# Patient Record
Sex: Female | Born: 1935 | Race: Black or African American | Hispanic: No | Marital: Married | Smoking: Never smoker
Health system: Southern US, Community
[De-identification: ages and names within clinical notes are randomized; demographics above are authoritative.]

## PROBLEM LIST (undated history)

## (undated) DIAGNOSIS — T783XXA Angioneurotic edema, initial encounter: Secondary | ICD-10-CM

## (undated) DIAGNOSIS — R0602 Shortness of breath: Secondary | ICD-10-CM

## (undated) DIAGNOSIS — I495 Sick sinus syndrome: Secondary | ICD-10-CM

## (undated) DIAGNOSIS — E041 Nontoxic single thyroid nodule: Secondary | ICD-10-CM

## (undated) DIAGNOSIS — I639 Cerebral infarction, unspecified: Secondary | ICD-10-CM

## (undated) DIAGNOSIS — M1990 Unspecified osteoarthritis, unspecified site: Secondary | ICD-10-CM

## (undated) DIAGNOSIS — I10 Essential (primary) hypertension: Secondary | ICD-10-CM

## (undated) DIAGNOSIS — IMO0001 Reserved for inherently not codable concepts without codable children: Secondary | ICD-10-CM

## (undated) DIAGNOSIS — Z5189 Encounter for other specified aftercare: Secondary | ICD-10-CM

## (undated) DIAGNOSIS — G40909 Epilepsy, unspecified, not intractable, without status epilepticus: Secondary | ICD-10-CM

## (undated) DIAGNOSIS — H3560 Retinal hemorrhage, unspecified eye: Secondary | ICD-10-CM

## (undated) DIAGNOSIS — N200 Calculus of kidney: Secondary | ICD-10-CM

## (undated) DIAGNOSIS — N189 Chronic kidney disease, unspecified: Secondary | ICD-10-CM

## (undated) DIAGNOSIS — I4891 Unspecified atrial fibrillation: Secondary | ICD-10-CM

## (undated) DIAGNOSIS — R42 Dizziness and giddiness: Secondary | ICD-10-CM

## (undated) DIAGNOSIS — D649 Anemia, unspecified: Secondary | ICD-10-CM

## (undated) DIAGNOSIS — Z950 Presence of cardiac pacemaker: Secondary | ICD-10-CM

## (undated) DIAGNOSIS — I059 Rheumatic mitral valve disease, unspecified: Secondary | ICD-10-CM

## (undated) DIAGNOSIS — E785 Hyperlipidemia, unspecified: Secondary | ICD-10-CM

## (undated) HISTORY — DX: Sick sinus syndrome: I49.5

## (undated) HISTORY — DX: Unspecified atrial fibrillation: I48.91

## (undated) HISTORY — DX: Angioneurotic edema, initial encounter: T78.3XXA

## (undated) HISTORY — DX: Unspecified osteoarthritis, unspecified site: M19.90

## (undated) HISTORY — DX: Hyperlipidemia, unspecified: E78.5

## (undated) HISTORY — DX: Anemia, unspecified: D64.9

## (undated) HISTORY — DX: Dizziness and giddiness: R42

## (undated) HISTORY — DX: Epilepsy, unspecified, not intractable, without status epilepticus: G40.909

## (undated) HISTORY — DX: Chronic kidney disease, unspecified: N18.9

## (undated) HISTORY — DX: Essential (primary) hypertension: I10

## (undated) HISTORY — DX: Cerebral infarction, unspecified: I63.9

## (undated) HISTORY — DX: Retinal hemorrhage, unspecified eye: H35.60

## (undated) HISTORY — DX: Calculus of kidney: N20.0

## (undated) HISTORY — DX: Rheumatic mitral valve disease, unspecified: I05.9

---
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
## 2001-10-30 ENCOUNTER — Encounter: Payer: Self-pay | Admitting: Emergency Medicine

## 2001-10-30 ENCOUNTER — Emergency Department (HOSPITAL_COMMUNITY): Admission: EM | Admit: 2001-10-30 | Discharge: 2001-10-30 | Payer: Self-pay | Admitting: Emergency Medicine

## 2003-07-29 ENCOUNTER — Emergency Department (HOSPITAL_COMMUNITY): Admission: EM | Admit: 2003-07-29 | Discharge: 2003-07-29 | Payer: Self-pay | Admitting: Emergency Medicine

## 2004-06-07 DIAGNOSIS — I639 Cerebral infarction, unspecified: Secondary | ICD-10-CM

## 2004-06-07 HISTORY — DX: Cerebral infarction, unspecified: I63.9

## 2004-11-16 ENCOUNTER — Ambulatory Visit (HOSPITAL_COMMUNITY): Admission: RE | Admit: 2004-11-16 | Discharge: 2004-11-16 | Payer: Self-pay | Admitting: Family Medicine

## 2004-11-28 ENCOUNTER — Inpatient Hospital Stay (HOSPITAL_COMMUNITY): Admission: EM | Admit: 2004-11-28 | Discharge: 2004-12-03 | Payer: Self-pay | Admitting: Emergency Medicine

## 2004-12-02 ENCOUNTER — Ambulatory Visit: Payer: Self-pay | Admitting: *Deleted

## 2004-12-17 ENCOUNTER — Ambulatory Visit: Payer: Self-pay | Admitting: *Deleted

## 2005-01-13 ENCOUNTER — Ambulatory Visit: Payer: Self-pay | Admitting: *Deleted

## 2005-02-24 ENCOUNTER — Ambulatory Visit: Payer: Self-pay | Admitting: *Deleted

## 2005-03-10 ENCOUNTER — Ambulatory Visit: Payer: Self-pay | Admitting: *Deleted

## 2005-03-17 ENCOUNTER — Ambulatory Visit: Payer: Self-pay | Admitting: *Deleted

## 2005-03-19 ENCOUNTER — Ambulatory Visit (HOSPITAL_COMMUNITY): Admission: RE | Admit: 2005-03-19 | Discharge: 2005-03-19 | Payer: Self-pay | Admitting: Family Medicine

## 2005-03-22 ENCOUNTER — Encounter (HOSPITAL_COMMUNITY): Admission: RE | Admit: 2005-03-22 | Discharge: 2005-04-21 | Payer: Self-pay | Admitting: *Deleted

## 2005-03-22 ENCOUNTER — Ambulatory Visit: Payer: Self-pay | Admitting: Cardiology

## 2005-03-31 ENCOUNTER — Ambulatory Visit: Payer: Self-pay | Admitting: *Deleted

## 2005-05-05 ENCOUNTER — Ambulatory Visit: Payer: Self-pay | Admitting: *Deleted

## 2005-05-21 ENCOUNTER — Emergency Department (HOSPITAL_COMMUNITY): Admission: EM | Admit: 2005-05-21 | Discharge: 2005-05-21 | Payer: Self-pay | Admitting: Emergency Medicine

## 2005-06-04 ENCOUNTER — Ambulatory Visit: Payer: Self-pay | Admitting: Cardiology

## 2005-07-06 ENCOUNTER — Ambulatory Visit: Payer: Self-pay | Admitting: *Deleted

## 2005-08-06 ENCOUNTER — Ambulatory Visit: Payer: Self-pay | Admitting: *Deleted

## 2005-09-09 ENCOUNTER — Ambulatory Visit: Payer: Self-pay | Admitting: *Deleted

## 2005-09-17 ENCOUNTER — Ambulatory Visit: Payer: Self-pay | Admitting: Cardiology

## 2005-09-17 ENCOUNTER — Ambulatory Visit (HOSPITAL_COMMUNITY): Admission: RE | Admit: 2005-09-17 | Discharge: 2005-09-17 | Payer: Self-pay | Admitting: *Deleted

## 2005-10-13 ENCOUNTER — Ambulatory Visit: Payer: Self-pay | Admitting: *Deleted

## 2005-11-16 ENCOUNTER — Ambulatory Visit: Payer: Self-pay | Admitting: *Deleted

## 2005-12-15 ENCOUNTER — Ambulatory Visit: Payer: Self-pay | Admitting: Cardiology

## 2005-12-20 ENCOUNTER — Ambulatory Visit (HOSPITAL_COMMUNITY): Admission: RE | Admit: 2005-12-20 | Discharge: 2005-12-20 | Payer: Self-pay | Admitting: Family Medicine

## 2005-12-24 ENCOUNTER — Ambulatory Visit: Payer: Self-pay | Admitting: *Deleted

## 2006-01-11 ENCOUNTER — Ambulatory Visit: Payer: Self-pay | Admitting: *Deleted

## 2006-02-16 ENCOUNTER — Ambulatory Visit: Payer: Self-pay | Admitting: Cardiology

## 2006-03-16 ENCOUNTER — Ambulatory Visit: Payer: Self-pay | Admitting: Cardiology

## 2006-03-18 ENCOUNTER — Emergency Department (HOSPITAL_COMMUNITY): Admission: EM | Admit: 2006-03-18 | Discharge: 2006-03-18 | Payer: Self-pay | Admitting: Emergency Medicine

## 2006-04-19 ENCOUNTER — Ambulatory Visit: Payer: Self-pay | Admitting: Cardiology

## 2006-05-06 ENCOUNTER — Ambulatory Visit: Payer: Self-pay | Admitting: Cardiology

## 2006-06-03 ENCOUNTER — Ambulatory Visit: Payer: Self-pay | Admitting: Cardiology

## 2006-06-24 ENCOUNTER — Ambulatory Visit: Payer: Self-pay | Admitting: Internal Medicine

## 2006-07-29 ENCOUNTER — Ambulatory Visit: Payer: Self-pay | Admitting: Cardiology

## 2006-08-06 DIAGNOSIS — T783XXA Angioneurotic edema, initial encounter: Secondary | ICD-10-CM

## 2006-08-06 HISTORY — DX: Angioneurotic edema, initial encounter: T78.3XXA

## 2006-08-30 ENCOUNTER — Ambulatory Visit: Payer: Self-pay | Admitting: *Deleted

## 2006-09-05 ENCOUNTER — Emergency Department (HOSPITAL_COMMUNITY): Admission: EM | Admit: 2006-09-05 | Discharge: 2006-09-05 | Payer: Self-pay | Admitting: Emergency Medicine

## 2006-09-09 ENCOUNTER — Ambulatory Visit: Payer: Self-pay | Admitting: Cardiology

## 2006-10-05 ENCOUNTER — Telehealth (INDEPENDENT_AMBULATORY_CARE_PROVIDER_SITE_OTHER): Payer: Self-pay | Admitting: *Deleted

## 2006-10-07 ENCOUNTER — Ambulatory Visit: Payer: Self-pay | Admitting: Cardiology

## 2006-11-08 ENCOUNTER — Ambulatory Visit: Payer: Self-pay | Admitting: Cardiology

## 2006-11-29 ENCOUNTER — Ambulatory Visit: Payer: Self-pay | Admitting: Cardiology

## 2006-12-29 ENCOUNTER — Ambulatory Visit: Payer: Self-pay | Admitting: Cardiology

## 2007-01-11 ENCOUNTER — Ambulatory Visit: Payer: Self-pay | Admitting: Cardiology

## 2007-01-16 ENCOUNTER — Ambulatory Visit (HOSPITAL_COMMUNITY): Admission: RE | Admit: 2007-01-16 | Discharge: 2007-01-16 | Payer: Self-pay | Admitting: Family Medicine

## 2007-01-26 ENCOUNTER — Ambulatory Visit: Payer: Self-pay | Admitting: Cardiology

## 2007-02-16 ENCOUNTER — Ambulatory Visit: Payer: Self-pay | Admitting: Cardiology

## 2007-03-17 ENCOUNTER — Ambulatory Visit: Payer: Self-pay | Admitting: Internal Medicine

## 2007-04-12 ENCOUNTER — Ambulatory Visit: Payer: Self-pay | Admitting: Cardiology

## 2007-04-12 ENCOUNTER — Ambulatory Visit (HOSPITAL_COMMUNITY): Admission: RE | Admit: 2007-04-12 | Discharge: 2007-04-12 | Payer: Self-pay | Admitting: Family Medicine

## 2007-04-20 ENCOUNTER — Ambulatory Visit (HOSPITAL_COMMUNITY): Admission: RE | Admit: 2007-04-20 | Discharge: 2007-04-20 | Payer: Self-pay | Admitting: Cardiology

## 2007-04-20 ENCOUNTER — Ambulatory Visit: Payer: Self-pay | Admitting: Cardiology

## 2007-05-01 ENCOUNTER — Ambulatory Visit: Payer: Self-pay | Admitting: Cardiology

## 2007-05-18 ENCOUNTER — Ambulatory Visit: Payer: Self-pay | Admitting: Internal Medicine

## 2007-06-09 ENCOUNTER — Ambulatory Visit: Payer: Self-pay | Admitting: Cardiology

## 2007-07-07 ENCOUNTER — Ambulatory Visit: Payer: Self-pay | Admitting: Internal Medicine

## 2007-07-10 ENCOUNTER — Ambulatory Visit: Payer: Self-pay | Admitting: Cardiology

## 2007-07-14 ENCOUNTER — Ambulatory Visit: Payer: Self-pay | Admitting: Cardiology

## 2007-10-24 ENCOUNTER — Ambulatory Visit: Payer: Self-pay | Admitting: Cardiology

## 2007-11-26 ENCOUNTER — Emergency Department (HOSPITAL_COMMUNITY): Admission: EM | Admit: 2007-11-26 | Discharge: 2007-11-26 | Payer: Self-pay | Admitting: Emergency Medicine

## 2007-11-27 ENCOUNTER — Ambulatory Visit: Payer: Self-pay | Admitting: Cardiology

## 2007-11-27 ENCOUNTER — Ambulatory Visit (HOSPITAL_COMMUNITY): Admission: RE | Admit: 2007-11-27 | Discharge: 2007-11-27 | Payer: Self-pay | Admitting: Cardiology

## 2007-11-27 ENCOUNTER — Encounter: Payer: Self-pay | Admitting: Cardiology

## 2007-12-21 ENCOUNTER — Ambulatory Visit: Payer: Self-pay | Admitting: Cardiology

## 2008-02-19 ENCOUNTER — Ambulatory Visit: Payer: Self-pay | Admitting: Cardiology

## 2008-04-04 ENCOUNTER — Emergency Department (HOSPITAL_COMMUNITY): Admission: EM | Admit: 2008-04-04 | Discharge: 2008-04-04 | Payer: Self-pay | Admitting: Emergency Medicine

## 2008-04-12 ENCOUNTER — Ambulatory Visit: Payer: Self-pay | Admitting: Cardiology

## 2008-05-10 ENCOUNTER — Ambulatory Visit: Payer: Self-pay | Admitting: Cardiology

## 2008-05-17 ENCOUNTER — Ambulatory Visit: Payer: Self-pay | Admitting: Cardiology

## 2008-09-16 ENCOUNTER — Ambulatory Visit: Payer: Self-pay | Admitting: Cardiology

## 2008-09-17 ENCOUNTER — Ambulatory Visit (HOSPITAL_COMMUNITY): Admission: RE | Admit: 2008-09-17 | Discharge: 2008-09-17 | Payer: Self-pay | Admitting: Cardiology

## 2008-09-30 ENCOUNTER — Ambulatory Visit: Payer: Self-pay | Admitting: Cardiology

## 2008-10-04 ENCOUNTER — Ambulatory Visit: Payer: Self-pay | Admitting: Cardiology

## 2008-10-04 ENCOUNTER — Inpatient Hospital Stay (HOSPITAL_COMMUNITY): Admission: EM | Admit: 2008-10-04 | Discharge: 2008-10-09 | Payer: Self-pay | Admitting: Emergency Medicine

## 2008-10-04 ENCOUNTER — Encounter (INDEPENDENT_AMBULATORY_CARE_PROVIDER_SITE_OTHER): Payer: Self-pay | Admitting: Internal Medicine

## 2008-10-07 ENCOUNTER — Ambulatory Visit: Payer: Self-pay | Admitting: Gastroenterology

## 2008-10-08 ENCOUNTER — Ambulatory Visit: Payer: Self-pay | Admitting: Internal Medicine

## 2008-10-09 ENCOUNTER — Ambulatory Visit: Payer: Self-pay | Admitting: Internal Medicine

## 2008-10-10 ENCOUNTER — Telehealth (INDEPENDENT_AMBULATORY_CARE_PROVIDER_SITE_OTHER): Payer: Self-pay | Admitting: *Deleted

## 2008-10-31 ENCOUNTER — Ambulatory Visit: Payer: Self-pay | Admitting: Gastroenterology

## 2008-10-31 DIAGNOSIS — R1032 Left lower quadrant pain: Secondary | ICD-10-CM | POA: Insufficient documentation

## 2008-10-31 DIAGNOSIS — R109 Unspecified abdominal pain: Secondary | ICD-10-CM | POA: Insufficient documentation

## 2008-10-31 DIAGNOSIS — R569 Unspecified convulsions: Secondary | ICD-10-CM | POA: Insufficient documentation

## 2008-10-31 DIAGNOSIS — K5900 Constipation, unspecified: Secondary | ICD-10-CM | POA: Insufficient documentation

## 2008-10-31 DIAGNOSIS — D649 Anemia, unspecified: Secondary | ICD-10-CM | POA: Insufficient documentation

## 2008-11-01 ENCOUNTER — Encounter (INDEPENDENT_AMBULATORY_CARE_PROVIDER_SITE_OTHER): Payer: Self-pay | Admitting: *Deleted

## 2008-11-03 ENCOUNTER — Inpatient Hospital Stay (HOSPITAL_COMMUNITY): Admission: EM | Admit: 2008-11-03 | Discharge: 2008-11-07 | Payer: Self-pay | Admitting: Emergency Medicine

## 2008-11-03 ENCOUNTER — Ambulatory Visit: Payer: Self-pay | Admitting: Cardiology

## 2008-11-05 HISTORY — PX: INSERT / REPLACE / REMOVE PACEMAKER: SUR710

## 2008-11-06 ENCOUNTER — Ambulatory Visit: Payer: Self-pay | Admitting: Internal Medicine

## 2008-11-07 ENCOUNTER — Encounter: Payer: Self-pay | Admitting: Internal Medicine

## 2008-11-11 ENCOUNTER — Encounter (INDEPENDENT_AMBULATORY_CARE_PROVIDER_SITE_OTHER): Payer: Self-pay | Admitting: *Deleted

## 2008-11-15 ENCOUNTER — Encounter: Payer: Self-pay | Admitting: Gastroenterology

## 2008-11-25 ENCOUNTER — Encounter: Payer: Self-pay | Admitting: Internal Medicine

## 2008-11-25 ENCOUNTER — Ambulatory Visit: Payer: Self-pay

## 2008-11-26 DIAGNOSIS — E782 Mixed hyperlipidemia: Secondary | ICD-10-CM | POA: Insufficient documentation

## 2008-11-26 DIAGNOSIS — T783XXA Angioneurotic edema, initial encounter: Secondary | ICD-10-CM | POA: Insufficient documentation

## 2008-11-26 DIAGNOSIS — E785 Hyperlipidemia, unspecified: Secondary | ICD-10-CM | POA: Insufficient documentation

## 2008-11-28 ENCOUNTER — Ambulatory Visit: Payer: Self-pay | Admitting: Cardiology

## 2008-12-17 ENCOUNTER — Encounter: Payer: Self-pay | Admitting: Cardiology

## 2008-12-17 ENCOUNTER — Encounter (INDEPENDENT_AMBULATORY_CARE_PROVIDER_SITE_OTHER): Payer: Self-pay | Admitting: *Deleted

## 2008-12-17 LAB — CONVERTED CEMR LAB
ALT: 31 units/L
AST: 29 units/L
Alkaline Phosphatase: 79 units/L (ref 39–117)
BUN: 12 mg/dL
BUN: 12 mg/dL (ref 6–23)
Basophils Absolute: 0 10*3/uL (ref 0.0–0.1)
Basophils Relative: 0 % (ref 0–1)
CO2: 24 meq/L (ref 19–32)
Calcium: 9.5 mg/dL
Creatinine, Ser: 1.26 mg/dL
Creatinine, Ser: 1.26 mg/dL — ABNORMAL HIGH (ref 0.40–1.20)
Eosinophils Absolute: 0.1 10*3/uL (ref 0.0–0.7)
Eosinophils Relative: 2 % (ref 0–5)
Glucose, Bld: 88 mg/dL
Glucose, Bld: 88 mg/dL (ref 70–99)
Hemoglobin: 11.3 g/dL — ABNORMAL LOW (ref 12.0–15.0)
MCHC: 30.8 g/dL (ref 30.0–36.0)
MCV: 94.8 fL (ref 78.0–100.0)
Monocytes Absolute: 0.5 10*3/uL (ref 0.1–1.0)
Monocytes Relative: 11 % (ref 3–12)
Neutro Abs: 2.2 10*3/uL (ref 1.7–7.7)
Potassium: 3.8 meq/L
RBC: 3.87 M/uL (ref 3.87–5.11)
RDW: 14.3 % (ref 11.5–15.5)
Sodium: 145 meq/L (ref 135–145)
Total Bilirubin: 0.3 mg/dL (ref 0.3–1.2)
Total Protein: 6.2 g/dL (ref 6.0–8.3)

## 2008-12-18 ENCOUNTER — Telehealth (INDEPENDENT_AMBULATORY_CARE_PROVIDER_SITE_OTHER): Payer: Self-pay

## 2009-01-01 ENCOUNTER — Ambulatory Visit: Payer: Self-pay | Admitting: Cardiology

## 2009-01-24 ENCOUNTER — Encounter: Payer: Self-pay | Admitting: Cardiology

## 2009-01-24 LAB — CONVERTED CEMR LAB
OCCULT 1: NEGATIVE
OCCULT 2: NEGATIVE
OCCULT 3: NEGATIVE

## 2009-02-21 ENCOUNTER — Ambulatory Visit: Payer: Self-pay | Admitting: Internal Medicine

## 2009-02-21 DIAGNOSIS — I495 Sick sinus syndrome: Secondary | ICD-10-CM | POA: Insufficient documentation

## 2009-02-21 DIAGNOSIS — I10 Essential (primary) hypertension: Secondary | ICD-10-CM | POA: Insufficient documentation

## 2009-02-21 DIAGNOSIS — I498 Other specified cardiac arrhythmias: Secondary | ICD-10-CM | POA: Insufficient documentation

## 2009-02-25 ENCOUNTER — Encounter: Payer: Self-pay | Admitting: Orthopedic Surgery

## 2009-02-26 ENCOUNTER — Telehealth: Payer: Self-pay | Admitting: Internal Medicine

## 2009-02-26 ENCOUNTER — Ambulatory Visit: Payer: Self-pay | Admitting: Orthopedic Surgery

## 2009-02-27 ENCOUNTER — Ambulatory Visit: Payer: Self-pay | Admitting: Cardiology

## 2009-02-27 LAB — CONVERTED CEMR LAB: POC INR: 1.4

## 2009-03-05 ENCOUNTER — Ambulatory Visit: Payer: Self-pay | Admitting: Cardiology

## 2009-03-05 LAB — CONVERTED CEMR LAB: POC INR: 2.6

## 2009-03-12 ENCOUNTER — Ambulatory Visit: Payer: Self-pay | Admitting: Cardiology

## 2009-03-12 LAB — CONVERTED CEMR LAB: POC INR: 2.5

## 2009-03-18 ENCOUNTER — Telehealth: Payer: Self-pay | Admitting: Orthopedic Surgery

## 2009-03-18 ENCOUNTER — Encounter: Payer: Self-pay | Admitting: Orthopedic Surgery

## 2009-03-24 ENCOUNTER — Encounter: Payer: Self-pay | Admitting: Orthopedic Surgery

## 2009-03-24 ENCOUNTER — Telehealth: Payer: Self-pay | Admitting: Internal Medicine

## 2009-03-28 ENCOUNTER — Ambulatory Visit: Payer: Self-pay | Admitting: Cardiology

## 2009-04-03 ENCOUNTER — Ambulatory Visit: Payer: Self-pay | Admitting: Orthopedic Surgery

## 2009-04-07 ENCOUNTER — Ambulatory Visit: Payer: Self-pay | Admitting: Cardiology

## 2009-04-07 HISTORY — PX: TOTAL KNEE ARTHROPLASTY: SHX125

## 2009-04-07 LAB — CONVERTED CEMR LAB: POC INR: 2.1

## 2009-04-18 ENCOUNTER — Encounter (INDEPENDENT_AMBULATORY_CARE_PROVIDER_SITE_OTHER): Payer: Self-pay | Admitting: *Deleted

## 2009-04-22 ENCOUNTER — Ambulatory Visit: Payer: Self-pay | Admitting: Orthopedic Surgery

## 2009-04-22 ENCOUNTER — Inpatient Hospital Stay (HOSPITAL_COMMUNITY): Admission: RE | Admit: 2009-04-22 | Discharge: 2009-04-26 | Payer: Self-pay | Admitting: Orthopedic Surgery

## 2009-04-25 ENCOUNTER — Telehealth: Payer: Self-pay | Admitting: Orthopedic Surgery

## 2009-04-26 ENCOUNTER — Inpatient Hospital Stay: Admission: AD | Admit: 2009-04-26 | Discharge: 2009-04-27 | Payer: Self-pay | Admitting: Internal Medicine

## 2009-04-27 ENCOUNTER — Inpatient Hospital Stay (HOSPITAL_COMMUNITY): Admission: EM | Admit: 2009-04-27 | Discharge: 2009-05-02 | Payer: Self-pay | Admitting: Emergency Medicine

## 2009-05-01 ENCOUNTER — Encounter (INDEPENDENT_AMBULATORY_CARE_PROVIDER_SITE_OTHER): Payer: Self-pay | Admitting: *Deleted

## 2009-05-01 LAB — CONVERTED CEMR LAB
BUN: 21 mg/dL
Chloride: 104 meq/L
Creatinine, Ser: 0.87 mg/dL
Glucose, Bld: 184 mg/dL
Potassium: 3.5 meq/L

## 2009-05-02 ENCOUNTER — Encounter (INDEPENDENT_AMBULATORY_CARE_PROVIDER_SITE_OTHER): Payer: Self-pay | Admitting: *Deleted

## 2009-05-02 LAB — CONVERTED CEMR LAB
CO2: 31 meq/L
Calcium: 9.6 mg/dL
Chloride: 106 meq/L
Creatinine, Ser: 0.84 mg/dL
GFR calc non Af Amer: >60 mL/min
Glomerular Filtration Rate, Af Am: >60 mL/min/{1.73_m2}
Glucose, Bld: 87 mg/dL
HCT: 31.1 %
Hemoglobin: 10.8 g/dL
Platelets: 349 10*3/uL
Potassium: 3.9 meq/L
Sodium: 140 meq/L

## 2009-05-05 ENCOUNTER — Telehealth: Payer: Self-pay | Admitting: Internal Medicine

## 2009-05-06 ENCOUNTER — Ambulatory Visit: Payer: Self-pay | Admitting: Orthopedic Surgery

## 2009-05-06 DIAGNOSIS — Z96659 Presence of unspecified artificial knee joint: Secondary | ICD-10-CM | POA: Insufficient documentation

## 2009-06-04 ENCOUNTER — Encounter (INDEPENDENT_AMBULATORY_CARE_PROVIDER_SITE_OTHER): Payer: Self-pay | Admitting: *Deleted

## 2009-06-05 ENCOUNTER — Encounter (INDEPENDENT_AMBULATORY_CARE_PROVIDER_SITE_OTHER): Payer: Self-pay | Admitting: *Deleted

## 2009-06-05 ENCOUNTER — Ambulatory Visit: Payer: Self-pay | Admitting: Cardiology

## 2009-06-09 ENCOUNTER — Encounter: Payer: Self-pay | Admitting: Cardiology

## 2009-06-09 ENCOUNTER — Encounter: Payer: Self-pay | Admitting: Orthopedic Surgery

## 2009-06-09 LAB — CONVERTED CEMR LAB
Basophils Relative: 0 % (ref 0–1)
Lymphocytes Relative: 20 % (ref 12–46)
Lymphs Abs: 1.8 10*3/uL (ref 0.7–4.0)
Monocytes Relative: 11 % (ref 3–12)
Neutro Abs: 6.3 10*3/uL (ref 1.7–7.7)
Neutrophils Relative %: 69 % (ref 43–77)
RBC: 4.61 M/uL (ref 3.87–5.11)
WBC: 9.1 10*3/uL (ref 4.0–10.5)

## 2009-06-11 ENCOUNTER — Ambulatory Visit: Payer: Self-pay | Admitting: Orthopedic Surgery

## 2009-06-30 ENCOUNTER — Encounter: Payer: Self-pay | Admitting: Cardiology

## 2009-06-30 ENCOUNTER — Encounter: Payer: Self-pay | Admitting: Orthopedic Surgery

## 2009-06-30 ENCOUNTER — Ambulatory Visit: Payer: Self-pay | Admitting: Cardiology

## 2009-06-30 ENCOUNTER — Encounter (INDEPENDENT_AMBULATORY_CARE_PROVIDER_SITE_OTHER): Payer: Self-pay | Admitting: *Deleted

## 2009-07-01 ENCOUNTER — Encounter: Payer: Self-pay | Admitting: Cardiology

## 2009-07-23 ENCOUNTER — Ambulatory Visit: Payer: Self-pay | Admitting: Orthopedic Surgery

## 2009-08-28 LAB — CONVERTED CEMR LAB
ALT: 16 units/L (ref 0–35)
CO2: 26 meq/L (ref 19–32)
Chloride: 102 meq/L (ref 96–112)
Potassium: 4.1 meq/L (ref 3.5–5.3)
Sodium: 141 meq/L (ref 135–145)
Total Bilirubin: 0.6 mg/dL (ref 0.3–1.2)
Total Protein: 6.9 g/dL (ref 6.0–8.3)

## 2009-09-02 ENCOUNTER — Encounter (INDEPENDENT_AMBULATORY_CARE_PROVIDER_SITE_OTHER): Payer: Self-pay | Admitting: *Deleted

## 2009-09-11 ENCOUNTER — Encounter (INDEPENDENT_AMBULATORY_CARE_PROVIDER_SITE_OTHER): Payer: Self-pay | Admitting: *Deleted

## 2009-09-11 LAB — CONVERTED CEMR LAB
Albumin: 3.9 g/dL
BUN: 12 mg/dL
Chloride: 100 meq/L
Glomerular Filtration Rate, Af Am: >60 mL/min/{1.73_m2}
Glucose, Bld: 86 mg/dL
HCT: 43.2 %
Potassium: 4 meq/L
TSH: 1.131 microintl units/mL
Total Protein: 6.6 g/dL

## 2009-09-12 ENCOUNTER — Telehealth: Payer: Self-pay | Admitting: Cardiology

## 2009-09-15 ENCOUNTER — Telehealth: Payer: Self-pay | Admitting: Cardiology

## 2009-09-16 ENCOUNTER — Ambulatory Visit: Payer: Self-pay | Admitting: Cardiology

## 2009-09-16 LAB — CONVERTED CEMR LAB: POC INR: 7

## 2009-09-19 ENCOUNTER — Ambulatory Visit: Payer: Self-pay | Admitting: Cardiology

## 2009-09-25 ENCOUNTER — Ambulatory Visit: Payer: Self-pay | Admitting: Cardiology

## 2009-09-25 LAB — CONVERTED CEMR LAB: POC INR: 5.7

## 2009-10-01 ENCOUNTER — Ambulatory Visit: Payer: Self-pay | Admitting: Cardiology

## 2009-10-13 ENCOUNTER — Ambulatory Visit: Payer: Self-pay | Admitting: Cardiology

## 2009-10-30 ENCOUNTER — Ambulatory Visit: Payer: Self-pay | Admitting: Cardiology

## 2009-10-30 LAB — CONVERTED CEMR LAB: POC INR: 2.5

## 2009-11-02 IMAGING — CR DG CHEST 1V PORT
1 series · 1 of 1 positions shown · non-contrast
Comparison: 10/03/2008

CLINICAL DATA: Seizures

PORTABLE CHEST - 1 VIEW

[view not recorded]
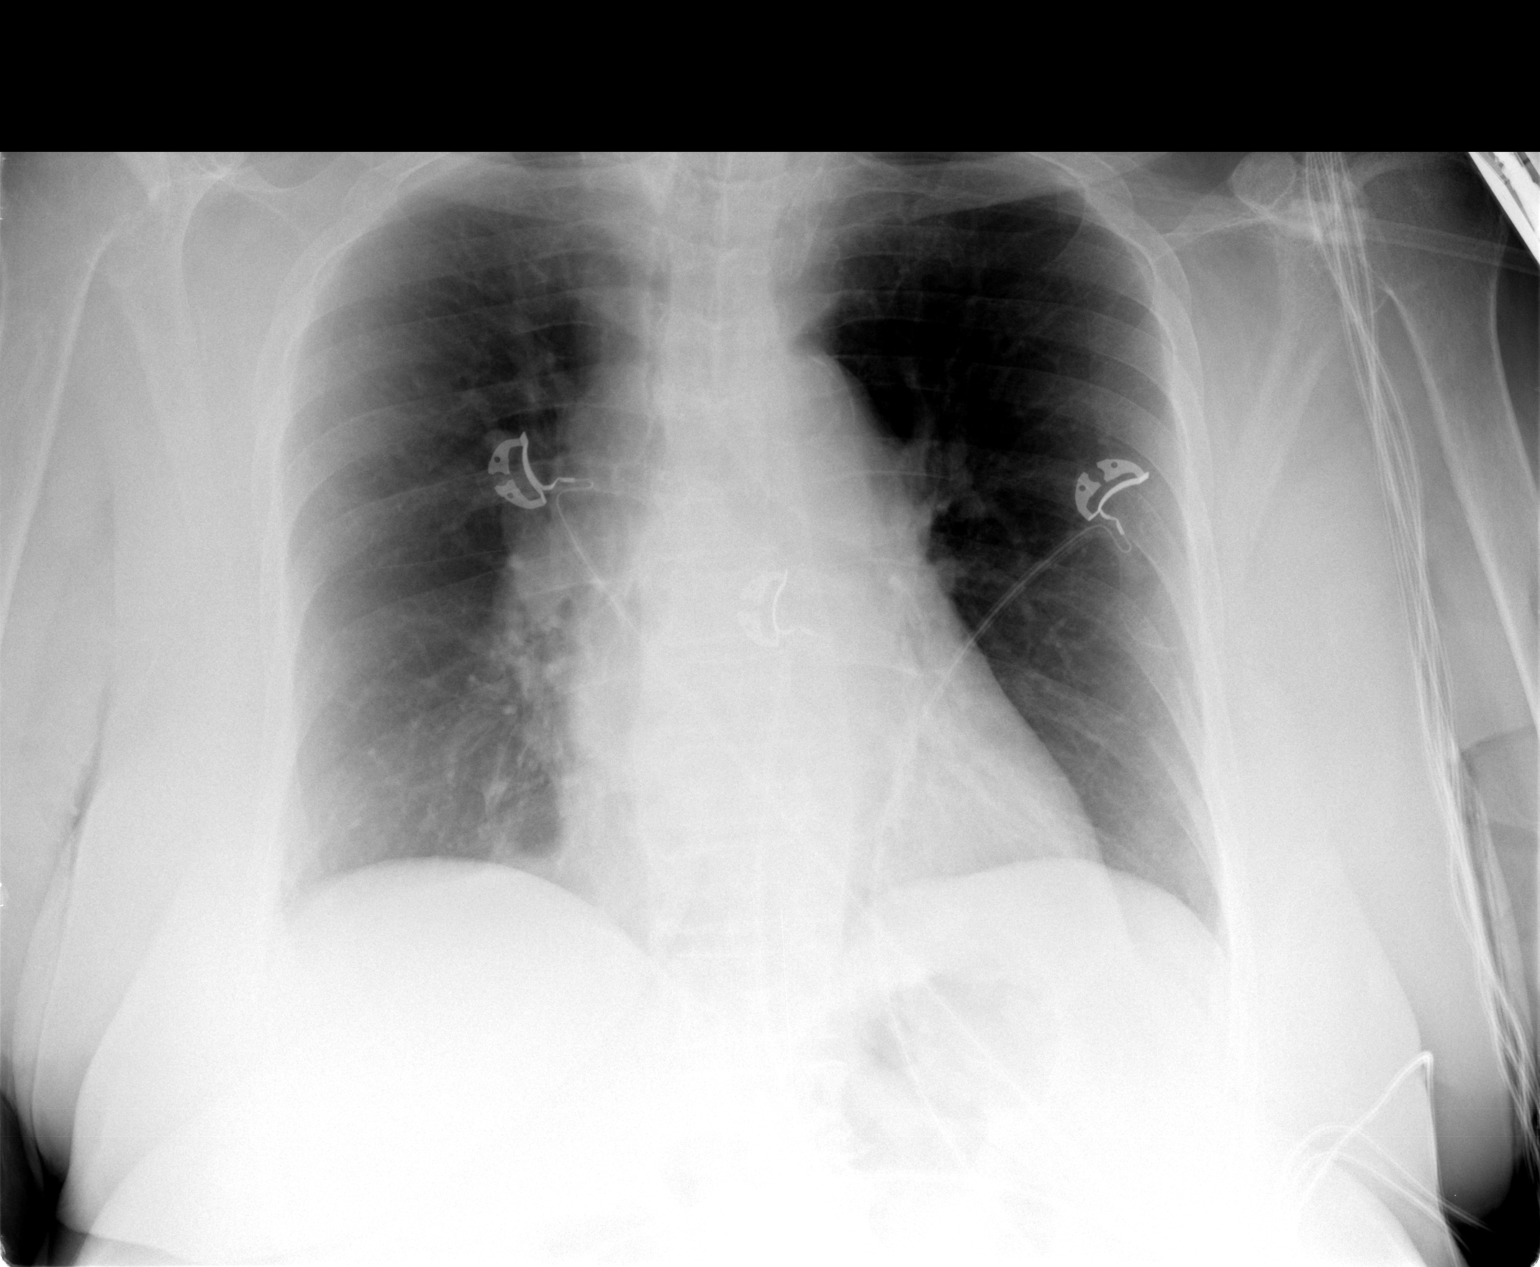

[1 of 1 positions shown; findings below may reference images not displayed]

FINDINGS: The lungs are clear.  Mild cardiac enlargement.
IMPRESSION: No acute cardiopulmonary process.  No interval change.

## 2009-11-10 ENCOUNTER — Ambulatory Visit: Payer: Self-pay | Admitting: Internal Medicine

## 2009-11-20 ENCOUNTER — Encounter (INDEPENDENT_AMBULATORY_CARE_PROVIDER_SITE_OTHER): Payer: Self-pay | Admitting: *Deleted

## 2009-11-24 ENCOUNTER — Telehealth: Payer: Self-pay | Admitting: Internal Medicine

## 2009-11-27 ENCOUNTER — Ambulatory Visit: Payer: Self-pay | Admitting: Cardiology

## 2009-12-01 ENCOUNTER — Ambulatory Visit: Payer: Self-pay | Admitting: Cardiology

## 2009-12-01 LAB — CONVERTED CEMR LAB: POC INR: 1.7

## 2009-12-24 ENCOUNTER — Encounter (INDEPENDENT_AMBULATORY_CARE_PROVIDER_SITE_OTHER): Payer: Self-pay | Admitting: *Deleted

## 2010-01-01 ENCOUNTER — Ambulatory Visit: Payer: Self-pay | Admitting: Cardiology

## 2010-01-01 LAB — CONVERTED CEMR LAB: POC INR: 1.7

## 2010-01-19 ENCOUNTER — Ambulatory Visit: Payer: Self-pay | Admitting: Cardiology

## 2010-02-18 ENCOUNTER — Encounter (INDEPENDENT_AMBULATORY_CARE_PROVIDER_SITE_OTHER): Payer: Self-pay | Admitting: *Deleted

## 2010-02-19 ENCOUNTER — Ambulatory Visit: Payer: Self-pay | Admitting: Cardiology

## 2010-02-25 ENCOUNTER — Telehealth (INDEPENDENT_AMBULATORY_CARE_PROVIDER_SITE_OTHER): Payer: Self-pay | Admitting: *Deleted

## 2010-02-27 ENCOUNTER — Encounter (INDEPENDENT_AMBULATORY_CARE_PROVIDER_SITE_OTHER): Payer: Self-pay | Admitting: *Deleted

## 2010-02-28 ENCOUNTER — Encounter: Payer: Self-pay | Admitting: Internal Medicine

## 2010-03-02 ENCOUNTER — Ambulatory Visit: Payer: Self-pay | Admitting: Internal Medicine

## 2010-03-11 ENCOUNTER — Ambulatory Visit: Payer: Self-pay | Admitting: Internal Medicine

## 2010-03-18 ENCOUNTER — Encounter: Payer: Self-pay | Admitting: Internal Medicine

## 2010-03-19 ENCOUNTER — Ambulatory Visit: Payer: Self-pay | Admitting: Cardiology

## 2010-03-30 ENCOUNTER — Ambulatory Visit: Payer: Self-pay | Admitting: Orthopedic Surgery

## 2010-03-30 DIAGNOSIS — M6530 Trigger finger, unspecified finger: Secondary | ICD-10-CM | POA: Insufficient documentation

## 2010-03-30 DIAGNOSIS — M7150 Other bursitis, not elsewhere classified, unspecified site: Secondary | ICD-10-CM | POA: Insufficient documentation

## 2010-03-30 LAB — CONVERTED CEMR LAB
ALT: 21 units/L (ref 0–35)
Albumin: 4.3 g/dL (ref 3.5–5.2)
Alkaline Phosphatase: 142 units/L — ABNORMAL HIGH (ref 39–117)
Basophils Absolute: 0 10*3/uL (ref 0.0–0.1)
CO2: 26 meq/L (ref 19–32)
Eosinophils Relative: 1 % (ref 0–5)
Glucose, Bld: 96 mg/dL (ref 70–99)
LDL Cholesterol: 50 mg/dL (ref 0–99)
Lymphocytes Relative: 30 % (ref 12–46)
Lymphs Abs: 2.5 10*3/uL (ref 0.7–4.0)
Neutrophils Relative %: 61 % (ref 43–77)
Platelets: 192 10*3/uL (ref 150–400)
Potassium: 4.3 meq/L (ref 3.5–5.3)
RDW: 14.1 % (ref 11.5–15.5)
Sodium: 143 meq/L (ref 135–145)
Total Bilirubin: 0.7 mg/dL (ref 0.3–1.2)
Total Protein: 7 g/dL (ref 6.0–8.3)
Triglycerides: 85 mg/dL (ref <150)
VLDL: 17 mg/dL (ref 0–40)

## 2010-04-02 ENCOUNTER — Ambulatory Visit: Payer: Self-pay | Admitting: Cardiology

## 2010-04-03 ENCOUNTER — Encounter: Payer: Self-pay | Admitting: Orthopedic Surgery

## 2010-04-07 ENCOUNTER — Encounter: Payer: Self-pay | Admitting: Orthopedic Surgery

## 2010-04-13 ENCOUNTER — Encounter (INDEPENDENT_AMBULATORY_CARE_PROVIDER_SITE_OTHER): Payer: Self-pay | Admitting: *Deleted

## 2010-04-13 LAB — CONVERTED CEMR LAB
OCCULT 1: NEGATIVE
OCCULT 2: NEGATIVE
OCCULT 3: NEGATIVE

## 2010-04-16 ENCOUNTER — Encounter (INDEPENDENT_AMBULATORY_CARE_PROVIDER_SITE_OTHER): Payer: Self-pay | Admitting: *Deleted

## 2010-04-27 ENCOUNTER — Ambulatory Visit: Payer: Self-pay | Admitting: Cardiology

## 2010-04-28 IMAGING — CR DG CHEST 1V PORT
1 series · 1 of 1 positions shown · non-contrast
Comparison: 04/27/2009

CLINICAL DATA: Fever.  PICC line placement.

PORTABLE CHEST - 1 VIEW

[view not recorded]
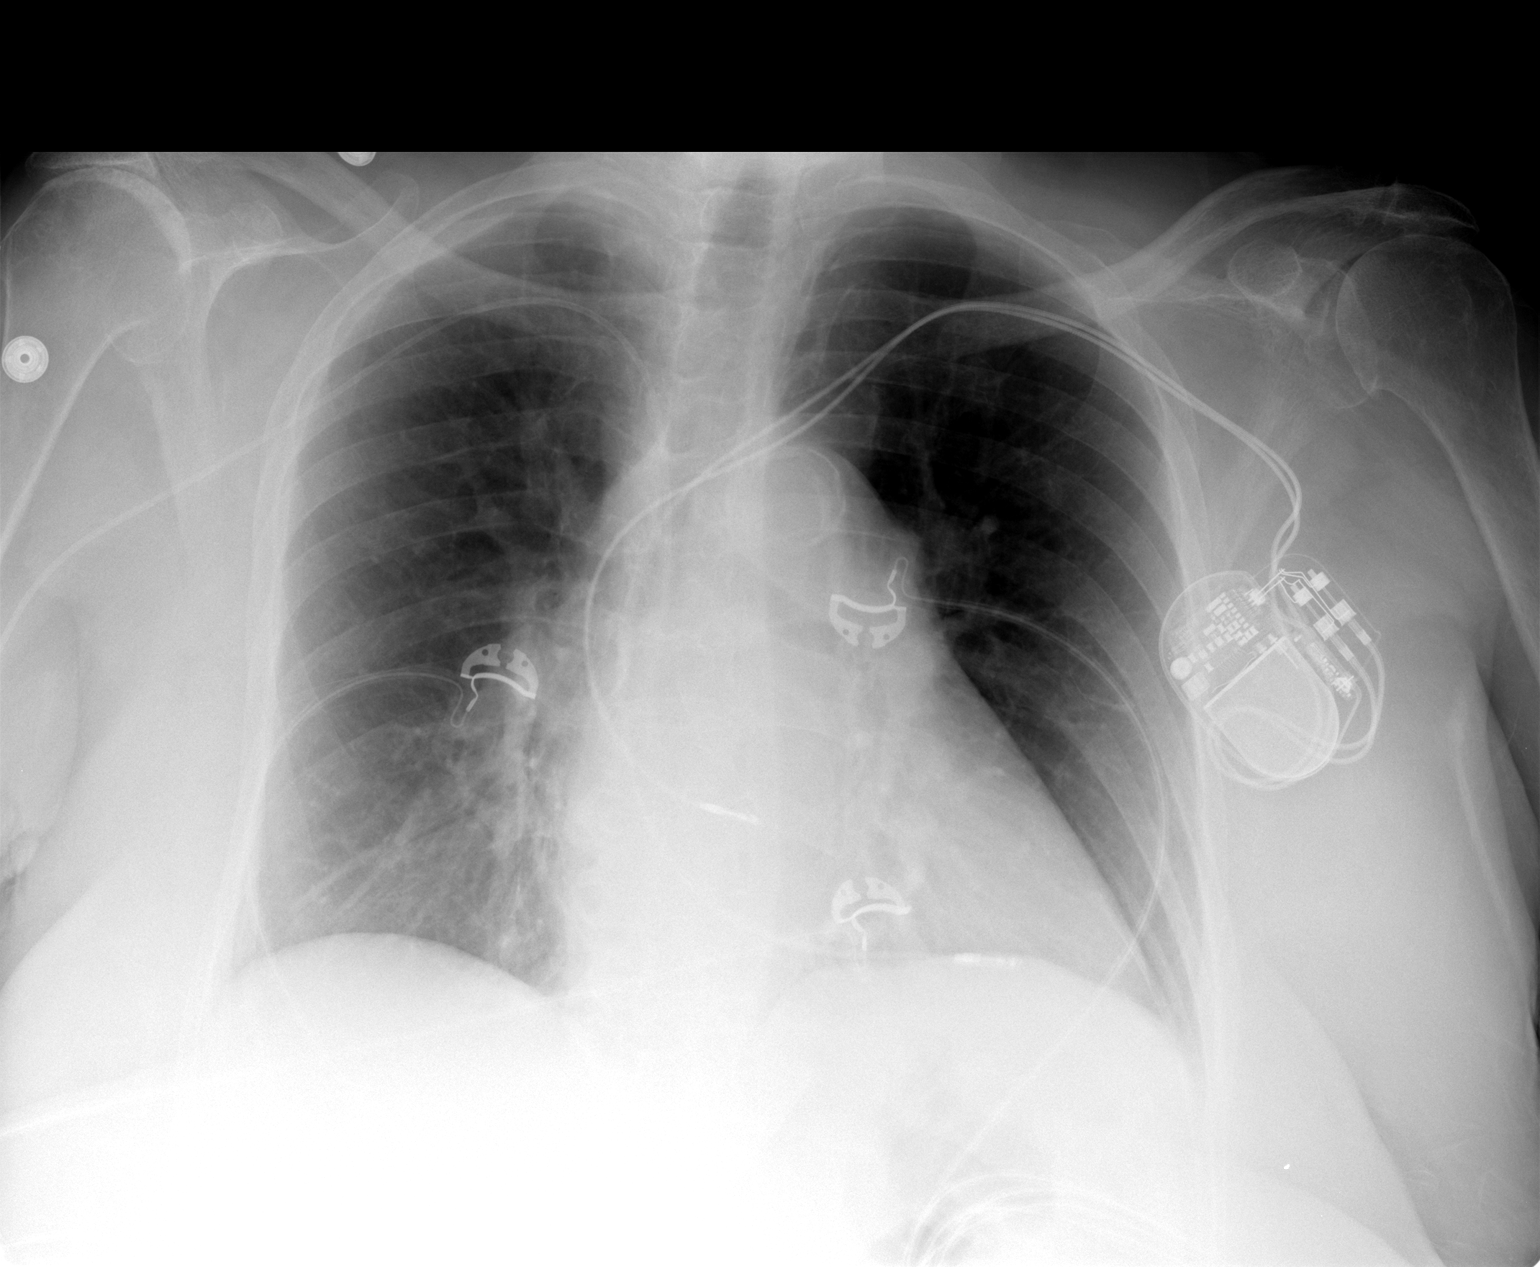

[1 of 1 positions shown; findings below may reference images not displayed]

FINDINGS: Right-sided PICC line has been inserted and the tip is 1
cm below the carina in the region of the superior vena cava.

There is mild pulmonary vascular prominence.  There is tortuosity
of the thoracic aorta.  The lungs are clear.
IMPRESSION: PICC line appears in good position 1 cm below the carina.  Mild
pulmonary vascular prominence.

## 2010-05-25 ENCOUNTER — Ambulatory Visit: Payer: Self-pay | Admitting: Cardiology

## 2010-05-25 LAB — CONVERTED CEMR LAB: POC INR: 4.9

## 2010-06-11 ENCOUNTER — Ambulatory Visit: Admission: RE | Admit: 2010-06-11 | Discharge: 2010-06-11 | Payer: Self-pay | Source: Home / Self Care

## 2010-06-11 LAB — CONVERTED CEMR LAB: POC INR: 2.3

## 2010-06-16 ENCOUNTER — Encounter (INDEPENDENT_AMBULATORY_CARE_PROVIDER_SITE_OTHER): Payer: Self-pay | Admitting: *Deleted

## 2010-06-17 ENCOUNTER — Ambulatory Visit
Admission: RE | Admit: 2010-06-17 | Discharge: 2010-06-17 | Payer: Self-pay | Source: Home / Self Care | Attending: Orthopedic Surgery | Admitting: Orthopedic Surgery

## 2010-06-21 ENCOUNTER — Encounter: Payer: Self-pay | Admitting: Internal Medicine

## 2010-06-22 ENCOUNTER — Ambulatory Visit
Admission: RE | Admit: 2010-06-22 | Discharge: 2010-06-22 | Payer: Self-pay | Source: Home / Self Care | Attending: Internal Medicine | Admitting: Internal Medicine

## 2010-07-07 NOTE — Miscellaneous (Signed)
Summary: PT Progress note  PT Progress note   Imported By: Ruffin Pyo 07/18/2009 10:41:36  _____________________________________________________________________  External Attachment:    Type:   Image     Comment:   External Document

## 2010-07-07 NOTE — Letter (Signed)
Summary: Remote Device Check  [place] [place]  [id number]   [place], [place] [address]   Phone: [phone]  Fax: [phone]     March 18, 2010 MRN: [id number]   [facility] [address] [place],   [address]   Dear Ms. Ebbert,   Your remote transmission was recieved and reviewed by your physician.  All diagnostics were within normal limits for you.  __X___Your next transmission is scheduled for:  06-11-2010.  Please transmit at any time this day.  If you have a wireless device your transmission will be sent automatically.   Sincerely,  Shelly Bombard

## 2010-07-07 NOTE — Miscellaneous (Signed)
Summary: Home care order   Home care order   Imported By: Ihor Austin 06/09/2009 14:25:07  _____________________________________________________________________  External Attachment:    Type:   Image     Comment:   External Document

## 2010-07-07 NOTE — Procedures (Signed)
Summary: PT TO SEE PAULA @ 1:30 TO GO OVER PHONE CHECK MACHINE/TG    Allergies: No Known Drug Allergies   PPM Specifications Following MD:  Thompson Grayer, MD     PPM Vendor:  [employer]     PPM Model Number:  [id number]     PPM Serial Number:  [id number] PPM DOI:  11/06/2008     PPM Implanting MD:  Thompson Grayer, MD  Lead 1    Location: RA     DOI: 11/06/2008     Model #: [id number]     Serial #: [id number]     Status: active Lead 2    Location: RV     DOI: 11/06/2008     Model #: [id number]     Serial #: [id number]     Status: active  Magnet Response Rate:  BOL 100 ERI  85    PPM Follow Up Remote Check?  No Battery Voltage:  2.96 V     Battery Est. Longevity:  8.6 years     Pacer Dependent:  No       PPM Device Measurements Atrium  Amplitude: 0.7 mV, Impedance: 430 ohms,  Right Ventricle  Amplitude: 12 mV, Impedance: 460 ohms, Threshold: 0.75 V at 0.4 msec  Episodes MS Episodes:  1     Percent Mode Switch:  100%     Coumadin:  Yes Atrial Pacing:  <1%     Ventricular Pacing:  73%  Parameters Mode:  DDD     Lower Rate Limit:  60     Upper Rate Limit:  120 Paced AV Delay:  200     Sensed AV Delay:  200 Next Remote Date:  06/11/2010     Next Cardiology Appt Due:  09/06/2010 Tech Comments:  Ventricular autocapture and Atrial auto sense on.  Instructions given for [facility] transmissions every 3 months. ROV 6 months with Dr. Lovena Le in [place]. Alma Friendly, LPN  October  5, [id number] 1:53 PM

## 2010-07-07 NOTE — Medication Information (Signed)
Summary: CCR  Anticoagulant Therapy  Managed by: Edrick Oh, RN PCP: Dr. Alois Cliche MD: Lattie Haw MD, Herbie [place] Indication 1: Atrial Fibrillation (chronic) Lab Used: [facility] [place] Site: [place] INR POC 2.0  Dietary changes: no    Health status changes: no    Bleeding/hemorrhagic complications: no    Recent/future hospitalizations: no    Any changes in medication regimen? no    Recent/future dental: no  Any missed doses?: no       Is patient compliant with meds? yes       Allergies: No Known Drug Allergies  Anticoagulation Management History:      The patient is taking warfarin and comes in today for a routine follow up visit.  She is being anticoagulated because of chronic atrial fibrillation.  Positive risk factors for bleeding include an age of 65 years or older and history of CVA/TIA.  Negative risk factors for bleeding include no history of GI bleeding and absence of serious comorbidities.  The bleeding index is 'intermediate risk'.  Positive CHADS2 values include History of CHF, History of HTN, Age > 61 years old, and Prior Stroke/CVA/TIA.  Negative CHADS2 values include History of Diabetes.  The start date was 02/21/2009.  Her last INR was 4.33.  Anticoagulation responsible provider: Lattie Haw MD, Herbie [place].  INR POC: 2.0.  Cuvette Lot#: [id number].  Exp: 10/2010.    Anticoagulation Management Assessment/Plan:      The patient's current anticoagulation dose is Warfarin sodium 5 mg tabs: TAKE 1 TABLET DAILY AS DIRECTED BY ANTICOAGULATION CLINIC.  The target INR is 2.0-3.0.  The next INR is due 03/19/2010.  Anticoagulation instructions were given to patient.  Results were reviewed/authorized by Edrick Oh, RN.  She was notified by Edrick Oh RN.         Prior Anticoagulation Instructions: INR 2.6 Continue coumadin 2.5mg  once daily except 5mg  on Thursdays  Current Anticoagulation Instructions: INR 2.0 Continue coumadin 2.5mg  once daily except 5mg  on  Thursdays

## 2010-07-07 NOTE — Letter (Signed)
Summary: [facility], general practice at [facility]. [address], [place] [address]   Phone: [phone]  Fax: [phone]     February 27, 2010 MRN: [id number]   [facility] [address] [place], [place]  [address]      YOUR LAB WORK IS DUE   March 30, 2010  Please go to [facility], located across the street from [facility] on the second floor.  Hours are Monday - Friday 7am until 7:30pm         Saturday 8am until 12noon    _X_  DO NOT EAT OR DRINK AFTER MIDNIGHT EVENING PRIOR TO LABWORK  __ YOUR LABWORK IS NOT FASTING --YOU MAY EAT PRIOR TO LABWORK

## 2010-07-07 NOTE — Medication Information (Signed)
Summary: ccr-lr  Anticoagulant Therapy  Managed by: Edrick Oh, RN PCP: Dr. Alois Cliche MD: Domenic Polite MD, Mikeal Hawthorne Indication 1: Atrial Fibrillation (chronic) Lab Used: [facility] [place] Site: [place] INR POC 2.2  Dietary changes: no    Health status changes: no    Bleeding/hemorrhagic complications: no    Recent/future hospitalizations: no    Any changes in medication regimen? no    Recent/future dental: no  Any missed doses?: no       Is patient compliant with meds? yes       Allergies: No Known Drug Allergies  Anticoagulation Management History:      The patient is taking warfarin and comes in today for a routine follow up visit.  She is being anticoagulated because of chronic atrial fibrillation.  Positive risk factors for bleeding include an age of 75 years or older and history of CVA/TIA.  Negative risk factors for bleeding include no history of GI bleeding and absence of serious comorbidities.  The bleeding index is 'intermediate risk'.  Positive CHADS2 values include History of CHF, History of HTN, Age > 75 years old, and Prior Stroke/CVA/TIA.  Negative CHADS2 values include History of Diabetes.  The start date was 02/21/2009.  Her last INR was 4.33.  Anticoagulation responsible provider: Domenic Polite MD, Mikeal Hawthorne.  INR POC: 2.2.  Cuvette Lot#: [id number].  Exp: 10/2010.    Anticoagulation Management Assessment/Plan:      The patient's current anticoagulation dose is Warfarin sodium 5 mg tabs: TAKE 1 TABLET DAILY AS DIRECTED BY ANTICOAGULATION CLINIC.  The target INR is 2.0-3.0.  The next INR is due 04/27/2010.  Anticoagulation instructions were given to patient.  Results were reviewed/authorized by Edrick Oh, RN.  She was notified by Edrick Oh RN.         Prior Anticoagulation Instructions: INR 1.8 Take coumadin 7.5mg  tonight and  then resume 2.5mg  once daily except 5mg  on Thursdays  Current Anticoagulation Instructions: INR 2.2 Continue coumadin  2.5mg  once daily except 5mg  on Thursdays

## 2010-07-07 NOTE — Assessment & Plan Note (Signed)
Summary: F4M      Allergies Added: NKDA  Referring Provider:  Orthopaedics-Dr. Aline Brochure Primary Provider:  Dr. Orson Ape   History of Present Illness: [name] returns to the office as previously scheduled in January of this year.  Unfortunately, she was also seen in in April at Dr. Nolon Rod request after she had failed to start anticoagulation as prescribed.  Since then, she has done fine.  INRs have been stable and therapeutic after a bit of a rocky start with her first INR being 7.  She has had no bleeding.  Since she is not really due to be seen today, no formal visit will be undertaken and no charge submitted.  I will see her again in October as previously planned.  Current Medications (verified): 1)  Colcrys 0.6 Mg Tabs (Colchicine) .... Take 1 Tablet By Mouth Qd 2)  Zantac 150 Mg Tabs (Ranitidine Hcl) .... One By Mouth Bid 3)  Zocor 40 Mg Tabs (Simvastatin) .... One By Mouth Daily 4)  Allopurinol 100 Mg Tabs (Allopurinol) .... One By Mouth Daily 5)  Meclizine Hcl 25 Mg Tabs (Meclizine Hcl) .Marland Kitchen.. 1 Tab Up To 4 Times Daily 6)  Diltiazem Hcl Er Beads 360 Mg Xr24h-Cap (Diltiazem Hcl Er Beads) .... Take 1 Tablet By Mouth Once Daily 7)  Warfarin Sodium 5 Mg Tabs (Warfarin Sodium) .... Take 1 Tablet Daily As Directed By Anticoagulation Clinic 8)  Norco 5-325 Mg Tabs (Hydrocodone-Acetaminophen) .Marland Kitchen.. 1 By Mouth Q 4 As Needed Pain  Allergies (verified): No Known Drug Allergies  Vital Signs:  Patient profile:   77 year old female Height:      62 inches Weight:      173 pounds O2 Sat:      99 % on Room air Temp:     96.8 degrees F Pulse rate:   67 / minute BP sitting:   138 / 72  (left arm)  Vitals Entered By: Tye Savoy RN (November 27, 2009 1:47 PM)  O2 Flow:  Room air   PPM Specifications Following MD:  Thompson Grayer, MD     PPM Vendor:  [employer] Jude     PPM Model Number:  [id number]     PPM Serial Number:  [id number] PPM DOI:  11/06/2008     PPM Implanting MD:  Thompson Grayer,  MD  Lead 1    Location: RA     DOI: 11/06/2008     Model #: [id number]     Serial #: [id number]     Status: active Lead 2    Location: RV     DOI: 11/06/2008     Model #: [id number]     Serial #: [id number]     Status: active  Magnet Response Rate:  BOL 100 ERI  85    PPM Follow Up Pacer Dependent:  No      Episodes Coumadin:  No  Parameters Mode:  DDD     Lower Rate Limit:  60     Upper Rate Limit:  120 Paced AV Delay:  200     Sensed AV Delay:  200  Prevention & Chronic Care Immunizations   Influenza vaccine: Not documented    Tetanus booster: Not documented    Pneumococcal vaccine: Not documented    H. zoster vaccine: Not documented  Colorectal Screening   Hemoccult: Not documented    Colonoscopy: Not documented  Other Screening   Pap smear: Not documented    Mammogram: Not documented    DXA  bone density scan: Not documented   Smoking status: never  (02/26/2009)  Lipids   Total Cholesterol: Not documented   LDL: Not documented   LDL Direct: Not documented   HDL: Not documented   Triglycerides: Not documented    SGOT (AST): 16  (09/11/2009)   SGPT (ALT): 11  (09/11/2009)   Alkaline phosphatase: 127  (09/11/2009)   Total bilirubin: 0.6  (08/28/2009)  Hypertension   Last Blood Pressure: 138 / 72  (11/27/2009)   Serum creatinine: 0.98  (09/11/2009)   Serum potassium 4.0  (09/11/2009)  Self-Management Support :    Hypertension self-management support: Not documented    Lipid self-management support: Not documented

## 2010-07-07 NOTE — Medication Information (Signed)
Summary: protime/tg      Allergies Added:  Anticoagulant Therapy  Managed by: Tye Savoy, RN PCP: Dr. Orson Ape Supervising MD: Lattie Haw MD, Herbie [place] Indication 1: Atrial Fibrillation (chronic) INR POC 7.0  Dietary changes: no    Health status changes: no    Bleeding/hemorrhagic complications: no    Recent/future hospitalizations: no    Any changes in medication regimen? yes       Details: restarted on coumadin 09/12/2009  Recent/future dental: no  Any missed doses?: no       Is patient compliant with meds? yes       Current Medications (verified): 1)  Colcrys 0.6 Mg Tabs (Colchicine) .... Take 1 Tablet By Mouth Qd 2)  Zantac 150 Mg Tabs (Ranitidine Hcl) .... One By Mouth Bid 3)  Zocor 40 Mg Tabs (Simvastatin) .... One By Mouth Daily 4)  Allopurinol 100 Mg Tabs (Allopurinol) .... One By Mouth Daily 5)  Aspirin 325 Mg Tabs (Aspirin) .... One By Mouth Daily 6)  Meclizine Hcl 25 Mg Tabs (Meclizine Hcl) .Marland Kitchen.. 1 Tab Up To 4 Times Daily 7)  Diltiazem Hcl Er Beads 360 Mg Xr24h-Cap (Diltiazem Hcl Er Beads) .... Take 1 Tablet By Mouth Once Daily 8)  Warfarin Sodium 10 Mg Tabs (Warfarin Sodium) .... Use As Directed By Anticoagulation Clinic 9)  Norco 5-325 Mg Tabs (Hydrocodone-Acetaminophen) .Marland Kitchen.. 1 By Mouth Q 4 As Needed Pain 10)  Pravastatin Sodium 10 Mg Tabs (Pravastatin Sodium) .... Take One Tablet By Mouth Daily At Bedtime  Allergies (verified): 1)  ! Coumadin  Anticoagulation Management History:      The patient comes in today for her initial visit for anticoagulation therapy.  The patient is taking warfarin for chronic atrial fibrillation.  Positive risk factors for bleeding include an age of 75 years or older and history of CVA/TIA.  Negative risk factors for bleeding include no history of GI bleeding and absence of serious comorbidities.  The bleeding index is 'intermediate risk'.  Positive CHADS2 values include History of CHF, History of HTN, Age > 73 years old, and Prior  Stroke/CVA/TIA.  Negative CHADS2 values include History of Diabetes.  The start date was 02/21/2009.  Her last INR was 1.42.  Anticoagulation responsible provider: Lattie Haw MD, Herbie [place].  INR POC: 7.0.  Cuvette Lot#: [id number].  Exp: 10/11.    Anticoagulation Management Assessment/Plan:      The patient's current anticoagulation dose is Warfarin sodium 10 mg tabs: Use as directed by Anticoagulation Clinic.  The target INR is 2.0-3.0.  The next INR is due 09/19/2009.  Anticoagulation instructions were given to patient.  Results were reviewed/authorized by Tye Savoy, RN.         Prior Anticoagulation Instructions: INR 2.1 today  Continue coumadin 2.5mg  once daily except 5mg  on Monday and Fridays  Current Anticoagulation Instructions: PT INR 7.0 TODAY HOLD WARFARIN UNTIL RETURN FOR A COUMADIN CHECK ON FRIDAY  Appended Document: protime/tg Pt sent to lab for venapuncture.  Lab results from [facility] 09/16/09  PT 41.2   INR 4.33

## 2010-07-07 NOTE — Assessment & Plan Note (Signed)
Summary: [facility]      Allergies Added: NKDA  Referring Provider:  Josephina Gip Primary Provider:  Dr. Orson Ape  CC:  [facility].  Marland Kitchen  History of Present Illness: The patient presents today for routine electrophysiology followup. She reports doing very well since last being seen in our clinic. The patient denies symptoms of palpitations, chest pain, shortness of breath, orthopnea, PND, lower extremity edema, dizziness, presyncope, syncope, or neurologic sequela. The patient is tolerating medications without difficulties and is otherwise without complaint today.   Current Medications (verified): 1)  Colcrys 0.6 Mg Tabs (Colchicine) .... Take 1 Tablet By Mouth Qd 2)  Zantac 150 Mg Tabs (Ranitidine Hcl) .... One By Mouth Bid 3)  Zocor 40 Mg Tabs (Simvastatin) .... One By Mouth Daily 4)  Allopurinol 100 Mg Tabs (Allopurinol) .... One By Mouth Daily 5)  Meclizine Hcl 25 Mg Tabs (Meclizine Hcl) .Marland Kitchen.. 1 Tab Up To 4 Times Daily 6)  Diltiazem Hcl Er Beads 360 Mg Xr24h-Cap (Diltiazem Hcl Er Beads) .... Take 1 Tablet By Mouth Once Daily 7)  Warfarin Sodium 5 Mg Tabs (Warfarin Sodium) .... Take 1 Tablet Daily As Directed By Anticoagulation Clinic 8)  Norco 5-325 Mg Tabs (Hydrocodone-Acetaminophen) .Marland Kitchen.. 1 By Mouth Q 4 As Needed Pain  Allergies (verified): No Known Drug Allergies  Past History:  Past Medical History: Tachycardia-bradycardia syndrome with pauses and syncope; PPM implantation55/2010; negative stress       nuclear study in 10/06 Persistent atrial fibrillation Severe mitral annular calcification; mild to moderate stenosis-not clearly rheumatic Hypertensive heart disease-diastolic dysfunction; mild pulmonary edema in 2006; responded to diuretics Ventricular hypertrophy.  Hypertension.  Chronic renal insufficiency, baseline creatinine 1.4.  Hyperlipidemia Angioedema-3/08 Anemia-H./H. 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 [facility] [employer] 11/06/08 Right TKA-04/2009  Social History: Reviewed history from 11/26/2008 and no changes required. Retired  Married  Tobacco Use - No.  Alcohol Use - no Regular Exercise - no Drug Use -no  Vital Signs:  Patient profile:   77 year old female Height:      62 inches Weight:      176 pounds BMI:     32.31 Pulse rate:   81 / minute Pulse rhythm:   regular BP sitting:   169 / 87  (left arm) Cuff size:   regular  Vitals Entered By: Doug Sou CMA (November 10, 2009 10:02 AM)  Physical Exam  General:  Well developed, well nourished, in no acute distress. Head:  normocephalic and atraumatic Eyes:  PERRLA/EOM intact; conjunctiva and lids normal. Mouth:  Teeth, gums and palate normal. Oral mucosa normal. Neck:  Neck supple, no JVD. No carotid bruits. Chest Wall:  pacemaker site well healed Lungs:  Clear bilaterally to auscultation and percussion. Heart:  iRRR, no m/r/g Abdomen:  Bowel sounds positive; abdomen soft and non-tender without masses, organomegaly, or hernias noted. No hepatosplenomegaly. Msk:  Back normal, normal gait. Muscle strength and tone normal. Pulses:  pulses normal in all 4 extremities Extremities:  mild RLE edema Neurologic:  Alert and  oriented x 3. Skin:  Intact without lesions or rashes. Psych:  Normal affect.   PPM Specifications Following MD:  Thompson Grayer, MD     PPM Vendor:  [employer] Jude     PPM Model Number:  [id number]     PPM Serial Number:  [id number] PPM DOI:  11/06/2008     PPM Implanting MD:  Thompson Grayer,  MD  Lead 1    Location: RA     DOI: 11/06/2008     Model #: [id number]     Serial #: [id number]     Status: active Lead 2    Location: RV     DOI: 11/06/2008     Model #: [id number]     Serial #: [id number]     Status: active  Magnet Response Rate:  BOL 100 ERI  85    PPM Follow Up Battery Voltage:  2.98 V     Battery Est. Longevity:  8.5-9.60yrs     Pacer Dependent:  No       PPM Device Measurements Atrium  Amplitude: 1.5 mV, Impedance: 410 ohms,  Right Ventricle  Amplitude: 12.0 mV, Impedance: 400 ohms, Threshold: 0.75 V at 0.40 msec  Episodes MS Episodes:  13     Percent Mode Switch:  75%     Coumadin:  No Ventricular High Rate:  6     Atrial Pacing:  1.2%     Ventricular Pacing:  42%  Parameters Mode:  DDD     Lower Rate Limit:  60     Upper Rate Limit:  120 Paced AV Delay:  200     Sensed AV Delay:  200 Next Remote Date:  02/12/2010     Next Cardiology Appt Due:  11/06/2010 Tech Comments:  13 MODE SWITCHES--75% OF TIME IN MODE SWITCH.  6 VHR EPISODES--AFIB W/RVR.  PT BEEN IN AF SINCE 04-26-09.  V RATES DURNING AMS RELATIVELY WELL CONTROLLED.  CHANGES: A SENSITIVITY FROM 0.5 TO 0.1mV AND RV AMPLITUDE FROM 2.0 TO 2.5 V.  ENROLL IN [facility] CHECK 02-12-10. ROV IN 1 YR. Shelly Bombard  November 10, 2009 10:27 AM MD Comments:  agree,  v rates reasonably well controlled  Impression & Recommendations:  Problem # 1:  ATRIAL FIBRILLATION (ICD-427.31) stable, without symptoms rates are controlled no changes today continue coumadin  Problem # 2:  BRADYCARDIA (ICD-427.89) normal pacemaker function no changes today  Problem # 3:  HYPERTENSION, BENIGN (ICD-401.1) above goal salt restriction advised she will check her BP closely at home and follow-up with Dr Lattie Haw  Patient Instructions: 1)  return in 12 months

## 2010-07-07 NOTE — Assessment & Plan Note (Signed)
Summary: 6 mth f/u /tg  Medications Added VENTOLIN HFA 108 (90 BASE) MCG/ACT AERS (ALBUTEROL SULFATE) 2 puffs every 6 hrs as needed shortness of breath ZITHROMAX 250 MG TABS (AZITHROMYCIN) Take 1 tablet by mouth once a day x 5 days      Allergies Added: NKDA  Visit Type:  Follow-up Referring Provider:  Orthopaedics-Dr. Aline Brochure Primary Provider:  Dr. Orson Ape   History of Present Illness: [name] returns to the office for continued assessment and treatment of paroxysmal atrial fibrillation.  She has done generally well since her last visit with essentially no cardiopulmonary symptoms until recently when she developed productive cough and dyspnea on exertion.  She was evaluated by her primary care physician, who has diagnosed bronchitis and started her on azithromycin within the past 48 hours.  Her symptoms have been improved since that time.  She notes no palpitations, no lightheadedness and no syncope.  [name] underwent TKA a few months ago.  Recovery was initially difficult, but now she is walking extremely well.  She continues to carry a cane, but says that she does not need it.  EKG  Procedure date:  03/19/2010  Findings:      Rhythm Strip  Regular rhythm at a rate of 70 bpm without definite atrial activity Sinus rhythm vs. junctional No previous tracing for comparison.   Current Medications (verified): 1)  Colcrys 0.6 Mg Tabs (Colchicine) .... Take 1 Tablet By Mouth Qd 2)  Zantac 150 Mg Tabs (Ranitidine Hcl) .... One By Mouth Bid 3)  Zocor 40 Mg Tabs (Simvastatin) .... One By Mouth Daily 4)  Allopurinol 100 Mg Tabs (Allopurinol) .... One By Mouth Daily 5)  Meclizine Hcl 25 Mg Tabs (Meclizine Hcl) .Marland Kitchen.. 1 Tab Up To 4 Times Daily 6)  Diltiazem Hcl Er Beads 360 Mg Xr24h-Cap (Diltiazem Hcl Er Beads) .... Take 1 Tablet By Mouth Once Daily 7)  Warfarin Sodium 5 Mg Tabs (Warfarin Sodium) .... Take 1 Tablet Daily As Directed By Anticoagulation Clinic 8)  Norco  5-325 Mg Tabs (Hydrocodone-Acetaminophen) .Marland Kitchen.. 1 By Mouth Q 4 As Needed Pain 9)  Pravastatin Sodium 80 Mg Tabs (Pravastatin Sodium) .... Take One Tablet By Mouth Daily At Bedtime 10)  Ventolin Hfa 108 (90 Base) Mcg/act Aers (Albuterol Sulfate) .... 2 Puffs Every 6 Hrs As Needed Shortness of Breath 11)  Zithromax 250 Mg Tabs (Azithromycin) .... Take 1 Tablet By Mouth Once A Day X 5 Days  Allergies (verified): No Known Drug Allergies  Comments:  Nurse/Medical Assistant: patient reviewd previous med list from previous ov and stated meds are correct called Dr.mcgough to find out name of antibiotic and cough syrup that he started  her on.  Past History:  PMH, FH, and Social History reviewed and updated.  Review of Systems       See history of present illness.  Vital Signs:  Patient profile:   77 year old female Weight:      179 pounds BMI:     32.86 Temp:     97.8 degrees F Pulse rate:   90 / minute BP sitting:   131 / 67  (right arm)  Vitals Entered By: Doretha Sou, CNA (March 19, 2010 1:09 PM)  Physical Exam  General:  Overweight; well developed; no acute distress:   Neck-No JVD; no carotid bruits: Lungs-No tachypnea, no rales; no rhonchi; no wheezes: Cardiovascular-normal PMI; normal S1 and S2; grade 1/6 nearly holosystolic murmur at the left sternal border and apex. Abdomen-BS normal; soft and  non-tender without masses or organomegaly:  Musculoskeletal-No deformities, no cyanosis or clubbing: Neurologic-Normal cranial nerves; symmetric strength and tone:  Skin-Warm, no significant lesions: Extremities: 1-2+ distal pulses; no edema:     PPM Specifications Following MD:  Thompson Grayer, MD     PPM Vendor:  [employer]     PPM Model Number:  [id number]     PPM Serial Number:  [id number] DOI:  11/06/2008     PPM Implanting MD:  Thompson Grayer, MD  Lead 1    Location: RA     DOI: 11/06/2008     Model #: [id number]     Serial #: [id number]     Status: active Lead 2    Location: RV      DOI: 11/06/2008     Model #: [id number]     Serial #: [id number]     Status: active  Magnet Response Rate:  BOL 100 ERI  85    PPM Follow Up Pacer Dependent:  No      Episodes Coumadin:  Yes  Parameters Mode:  DDD     Lower Rate Limit:  60     Upper Rate Limit:  120 Paced AV Delay:  200     Sensed AV Delay:  200  Impression & Recommendations:  Problem # 1:  HYPERTENSION, BENIGN (ICD-401.1) Blood pressure control is good; current medications will be continued.  Problem # 2:  ATRIAL FIBRILLATION (ICD-427.31) Control of heart rate is adequate; current medications will be continued.  We continue to monitor anticoagulation and will do so more assiduously while she is being treated with antibiotics.  In light of her treatment with warfarin, a CBC and stool for Hemoccult testing will be obtained to exclude occult GI bleeding.  I will plan to see this nice woman again in 8 months.  Other Orders: Hemoccult Cards (Take Home) (Hemoccult Cards) Future Orders: T-CBC w/Diff [id number]) ... 03/30/2010  Patient Instructions: 1)  Your physician recommends that you schedule a follow-up appointment in: 8 months 2)  Your physician recommends that you return for lab work in: today 3)  Your physician has asked that you test your stool for blood. It is necessary to test 3 different stool specimens for accuracy. You will be given 3 hemoccult cards for specimen collection. For each stool specimen, place a small portion of stool sample (from 2 different areas of the stool) into the 2 squares on the card. Close card. Repeat with 2 more stool specimens. Bring the cards back to the office for testing.

## 2010-07-07 NOTE — Progress Notes (Signed)
Summary: Re-enroll in Anticoagulation Clinic  Medications Added WARFARIN SODIUM 10 MG TABS (WARFARIN SODIUM) Use as directed by Anticoagulation Clinic       Phone Note Outgoing Call   Call placed by: Tye Savoy RN,  September 12, 2009 1:38 PM Call placed to: Patient Summary of Call: S:pradexa or heparin administration B:05/2009, placed on pradexa samples and stopped her warfain, she states she is not going back on pradexa either A:pt did not have pradexa filled ,stated medication was too expensive, pt was given an rx card to off set the cost, states it was not enough.     restarted warfarin 10mg  Fri, Sat,and Sun. R: scheduled a [facility] appt for Monday for  INR check  Initial call taken by: Tye Savoy RN,  September 12, 2009 1:48 PM  Follow-up for Phone Call        Noted Follow-up by: Yehuda Savannah, MD, [facility],  September 15, 2009 7:46 AM    New/Updated Medications: WARFARIN SODIUM 10 MG TABS (WARFARIN SODIUM) Use as directed by Anticoagulation Clinic

## 2010-07-07 NOTE — Letter (Signed)
Summary: [facility] Lab Work Doctor, general practice at [place]. [address] [place] [address]   Phone: [phone]  Fax: [phone]     June 30, 2009 MRN: [id number]   [facility] [place], [place]  [address]      YOUR LAB WORK IS DUE   ___________MARCH 24, 2011______________________________  Please go to [facility], located across the street from [facility] on the second floor.  Hours are Monday - Friday 7am until 7:30pm         Saturday 8am until 12noon    _X_  DO NOT EAT OR DRINK AFTER MIDNIGHT EVENING PRIOR TO LABWORK  __ YOUR LABWORK IS NOT FASTING --YOU MAY EAT PRIOR TO LABWORK

## 2010-07-07 NOTE — Assessment & Plan Note (Signed)
Summary: **PER DR.FUSCO TO RESTART COUMADIN/TG  Medications Added CIPRO 500 MG TABS (CIPROFLOXACIN HCL) Take 1 tablet by mouth two times a day complete4/13/2011      Allergies Added: NKDA  Visit Type:  Follow-up Referring Provider:  Orthopaedics-Dr. Aline Brochure Primary Provider:  Dr. Orson Ape   History of Present Illness: [name] is seen back in the office today at Dr. Nolon Rod request for reconsideration of anticoagulant therapy.  At her most recent office visit, the benefits of dabigatran were discussed, samples provided to her and a prescription transmitted to her pharmacy.  Unfortunately, she found the medication to be excessively expensive, stopped taking it, but did not inform our office.  Dr. Gerarda Fraction astutely noted the problem and restarted warfarin.  Ms. Ensminger reports no neurologic symptoms and no bleeding difficulties.  She has taken the initial 3 doses of 10 mg prescribed for her and has an INR of 7 today.  Current Medications (verified): 1)  Colcrys 0.6 Mg Tabs (Colchicine) .... Take 1 Tablet By Mouth Qd 2)  Zantac 150 Mg Tabs (Ranitidine Hcl) .... One By Mouth Bid 3)  Zocor 40 Mg Tabs (Simvastatin) .... One By Mouth Daily 4)  Allopurinol 100 Mg Tabs (Allopurinol) .... One By Mouth Daily 5)  Meclizine Hcl 25 Mg Tabs (Meclizine Hcl) .Marland Kitchen.. 1 Tab Up To 4 Times Daily 6)  Diltiazem Hcl Er Beads 360 Mg Xr24h-Cap (Diltiazem Hcl Er Beads) .... Take 1 Tablet By Mouth Once Daily 7)  Warfarin Sodium 10 Mg Tabs (Warfarin Sodium) .... Use As Directed By Anticoagulation Clinic 8)  Norco 5-325 Mg Tabs (Hydrocodone-Acetaminophen) .Marland Kitchen.. 1 By Mouth Q 4 As Needed Pain 9)  Pravastatin Sodium 10 Mg Tabs (Pravastatin Sodium) .... Take One Tablet By Mouth Daily At Bedtime 10)  Cipro 500 Mg Tabs (Ciprofloxacin Hcl) .... Take 1 Tablet By Mouth Two Times A Day Complete4/13/2011  Allergies (verified): No Known Drug Allergies  Review of Systems       See history of present illness.  Vital  Signs:  Patient profile:   77 year old female Height:      62 inches Weight:      174 pounds O2 Sat:      97 % on Room air Pulse rate:   91 / minute BP sitting:   134 / 70  (left arm)  Vitals Entered By: Doretha Sou, CNA (September 16, 2009 11:30 AM)  O2 Flow:  Room air  Physical Exam  General:    Well developed, well nourished, in no acute distress; overweight Neck:  Neck supple, no JVD. No carotid bruits. Lungs:  Clear bilaterally to auscultation and percussion. Heart:  Normal first and second heart sounds; irregular rhythm at a rate of 90; grade [id number] systolic murmur at the left sternal border Abdomen:  Bowel sounds positive; abdomen soft and non-tender without masses, organomegaly, or hernias noted. No hepatosplenomegaly. Extremities-distal pulses intact; 1+ ankle edema. Skin-patches of increased pigmentation over the right leg in regions of previous bullous disease    PPM Specifications Following MD:  Thompson Grayer, MD     PPM Vendor:  [employer]     PPM Model Number:  [id number]     PPM Serial Number:  [id number] PPM DOI:  11/06/2008     PPM Implanting MD:  Thompson Grayer, MD  Lead 1    Location: RA     DOI: 11/06/2008     Model #: [id number]     Serial #: [id number]     Status: active  Lead 2    Location: RV     DOI: 11/06/2008     Model #: [id number]     Serial #: [id number]     Status: active  Magnet Response Rate:  BOL 100 ERI  85    PPM Follow Up Pacer Dependent:  No      Episodes Coumadin:  No  Parameters Mode:  DDD     Lower Rate Limit:  60     Upper Rate Limit:  120 Paced AV Delay:  200     Sensed AV Delay:  200  Impression & Recommendations:  Problem # 1:  ATRIAL FIBRILLATION (ICD-427.31) Patient has clear criteria for chronic anticoagulation and no contraindications.  We will reenroll in our anticoagulation clinic and manage warfarin dosing.  For now, 2.5 mg of oral vitamin K will be given.  Once INR has decreased to 3, she will resume her previous dose of 2.5 mg q.d.  I will see  this nice woman again in 6 months as previously planned.  Patient Instructions: 1)  Your physician recommends that you schedule a follow-up appointment in: 6 MONTHS 2)  Your physician has recommended you make the following change in your medication: TAKE VIT K 2.5MG  TABLET BY MOUTH NOW 3)  You have been referred to DR Merkel

## 2010-07-07 NOTE — Medication Information (Signed)
Summary: ccr-lr  Anticoagulant Therapy  Managed by: Edrick Oh, RN PCP: Dr. Alois Cliche MD: Lattie Haw MD, Herbie [place] Indication 1: Atrial Fibrillation (chronic) Lab Used: [facility] [place] Site: [place] INR POC 2.2  Dietary changes: no    Health status changes: no    Bleeding/hemorrhagic complications: no    Recent/future hospitalizations: no    Any changes in medication regimen? no    Recent/future dental: no  Any missed doses?: no       Is patient compliant with meds? yes       Allergies: No Known Drug Allergies  Anticoagulation Management History:      The patient is taking warfarin and comes in today for a routine follow up visit.  The patient is taking warfarin for chronic atrial fibrillation.  Positive risk factors for bleeding include an age of 75 years or older and history of CVA/TIA.  Negative risk factors for bleeding include no history of GI bleeding and absence of serious comorbidities.  The bleeding index is 'intermediate risk'.  Positive CHADS2 values include History of CHF, History of HTN, Age > 75 years old, and Prior Stroke/CVA/TIA.  Negative CHADS2 values include History of Diabetes.  The start date was 02/21/2009.  Her last INR was 4.33.  Anticoagulation responsible provider: Lattie Haw MD, Herbie [place].  INR POC: 2.2.  Cuvette Lot#: [id number].  Exp: 10/2010.    Anticoagulation Management Assessment/Plan:      The patient's current anticoagulation dose is Warfarin sodium 5 mg tabs: TAKE 1 TABLET DAILY AS DIRECTED BY ANTICOAGULATION CLINIC.  The target INR is 2.0-3.0.  The next INR is due 10/13/2009.  Anticoagulation instructions were given to patient.  Results were reviewed/authorized by Edrick Oh, RN.  She was notified by Edrick Oh RN.         Prior Anticoagulation Instructions: INR 5.7 Hold coumadin x 3 days (Thursday, Friday and Saturday), then take 2.5mg  on Sunday. Monday and Tuesday  Current Anticoagulation Instructions: INR 2.2 Continue  coumadin 2.5mg  once daily

## 2010-07-07 NOTE — Medication Information (Signed)
Summary: ccr-lr  Anticoagulant Therapy  Managed by: Edrick Oh, RN PCP: Dr. Alois Cliche MD: Domenic Polite MD, Mikeal Hawthorne Indication 1: Atrial Fibrillation (chronic) Lab Used: [facility] [place] Site: [place] INR POC 2.5  Dietary changes: no    Health status changes: no    Bleeding/hemorrhagic complications: no    Recent/future hospitalizations: no    Any changes in medication regimen? no    Recent/future dental: no  Any missed doses?: no       Is patient compliant with meds? yes       Allergies: No Known Drug Allergies  Anticoagulation Management History:      The patient is taking warfarin and comes in today for a routine follow up visit.  The patient is on warfarin for chronic atrial fibrillation.  Positive risk factors for bleeding include an age of 75 years or older and history of CVA/TIA.  Negative risk factors for bleeding include no history of GI bleeding and absence of serious comorbidities.  The bleeding index is 'intermediate risk'.  Positive CHADS2 values include History of CHF, History of HTN, Age > 75 years old, and Prior Stroke/CVA/TIA.  Negative CHADS2 values include History of Diabetes.  The start date was 02/21/2009.  Her last INR was 4.33.  Anticoagulation responsible provider: Domenic Polite MD, Mikeal Hawthorne.  INR POC: 2.5.  Cuvette Lot#: [id number].  Exp: 10/2010.    Anticoagulation Management Assessment/Plan:      The patient's current anticoagulation dose is Warfarin sodium 5 mg tabs: TAKE 1 TABLET DAILY AS DIRECTED BY ANTICOAGULATION CLINIC.  The target INR is 2.0-3.0.  The next INR is due 12/01/2009.  Anticoagulation instructions were given to patient.  Results were reviewed/authorized by Edrick Oh, RN.  She was notified by Edrick Oh RN.         Prior Anticoagulation Instructions: INR 2.4 Continue coumadin 2.5mg  once daily   Current Anticoagulation Instructions: INR 2.5 Continue coumadin 2.5mg  once daily

## 2010-07-07 NOTE — Letter (Signed)
Summary: Appointment - Missed  [facility] [place]. [address] [place] [address]   Phone: [phone]  Fax: [phone]     December 24, 2009 MRN: [id number]   [facility] [address] [place],   [address]   Dear Ms. Gaffey,  Our records indicate you missed your appointment on    12/24/09 COUMADIN CLINIC            It is very important that we reach you to reschedule this appointment. We look forward to participating in your health care needs. Please contact us at the number listed above at your earliest convenience to reschedule this appointment.     Sincerely,    Public relations account executive

## 2010-07-07 NOTE — Letter (Signed)
Summary: Appointment - Missed  [place], [place]    Phone:   Fax:      February 18, 2010 MRN: [id number]   [facility] [address] [place], [place]  [address]   Dear Ms. Orengo,  Our records indicate you missed your appointment on     02/18/10 [place]          It is very important that we reach you to reschedule this appointment. We look forward to participating in your health care needs. Please contact us at the number listed above at your earliest convenience to reschedule this appointment.     Sincerely,    Public relations account executive

## 2010-07-07 NOTE — Medication Information (Signed)
Summary: PROTIME/TG  Anticoagulant Therapy  Managed by: Edrick Oh, RN PCP: Dr. Alois Cliche MD: Domenic Polite MD, Mikeal Hawthorne Indication 1: Atrial Fibrillation (chronic) Lab Used: [facility] [place] Site: [place] INR POC 1.7  Dietary changes: no    Health status changes: no    Bleeding/hemorrhagic complications: no    Recent/future hospitalizations: no    Any changes in medication regimen? no    Recent/future dental: no  Any missed doses?: no       Is patient compliant with meds? yes       Allergies: No Known Drug Allergies  Anticoagulation Management History:      The patient is taking warfarin and comes in today for a routine follow up visit.  She is being anticoagulated due to chronic atrial fibrillation.  Positive risk factors for bleeding include an age of 26 years or older and history of CVA/TIA.  Negative risk factors for bleeding include no history of GI bleeding and absence of serious comorbidities.  The bleeding index is 'intermediate risk'.  Positive CHADS2 values include History of CHF, History of HTN, Age > 55 years old, and Prior Stroke/CVA/TIA.  Negative CHADS2 values include History of Diabetes.  The start date was 02/21/2009.  Her last INR was 4.33.  Anticoagulation responsible provider: Domenic Polite MD, Mikeal Hawthorne.  INR POC: 1.7.  Cuvette Lot#: [id number].  Exp: 10/2010.    Anticoagulation Management Assessment/Plan:      The patient's current anticoagulation dose is Warfarin sodium 5 mg tabs: TAKE 1 TABLET DAILY AS DIRECTED BY ANTICOAGULATION CLINIC.  The target INR is 2.0-3.0.  The next INR is due 01/19/2010.  Anticoagulation instructions were given to patient.  Results were reviewed/authorized by Edrick Oh, RN.  She was notified by Edrick Oh RN.         Prior Anticoagulation Instructions: INR 1.7 Take coumadin 1 tablet tonight then resume 1/2 tablet once daily   Current Anticoagulation Instructions: INR 1.7 Increase coumadin 2.5mg  once daily  except 5mg  on Thursdays

## 2010-07-07 NOTE — Letter (Signed)
Summary: History form  History form   Imported By: Ruffin Pyo 04/03/2010 09:55:43  _____________________________________________________________________  External Attachment:    Type:   Image     Comment:   External Document

## 2010-07-07 NOTE — Medication Information (Signed)
Summary: ccr-lr  Anticoagulant Therapy  Managed by: Edrick Oh, RN PCP: Dr. Alois Cliche MD: Lattie Haw MD, Herbie [place] Indication 1: Atrial Fibrillation (chronic) Lab Used: [facility] [place] Site: [place] INR POC 1.8  Dietary changes: no    Health status changes: no    Bleeding/hemorrhagic complications: no    Recent/future hospitalizations: no    Any changes in medication regimen? no    Recent/future dental: no  Any missed doses?: no       Is patient compliant with meds? yes       Allergies: No Known Drug Allergies  Anticoagulation Management History:      The patient is taking warfarin and comes in today for a routine follow up visit.  She is being anticoagulated because of chronic atrial fibrillation.  Positive risk factors for bleeding include an age of 75 years or older and history of CVA/TIA.  Negative risk factors for bleeding include no history of GI bleeding and absence of serious comorbidities.  The bleeding index is 'intermediate risk'.  Positive CHADS2 values include History of CHF, History of HTN, Age > 75 years old, and Prior Stroke/CVA/TIA.  Negative CHADS2 values include History of Diabetes.  The start date was 02/21/2009.  Her last INR was 4.33.  Anticoagulation responsible provider: Lattie Haw MD, Herbie [place].  INR POC: 1.8.  Cuvette Lot#: [id number].  Exp: 10/2010.    Anticoagulation Management Assessment/Plan:      The patient's current anticoagulation dose is Warfarin sodium 5 mg tabs: TAKE 1 TABLET DAILY AS DIRECTED BY ANTICOAGULATION CLINIC.  The target INR is 2.0-3.0.  The next INR is due 04/02/2010.  Anticoagulation instructions were given to patient.  Results were reviewed/authorized by Edrick Oh, RN.  She was notified by Edrick Oh RN.         Prior Anticoagulation Instructions: INR 2.0 Continue coumadin 2.5mg  once daily except 5mg  on Thursdays  Current Anticoagulation Instructions: INR 1.8 Take coumadin 7.5mg  tonight and  then resume  2.5mg  once daily except 5mg  on Thursdays

## 2010-07-07 NOTE — Assessment & Plan Note (Signed)
Summary: NEW PROBLEM/LT THUMB PAIN/TRIGGER FINGER/BLUE MEDICARE/CAF   Visit Type:  new problem Referring Provider:  Orthopaedics-Dr. Aline Brochure Primary Provider:  Dr. Orson Ape  CC:  left thumb pain.  History of Present Illness: I saw [name] in the office today for a new problem visit.  She is a 77 years old woman with the complaint of:  left thumb pain, and locking.  No xrays.  No injury.  Meds: in EMR, [place] [facility]   77 year old female status post total knee replacement doing well presents with pain locking and catching for several weeks in her LEFT thumb    Allergies (verified): No Known Drug Allergies  Past History:  Past Medical History: Last updated: 11/10/2009 Tachycardia-bradycardia syndrome with pauses and syncope; PPM implantation55/2010; negative stress       nuclear study in 10/06 Persistent atrial fibrillation Severe mitral annular calcification; mild to moderate stenosis-not clearly rheumatic Hypertensive heart disease-diastolic dysfunction; mild pulmonary edema in 2006; responded to diuretics Ventricular hypertrophy.  Hypertension.  Chronic renal insufficiency, baseline creatinine 1.4.  Hyperlipidemia Angioedema-3/08 Anemia-H./H. 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  Alcohol Use - no Regular Exercise - no Drug Use -no 12TH GRADE ED  Review of Systems Cardiovascular:  Complains of chest pain and fainting; denies  palpitations and murmurs. Respiratory:  Complains of wheezing and couch; denies short of breath, tightness, pain on inspiration, and snoring . Gastrointestinal:  Complains of heartburn; denies nausea, vomiting, diarrhea, constipation, and blood in your stools. Musculoskeletal:  Complains of joint pain, swelling, and stiffness; denies instability, redness, heat, and muscle pain. HEENT:  Complains of redness; denies blurred or double vision, eye pain, and watering.  The review of systems is negative for Constitutional, Genitourinary, Neurologic, Endocrine, Psychiatric, Skin, Immunology, and Hemoatologic.  Physical Exam  Additional Exam:  the patient presents today in overall excellent appearance. She is oriented x3. Mood and affect are normal Gait and station are normal occasionally uses a cane LEFT hand: Inspection reveals tenderness over the A1 pulley with a nodular flexor tendon.  Obvious jumping and catching are seen with flexion-extension of the thumb. Range of motion remains full.  Stability remains normal.  Flexion strength is normal.  Skin is intact.  Temperature of the digit is normal and there is normal perfusion and capillary refill.  There is normal sensation.     Impression & Recommendations:  Problem # 1:  TRIGGER FINGER (ICD-727.3) Assessment New Verbal consent was obtained: The finger was prepped with ethyl chloride and injected with 1:1 injection of .25% sensorcaine, 1cc  and 40 mg of depomedrol, 1cc. There were no complications. LEFT thumb  Other Orders: Est. Patient Level III [id number]) Injection, Tendon / Ligament [id number])  Patient Instructions: 1)  You have received an injection of cortisone today. You may experience increased pain at the injection site. Apply ice pack to the area for 20 minutes every 2 hours and take 2 xtra strength tylenol every 8 hours. This increased pain will usually resolve in  24 hours. The injection will take effect in 3-10 days.  2)  Please  schedule a follow-up appointment as needed.   Orders Added: 1)  Est. Patient Level III [id number] 2)  Injection, Tendon / Ligament [id number]

## 2010-07-07 NOTE — Medication Information (Signed)
Summary: ccr-lr  Anticoagulant Therapy  Managed by: Edrick Oh, RN PCP: Dr. Alois Cliche MD: Verl Blalock MD, Marcello Moores Indication 1: Atrial Fibrillation (chronic) Lab Used: [facility] [place] Site: [place] INR POC 2.6  Dietary changes: no    Health status changes: no    Bleeding/hemorrhagic complications: no    Recent/future hospitalizations: no    Any changes in medication regimen? no    Recent/future dental: no  Any missed doses?: no       Is patient compliant with meds? yes       Allergies: No Known Drug Allergies  Anticoagulation Management History:      The patient is taking warfarin and comes in today for a routine follow up visit.  She is being anticoagulated due to chronic atrial fibrillation.  Positive risk factors for bleeding include an age of 65 years or older and history of CVA/TIA.  Negative risk factors for bleeding include no history of GI bleeding and absence of serious comorbidities.  The bleeding index is 'intermediate risk'.  Positive CHADS2 values include History of CHF, History of HTN, Age > 59 years old, and Prior Stroke/CVA/TIA.  Negative CHADS2 values include History of Diabetes.  The start date was 02/21/2009.  Her last INR was 4.33.  Anticoagulation responsible Annalycia Done: Verl Blalock MD, Marcello Moores.  INR POC: 2.6.  Cuvette Lot#: [id number].  Exp: 10/2010.    Anticoagulation Management Assessment/Plan:      The patient's current anticoagulation dose is Warfarin sodium 5 mg tabs: TAKE 1 TABLET DAILY AS DIRECTED BY ANTICOAGULATION CLINIC.  The target INR is 2.0-3.0.  The next INR is due 02/16/2010.  Anticoagulation instructions were given to patient.  Results were reviewed/authorized by Edrick Oh, RN.  She was notified by Edrick Oh RN.         Prior Anticoagulation Instructions: INR 1.7 Increase coumadin 2.5mg  once daily except 5mg  on Thursdays  Current Anticoagulation Instructions: INR 2.6 Continue coumadin 2.5mg  once daily except 5mg  on  Thursdays

## 2010-07-07 NOTE — Progress Notes (Signed)
Summary: Call from Dr. Gerarda Fraction regarding anticoagulation   Phone Note From Other Clinic   Caller: Physician Call For: Dr. Lattie Haw Action Taken: Phone Call Completed Details for Reason: Discuss patient Summary of Call: 09/11/2009  Dr. Gerarda Fraction reports that patient is receiving no anticoagulation.  Prior note reviewed.  She had been instructed to take dabigatran and a prescription provided to her.  Unfortunately, she did not do so for reasons that are not entirely clear.  Expense may have been an issue.  Dr. Gerarda Fraction restarted warfarin and referred her back to our anticoagulation clinic. Initial call taken by: Yehuda Savannah, MD, [facility],  September 15, 2009 7:44 AM

## 2010-07-07 NOTE — Medication Information (Signed)
Summary: friday protime per checkout on 09/16/09/tg  Medications Added INR 3.3 TODAY HOLD TODAY THEN BEGIN 1 TABLET MONDAY,WEDNESDAY AND FRIDAY, 1/2 TABLET ALL OTHER DAYSFUROSEMIDE 40 MG TABS (FUROSEMIDE) Take 1 tablet by mouth once a day FUROSEMIDE 40 MG TABS (FUROSEMIDE) Take 1 tablet by mouth once a day COLCRYS 0.6 MG TABS (COLCHICINE) Take 1 tablet by mouth qd MECLIZINE HCL 25 MG TABS (MECLIZINE HCL) As needed MECLIZINE HCL 25 MG TABS (MECLIZINE HCL) As needed KEPPRA 250 MG TABS (LEVETIRACETAM) Take 1 tablet by mouth two times a day KEPPRA 250 MG TABS (LEVETIRACETAM) Take 1 tablet by mouth two times a day LISINOPRIL 40 MG TABS (LISINOPRIL) Take 1 tablet by mouth once a day LISINOPRIL 40 MG TABS (LISINOPRIL) Take 1 tablet by mouth once a day CARDURA 4 MG TABS (DOXAZOSIN MESYLATE) Take 1 tablet by mouth once a day CARDURA 4 MG TABS (DOXAZOSIN MESYLATE) Take 1 tablet by mouth once a day KEPPRA 250 MG TABS (LEVETIRACETAM) Take 1 tablet by mouth two times a day KEPPRA 250 MG TABS (LEVETIRACETAM) Take 1 tablet by mouth two times a day SIMVASTATIN 40 MG TABS (SIMVASTATIN) take 1 tab daily SIMVASTATIN 40 MG TABS (SIMVASTATIN) take 1 tab daily ZANTAC 150 MG TABS (RANITIDINE HCL) one by mouth bid ZOCOR 40 MG TABS (SIMVASTATIN) one by mouth daily ALLOPURINOL 100 MG TABS (ALLOPURINOL) one by mouth daily ASPIRIN 325 MG TABS (ASPIRIN) one by mouth daily ASPIRIN 325 MG TABS (ASPIRIN) one by mouth daily DOXAZOSIN MESYLATE 2 MG TABS (DOXAZOSIN MESYLATE) one by mouth daily DOXAZOSIN MESYLATE 4 MG TABS (DOXAZOSIN MESYLATE) take one tablet by mouth once daily DOXAZOSIN MESYLATE 4 MG TABS (DOXAZOSIN MESYLATE) take one tablet by mouth once daily MECLIZINE HCL 25 MG TABS (MECLIZINE HCL) 1 tab up to 4 times daily METOCLOPRAMIDE HCL 10 MG TABS (METOCLOPRAMIDE HCL) take one tablet every four hours METOCLOPRAMIDE HCL 10 MG TABS (METOCLOPRAMIDE HCL) take one tablet every four hours MELOXICAM 15 MG TABS  (MELOXICAM) once daily MELOXICAM 15 MG TABS (MELOXICAM) once daily DILTIAZEM HCL ER BEADS 360 MG XR24H-CAP (DILTIAZEM HCL ER BEADS) Take 1 tablet by mouth once daily DILTIAZEM HCL CR 240 MG XR24H-CAP (DILTIAZEM HCL) 1 once daily WARFARIN SODIUM 2.5 MG TABS (WARFARIN SODIUM) AS DIRECTED WARFARIN SODIUM 5 MG TABS (WARFARIN SODIUM) TAKE 1 TABLET DAILY AS DIRECTED BY ANTICOAGULATION CLINIC WARFARIN SODIUM 10 MG TABS (WARFARIN SODIUM) Use as directed by Anticoagulation Clinic [facility] [place] #3 300-30 MG TABS (ACETAMINOPHEN-CODEINE) 1 by mouth q 4 as needed TYLENOL WITH CODEINE #3 300-30 MG TABS (ACETAMINOPHEN-CODEINE) 1 by mouth q 4 as needed NORCO 5-325 MG TABS (HYDROCODONE-ACETAMINOPHEN) 1 by mouth q 4 as needed pain PRAVASTATIN SODIUM 10 MG TABS (PRAVASTATIN SODIUM) Take one tablet by mouth daily at bedtime CIPRO 500 MG TABS (CIPROFLOXACIN HCL) Take 1 tablet by mouth two times a day complete4/13/2011      Allergies Added: NKDA Anticoagulant Therapy  Managed by: Tye Savoy, RN PCP: Dr. Orson Ape Supervising MD: Lattie Haw MD, Herbie Baltimore Indication 1: Atrial Fibrillation (chronic) INR POC 3.3  Dietary changes: no    Health status changes: no    Bleeding/hemorrhagic complications: no    Recent/future hospitalizations: no    Any changes in medication regimen? no    Recent/future dental: no  Any missed doses?: no       Is patient compliant with meds? yes       Current Medications (verified): 1)  Colcrys 0.6 Mg Tabs (Colchicine) .... Take 1 Tablet By Mouth Qd  2)  Zantac 150 Mg Tabs (Ranitidine Hcl) .... One By Mouth Bid 3)  Zocor 40 Mg Tabs (Simvastatin) .... One By Mouth Daily 4)  Allopurinol 100 Mg Tabs (Allopurinol) .... One By Mouth Daily 5)  Meclizine Hcl 25 Mg Tabs (Meclizine Hcl) .Marland Kitchen.. 1 Tab Up To 4 Times Daily 6)  Diltiazem Hcl Er Beads 360 Mg Xr24h-Cap (Diltiazem Hcl Er Beads) .... Take 1 Tablet By Mouth Once Daily 7)  Warfarin Sodium 10 Mg Tabs (Warfarin Sodium) ....  Use As Directed By Anticoagulation Clinic 8)  Norco 5-325 Mg Tabs (Hydrocodone-Acetaminophen) .Marland Kitchen.. 1 By Mouth Q 4 As Needed Pain 9)  Pravastatin Sodium 10 Mg Tabs (Pravastatin Sodium) .... Take One Tablet By Mouth Daily At Bedtime 10)  Cipro 500 Mg Tabs (Ciprofloxacin Hcl) .... Take 1 Tablet By Mouth Two Times A Day Complete4/13/2011  Allergies (verified): No Known Drug Allergies  Anticoagulation Management History:      The patient is taking warfarin and comes in today for a routine follow up visit.  The patient is taking warfarin for chronic atrial fibrillation.  Positive risk factors for bleeding include an age of 12 years or older and history of CVA/TIA.  Negative risk factors for bleeding include no history of GI bleeding and absence of serious comorbidities.  The bleeding index is 'intermediate risk'.  Positive CHADS2 values include History of CHF, History of HTN, Age > 45 years old, and Prior Stroke/CVA/TIA.  Negative CHADS2 values include History of Diabetes.  The start date was 02/21/2009.  Her last INR was 4.33.  Anticoagulation responsible provider: Lattie Haw MD, Herbie Baltimore.  INR POC: 3.3.  Cuvette Lot#: [id number].  Exp: 10/2010.    Anticoagulation Management Assessment/Plan:      The patient's current anticoagulation dose is Warfarin sodium 5 mg tabs: TAKE 1 TABLET DAILY AS DIRECTED BY ANTICOAGULATION CLINIC.  The target INR is 2.0-3.0.  The next INR is due 09/25/2009.  Anticoagulation instructions were given to patient.  Results were reviewed/authorized by Tye Savoy, RN.  She was notified by Tye Savoy RN.         Prior Anticoagulation Instructions: PT INR 7.0 TODAY HOLD WARFARIN UNTIL RETURN FOR A COUMADIN CHECK ON FRIDAY  Current Anticoagulation Instructions: INR 3.3 TODAY HOLD TODAY THEN BEGIN 1 TABLET MONDAY,WEDNESDAY AND FRIDAY, 1/2 TABLET ALL OTHER DAYS Prescriptions: WARFARIN SODIUM 5 MG TABS (WARFARIN SODIUM) TAKE 1 TABLET DAILY AS DIRECTED BY ANTICOAGULATION CLINIC  #60  x 3   Entered by:   Tye Savoy RN   Authorized by:   Yehuda Savannah, MD, [facility]   Signed by:   Tye Savoy RN on 09/19/2009   Method used:   Electronically to        [facility].* (retail)       [address]       [place], [place]  [address]       Ph: [phone]       Fax: [phone]   RxID:   [id number]

## 2010-07-07 NOTE — Medication Information (Signed)
Summary: Coumadin Clinic  Anticoagulant Therapy  Managed by: Inactive PCP: Dr. Orson Ape Supervising MD: Lattie Haw MD, Herbie Baltimore Indication 1: Atrial Fibrillation (chronic)          Comments: Pt was taken off couamdin by Dr Aline Brochure and is starting pradaxa.  Allergies: 1)  ! Coumadin  Anticoagulation Management History:      Anticoagulation is being administered due to chronic atrial fibrillation.  Positive risk factors for bleeding include an age of 13 years or older and history of CVA/TIA.  Negative risk factors for bleeding include no history of GI bleeding and absence of serious comorbidities.  The bleeding index is 'intermediate risk'.  Positive CHADS2 values include History of CHF, History of HTN, Age > 70 years old, and Prior Stroke/CVA/TIA.  Negative CHADS2 values include History of Diabetes.  The start date was 02/21/2009.  Her last INR was 1.42.  Anticoagulation responsible provider: Lattie Haw MD, Herbie Baltimore.  Exp: 10/11.    Anticoagulation Management Assessment/Plan:      The patient's current anticoagulation dose is Warfarin sodium 2.5 mg tabs: AS DIRECTED.  The target INR is 2.0-3.0.  The next INR is due 04/28/2009.  Anticoagulation instructions were given to patient.  Results were reviewed/authorized by Inactive.         Prior Anticoagulation Instructions: INR 2.1 today  Continue coumadin 2.5mg  once daily except 5mg  on Monday and Fridays

## 2010-07-07 NOTE — Cardiovascular Report (Signed)
Summary: Office Visit   Office Visit   Imported By: Sallee Provencal 03/19/2010 15:20:01  _____________________________________________________________________  External Attachment:    Type:   Image     Comment:   External Document

## 2010-07-07 NOTE — Assessment & Plan Note (Signed)
Summary: 6 WK RE-CK RT KNEE/HAD XR 06/11/09 VISIT/TKA SURG FOLUP/BLUE ME...   Visit Type:  Follow-up Referring Provider:  Orthopaedics-Dr. Aline Brochure Primary Provider:  Dr. Orson Ape  CC:  post op rt tka.  History of Present Illness: I saw [name] in the office today for a followup visit.  She is a 77 years old woman status post RIGHT total knee arthroplasty on April 22 2009 with a Depew implant  Medication Norco 5 as needed for pain in the legs, no pain in the knee.  Last xray 06/11/09 for review right total knee.  she is done remarkably well she did have some Coumadin related skin blisters which we thought was anticoagulant related I think it wishes swelling nevertheless she is very happy with her knee replacement.  She has an occasional use of a cane but for the most part walks independently without any symptoms  She has full extension and 120 of knee flexion.  Her knee is stable.  Doing well postop from a knee replacement surgery followup at one year anniversary  Allergies: 1)  ! Coumadin   Other Orders: Post-Op Check [id number]  Patient Instructions: 1)  You are doing great 2)  Come back in November 2011 for xrays rt total knee

## 2010-07-07 NOTE — Letter (Signed)
Summary: [facility], general practice at [place]. [address], [place] [address]   Phone: [phone]  Fax: [phone]      September 02, 2009 MRN: [id number]   [facility] [address] [place], [place]  [address]   Dear Ms. Flanagin,  Your test ordered by Rande Lawman has been reviewed by your physician (or physician assistant) and was found to be normal or stable. Your physician (or physician assistant) felt no changes were needed at this time.  ____ Echocardiogram  ____ Cardiac Stress Test  __x__ Lab Work  ____ Peripheral vascular study of arms, legs or neck  ____ CT scan or X-ray  ____ Lung or Breathing test  ____ Other:  No change in medical treatment at this time, per Leonia Reader, NP.  Enclosed is a copy of your labwork for your records.  Thank you, Deeanne Deininger Baird Cancer RN    Jacqulyn Ducking, MD, [facility] Gamer.C.Renella Cunas, MD, F.A.C.C Cristopher Peru, MD, F.A.C.C Rozann Lesches, MD, F.A.C.C Jenkins Rouge, MD, [facility] Gamer.C.C

## 2010-07-07 NOTE — Medication Information (Signed)
Summary: ccr-lr  Anticoagulant Therapy  Managed by: Edrick Oh, RN PCP: Dr. Alois Cliche MD: Lattie Haw MD, Herbie Baltimore Indication 1: Atrial Fibrillation (chronic) Lab Used: [facility] [place] Site: [place] INR POC 2.4  Dietary changes: no    Health status changes: no    Bleeding/hemorrhagic complications: no    Recent/future hospitalizations: no    Any changes in medication regimen? no    Recent/future dental: no  Any missed doses?: no       Is patient compliant with meds? yes       Allergies: No Known Drug Allergies  Anticoagulation Management History:      The patient is taking warfarin and comes in today for a routine follow up visit.  The patient is on warfarin for chronic atrial fibrillation.  Positive risk factors for bleeding include an age of 50 years or older and history of CVA/TIA.  Negative risk factors for bleeding include no history of GI bleeding and absence of serious comorbidities.  The bleeding index is 'intermediate risk'.  Positive CHADS2 values include History of CHF, History of HTN, Age > 63 years old, and Prior Stroke/CVA/TIA.  Negative CHADS2 values include History of Diabetes.  The start date was 02/21/2009.  Her last INR was 4.33.  Anticoagulation responsible provider: Lattie Haw MD, Herbie Baltimore.  INR POC: 2.4.  Cuvette Lot#: [id number].  Exp: 10/2010.    Anticoagulation Management Assessment/Plan:      The patient's current anticoagulation dose is Warfarin sodium 5 mg tabs: TAKE 1 TABLET DAILY AS DIRECTED BY ANTICOAGULATION CLINIC.  The target INR is 2.0-3.0.  The next INR is due 10/30/2009.  Anticoagulation instructions were given to patient.  Results were reviewed/authorized by Edrick Oh, RN.  She was notified by Edrick Oh RN.         Prior Anticoagulation Instructions: INR 2.2 Continue coumadin 2.5mg  once daily   Current Anticoagulation Instructions: INR 2.4 Continue coumadin 2.5mg  once daily

## 2010-07-07 NOTE — Progress Notes (Signed)
   Phone Note From Pharmacy   Caller: Ramona Call For: simvastatin and diltiazem medication interaction  Summary of Call: S: pt takes diltiazem 360mg  daily and simvastatin 40mg  daily B: last official ov on 09/16/09 , no changes at that time A: please advise; no recent lipid profile R: Initial call taken by: Tye Savoy RN,  February 25, 2010 5:09 PM  Follow-up for Phone Call        Change simvastatin to pravastatin 80 mg once daily  FLP, CMet in 1 month.   Follow-up by: Yehuda Savannah, MD, [facility],  February 26, 2010 10:17 PM     Appended Document:     Clinical Lists Changes  Medications: Added new medication of PRAVASTATIN SODIUM 80 MG TABS (PRAVASTATIN SODIUM) Take one tablet by mouth daily at bedtime - Signed Rx of PRAVASTATIN SODIUM 80 MG TABS (PRAVASTATIN SODIUM) Take one tablet by mouth daily at bedtime;  #30 x 6;  Signed;  Entered by: Tye Savoy RN;  Authorized by: Yehuda Savannah, MD, [facility];  Method used: Electronically to [facility] [address], [place], [place], [place]  [address], Ph: [phone], Fax: [phone]    Prescriptions: PRAVASTATIN SODIUM 80 MG TABS (PRAVASTATIN SODIUM) Take one tablet by mouth daily at bedtime  #30 x 6   Entered by:   Tye Savoy RN   Authorized by:   Yehuda Savannah, MD, [facility]   Signed by:   Tye Savoy RN on 02/27/2010   Method used:   Electronically to        [facility] (retail)       [address]       [place], [place]  [address]       Ph: [phone]       Fax: [phone]   RxID:   [id number]    Appended Document: Orders Update    Clinical Lists Changes  Orders: Added new Test order of T-Lipid Profile [id number]) - Signed Added new Test order of T-Comprehensive Metabolic Panel ([id number]) - Signed

## 2010-07-07 NOTE — Miscellaneous (Signed)
Summary: hemoccult cards 04/13/2010  Clinical Lists Changes  Observations: Added new observation of HEMOCCULT 3: neg (04/13/2010 10:00) Added new observation of HEMOCCULT 2: neg (04/13/2010 10:00) Added new observation of HEMOCCULT 1: neg (04/13/2010 10:00)

## 2010-07-07 NOTE — Miscellaneous (Signed)
Summary: LABS CMP,CBCD,TSH,09/11/2009  Clinical Lists Changes  Observations: Added new observation of CALCIUM: 9.2 mg/dL (09/11/2009 16:04) Added new observation of ALBUMIN: 3.9 g/dL (09/11/2009 16:04) Added new observation of PROTEIN, TOT: 6.6 g/dL (09/11/2009 16:04) Added new observation of SGPT (ALT): 11 units/L (09/11/2009 16:04) Added new observation of SGOT (AST): 16 units/L (09/11/2009 16:04) Added new observation of ALK PHOS: 127 units/L (09/11/2009 16:04) Added new observation of GFR AA: >60 mL/min/1.52m2 (09/11/2009 16:04) Added new observation of GFR: 55 mL/min (09/11/2009 16:04) Added new observation of CREATININE: 0.98 mg/dL (09/11/2009 16:04) Added new observation of BUN: 12 mg/dL (09/11/2009 16:04) Added new observation of BG RANDOM: 86 mg/dL (09/11/2009 16:04) Added new observation of CO2 PLSM/SER: 25 meq/L (09/11/2009 16:04) Added new observation of CL SERUM: 100 meq/L (09/11/2009 16:04) Added new observation of K SERUM: 4.0 meq/L (09/11/2009 16:04) Added new observation of NA: 137 meq/L (09/11/2009 16:04) Added new observation of PLATELETK/UL: 181 K/uL (09/11/2009 16:04) Added new observation of MCV: 91.3 fL (09/11/2009 16:04) Added new observation of HCT: 43.2 % (09/11/2009 16:04) Added new observation of HGB: 13.6 g/dL (09/11/2009 16:04) Added new observation of WBC COUNT: 7.5 10*3/microliter (09/11/2009 16:04) Added new observation of TSH: 1.131 microintl units/mL (09/11/2009 16:04)

## 2010-07-07 NOTE — Letter (Signed)
Summary: ouotcomes medical record request  ouotcomes medical record request   Imported By: Ruffin Pyo 05/07/2010 14:04:51  _____________________________________________________________________  External Attachment:    Type:   Image     Comment:   External Document

## 2010-07-07 NOTE — Progress Notes (Signed)
Summary: transmitter   Phone Note Outgoing Call Call back at [phone]   Caller: Daughter/[name] Summary of Call: checking on status of transmitter Initial call taken by: Darnell Level,  November 24, 2009 2:04 PM Summary of Call: [facility] transmitter ordered on 11-24-09.  Spoke w/daughter and aware of transmitter coming in next 2 weeks.  Shelly Bombard  November 24, 2009 4:52 PM

## 2010-07-07 NOTE — Cardiovascular Report (Signed)
Summary: Office Visit Remote   Office Visit Remote   Imported By: Sallee Provencal 03/19/2010 10:59:35  _____________________________________________________________________  External Attachment:    Type:   Image     Comment:   External Document

## 2010-07-07 NOTE — Assessment & Plan Note (Signed)
Summary: 1 MTH F/U PER CHECKOUT ON 06/05/09/TG      Allergies Added:   Visit Type:  Follow-up Referring Provider:  Orthopaedics-Dr. Aline Brochure Primary Provider:  Dr. Orson Ape   History of Present Illness: Return visit for this very pleasant 77 year old woman who continues to recover from a total knee arthroplasty performed a few months ago.  She is walking fairly well with a cane.  At a recent office visit, she was told not to take dabigatran (pradaxa) by Dr. Aline Brochure.  I called to discuss this with him, but he could not recall the rationale for this action.  Ms. Baudin denies dyspnea, orthopnea, PND, pedal edema or any neurologic symptoms.   Current Medications (verified): 1)  Colcrys 0.6 Mg Tabs (Colchicine) .... Take 1 Tablet By Mouth Qd 2)  Zantac 150 Mg Tabs (Ranitidine Hcl) .... One By Mouth Bid 3)  Zocor 40 Mg Tabs (Simvastatin) .... One By Mouth Daily 4)  Allopurinol 100 Mg Tabs (Allopurinol) .... One By Mouth Daily 5)  Aspirin 325 Mg Tabs (Aspirin) .... One By Mouth Daily 6)  Meclizine Hcl 25 Mg Tabs (Meclizine Hcl) .Marland Kitchen.. 1 Tab Up To 4 Times Daily 7)  Diltiazem Hcl Er Beads 360 Mg Xr24h-Cap (Diltiazem Hcl Er Beads) .... Take 1 Tablet By Mouth Once Daily 8)  Warfarin Sodium 2.5 Mg Tabs (Warfarin Sodium) .... As Directed 9)  Norco 5-325 Mg Tabs (Hydrocodone-Acetaminophen) .Marland Kitchen.. 1 By Mouth Q 4 As Needed Pain 10)  Pravastatin Sodium 10 Mg Tabs (Pravastatin Sodium) .... Take One Tablet By Mouth Daily At Bedtime  Allergies (verified): 1)  ! Coumadin  Past History:  PMH, FH, and Social History reviewed and updated.  Review of Systems       see history of present illness  Vital Signs:  Patient profile:   77 year old female Weight:      180 pounds Pulse rate:   81 / minute BP sitting:   122 / 71  (right arm)  Vitals Entered By: Doretha Sou, CNA (June 30, 2009 1:27 PM)  Physical Exam  General:    Well developed, well nourished, in no acute distress; overweight;  engaged with a very positive mood Eyes:  PERRLA/EOM intact; conjunctiva and lids normal; multiple missing teeth Neck:  Neck supple, no JVD. No carotid bruits. Lungs:  Clear bilaterally to auscultation and percussion. Heart:  Normal first and second heart sounds; regular rhythm Abdomen:  Bowel sounds positive; abdomen soft and non-tender without masses, organomegaly, or hernias noted. No hepatosplenomegaly. Extremities-distal pulses intact; mild edema of the mid right leg; benign healed anterior incision with mild keloid formation; no fluctuance; no tenderness; good range of motion Skin-patches of increased pigmentation over the right leg in regions of previous bullous disease    PPM Specifications Following MD:  Thompson Grayer, MD     PPM Vendor:  [employer]     PPM Model Number:  [id number]     PPM Serial Number:  [id number] PPM DOI:  11/06/2008     PPM Implanting MD:  Thompson Grayer, MD  Lead 1    Location: RA     DOI: 11/06/2008     Model #: [id number]     Serial #: [id number]     Status: active Lead 2    Location: RV     DOI: 11/06/2008     Model #: [id number]     Serial #: [id number]     Status: active  Magnet Response Rate:  BOL 100 ERI  85  PPM Follow Up Pacer Dependent:  No      Episodes Coumadin:  No  Parameters Mode:  DDD     Lower Rate Limit:  60     Upper Rate Limit:  120 Paced AV Delay:  200     Sensed AV Delay:  200  Impression & Recommendations:  Problem # 1:  HYPERTENSION, BENIGN (ICD-401.1) Blood pressure control is excellent; current medication will be continued.  Problem # 2:  ATRIAL FIBRILLATION (ICD-427.31) Rhythm is minimally irregular at this visit;  patient remains in atrial fibrillation with a fairly stable and consistent R-R interval.  Diltiazem is providing excellent control of heart rate and will be continued.  Problem # 3:  COUMADIN THERAPY (ICD-V58.61) Patient has substantial risk for thromboembolic disease related to her atrial fibrillation and will resume full  anticoagulation with dabigatran.  Serial hepatic profiles will be obtained.  [name] in 4 months.  Serum electrolytes and renal function will be monitored in the interim.  Other Orders: Future Orders: T-Comprehensive Metabolic Panel ([id number]) ... 08/28/2009  Patient Instructions: 1)  Your physician recommends that you schedule a follow-up appointment in: 4 months 2)  Your physician recommends that you return for lab work in: 2 months 3)  Your physician has recommended you make the following change in your medication: restart pradexa 150mg  two times a day

## 2010-07-07 NOTE — Medication Information (Signed)
Summary: ccr-lr  Anticoagulant Therapy  Managed by: Edrick Oh, RN PCP: Dr. Alois Cliche MD: Lattie Haw MD, Herbie Baltimore Indication 1: Atrial Fibrillation (chronic) Lab Used: [facility] [place] Site: [place] INR POC 2.3  Dietary changes: no    Health status changes: no    Bleeding/hemorrhagic complications: no    Recent/future hospitalizations: no    Any changes in medication regimen? no    Recent/future dental: no  Any missed doses?: no       Is patient compliant with meds? yes       Allergies: No Known Drug Allergies  Anticoagulation Management History:      The patient is taking warfarin and comes in today for a routine follow up visit.  Warfarin therapy is being given due to chronic atrial fibrillation.  Positive risk factors for bleeding include an age of 26 years or older and history of CVA/TIA.  Negative risk factors for bleeding include no history of GI bleeding and absence of serious comorbidities.  The bleeding index is 'intermediate risk'.  Positive CHADS2 values include History of CHF, History of HTN, Age > 2 years old, and Prior Stroke/CVA/TIA.  Negative CHADS2 values include History of Diabetes.  The start date was 02/21/2009.  Her last INR was 4.33.  Anticoagulation responsible Zandra Lajeunesse: Lattie Haw MD, Herbie Baltimore.  INR POC: 2.3.  Cuvette Lot#: [id number].  Exp: 10/2010.    Anticoagulation Management Assessment/Plan:      The patient's current anticoagulation dose is Warfarin sodium 5 mg tabs: TAKE 1 TABLET DAILY AS DIRECTED BY ANTICOAGULATION CLINIC.  The target INR is 2.0-3.0.  The next INR is due 05/25/2010.  Anticoagulation instructions were given to patient.  Results were reviewed/authorized by Edrick Oh, RN.  She was notified by Edrick Oh RN.         Prior Anticoagulation Instructions: INR 2.2 Continue coumadin 2.5mg  once daily except 5mg  on Thursdays  Current Anticoagulation Instructions: INR 2.3 Continue coumadin 2.5mg  once daily except 5mg   on Thursdays

## 2010-07-07 NOTE — Assessment & Plan Note (Signed)
Summary: 1 M RE-CK/XRAYS/TKA SURG/BLUE MED/CAF   Visit Type:  postop visit Primary Provider:  Dr. Orson Ape  CC:  postoperative visit status post RIGHT total knee arthroplasty.  History of Present Illness: I saw [name] in the office today for a followup visit.  She is a 77 years old woman status post RIGHT total knee arthroplasty on November 16 with a Depew implant  Medication Norco 5 as needed.  Subjectives  Doing well, no pain at all, walking with cane, bending knees well.  Complains of no pain  medications Coumadin twice a day   Her knee looks great he has full extension excellent quad power and 110 of flexion incision looks good.  Skin has recovered nicely.  X-rays 3 views RIGHT total knee  The  alignment was normal, PTF reduced without tilt or subluxation, no evidence of loosening.  IMPRESSION: normal appearance of the implant   Assessment doing well plan for followup 6 weeks she can stop Coumadin    Allergies: No Known Drug Allergies   Other Orders: Post-Op Check [id number]) Knee x-ray,  3 views [id number])  Patient Instructions: 1)  STOP Coumadin 2)  Come back in 6 weeks for recheck

## 2010-07-07 NOTE — Medication Information (Signed)
Summary: CCR  Anticoagulant Therapy  Managed by: Edrick Oh, RN PCP: Dr. Alois Cliche MD: Domenic Polite MD, Mikeal Hawthorne Indication 1: Atrial Fibrillation (chronic) INR POC 5.7  Dietary changes: no    Health status changes: no    Bleeding/hemorrhagic complications: no    Recent/future hospitalizations: no    Any changes in medication regimen? no    Recent/future dental: no  Any missed doses?: no       Is patient compliant with meds? yes       Allergies: No Known Drug Allergies  Anticoagulation Management History:      The patient is taking warfarin and comes in today for a routine follow up visit.  The patient is taking warfarin for chronic atrial fibrillation.  Positive risk factors for bleeding include an age of 75 years or older and history of CVA/TIA.  Negative risk factors for bleeding include no history of GI bleeding and absence of serious comorbidities.  The bleeding index is 'intermediate risk'.  Positive CHADS2 values include History of CHF, History of HTN, Age > 79 years old, and Prior Stroke/CVA/TIA.  Negative CHADS2 values include History of Diabetes.  The start date was 02/21/2009.  Her last INR was 4.33.  Anticoagulation responsible provider: Domenic Polite MD, Mikeal Hawthorne.  INR POC: 5.7.  Exp: 10/2010.    Anticoagulation Management Assessment/Plan:      The patient's current anticoagulation dose is Warfarin sodium 5 mg tabs: TAKE 1 TABLET DAILY AS DIRECTED BY ANTICOAGULATION CLINIC.  The target INR is 2.0-3.0.  The next INR is due 10/01/2009.  Anticoagulation instructions were given to patient.  Results were reviewed/authorized by Edrick Oh, RN.  She was notified by Edrick Oh RN.         Prior Anticoagulation Instructions: INR 3.3 TODAY HOLD TODAY THEN BEGIN 1 TABLET MONDAY,WEDNESDAY AND FRIDAY, 1/2 TABLET ALL OTHER DAYS  Current Anticoagulation Instructions: INR 5.7 Hold coumadin x 3 days (Thursday, Friday and Saturday), then take 2.5mg  on Sunday. Monday and Tuesday

## 2010-07-07 NOTE — Medication Information (Signed)
Summary: ccr-lr  Anticoagulant Therapy  Managed by: Edrick Oh, RN PCP: Dr. Alois Cliche MD: Lattie Haw MD, Herbie Baltimore Indication 1: Atrial Fibrillation (chronic) Lab Used: [facility] [place] Site: [place] INR POC 1.7  Dietary changes: no    Health status changes: no    Bleeding/hemorrhagic complications: no    Recent/future hospitalizations: no    Any changes in medication regimen? no    Recent/future dental: no  Any missed doses?: no       Is patient compliant with meds? yes       Allergies: No Known Drug Allergies  Anticoagulation Management History:      The patient is taking warfarin and comes in today for a routine follow up visit.  The patient is on warfarin for chronic atrial fibrillation.  Positive risk factors for bleeding include an age of 75 years or older and history of CVA/TIA.  Negative risk factors for bleeding include no history of GI bleeding and absence of serious comorbidities.  The bleeding index is 'intermediate risk'.  Positive CHADS2 values include History of CHF, History of HTN, Age > 110 years old, and Prior Stroke/CVA/TIA.  Negative CHADS2 values include History of Diabetes.  The start date was 02/21/2009.  Her last INR was 4.33.  Anticoagulation responsible provider: Lattie Haw MD, Herbie Baltimore.  INR POC: 1.7.  Cuvette Lot#: [id number].  Exp: 10/2010.    Anticoagulation Management Assessment/Plan:      The patient's current anticoagulation dose is Warfarin sodium 5 mg tabs: TAKE 1 TABLET DAILY AS DIRECTED BY ANTICOAGULATION CLINIC.  The target INR is 2.0-3.0.  The next INR is due 12/24/2009.  Anticoagulation instructions were given to patient.  Results were reviewed/authorized by Edrick Oh, RN.  She was notified by Edrick Oh RN.         Prior Anticoagulation Instructions: INR 2.5 Continue coumadin 2.5mg  once daily   Current Anticoagulation Instructions: INR 1.7 Take coumadin 1 tablet tonight then resume 1/2 tablet once daily

## 2010-07-07 NOTE — Letter (Signed)
Summary: Office notes [facility]  Office notes [facility]   Imported By: Ihor Austin 07/22/2009 18:53:50  _____________________________________________________________________  External Attachment:    Type:   Image     Comment:   External Document

## 2010-07-09 ENCOUNTER — Ambulatory Visit: Admit: 2010-07-09 | Payer: Self-pay

## 2010-07-09 NOTE — Assessment & Plan Note (Signed)
Summary: YRLY RE-CHECK/XRAYS KNEE/TKA FOL/UP/BLUE MED/CAF   Visit Type:  Follow-up Referring Provider:  Orthopaedics-Dr. Aline Brochure Primary Provider:  Dr. Orson Ape  CC:  recheck TKA.  History of Present Illness: I saw [name] in the office today for a new problem visit.  She is a 77 years old woman with the complaint of:  yearly recheck with xrays TKA knee right.  04/22/09 DOS Depuy Implant.  Norco 5 for pain, takes because it makes her feel good, no pain.  Doing well with the knee.routine followup radiographs, RIGHT knee. Patient not having any difficulties.  Separate and Identifiable X-Ray report      RIGHT knee x-ray for routine followup postop total knee replacement The  alignment was normal, PTF reduced without tilt or subluxation, no evidence of loosening.  IMPRESSION: normal appearance of the implant      Allergies: No Known Drug Allergies  Review of Systems Musculoskeletal:  Denies joint pain, swelling, instability, stiffness, redness, heat, and muscle pain.   Impression & Recommendations:  Problem # 1:  TOTAL KNEE FOLLOW-UP (ICD-V43.65) Assessment Improved  Orders: Est. Patient Level III [id number]) Knee x-ray,  3 views [id number])  Patient Instructions: 1)  Nov 2012  2)  xrays left TKA    Orders Added: 1)  Est. Patient Level III [id number] 2)  Knee x-ray,  3 views [id number]

## 2010-07-09 NOTE — Medication Information (Signed)
Summary: ccr-lr  Anticoagulant Therapy  Managed by: Edrick Oh, RN PCP: Dr. Alois Cliche MD: Lattie Haw MD, Herbie Baltimore Indication 1: Atrial Fibrillation (chronic) Lab Used: [facility] [place] Site: [place] INR POC 2.3  Dietary changes: no    Health status changes: no    Bleeding/hemorrhagic complications: no    Recent/future hospitalizations: no    Any changes in medication regimen? no    Recent/future dental: no  Any missed doses?: no       Is patient compliant with meds? yes       Allergies: No Known Drug Allergies  Anticoagulation Management History:      The patient is taking warfarin and comes in today for a routine follow up visit.  Warfarin therapy is being given due to chronic atrial fibrillation.  Positive risk factors for bleeding include an age of 75 years or older and history of CVA/TIA.  Negative risk factors for bleeding include no history of GI bleeding and absence of serious comorbidities.  The bleeding index is 'intermediate risk'.  Positive CHADS2 values include History of CHF, History of HTN, Age > 75 years old, and Prior Stroke/CVA/TIA.  Negative CHADS2 values include History of Diabetes.  The start date was 02/21/2009.  Her last INR was 4.33.  Anticoagulation responsible provider: Lattie Haw MD, Herbie Baltimore.  INR POC: 2.3.  Exp: 10/2010.    Anticoagulation Management Assessment/Plan:      The patient's current anticoagulation dose is Warfarin sodium 5 mg tabs: TAKE 1 TABLET DAILY AS DIRECTED BY ANTICOAGULATION CLINIC.  The target INR is 2.0-3.0.  The next INR is due 07/09/2010.  Anticoagulation instructions were given to patient.  Results were reviewed/authorized by Edrick Oh, RN.  She was notified by Edrick Oh RN.         Prior Anticoagulation Instructions: INR 4.9 Has been on antibiotic Hold coumadin tonight and tomorrow night then resume 1/2 tablet once daily except 1 tablet on Thursdays  Current Anticoagulation Instructions: INR  2.3 Continue coumadin 2.5mg  once daily except 5mg  on Thursdays

## 2010-07-09 NOTE — Medication Information (Signed)
Summary: ccr-lr  Anticoagulant Therapy  Managed by: Edrick Oh, RN PCP: Dr. Alois Cliche MD: Lattie Haw MD, Herbie Baltimore Indication 1: Atrial Fibrillation (chronic) Lab Used: [facility] [place] Site: [place] INR POC 4.9  Dietary changes: no    Health status changes: yes       Details: had bronchitis  Bleeding/hemorrhagic complications: no    Recent/future hospitalizations: no    Any changes in medication regimen? yes       Details: was on Abx but finished it last week  Recent/future dental: no  Any missed doses?: no       Is patient compliant with meds? yes       Allergies: No Known Drug Allergies  Anticoagulation Management History:      The patient is taking warfarin and comes in today for a routine follow up visit.  Warfarin therapy is being given due to chronic atrial fibrillation.  Positive risk factors for bleeding include an age of 12 years or older and history of CVA/TIA.  Negative risk factors for bleeding include no history of GI bleeding and absence of serious comorbidities.  The bleeding index is 'intermediate risk'.  Positive CHADS2 values include History of CHF, History of HTN, Age > 44 years old, and Prior Stroke/CVA/TIA.  Negative CHADS2 values include History of Diabetes.  The start date was 02/21/2009.  Her last INR was 4.33.  Anticoagulation responsible provider: Lattie Haw MD, Herbie Baltimore.  INR POC: 4.9.  Cuvette Lot#: [id number].  Exp: 10/2010.    Anticoagulation Management Assessment/Plan:      The patient's current anticoagulation dose is Warfarin sodium 5 mg tabs: TAKE 1 TABLET DAILY AS DIRECTED BY ANTICOAGULATION CLINIC.  The target INR is 2.0-3.0.  The next INR is due 06/11/2010.  Anticoagulation instructions were given to patient.  Results were reviewed/authorized by Edrick Oh, RN.         Prior Anticoagulation Instructions: INR 2.3 Continue coumadin 2.5mg  once daily except 5mg  on Thursdays  Current Anticoagulation Instructions: INR  4.9 Has been on antibiotic Hold coumadin tonight and tomorrow night then resume 1/2 tablet once daily except 1 tablet on Thursdays

## 2010-07-09 NOTE — Letter (Signed)
Summary: Device-Delinquent Phone Proofreader, [place]  [address] [place]   [place], [place] [address]   Phone: [phone]  Fax: [phone]     June 16, 2010 MRN: [id number]   [facility] [address] [place], [place]  [address]   Dear [name],  According to our records, you were scheduled for a device phone transmission on  06-11-2010.     We did not receive any results from this check.  If you transmitted on your scheduled day, please call us to help troubleshoot your system.  If you forgot to send your transmission, please send one upon receipt of this letter.  Thank you,   [facility]

## 2010-07-16 ENCOUNTER — Encounter (INDEPENDENT_AMBULATORY_CARE_PROVIDER_SITE_OTHER): Payer: Medicare Other

## 2010-07-16 ENCOUNTER — Encounter: Payer: Self-pay | Admitting: Cardiology

## 2010-07-16 DIAGNOSIS — Z7901 Long term (current) use of anticoagulants: Secondary | ICD-10-CM

## 2010-07-16 DIAGNOSIS — I4891 Unspecified atrial fibrillation: Secondary | ICD-10-CM

## 2010-07-19 ENCOUNTER — Encounter (INDEPENDENT_AMBULATORY_CARE_PROVIDER_SITE_OTHER): Payer: Self-pay | Admitting: *Deleted

## 2010-07-23 NOTE — Medication Information (Signed)
Summary: protime/tg  Anticoagulant Therapy Managed by: Edrick Oh, RN Patient Assessment Part 2:  Have you MISSED ANY DOSES or CHANGED TABLETS?  0  Have you had any BRUISING or BLEEDING ( nose or gum bleeds,blood in urine or stool)?  Have you STARTED or STOPPED any MEDICATIONS, including OTC meds,herbals or supplements?  Have you CHANGED your DIET, especially green vegetables,or ALCOHOL intake?  Have you had any ILLNESSES or HOSPITALIZATIONS?  Have you had any signs of CLOTTING?(chest discomfort,dizziness,shortness of breath,arms tingling,slurred speech,swelling or redness in leg)      New  Tablet strength: : 5mg  Regimen Out:     Sunday: 0.5 tab Tablet     Monday: 0.5 tab Tablet     Tuesday: 0.5 tab Tablet     Wednesday: 0.5 tab Tablet     Thursday: 1 tab Tablet      Friday: 0.5 tab Tablet     Saturday: 0.5 tab Tablet Total Weekly: 20 mg mg  Next INR Due: 08/06/2010      Allergies: No Known Drug Allergies  Anticoagulant Therapy  Managed by: Edrick Oh, RN PCP: Dr. Alois Cliche MD: Domenic Polite MD, Mikeal Hawthorne Indication 1: Atrial Fibrillation (chronic) Lab Used: [facility] [place] Site: [place] INR POC 1.9  Dietary changes: no    Health status changes: no    Bleeding/hemorrhagic complications: no    Recent/future hospitalizations: no    Any changes in medication regimen? no    Recent/future dental: no  Any missed doses?: no       Is patient compliant with meds? yes         Anticoagulation Management History:      The patient is taking warfarin and comes in today for a routine follow up visit.  Anticoagulation is being administered due to chronic atrial fibrillation.  Positive risk factors for bleeding include an age of 8 years or older and history of CVA/TIA.  Negative risk factors for bleeding include no history of GI bleeding and absence of serious comorbidities.  The bleeding index is 'intermediate risk'.  Positive CHADS2 values  include History of CHF, History of HTN, Age > 72 years old, and Prior Stroke/CVA/TIA.  Negative CHADS2 values include History of Diabetes.  The start date was 02/21/2009.  Her last INR was 4.33.  Anticoagulation responsible provider: Domenic Polite MD, Mikeal Hawthorne.  INR POC: 1.9.  Cuvette Lot#: [id number].  Exp: 10/2010.    Anticoagulation Management Assessment/Plan:      The patient's current anticoagulation dose is Warfarin sodium 5 mg tabs: TAKE 1 TABLET DAILY AS DIRECTED BY ANTICOAGULATION CLINIC.  The target INR is 2.0-3.0.  The next INR is due 08/06/2010.  Anticoagulation instructions were given to patient.  Results were reviewed/authorized by Edrick Oh, RN.  She was notified by Edrick Oh RN.         Prior Anticoagulation Instructions: INR 2.3 Continue coumadin 2.5mg  once daily except 5mg  on Thursdays  Current Anticoagulation Instructions: INR 1.9 Continue coumadin 2.5mg  once daily except 5mg  on Thursdays Takes 5mg  tablet tonight May need smaller tablet strength next visit Call if you run out of tablets before next office visit

## 2010-07-29 NOTE — Cardiovascular Report (Signed)
Summary: Card [facility]   Imported By: Sallee Provencal 07/21/2010 14:41:55  _____________________________________________________________________  External Attachment:    Type:   Image     Comment:   External Document

## 2010-07-29 NOTE — Letter (Signed)
Summary: Remote Device Check  [place], [place]  [id number] [place]   [place], [place] [address]   Phone: [phone]  Fax: [phone]     July 19, 2010 MRN: [id number]   [facility] [address] [place],   [address]   Dear Ms. Chapa,   Your remote transmission was recieved and reviewed by your physician.  All diagnostics were within normal limits for you.  ___X___Your next office visit is scheduled for:  April 2012 with Dr Lovena Le.  Please call our office to schedule an appointment.    Sincerely,  Shelly Bombard

## 2010-08-06 ENCOUNTER — Encounter: Payer: Self-pay | Admitting: Cardiology

## 2010-08-06 ENCOUNTER — Encounter (INDEPENDENT_AMBULATORY_CARE_PROVIDER_SITE_OTHER): Payer: Medicare Other

## 2010-08-06 DIAGNOSIS — I4891 Unspecified atrial fibrillation: Secondary | ICD-10-CM

## 2010-08-06 DIAGNOSIS — Z7901 Long term (current) use of anticoagulants: Secondary | ICD-10-CM

## 2010-08-13 NOTE — Medication Information (Signed)
Summary: ccr-lr  Anticoagulant Therapy  Managed by: Edrick Oh, RN PCP: Dr. Alois Cliche MD: Domenic Polite MD, Mikeal Hawthorne Indication 1: Atrial Fibrillation (chronic) Lab Used: [facility] [place] Site: [place] INR POC 1.9  Dietary changes: no    Health status changes: no    Bleeding/hemorrhagic complications: no    Recent/future hospitalizations: no    Any changes in medication regimen? no    Recent/future dental: no  Any missed doses?: no       Is patient compliant with meds? yes       Allergies: No Known Drug Allergies  Anticoagulation Management History:      The patient is taking warfarin and comes in today for a routine follow up visit.  Anticoagulation is being administered due to chronic atrial fibrillation.  Positive risk factors for bleeding include an age of 75 years or older and history of CVA/TIA.  Negative risk factors for bleeding include no history of GI bleeding and absence of serious comorbidities.  The bleeding index is 'intermediate risk'.  Positive CHADS2 values include History of CHF, History of HTN, Age > 94 years old, and Prior Stroke/CVA/TIA.  Negative CHADS2 values include History of Diabetes.  The start date was 02/21/2009.  Her last INR was 4.33.  Anticoagulation responsible provider: Domenic Polite MD, Mikeal Hawthorne.  INR POC: 1.9.  Cuvette Lot#: [id number].  Exp: 10/2010.    Anticoagulation Management Assessment/Plan:      The patient's current anticoagulation dose is Warfarin sodium 5 mg tabs: TAKE 1 TABLET DAILY AS DIRECTED BY ANTICOAGULATION CLINIC.  The target INR is 2.0-3.0.  The next INR is due 08/26/2010.  Anticoagulation instructions were given to patient.  Results were reviewed/authorized by Edrick Oh, RN.  She was notified by Edrick Oh RN.         Prior Anticoagulation Instructions: INR 1.9 Continue coumadin 2.5mg  once daily except 5mg  on Thursdays Takes 5mg  tablet tonight May need smaller tablet strength next visit Call if you run out  of tablets before next office visit  Current Anticoagulation Instructions: INR 1.9 Increase coumadin to 2.5mg  once daily except 5mg  on Mondays and Thursdays Will start greens/salads 2 x week since increasing dose by 13% May need smaller tablet

## 2010-08-14 ENCOUNTER — Encounter: Payer: Self-pay | Admitting: *Deleted

## 2010-08-24 ENCOUNTER — Encounter: Payer: Self-pay | Admitting: Cardiology

## 2010-08-24 DIAGNOSIS — I4891 Unspecified atrial fibrillation: Secondary | ICD-10-CM

## 2010-08-24 DIAGNOSIS — I6350 Cerebral infarction due to unspecified occlusion or stenosis of unspecified cerebral artery: Secondary | ICD-10-CM

## 2010-08-26 ENCOUNTER — Encounter: Payer: Self-pay | Admitting: *Deleted

## 2010-08-26 ENCOUNTER — Encounter: Payer: Medicare Other | Admitting: *Deleted

## 2010-08-27 ENCOUNTER — Ambulatory Visit (INDEPENDENT_AMBULATORY_CARE_PROVIDER_SITE_OTHER): Payer: Medicare Other | Admitting: *Deleted

## 2010-08-27 DIAGNOSIS — I6350 Cerebral infarction due to unspecified occlusion or stenosis of unspecified cerebral artery: Secondary | ICD-10-CM

## 2010-08-27 DIAGNOSIS — I4891 Unspecified atrial fibrillation: Secondary | ICD-10-CM

## 2010-09-02 ENCOUNTER — Encounter: Payer: Self-pay | Admitting: Cardiology

## 2010-09-02 DIAGNOSIS — I4891 Unspecified atrial fibrillation: Secondary | ICD-10-CM

## 2010-09-02 DIAGNOSIS — Z7901 Long term (current) use of anticoagulants: Secondary | ICD-10-CM

## 2010-09-08 ENCOUNTER — Other Ambulatory Visit: Payer: Self-pay | Admitting: Cardiology

## 2010-09-09 LAB — CBC
HCT: 22.5 % — ABNORMAL LOW (ref 36.0–46.0)
HCT: 25.2 % — ABNORMAL LOW (ref 36.0–46.0)
HCT: 28.7 % — ABNORMAL LOW (ref 36.0–46.0)
HCT: 29.5 % — ABNORMAL LOW (ref 36.0–46.0)
HCT: 29.9 % — ABNORMAL LOW (ref 36.0–46.0)
HCT: 31.5 % — ABNORMAL LOW (ref 36.0–46.0)
Hemoglobin: 10.1 g/dL — ABNORMAL LOW (ref 12.0–15.0)
Hemoglobin: 10.5 g/dL — ABNORMAL LOW (ref 12.0–15.0)
Hemoglobin: 7.5 g/dL — ABNORMAL LOW (ref 12.0–15.0)
Hemoglobin: 8.3 g/dL — ABNORMAL LOW (ref 12.0–15.0)
Hemoglobin: 9.5 g/dL — ABNORMAL LOW (ref 12.0–15.0)
Hemoglobin: 9.9 g/dL — ABNORMAL LOW (ref 12.0–15.0)
MCHC: 33.5 g/dL (ref 30.0–36.0)
MCHC: 33.5 g/dL (ref 30.0–36.0)
MCHC: 33.9 g/dL (ref 30.0–36.0)
MCHC: 33.9 g/dL (ref 30.0–36.0)
MCHC: 34.1 g/dL (ref 30.0–36.0)
MCHC: 34.5 g/dL (ref 30.0–36.0)
MCV: 86 fL (ref 78.0–100.0)
MCV: 86.3 fL (ref 78.0–100.0)
MCV: 86.3 fL (ref 78.0–100.0)
MCV: 87 fL (ref 78.0–100.0)
MCV: 87.3 fL (ref 78.0–100.0)
MCV: 86.7 fL (ref 78.0–100.0)
Platelets: 189 10*3/uL (ref 150–400)
Platelets: 349 10*3/uL (ref 150–400)
Platelets: 188 10*3/uL (ref 150–400)
RBC: 2.61 MIL/uL — ABNORMAL LOW (ref 3.87–5.11)
RBC: 2.84 MIL/uL — ABNORMAL LOW (ref 3.87–5.11)
RBC: 3.63 MIL/uL — ABNORMAL LOW (ref 3.87–5.11)
RDW: 14.5 % (ref 11.5–15.5)
RDW: 15.1 % (ref 11.5–15.5)
RDW: 15.1 % (ref 11.5–15.5)
RDW: 15.3 % (ref 11.5–15.5)
RDW: 15.4 % (ref 11.5–15.5)
RDW: 15.5 % (ref 11.5–15.5)
RDW: 15.6 % — ABNORMAL HIGH (ref 11.5–15.5)
RDW: 15.8 % — ABNORMAL HIGH (ref 11.5–15.5)
WBC: 8 10*3/uL (ref 4.0–10.5)
WBC: 8 10*3/uL (ref 4.0–10.5)
WBC: 9.9 10*3/uL (ref 4.0–10.5)
WBC: 4.7 10*3/uL (ref 4.0–10.5)

## 2010-09-09 LAB — BASIC METABOLIC PANEL
BUN: 20 mg/dL (ref 6–23)
BUN: 21 mg/dL (ref 6–23)
BUN: 14 mg/dL (ref 6–23)
CO2: 26 mEq/L (ref 19–32)
CO2: 27 mEq/L (ref 19–32)
CO2: 28 mEq/L (ref 19–32)
CO2: 28 mEq/L (ref 19–32)
CO2: 29 mEq/L (ref 19–32)
CO2: 31 mEq/L (ref 19–32)
Calcium: 9.3 mg/dL (ref 8.4–10.5)
Calcium: 9.4 mg/dL (ref 8.4–10.5)
Calcium: 9.6 mg/dL (ref 8.4–10.5)
Calcium: 9.6 mg/dL (ref 8.4–10.5)
Calcium: 9.8 mg/dL (ref 8.4–10.5)
Chloride: 102 mEq/L (ref 96–112)
Chloride: 103 mEq/L (ref 96–112)
Chloride: 104 mEq/L (ref 96–112)
Chloride: 106 mEq/L (ref 96–112)
Chloride: 104 mEq/L (ref 96–112)
Creatinine, Ser: 0.84 mg/dL (ref 0.4–1.2)
Creatinine, Ser: 1.37 mg/dL — ABNORMAL HIGH (ref 0.4–1.2)
Creatinine, Ser: 1.08 mg/dL (ref 0.4–1.2)
GFR calc Af Amer: >60 mL/min (ref >60)
GFR calc Af Amer: >60 mL/min (ref >60)
GFR calc non Af Amer: 44 mL/min — ABNORMAL LOW (ref >60)
GFR calc non Af Amer: 49 mL/min — ABNORMAL LOW (ref >60)
GFR calc non Af Amer: 54 mL/min — ABNORMAL LOW (ref >60)
GFR calc non Af Amer: >60 mL/min (ref >60)
GFR calc non Af Amer: 49 mL/min — ABNORMAL LOW (ref >60)
Glucose, Bld: 104 mg/dL — ABNORMAL HIGH (ref 70–99)
Glucose, Bld: 184 mg/dL — ABNORMAL HIGH (ref 70–99)
Glucose, Bld: 76 mg/dL (ref 70–99)
Glucose, Bld: 92 mg/dL (ref 70–99)
Glucose, Bld: 96 mg/dL (ref 70–99)
Potassium: 3.9 mEq/L (ref 3.5–5.1)
Potassium: 4 mEq/L (ref 3.5–5.1)
Potassium: 4 mEq/L (ref 3.5–5.1)
Sodium: 133 mEq/L — ABNORMAL LOW (ref 135–145)
Sodium: 135 mEq/L (ref 135–145)
Sodium: 135 mEq/L (ref 135–145)
Sodium: 136 mEq/L (ref 135–145)
Sodium: 136 mEq/L (ref 135–145)
Sodium: 137 mEq/L (ref 135–145)

## 2010-09-09 LAB — CROSSMATCH: Antibody Screen: NEGATIVE

## 2010-09-09 LAB — DIFFERENTIAL
Basophils Absolute: 0 10*3/uL (ref 0.0–0.1)
Basophils Absolute: 0 10*3/uL (ref 0.0–0.1)
Basophils Absolute: 0 10*3/uL (ref 0.0–0.1)
Basophils Absolute: 0 10*3/uL (ref 0.0–0.1)
Basophils Absolute: 0 10*3/uL (ref 0.0–0.1)
Basophils Absolute: 0 10*3/uL (ref 0.0–0.1)
Basophils Absolute: 0 10*3/uL (ref 0.0–0.1)
Basophils Relative: 0 % (ref 0–1)
Basophils Relative: 0 % (ref 0–1)
Basophils Relative: 0 % (ref 0–1)
Basophils Relative: 0 % (ref 0–1)
Basophils Relative: 0 % (ref 0–1)
Basophils Relative: 0 % (ref 0–1)
Eosinophils Absolute: 0 10*3/uL (ref 0.0–0.7)
Eosinophils Absolute: 0 10*3/uL (ref 0.0–0.7)
Eosinophils Absolute: 0 10*3/uL (ref 0.0–0.7)
Eosinophils Absolute: 0.1 10*3/uL (ref 0.0–0.7)
Eosinophils Absolute: 0.1 10*3/uL (ref 0.0–0.7)
Eosinophils Absolute: 0.1 10*3/uL (ref 0.0–0.7)
Eosinophils Relative: 0 % (ref 0–5)
Eosinophils Relative: 0 % (ref 0–5)
Eosinophils Relative: 0 % (ref 0–5)
Eosinophils Relative: 0 % (ref 0–5)
Eosinophils Relative: 1 % (ref 0–5)
Eosinophils Relative: 1 % (ref 0–5)
Lymphocytes Relative: 10 % — ABNORMAL LOW (ref 12–46)
Lymphocytes Relative: 15 % (ref 12–46)
Lymphocytes Relative: 23 % (ref 12–46)
Lymphs Abs: 1.2 10*3/uL (ref 0.7–4.0)
Lymphs Abs: 1.9 10*3/uL (ref 0.7–4.0)
Lymphs Abs: 1.7 10*3/uL (ref 0.7–4.0)
Monocytes Absolute: 1.1 10*3/uL — ABNORMAL HIGH (ref 0.1–1.0)
Monocytes Absolute: 1.3 10*3/uL — ABNORMAL HIGH (ref 0.1–1.0)
Monocytes Absolute: 1.4 10*3/uL — ABNORMAL HIGH (ref 0.1–1.0)
Monocytes Absolute: 1.4 10*3/uL — ABNORMAL HIGH (ref 0.1–1.0)
Monocytes Absolute: 1.5 10*3/uL — ABNORMAL HIGH (ref 0.1–1.0)
Monocytes Absolute: 1.6 10*3/uL — ABNORMAL HIGH (ref 0.1–1.0)
Monocytes Absolute: 1.6 10*3/uL — ABNORMAL HIGH (ref 0.1–1.0)
Monocytes Absolute: 1.7 10*3/uL — ABNORMAL HIGH (ref 0.1–1.0)
Monocytes Relative: 14 % — ABNORMAL HIGH (ref 3–12)
Monocytes Relative: 16 % — ABNORMAL HIGH (ref 3–12)
Monocytes Relative: 17 % — ABNORMAL HIGH (ref 3–12)
Monocytes Relative: 20 % — ABNORMAL HIGH (ref 3–12)
Neutro Abs: 10 10*3/uL — ABNORMAL HIGH (ref 1.7–7.7)
Neutro Abs: 4.6 10*3/uL (ref 1.7–7.7)
Neutro Abs: 5.6 10*3/uL (ref 1.7–7.7)
Neutro Abs: 6.8 10*3/uL (ref 1.7–7.7)
Neutro Abs: 8.4 10*3/uL — ABNORMAL HIGH (ref 1.7–7.7)
Neutrophils Relative %: 67 % (ref 43–77)
Neutrophils Relative %: 78 % — ABNORMAL HIGH (ref 43–77)
Neutrophils Relative %: 54 % (ref 43–77)

## 2010-09-09 LAB — CULTURE, BLOOD (ROUTINE X 2)
Culture: NO GROWTH
Report Status: 11262010
Report Status: 11262010

## 2010-09-09 LAB — URINALYSIS, ROUTINE W REFLEX MICROSCOPIC
Nitrite: NEGATIVE
Specific Gravity, Urine: 1.02 (ref 1.005–1.030)
Urobilinogen, UA: 0.2 mg/dL (ref 0.0–1.0)

## 2010-09-09 LAB — COMPREHENSIVE METABOLIC PANEL
ALT: 13 U/L (ref 0–35)
AST: 20 U/L (ref 0–37)
Albumin: 2.1 g/dL — ABNORMAL LOW (ref 3.5–5.2)
Alkaline Phosphatase: 122 U/L — ABNORMAL HIGH (ref 39–117)
Chloride: 99 mEq/L (ref 96–112)
GFR calc Af Amer: 52 mL/min — ABNORMAL LOW (ref >60)
Potassium: 4 mEq/L (ref 3.5–5.1)
Sodium: 133 mEq/L — ABNORMAL LOW (ref 135–145)
Total Bilirubin: 1.2 mg/dL (ref 0.3–1.2)
Total Protein: 5.5 g/dL — ABNORMAL LOW (ref 6.0–8.3)

## 2010-09-09 LAB — PROTIME-INR
INR: 1.42 (ref 0.00–1.49)
INR: 1.45 (ref 0.00–1.49)
INR: 2.16 — ABNORMAL HIGH (ref 0.00–1.49)
INR: 1.09 (ref 0.00–1.49)
Prothrombin Time: 17.2 seconds — ABNORMAL HIGH (ref 11.6–15.2)
Prothrombin Time: 14.3 seconds (ref 11.6–15.2)

## 2010-09-09 LAB — URINE CULTURE: Colony Count: 85000

## 2010-09-09 LAB — URIC ACID: Uric Acid, Serum: 6.6 mg/dL (ref 2.4–7.0)

## 2010-09-09 LAB — URINE MICROSCOPIC-ADD ON

## 2010-09-09 LAB — PREPARE FRESH FROZEN PLASMA

## 2010-09-09 LAB — HEMOGLOBIN AND HEMATOCRIT, BLOOD
HCT: 32.3 % — ABNORMAL LOW (ref 36.0–46.0)
Hemoglobin: 11.1 g/dL — ABNORMAL LOW (ref 12.0–15.0)

## 2010-09-09 LAB — APTT: aPTT: 53 seconds — ABNORMAL HIGH (ref 24–37)

## 2010-09-09 LAB — HEPARIN LEVEL (UNFRACTIONATED): Heparin Unfractionated: 1.03 IU/mL — ABNORMAL HIGH (ref 0.30–0.70)

## 2010-09-14 LAB — HEPATIC FUNCTION PANEL
ALT: 17 U/L (ref 0–35)
AST: 21 U/L (ref 0–37)
Alkaline Phosphatase: 56 U/L (ref 39–117)
Bilirubin, Direct: 0.1 mg/dL (ref 0.0–0.3)
Indirect Bilirubin: 0.2 mg/dL — ABNORMAL LOW (ref 0.3–0.9)
Total Bilirubin: 0.3 mg/dL (ref 0.3–1.2)

## 2010-09-14 LAB — CBC
HCT: 29.3 % — ABNORMAL LOW (ref 36.0–46.0)
HCT: 29.5 % — ABNORMAL LOW (ref 36.0–46.0)
HCT: 29.9 % — ABNORMAL LOW (ref 36.0–46.0)
Hemoglobin: 10.1 g/dL — ABNORMAL LOW (ref 12.0–15.0)
Hemoglobin: 10.4 g/dL — ABNORMAL LOW (ref 12.0–15.0)
Hemoglobin: 10.2 g/dL — ABNORMAL LOW (ref 12.0–15.0)
MCHC: 35.2 g/dL (ref 30.0–36.0)
MCV: 90.3 fL (ref 78.0–100.0)
MCV: 91.2 fL (ref 78.0–100.0)
Platelets: 139 10*3/uL — ABNORMAL LOW (ref 150–400)
RBC: 3.24 MIL/uL — ABNORMAL LOW (ref 3.87–5.11)
RDW: 13.4 % (ref 11.5–15.5)
RDW: 13.5 % (ref 11.5–15.5)
RDW: 13.1 % (ref 11.5–15.5)

## 2010-09-14 LAB — DIFFERENTIAL
Basophils Absolute: 0 10*3/uL (ref 0.0–0.1)
Basophils Relative: 0 % (ref 0–1)
Eosinophils Relative: 3 % (ref 0–5)
Lymphocytes Relative: 32 % (ref 12–46)
Lymphs Abs: 0.7 10*3/uL (ref 0.7–4.0)
Monocytes Absolute: 0.4 10*3/uL (ref 0.1–1.0)
Monocytes Relative: 11 % (ref 3–12)
Monocytes Relative: 12 % (ref 3–12)
Neutro Abs: 1.6 10*3/uL — ABNORMAL LOW (ref 1.7–7.7)
Neutro Abs: 2.3 10*3/uL (ref 1.7–7.7)
Neutrophils Relative %: 66 % (ref 43–77)

## 2010-09-14 LAB — COMPREHENSIVE METABOLIC PANEL
BUN: 32 mg/dL — ABNORMAL HIGH (ref 6–23)
CO2: 20 mEq/L (ref 19–32)
Calcium: 9.4 mg/dL (ref 8.4–10.5)
GFR calc non Af Amer: 30 mL/min — ABNORMAL LOW (ref >60)
Glucose, Bld: 88 mg/dL (ref 70–99)
Total Protein: 5.1 g/dL — ABNORMAL LOW (ref 6.0–8.3)

## 2010-09-14 LAB — GLUCOSE, CAPILLARY
Glucose-Capillary: 104 mg/dL — ABNORMAL HIGH (ref 70–99)
Glucose-Capillary: 80 mg/dL (ref 70–99)
Glucose-Capillary: 85 mg/dL (ref 70–99)

## 2010-09-14 LAB — BASIC METABOLIC PANEL
BUN: 12 mg/dL (ref 6–23)
CO2: 22 mEq/L (ref 19–32)
Chloride: 118 mEq/L — ABNORMAL HIGH (ref 96–112)
GFR calc Af Amer: 48 mL/min — ABNORMAL LOW (ref >60)
GFR calc non Af Amer: 40 mL/min — ABNORMAL LOW (ref >60)
GFR calc non Af Amer: 41 mL/min — ABNORMAL LOW (ref >60)
Glucose, Bld: 78 mg/dL (ref 70–99)
Glucose, Bld: 80 mg/dL (ref 70–99)
Potassium: 3.8 mEq/L (ref 3.5–5.1)
Potassium: 3.9 mEq/L (ref 3.5–5.1)
Sodium: 142 mEq/L (ref 135–145)
Sodium: 144 mEq/L (ref 135–145)

## 2010-09-14 LAB — APTT: aPTT: 58 seconds — ABNORMAL HIGH (ref 24–37)

## 2010-09-14 LAB — PROTIME-INR: INR: 1.1 (ref 0.00–1.49)

## 2010-09-14 LAB — IRON AND TIBC: Saturation Ratios: 29 % (ref 20–55)

## 2010-09-15 LAB — BASIC METABOLIC PANEL
BUN: 55 mg/dL — ABNORMAL HIGH (ref 6–23)
BUN: 16 mg/dL (ref 6–23)
BUN: 21 mg/dL (ref 6–23)
BUN: 35 mg/dL — ABNORMAL HIGH (ref 6–23)
CO2: 25 mEq/L (ref 19–32)
Calcium: 9.3 mg/dL (ref 8.4–10.5)
Calcium: 9.4 mg/dL (ref 8.4–10.5)
Calcium: 9.5 mg/dL (ref 8.4–10.5)
Calcium: 9.7 mg/dL (ref 8.4–10.5)
Calcium: 9.7 mg/dL (ref 8.4–10.5)
Chloride: 108 mEq/L (ref 96–112)
Chloride: 110 mEq/L (ref 96–112)
Creatinine, Ser: 1.2 mg/dL (ref 0.4–1.2)
GFR calc Af Amer: 21 mL/min — ABNORMAL LOW (ref >60)
GFR calc Af Amer: 43 mL/min — ABNORMAL LOW (ref >60)
GFR calc Af Amer: 48 mL/min — ABNORMAL LOW (ref >60)
GFR calc non Af Amer: 15 mL/min — ABNORMAL LOW (ref >60)
GFR calc non Af Amer: 17 mL/min — ABNORMAL LOW (ref >60)
GFR calc non Af Amer: 32 mL/min — ABNORMAL LOW (ref >60)
GFR calc non Af Amer: 39 mL/min — ABNORMAL LOW (ref >60)
GFR calc non Af Amer: 40 mL/min — ABNORMAL LOW (ref >60)
GFR calc non Af Amer: 43 mL/min — ABNORMAL LOW (ref >60)
Glucose, Bld: 107 mg/dL — ABNORMAL HIGH (ref 70–99)
Glucose, Bld: 108 mg/dL — ABNORMAL HIGH (ref 70–99)
Glucose, Bld: 125 mg/dL — ABNORMAL HIGH (ref 70–99)
Glucose, Bld: 128 mg/dL — ABNORMAL HIGH (ref 70–99)
Glucose, Bld: 164 mg/dL — ABNORMAL HIGH (ref 70–99)
Potassium: 4.6 mEq/L (ref 3.5–5.1)
Potassium: 4.1 mEq/L (ref 3.5–5.1)
Potassium: 4.3 mEq/L (ref 3.5–5.1)
Potassium: 4.4 mEq/L (ref 3.5–5.1)
Sodium: 137 mEq/L (ref 135–145)
Sodium: 138 mEq/L (ref 135–145)
Sodium: 140 mEq/L (ref 135–145)
Sodium: 140 mEq/L (ref 135–145)
Sodium: 141 mEq/L (ref 135–145)

## 2010-09-15 LAB — DIFFERENTIAL
Basophils Absolute: 0 10*3/uL (ref 0.0–0.1)
Basophils Absolute: 0 10*3/uL (ref 0.0–0.1)
Basophils Absolute: 0 10*3/uL (ref 0.0–0.1)
Basophils Absolute: 0 10*3/uL (ref 0.0–0.1)
Basophils Absolute: 0 10*3/uL (ref 0.0–0.1)
Basophils Relative: 1 % (ref 0–1)
Basophils Relative: 1 % (ref 0–1)
Eosinophils Absolute: 0.1 10*3/uL (ref 0.0–0.7)
Eosinophils Absolute: 0.1 10*3/uL (ref 0.0–0.7)
Eosinophils Relative: 2 % (ref 0–5)
Eosinophils Relative: 1 % (ref 0–5)
Eosinophils Relative: 1 % (ref 0–5)
Eosinophils Relative: 1 % (ref 0–5)
Lymphocytes Relative: 27 % (ref 12–46)
Lymphocytes Relative: 30 % (ref 12–46)
Lymphocytes Relative: 17 % (ref 12–46)
Lymphocytes Relative: 25 % (ref 12–46)
Lymphocytes Relative: 27 % (ref 12–46)
Lymphocytes Relative: 29 % (ref 12–46)
Lymphocytes Relative: 35 % (ref 12–46)
Lymphs Abs: 1 10*3/uL (ref 0.7–4.0)
Lymphs Abs: 1.7 10*3/uL (ref 0.7–4.0)
Monocytes Absolute: 0.4 10*3/uL (ref 0.1–1.0)
Monocytes Absolute: 0.5 10*3/uL (ref 0.1–1.0)
Monocytes Absolute: 0.6 10*3/uL (ref 0.1–1.0)
Monocytes Absolute: 0.7 10*3/uL (ref 0.1–1.0)
Monocytes Absolute: 0.7 10*3/uL (ref 0.1–1.0)
Monocytes Relative: 12 % (ref 3–12)
Monocytes Relative: 10 % (ref 3–12)
Neutro Abs: 2 10*3/uL (ref 1.7–7.7)
Neutro Abs: 2.6 10*3/uL (ref 1.7–7.7)
Neutro Abs: 3.4 10*3/uL (ref 1.7–7.7)
Neutro Abs: 3.6 10*3/uL (ref 1.7–7.7)
Neutro Abs: 3.7 10*3/uL (ref 1.7–7.7)
Neutro Abs: 3.9 10*3/uL (ref 1.7–7.7)
Neutrophils Relative %: 72 % (ref 43–77)

## 2010-09-15 LAB — CARDIAC PANEL(CRET KIN+CKTOT+MB+TROPI)
CK, MB: 6.5 ng/mL — ABNORMAL HIGH (ref 0.3–4.0)
CK, MB: 6.9 ng/mL — ABNORMAL HIGH (ref 0.3–4.0)
CK, MB: 3.1 ng/mL (ref 0.3–4.0)
CK, MB: 3.9 ng/mL (ref 0.3–4.0)
Relative Index: INVALID (ref 0.0–2.5)
Relative Index: INVALID (ref 0.0–2.5)
Total CK: 55 U/L (ref 7–177)
Total CK: 60 U/L (ref 7–177)
Total CK: 68 U/L (ref 7–177)
Total CK: 46 U/L (ref 7–177)
Total CK: 51 U/L (ref 7–177)
Troponin I: 0.03 ng/mL (ref 0.00–0.06)
Troponin I: 0.03 ng/mL (ref 0.00–0.06)
Troponin I: 0.06 ng/mL (ref 0.00–0.06)

## 2010-09-15 LAB — CBC
HCT: 32.6 % — ABNORMAL LOW (ref 36.0–46.0)
HCT: 31.7 % — ABNORMAL LOW (ref 36.0–46.0)
HCT: 32.1 % — ABNORMAL LOW (ref 36.0–46.0)
HCT: 32.3 % — ABNORMAL LOW (ref 36.0–46.0)
HCT: 32.6 % — ABNORMAL LOW (ref 36.0–46.0)
Hemoglobin: 11 g/dL — ABNORMAL LOW (ref 12.0–15.0)
Hemoglobin: 11.5 g/dL — ABNORMAL LOW (ref 12.0–15.0)
Hemoglobin: 10.1 g/dL — ABNORMAL LOW (ref 12.0–15.0)
Hemoglobin: 10.8 g/dL — ABNORMAL LOW (ref 12.0–15.0)
Hemoglobin: 11 g/dL — ABNORMAL LOW (ref 12.0–15.0)
Hemoglobin: 11 g/dL — ABNORMAL LOW (ref 12.0–15.0)
MCHC: 34 g/dL (ref 30.0–36.0)
MCV: 89.6 fL (ref 78.0–100.0)
MCV: 89.8 fL (ref 78.0–100.0)
MCV: 90.3 fL (ref 78.0–100.0)
MCV: 90.9 fL (ref 78.0–100.0)
Platelets: 158 10*3/uL (ref 150–400)
Platelets: 190 10*3/uL (ref 150–400)
Platelets: 212 10*3/uL (ref 150–400)
Platelets: 213 10*3/uL (ref 150–400)
Platelets: 218 10*3/uL (ref 150–400)
RBC: 3.48 MIL/uL — ABNORMAL LOW (ref 3.87–5.11)
RBC: 3.51 MIL/uL — ABNORMAL LOW (ref 3.87–5.11)
RBC: 3.58 MIL/uL — ABNORMAL LOW (ref 3.87–5.11)
RDW: 13.4 % (ref 11.5–15.5)
RDW: 14 % (ref 11.5–15.5)
RDW: 14.4 % (ref 11.5–15.5)
RDW: 14.6 % (ref 11.5–15.5)
WBC: 3.3 10*3/uL — ABNORMAL LOW (ref 4.0–10.5)
WBC: 5.8 10*3/uL (ref 4.0–10.5)
WBC: 6.9 10*3/uL (ref 4.0–10.5)

## 2010-09-15 LAB — COMPREHENSIVE METABOLIC PANEL
Alkaline Phosphatase: 61 U/L (ref 39–117)
BUN: 34 mg/dL — ABNORMAL HIGH (ref 6–23)
CO2: 21 mEq/L (ref 19–32)
Chloride: 110 mEq/L (ref 96–112)
Creatinine, Ser: 1.55 mg/dL — ABNORMAL HIGH (ref 0.4–1.2)
GFR calc non Af Amer: 32 mL/min — ABNORMAL LOW (ref >60)
Glucose, Bld: 140 mg/dL — ABNORMAL HIGH (ref 70–99)
Potassium: 4 mEq/L (ref 3.5–5.1)
Total Bilirubin: 0.3 mg/dL (ref 0.3–1.2)

## 2010-09-15 LAB — URINALYSIS, ROUTINE W REFLEX MICROSCOPIC
Bilirubin Urine: NEGATIVE
Glucose, UA: NEGATIVE mg/dL
Hgb urine dipstick: NEGATIVE
Protein, ur: NEGATIVE mg/dL
Specific Gravity, Urine: 1.01 (ref 1.005–1.030)

## 2010-09-15 LAB — HEMOGLOBIN A1C: Hgb A1c MFr Bld: 6.5 % — ABNORMAL HIGH (ref 4.6–6.1)

## 2010-09-15 LAB — BLOOD GAS, ARTERIAL
Acid-base deficit: 4.1 mmol/L — ABNORMAL HIGH (ref 0.0–2.0)
Bicarbonate: 20.7 mEq/L (ref 20.0–24.0)
Patient temperature: 37
TCO2: 19.3 mmol/L (ref 0–100)
pCO2 arterial: 39.7 mmHg (ref 35.0–45.0)
pH, Arterial: 7.337 — ABNORMAL LOW (ref 7.350–7.400)

## 2010-09-15 LAB — HEPATIC FUNCTION PANEL
ALT: 10 U/L (ref 0–35)
AST: 13 U/L (ref 0–37)
Alkaline Phosphatase: 60 U/L (ref 39–117)
Indirect Bilirubin: 0.1 mg/dL — ABNORMAL LOW (ref 0.3–0.9)
Total Protein: 5.9 g/dL — ABNORMAL LOW (ref 6.0–8.3)

## 2010-09-15 LAB — MAGNESIUM: Magnesium: 2 mg/dL (ref 1.5–2.5)

## 2010-09-15 LAB — C-REACTIVE PROTEIN: CRP: 0.2 mg/dL — ABNORMAL LOW (ref <0.6)

## 2010-09-15 LAB — CALCIUM: Calcium: 9.9 mg/dL (ref 8.4–10.5)

## 2010-09-15 LAB — GLUCOSE, CAPILLARY

## 2010-09-16 LAB — POCT CARDIAC MARKERS
Myoglobin, poc: 104 ng/mL (ref 12–200)
Myoglobin, poc: 188 ng/mL (ref 12–200)

## 2010-09-16 LAB — DIFFERENTIAL
Basophils Absolute: 0 10*3/uL (ref 0.0–0.1)
Basophils Relative: 0 % (ref 0–1)
Lymphocytes Relative: 21 % (ref 12–46)
Neutro Abs: 6.1 10*3/uL (ref 1.7–7.7)
Neutrophils Relative %: 70 % (ref 43–77)

## 2010-09-16 LAB — D-DIMER, QUANTITATIVE: D-Dimer, Quant: 2.75 ug/mL-FEU — ABNORMAL HIGH (ref 0.00–0.48)

## 2010-09-16 LAB — URINALYSIS, MICROSCOPIC ONLY
Ketones, ur: NEGATIVE mg/dL
Leukocytes, UA: NEGATIVE
Nitrite: NEGATIVE
Specific Gravity, Urine: 1.02 (ref 1.005–1.030)
WBC, UA: NONE SEEN WBC/hpf (ref <3)
pH: 5 (ref 5.0–8.0)

## 2010-09-16 LAB — CBC
MCHC: 33.8 g/dL (ref 30.0–36.0)
Platelets: 233 10*3/uL (ref 150–400)
RDW: 14.1 % (ref 11.5–15.5)

## 2010-09-16 LAB — BASIC METABOLIC PANEL
BUN: 45 mg/dL — ABNORMAL HIGH (ref 6–23)
Chloride: 104 mEq/L (ref 96–112)
GFR calc Af Amer: 34 mL/min — ABNORMAL LOW (ref >60)
Potassium: 4.4 mEq/L (ref 3.5–5.1)
Sodium: 139 mEq/L (ref 135–145)

## 2010-09-16 LAB — CARDIAC PANEL(CRET KIN+CKTOT+MB+TROPI)
CK, MB: 1.9 ng/mL (ref 0.3–4.0)
CK, MB: 2.4 ng/mL (ref 0.3–4.0)
Troponin I: 0.02 ng/mL (ref 0.00–0.06)
Troponin I: 0.02 ng/mL (ref 0.00–0.06)
Troponin I: 0.03 ng/mL (ref 0.00–0.06)

## 2010-09-16 LAB — LIPID PANEL
Cholesterol: 122 mg/dL (ref 0–200)
LDL Cholesterol: 60 mg/dL (ref 0–99)
Triglycerides: 83 mg/dL (ref <150)

## 2010-09-16 LAB — BRAIN NATRIURETIC PEPTIDE: Pro B Natriuretic peptide (BNP): 254 pg/mL — ABNORMAL HIGH (ref 0.0–100.0)

## 2010-09-16 LAB — TSH: TSH: 0.805 u[IU]/mL (ref 0.350–4.500)

## 2010-09-22 ENCOUNTER — Ambulatory Visit (INDEPENDENT_AMBULATORY_CARE_PROVIDER_SITE_OTHER): Payer: Medicare Other | Admitting: Internal Medicine

## 2010-09-22 ENCOUNTER — Encounter: Payer: Self-pay | Admitting: Internal Medicine

## 2010-09-22 ENCOUNTER — Other Ambulatory Visit: Payer: Self-pay | Admitting: *Deleted

## 2010-09-22 ENCOUNTER — Ambulatory Visit (INDEPENDENT_AMBULATORY_CARE_PROVIDER_SITE_OTHER): Payer: Medicare Other | Admitting: *Deleted

## 2010-09-22 DIAGNOSIS — I509 Heart failure, unspecified: Secondary | ICD-10-CM

## 2010-09-22 DIAGNOSIS — G8929 Other chronic pain: Secondary | ICD-10-CM

## 2010-09-22 DIAGNOSIS — I4891 Unspecified atrial fibrillation: Secondary | ICD-10-CM

## 2010-09-22 DIAGNOSIS — I498 Other specified cardiac arrhythmias: Secondary | ICD-10-CM

## 2010-09-22 DIAGNOSIS — I10 Essential (primary) hypertension: Secondary | ICD-10-CM

## 2010-09-22 MED ORDER — HYDROCODONE-ACETAMINOPHEN 5-325 MG PO TABS: 1.0000 | ORAL_TABLET | Freq: Four times a day (QID) | ORAL | Status: DC | PRN

## 2010-09-22 NOTE — Assessment & Plan Note (Signed)
She has a well controlled ventricular rate. She will continue her current meds.

## 2010-09-22 NOTE — Assessment & Plan Note (Signed)
Her symptoms are well controlled. Continue a low sodium diet and her current meds.

## 2010-09-22 NOTE — Progress Notes (Signed)
HPI [name] returns today for followup. She is a pleasant elderly woman with a h/o symptomatic bradycardia and PAF, s/p PPM. She is on chronic coumadin therapy. She has a h/o multiple embolic events and is on coumadin. No Known Allergies   Current Outpatient Prescriptions  Medication Sig Dispense Refill  . albuterol (PROVENTIL) (2.5 MG/3ML) 0.083% nebulizer solution Take 2.5 mg by nebulization every 6 (six) hours as needed.        Marland Kitchen allopurinol (ZYLOPRIM) 100 MG tablet Take 100 mg by mouth daily.        . colchicine 0.6 MG tablet Take 0.6 mg by mouth daily.        Marland Kitchen diltiazem (CARDIZEM CD) 360 MG 24 hr capsule Take 360 mg by mouth daily.        . meclizine (ANTIVERT) 25 MG tablet Take 25 mg by mouth 3 (three) times daily as needed.        . ranitidine (ZANTAC) 150 MG tablet Take 150 mg by mouth 2 (two) times daily.        . simvastatin (ZOCOR) 40 MG tablet Take 40 mg by mouth at bedtime.        Marland Kitchen warfarin (COUMADIN) 5 MG tablet Take by mouth as directed.        Marland Kitchen DISCONTD: HYDROcodone-acetaminophen (NORCO) 5-325 MG per tablet Take 1 tablet by mouth every 6 (six) hours as needed.        Marland Kitchen HYDROcodone-acetaminophen (NORCO) 5-325 MG per tablet Take 1 tablet by mouth every 6 (six) hours as needed.  42 tablet  1  . PRAVACHOL 80 MG tablet TAKE (1) TABLET BY MOUTH AT BEDTIME.  30 each  3     Past Medical History  Diagnosis Date  . Tachycardia-bradycardia syndrome     with pauses and syncope;PPM implantation 10/2008,negative stress nuclear study 03/2005  . Atrial fibrillation     persistant  . Mitral annular calcification   . Hypertension     heart disease diastolic dysfunction ;mild pulmonary edema in 2006;responded to diurectics  ventricular hypertrophy  . Chronic renal insufficiency     baseline creatine 1.4  . Hyperlipidemia   . Angioedema   . Anemia     H/H of 11.3/36.7 in 12/2008 ;normal MCV  . Gout   . DJD (degenerative joint disease)     knees ;right TKA 04/2009  .  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 initially trated with asa but subsequently changed to coumadin  when found to have rheumatic mitral valve disease carotid duplex mild plaque in 3/*2010    ROS:   All systems reviewed and negative except as noted in the HPI.   Past Surgical History  Procedure Date  . Insert / replace / remove pacemaker     St.Jude DUAL chamber pacemaker 11/06/2008  . Right tka 04/2009     No family history on file.   History   Social History  . Marital Status: Married    Spouse Name: N/A    Number of Children: N/A  . Years of Education: N/A   Occupational History  . Not on file.   Social History Main Topics  . Smoking status: Never Smoker   . Smokeless tobacco: Never Used  . Alcohol Use: No  . Drug Use: No  . Sexually Active: Not on file   Other Topics Concern  . Not on file   Social  History Narrative  . No narrative on file     BP 137/80  Pulse 95  Ht 5\' 2"  (1.575 m)  Wt 185 lb (83.915 kg)  BMI 33.84 kg/m2  Physical Exam:  Well appearing NAD HEENT: Unremarkable Neck:  No JVD, no thyromegally Lymphatics:  No adenopathy Back:  No CVA tenderness Lungs:  Clear HEART:  Regular rate rhythm, no murmurs, no rubs, no clicks Abd:  Flat, positive bowel sounds, no organomegally, no rebound, no guarding Ext:  2 plus pulses, no edema, no cyanosis, no clubbing Skin:  No rashes no nodules Neuro:  CN II through XII intact, motor grossly intact  DEVICE  Normal device function.  See PaceArt for details.   Assess/Plan:

## 2010-09-22 NOTE — Assessment & Plan Note (Signed)
Her blood pressure is fairly well controlled. She has had some dietary indiscretion. I have encouraged she maintain a low sodium diet.

## 2010-09-22 NOTE — Patient Instructions (Signed)
Your physician recommends that you schedule a follow-up appointment in: 1 year  

## 2010-09-22 NOTE — Patient Instructions (Signed)
Hold today's dose of coumadin then resume your regular dose of 1 tablet on MWF and 0.5 tablet all other days.  Recheck inr in 1 month

## 2010-09-24 ENCOUNTER — Encounter: Payer: Medicare Other | Admitting: *Deleted

## 2010-10-20 NOTE — Letter (Signed)
Oct 24, 2007    Leonides Grills, M.D.  [address], [place]  [id number]   RE:  [name], [name]  MRN:  [id number]  /  DOB:  1927/02/26   Dear Rush Landmark,   [name] returns to the office for continued assessment treatment  of hypertension and history of CVA.  She has done well with respect to  those problems in recent months.  Unfortunately, she developed an  episode of dizziness with near syncope.  She was evaluated and treated  with meclizine, which did not apparently provide significant benefit.  She has discontinued that medication.  She had a second spell while  cooking few weeks ago.  She has been well since.   CURRENT MEDICATIONS:  1. KCl 20 mEq daily.  2. Furosemide 60 mg daily.  3. Atenolol 50 mg daily.  4. Amlodipine 5 mg daily.   PHYSICAL EXAMINATION:  GENERAL:  A pleasant overweight woman in no acute  distress.  The weight is 206, 4 pounds more than in February.  VITAL SIGNS:  Blood pressure 140/65 lying and 130/60 standing, heart  rate 65 and unchanged, respirations 14.  NECK:  No jugular venous distention; normal carotid upstrokes without  bruits.  CARDIAC:  Normal first and second heart sounds; fourth heart sound and  modest systolic ejection murmur.  LUNGS:  Minimal bibasilar rales.  ABDOMEN:  Soft and nontender; no masses; no organomegaly.  EXTREMITIES:  No edema.Marland Kitchen  NEUROLOGIC:  Symmetric strength and tone; normal cranial nerves.   IMPRESSION:  [name] has symptoms compatible with orthostatic  hypotension, but has noted demonstrable significant drop in blood  pressure today.  I advised her about the proper behavioral measures to  control these symptoms.  We will decrease furosemide to 40 mg daily with  60 mg to be used on a p.r.n. basis.  I will check a chemistry profile  and plan to reassess this nice woman in 6 months.  If episodes of near-  syncope continue, repeat carotid ultrasound study would be in order.  An  examination 2 years ago  showed no significant focal stenosis.    Sincerely,      Cristopher Estimable. Lattie Haw, MD, [facility]  Electronically Signed    RMR/MedQ  DD: 10/24/2007  DT: 10/24/2007  Job #: [id number]

## 2010-10-20 NOTE — Letter (Signed)
November 28, 2008    Leonides Grills, MD  [address], [place] [address]   RE:  [name], [name]  MRN:  [id number]  /  DOB:  May 05, 1927   Dear Rush Landmark:   [name] returns to the office following a recent hospitalization  and transferred to [facility] for a sick sinus syndrome.  She required  implantation of a permanent pacemaker.  Since that was accomplished, she  has had no recurrent spells of syncope.  She developed an acute  exacerbation of her chronic renal disease in-hospital prompting a change  in her medications.  Creatinine return to near normal.  She was anemic,  but only minimally so at the time of hospitalization.  Since returning  home, she has gradually recovered her strength and denies all  cardiopulmonary symptoms.   CURRENT MEDICATIONS:  1. Aspirin 325 mg daily.  2. Ranitidine 150 mg b.i.d.  3. Simvastatin 40 mg daily.  4. Allopurinol 100 mg daily.  5. Doxazosin 2 mg daily.  6. Colchicine 0.6 mg daily.  7. Diltiazem 180 mg daily.  8. Senokot 2 tablets q.h.s.   PHYSICAL EXAMINATION:  GENERAL:  A pleasant White in no acute distress.  VITAL SIGNS:  The weight is 186, 2 pounds less than in April.  Blood  pressure 145/70, heart rate 80 and regular, respirations 12 and  unlabored.  NECK:  No jugular venous distention.  LUNGS:  Clear.  THORAX:  Pacemaker implant in the left subclavicular region; well-healed  incision; slight puffiness, erythema and warmth over the implantation  site.  CARDIAC:  Normal first and second heart sounds; grade 2/6 early peaking  systolic ejection murmur.  ABDOMEN:  Soft and nontender; no organomegaly.  EXTREMITIES:  Trace edema.   LABORATORY STUDIES:  Recent laboratory notable for a hemoglobin of 10.2,  normal capillary blood glucose values, a creatinine of 1.26, an  excellent lipid profile and a BNP level of 256.   IMPRESSION:  [name] is doing extremely well overall.  I am  somewhat concerned about her pacemaker  site.  She is cautioned to watch  it and report any increased swelling or any new tenderness.  She will  take her temperature at home.  She will also follow blood pressures at  home.  With a history of congestive heart failure with preserved left  ventricular systolic function, I am concerned that Meghan may recur off of  diuretics; however, maintenance of normal renal function is also  desirable.  We will continue to watch her closely.  She will watch her  weight at home and report any gain of 4 pounds.  Chemistry profile and  CBC will be repeated in few weeks.  I will see [name] again in  1 month.  She will also be followed in our pacemaker clinic.    Sincerely,      Cristopher Estimable. Lattie Haw, MD, [facility]  Electronically Signed    RMR/MedQ  DD: 11/28/2008  DT: 11/29/2008  Job #: [id number]

## 2010-10-20 NOTE — Consult Note (Signed)
NAMEJOSLYNNE, White NO.:  [id number]   MEDICAL RECORD NO.:  [id number]          PATIENT TYPE:  INP   LOCATION:  3709                         FACILITY:  [facility]   PHYSICIAN:  Thompson Grayer, MD       DATE OF BIRTH:  Feb 28, 1927   DATE OF CONSULTATION:  DATE OF DISCHARGE:                                 CONSULTATION   REQUESTING PHYSICIAN:  Cristopher Estimable. Lattie Haw, MD, [place] FOR CONSULTATION:  Bradycardia.   HISTORY OF PRESENT ILLNESS:  [name] is a pleasant 76 year old  female with a history of recurrent syncope, seizure disorder, and  hypertension who was admitted to [facility] for further  evaluation and management of syncope.  She was previously having  multiple episodes of syncope during bowel movements for which she was  evaluated and thought to have vasovagal syncope.  She has also had  several episodes of stares as well as brief generalized tonic-clonic  seizure activity for which she was recently initiated on Keppra.  During  her most recent hospitalization, she was admitted with atrial  fibrillation with rapid ventricular rate.  She was subsequently observed  to have a 6-second pause with symptoms of dizziness and presyncope.  She  therefore has not been able to be treated with AV nodal blocking agent  including metoprolol, diltiazem.  Ventricular rate control of her atrial  fibrillation was felt to be difficult.  The patient denies episodes of  chest pain, shortness of breath, orthopnea, PND, or other concerns.  She  was briefly treated with Coumadin following stroke in 2006, but  subsequently had her Coumadin discontinued after subarachnoid  hemorrhage.  She denies any neurologic sequelae and is otherwise without  complaint.   PAST MEDICAL HISTORY:  1. Tachycardia-bradycardia syndrome (as above).  2. Paroxysmal atrial fibrillation.  3. Diastolic dysfunction.  4. Ventricular hypertrophy.  5. Hypertension.  6. Chronic renal  insufficiency, baseline creatinine 1.4.  7. Hyperlipidemia.  8. Gout.  9. Degenerative joint disease   ALLERGIES:  No known drug allergies.   CURRENT MEDICATIONS:  1. Lisinopril 40 mg daily.  2. Lasix 40 mg daily.  3. Aspirin 325 mg daily.  4. Ranitidine 150 mg daily.  5. Zocor 40 mg at bedtime.  6. Doxazosin 2 mg at bedtime.  7. Keppra 250 mg b.i.d.  8. Allopurinol 100 mg daily.  9. Colchicine 0.6 mg daily.  10.Meclizine p.r.n.  11.Reglan p.r.n.   SOCIAL HISTORY:  The patient lives in [place] with her spouse.  She denies tobacco, alcohol, or drug use.   FAMILY HISTORY:  Notable for coronary artery disease.   REVIEW OF SYSTEMS:  All systems were reviewed and negative except as  outlined in the HPI above.   PHYSICAL EXAMINATION:  Telemetry reveals sinus rhythm at 80 beats per  minute.  VITAL SIGNS:  Blood pressure 130/72, heart rate 76, respirations 20, and  sats 99% on 2 L, afebrile.  GENERAL:  The patient is an elderly female in no acute distress.  She is  alert and oriented x3.  HEENT:  Normocephalic and atraumatic.  Sclerae  clear.  Conjunctivae  pink.  Oropharynx clear.  NECK:  Supple.  No thyromegaly, JVD, or bruits.  LUNGS:  Clear to auscultation bilaterally.  HEART:  Regular rate and rhythm.  No murmurs, rubs, or gallops.  GI:  Soft, nontender, and nondistended.  Positive bowel sounds.  EXTREMITIES:  No clubbing, cyanosis, or edema.  NEUROLOGIC:  Cranial nerves II through XII are intact.  Strength and  sensation are intact.  SKIN:  No ecchymosis or lacerations.  MUSCULOSKELETAL:  No deformity or atrophy.  PSYCHIATRIC:  Euthymic mood.  Full affect.   EKG reveals atrial fibrillation with ventricular rates of 100 beats per  minute.  An EKG from Nov 04, 2008, reveals sinus rhythm at 80 beats per  minute with a first-degree AV block and nonspecific ST/T-wave changes.  Telemetry reviewed from [facility] reveals a post-termination pause of 6  seconds as well  as atrial fibrillation with ventricular rates in the  20s.   LABORATORY DATA:  Creatinine 1.3, hematocrit 29, white blood cell count  3.1, platelets 152, INR 1.1.   IMPRESSION:  Ms. Boodoo is a pleasant 76 year old female who presents  for EP consultation and management of tachycardia-bradycardia syndrome.  She has atrial fibrillation with rapid ventricular rate as well as  atrial fibrillation with significant bradycardias including a pause of  up to 6 seconds duration.  She has had multiple syncopal episodes as  well as presyncope during her recent 6-second pause.  Treatment of the  patient's tachycardia has been limited by her symptomatic bradycardia.  I therefore feel that pacemaker implantation if necessary.   PLAN:  Therapeutic strategies for atrial fibrillation and tachycardia-  bradycardia syndrome with symptomatic bradycardia were discussed in  detail with the patient and her family today.  Risks, benefits, and  alternatives to dual-chamber pacemaker implantation were discussed in  detail with the patient and her family.  They understand that the  risk include, but are not limited to bleeding, infection, pneumothorax,  tamponade perforation, lead dislodgement, worsening renal failure,  death, heart attack, and stroke.  They understand these risks and wishes  to proceed.  We will therefore proceed with pacemaker implantation at  the next available time.      Thompson Grayer, MD  Electronically Signed     JA/MEDQ  D:  11/06/2008  T:  11/07/2008  Job:  [id number]   cc:   Cristopher Estimable. Lattie Haw, MD, [facility]

## 2010-10-20 NOTE — Op Note (Signed)
[name], [name] NO.:  [id number]   MEDICAL RECORD NO.:  [id number]          PATIENT TYPE:  INP   LOCATION:  3709                         FACILITY:  [facility]   PHYSICIAN:  Thompson Grayer, MD       DATE OF BIRTH:  14-Mar-1927   DATE OF PROCEDURE:  11/06/2008  DATE OF DISCHARGE:                               OPERATIVE REPORT   SURGEON:  Thompson Grayer, MD   PREPROCEDURE DIAGNOSES:  1. Tachycardia-bradycardia syndrome.  2. Sinus node dysfunction.  3. Paroxysmal atrial fibrillation.  4. Syncope.   POSTPROCEDURE DIAGNOSES:  1. Tachycardia-bradycardia syndrome.  2. Sinus node dysfunction.  3. Paroxysmal atrial fibrillation.  4. Syncope.   PROCEDURES:  1. Left upper extremity venography.  2. Dual-chamber pacemaker implantation.   INTRODUCTION:  [name] is a pleasant 77 year old female with a  history of paroxysmal atrial fibrillation with rapid ventricular rates  and prior syncope who was admitted to [facility] with  symptomatic atrial fibrillation with RVR.  She is also observed to have  a 6-second pause with symptoms of presyncope during her hospitalization  as well as frequent bradycardia with heart rates at times in the 30s.  She therefore could not be initiated on AV nodal blocking agents.  She  also has a history of multiple episodes of syncope.  She now presents  for dual-chamber pacemaker implantation.   DESCRIPTION OF PROCEDURE:  An informed written consent was obtained and  the patient was brought to the electrophysiology lab in the fasting  state.  She was adequately sedated with intravenous Valium and fentanyl  as outlined in the nursing report.  The patient's left chest was prepped  and draped in the usual sterile fashion by the EP lab staff.  The skin  overlying the left deltopectoral region was infiltrated with lidocaine  for local analgesia.  A 5-cm incision was made over the left  deltopectoral region.  A left subcutaneous  pacemaker pocket was  fashioned using a combination of sharp and blunt dissection.  Electrocautery was used to assure hemostasis.  A venogram of the left  upper extremity was performed, which revealed a patent left axillary and  left subclavian veins.  A prominent left cephalic vein was also  observed, but could not be directly visualized due to a large amount of  subcutaneous adipose tissue.  Using a percutaneous Seldinger technique,  the left axillary vein was therefore cannulated.  Through the left  axillary vein, a St. Jude Medical Tendril SDX, model 1688TC - 52 (serial  number [id number]) right atrial lead and a St. Jude Medical Tendril SDX,  model 1688TC - 58 (serial number [id number]) right ventricular lead were  advanced with fluoroscopic visualization into the right atrial appendage  and mid right ventricular septal positions respectively.  Initial lead  measurements revealed an atrial lead P-wave of 2.4 mV with impedance of  612 ohms and a threshold of 0.5 V at 0.5 msec.  The right ventricular  lead R-wave measured 18 mV with impedance of 717 ohms and a threshold of  0.5 V at 0.5 msec.  The  leads were then secured to the pectoralis fascia  using #2 silk suture over the suture sleeves.  The pacemaker pocket was  then irrigated with copious gentamicin solution.  Electrocautery was  again used to assure hemostasis.  The pacemaker leads were then  connected to a [employer]corn RF DR, model [id number] (serial  number [id number]) dual-chamber pacemaker.  The pacemaker was then secured  to the pectoralis fascia using a single #2 silk suture.  The pocket was  then closed in 2 layers with 2.0 Vicryl suture for the subcutaneous and  subcuticular layers.  Steri-Strips and a sterile dressing were then  applied.  There were no early apparent complications.   CONCLUSIONS:  1. Sinus node dysfunction with symptomatic bradycardia and tachycardia-      bradycardia syndrome.  2. Successful  dual-chamber pacemaker implantation as described above.  3. No early apparent complications.      Thompson Grayer, MD  Electronically Signed     JA/MEDQ  D:  11/06/2008  T:  11/07/2008  Job:  [id number]   cc:   Cristopher Estimable. Lattie Haw, MD, [facility]

## 2010-10-20 NOTE — Letter (Signed)
November 29, 2006    Leonides Grills, M.D.  [address], [place] [address]   RE:  GHAIDA, ERBES  MRN:  [id number]  /  DOB:  1926-07-26   Dear Rush Landmark:   Ms. Loye returns to the office for continued assessment of left arm  nd chest pain.  She initially improved with antiinflammatory agents, but  subsequently tried to prevent a family member from falling to the ground  after transient loss of consciousness, resulting in re-injury of the  arm.  Nonetheless, it appears to be feeling better than the first time I  saw her.   Her chart was reviewed.  She had a TEE suggesting mitral valve  thickening, but a recent echocardiogram did not verify the presence of  rheumatic mitral valve disease.  There is substantial annular  calcification with mild stenosis and mild regurgitation.  The patient  initially suffered an episode of amaurosis fugax, thought to be  cardioembolic, but had moderate atherosclerotic disease on carotid  ultrasound.  She also has hyperlipidemia with a total cholesterol in  excess of 200 and an LDL of 140.   EXAM:  Pleasant woman in no acute distress.  The weight is 207, 3 pounds less than 3 weeks ago.  Blood pressure  140/60, heart rate 60 and regular, respirations 18.  NECK:  No jugular venous distension; no carotid bruits.  LUNGS:  Clear.  CARDIAC:  Normal first and second heart sounds; minimal basalar systolic  murmur.  ABDOMEN:  Soft and nontender; no organomegaly.  EXTREMITIES:  1+ pretibial edema.   IMPRESSION:  Ms. Akin has musculoskeletal symptoms.  She will call  your office to determine if you would like to assess her for this or  have her seen by an orthopedic surgeon.  I  have asked her to start simvastatin 40 mg daily in the setting of known  symptomatic cerebrovascular disease.  We will reassess a chemistry  profile and lipid profile in 2 months.  [name]  in 6 months.  Due to new criteria, she no longer needs  subacute  bacterial endocarditis prophylaxis.    Sincerely,      Cristopher Estimable. Lattie Haw, MD, [facility]  Electronically Signed    RMR/MedQ  DD: 11/29/2006  DT: 11/29/2006  Job #: [id number]

## 2010-10-20 NOTE — Discharge Summary (Signed)
[name], [name]             ACCOUNT NO.:  [id number]   MEDICAL RECORD NO.:  [id number]          PATIENT TYPE:  INP   LOCATION:  A306                          FACILITY:  [facility]   PHYSICIAN:  Anselmo Pickler, DO    DATE OF BIRTH:  1927/03/08   DATE OF ADMISSION:  10/03/2008  DATE OF DISCHARGE:  LH                               DISCHARGE SUMMARY   ADMISSION DIAGNOSES:  1. Syncope.  2. History of diastolic heart failure.  3. History of gout.  4. History of chronic kidney disease.   DISCHARGE DIAGNOSES:  1. Seizures.  2. Syncopal episodes, possibly secondary to seizures.  3. History of diastolic heart failure.  4. History of gout.  5. History of chronic kidney disease.   CONSULTS:  [facility]. __________  2. She had an EEG read by Dr. Merlene Laughter.  3. On the 29th she had a portable chest which demonstrated      cardiomegaly, no acute disease.  4. CT of the head demonstrated no acute intracranial abnormality,      atrophy, small chronic small vessel disease.  5. MRI without contrast demonstrates no acute intracranial      abnormality, severe chronic small vessel ischemic sequela with      progression since 2007.  6. She had a nuclear medicine VQ scan which was negative, low      probability for PE.  7. EEG on April 30 which there is an abnormal recording showed      multiple episodes of left temporal epileptiform discharges although      there is no evidence of ongoing electrographic seizures.  Given the      history consider anti-seizure medications.   HOSPITAL COURSE:  The patient is a 77 year old African American female  who presented to the [facility] Emergency Room after being in her usual  state of health and was transferred to her wheelchair after complaining  of nausea.  One of her daughters came to her bedside, she was sweaty and  she started to complain of some chest pain and shortness of breath.  They said that she passed out and was unarousable.  She  was then brought  to the emergency room.  Family stated that this had happen before, even  though the patient denied any focal weakness.  She was admitted to the  service of [facility] where Cardiology was consulted to see her, they  will follow up with her outpatient to see if there are any other issues.  In June also she had an EEG that was read that was interpreted to have  found some seizure activity.  The plan for today, the patient has not  had any seizures so we will go ahead and discharge her to home and start  her on new seizure medications.  She will go home on the following  medications which include:  1. Potassium chloride 20 mEq twice a day.  2. Lasix 40 mg daily.  3. Lisinopril 40 mg daily.  4. Aspirin 325 p.o. daily.  5. Catapres has been decreased to 0.1 mg, take 1 tablet a day  for 1      week then stop.  6. Zantac 150 mg two times a day.  7. Zocor 40 mg at bedtime.  8. Cardura 4 mg daily.  9. Protonix 40 mg daily.  10.Allopurinol 100 mg.  11.Dilantin 100 mg p.o. t.i.d.   DISCHARGE INSTRUCTIONS:  Discussed condition with patient extensively in  front of daughters and other family members.  Explained to the patient  that she would need to start some type of anti-seizure medication which  we will start her on which is Dilantin.  We will also have her follow up  with Dr. Merlene Laughter.  We will give her his number to call and set up the  appointment for next week.  She is to increase her activity slowly.  If  she has any other problems she is to return the emergency room.  Explained to them that some of the seizures could be caused to some of  her medications lowering her seizure threshold, however, after review of  her medications these are keeping patient's other diseases in better  control.  Also explained to them and reviewed the EEG and answered any  questions.  Thank you, spent greater than 30 minutes on this discharge.      Anselmo Pickler, DO  Electronically  Signed     CB/MEDQ  D:  10/05/2008  T:  10/05/2008  Job:  [id number]

## 2010-10-20 NOTE — Assessment & Plan Note (Signed)
[facility] OFFICE NOTE   NAME:[name], [name]                    MRN:          [id number]  DATE:11/27/2007                            DOB:          07/30/1926    CARDIOLOGIST:  Cristopher Estimable. Lattie Haw, MD, [facility]   PRIMARY CARE PHYSICIAN:  Leonides Grills, MD   REASON FOR VISIT:  Post ER visit for edema.   HISTORY OF PRESENT ILLNESS:  [name] is a 77 year old female  patient followed by Dr. Lattie Haw with history of diastolic congestive  heart failure as well as mitral valve disease and a history of CVA,  previously on Coumadin.  She had Coumadin discontinued when she  presented with a retinal  hemorrhage.  Her last echocardiogram was done  in 2007, and this revealed mild mitral valve thickening, severe annular  calcification, mild stenosis, and mild mitral regurgitation.  Her LV  function was normal at that time with borderline hypertrophy.  She  presented to the emergency room last night with complaints of lower  extremity edema.  This has been present for the last week or so.  She  denies any significant change in her shortness of breath.  She describes  NYHA Class IIB to III symptoms.  She denies orthopnea or PND.  She  denies any syncope.  She has had some atypical chest pains recently.  This seems to be associated with meals.  She does take Zantac from time-  to-time with relief.  Her left leg seems to be more swollen than her  right.  She had some blood work drawn by the ER physician and was asked  to follow up in our office today.  Of note, her blood work was  significant for a BNP of 225, her potassium was 4.2, BUN 28, and  creatinine 2.11.  Her creatinine was up from 1.67 on Oct 25, 2007.  Her  hemoglobin was 11.6, MCV 85.1.  Of note, her Lasix was decreased in May  secondary to some orthostasis.   CURRENT MEDICATIONS:  1. K-Dur 20 mEq daily.  2. Atenolol 50 mg daily.  3. Norvasc 5 mg  daily.  4. Lasix 40 mg daily.  5. Meclizine p.r.n.   PHYSICAL EXAMINATION:  GENERAL:  She is a well-nourished, well-developed  female.  VITAL SIGNS:  Blood pressure is 124/68, pulse 64, and weight 205 pounds,  this is down a pound since her last visit.  HEENT:  Normal.  NECK:  Without appreciable JVD.  CARDIAC:  Normal S1 and S2.  Regular rate and rhythm.  LUNGS:  Clear to auscultation bilaterally without rales.  ABDOMEN:  Soft and nontender.  EXTREMITIES:  Trace to 1+ edema with the left lower extremity clearly  being greater than the right.  No warmth or redness noted.   EKG from last night revealed sinus bradycardia with a heart rate of 57,  leftward axis, and nonspecific ST-T waves changes.   IMPRESSION:  1. Lower extremity edema, left greater than the right.  2. History of diastolic heart failure in the past.  3. Mild mitral stenosis and mild mitral regurgitation by  last      echocardiogram in 2007.  4. History of cerebrovascular accident.      a.     Off Coumadin secondary to history of retinal hemorrhage.  5. Hypertension.  6. Atypical chest pain.  7. Renal insufficiency.  8. Dyslipidemia.  9. Nonischemic Myoview in October 2006.   PLAN:  1. [name] presents for further evaluation of lower extremity      edema.  Her left leg looks to be more swollen than her right.  I      cannot appreciate any palpable cords.  However, we will set her up      for lower extremity venous ultrasound to rule out the possibility      of deep vein thrombosis.  2. She is not having any increased shortness of breath.  Her BNP is      mildly elevated.  I do not think she is experiencing acute on      chronic diastolic heart failure, but we will go ahead and increase      her Lasix to 60 mg a day.  We will need to keep a close eye on her      renal function secondary to her increased creatinine.  She will      take 60 mg a day through Wednesday of this week and we will check a       BMET on Wednesday.  3. We will add on the equivalent of a CMET and a TSH to the blood work      that was drawn in the lab yesterday to further assess her edema.  4. She has not had an echocardiogram in 2 years now.  We will set her      up for relook echocardiogram to reassess her LV function as well as      her mitral valve.  5. The patient will return for followup in the next 3-4 weeks.      Richardson Dopp, PA-C  Electronically Signed      Cristopher Estimable. Lattie Haw, MD, [facility]  Electronically Signed   SW/MedQ  DD: 11/27/2007  DT: 11/28/2007  Job #: [id number]   cc:   Leonides Grills, M.D.

## 2010-10-20 NOTE — Letter (Signed)
April 12, 2007    Leonides Grills, M.D.  [address], [place] [address]   RE:  [name], [name]  MRN:  [id number]  /  DOB:  31-Dec-1926   Dear Rush Landmark:   [name] was seen urgently today after referral from Dr. Caron Presume.  She presented to that office complaining of nocturnal wheezing. Her  history is somewhat diffuse. She has edema, but it is difficult to  determine if this new. She also may or may not have orthopnea. There has  been no recent change in weight, medication or diet. She has chronic  dyspnea on exertion. Again, it is difficult to tell if there has been  any exacerbation.   Her medications are unchanged and include warfarin as directed, KCL 20  mEq daily, furosemide 40 mg daily, atenolol 50 mg daily, amlodipine 5 mg  daily.   On examination, very pleasant woman in no acute distress. The weight is  206, one pound less than five months ago. Blood pressure 130/70, heart  rate 56 and regular, respirations 16.  NECK: No jugular venous distention; normal carotid upstrokes without  bruits.  LUNGS:  Clear.  CARDIAC: Normal 1st and 2nd heart sounds; 4th heart sound present.  ABDOMEN: Soft and nontender; no organomegaly.  EXTREMITIES: 1-2+ pitting edema to the mid tibia.   IMPRESSION:  [name] does not clearly have an exacerbation of  chronic diastolic congestive heart failure. She has mitral valve disease  related to mitral annular calcification,  which is not likely to progress rapidly. She has stage 3 chronic kidney  disease with a creatinine of 1.5 when last assessed in August. We will  go ahead and increase furosemide to 60 mg daily while monitoring renal  function. I had her lie flat in the office for 5-10 minutes. There was  no apparent orthopnea. Chest x-ray, metabolic profile, CBC and BNP  levels are pending.    Sincerely,      Cristopher Estimable. Lattie Haw, MD, [facility]  Electronically Signed    RMR/MedQ  DD: 04/12/2007  DT: 04/12/2007  Job #:  [id number]

## 2010-10-20 NOTE — Consult Note (Signed)
[name], [name]             ACCOUNT NO.:  [id number]   MEDICAL RECORD NO.:  [id number]          PATIENT TYPE:  INP   LOCATION:  A306                          FACILITY:  [facility]   PHYSICIAN:  Kofi A. Merlene Laughter, M.D. DATE OF BIRTH:  05-May-1927   DATE OF CONSULTATION:  DATE OF DISCHARGE:                                 CONSULTATION   REASON FOR CONSULTATION:  Recurrent spells of syncope and diaphoresis.  The patient is a 77 year old black female who developed the acute onset  of not feeling well, crampy abdominal pain.  She called her daughter who  is a Marine scientist at this hospital, went over to see the patient, unresponsive,  diaphoretic.  There were some support complaint of shortness of breath  and chest pain although patient has been worked up for cardiac and  pulmonary problems, this has been unrevealing.  No focal neurological  symptoms are reported.  The notes indicate that the patient had a  similar episode about 2 months ago although this time she was not eating  or using the bathroom.  The patient had another event while being  discharged this past Saturday.  This time, however, she was using the  potty prior to trying to defecate and again she had a similar event,  abdominal discomfort, diaphoresis, and unresponsive although she was  apparently not totally out.  Patient seemed to be able to provide a lot  of details about the initial event that led her to the hospital.  She  does, however, report not passing out completely on the 2nd event.  The  period that she had the episode 2 years ago which again may have been  some type or recurrent episodes.  During the episode while she was  hospitalized, pulse rate was in the 70s and 60s.  No significant drop in  blood pressure or blood sugar.   PAST MEDICAL HISTORY:  Significant for:  1. Gout.  2. Nephrolithiasis.  3. Diastolic congestive heart failure.  LV function 60%.  4. Hypertension.  5. Osteoarthritis.  6. Apparently a stroke  in the past.  7. Arrhythmia in the past.   ADMISSION MEDICATIONS:  1. Potassium chloride 20 mEq daily.  2. Lasix 40 mg daily.  3. Lisinopril 40 mg daily.  4. Aspirin 325 mg daily.  5. Catapres 0.1 mg b.i.d.  6. Zantac 150 mg b.i.d.  7. Zocor 40 mg daily.  8. Doxazosin 2 mg apparently just started recently and also was on      atenolol.  This was also discontinued recently.  9. Protonix 40 mg.  10.Allopurinol 100 mg.  11.Colchicine.   ALLERGIES:  NONE KNOWN.   SURGICAL HISTORY:  Unremarkable.   FAMILY HISTORY:  Significant for unknown cancer.   SOCIAL HISTORY:  Lives in [place] with her husband.  No alcohol,  tobacco, or illicit drug use.  She is in a wheelchair apparently because  of arthritis.   PHYSICAL EXAMINATION:  Shows an obese, pleasant lady.  She is currently  having some significant nausea and recently got Zofran and just also got  some Phenergan.  She is  complaining of abdominal discomfort.  Temperature 98.0.  Pulse 87.  Respirations 18.  Blood pressure 144/63.  HEENT:  Neck is supple.  Head is normocephalic, atraumatic.  ABDOMEN:  Obese and soft.  EXTREMITIES:  No significant edema.  MENTATION:  The patient is awake and alert.  She converses fairly well.  No dysarthrias observed.  No aphasia.  She is lucid, follows commands  well.  CRANIAL NERVES:  The left pupil is 5 mm and nonreactive.  There is a  dense cataract there.  Apparently, has poor vision there from an old  stroke.  The right is 3 mm and reactive.  Extraocular movements are  intact.  Face and muscle strength is symmetric.  Tongue midline.  Uvula  midline.  Shoulder shrug is normal.  MOTOR:  Shows normal tone, bulk, and strength in all of the upper  extremities.  The legs are weak proximally 4/5, distally 5/5.  Reflexes  are brisk throughout.  Plantars both equivocal.  Sensation normal to  light touch and temperature.  COORDINATION:  No past pointing, tremors, dysmetria, or Parkinson's.    LABORATORY EVALUATION:  TSH 0.8.  MRI of the brain shows severe  periventricular and deep white matter, chronic ischemic changes, nothing  acute.  Head CT scan shows nothing acute.  Carotid Dopplers negative.  EEG shows multiple left temporal sharp wave activity/epileptiform  activity.   IMPRESSION:  1. Recurrent spells of unresponsiveness, syncope associated with      nausea.  Nurse did relate to me that she did have some shaking of      both upper extremities during one event.  Given the abnormal EEG, I      think we are suspicious that these events could be epileptic,      especially given workup has been unrevealing for other etiologies.  2. Hypertension.   RECOMMENDATIONS:  We will go ahead and treat the patient with  antiepileptic medication.  We will start her on a loading dose of Keppra  500 and then start a maintenance dose of 250 mg.   Thanks for this consultation.      Kofi A. Merlene Laughter, M.D.  Electronically Signed     KAD/MEDQ  D:  10/07/2008  T:  10/07/2008  Job:  [id number]

## 2010-10-20 NOTE — Consult Note (Signed)
[name], [name]             ACCOUNT NO.:  [id number]   MEDICAL RECORD NO.:  [id number]          PATIENT TYPE:  INP   LOCATION:  A340                          FACILITY:  [facility]   PHYSICIAN:  Cristopher Estimable. Lattie Haw, MD, FACCDATE OF BIRTH:  [birth year]   DATE OF CONSULTATION:  11/05/2008  DATE OF DISCHARGE:                                 CONSULTATION   REFERRING PHYSICIAN:  Dr. Bonnielee Haff of [facility], Team P.   PRIMARY CARE PHYSICIAN:  Dr. Orson Ape.   CARDIOLOGIST:  Dr. Jacqulyn Ducking.   REASON FOR CONSULTATION:  Atrial fibrillation.   HISTORY OF PRESENT ILLNESS:  [name] is a 77 year old female with  a history of diastolic heart failure and mild mitral stenosis in the  setting of rheumatic heart disease who was just discharged from [facility] in early May 2010.  We saw her in consultation for  syncope.  It was theorized that she had vasovagal episodes related to  bowel movements and was evaluated by gastroenterology.  She had an  abnormal EEG and was felt to have underlying seizures and was placed on  Keppra by Neurology.  She had an MRI that demonstrated severe chronic  small vessel ischemic changes.  Of note, she had been taken off of  atenolol by Dr. Lattie Haw in April of 2010 secondary to bradycardia.  According to her family, she has had several seizures since discharge  from the hospital.  She has had symptoms that are consistent with  absence-type seizures.  She has also had some tonic-clonic-type  movements as well.  She had a syncopal episode on Nov 03, 2008, that was  witnessed by her family.  She was apparently brought to the bathroom in  case she needed to have a bowel movement.  The patient has no  recollection of any of this.  She was somewhat confused when EMS  arrived.  The patient's daughter is a Marine scientist and she had difficulty  feeling her pulse is, as well as getting her blood pressure.  Her  breaths were somewhat shallow but CPR never  had to be initiated.  Apparently, the symptoms lasted about 45 minutes.  When she finally  arrived to the emergency room at [facility], she was noted be  in atrial fibrillation with rapid ventricular rate.  She was treated  with IV diltiazem.  She was noted to have acute on chronic renal failure  with a creatinine of 3.08.  Several of her medications were held  including her ACE inhibitor and her Lasix.  She was placed on IV  hydration.  Since admission, her creatinine has improved down to 1.65.  The notes indicate that she had another seizure in the early morning  hours on Nov 04, 2008.  Telemetry reveals that she had a greater than 6-  second episode of asystole.  Around that time, her diltiazem was  discontinued.  She has had a couple of episodes of paroxysmal atrial  fibrillation since that time with rates going up into the 140s.  We are  now asked to further evaluate.   PAST MEDICAL  HISTORY:  1. Rheumatic heart disease with mild mitral stenosis in the past.      a.     Echocardiogram April 2010 demonstrating mild to moderate       LVH, EF 85%, mild mitral stenosis, biatrial enlargement.  2. Chronic diastolic heart failure.  3. Seizure disorder diagnosed May 2010.  4. History of CVA.      a.     Coumadin discontinued secondary to retinal hemorrhage in the       past.  5. Hypertension.  6. Chronic kidney disease with baseline creatinine of about 1.3 to      1.5.  7. Hyperlipidemia.  8. Sinus bradycardia.  9. Gout.  10.Nephrolithiasis.  11.Osteoarthritis.  12.Vertigo.   MEDICATIONS AT HOME:  1. Potassium 20 mEq daily.  2. Lasix 40 mg daily.  3. Lisinopril 40 mg daily.  4. Aspirin 325 mg daily.  5. Zantac 150 mg b.i.d.  6. Zocor 40 mg daily.  7. Doxazosin 2 mg daily.  8. Keppra 250 mg b.i.d.  9. Allopurinol 100 mg daily.  10.Colchicine 0.6 mg daily.  11.Meclizine 25 mg p.r.n.  12.Reglan p.r.n.   ALLERGIES:  NO KNOWN DRUG ALLERGIES.   SOCIAL HISTORY:  The  patient lives in [place] with her husband.  She denies tobacco or alcohol abuse.   FAMILY HISTORY:  Significant for CAD with her sister having myocardial  infarction in her 69s.   REVIEW OF SYSTEMS:  Please see HPI.  Denies any fever, dysuria,  hematuria, bright red blood per rectum, or melena.  She does note cold  intolerance.  She denies orthopnea, PND, or pedal edema.  Denies cough.  All other systems reviewed and negative.   PHYSICAL EXAM:  She is a well-nourished, well-developed female in no  acute distress.  Blood pressure is 136/64.  Pulse 83.  Respirations 20.  Temperature 97.7.  Oxygen saturation 100% on 2 L.  Ins and outs  1970/2000.  HEENT:  Normal.  NECK:  Without JVD.  LYMPH:  Without lymphadenopathy.  ENDOCRINE:  Without thyromegaly.  CARDIAC:  Normal S1 and S2.  Regular rate and rhythm without murmur.  LUNGS:  Clear to auscultation bilaterally without rales.  SKIN:  Warm and dry.  ABDOMEN:  Soft and nontender with normoactive bowel sounds.  No  organomegaly.  EXTREMITIES:  Without clubbing, cyanosis, or edema.  MUSCULOSKELETAL:  Without joint deformity.  NEUROLOGIC:  She is alert and oriented x3.  Cranial nerves II-XII are  grossly intact.  VASCULAR:  No carotid bruits noted bilaterally.   Chest x-ray, no acute process.  Head CT, pronounced chronic small vessel  ischemic changes.  No acute changes.  EKG is atrial fibrillation with a  heart rate of 101, normal axis, no ischemic changes.  EKG #2, normal  sinus rhythm with a heart rate of 80, nonspecific ST-T wave changes.   LABS:  Hemoglobin 10.4, MCV 90.3, potassium 4, creatinine 1.65, albumin  2.8, CK-MB 6.8, 6.9, 6.5.  Troponin-I 0.25, 0.17, 0.14.  Magnesium 2.   ASSESSMENT:  1. Recurrent syncope.  2. Paroxysmal atrial fibrillation with tachybrady syndrome.  3. Newly diagnosed seizure disorder.  4. History of stroke with Coumadin being discontinued in the past      secondary to retinal hemorrhage.   5. Chronic diastolic congestive heart failure.  6. Hypertension.  7. Rheumatic heart disease with mild mitral stenosis.  8. Acute on chronic renal failure which is currently improved.  9. Elevated troponin which is likely secondary to  acute on chronic      renal failure.   RECOMMENDATIONS:  The patient was also interviewed and examined by Dr.  Lattie Haw.  Prior records have been reviewed.  The patient presents with  paroxysmal atrial fibrillation with brady-tachy syndrome.  It is unclear  if her loss of consciousness is secondary to A-Fib with RVR or pauses.  Most likely it is the latter.  She cannot be treated with any AV nodal  blocking agent until she has a pacemaker in place.  At this point, we  recommend that if she has symptomatic atrial fibrillation that persists  she will need emergent temporary pacing and transfer to [facility].  Her seizures are likely secondary to cerebral hypoperfusion.  Her Keppra will be discontinued.  Her timolol eyedrops will currently be  discontinued until she has a pacemaker in place.  She will be placed on  bedrest tonight and we will arrange for elective transfer to [facility] in the morning.  Thank you very much for the consultation.  As  noted above, the patient will be transferred to [facility] on November 06, 2008, for elective pacemaker implantation.      Richardson Dopp, PA-C      Cristopher Estimable. Lattie Haw, MD, [facility]  Electronically Signed    SW/MEDQ  D:  11/06/2008  T:  11/06/2008  Job:  [id number]   cc:   Bonnielee Haff, MD   Leonides Grills, M.D.  Fax: [phone]

## 2010-10-20 NOTE — Discharge Summary (Signed)
[name], SIGGERS NO.:  [id number]   MEDICAL RECORD NO.:  [id number]          PATIENT TYPE:  INP   LOCATION:  3709                         FACILITY:  [facility]   PHYSICIAN:  Thompson Grayer, MD       DATE OF BIRTH:  [birth year]   DATE OF ADMISSION:  11/06/2008  DATE OF DISCHARGE:  11/07/2008                               DISCHARGE SUMMARY   This patient has no known drug allergies.   PRIMARY CAREGIVER:  Leonides Grills, MD.   CARDIOLOGIST:  Cristopher Estimable. Lattie Haw, MD, [facility], [place] in  [place].   TIME FOR THIS DICTATION:  Greater than 40 minutes.   FINAL DIAGNOSES:  1. Discharging day 1, status post implant of a [employer]. Jude ACCENT RF DR      dual-chamber pacemaker.  2. Tachybrady syndrome.      a.     Asystole pause on telemetry greater than 6 seconds.      b.     Symptomatic bradycardia.   SECONDARY DIAGNOSES:  1. History of recurrent syncope.      a.     Always seems to be coupled with defecation with suspicion of       vasovagal events.  2. Seizure disorder, abnormal electroencephalogram at prior admission.  3. Paroxysmal atrial fibrillation.  Coumadin contraindicated secondary      to history of retinal hemorrhage.  4. History of cerebrovascular accident.  5. Diastolic dysfunction.  6. Echocardiogram, October 04, 2008, ejection fraction 85%.  7. Elevated creatinine on admission, Nov 03, 2008, in the setting of      dehydration and medications consisting of Lasix, potassium, and      lisinopril.  Creatinine has normalized to 1.26 on the day of      discharge.  8. Dyslipidemia.  9. Gout.  10.Hypertension.  11.History of rheumatic heart disease (mild mitral stenosis).  12.Chronic renal insufficiency.   PROCEDURE:  Implant of the St. Jude ACCENT RF DR dual-chamber pacemaker  by Dr. Thompson Grayer.  The patient has no postprocedural hematoma.  There  is mild stretching of the right atrial lead, but the values at  interrogation are within normal  limits.  Chest x-ray shows no  pneumothorax but shows bilateral basilar atelectasis.   PLAN:  1. Add diltiazem 180 mg daily to offset atrial fibrillation and rapid      ventricular rate, which brought the patient to emergency room on      Nov 03, 2008.  2. Discontinue Keppra per Dr. Lattie Haw.  3. ACE inhibitor, Lasix, and potassium will be held until the patient      has been seen by Dr. Lattie Haw.  4. Atenolol still remains on hold.  This was discontinued by Dr.      Lattie Haw in April 2010.   BRIEF HISTORY:  [name] is a 77 year old female.  She has a  history of seizure disorder and syncope.  This is a combination that can  cause confusion in symptoms and treatment.   The patient had a prior admission on October 04, 2008, with a syncope.  Her prodrome was  nausea and abdominal cramping and she was on the  commode at that time.  The patient was found to have an abnormal EEG on  October 04, 2008, and started on Dilantin.  During the hospitalization,  the patient had continued seizure-like activity always centered on bowel  movement activity.  Her Dilantin was changed to Keppra.   Sunday, Nov 03, 2008, the patient relates that she felt her heart racing  in the morning, continued about 3 hours.  Apparently, she was still  having palpitations (new to her) when she passed out in the bathroom.  Her daughter who is a Marine scientist attended.  She had difficulty finding her  pulse.  On arrival in the emergency room, the patient was in atrial  fibrillation with rapid ventricular rate.  Also, her creatinine was  3.08.  Her ACE inhibitor and Lasix were held.  Atenolol had been held  prior back in April for bradycardia.  The patient has been hydrated at  [facility] with improved renal function.  Her last creatinine on November 06, 2008, is 1.3.  The patient had telemetry at [facility].  It recorded a  greater than 6-second period of asystole.  Of note, the patient's  echocardiogram on October 04, 2008,  ejection fraction 85%.  No left  ventricular wall motion abnormalities, mild mitral stenosis, mild  tricuspid regurgitation.   HOSPITAL COURSE:  The patient accepted in transfer from [facility] on November 06, 2008, with a background of atrial fibrillation,  rapid ventricular rate, tachybrady syndrome, asystole on telemetry, and  recurrent syncope.   The patient was seen by Dr. Rayann Heman, who agreed with the assessment that  the patient would require a pacemaker.  This was implanted on November 06, 2008.  There had been no complications.  The patient discharged on  postprocedure day #1.  The patient is asked to keep her arm carefully  quiet for the next 4 days.  She is to keep her incision dry for the next  7 days, to sponge bathe until Wednesday, November 13, 2008.  Once again,  chest x-ray has been examined by Dr. Rayann Heman, and the patient will be  discharged on November 07, 2008.   Medications are as follows:  1. Lasix 40 mg daily.  She is to hold this for now until she sees Dr.      Lattie Haw.  2. Potassium chloride 20 mEq daily.  She is to hold this for now until      she sees Dr. Lattie Haw.  3. Lisinopril 40 mg daily.  She is to hold this until she sees Dr.      Lattie Haw.  4. Enteric-coated aspirin 325 mg daily to continue.  5. Zantac 150 mg twice daily.  6. Zocor 40 mg daily at bedtime.  7. Doxazosin 2 mg daily.  8. Allopurinol 100 mg daily.  9. Colchicine 0.6 mg daily.  10.Meclizine 25 mg as needed.  11.Reglan 10 mg as needed, a new medication.  12.DILTIAZEM 180 mg daily (to control atrial fibrillation rate).  13.The patient is asked to stop Keppra.   FOLLOWUP:  1. [facility], [facility], [address] on      Monday November 25, 2008, at 4:30.  This is a Proofreader.  2. To see Dr. Lattie Haw at [facility], [facility] on      Thursday, November 28, 2008, at 1:30.  3. To see Dr. Rayann Heman at the [facility], [facility] on      Friday,  February 21, 2009, at 9 o'clock.   LABORATORY STUDIES THIS ADMISSION:  On the day of discharge, November 07, 2008, hemoglobin 10.2, hematocrit 29.9, platelets are 139, white cells  4.2.  Sodium 144, potassium 3.9, chloride 118, carbonate is 20, BUN is  12, creatinine 1.26, glucose 80.  BNP on Nov 03, 2008, on admission was  256.  Once again, creatinine on admission on Nov 03, 2008, 3.08.  Protime this admission 15.0, INR 1.1.      [name], [place]      Thompson Grayer, MD  Electronically Signed    GM/MEDQ  D:  11/07/2008  T:  11/07/2008  Job:  [id number]   cc:   Leonides Grills, M.D.  Cristopher Estimable. Lattie Haw, MD, [facility]

## 2010-10-20 NOTE — Assessment & Plan Note (Signed)
[facility] OFFICE NOTE   NAME:[name]                    MRN:          [id number]  DATE:09/16/2008                            DOB:          01-Sep-1926    CARDIOLOGIST:  Dr. Lattie Haw.   PRIMARY CARE PHYSICIAN:  Dr. Orson Ape.   REASON FOR VISIT:  Neck discomfort.   HISTORY OF PRESENT ILLNESS:  [name] is a 77 year old female  patient with a history of mitral valve disease and diastolic congestive  heart failure, as well as prior history of CVA who presented as a walk  in to the clinic today with complaints of discomfort on the right side  of her neck.  This has been ongoing for the last 1 to 2 weeks.  She  notes it especially with rotating her head from left to right.  She  denies any dizziness.  She denies any weakness in her face, difficulty  with speech, changes in her vision, or weakness in her extremities.  She  denies any headache.  She denies syncope or near syncope.  From a  cardiac perspective, she does note some chest discomfort.  She has had  about 2 or 3 episodes last week of left-sided sharp discomfort that  lasts for just a few minutes.  It usually occurs at rest.  She has no  associated symptoms.  There are no aggravating or alleviating factors.  She does have a history of chronic shortness of breath with exertion.  She describes NYHA class IIB to III symptoms.  She denies any orthopnea  or paroxysmal nocturnal dyspnea.  She denies any lower extremity edema.   CURRENT MEDICATIONS:  1. K-Dur 20 mEq daily.  2. Atenolol 25 mg daily.  3. Lasix 40 mg daily.  4. Lisinopril 40 mg daily.  5. Aspirin 325 mg daily.  6. Catapres 0.1 mg b.i.d.  7. Zantac 150 mg b.i.d.  8. Zocor 40 mg daily.  9. Meclizine p.r.n.  10.Metoclopramide p.r.n.   ALLERGIES:  NO KNOWN DRUG ALLERGIES.   SOCIAL HISTORY:  She is a nonsmoker.   REVIEW OF SYSTEMS:  Please see the HPI.  She denies any  significant  changes in her dyspepsia.  She denies dysphagia or belching.   PHYSICAL EXAM:  She is a well-nourished, well-developed female in no  acute distress.  Blood pressure is 138/60.  Pulse 60.  Weight 191  pounds.  HEENT:  Normal.  NECK:  Without JVD.  LYMPH:  Without lymphadenopathy.  CARDIAC:  Normal S1 and S2.  Regular rate and rhythm.  No significant  murmur.  LUNGS:  Clear to auscultation bilaterally.  No wheezing.  No rales.  ABDOMEN:  Soft, nontender.  EXTREMITIES:  Without edema.  NEUROLOGIC:  She is alert and oriented x3.  Cranial nerves II-XII  grossly intact.  Biceps and brachial radialis, deep tendon reflexes are  2+ bilaterally.  Fine motor skills are intact.  Strength is 4+ to 5-/5  and equal bilaterally.  MUSCULOSKELETAL:  She has a moderate area of swelling with almost cystic-  like structure over her right trapezius muscle that is nontender  and  fluctuant.  There is no redness or warmth.   Electrocardiogram reveals sinus bradycardia with a heart rate of 43,  leftward axis, poor R-wave progression, no ischemic changes.   ASSESSMENT AND PLAN:  1. Neck discomfort.  She has some pulling or squeezing sensation in      her right posterior neck that is worsened by positioning changes.      She has some tingling sensation that radiates up into her scalp.      However, she denies numbness.  She denies any symptoms consistent      with transient ischemic attacks.  Given the edema noted over her      trapezius on the right, I feel that she has a musculoskeletal      etiology to her symptoms.  I have recommended heat to her neck 2 to      3 times a day, as well as ibuprofen in a limited amount.  I have      suggested ibuprofen 200 mg 3 times a day for 3 days and then to use      p.r.n. after that.  I have also recommend that she follow up with      Dr. Orson Ape later this week for further evaluation and      recommendations.  2. Chest discomfort.  She has no known  history of coronary disease.      She had a Myoview study in 2006 that demonstrated no ischemia or      infarction.  She has preserved left ventricular function when last      assessed by echocardiography in June of 2009.  She is not having      any exertional chest discomfort.  She does have a significant      history of acid reflux disease.  I do not think further      cardiovascular testing is warranted at this time.  At this point, I      have recommend discontinuing her Zantac and starting on Protonix 40      mg a day.  She will be seen back in close followup to reassess her      symptoms and make further recommendations, if any.  3. Hypertension.  This seems to be well controlled on her current      medical regimen.  4. Sinus bradycardia.  She is asymptomatic.  Her atenolol dosage has      been decreased over the last several months.  We may need to      eventually discontinue this or decrease the dose even more.  No      medication changes will be made at this point in time.  5. Carotid stenosis.  She had carotid Dopplers done in June of 2006      that demonstrated 0% to 40% bilateral internal carotid artery      stenosis.  Given her symptoms outlined above in #1, we will go      ahead and set her up for followup carotid Dopplers to reassess her      carotid stenosis.  6. Mitral valve disease.  She has previously had mild-to-moderate      mitral stenosis documented by echocardiography and transesophageal      echocardiography.  Her last echocardiogram in June 2009      demonstrated moderate to marked mitral annular calcification with      trivial MR.  No further workup is indicated at this time.  7. Chronic  kidney disease.  Her last creatinine was assessed in      December and was stable at 1.48.  8. Chronic diastolic congestive heart failure.  She is optivolemic on      exam today.  Her functional class is New York Heart Association      class IIB to III and this is stable.  She  will continue her current      medications without change.   DISPOSITION:  The patient will be brought back in followup with Dr.  Lattie Haw in the next 2 weeks or sooner p.r.n.  She has been advised to  follow up with her primary care physician this week.  She has been  advised to go to the emergency room if she develops any symptoms that  are consistent with transient ischemic attack or CVA.      Richardson Dopp, PA-C  Electronically Signed      Marijo Conception. Verl Blalock, MD, [facility]  Electronically Signed   SW/MedQ  DD: 09/16/2008  DT: 09/16/2008  Job #: [id number]   cc:   Leonides Grills, M.D.

## 2010-10-20 NOTE — Consult Note (Signed)
[name], TIBBETTS             ACCOUNT NO.:  [id number]   MEDICAL RECORD NO.:  [id number]          PATIENT TYPE:  INP   LOCATION:  A340                          FACILITY:  [facility]   PHYSICIAN:  Kofi A. Merlene Laughter, M.D. DATE OF BIRTH:  [birth year]   DATE OF CONSULTATION:  DATE OF DISCHARGE:                                 CONSULTATION   The patient is a 77 year old black female who is known to our service  for recurrent spells of altered mentation sometimes associated with  shaking.  The patient has had abnormal EEG.  She was placed on Keppra  and appears has done well at least in a short term, but it appears that  over the past couple of weeks, she has had 3 of these spells usually  associated with going to the bathroom although not always.  The patient  had a spell while going to the bathroom where she became diaphoretic and  unresponsive.  Her eyes were open and she was moving her upper  extremities and shaking.  The patient has no recollection of the event.  No focal numbness, weakness are reported.  No headaches or chest pain.  The patient was on low-dose Keppra 250 b.i.d.   Labs on admission shows elevated BUN and creatinine.  Husband reported  she had not been eating well, especially with these spells.   MEDICAL HISTORY:  1. 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  2. Atrial fibrillation.  3. Hypertension.  4. Dyslipidemia.  5. Diastolic dysfunction.   ADMISSION MEDICATIONS:  1. Keppra 250 mg b.i.d.  2. Potassium chloride 20 mEq daily.  3. Lasix 40 mg daily.  4. Lisinopril 40 mg daily.  5. Aspirin 325 mg daily.  6. Zantac 150 mg b.i.d.  7. Zocor 40 mg daily.  8. Doxazosin 2 mg.  9. Allopurinol 100 mg daily.  10.Colchicine.  11.Meclizine.  12.Reglan p.r.n.   ALLERGIES:  None known.   REVIEW OF SYSTEMS:  As stated in history of present illness, otherwise  unrevealing.   SOCIAL HISTORY:  The patient has been accompanied currently with her  husband.  No alcohol, tobacco, or illicit drug use.  She is seated in  wheelchair, sometimes a cane or walker.   PHYSICAL EXAMINATION:  GENERAL:  An obese, pleasant lady in no acute  distress.  VITAL SIGNS:  Blood pressure 120/50, heart rate between 97 and 100,  respirations 20.  HEENT:  Head is normocephalic, atraumatic.  NECK:  Supple.  ABDOMEN:  Obese, soft.  EXTREMITIES:  No significant edema.  MENTATION:  The patient is awake and alert.  She converses fairly well.  She is oriented to person, place, month, year.  She is lucid, coherent.  Cranial nerve evaluation, pupil on the left is fixed and dilated from an  old injury, 8 mm nonreactive.  The right is 4 mm and reactive.  Extraocular movements are full.  The patient does have mild ptosis on  the right, otherwise muscle strength of the facial muscles are normal.  Tongue is midline.  Uvula midline.  Shoulder shrug is normal.  Motor  examination shows normal tone, bulk, and strength.  There is no pronator  drift.  Coordination shows no dysmetria.  There is no past-pointing.  No  parkinsonism.  Reflexes are preserved.  Plantar reflex of both are  downgoing.   SUPPORTIVE DATA:  Head CT scan of the brain shows pronounced  periventricular and deep white matter ischemic changes.  CPK 6 gets  running 1.25, calcium 9.1, beta-type natriuretic peptide elevated at  256.  Urinalysis negative, BUN 55, creatinine 3, previously normal at 16  and 1.3 respectively.  Sodium 137, potassium 4.6, chloride 108, CO2 22,  glucose 107, calcium 9.8.  WBC 3.5, hemoglobin 11, platelet count of  158.   ASSESSMENT AND PLAN:  1. Breakthrough seizure/poorly-controlled seizures.  2. Acute renal failure.  3. Atrial fibrillation with rapid ventricular response.  4. Ischemic white matter disease from hypertension.  5. Elevated troponin I.   RECOMMENDATIONS:  Keppra has been increased to a total of 750 daily.  She may have to go up to 1000 mg, especially once  her kidney function  improves.  We will continue to monitor the patient.      Kofi A. Merlene Laughter, M.D.  Electronically Signed     KAD/MEDQ  D:  11/05/2008  T:  11/05/2008  Job:  [id number]

## 2010-10-20 NOTE — Group Therapy Note (Signed)
NAMEBISMA, HAINS             ACCOUNT NO.:  [id number]   MEDICAL RECORD NO.:  [id number]          PATIENT TYPE:  INP   LOCATION:  A306                          FACILITY:  [facility]   PHYSICIAN:  Anselmo Pickler, DO    DATE OF BIRTH:  10/31/1926   DATE OF PROCEDURE:  10/07/2008  DATE OF DISCHARGE:                                 PROGRESS NOTE   The patient seen today.  She had had an episode while trying to have a  bowel movement where she felt like she was going to black out and is  complaining of stomach fullness and she also vomited today, which was  the food that was from her dinner that she ate late last night around 10  o'clock.  Discussed case with Dr. Merlene Laughter.  He feels that a lot of this  could be due to seizures and he started her on IV Keppra.   VITALS:  For today, temperature 97.8, pulse of 100, respirations 20,  blood pressure 151/85.  GENERAL:  She is well-developed, well-nourished,  in no acute distress.  HEART:  Regular rate and rhythm.  LUNGS:  Clear to auscultation bilaterally.  ABDOMEN:  Soft, nontender, extended.  EXTREMITIES:  Positive pulses.   White count 6.1, hemoglobin 10.9, hematocrit 32.3, platelet count 218,  glucose is 105.  Sodium is 141, potassium 4.1, chloride 113, CO2 is 25,  glucose 125, BUN 21, creatinine 1.20.   ASSESSMENT/PLAN:  Syncopal episodes that occur with Valsalva.  The  patient has been started on seizure medication.  However, everything  regarding her heart has come back within normal limits.  Cardiology is  still following her.   PLAN:  1. Will be to start the intravenous Keppra.  2. Also to get Gastroenterology to see her since she has had nausea,      vomiting with each of these episodes to see if they have anything      more to add.  3. I anticipate discharging Ms. Cislo in next 1-2 days.      Anselmo Pickler, DO  Electronically Signed     CB/MEDQ  D:  10/07/2008  T:  10/07/2008  Job:  [id number]

## 2010-10-20 NOTE — Procedures (Signed)
[name], [name]             ACCOUNT NO.:  [id number]   MEDICAL RECORD NO.:  [id number]          PATIENT TYPE:  INP   LOCATION:  A306                          FACILITY:  [facility]   PHYSICIAN:  Kofi A. Merlene Laughter, M.D. DATE OF BIRTH:  1926-08-19   DATE OF PROCEDURE:  DATE OF DISCHARGE:                              EEG INTERPRETATION   REFERRING PHYSICIAN:  [facility].   REASON FOR THE STUDY:  This is a 77 year old lady who presents with a  single episode associated with diaphoresis.  The study is being done to  evaluate for possible seizures.   MEDICATIONS:  1. Lasix.  2. Lisinopril.  3. Aspirin.  4. Catapres.  5. Zantac.  6. Zocor.  7. Doxazosin.  8. Protonix.  9. Allopurinol.   ANALYSIS:  This is a 16-channel recording that is conducted for  approximately 20 minutes.  There is a well-formed posterior dominant  rhythm of 9 Hz which attenuates with eye-opening.  There is beta  activity observed in the frontal areas.  Awake and sleep activities are  observed.  There are K complexes and sleep spindles observed, indicating  stage 2 sleep.  Photic stimulation is carried out without significant  changes in the background activity.  There is no focal or lateralized  slowing; however, the patient did have several sharp wave activity which  phase reversed at T3 and associated with a field.  No evidence of  ongoing electrographic seizures.   IMPRESSION:  This is an abnormal recording showing multiple episodes of  left temporal epileptiform discharges, although there is no evidence of  ongoing electrographic seizures.  Given the history, consider anti-  seizure medications.      Kofi A. Merlene Laughter, M.D.  Electronically Signed     KAD/MEDQ  D:  10/04/2008  T:  10/04/2008  Job:  [id number]

## 2010-10-20 NOTE — Letter (Signed)
January 01, 2009    Meghan Grills, MD  [address],  [id number]   RE:  Meghan, White  MRN:  [id number]  /  DOB:  1926/10/04   Dear [name],   [name] returns to the office for continued assessment and  treatment of sick sinus syndrome, hypertension, and multiple additional  cardiovascular and medical issues.  Since her last visit, she has done  quite well.  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  She denies  any chest discomfort.   CURRENT MEDICATIONS:  1. Aspirin 325 mg daily.  2. Ranitidine 150 mg b.i.d.  3. Simvastatin 40 mg daily.  4. Allopurinol 100 mg daily.  5. Colchicine 0.6 mg daily.  6. Diltiazem 180 mg daily.  7. Doxazosin 2 mg daily.  8. She also uses meclizine and Senokot p.r.n.   PHYSICAL EXAMINATION:  GENERAL:  Very pleasant woman seated in a  wheelchair in no acute distress.  VITAL SIGNS:  The weight is 188, 2 pounds more than in June.  Blood  pressure 145/75.  She brings a list of 10 blood pressures at home, all  with systolics below [id number].  Heart rate is 80 and regular, respirations 12  and unlabored.  NECK:  No jugular venous distention; no carotid bruits.  THORAX:  Pacemaker in place in the left infraclavicular region; incision  is well-healed; no swelling, erythema, or tenderness.  LUNGS:  Clear.  CARDIAC:  Normal first and second heart sounds.  Modest basilar systolic  ejection murmur.  ABDOMEN:  Soft and nontender; no organomegaly.  EXTREMITIES:  Trace edema.  NEUROLOGIC:  Antalgic gait with a cane; she is fairly steady without any  apparent danger of falling.   IMPRESSION:  [name] is doing generally well.  Hypertension is  well-controlled.  Her pacemaker is apparently preventing additional  episodes of syncope.  She has dyslipidemia, but no significant  atherosclerotic disease.  Simvastatin 40 mg daily is adequate treatment  in this setting.   She has had no recurrent gout.   I am concerned about paroxysmal atrial fibrillation and possible need  for warfarin.  Counter-balancing that is her significant anemia when in  the hospital.  [name] shows hemoglobin low at 11.3, but hematocrit  normal at 36.7.  MCV is normal.  She also has had a history of renal  insufficiency, but a recent creatinine was 1.26, just slightly elevated.  She will continue her current medications for now and return in 1 month  for reassessment of her pacemaker by Dr. Lovena Le.  If she has had  significant atrial fibrillation, anticoagulation will be considered at  that time.  Otherwise, [name] in 4 months.    Sincerely,      [name]. Meghan Haw, MD, [facility]  Electronically Signed    RMR/MedQ  DD: 01/01/2009  DT: 01/02/2009  Job #: [id number]

## 2010-10-20 NOTE — Consult Note (Signed)
NAMEGEANA, [name]             ACCOUNT NO.:  [id number]   MEDICAL RECORD NO.:  [id number]          PATIENT TYPE:  INP   LOCATION:  A306                          FACILITY:  [facility]   PHYSICIAN:  Cristopher Estimable. Lattie Haw, MD, FACCDATE OF BIRTH:  [birth year]   DATE OF CONSULTATION:  10/04/2008  DATE OF DISCHARGE:                                 CONSULTATION   PRIMARY CARE PHYSICIAN:  Dr. Orson Ape.   CARDIOLOGIST:  Dr. Jacqulyn Ducking.   REASON FOR CONSULTATION:  Syncope.   HISTORY OF PRESENT ILLNESS:  [name] is a 77 year old female  patient with a history of mitral valve disease secondary to rheumatic  heart disease and diastolic heart failure, previously on Coumadin in the  setting of stroke that was discontinued after suffering a retinal  hemorrhage.  She presented to [facility] yesterday after  suffering a syncopal episode.  She developed dysphagia and eventual  nausea shortly after eating a piece of chocolate candy around 5:00 p.m.  yesterday.  She became lightheaded and tried to call her daughter.  She  does not recall punching in the numbers on the key pad.  She was able to  get into a wheelchair which she has been using recently secondary to a  gout flare in her right foot.  She tried to move into another room but  passed out while sitting in her wheelchair.  She is not sure how long  she had lost consciousness.  When she awoke, her daughter was there  trying to awaken her.  Her daughter is a Marine scientist and checked her blood  pressure and it had apparently dropped into the 90 systolically.  She  was somewhat confused and eventually brought into the emergency room for  further evaluation.  She does note some increased dyspnea with exertion  recently secondary to decreased mobility from her gout.  She has had a  long history of atypical chest pain.  She continues to have the same  symptoms without significant change.  She recently saw Dr. Lattie Haw who  discontinued her beta  blocker secondary to bradycardia.  This was  earlier this week.  Since admission to the hospital, she has had sinus  bradycardia with heart rates in the 40s on telemetry.  Her EKG initially  did demonstrate sinus rhythm of 62, normal axis, no acute changes.  Head  CT is without acute abnormality.  She does have atrophy and chronic  small vessel disease.  Her chest x-ray demonstrates cardiomegaly, no  active disease.  Her D-dimer is elevated to 0.75.  She is currently  awaiting results of her V/Q scan which was done earlier this morning.  Her BNP is mildly elevated to 54.  Cardiac markers are negative x2.  We  are asked to further evaluate in the setting of her syncope.   PAST MEDICAL HISTORY:  1. Diastolic congestive heart failure.  2. History of mild mitral stenosis secondary to rheumatic heart      disease.      a.     Echocardiogram June 2009 demonstrated a normal ejection  fraction, moderate to marked mitral annular calcification, trivial       mitral regurgitation, moderate left atrial enlargement, and       estimated peak RV systolic pressure mildly increased.  3. History of CVA, previously on Coumadin.      a.     Coumadin discontinued in the setting of retinal hemorrhage.      b.     Carotid Dopplers April 2010 demonstrating less than 50%       bilateral ICA stenosis.      c.     Myoview study October 2006 demonstrating no ischemia.  4. Hypertension.  5. Chronic kidney disease.  6. Hyperlipidemia.  7. Sinus bradycardia.  8. Gout.  9. Nephrolithiasis.  10.Osteoarthritis.  11.History of vertigo.   MEDICATIONS:  Potassium 20 mEq daily, Lasix 40 mg daily, lisinopril 40  mg daily, aspirin 325 mg daily, Catapres 0.1 mg b.i.d., Zantac 150 mg  b.i.d., Zocor 40 mg q.h.s., doxazosin 2 mg daily, Protonix 40 mg daily,  allopurinol 100 mg daily.   ALLERGIES:  NO KNOWN DRUG ALLERGIES.   SOCIAL HISTORY:  She lives in [place] with her husband.  She  denies tobacco or  alcohol abuse.   FAMILY HISTORY:  Significant for CAD.  Her sister had a myocardial  infarction in her 60s and is deceased.   REVIEW OF SYSTEMS:  Please see HPI.  Denies fevers, headaches, dysuria,  hematuria, bright red blood per rectum or melena, orthopnea, PND, pedal  edema, palpitations.  She has had a nonproductive cough.  All other  systems reviewed and negative.   PHYSICAL EXAMINATION:  She is a well-nourished, well-developed female in  no acute distress.  Blood pressure is 158/57, pulse 59, respirations 18, temperature 97.9,  oxygen saturation 98% on 2 liters.  HEENT:  Normal.  NECK:  Without JVD.  LYMPH:  Without lymphadenopathy.  ENDOCRINE:  Without thyromegaly.  CARDIAC:  Normal S1-S2, regular rate and rhythm.  No murmur.  LUNGS:  Clear to auscultation bilaterally.  SKIN:  Warm and dry.  ABDOMEN:  Soft, nontender with normoactive bowel sounds and no  organomegaly.  EXTREMITIES:  Without clubbing, cyanosis or edema.  MUSCULOSKELETAL:  Without joint deformity.  NEUROLOGIC:  She is alert and oriented x3.  Cranial nerves II-XII are  grossly intact.  VASCULAR:  Without carotid bruits bilaterally.   Chest x-ray and head CT as outlined above.  EKG as outlined above.   LABORATORY DATA:  Hemoglobin 12.4, potassium 4.4, creatinine 1.75,  glucose 133, D-dimer 2.75, BNP 254.  Cardiac enzymes negative x2.  Uric  acid 8.4.   ASSESSMENT:  1. Syncope.  2. Chronic diastolic congestive heart failure.  3. Good left ventricular function.  4. Mild mitral valve disease in the setting of rheumatic heart      disease.  5. Nonischemic Myoview study October 2006.  6. History of stroke, previously on Coumadin, now discontinued in the      setting of retinal hemorrhage.  7. Sinus bradycardia.  8. Hypertension.  9. Chronic kidney disease.  10.Dyslipidemia.  11.Elevated D-dimer.   RECOMMENDATIONS:  The patient was also interviewed and examined by Dr.  Lattie Haw.  Prior records have  been reviewed.  Of note, the patient  describes a similar episode occurring 2 months ago.  She had abdominal  discomfort and nausea prior to her episode of syncope at that time as  well.  Therefore, she now presents with two episodes of syncope that  sound vasovagal.  However,  we need to rule out arrhythmia.  Orthostatic  vital signs will be obtained.  As noted above, her beta-blocker was  recently discontinued.  It is doubtful that her current dose of  clonidine would be a factor in sinus bradycardia.  However, we will  taper her clonidine to off and increase her doxazosin from 2-4 mg a day.  Her doxazosin was recently started when her beta-blocker was  discontinued.  There is the possibility of orthostatic hypotension  secondary to first dose effect.  However, she has been on this drug for  a couple of days.  At this point, it seems doubtful that this was the  cause of her syncope.  She will have a follow-up blood pressure check  with the nurse in a couple weeks our office.  We will arrange an  outpatient event monitor and followup in the office to review the  findings and make further  recommendations.  After further review with  Dr. Lattie Haw, we feel it is safe for the patient be discharged to home  from a cardiac perspective.   Thank you very much for the consultation.      Richardson Dopp, PA-C      Cristopher Estimable. Lattie Haw, MD, [facility]  Electronically Signed    SW/MEDQ  D:  10/04/2008  T:  10/04/2008  Job:  [id number]   cc:   Leonides Grills, M.D.  Fax: [phone]

## 2010-10-20 NOTE — Discharge Summary (Signed)
NAMEDORREEN, OKE NO.:  [id number]   MEDICAL RECORD NO.:  [id number]          PATIENT TYPE:  INP   LOCATION:  A306                          FACILITY:  [facility]   PHYSICIAN:  Anselmo Pickler, DO    DATE OF BIRTH:  [birth year]   DATE OF ADMISSION:  10/04/2008  DATE OF DISCHARGE:  05/05/2010LH                               DISCHARGE SUMMARY   ADDENDUM:  Discharge summary due on Oct 05, 2008.   ADMISSION DIAGNOSES:  1. Syncopal episode.  2. History of diastolic heart failure.  3. History of gout.  4. History of chronic kidney disease.   DISCHARGE DIAGNOSES:  1. Seizures.  2. Syncopal episode.  3. A gouty attack.  4. History of diastolic heart failure.  5. Chronic kidney disease.  6. Constipation.   HOSPITAL COURSE:  Testing done since Oct 05, 2008 includes a CT of the  abdomen and pelvis without contrast, which shows pulmonary atelectasis,  cardiomegaly with a left atrial enlargement, unusual gallbladder  configuration, may represent incidental Phrygian cap; however mass  cannot be excluded.  Bilateral renal atrophy.  Perinephric stranding is  likely chronic, unremarkable bowel aside from occasional colonic  diverticula, and lumbar degenerative changes.  Ultrasound of her abdomen  shows no gallbladder mass identified.  Gallbladder has a serpentine  appearance, this considered incidental.  No significant sonographic  abnormalities identified.  Her hospital course on Oct 05, 2008 she was  discharged from our service.  Prior to leaving, she had a bowel  movement, in which she was found to have several syncopal episodes and  vomited.  At that point in time, it was determined that she would stay.  She was started on anti-seizure medication, also we continued to monitor  her.  She had several episodes while she was here, not as severe as the  first.  Also with the nausea and vomiting, I had Sandi Manus to see her  from GI as well as Dr. Merlene Laughter since she had a  positive EEG.  He saw  her, started her on IV Keppra.  GI saw her, an ultrasound of her abdomen  and a CT of her abdomen, which is described above.  It was determined  that she could go home today.  GI has recommended a bowel program, which  I have reviewed with the patient including Colace and MiraLax also to  pay attention to her body cues for bowel movements.  She will go home on  Keppra 250 mg p.o.  b.i.d.  Cardiology also saw her from before as written from her previous  discharge summary.  Nothing has changed.  We will also put her on  colchicine 0.1 mg one p.o. t.i.d. #30 for gout and she has to stop that  with any diarrhea.  She has to follow up with her primary care physician  Dr. Orson Ape next week and to return if symptoms worsen.      Anselmo Pickler, DO  Electronically Signed     CB/MEDQ  D:  10/09/2008  T:  10/10/2008  Job:  [id number]

## 2010-10-20 NOTE — H&P (Signed)
[name], KILLOREN NO.:  [id number]   MEDICAL RECORD NO.:  [id number]          PATIENT TYPE:  EMS   LOCATION:  ED                            FACILITY:  [facility]   PHYSICIAN:  Bonnielee Haff, MD     DATE OF BIRTH:  [birth year]   DATE OF ADMISSION:  11/03/2008  DATE OF DISCHARGE:  LH                              HISTORY & PHYSICAL   PRIMARY CARE PHYSICIAN:  Leonides Grills, M.D.   CARDIOLOGIST:  Cristopher Estimable. Lattie Haw, MD, [facility].   NEUROLOGIST:  Kofi A. Merlene Laughter, M.D.   GASTROENTEROLOGIST:  Caro Hight, M.D.  She is scheduled for a  colonoscopy this Wednesday, that is June 2.   ADMISSION DIAGNOSES:  1. Seizure in the setting of known seizure disorder.  2. Acute renal failure.  3. Atrial fibrillation with rapid ventricular response.  4. History of hypertension and dyslipidemia.  5. History of diastolic dysfunction.   CHIEF COMPLAINT:  Seizure.   HISTORY OF PRESENT ILLNESS:  The patient is a 77 year old African  American female who actually has been admitted to our service at least  once this year.  She was admitted on April 30 and was discharged on May  6.  The patient was diagnosed with seizure activity on EEG and was  started on Keppra.  The patient, according to her daughter, was in her  usual state of health until this afternoon when, I think, she was in the  bathroom when she was noted to pass out.  She had an episode where she  stared and then she became diaphoretic.  The daughter felt like she was  not breathing and so she laid her on the floor.  Then she started  breathing, woke up, and was back to her baseline.  The patient denies  any chest pain, shortness of breath, palpitations, headaches, nausea or  vomiting.  She had a regular bowel movement this morning.  According to  the daughter she has had at least three seizures in the last 2 weeks.   The patient also admits to some poor appetite, but denies any weight  loss.  Denies any event recorder in  the past.  She was told by Dr.  Lattie Haw because of her chronic bradycardia she may be a candidate for  pacemaker.   MEDICATIONS AT HOME:  1. Potassium chloride 20 mEq daily.  2. Lasix 40 mg daily.  3. Lisinopril 40 mg daily.  4. Aspirin 325 mg daily.  5. Zantac 150 mg twice daily.  6. Zocor 40 mg once daily.  7. Doxazosin 2 mg daily.  8. Keppra 250 mg twice a day.  9. Allopurinol 100 mg daily.  10.Colchicine 0.6 mg daily.  11.Meclizine as needed.  12.Reglan as needed.   ALLERGIES:  No known drug allergies.   PAST MEDICAL HISTORY:  Seizure disorder diagnosed in April of 2010 by  EEG, started on Keppra.  She has a history of diastolic dysfunction.  Her LV systolic function is about 80%.  She has a history of gout with  an acute exacerbation recently.  History of nephrolithiasis.  History of  hypertension, arthritis.  History of stroke and vertigo.  No history of  diabetes.  No history of coronary artery disease.  She has been on  anticoagulation in the past for stroke and presumed rheumatic heart  disease, but that was discontinued in 2009 after she had a retinal  hemorrhage.  She has a history of constipation on and off and was being  scheduled for a colonoscopy this Wednesday.   SOCIAL HISTORY:  She lives in [place] with her husband.  Denies  smoking, alcohol or illicit drug use.  She uses a wheelchair usually,  but can use a walker and a cane.   FAMILY HISTORY:  Positive for unknown cancer.   REVIEW OF SYSTEMS:  GENERAL:  Positive for weakness, poor appetite.  HEENT:  Unremarkable.  CARDIOVASCULAR:  As in HPI.  RESPIRATORY:  As in  HPI.  GASTROINTESTINAL:  Unremarkable.  GENITOURINARY:  Unremarkable.  NEUROLOGY:  As in HPI.  PSYCHIATRIC:  Unremarkable.  MUSCULOSKELETAL:  Unremarkable.  DERMATOLOGIC:  Unremarkable.   PHYSICAL EXAMINATION:  VITAL SIGNS:  Temperature not recorded.  Blood  pressure 120/57, heart rate initially 140s and currently between 90 and  110,  irregular.  Respiratory rate is 20.  Saturation is 99% on room air.  GENERAL:  Elderly African American female in no distress.  HEENT:  No pallor and no icterus.  Oral mucosa membrane is moist.  Left  eye appears to be nonfunctioning.  NECK:  Soft and supple.  No thyromegaly is appreciated.  No cervical  lymphadenopathy.  LUNGS:  Clear to auscultation bilaterally.  No wheezing, rales or  rhonchi.  No dullness to percussion.  CARDIOVASCULAR:  S1 and S2 is irregular.  Systolic murmur appreciated at  the aortic area.  No S3 or S4.  No rubs and no bruits.  ABDOMEN:  Soft, nontender and nondistended.  Bowel sounds are present.  No mass or organomegaly is appreciated.  No bruits are heard.  GENITOURINARY:  Deferred.  RECTAL:  Deferred.  NEUROLOGY:  She is alert and oriented x3.  No focal neurological  deficits are present.  SKIN:  Did not reveal any rashes.   LABORATORY DATA:  Her white count is 3.5, hemoglobin 11.5, MCV 89,  platelet count 158.  Her electrolytes are normal.  Glucose 107, BUN 55,  creatinine 3.08, BUN 14, creatinine 1.3 on May 5.  Calcium 9.8.  UA was  negative for infection.  Imaging studies include CT of head which showed  chronic small vessel ischemic changes without acute findings.  Chest x-  ray is pending.  EKG shows atrial fibrillation with a rate of 101 with  left axis deviation.  No acute ST or T wave changes are noted.  Possible  Q waves in lead 3.   ASSESSMENT:  This is a 77 year old African American female with a past  medical history as stated earlier presents with seizure activity and is  also found to have acute renal failure and atrial fibrillation with  rapid ventricular response.  Reason for acute renal failure likely is  poor p.o. intake along with continued use of diuretics and lisinopril.  She has poorly controlled seizure disorder and atrial fibrillation is  somewhat surprising in this individual who usually has chronic  bradycardia.   PLAN:  1.  Seizure episode in the setting of seizure disorder.  She is on      Keppra 250 mg b.i.d. and I think we have to increase this, so I  will increase the night dose to 500 mg.  If she has more seizures,      we will consult Dr. Merlene Laughter, otherwise outpatient follow-up will      be recommended.  2. Atrial fibrillation with rapid ventricular response.  She is on a      Cardizem drip which will be continued for a heart rate to be      maintained between 90 and 110 with good blood pressure parameters.      Cardiac enzymes will be cycled.  EKG will be repeated.  Cardiology      will be consulted.  This patient may be a candidate for pacemaker.      Because this is Memorial Day weekend, Cardiology is not available      until Tuesday.  The patient and family understand this.  I have      told them that if the situation changes, we will not hesitate to      call cardiology at [facility] to consider transfer.  3. Acute renal failure.  We will hold Lasix, lisinopril and we will      give her gentle hydration and monitor her renal function closely.  4. History of hypertension.  While she is on the Cardizem drip, I will      hold off on her other antihypertensive agents.  5. Chest x-ray will be checked because of new atrial fibrillation.      Deep venous thrombosis prophylaxis will be utilized.  I hesitate in      starting full dose anticoagulation because of bleeding issues in      the past.  I will continue with full dose aspirin until cardiology      sees her on Tuesday.   Further management decisions will depend on result of further testing  and the patient's response to treatment.      Bonnielee Haff, MD  Electronically Signed     GK/MEDQ  D:  11/03/2008  T:  11/03/2008  Job:  [id number]   cc:   Caro Hight, M.D.  [address]  [place] , [place] [address]   Dr. Orson Ape   Dr. Merlene Laughter   Dr. Lattie Haw

## 2010-10-20 NOTE — Consult Note (Signed)
[name], [name]             ACCOUNT NO.:  [id number]   MEDICAL RECORD NO.:  [id number]          PATIENT TYPE:  INP   LOCATION:  A306                          FACILITY:  [facility]   PHYSICIAN:  Caro Hight, M.D.      DATE OF BIRTH:  07-06-1926   DATE OF CONSULTATION:  10/07/2008  DATE OF DISCHARGE:                                 CONSULTATION   REFERRING PHYSICIAN:  Anselmo Pickler, DO.   PRIMARY PHYSICIAN:  Leonides Grills, M.D.   HISTORY OF PRESENT ILLNESS:  [name] is a 77 year old female who  has had problems with intermittent constipation for the last 1-2 years.  She has never had a colonoscopy.  She reports having a flare of gout in  her right foot prior to her hospitalization.  She said because of her  right foot being in pain, she had decreased mobility and developed  constipation.  When she strains to have a bowel movements, she becomes  near syncopal.  Her daughter helps provide some history.  The issue is  pain in her lower abdomen and left side when she is constipated.  She  states that she strains and she breaks out in a sweat and her daughter  reports she vomits.  The patient denies passing out.  She has had no  blood in the emesis or in her stool.  She says magnesium citrate makes  things better, she just needs a swallow.  She not been on a regimen for  constipation.  She does like Dulcolax because it causes cramps.  She has  never been on Amitiza or MiraLax.  She denies any problems swallowing or  weight loss.  Rarely has heartburn or indigestion.  Her appetite is  good.  She usually developed the symptoms if she does not go to the  bathroom after about 4 days.   PAST MEDICAL HISTORY:  1. Gout.  2. Constipation.  3. Hypertension.   PAST SURGICAL HISTORY:  None.   ALLERGIES:  No known drug allergies.   MEDICATIONS:  1. Zyloprim.  2. Ecotrin.  3. Catapres.  4. Cardura.  5. Lovenox.  6. Lasix.  7. Keppra.  8. Prinivil  9. Protonix twice a  day.  10.Senokot twice a day.  11.Zocor.   FAMILY HISTORY:  She has no family history of colon cancer or colon  polyps.   SOCIAL HISTORY:  She lives with her husband.  She has 5 children.  She  is retired from [employer]  She denies any tobacco or alcohol use.   REVIEW OF SYSTEMS:  She was seen and evaluated by cardiology.  No  further recommendation has been made except for follow-up as an  outpatient and eventually management of her hypertension.  She was also  seen and evaluated by Dr. Merlene Laughter for these episodes of near syncope  with diaphoresis.  She had left temple epileptiform discharge and has  been started on Keppra.  Her review of systems per the HPI, otherwise  all systems are negative.   PHYSICAL EXAMINATION:  T-max 98, systolic blood pressure [id number].  GENERAL:  She is in  no apparent distress, alert, interactive.  HEENT:  Exam is atraumatic, normocephalic.  Right pupil reactive.  Left eye  pupil  is dilated and unresponsive.  The mouth has no oral lesions.  Posterior pharynx without erythema or exudate.  NECK:  Full range of  motion.  No lymphadenopathy.  LUNGS:  Clear to auscultation bilaterally.  CARDIOVASCULAR EXAM:  Regular rhythm, normal S1-S2.  Unable to  appreciate a murmur.  ABDOMEN:  Soft, bowel sounds present, nontender,  nondistended.  No rebound or guarding, obese. EXTREMITIES:  No cyanosis  or edema.  NEURO:  She has no new focal neurologic deficits.   LABS:  White count 6.1, hemoglobin 10.9, platelets 218, creatinine 1.20.  Normal liver panel on Oct 05, 2008.   RADIOGRAPHIC STUDIES:  Last abdominal imaging was in 2006.  She had an  ultrasound which is remarkable for only a right kidney stone.   ASSESSMENT:  [name] is a 77 year old female who presents with  near syncope associated with constipation.  The near-syncope is likely  due to vagal response to Valsalva.  She also continues to complain of  right great toe pain and believes her gout is better  but not improved.  Thank you for allowing me to see Ms. [facility] consultation.  My  recommendations follow.   RECOMMENDATIONS:  1. While an inpatient, she should have Fibercon b.i.d. and MiraLax      once a day.  Will also add colchicine 0.1 mg t.i.d. for her gout.      It may also help regulate her stools.  As an outpatient, she may      decrease that to b.i.d.  She should follow up with Dr. Orson Ape for      additional management of gout.  2. As an outpatient, she should be on Benefiber twice a day and      MiraLax once a day.  The Benefiber is not available in the      hospital.  3. Will get a CT scan of abdomen and pelvis to rule out any evidence      of obstruction or stricture in the small bowel or colon.  She was      not enthusiastic about going through the prep for colonoscopy.  4. Will follow.      Caro Hight, M.D.  Electronically Signed    SM/MEDQ  D:  10/08/2008  T:  10/08/2008  Job:  [id number]   cc:   Leonides Grills, M.D.  Fax: [phone]

## 2010-10-20 NOTE — Letter (Signed)
July 14, 2007    Leonides Grills, M.D.  [address], [place] [address]   RE:  Meghan, White  MRN:  [id number]  /  DOB:  [birth year]   Dear Rush Landmark:   [name] developed bleeding in the right eye and was seen by an  ophthalmologist who recommended withholding warfarin for a number of  days.  This caused me to review her status and asked her to come to the  office for reassessment of the need for warfarin.   Reviewing the records, she suffered a left retinal artery occlusion in  2006, for which she was hospitalized.  A MRI of the brain showed  ischemic changes for which the neurologist recommend aspirin therapy.  She was subsequently evaluated by Dr. Wilhemina Cash who performed a TEE and  diagnosed rheumatic heart disease prompting him to recommend warfarin.  A subsequent echocardiogram read by me did not indicate the presence of  a rheumatic mitral valve.  The patient has had no subsequent events  except for an episode of difficulty swallowing and speaking for which  she was seen in the emergency department.  This was thought to represent  angioedema, not a neurologic event.   CURRENT MEDICATIONS:  1. Coumadin.  2. KCl 20 mEq daily.  3. Furosemide 60 mg daily.  4. Atenolol 50 mg daily.  5. Amlodipine 5 mg daily.  Hypertension has been fairly well-      controlled on this regimen.   PHYSICAL EXAMINATION:  GENERAL:  A pleasant woman in no acute distress.  VITAL SIGNS:  The weight is 202, unchanged from November, blood pressure  130/60, heart rate 60 and regular, respirations 18.  NECK:  No jugular  venous distention; no carotid bruits.  CARDIAC:  Normal S1, S2.  S4 present.  LUNGS:  Clear.  ABDOMEN:  Soft and nontender; no organomegaly.  EXTREMITIES:  No edema; erythema and swelling over the dorsum of the  right foot noted in November has resolved completely.   IMPRESSION:  This is a difficult situation with a retinal artery  embolism that typically represents an  atherosclerotic plaque, not a  cardioembolism.  Abnormalities on the MRI that could represent ischemic  strokes and divergent recommendations from previous consultants.  I have  reviewed the transesophageal echocardiogram.  There is no compelling  evidence on that study for rheumatic heart disease.  I believe the  safest course is to discontinue Coumadin.  The therapeutic options would  be aspirin alone, aspirin plus dipyridamole or aspirin plus Plavix.  For  the event that she suffered, aspirin should be adequate.  Her other  neurologic abnormalities may reflect hypertension, which is now much  better controlled.  [name] as previously  scheduled in a few months.    Sincerely,      Cristopher Estimable. Lattie Haw, MD, [facility]  Electronically Signed    RMR/MedQ  DD: 07/14/2007  DT: 07/17/2007  Job #: [id number]

## 2010-10-20 NOTE — Letter (Signed)
November 08, 2006    Leonides Grills, M.D.  [address] [place], [place] [address]   RE:  [name], [name]  MRN:  [id number]  /  DOB:  28-Sep-1926   Dear Rush Landmark:   Ms. Hollers is seen in the office today at her request.  She has noted  left arm pain for the past 2 weeks and now left chest pain.  Her  daughter in particular advised her to be seen.   The arm discomfort is most consistent with musculoskeletal etiology.  It  is worse with movement.  There is aching in the mid-humeral region.  There are no associated symptoms.  The symptoms have pretty much  resolved spontaneously at this point.   She occasionally notes left anterior chest discomfort that is sharp,  fairly severe, but brief in duration.  She has had some of this within  the past few days.  There is some residual ache at the present time.  There is no pleuritic component nor point tenderness.  She has had no  associated dyspnea, diaphoresis, nor nausea.   CURRENT MEDICATIONS:  1. Potassium chloride 20 mEq daily.  2. Furosemide 40 mg daily.  3. Warfarin as directed.  4. Atenolol 50 mg daily.  5. Amlodipine 10 mg daily.   PHYSICAL EXAMINATION:  GENERAL:  Overweight, pleasant woman in no acute  distress.  VITAL SIGNS:  The weight is 210, 17 pounds more than in October of 2007.  Heart rate 60 and regular, blood pressure 170/78.  NECK:  No jugular venous distension, no carotid bruits.  LUNGS:  Clear.  CARDIAC:  Normal first and second heart sounds.  Modest early systolic  ejection murmur.  ABDOMEN:  Obese.  Otherwise benign.  EXTREMITIES:  Trace edema.  Full range of motion of the left arm and  shoulder with no prominent point tenderness.   EKG:  Normal sinus rhythm, left atrial abnormality, delayed R wave  progression, minimal nonspecific T wave abnormality.   IMPRESSION:  Ms. Venditto symptoms are atypical.  Her arm discomfort  is clearly musculoskeletal in origin.  She has previously been diagnosed  with  tendinitis in that arm.  I have recommended Tylenol to be used no  more than 2 to 3 times a day on an as needed basis.  She is also  permitted to use Aleve, 200 mg of Motrin or 400 mg on occasion.  We will  reassess her symptoms in 2 weeks to determine if further treatment or  testing is warranted.   Blood pressure control has been lost.  This may be related to a 17 pound  weight gain.  We have provided her with some dietary advice and  increased amlodipine to 10 mg daily.   She will continue to be seen in the anticoagulation clinic.  A return  office visit will be planned for 2 weeks hence.    Sincerely,      Cristopher Estimable. Lattie Haw, MD, [facility]  Electronically Signed    RMR/MedQ  DD: 11/08/2006  DT: 11/08/2006  Job #: [id number]

## 2010-10-20 NOTE — Discharge Summary (Signed)
[name], [name] NO.:  [id number]   MEDICAL RECORD NO.:  [id number]          PATIENT TYPE:  INP   LOCATION:  3709                         FACILITY:  [facility]   PHYSICIAN:  Deboraha Sprang, MD, FACCDATE OF BIRTH:  [birth year]   DATE OF ADMISSION:  11/06/2008  DATE OF DISCHARGE:  11/07/2008                               DISCHARGE SUMMARY   This concerns an additional medication added this admission.  Senokot-S  2 tablets daily at bedtime.  This is over-the-counter.  The patient is  strongly recommended to obtain this medication, but to hold if she has  diarrhea.  Also, a map has been given to her at [employer].      [name], [place]      Deboraha Sprang, MD, [facility]  Electronically Signed    GM/MEDQ  D:  11/07/2008  T:  11/07/2008  Job:  [id number]   cc:   Leonides Grills, M.D.  Cristopher Estimable. Lattie Haw, MD, [facility]

## 2010-10-20 NOTE — H&P (Signed)
[name], ALA NO.:  [id number]   MEDICAL RECORD NO.:  [id number]          PATIENT TYPE:  EMS   LOCATION:  ED                            FACILITY:  [facility]   PHYSICIAN:  Bonnielee Haff, MD     DATE OF BIRTH:  09-Feb-1927   DATE OF ADMISSION:  10/03/2008  DATE OF DISCHARGE:  LH                              HISTORY & PHYSICAL   PMD:  Leonides Grills, M.D.   CARDIOLOGIST:  Cristopher Estimable. Lattie Haw, MD, [facility].   ADMISSION DIAGNOSES:  1. Syncope.  2. History of diastolic heart failure.  3. History of gout.  4. History of chronic kidney disease.   CHIEF COMPLAINT:  Passed out.   HISTORY OF PRESENT ILLNESS:  Patient is a 77 year old African American  female who was in her usual state of health until about 6:00 yesterday  evening when she ate a [employer] Peanut Butter.  She then started feeling  nauseous and experienced abdominal cramping.  She was actually at that  time sitting on a couch.  She went to her wheelchair in order to go to  the bathroom because of the nausea; however, at that time, she started  feeling even more unwell.  She felt sweaty.  She called one of her  daughters and asked her to come in, and then she did not know what  happened.  When the daughter arrived to the patient's bedside, she had  passed out. She was found to be extremely diaphoretic.  Then the  daughter shook the patient.  She started waking up and then started  complaining of some chest pain and appeared to be short of breath.  We  do not know exactly how many minutes this lasted.  At this time, she  denies any chest pain, nausea or vomiting.  Denies any abdominal pain.  She denies any focal weakness.  No shaking was noted by the daughter.  Apparently, this episode happened about 2 months ago as well but at that  time, she had not eaten anything.  Even at that time, there was no focal  weakness.  No history of any fevers or chills.  No cough.  She had  another episode about 2 years  ago, which was her first episode.   Yesterday evening when the daughter saw her, she checked her blood  pressure, and it was 112/40.  They waited it out, and then she checked  the blood pressure again.  It was 90/40, which prompted a visit to the  ED.   MEDICATIONS AT HOME:  1. Potassium chloride 20 mEq daily.  2. Lasix 40 mg daily.  3. Lisinopril 240 mg daily.  4. Aspirin 325 mg daily.  5. Catapres 0.1 mg b.i.d.  6. Zantac 150 mg b.i.d.  7. Zocor 40 mg at bedtime.  8. Doxazosin 2 mg once daily was started just a few days ago.  9. She was asked to discontinue atenolol a few days ago.  10.Protonix 40 mg daily.  11.Allopurinol 100 mg daily.  12.She was on colchicine up until a few days ago for her acute gout.  The Allopurinol is a new medication, started last week.   ALLERGIES:  No known drug allergies.   PAST MEDICAL HISTORY:  1. Positive most recently for acute gout diagnosed by her PMD, for      which she is on Allopurinol as well as colchicine.  Apparently, a      uric acid level checked recently was greater than 10.  2. Remote history of nephrolithiasis.  3. Diastolic congestive heart failure, left ventricular EF of about      60%.  4. History of MS but the most recent echo, no MS was noted.  Trivial      MR was noted.  5. Hypertension, for which she is on multiple medications.  6. Arthritis.  7. Apparently, she has had a stroke in the past.  8. Vertigo in the past.   Denies any surgical history.  No history of diabetes.  No history of MI.   SOCIAL HISTORY:  Lives in [place] with her husband.  Denies  smoking, alcohol, or illicit drug use.  Currently in a wheelchair  because of acute gout but usually uses a cane to ambulate.   FAMILY HISTORY:  Positive for unknown cancer.   REVIEW OF SYSTEMS:  GENERAL:  Positive for weakness.  HEENT:  Unremarkable.  CARDIOVASCULAR:  As in HPI.  RESPIRATORY:  As in HPI.  GI:  As in HPI.  GU:  Unremarkable.  NEUROLOGIC:   Unremarkable except as  in HPI.  MUSCULOSKELETAL:  Unremarkable.  DERMATOLOGICAL:  Unremarkable.  Other systems unremarkable.   PHYSICAL EXAMINATION:  VITAL SIGNS:  Temperature 97.5, blood pressure  130/65.  No orthostatics have been checked.  Heart rate in the 60's and  50's lately, sinus, regular.  Respiratory rate is 18.  Saturation 100%.  She is currently on 2 liters by nasal cannula.  GENERAL:  An obese elderly female in no distress.  HEENT:  There is no pallor, no icterus.  Oral mucous membranes are  moist.  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  NECK:  Soft and supple.  No thyromegaly is appreciated.  No cervical  lymphadenopathy is noted.  LUNGS:  Clear to auscultation bilaterally.  No wheezing, rales, or  rhonchi.  No dullness to percussion.  CARDIOVASCULAR:  S1 and S2 is normal, regular.  A systolic murmur  appreciated at the aortic area.  No S3, S4, no rubs, no bruits.  ABDOMEN:  Soft, nontender, nondistended.  Bowel sounds are present. No  masses or organomegaly is appreciated.  No bruits are heard.  GU:  Deferred.  RECTAL:  Deferred.  NEUROLOGIC:  She is alert and oriented x3.  No focal neurological  deficits are present at this time.  MUSCULOSKELETAL:  The right foot is a little bit erythematous with the  right MTP joint showing deviation.  There is some gouty tophi noted.  No  warmth is present.  The left foot appears to be normal.  Peripheral  pulses are present bilaterally.   LAB DATA:  Her CBC is unremarkable.  BMET shows a glucose of 133, BUN  45, creatinine 1.75, calcium 10.1.  Cardiac markers negative x2.  BNP is  254.   She had a CT head which showed atrophy and chronic small vessel disease  without any acute abnormalities.   Chest x-ray showed cardiomegaly without acute disease.   She had an EKG which shows sinus rhythm at a rate of 62.  Intervals  appear to be in the normal range.  No Q  waves are present.  No  concerning ST-T wave changes are noted.  Q-T  interval is normal.   ASSESSMENT:  This is a 77 year old [place] American female who presents  after a syncopal episode.  This is the second episode in the last 2  months.  Etiology is not very clear.  She does have bradycardia, but she  is chronically bradycardic.  It sounds like she may have had some kind  of a vagal reaction, which could have prompted the episode today.  Unclear what happened 2 months ago.  She also experienced some chest  pain and shortness of breath, which is concerning.  PE is a possibility.  Cardiac etiology less likely.  She does not have any severe valvular  disease that could have caused this.  Patient was resting when this  happened.  A seizure is also another possibility.  She does have a  history of stroke; however, this does not appear to be an acute  cerebrovascular accident.   PLAN:  1. Syncope:  We will check a D-dimer.  Check orthostatics.  An      echocardiogram will be repeated, as the one we have is from June,      2009.  No need to do a carotid Doppler, as she had one just a      couple of weeks ago which showed less than 50% stenosis      bilaterally.  An event monitor will be arranged through [facility].  EKG will be repeated.  Consider an MRI of the brain,      considering her history of stroke and recurrent episodes of      syncope.  PT evaluation will be obtained as well. EEG.  2. Acute gout:  This seems to be subsiding.  She was on colchicine,      but she is not taking that anymore.  Continue with Allopurinol.      Actually, we can go ahead and increase the dose to 200 mg daily.      Her GFR is 34, so there is room to go up on the Allopurinol.  We      also will check a repeat uric acid level.  3. History of diastolic congestive heart failure:  She is euvolemic.      Continue with her Lasix, although her BUN is slightly elevated.  We      will follow this closely.  4. History of hypertension:  Continue with most of her       antihypertensive medications except for the doxazosin for now.  We      will see how she does in the hospital before restarting it.  5. History of stroke, stable.  6. Deep venous thrombosis prophylaxis will be initiated.  7. She is a full code.   Further management decision will depend on the results of further  testing and patient's response to treatment.      Bonnielee Haff, MD  Electronically Signed     GK/MEDQ  D:  10/04/2008  T:  10/04/2008  Job:  [id number]   cc:   Cristopher Estimable. Lattie Haw, MD, [facility]  [address]  [place], [place] [address]   Bonne Dolores, M.D.  Fax: [phone]

## 2010-10-20 NOTE — Letter (Signed)
April 12, 2008    Leonides Grills, MD  [address] [place],  [address]   RE:  Meghan, White  MRN:  [id number]  /  DOB:  [birth year]   Dear Rush Landmark:   [name] was seen in the office again at the request of her  daughter.  She was recently evaluated in the emergency department for  lightheadedness and felt to be constipated.  Since constipation was  relieved, she has not had any additional symptoms.  She feels that her  daughter is meddling in her affairs.   CURRENT MEDICATIONS:  1. KCl 20 mEq daily.  2. Atenolol 50 mg daily.  3. Furosemide 40 mg daily.  4. Verapamil 120 mg daily.  5. Lisinopril 10 mg daily.  6. Aspirin 325 mg daily.   PHYSICAL EXAMINATION:  GENERAL:  Pleasant, somewhat overweight woman in  no acute distress.  VITAL SIGNS:  The weight is 195, 7 pounds less than 3 months ago.  Blood  pressure 140/60 without orthostatic change.  Heart rate 52 and regular.  NECK:  No jugular venous distention; no carotid bruits.  LUNGS:  Clear.  CARDIAC:  Normal first and second heart sounds; fourth heart sound  present with minimal early systolic murmur.  ABDOMEN:  Soft and nontender; no bruits; no organomegaly.  EXTREMITIES:  No edema.   IMPRESSION:  [name] is doing generally well.  We will discontinue  verapamil since it appears to be contributing to constipation and start  clonidine-TTS level 2 every week.  If that medication is prohibitively  expensive, we will try a very low dose of oral clonidine.  [name]  will return to see the nurses in 1 month for reassessment of blood  pressure.  I will see her again in 6 months as previously planned.    Sincerely,      Cristopher Estimable. Lattie Haw, MD, [facility]  Electronically Signed    RMR/MedQ  DD: 04/12/2008  DT: 04/13/2008  Job #: [id number]

## 2010-10-20 NOTE — Letter (Signed)
September 30, 2008    Meghan Grills, MD  [address] [place],  [address]   RE:  MHIA, MATHRE  MRN:  [id number]  /  DOB:  Nov 23, 1926   Dear Rush Landmark:   [name] returns to the office for continued assessment and  treatment of hypertension, right facial discomfort, and a host of other  medical issues.  As you know, she was recently seen in your office for  recurrent gout of the right first MTP joint.  She responded to  colchicine and is now on allopurinol 100 mg daily, adjusted for her  degree of renal insufficiency.  The symptoms on the right side of her  face, the etiology of which is somewhat unclear, have improved.  An  ultrasound study of the carotids showed no progression of disease with  mild plaque bilaterally.  She has episodes of vertigo, but not  presyncope.  She has had chronic bradycardia.  Blood pressure control  has apparently been fairly good.   CURRENT MEDICATIONS:  Unchanged from her last visit except for the  addition of allopurinol.  She took a course of colchicine with  improvement of the pain in her right foot.   PHYSICAL EXAMINATION:  GENERAL:  Overweight pleasant woman seated in a  wheelchair.  VITAL SIGNS:  The blood pressure and was 140/60, heart rate 48 and  regular, respirations 10 and unlabored.  NECK:  No jugular venous distention.  LUNGS:  Clear.  CARDIAC:  Normal first and second heart sounds; modest systolic ejection  murmur.  ABDOMEN:  Soft and nontender; no organomegaly.  EXTREMITIES:  Mild residual erythema and edema around the right first  MTP; pain is much improved.   IMPRESSION:  [name] is doing well overall.  Due to progressive of  bradycardia, atenolol will be discontinued.  We will substitute Cardura  with initial dose of 1 mg and then at 2 mg per day.  She will monitor  blood pressures at home and report any elevated values.   Her gout is clinically improved.  Her last chemistry profile done few  days ago showed a  spontaneous improvement in her creatinine to 1.17.  Uric acid was 10.4.  A repeat chemistry profile will be obtained in 2  months along with a CBC.  [name] in 8 months.    Sincerely,      Cristopher Estimable. Lattie Haw, MD, [facility]  Electronically Signed    RMR/MedQ  DD: 09/30/2008  DT: 10/01/2008  Job #: [id number]

## 2010-10-20 NOTE — Group Therapy Note (Signed)
[name], [name]             ACCOUNT NO.:  [id number]   MEDICAL RECORD NO.:  [id number]          PATIENT TYPE:  INP   LOCATION:  A306                          FACILITY:  [facility]   PHYSICIAN:  Kofi A. Merlene Laughter, M.D. DATE OF BIRTH:  1926-12-22   DATE OF PROCEDURE:  DATE OF DISCHARGE:                                 PROGRESS NOTE   No spells are reported, but she complains of abdominal discomfort and  just feeling sick all over.  The patient is being evaluated by Dr. Stann Mainland  from the GI service.  Overall, she appears to tolerate the Keppra well,  although there are still significant concerns as to the etiology of the  abdominal discomfort and nausea, which predated the onset of the Keppra.   Temperature 98.2, pulse 95, respirations 18, and blood pressure 139/70.  The patient is awake and alert, she converses well.  Speech, language  and cognition are intact.  Cranial nerves:  She does have large left  pupil that is fixed; pupil on the right is 3 mm and reactive.  Extraocular movements are full.  Facial muscle strength symmetric.  She  has no pronator drift and coordination shows no dysmetria.   ASSESSMENT AND PLAN:  Recurrent spells, unclear etiology, she does have  an abnormal EEG and therefore, we are treating her empirically.  She  also continues to have the spells of nausea, possibly related to her  spells or the cause of her spell ( unclear).  For now, we will continue  with low dose Keppra, and see what input Dr. Stann Mainland has.      Kofi A. Merlene Laughter, M.D.  Electronically Signed     KAD/MEDQ  D:  10/08/2008  T:  10/09/2008  Job:  [id number]

## 2010-10-20 NOTE — Group Therapy Note (Signed)
NAMESHAELYN, JOPLIN             ACCOUNT NO.:  [id number]   MEDICAL RECORD NO.:  [id number]          PATIENT TYPE:  INP   LOCATION:  A340                          FACILITY:  [facility]   PHYSICIAN:  Kofi A. Merlene Laughter, M.D. DATE OF BIRTH:  1927/01/29   DATE OF PROCEDURE:  DATE OF DISCHARGE:                                 PROGRESS NOTE   SUBJECTIVE:  The patient does not have any complaints.  No spells are  reported, although, the patient became hypotensive with systolic of 60  while using the commode.  She was seen by Dr. Lattie Haw who has  discontinued all cardiovascular medication and also Keppra.  He believed  that she may be at risk of developing sick sinus syndrome and increased  vagal output.   OBJECTIVE:  Today, she had a temperature of 97.3, pulse 76, respirations  20, blood pressure 130/62.  She is awake and alert and converses well.  Speech and language is normal.  She moved all four extremities.  Sodium  142, potassium 3.8, chloride 115, CO2 22, BUN 19, creatinine 1.3,  glucose 78, calcium 9.8.   ASSESSMENT/PLAN:  Recurrent spells of unresponsiveness and shaking  associated mostly with using the commode.  She has had a normal EEG in  the past, but given the associated abdominal discomfort and hypotension,  it is not quite clear exactly if these spells are epileptic or due to  cardiovascular problems.  I will continue to follow the patient.  All  her medications have been held, and she is possibly being evaluated for  a pacemaker by Cardiology.      Kofi A. Merlene Laughter, M.D.  Electronically Signed     KAD/MEDQ  D:  11/06/2008  T:  11/06/2008  Job:  [id number]

## 2010-10-20 NOTE — Letter (Signed)
December 21, 2007    Leonides Grills, M.D.  [address], [place] [address]   RE:  [name], [name]  MRN:  [id number]  /  DOB:  1926/12/14   Dear Rush Landmark,   Ms. Osby returns to the office for continued assessment and  treatment of increased peripheral edema.  She has had some issues with  fluid retention and has been maintained on furosemide for some time.  Her problem is probably a combination of a sedentary lifestyle, aging  with venous changes, maintaining leg dependency, and less than optimal  salt intake.  Since her last visit, she has done somewhat better.  An  echocardiogram shows no change from 2007.  She continues to have very  mild mitral stenosis, probably related to annular calcification rather  than intrinsic rheumatic disease, and preserved left ventricular  systolic function.  An ultrasound study of her leg veins are negative.  Renal function has been stable with creatinine values around 2.0.   Current medications are unchanged from her last visit.   PHYSICAL EXAMINATION:  GENERAL:  Pleasant overweight woman.  VITAL SIGNS:  The weight is 202 pounds, 3 pounds less than at her last  visit.  Blood pressure 135/60, heart rate 50 and regular, and  respirations 17.  NECK:  No jugular venous distention; minimal left carotid bruit.  LUNGS:  Clear.  CARDIAC:  Normal first and second heart sounds; modest systolic ejection  murmur; splitting of the first heart sound.  ABDOMEN:  Soft and nontender; no organomegaly.  EXTREMITIES:  A 1-2+ pitting ankle edema; distal pulses intact.   IMPRESSION:  Ms. Kucharek degree of edema is really quite acceptable.  Nonetheless, we will try to improve this by discontinuing Norvasc and  substituting verapamil 120 mg daily and lisinopril 10 mg daily.  The  latter medication is indicated for her chronic renal disease as well.  We will follow metabolic profiles on a monthly basis and see this nice  woman again in the office in 2  months.    Sincerely,      Cristopher Estimable. Lattie Haw, MD, [facility]  Electronically Signed    RMR/MedQ  DD: 12/21/2007  DT: 12/22/2007  Job #: [id number]

## 2010-10-20 NOTE — Letter (Signed)
April 20, 2007    [name], M.D.  [address], [place] [address]   RE:  Meghan, White  MRN:  [id number]  /  DOB:  07-11-1926   Dear [name],   [name] returns to the office to follow up possible congestive  heart failure.  She is breathing better and notices less wheezing.   Her laboratory was unremarkable, except for a BNP level that was  moderately elevated to 484.   Of interest, her chest x-ray returned with a reading of possible left  lower lobe pneumonia.  The patient has not had any signs nor symptoms to  suggest that this was, in fact, correct.   Medications are unchanged except for the increase in her dose of Lasix.   [name]'s complaint today is of right-foot pain that she noticed  starting this morning.  There was no apparent injury.  She has never had  gout.  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   On exam, a very pleasant woman, in no acute distress.  The weight is 201, five pounds less than one week ago.  Blood pressure  135/65, heart rate 50 and regular, respirations 16.  NECK:  No jugular venous distention.  LUNGS:  Entirely clear.  CARDIAC:  Normal first and second heart sounds; fourth heart sound  present.  ABDOMEN:  Soft and nontender; no organomegaly.  EXTREMITIES:  No edema over the ankles, which is a substantial  improvement from last week.  She does have edema over the dorsum of the  right foot with erythema and a bit of warmth plus moderate tenderness to  palpation.   IMPRESSION:  [name] has improved with a higher dose of diuretic.  Interestingly, her renal function has improved, as well.  She will  continue her current medications with respect to her cardiac status.   It is not clear what the problem with her right foot is.  This does not  appear hot enough to be a cellulitis.  It is not over a joint.  There is  no bursa or other potentially offensive structure in that region.  I  suggest  that she treat this conservatively with Aleve and local heat.  An x-ray of her foot, as well as a repeat chest x-ray, will be obtained,  as well as a uric acid level.  If symptoms persist, she will consult you  regarding further assessment and treatment.  I will plan to see this  nice woman again in six months.    Sincerely,      Cristopher Estimable. Lattie Haw, MD, [facility]  Electronically Signed    RMR/MedQ  DD: 04/20/2007  DT: 04/21/2007  Job #: [id number]

## 2010-10-20 NOTE — Letter (Signed)
February 19, 2008    Leonides Grills, MD  [address], [place] [address]   RE:  CIGI, PICCOLI  MRN:  [id number]  /  DOB:  [birth year]   Dear Rush Landmark,   Ms. Pupillo returns to the office for continued assessment and  treatment of pedal edema.  Since her last visit, she has done quite  well.  She only notes significant edema when her legs are dependent for  a considerable period of time.  Blood pressure control has been good.  She notes no lightheadedness nor syncope.   MEDICATIONS:  Medications are unchanged.  1. KCl 20 mEq daily.  2. Atenolol 50 mg daily.  3. Furosemide 40 mg daily.  4. Verapamil 120 mg daily.  5. Lisinopril 10 mg daily.   PHYSICAL EXAMINATION:  GENERAL:  Overweight pleasant woman in no acute  distress.  VITALS:  The weight is 197, 6 pounds less than at her last visit, blood  pressure 145/60, heart rate 55 and regular, and respirations 14.  NECK:  No jugular venous distention; normal carotid upstrokes without bruits.  LUNGS:  Clear.  CARDIAC:  Normal first and second heart sounds; fourth heart sound  present.  ABDOMEN:  Soft and nontender; no organomegaly.  EXTREMITIES:  1+ pitting ankle edema.   Most recent chemistry profile was in August at which time, BUN and  creatinine were stable at 40 and 2.0.   IMPRESSION:  Ms. Hazelip is doing well with adjustment of medications.  Control of hypertension in the setting of renal insufficiency is  somewhat suboptimal.  We will increase verapamil to 180 mg daily and  lisinopril to 20 mg daily.  A chemistry profile will be obtained in 3  months.  I will reassess this nice woman in 7 months.   ADDENDUM:  Rhythm strip shows sinus bradycardia rate of 46.  Atenolol  will be decreased to 25 mg daily.    Sincerely,      Cristopher Estimable. Lattie Haw, MD, [facility]  Electronically Signed    RMR/MedQ  DD: 02/19/2008  DT: 02/20/2008  Job #: [id number]

## 2010-10-21 ENCOUNTER — Ambulatory Visit (INDEPENDENT_AMBULATORY_CARE_PROVIDER_SITE_OTHER): Payer: Medicare Other | Admitting: *Deleted

## 2010-10-21 DIAGNOSIS — I4891 Unspecified atrial fibrillation: Secondary | ICD-10-CM

## 2010-10-21 LAB — POCT INR: INR: 2.9

## 2010-10-23 NOTE — Letter (Signed)
March 16, 2006    Leonides Grills, M.D.  [address], [place] [address]   RE:  Meghan, White  MRN:  [id number]  /  DOB:  22-Oct-1926   Dear Rush Landmark:   [name] is seen in the office today at the request of my office staff.  She presented for Coumadin management and reported chest discomfort last  night.  This was a mild aching sensation in the epigastric region and at the  costal margins bilaterally.  There was no other radiation.  There were no  associated symptoms.  She had a sensation as if eructation would help, but  it did not provide much relief.  There was intermittent discomfort overnight  and some residual soreness after it resolved spontaneously.  She did take a  Zantac at bedtime.   She has seen Dr. Wilhemina Cash in the past for an embolus to her eye with known  mild mitral stenosis for which she takes Coumadin and is followed here.  She  has had no dyspnea.  She has had no neurologic symptoms.   Current medications include:  1. KCL 20 mEq daily.  2. Furosemide 40 mg daily.  3. Warfarin as directed.  4. Atenolol 50 mg daily.  5. Amlodipine 5 mg daily.   On exam, pleasant, overweight woman in no acute distress.  Her weight is  193, 4 pounds more than in July.  Blood pressure 110/70, heart rate 60 and  regular, respirations 16.  NECK:  No jugular venous distention, normal carotid upstroke without bruits.  LUNGS:  Decreased breath sounds at the bases.  CARDIAC:  Normal splitting of the first heart sound, normal second heart  sound, no murmurs appreciated.  ABDOMEN:  Soft, nontender, no organomegaly.  EXTREMITIES:  Trace edema, distal pulses intact.   EKG reveals sinus bradycardia; left anterior fascicular block; minimal  voltage criteria for LVH, markedly delayed R-wave progression - cannot  exclude prior anterior septal myocardial infarction.  When compared to a  previous tracing obtained in October 2006, R wave progression was previously  normal.  The  axis has shifted to the left.   IMPRESSION:  [name] presents with nonspecific upper abdominal/lower  chest discomfort.  By her description, a GI etiology is most likely.  She  had a negative pharmacologic stress nuclear study in October 2006.  I do not  think repeat testing is warranted at this time.  She is advised to use  antacids as needed.  If symptoms persist or worsen or are associated with  dyspnea, diaphoresis, or syncope, she will immediately seek medical  attention.   A CBC was normal a few months ago.  A chemistry profile will be obtained as  well as stool for hemoccult testing.  She will present to your office  shortly for influenza vaccine.  I will plan to see this nice woman again in  one year.  She will be followed in the anticoagulation clinic in the  interim.    Sincerely,     Cristopher Estimable. Lattie Haw, MD, [facility]    RMR/MedQ  /  Job #:  [id number]  DD:  03/16/2006 / DT:  03/17/2006

## 2010-10-23 NOTE — H&P (Signed)
[name], [name] NO.:  [id number]   MEDICAL RECORD NO.:  [id number]          PATIENT TYPE:  EMS   LOCATION:  ED                            FACILITY:  [facility]   PHYSICIAN:  Leonides Grills, M.D.DATE OF BIRTH:  [birth year]   DATE OF ADMISSION:  11/28/2004  DATE OF DISCHARGE:  LH                                HISTORY & PHYSICAL   CHIEF COMPLAINT:  Can't see out left eye.   PRESENTING ILLNESS:  This is a 77 year old female with a history of  hypertension which has been well controlled who presents to emergency room  approximately 9 hours after sudden onset of blindness in her left eye.  Patient states she was washing her face this morning after arising and  noticed it suddenly got dark and apparently she could not see out of her  left eye.  Patient again presented to emergency room 9 hours later.  Dr. Jeanell Sparrow  in the emergency room felt patient had a CVA since funduscopic exam showed  closing of the left optic artery.  Stroke team at [facility] was consulted.  Dr. Brett Fairy basically recommended heparinization stating that the window of  intervention had passed.  Patient basically will be admitted for  heparinization and workup of the source of what appears to be an embolic  stroke.  Dr. Brett Fairy recommended heparinization without bolus.   PAST MEDICAL HISTORY:  She is allergic to no medications.  She currently  takes atenolol 50 mg and Zestril unsure of the dose at this time.   REVIEW OF SYSTEMS:  She states she has had some bronchitis-like symptoms  last week or two for which she was seen in the office 2 weeks ago for.  Patient denies any other symptoms of a stroke other than as stated she had  some headache in the back of her head today with some foolish feeling.  Denies paresthesia or weakness, abnormal gait or speech or thought  processes.  Patient also denies chest pain, nausea, vomiting or diarrhea.   SOCIAL HISTORY:  Denies smoking, alcohol or drug use.   PHYSICAL EXAMINATION:  VITAL SIGNS:  Blood pressure is 179/90, pulse is 50,  respirations are 20, O2 saturations are 100% on 2 L.  HEENT:  TMs are normal.  Pupils are equal; the left pupil is nonreactive,  after dilatation her funduscopic exam reveals a pale-appearing optic disk  and optic artery whereas the optic artery on the right is full and red, disk  appears normal.  NECK:  Supple without JVD or bruit or thyromegaly.  LUNGS:  Essentially clear in all areas.  HEART:  Regular sinus rhythm without murmur, gallop or rub.  ABDOMEN:  Soft, nontender.  EXTREMITIES:  Without clubbing, cyanosis or edema.  NEUROLOGIC EXAMINATION:  Speech is intact.  Motor is full.  Reflexes are  bilaterally equal.  Sensory is intact.   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       WMM/MEDQ  D:  11/28/2004  T:  11/28/2004  Job:  [id number]

## 2010-10-23 NOTE — Discharge Summary (Signed)
[name], [name]             ACCOUNT NO.:  [id number]   MEDICAL RECORD NO.:  [id number]          PATIENT TYPE:  INP   LOCATION:  A203                          FACILITY:  [facility]   PHYSICIAN:  Leonides Grills, M.D.DATE OF BIRTH:  Jul 10, 1926   DATE OF ADMISSION:  11/28/2004  DATE OF DISCHARGE:  06/29/2006LH                                 DISCHARGE SUMMARY   DISCHARGE DIAGNOSES:  1.  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  2. Mild      congestive heart failure.  3. Mitral stenosis.  4. Hypertension.   HOSPITAL COURSE:  This 77 year old female was admitted to the emergency room  stating that she awoke that day with blindness in her left eye.  The patient  presented approximately 9 hours after the event.  She was not a candidate  for acute intervention.  The neurologist at [facility] was consulted and  recommended admission for observation with heparinization without bolus.   The patient was admitted to the floor.  She was started on a heparin drip.  Consultations were ordered for ophthalmology and neurology.  The patient  also underwent an MRA and MRI of the brain after initial CT scan showed no  acute bleeding.  MRA and MRI results showed widespread chronic microvascular  ischemic changes, also scattered areas of acute infarction, in the right  genu of the corpus callosum, left __________ , and right thalamus.  Angiography showed intracranial atherosclerosis without significant  stenosis.  Carotid Dopplers showed some mild plaque formation and no  significant stenosis.   The patient's labs showed hemoglobin 11.9 on admission.  Sed rate was 25.  Electrolytes were within normal range.  Initial chest x-ray showed some mild  pulmonary edema.  The patient was placed on Lasix, and a followup chest film  showed improvement.  Her cholesterol was 157, triglycerides 136, HDL 45, LDL  85.  Vitamin B12 was normal at 559, and her homocysteine levels were  slightly elevated at 58.   Again, the patient was seen by Dr. Iona Hansen for ophthalmology and was offered  __________  treatment.  Will see the patient in followup as an outpatient.  Dr. __________  also saw the patient while she was in the hospital and  recommended Coumadin anticoagulation.  The patient underwent also an  echocardiogram to look for a source of her embolic events, and this showed a  possible vegetative lesion on the mitral valve.  A consultation was obtained  with Dr. Scarlett Presto, and subsequent transesophageal echocardiogram was  obtained.  Basically, it was felt this mitral valve showed some stenosis.  She also had a patent foramen ovale with left-to-right flow.  Along with the  mitral valve being markedly thickened, leaflet tips were thickened with some  areas of irregularity but no obvious vegetation.  There was mild mitral  regurgitation.  Based on these findings, Dr. Wilhemina Cash felt there appeared to  be a cardioembolic source for the thrombus with the mitral stenosis and  recommended lifelong Coumadin therapy.   The patient is going to be discharged home on previous medications which  were atenolol and Zestril along with Lasix, K-Dur, and Coumadin.  She  received 10 mg of Coumadin the night before discharge, and INR was 1.2.  She  will be continued on 5 mg daily and will be rechecked on December 07, 2004, at  the office and followed in the office in 2 weeks.       WMM/MEDQ  D:  12/03/2004  T:  12/03/2004  Job:  [id number]

## 2010-10-23 NOTE — Group Therapy Note (Signed)
NAMEKODI, SCHERGER             ACCOUNT NO.:  [id number]   MEDICAL RECORD NO.:  [id number]          PATIENT TYPE:  INP   LOCATION:  A306                          FACILITY:  [facility]   PHYSICIAN:  Kofi A. Merlene Laughter, M.D. DATE OF BIRTH:  [birth year]   DATE OF PROCEDURE:  DATE OF DISCHARGE:  10/09/2008                                 PROGRESS NOTE   The patient appears to be feeling a lot better regarding abdominal  discomfort.  No recurrent spells are reported.  Temperature 97.3, pulse  88, and blood pressure 97/49.  The patient is awake, alert, and  converses well.  She moves all 4 extremities.   ASSESSMENT AND PLAN:  Recurrent spells, unclear etiology.  Given her  abnormal electroencephalogram, she is being treated empirically.  She  has tolerated some food today.  I think, we will continue with the  current care, possibly discharge today.      Kofi A. Merlene Laughter, M.D.  Electronically Signed     KAD/MEDQ  D:  10/10/2008  T:  10/11/2008  Job:  [id number]

## 2010-10-23 NOTE — Procedures (Signed)
[name], [name]             ACCOUNT NO.:  [id number]   MEDICAL RECORD NO.:  [id number]          PATIENT TYPE:  INP   LOCATION:  A203                          FACILITY:  [facility]   PHYSICIAN:  Leslye Peer, MD       DATE OF BIRTH:  March 28, 1927   DATE OF PROCEDURE:  DATE OF DISCHARGE:                                  ECHOCARDIOGRAM   INDICATION:  CVA.   The technical quality of this study is reasonable.   The aorta is within normal limits at 2.7 cm.   The left atrium is markedly dilated.  No obvious clots or masses are noted  within the left atrium.  The interventricular septum and posterior wall are  mildly thickened, measured at 1.3 cm for each.  There is additional basal  septal hypertrophy noted as is common in the elderly.   The aortic valve appears to be trileaflet with mild sclerosis but no  significant stenosis.  There is mild aortic insufficiency noted.  Doppler  interrogation of the aortic valve is within normal limits.   The mitral valve appears to have heavy mitral annular calcification with  thickening of the anterior and posterior mitral valve leaflets as well.  There is some degree of mitral regurgitation, but this is not able to be  quantified on this study.  There does appear to be some restriction to  leaflet mobility.  Doppler interrogation of the mitral valve reveals a mean  gradient of 6 mmHg with an area by Doppler of 2.2 cm sq, suggestive of mild  to moderate mitral stenosis.  I would lean towards the milder end of the  spectrum.  Additionally, there appears to be a mobile echodensity,  suggestive of possible vegetation.  This is not well visualized secondary to  heavy mitral annular calcification.  This vegetation appears to be  associated with the posterior mitral valve leaflet.   The pulmonic valve is not well visualized.   The tricuspid valve appears grossly structurally normal with mild tricuspid  regurgitation noted.  There is mild tricuspid  regurgitation.  There is no  suggestion of pulmonary hypertension on this study.   The left ventricle is normal in size with LVIDd measured at 4.2 cm and the  LVIDs measured at 2.4 cm.  Overall left ventricular systolic function is  normal, and no regional wall motion abnormalities are noted.   The right atrium appears mildly dilated while the right ventricle appears  normal in size with normal right ventricular systolic function.  There is a  small posterior pericardial effusion without evidence of hemodynamic  compromise.   IMPRESSION:  1.  Marked left atrial enlargement.  2.  Mild concentric left ventricular hypertrophy, with additional basal      septal hypertrophy as is common in the elderly.  3.  Mild aortic sclerosis, without stenosis.  4.  Mild aortic insufficiency.  5.  Heavy mitral annular calcification, with thickening of the mitral valve      leaflets as well.  There does appear to be some restriction to leaflet      mobility.  Doppler studies  are consistent with mild to moderate mitral      stenosis, and I would err towards the milder end of the spectrum.  There      appears to be some degree of mitral regurgitation, but this is not well      visualized secondary to the heavy annular calcification.  I am unable to      quantify the mitral regurgitation on this study.  6.  There is a small, mobile echodensity which appears to be associated with      the posterior mitral valve leaflet, suggestive of vegetation.  This is      not well visualized secondary to heavy mitral annular calcification.  7.  Mild tricuspid regurgitation.  8.  Normal left ventricular size and systolic function, without regional      wall motion abnormality noted.  9.  Mild right atrial enlargement.  10. Normal right ventricular size and systolic function.  11. Small posterior pericardial effusion, without evidence of hemodynamic      compromise.  12. Consider transesophageal echocardiogram, if  clinically indicated, for      enhanced visualization of the mitral stenosis and possible vegetation.       AB/MEDQ  D:  11/30/2004  T:  12/01/2004  Job:  [id number]   cc:   Leonides Grills, M.D.  [address] [place] [address]  Fax: [phone]

## 2010-10-23 NOTE — Procedures (Signed)
NAMESUDE, BYERS NO.:  [id number]   MEDICAL RECORD NO.:  [id number]          PATIENT TYPE:  OUT   LOCATION:  RAD                           FACILITY:  [facility]   PHYSICIAN:  Jacqulyn Ducking, M.D. [facility] OF BIRTH:  [birth year]   DATE OF PROCEDURE:  09/17/2005  DATE OF DISCHARGE:                                  ECHOCARDIOGRAM   REFERRING PHYSICIAN:  Drs. Orson Ape and Hardin   CLINICAL DATA:  A 77 year old woman with hypertension and mitral valve  disease.   M-MODE:  Aorta 2.4, left atrium 5.3, septum 1.3, posterior wall 1.2, LV  diastole 4.2, LV systole 2.1.   IMPRESSION:  1.  Technically adequate echocardiographic study.  2.  Moderate left atrial enlargement; normal right atrium and right      ventricle.  3.  Mild sclerosis of a trileaflet aortic valve.  4.  Mild mitral valve thickening; severe annular calcification with mild      stenosis and mild regurgitation.  5.  Normal tricuspid valve; mild regurgitation; mild elevation in estimated      right ventricular systolic pressure.  6.  Normal left ventricular size; borderline hypertrophy; normal systolic      function.  7.  Minimal pericardial effusion versus epicardial fat.  8.  Normal inferior vena cava.  9.  Comparison with prior study November 30, 2004:  Aortic insufficiency no      longer identified; no vegetation or possible vegetation seen.      Jacqulyn Ducking, M.D. [facility]  Electronically Signed     RR/MEDQ  D:  09/18/2005  T:  09/18/2005  Job:  [id number]

## 2010-10-23 NOTE — Procedures (Signed)
[name], AMSLER NO.:  [id number]   MEDICAL RECORD NO.:  [id number]          PATIENT TYPE:  INP   LOCATION:  A203                          FACILITY:  [facility]   PHYSICIAN:  Scarlett Presto, M.D.   DATE OF BIRTH:  15-Nov-1926   DATE OF PROCEDURE:  12/02/2004  DATE OF DISCHARGE:                                  ECHOCARDIOGRAM   PRIMARY:  Dr. Dillard Cannon.   TAPE NUMBER:  [id number].   TAPE COUNT:  [id number]   This is a 77 year old woman with multiple embolic strokes with an abnormal  transthoracic echocardiogram.  Sedation:  We used 2 mg of Versed and 25 mcg  of fentanyl in graduated doses to attain adequate conscious sedation.  She  got topical Hurricaine spray to her pharynx.   DETAILS OF THE PROCEDURE:  The patient was brought to the same day surgery  area where she was placed in a left lateral decubitus position.  Continuous  EKG pulse oximetry and blood pressure monitoring was obtained.  The patient  was given conscious sedation to attain adequate level of sedation and then  the Olympus probe was introduced into the oropharynx.  The patient  successfully swallowed the probe into the transgastric view where images  were obtained.  The probe was then moved into the mid epigastric view were  images were again obtained and then the probe was used to image the  ascending aortic arch and descending aorta.  The patient tolerated the  procedure well and was recovered in the recovery area and then moved back to  the ward without complication.   RESULTS:  The left ventricle is normal size with moderate concentric left  ventricular hypertrophy, normal ejection fraction.  Estimated ejection  fraction 60-65%.  No obvious wall motion abnormalities were seen.  No  evidence of ventricular septal defect was seen.   The right ventricle is normal size with normal systolic function.   Both atria were enlarged.  The left is markedly enlarged.  There is a patent  foramen ovale  with left-to-right flow.  No evidence of right-to-left flow.  There is spontaneous echocardiogram contrast seen in the left atrium.  There  was no evidence of left atrial clot or left atrial appendage clot.   The aortic valve is sclerotic.  There is trace insufficiency.  No stenosis  is seen.  It appears to be trileaflet, tricommissural.   The mitral valve is markedly thickened along its annulus with marked annular  calcification.  The leaflet tips themselves are thickened.  There are  several areas of nodular irregularity along the mitral annulus, but no  obvious vegetation was seen on the mitral valve after vigorous  interrogation.  There is at least moderate mitral stenosis with a velocity  across the valve of 2 m/second.  There is mild mitral regurgitation seen.   The tricuspid valve is morphologically unremarkable and has mild  regurgitation seen.  There was no evidence of vegetation.   The pulmonic valve was not well interrogated, but appeared to be  morphologically unremarkable with no evidence of vegetation.   There  is no pericardial effusion.   ASSESSMENT:  This appears to be a cardioembolic source for the thrombus with  the mitral stenosis causing enlarged left atrial size and spontaneous  echocardiogram contrast in the left atrium which is an excellent nidus for  cardioembolic thromboembolus.  Therefore, my recommendation is that the  patient undergo lifelong Coumadin therapy.  I do not believe the mitral  valve requires surgical resection and replacement as the left ventricular  systolic function appears to be normal and the patient is otherwise  asymptomatic and I would manage her medically at this point for her mitral  stenosis.       JH/MEDQ  D:  12/02/2004  T:  12/02/2004  Job:  [id number]

## 2010-10-23 NOTE — Assessment & Plan Note (Signed)
[facility] OFFICE NOTE   NAME:White, Meghan ZEIEN                    MRN:          [id number]  DATE:12/24/2005                            DOB:          Mar 29, 1927    [name] returns today for routine follow up.  This is a lady I follow  who has rheumatic heart disease with mitral stenosis which is mild on a  recent echo done in April of 2007.  She has normal LV systolic function,  left ventricular hypertrophy, hypertension, history of a CVA probably  secondary to the mitral stenosis and now she is on Coumadin.  She has done  well, she has no problems with bleeding.   She is on the following medications:  1.  K-Dur 20 once a day.  2.  Furosemide 40 once a day.  3.  Warfarin as directed by our Coumadin clinic.  4.  Atenolol 50 once a day.  5.  Norvasc 5 mg once a day.   She does report a little bit of swelling in her feet, which is actually  quite minimal, probably secondary to the Norvasc.   Today on physical exam, she is 198 pounds, her blood pressure is 122/72, her  pulse is 64.  CHEST:  Clear.  She has no jugular venous distention or carotid bruits.  CARDIOVASCULAR EXAM:  Regular.  First heart sound is kind of soft.  I do  hear a soft diastolic murmur best heard at the upper right sternal border,  do not hear any systolic murmur.  LOWER EXTREMITIES:  Do have trace edema.   ASSESSMENT:  Rheumatic heart disease.  Recent echocardiogram is reassuring.  Left atrial enlargement is concerning.  She does not appear to be in atrial  fibrillation but she is at high risk for that.  Certainly with her previous  stroke, one would think that this is of issue.  However, she does not appear  to meet either percutaneous or surgical criteria for a mitral valve and will  follow her for that.  She does have SBE prophylaxis and dental care has been  emphasized to her although she is not compliant with that.   With regard to  her high blood pressure, this is well controlled on two medications.  She  probably benefits from continuing those.  I think her edema in her feet are  really very minimal and not of any concern.  Beyond that, Dr. Orson Ape  manages her for primary prophylaxis for coronary disease.                                   Cristopher Estimable. Lattie Haw, MD, [facility]   RMR/MedQ  DD:  12/24/2005  DT:  12/24/2005  Job #:  [id number]   cc:   Leonides Grills, MD

## 2010-10-23 NOTE — Consult Note (Signed)
NAMEILYANNA, BIBER NO.:  [id number]   MEDICAL RECORD NO.:  [id number]          PATIENT TYPE:  INP   LOCATION:  A203                          FACILITY:  [facility]   PHYSICIAN:  Scarlett Presto, M.D.   DATE OF BIRTH:  03-14-1927   DATE OF CONSULTATION:  12/02/2004  DATE OF DISCHARGE:                                   CONSULTATION   HISTORY OF PRESENT ILLNESS:  Mrs. Sesto is a 77 year old woman who was  admitted to [facility] on November 28, 2004 with sudden onset of left  eye blindness.  She presented about 9 hours into the onset.  The stroke team  at [facility] was consulted and recommended heparinization without bolus.  An echocardiogram was done 2 days later, which revealed a possible  vegetation on the mitral valve.  Neurology saw the patient yesterday and  recommended an evaluation with a transesophageal echocardiogram, after an  echocardiogram was performed and interpreted by me colleague, Dr. Odette Fraction.   PAST MEDICAL HISTORY:  The patient has no significant past medical history,  other than hypertension.   SOCIAL HISTORY:  She lives in [place]  She is a Banker.  She  is married.  She does not smoke, drink, or use illicit drugs.   FAMILY HISTORY:  Her mother's health history is unknown.  Her father died at  age 67 of a myocardial infarction.  She has 5 brothers, 1 of whom died of a  myocardial infarction, and 2 sisters, 1 of whom died of a myocardial  infarction; the other died of complications of diabetes.   MEDICATIONS PRIOR TO ADMISSION:  Her only medications prior to admission  were -  1.  Atenolol 50 mg daily.  2.  Zestril at an unknown dose.   CURRENT MEDICATIONS:  She is currently on -  1.  Aspirin 81 mg daily.  2.  Prinivil at 20 mg once a day.  3.  Lasix 40 mg daily.  4.  Xopenex 1.25 mg twice a day.  5.  Atenolol 50 mg a day.  6.  K-Dur 20 mEq a day.  7.  Coumadin 10 mg once a day.   REVIEW OF SYSTEMS:  She  denies any fever, chills, night sweats, weight  change, or adenopathy.  She denies any headaches, sinus tenderness, nasal  discharge, voice changes, or vertigo, photophobia.  Her visual changes are  previously documented.  She denies any skin rashes or lesions.  She denies  any chest pain, shortness of breath, dyspnea on exertion, orthopnea, PND,  edema, presyncope, syncope, claudication, coughing, or wheezing.  She denies  any urinary frequency, urgency, or dysuria.  No hematuria or nocturia.  She  denies any weakness, numbness, or mood disturbances.  No depression or  anxiety.  She denies any myalgias or arthralgias.  No joint swelling.  She  denies any nausea, vomiting, diarrhea, bright red blood per rectum, melena,  hematochezia, odynophagia or dysphagia, GERD symptoms.  No polyuria or  polydipsia.  The remainder of her review of systems is negative.   PHYSICAL EXAMINATION:  GENERAL:  She is a well-developed, well-nourished  African-American female in no apparent distress who is alert and oriented  x4.  A very pleasant historian.  VITAL SIGNS:  Her temperature is 96.5, pulse 50, respirations 18, blood  pressure 144/70.  She is saturating 96% on room air.  HEENT:  Significant for severe decreased vision in her left eye where she  can see shapes and movement, but cannot discern much beyond that, and  probably has about 20/200 to 20/300 vision in that eye.  The right eye  appears to function normally.  The remainder of her HEENT exam is really  unremarkable.  I did not look into her eyes or examine her fundi.  NECK:  Supple.  There is no jugular venous distention or carotid bruits.  CHEST:  Clear to auscultation bilaterally.  CARDIOVASCULAR:  Regular.  Her point of maximum impulse is not palpable.  She has no lifts or thrills.  First and second heart sounds are distant, but  appear normal.  She has a 2/6 holosystolic murmur.  No diastolic murmur is  noted.  ABDOMEN:  Soft, obese,  nontender.  Normoactive bowel sounds.  EXTREMITIES:  Her lower extremities are without significant clubbing,  cyanosis, or edema.  GU/RECTAL:  Deferred.  MUSCULOSKELETAL:  Unremarkable.  NEUROLOGIC:  Not performed, due to a detailed neurologic exam documented by  the neurologist.  BREASTS:  We did not do a breast exam.   LABORATORIES AND DIAGNOSTIC STUDIES:  MRI of her brain shows atrophy with  chronic microvascular ischemic changes, scattered areas of acute infarction  involving the right genu of the corpus callosum, the left caudate lobe, and  the right thalamus.  Carotid Doppler showed mild plaquing with no  significant stenosis.  Chest x-ray, interestingly, shows some interstitial  edema.  Electrocardiogram shows sinus bradycardia at a rate of 49 with  normal intervals, normal axis, and no ischemic ST-T wave changes.  Her  echocardiogram shows marked left atrial enlargement with normal LV systolic  function, mild LVH, mild right atrial enlargement, mild aortic  insufficiency, heavy mitral annular calcification with a question of a mass  or vegetation on the posterior mitral leaflet.  Lipids - her total  cholesterol is 157, triglycerides 136, HDL 45, LDL 85.  Her total protein is  7.3, albumin 4.0.  Her erythrocyte sedimentation rate is 25.  Her vitamin B-  12 is 559.  Her homocystine level is 15.2.  INR is 1.1, PTT of 112 (that is  on heparin).  Her liver function studies are within normal limits, with the  exception of a mildly elevated ALT at 63.  Her white blood cell count is  7.2, hemoglobin and hematocrit of 12 and 34, platelet count of 213.  Sodium  142, potassium 3.8, chloride 105, bicarbonate 28, BUN 21, creatinine 1.3,  and her blood sugar is 97.   This is a woman who has multiple areas of acute embolic phenomenon in her  brain on MRI with a primary symptoms of left eye blindness with some significant residual who has question of a mitral valve vegetation versus  mass on  her transthoracic echo, and her x-ray shows some question of  congestive heart failure on chest x-ray, although a repeat is pending.  She  also has hypertension, which is well controlled.  So we are going to move  forward with a transesophageal echocardiogram at the request of the  neurologist.  I think she probably deserves blood  cultures, if there is a question  of endocarditis in this lady, although  without a history of fevers, this is unlikely, and there is no intercedent  dental work that is known.  She does, however, have a murmur, so it is  worthwhile, I think, pursuing this, and will continue to follow her.       JH/MEDQ  D:  12/02/2004  T:  12/02/2004  Job:  [id number]   cc:   Leonides Grills, M.D.  [address] [place] [id number]  Fax: [phone]

## 2010-10-23 NOTE — Consult Note (Signed)
[name], [name]             ACCOUNT NO.:  [id number]   MEDICAL RECORD NO.:  [id number]          PATIENT TYPE:  INP   LOCATION:  A203                          FACILITY:  [facility]   PHYSICIAN:  Kofi A. Merlene Laughter, M.D. DATE OF BIRTH:  1927/01/04   DATE OF CONSULTATION:  DATE OF DISCHARGE:                                   CONSULTATION   ASSESSMENT:  1.  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  2.  Hypertension.   RECOMMENDATIONS:  1.  It looks like she has had relatively complete workup pending. I am going      to add a sed rate, a lipid profile and homocystine level.  2.  If the patient's carotid Doppler's are unremarkable, I would simply      suggest placing her on antiplatelet agents such as aspirin.  3.  I think I would put her on a Statin regardless of her lipid profile      which can reduce her risk of further ischemic events with this.   HISTORY:  A 77 year old African-American female who has a history of well  controlled hypertension. She presented with the acute onset of loss of  vision on the left. No other associated symptoms were reported. The patient  was seen and evaluated at the emergency room and was placed on heparin. She  was not taking any form of antiplatelet agents premorbidly.   PAST MEDICAL HISTORY:  Hypertension. She has had some bronchitis type  symptoms recently.   ADMISSION MEDICATIONS:  Zestril, atenolol.   SOCIAL HISTORY:  Denies smoking, tobacco or illicit drug use.   REVIEW OF SYMPTOMS:  As stated in the history of present illness. No other  stroke-like symptoms. No focal numbness, weakness, ataxia or speech  impairment. No chest pain. The patient apparently has had some mild  headaches today.   PHYSICAL EXAMINATION:  GENERAL:  Shows a slightly over weight lady in no  acute distress.  VITAL SIGNS:  Temperature 98.3, pulse 48, respirations 18,  blood pressure  140/76.  HEENT:  Neck supple.  EXTREMITIES:  No significant edema.  ABDOMEN:  Somewhat obese, soft.  NEUROLOGIC:  Mentation, she is awake and alert. Speech, language and  cognition are intact. Cranial nerves evaluation shows both pupils to be  about 5 mm. The right is briskly reactive, the left is very sluggish to  reactive. There is clearly an afferent pupillary defect of that side. She  has only mild light perception on the left. Visual fields are intact on the  right. Extraocular movements are intact. Facial muscles are symmetric,  tongue is midline, uvula midline, shoulder shrug normal. Motor examination  shows normal tone, bulk and strength. There is no pronator drift.  Coordination is intact, reflexes are present  normal and plantar reflexes  flexor. Sensory examination normal to temperature and light touch.   CT scan of the brain shows small vessel ischemic changes otherwise no acute  process.   WBC 7, hemoglobin 11.9, platelet count of 213. INR  1.1 and electrolytes are  unremarkable.   Thanks for this consultation.       KAD/MEDQ  D:  11/30/2004  T:  11/30/2004  Job:  [id number]

## 2010-11-11 ENCOUNTER — Encounter: Payer: Self-pay | Admitting: Physician Assistant

## 2010-11-11 ENCOUNTER — Ambulatory Visit (INDEPENDENT_AMBULATORY_CARE_PROVIDER_SITE_OTHER): Payer: Medicare Other | Admitting: Physician Assistant

## 2010-11-11 DIAGNOSIS — R42 Dizziness and giddiness: Secondary | ICD-10-CM

## 2010-11-11 DIAGNOSIS — I4891 Unspecified atrial fibrillation: Secondary | ICD-10-CM

## 2010-11-11 MED ORDER — MECLIZINE HCL 25 MG PO TABS: 25.0000 mg | ORAL_TABLET | Freq: Three times a day (TID) | ORAL | Status: DC | PRN

## 2010-11-11 NOTE — Assessment & Plan Note (Signed)
The patient had 3 day history of dizziness when she turns her head or looks up at the ceiling. She does have a drop in her blood pressure from 0000000 systolic down to [id number] from lying to standing but was not dizzy with this. I believe most her symptoms are coming from in her ear which she has had before. We will prescribe Antivert 25 mg t.i.d.She will follow-up in our office next week with Dr. Lattie Haw or Ann Maki.P.

## 2010-11-11 NOTE — Patient Instructions (Signed)
**Note De-Identified  Obfuscation** Your physician has recommended you make the following change in your medication: Resume Antivert 25mg  three times daily as needed for dizziness  Your physician has recommended you make the following change in your medication: 2 weeks

## 2010-11-11 NOTE — Assessment & Plan Note (Signed)
Patient's atrial fibrillation is controlled. Her pacemaker was checked in April and stable.

## 2010-11-11 NOTE — Progress Notes (Signed)
HPI: This is a very pleasant 77 year old [place] American female patient who presents today for evaluation of dizziness. Since Sunday she complains of dizziness if she looks up at the ceiling or turns her head quickly. It also occurs when she is trying to lay down. She denies dizziness when she changes positions from lying to sitting to standing. She has had this before and was given meclizine. She tried 3 these tablets which did help.  The patient has a history of paroxysmal edge of her ablation and a pacemaker. Her pacer was checked in April 2004 by Dr. Lovena Le. She denies any chest pain, palpitations, presyncope.  No Known Allergies  Current Outpatient Prescriptions on File Prior to Visit  Medication Sig Dispense Refill  . albuterol (PROVENTIL) (2.5 MG/3ML) 0.083% nebulizer solution Take 2.5 mg by nebulization every 6 (six) hours as needed.        Marland Kitchen allopurinol (ZYLOPRIM) 100 MG tablet Take 100 mg by mouth daily.        . colchicine 0.6 MG tablet Take 0.6 mg by mouth daily.        Marland Kitchen diltiazem (CARDIZEM CD) 360 MG 24 hr capsule Take 360 mg by mouth daily.        Marland Kitchen HYDROcodone-acetaminophen (NORCO) 5-325 MG per tablet Take 1 tablet by mouth every 6 (six) hours as needed.  42 tablet  1  . ranitidine (ZANTAC) 150 MG tablet Take 150 mg by mouth 2 (two) times daily.        . simvastatin (ZOCOR) 40 MG tablet Take 40 mg by mouth at bedtime.        Marland Kitchen warfarin (COUMADIN) 5 MG tablet Take by mouth as directed.        Marland Kitchen DISCONTD: meclizine (ANTIVERT) 25 MG tablet Take 25 mg by mouth 3 (three) times daily as needed.        Marland Kitchen DISCONTD: PRAVACHOL 80 MG tablet TAKE (1) TABLET BY MOUTH AT BEDTIME.  30 each  3    Past Medical History  Diagnosis Date  . Tachycardia-bradycardia syndrome     with pauses and syncope;PPM implantation 10/2008,negative stress nuclear study 03/2005  . Atrial fibrillation     persistant  . Mitral annular calcification   . Hypertension     heart disease diastolic dysfunction ;mild  pulmonary edema in 2006;responded to diurectics  ventricular hypertrophy  . Chronic renal insufficiency     baseline creatine 1.4  . Hyperlipidemia   . Angioedema   . Anemia     H/H of 11.3/36.7 in 12/2008 ;normal MCV  . Gout   . DJD (degenerative joint disease)     knees ;right TKA 04/2009  . 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 initially trated with asa but subsequently changed to coumadin  when found to have rheumatic mitral valve disease carotid duplex mild plaque in 3/*2010    Past Surgical History  Procedure Date  . Insert / replace / remove pacemaker     [employer]ude DUAL chamber pacemaker 11/06/2008  . Right tka 04/2009    No family history on file.  History   Social History  . Marital Status: Married    Spouse Name: N/A    Number of Children: N/A  . Years of Education: N/A   Occupational History  . Not on file.   Social History Main Topics  . Smoking status: Never Smoker   . Smokeless tobacco: Never  Used  . Alcohol Use: No  . Drug Use: No  . Sexually Active: Not on file   Other Topics Concern  . Not on file   Social History Narrative  . No narrative on file    ROS: See HPI Eyes: Negative Ears:Negative for hearing loss, tinnitus Cardiovascular: Negative for chest pain, palpitations,irregular heartbeat, dyspnea, dyspnea on exertion, near-syncope, orthopnea, paroxysmal nocturnal dyspnia and syncope,edema, claudication, cyanosis,.  Respiratory:   Negative for cough, hemoptysis, shortness of breath, sleep disturbances due to breathing, sputum production and wheezing.   Endocrine: Negative for cold intolerance and heat intolerance.  Hematologic/Lymphatic: Negative for adenopathy and bleeding problem. Does not bruise/bleed easily.  Musculoskeletal: Negative.   Gastrointestinal: Negative for nausea, vomiting, reflux, abdominal pain, diarrhea,  constipation.   Genitourinary: Negative for bladder incontinence, dysuria, flank pain, frequency, hematuria, hesitancy, nocturia and urgency.  Neurological: Dizzy when she turns her head or looks up.  Allergic/Immunologic: Negative for environmental allergies.   PHYSICAL EXAM Well-nournished, in no acute distress. Ears; tympanic membranes are clear without fluid or signs of infection Neck: No JVD, HJR, Bruit, or thyroid enlargement Lungs: No tachypnea, clear without wheezing, rales, or rhonchi Cardiovascular: irregular irregular, PMI not displaced, with a 2/6 systolic murmur at the apex, no bruit, thrill, or heave. Abdomen: BS normal. Soft without organomegaly, masses, lesions or tenderness. Extremities: without cyanosis, clubbing or edema. Good distal pulses bilateral SKin: Warm, no lesions or rashes  Musculoskeletal: No deformities Neuro: no focal signs  BP 135/78  Pulse 88  Ht 5\' 5"  (1.651 m)  Wt 182 lb (82.555 kg)  BMI 30.29 kg/m2  EKG:  ASSESSMENT AND PLAN:

## 2010-11-17 ENCOUNTER — Ambulatory Visit (INDEPENDENT_AMBULATORY_CARE_PROVIDER_SITE_OTHER): Payer: Medicare Other | Admitting: *Deleted

## 2010-11-17 ENCOUNTER — Ambulatory Visit (INDEPENDENT_AMBULATORY_CARE_PROVIDER_SITE_OTHER): Payer: Medicare Other | Admitting: Cardiology

## 2010-11-17 ENCOUNTER — Encounter: Payer: Self-pay | Admitting: Cardiology

## 2010-11-17 DIAGNOSIS — I639 Cerebral infarction, unspecified: Secondary | ICD-10-CM | POA: Insufficient documentation

## 2010-11-17 DIAGNOSIS — I6350 Cerebral infarction due to unspecified occlusion or stenosis of unspecified cerebral artery: Secondary | ICD-10-CM

## 2010-11-17 DIAGNOSIS — E785 Hyperlipidemia, unspecified: Secondary | ICD-10-CM

## 2010-11-17 DIAGNOSIS — I495 Sick sinus syndrome: Secondary | ICD-10-CM

## 2010-11-17 DIAGNOSIS — I4891 Unspecified atrial fibrillation: Secondary | ICD-10-CM

## 2010-11-17 DIAGNOSIS — I10 Essential (primary) hypertension: Secondary | ICD-10-CM

## 2010-11-17 DIAGNOSIS — Z950 Presence of cardiac pacemaker: Secondary | ICD-10-CM

## 2010-11-17 DIAGNOSIS — M1990 Unspecified osteoarthritis, unspecified site: Secondary | ICD-10-CM

## 2010-11-17 DIAGNOSIS — R42 Dizziness and giddiness: Secondary | ICD-10-CM

## 2010-11-17 NOTE — Patient Instructions (Signed)
Your physician recommends that you schedule a follow-up appointment in: 07/2011 STOOL CARDS- PLEASE FOLLOW DIRECTIONS IN PACKET

## 2010-11-17 NOTE — Patient Instructions (Signed)
INR 2.1 TODAY INCREASE DOSE TO 0.5 TABLET ON TUESDAYS, THURSDAYS AND SATURDAYS AND 1 TABLET ALL OTHER DAYS RECHECK INR 12/17/10

## 2010-11-17 NOTE — Assessment & Plan Note (Signed)
Blood pressure control is good; current medications will be continued.

## 2010-11-17 NOTE — Progress Notes (Signed)
HPI : [name] returns to the office after a one week interval for reassessment of dizziness.  By her description, this is classic vertigo.  Symptoms have resolved under treatment with meclizine 25 mg t.i.d., which she continues to take.  She is experiencing no apparent adverse effects of medication.  She denies dyspnea, orthopnea, PND or chest discomfort.  Current Outpatient Prescriptions on File Prior to Visit  Medication Sig Dispense Refill  . albuterol (PROVENTIL) (2.5 MG/3ML) 0.083% nebulizer solution Take 2.5 mg by nebulization every 6 (six) hours as needed.        Marland Kitchen allopurinol (ZYLOPRIM) 100 MG tablet Take 100 mg by mouth daily.        . colchicine 0.6 MG tablet Take 0.6 mg by mouth daily.        Marland Kitchen diltiazem (CARDIZEM CD) 360 MG 24 hr capsule Take 360 mg by mouth daily.        Marland Kitchen HYDROcodone-acetaminophen (NORCO) 5-325 MG per tablet Take 1 tablet by mouth every 6 (six) hours as needed.  42 tablet  1  . meclizine (ANTIVERT) 25 MG tablet Take 1 tablet (25 mg total) by mouth 3 (three) times daily as needed.  90 tablet  3  . ranitidine (ZANTAC) 150 MG tablet Take 150 mg by mouth 2 (two) times daily.        . simvastatin (ZOCOR) 40 MG tablet Take 40 mg by mouth at bedtime.        Marland Kitchen warfarin (COUMADIN) 5 MG tablet Take by mouth as directed.           No Known Allergies    Past medical history, social history, and family history reviewed and updated.  ROS: See history of present illness.  PHYSICAL EXAM: BP 136/72  Pulse 85  Ht 5\' 2"  (1.575 m)  Wt 186 lb (84.369 kg)  BMI 34.02 kg/m2  SpO2 95% the; weight increased 4 pounds over the past week General-Well developed; no acute distress Body habitus-overweight Neck-No JVD; no carotid bruits Lungs-clear lung fields; resonant to percussion Cardiovascular-normal PMI; normal S1 and S2; modest basilar systolic murmur; irregular rhythm Abdomen-normal bowel sounds; soft and non-tender without masses or organomegaly Musculoskeletal-No  deformities, no cyanosis or clubbing Neurologic-Normal cranial nerves; symmetric strength and tone; EOMs full without nystagmus Skin-Warm, no significant lesions Extremities-distal pulses intact; 1/2+ edema  Laboratory: INR is therapeutic today with a value of 2.1.  ASSESSMENT AND PLAN:

## 2010-11-17 NOTE — Assessment & Plan Note (Signed)
Symptoms and response to treatment are highly suggestive of vertigo related to acute vestibulitis.  She was asked to stop continuous meclizine treatment and to use it on a p.r.n basis.  I suggested a trial of medication in the future should symptoms recur.  She will contact Dr. Orson Ape if this proves unsuccessful.  I will see her again in 8 months.

## 2010-11-18 ENCOUNTER — Encounter: Payer: Medicare Other | Admitting: *Deleted

## 2010-11-23 ENCOUNTER — Ambulatory Visit: Payer: Medicare Other | Admitting: Adult Health

## 2010-11-24 ENCOUNTER — Ambulatory Visit: Payer: Medicare Other | Admitting: Cardiology

## 2010-12-16 ENCOUNTER — Ambulatory Visit (INDEPENDENT_AMBULATORY_CARE_PROVIDER_SITE_OTHER): Payer: Medicare Other | Admitting: *Deleted

## 2010-12-16 DIAGNOSIS — I4891 Unspecified atrial fibrillation: Secondary | ICD-10-CM

## 2010-12-16 DIAGNOSIS — Z7901 Long term (current) use of anticoagulants: Secondary | ICD-10-CM | POA: Insufficient documentation

## 2010-12-24 ENCOUNTER — Other Ambulatory Visit: Payer: Self-pay | Admitting: Internal Medicine

## 2010-12-24 ENCOUNTER — Ambulatory Visit (INDEPENDENT_AMBULATORY_CARE_PROVIDER_SITE_OTHER): Payer: Medicare Other | Admitting: *Deleted

## 2010-12-24 DIAGNOSIS — I495 Sick sinus syndrome: Secondary | ICD-10-CM

## 2010-12-30 NOTE — Progress Notes (Signed)
Pacer remote check  

## 2011-01-05 ENCOUNTER — Other Ambulatory Visit: Payer: Self-pay | Admitting: *Deleted

## 2011-01-05 DIAGNOSIS — G8929 Other chronic pain: Secondary | ICD-10-CM

## 2011-01-05 MED ORDER — HYDROCODONE-ACETAMINOPHEN 5-325 MG PO TABS: 1.0000 | ORAL_TABLET | Freq: Four times a day (QID) | ORAL | Status: DC | PRN

## 2011-01-12 ENCOUNTER — Encounter: Payer: Self-pay | Admitting: *Deleted

## 2011-01-13 ENCOUNTER — Encounter: Payer: Medicare Other | Admitting: *Deleted

## 2011-01-14 ENCOUNTER — Ambulatory Visit (INDEPENDENT_AMBULATORY_CARE_PROVIDER_SITE_OTHER): Payer: Medicare Other | Admitting: *Deleted

## 2011-01-14 DIAGNOSIS — I6350 Cerebral infarction due to unspecified occlusion or stenosis of unspecified cerebral artery: Secondary | ICD-10-CM

## 2011-01-14 DIAGNOSIS — I4891 Unspecified atrial fibrillation: Secondary | ICD-10-CM

## 2011-01-14 LAB — POCT INR: INR: 3.5

## 2011-01-19 ENCOUNTER — Other Ambulatory Visit: Payer: Self-pay | Admitting: Cardiology

## 2011-02-01 ENCOUNTER — Ambulatory Visit (INDEPENDENT_AMBULATORY_CARE_PROVIDER_SITE_OTHER): Payer: Medicare Other | Admitting: *Deleted

## 2011-02-01 DIAGNOSIS — I4891 Unspecified atrial fibrillation: Secondary | ICD-10-CM

## 2011-02-01 DIAGNOSIS — I6350 Cerebral infarction due to unspecified occlusion or stenosis of unspecified cerebral artery: Secondary | ICD-10-CM

## 2011-02-11 ENCOUNTER — Ambulatory Visit (INDEPENDENT_AMBULATORY_CARE_PROVIDER_SITE_OTHER): Payer: Medicare Other | Admitting: *Deleted

## 2011-02-11 DIAGNOSIS — I6350 Cerebral infarction due to unspecified occlusion or stenosis of unspecified cerebral artery: Secondary | ICD-10-CM

## 2011-02-11 DIAGNOSIS — I4891 Unspecified atrial fibrillation: Secondary | ICD-10-CM

## 2011-03-04 ENCOUNTER — Ambulatory Visit (INDEPENDENT_AMBULATORY_CARE_PROVIDER_SITE_OTHER): Payer: Medicare Other | Admitting: *Deleted

## 2011-03-04 DIAGNOSIS — I4891 Unspecified atrial fibrillation: Secondary | ICD-10-CM

## 2011-03-04 DIAGNOSIS — I6350 Cerebral infarction due to unspecified occlusion or stenosis of unspecified cerebral artery: Secondary | ICD-10-CM

## 2011-03-04 LAB — POCT CARDIAC MARKERS
Myoglobin, poc: 173
Operator id: 189501
Operator id: 284061
Troponin i, poc: <0.05

## 2011-03-04 LAB — BASIC METABOLIC PANEL
BUN: 28 — ABNORMAL HIGH
Creatinine, Ser: 2.11 — ABNORMAL HIGH
GFR calc non Af Amer: 23 — ABNORMAL LOW
Potassium: 4.2

## 2011-03-04 LAB — DIFFERENTIAL
Lymphocytes Relative: 33
Lymphs Abs: 1.9
Neutro Abs: 3
Neutrophils Relative %: 51

## 2011-03-04 LAB — CBC
Platelets: 210
WBC: 5.9

## 2011-03-04 LAB — POCT INR: INR: 1.8

## 2011-03-04 LAB — B-NATRIURETIC PEPTIDE (CONVERTED LAB): Pro B Natriuretic peptide (BNP): 225 — ABNORMAL HIGH

## 2011-03-08 LAB — BASIC METABOLIC PANEL
BUN: 23
Chloride: 104
Glucose, Bld: 127 — ABNORMAL HIGH
Potassium: 4.5
Sodium: 139

## 2011-03-08 LAB — DIFFERENTIAL
Eosinophils Absolute: 0.2
Eosinophils Relative: 3
Lymphs Abs: 1.9
Monocytes Absolute: 0.6

## 2011-03-08 LAB — URINALYSIS, ROUTINE W REFLEX MICROSCOPIC
Bilirubin Urine: NEGATIVE
Glucose, UA: NEGATIVE
Ketones, ur: NEGATIVE
pH: 6

## 2011-03-08 LAB — CBC
HCT: 40.1
Hemoglobin: 13.2
MCV: 88.5
Platelets: 211
WBC: 6.2

## 2011-03-08 LAB — POCT CARDIAC MARKERS: Troponin i, poc: <0.05

## 2011-03-08 LAB — LIPASE, BLOOD: Lipase: 41

## 2011-03-10 ENCOUNTER — Other Ambulatory Visit: Payer: Self-pay | Admitting: Cardiology

## 2011-03-11 ENCOUNTER — Ambulatory Visit (INDEPENDENT_AMBULATORY_CARE_PROVIDER_SITE_OTHER): Payer: Medicare Other | Admitting: *Deleted

## 2011-03-11 ENCOUNTER — Encounter: Payer: Self-pay | Admitting: Internal Medicine

## 2011-03-11 ENCOUNTER — Other Ambulatory Visit: Payer: Self-pay | Admitting: Internal Medicine

## 2011-03-11 DIAGNOSIS — I495 Sick sinus syndrome: Secondary | ICD-10-CM

## 2011-03-11 LAB — PACEMAKER DEVICE OBSERVATION
AL IMPEDENCE PM: 430 Ohm
ATRIAL PACING PM: 1
BAMS-0001: 150 {beats}/min
BAMS-0003: 70 {beats}/min
BRDY-0002RV: 60 {beats}/min
BRDY-0003RV: 120 {beats}/min
BRDY-0004RV: 120 {beats}/min
DEVICE MODEL PM: 2323973
RV LEAD AMPLITUDE: 12 mv
RV LEAD THRESHOLD: 0.875 V

## 2011-03-11 NOTE — Progress Notes (Signed)
PPM check 

## 2011-03-22 ENCOUNTER — Encounter (HOSPITAL_COMMUNITY)
Admission: RE | Admit: 2011-03-22 | Discharge: 2011-03-22 | Disposition: A | Discharge status: Home / Self Care | Payer: Medicare Other | Source: Ambulatory Visit | Attending: Ophthalmology | Admitting: Ophthalmology

## 2011-03-22 ENCOUNTER — Ambulatory Visit (INDEPENDENT_AMBULATORY_CARE_PROVIDER_SITE_OTHER): Payer: Medicare Other | Admitting: *Deleted

## 2011-03-22 ENCOUNTER — Encounter (HOSPITAL_COMMUNITY): Payer: Self-pay

## 2011-03-22 ENCOUNTER — Other Ambulatory Visit: Payer: Self-pay

## 2011-03-22 DIAGNOSIS — I4891 Unspecified atrial fibrillation: Secondary | ICD-10-CM

## 2011-03-22 DIAGNOSIS — I6350 Cerebral infarction due to unspecified occlusion or stenosis of unspecified cerebral artery: Secondary | ICD-10-CM

## 2011-03-22 HISTORY — DX: Encounter for other specified aftercare: Z51.89

## 2011-03-22 HISTORY — DX: Reserved for inherently not codable concepts without codable children: IMO0001

## 2011-03-22 HISTORY — DX: Shortness of breath: R06.02

## 2011-03-22 LAB — BASIC METABOLIC PANEL
CO2: 30 mEq/L (ref 19–32)
Calcium: 9.9 mg/dL (ref 8.4–10.5)
GFR calc Af Amer: 60 mL/min — ABNORMAL LOW (ref >90)
Sodium: 142 mEq/L (ref 135–145)

## 2011-03-22 LAB — CBC
MCH: 30 pg (ref 26.0–34.0)
Platelets: 178 10*3/uL (ref 150–400)
RBC: 4.76 MIL/uL (ref 3.87–5.11)
RDW: 13.2 % (ref 11.5–15.5)
WBC: 6.9 10*3/uL (ref 4.0–10.5)

## 2011-03-22 LAB — POCT INR: INR: 3

## 2011-03-22 NOTE — Patient Instructions (Addendum)
[place]  03/22/2011   Your procedure is scheduled on:  03/29/11  Report to [facility] at 0830 AM.  Call this number if you have problems the morning of surgery: [phone]   Remember:   Do not eat food:After Midnight.  Do not drink clear liquids: After Midnight.  Take these medicines the morning of surgery with A SIP OF WATER: zantac, colchicine, cardizem, tiazac   Do not wear jewelry, make-up or nail polish.  Do not wear lotions, powders, or perfumes. You may wear deodorant.  Do not shave 48 hours prior to surgery.  Do not bring valuables to the hospital.  Contacts, dentures or bridgework may not be worn into surgery.  Leave suitcase in the car. After surgery it may be brought to your room.  For patients admitted to the hospital, checkout time is 11:00 AM the day of discharge.   Patients discharged the day of surgery will not be allowed to drive home.  Name and phone number of your driver: family  Special Instructions: N/A   Please read over the following fact sheets that you were given: Pain Booklet and Care and Recovery After Surgery   PATIENT INSTRUCTIONS POST-ANESTHESIA  IMMEDIATELY FOLLOWING SURGERY:  Do not drive or operate machinery for the first twenty four hours after surgery.  Do not make any important decisions for twenty four hours after surgery or while taking narcotic pain medications or sedatives.  If you develop intractable nausea and vomiting or a severe headache please notify your doctor immediately.  FOLLOW-UP:  Please make an appointment with your surgeon as instructed. You do not need to follow up with anesthesia unless specifically instructed to do so.  WOUND CARE INSTRUCTIONS (if applicable):  Keep a dry clean dressing on the anesthesia/puncture wound site if there is drainage.  Once the wound has quit draining you may leave it open to air.  Generally you should leave the bandage intact for twenty four hours unless there is drainage.  If the  epidural site drains for more than 36-48 hours please call the anesthesia department.  QUESTIONS?:  Please feel free to call your physician or the hospital operator if you have any questions, and they will be happy to assist you.     [place] [phone]

## 2011-03-24 ENCOUNTER — Encounter: Payer: Medicare Other | Admitting: *Deleted

## 2011-03-25 ENCOUNTER — Encounter: Payer: Medicare Other | Admitting: *Deleted

## 2011-03-29 ENCOUNTER — Encounter (HOSPITAL_COMMUNITY): Payer: Self-pay | Admitting: Anesthesiology

## 2011-03-29 ENCOUNTER — Ambulatory Visit (HOSPITAL_COMMUNITY): Payer: Medicare Other | Admitting: Anesthesiology

## 2011-03-29 ENCOUNTER — Encounter (HOSPITAL_COMMUNITY)
Admission: RE | Disposition: A | Discharge status: Home / Self Care | Payer: Self-pay | Source: Ambulatory Visit | Attending: Ophthalmology

## 2011-03-29 ENCOUNTER — Encounter (HOSPITAL_COMMUNITY): Payer: Self-pay | Admitting: *Deleted

## 2011-03-29 ENCOUNTER — Ambulatory Visit (HOSPITAL_COMMUNITY)
Admission: RE | Admit: 2011-03-29 | Discharge: 2011-03-29 | Disposition: A | Discharge status: Home / Self Care | Payer: Medicare Other | Source: Ambulatory Visit | Attending: Ophthalmology | Admitting: Ophthalmology

## 2011-03-29 DIAGNOSIS — I10 Essential (primary) hypertension: Secondary | ICD-10-CM | POA: Insufficient documentation

## 2011-03-29 DIAGNOSIS — Z79899 Other long term (current) drug therapy: Secondary | ICD-10-CM | POA: Insufficient documentation

## 2011-03-29 DIAGNOSIS — H2589 Other age-related cataract: Secondary | ICD-10-CM | POA: Insufficient documentation

## 2011-03-29 DIAGNOSIS — E785 Hyperlipidemia, unspecified: Secondary | ICD-10-CM | POA: Insufficient documentation

## 2011-03-29 DIAGNOSIS — Z7901 Long term (current) use of anticoagulants: Secondary | ICD-10-CM | POA: Insufficient documentation

## 2011-03-29 DIAGNOSIS — Z01810 Encounter for preprocedural cardiovascular examination: Secondary | ICD-10-CM | POA: Insufficient documentation

## 2011-03-29 DIAGNOSIS — Z01812 Encounter for preprocedural laboratory examination: Secondary | ICD-10-CM | POA: Insufficient documentation

## 2011-03-29 HISTORY — PX: CATARACT EXTRACTION W/PHACO: SHX586

## 2011-03-29 SURGERY — PHACOEMULSIFICATION, CATARACT, WITH IOL INSERTION
Anesthesia: Monitor Anesthesia Care | Site: Eye | Laterality: Right | Wound class: Clean

## 2011-03-29 MED ORDER — LACTATED RINGERS IV SOLN
INTRAVENOUS | Status: DC
  Administered 2011-03-29: 10:00:00 via INTRAVENOUS

## 2011-03-29 MED ORDER — TETRACAINE HCL 0.5 % OP SOLN
OPHTHALMIC | Status: AC
  Administered 2011-03-29: 1 [drp] via OPHTHALMIC
  Filled 2011-03-29: qty 2

## 2011-03-29 MED ORDER — LIDOCAINE HCL (PF) 1 % IJ SOLN
INTRAMUSCULAR | Status: AC
  Filled 2011-03-29: qty 2

## 2011-03-29 MED ORDER — LIDOCAINE HCL 3.5 % OP GEL
OPHTHALMIC | Status: AC
  Administered 2011-03-29: 1 via OPHTHALMIC
  Filled 2011-03-29: qty 5

## 2011-03-29 MED ORDER — ONDANSETRON HCL 4 MG/2ML IJ SOLN: 4.0000 mg | Freq: Once | INTRAMUSCULAR | Status: DC | PRN

## 2011-03-29 MED ORDER — CYCLOPENTOLATE-PHENYLEPHRINE 0.2-1 % OP SOLN
OPHTHALMIC | Status: AC
  Administered 2011-03-29: 1 [drp] via OPHTHALMIC
  Filled 2011-03-29: qty 2

## 2011-03-29 MED ORDER — EPINEPHRINE HCL 1 MG/ML IJ SOLN
INTRAMUSCULAR | Status: AC
  Filled 2011-03-29: qty 1

## 2011-03-29 MED ORDER — PHENYLEPHRINE HCL 2.5 % OP SOLN
OPHTHALMIC | Status: AC
  Administered 2011-03-29: 1 [drp] via OPHTHALMIC
  Filled 2011-03-29: qty 2

## 2011-03-29 MED ORDER — EPINEPHRINE HCL 1 MG/ML IJ SOLN
INTRAOCULAR | Status: DC | PRN
  Administered 2011-03-29: 10:00:00

## 2011-03-29 MED ORDER — POVIDONE-IODINE 5 % OP SOLN
OPHTHALMIC | Status: DC | PRN
  Administered 2011-03-29: 1 via OPHTHALMIC

## 2011-03-29 MED ORDER — PROVISC 10 MG/ML IO SOLN
INTRAOCULAR | Status: DC | PRN
  Administered 2011-03-29: 8.5 mg via OPHTHALMIC

## 2011-03-29 MED ORDER — MIDAZOLAM HCL 2 MG/2ML IJ SOLN
1.0000 mg | INTRAMUSCULAR | Status: DC | PRN
  Administered 2011-03-29: 2 mg via INTRAVENOUS

## 2011-03-29 MED ORDER — PHENYLEPHRINE HCL 2.5 % OP SOLN
1.0000 [drp] | OPHTHALMIC | Status: AC
  Administered 2011-03-29 (×3): 1 [drp] via OPHTHALMIC

## 2011-03-29 MED ORDER — LIDOCAINE HCL 3.5 % OP GEL
1.0000 "application " | Freq: Once | OPHTHALMIC | Status: AC
  Administered 2011-03-29: 1 via OPHTHALMIC

## 2011-03-29 MED ORDER — MIDAZOLAM HCL 2 MG/2ML IJ SOLN
INTRAMUSCULAR | Status: AC
  Filled 2011-03-29: qty 2

## 2011-03-29 MED ORDER — NEOMYCIN-POLYMYXIN-DEXAMETH 3.5-10000-0.1 OP OINT
TOPICAL_OINTMENT | OPHTHALMIC | Status: AC
  Filled 2011-03-29: qty 3.5

## 2011-03-29 MED ORDER — NEOMYCIN-POLYMYXIN-DEXAMETH 0.1 % OP OINT
TOPICAL_OINTMENT | OPHTHALMIC | Status: DC | PRN
  Administered 2011-03-29: 1 via OPHTHALMIC

## 2011-03-29 MED ORDER — TETRACAINE HCL 0.5 % OP SOLN
1.0000 [drp] | OPHTHALMIC | Status: AC
  Administered 2011-03-29 (×3): 1 [drp] via OPHTHALMIC

## 2011-03-29 MED ORDER — CYCLOPENTOLATE-PHENYLEPHRINE 0.2-1 % OP SOLN
1.0000 [drp] | OPHTHALMIC | Status: AC
  Administered 2011-03-29 (×3): 1 [drp] via OPHTHALMIC

## 2011-03-29 MED ORDER — LIDOCAINE 3.5 % OP GEL OPTIME - NO CHARGE
OPHTHALMIC | Status: DC | PRN
  Administered 2011-03-29: 1 [drp] via OPHTHALMIC

## 2011-03-29 MED ORDER — LIDOCAINE HCL (PF) 1 % IJ SOLN
INTRAMUSCULAR | Status: DC | PRN
  Administered 2011-03-29: .2 mL

## 2011-03-29 MED ORDER — FENTANYL CITRATE 0.05 MG/ML IJ SOLN: 25.0000 ug | INTRAMUSCULAR | Status: DC | PRN

## 2011-03-29 MED ORDER — BSS IO SOLN
INTRAOCULAR | Status: DC | PRN
  Administered 2011-03-29: 15 mL via OPHTHALMIC

## 2011-03-29 SURGICAL SUPPLY — 34 items
CAPSULAR TENSION RING-AMO (OPHTHALMIC RELATED) IMPLANT
CLOTH BEACON ORANGE TIMEOUT ST (SAFETY) ×1 IMPLANT
DUOVISC SYSTEM (INTRAOCULAR LENS)
EYE SHIELD UNIVERSAL CLEAR (GAUZE/BANDAGES/DRESSINGS) ×1 IMPLANT
GLOVE BIO SURGEON STRL SZ 6.5 (GLOVE) IMPLANT
GLOVE BIOGEL PI IND STRL 6.5 (GLOVE) IMPLANT
GLOVE BIOGEL PI IND STRL 7.0 (GLOVE) IMPLANT
GLOVE BIOGEL PI IND STRL 7.5 (GLOVE) IMPLANT
GLOVE BIOGEL PI INDICATOR 6.5 (GLOVE)
GLOVE BIOGEL PI INDICATOR 7.0 (GLOVE) ×1
GLOVE BIOGEL PI INDICATOR 7.5 (GLOVE)
GLOVE ECLIPSE 6.5 STRL STRAW (GLOVE) IMPLANT
GLOVE ECLIPSE 7.0 STRL STRAW (GLOVE) IMPLANT
GLOVE ECLIPSE 7.5 STRL STRAW (GLOVE) IMPLANT
GLOVE EXAM NITRILE LRG STRL (GLOVE) ×1 IMPLANT
GLOVE EXAM NITRILE MD LF STRL (GLOVE) IMPLANT
GLOVE SKINSENSE NS SZ6.5 (GLOVE)
GLOVE SKINSENSE NS SZ7.0 (GLOVE)
GLOVE SKINSENSE STRL SZ6.5 (GLOVE) IMPLANT
GLOVE SKINSENSE STRL SZ7.0 (GLOVE) IMPLANT
KIT VITRECTOMY (OPHTHALMIC RELATED) IMPLANT
PAD ARMBOARD 7.5X6 YLW CONV (MISCELLANEOUS) ×1 IMPLANT
PROC W NO LENS (INTRAOCULAR LENS)
PROC W SPEC LENS (INTRAOCULAR LENS)
PROCESS W NO LENS (INTRAOCULAR LENS) IMPLANT
PROCESS W SPEC LENS (INTRAOCULAR LENS) IMPLANT
RING MALYGIN (MISCELLANEOUS) IMPLANT
SIGHTPATH CAT PROC W REG LENS (Ophthalmic Related) ×2 IMPLANT
SYR TB 1ML LL NO SAFETY (SYRINGE) ×1 IMPLANT
SYSTEM DUOVISC (INTRAOCULAR LENS) IMPLANT
TAPE SURG TRANSPORE 1 IN (GAUZE/BANDAGES/DRESSINGS) IMPLANT
TAPE SURGICAL TRANSPORE 1 IN (GAUZE/BANDAGES/DRESSINGS) ×1
VISCOELASTIC ADDITIONAL (OPHTHALMIC RELATED) IMPLANT
WATER STERILE IRR 250ML POUR (IV SOLUTION) ×1 IMPLANT

## 2011-03-29 NOTE — Transfer of Care (Signed)
Immediate Anesthesia Transfer of Care Note  Patient: [name]  Procedure(s) Performed:  CATARACT EXTRACTION PHACO AND INTRAOCULAR LENS PLACEMENT (IOC) - CDE 12.62   Patient Location: PACU  Anesthesia Type: MAC  Level of Consciousness: awake  Airway & Oxygen Therapy: Patient Spontanous Breathing  Post-op Assessment: Report given to PACU RN  Post vital signs: Reviewed and stable  Complications: No apparent anesthesia complications

## 2011-03-29 NOTE — Anesthesia Preprocedure Evaluation (Signed)
Anesthesia Evaluation  Patient identified by MRN, date of birth, ID band Patient awake  General Assessment Comment  Reviewed: Allergy & Precautions, H&P , NPO status   Airway Mallampati: III      Dental  (+) Edentulous Upper   Pulmonary (+) shortness of breath COPD   Pulmonary exam normal       Cardiovascular hypertension, Pt. on medications + CAD + dysrhythmias Atrial Fibrillation + pacemaker + Valvular Problems/Murmurs MR Regular Normal    Neuro/Psych PSYCHIATRIC DISORDERS Anxiety CVA, Residual Symptoms    GI/Hepatic GERD   Endo/Other    Renal/GU Renal InsufficiencyRenal disease     Musculoskeletal   Abdominal   Peds  Hematology   Anesthesia Other Findings   Reproductive/Obstetrics                           Anesthesia Physical Anesthesia Plan  ASA: III  Anesthesia Plan: MAC   Post-op Pain Management:    Induction:   Airway Management Planned: Nasal Cannula  Additional Equipment:   Intra-op Plan:   Post-operative Plan:   Informed Consent: I have reviewed the patients History and Physical, chart, labs and discussed the procedure including the risks, benefits and alternatives for the proposed anesthesia with the patient or authorized representative who has indicated his/her understanding and acceptance.     Plan Discussed with:   Anesthesia Plan Comments:         Anesthesia Quick Evaluation

## 2011-03-29 NOTE — Brief Op Note (Signed)
Pre-Op Dx: Cataract OD Post-Op Dx: Cataract OD Surgeon: Cabella Kimm Anesthesia: Topical with MAC Implant: Lenstec, Model Softec HD Blood Loss: None Specimen: None Complications: None 

## 2011-03-29 NOTE — H&P (Signed)
I have reviewed the H&P, the patient was re-examined, and I have identified no interval changes in medical condition and plan of care since the history and physical of record  

## 2011-03-29 NOTE — Anesthesia Postprocedure Evaluation (Signed)
  Anesthesia Post-op Note  Patient: [name]  Procedure(s) Performed:  CATARACT EXTRACTION PHACO AND INTRAOCULAR LENS PLACEMENT (IOC) - CDE 12.62  Patient Location: PACU and Short Stay  Anesthesia Type: MAC  Level of Consciousness: awake  Airway and Oxygen Therapy: Patient Spontanous Breathing  Post-op Pain: none  Post-op Assessment: Post-op Vital signs reviewed  Post-op Vital Signs: Reviewed and stable  Complications: No apparent anesthesia complications

## 2011-03-30 NOTE — Op Note (Signed)
[name], Meghan NO.:  [id number]  MEDICAL RECORD NO.:  [id number]  LOCATION:  [facility]                          FACILITY:  [facility]  PHYSICIAN:  Richardo Hanks, MD       DATE OF BIRTH:  11-Mar-1927  DATE OF PROCEDURE:  03/29/2011 DATE OF DISCHARGE:  03/29/2011                              OPERATIVE REPORT   PREOPERATIVE DIAGNOSIS:  Combined cataract, right eye.  Diagnosis code 366.19.  POSTOPERATIVE DIAGNOSIS:  Combined cataract, right eye.  Diagnosis code 366.19.  OPERATION PERFORMED:  Phacoemulsification with posterior chamber intraocular lens implantation, right eye.  SURGEON:  Franky Macho. Emine Lopata, MD  ANESTHESIA:  General endotracheal anesthesia.  OPERATIVE SUMMARY:  In the preoperative area, dilating drops were placed into the right eye.  The patient was then brought into the operating room where she was placed under general anesthesia.  The eye was then prepped and draped.  Beginning with a 75 blade, a paracentesis port was made at the surgeon's 2 o'clock position.  The anterior chamber was then filled with a 1% nonpreserved lidocaine solution with epinephrine.  This was followed by Viscoat to deepen the chamber.  A small fornix-based peritomy was performed superiorly.  Next, a single iris hook was placed through the limbus superiorly.  A 2.4-mm keratome blade was then used to make a clear corneal incision over the iris hook.  A bent cystotome needle and Utrata forceps were used to create a continuous tear capsulotomy.  Hydrodissection was performed using balanced salt solution on a fine cannula.  The lens nucleus was then removed using phacoemulsification in a quadrant cracking technique.  The cortical material was then removed with irrigation and aspiration.  The capsular bag and anterior chamber were refilled with Provisc.  The wound was widened to approximately 3 mm and a posterior chamber intraocular lens was placed into the capsular bag without  difficulty using an [employer] lens injecting system.  A single 10-0 nylon suture was then used to close the incision as well as stromal hydration.  The Provisc was removed from the anterior chamber and capsular bag with irrigation and aspiration.  At this point, the wounds were tested for leak, which were negative.  The anterior chamber remained deep and stable.  The patient tolerated the procedure well.  There were no operative complications, and she awoke from general anesthesia without problem.  No surgical specimens.  Prosthetic device used is a Lenstec posterior chamber lens, model Softec HD, power of 19.5, serial number is [id number].          ______________________________ Richardo Hanks, MD     KEH/MEDQ  D:  03/29/2011  T:  03/30/2011  Job:  [id number]

## 2011-04-02 ENCOUNTER — Encounter (HOSPITAL_COMMUNITY): Payer: Self-pay | Admitting: Ophthalmology

## 2011-04-12 ENCOUNTER — Ambulatory Visit (INDEPENDENT_AMBULATORY_CARE_PROVIDER_SITE_OTHER): Payer: Medicare Other | Admitting: *Deleted

## 2011-04-12 DIAGNOSIS — I6350 Cerebral infarction due to unspecified occlusion or stenosis of unspecified cerebral artery: Secondary | ICD-10-CM

## 2011-04-12 DIAGNOSIS — I639 Cerebral infarction, unspecified: Secondary | ICD-10-CM

## 2011-04-12 DIAGNOSIS — I4891 Unspecified atrial fibrillation: Secondary | ICD-10-CM

## 2011-04-12 DIAGNOSIS — I495 Sick sinus syndrome: Secondary | ICD-10-CM

## 2011-04-12 DIAGNOSIS — Z7901 Long term (current) use of anticoagulants: Secondary | ICD-10-CM

## 2011-04-12 LAB — POCT INR: INR: 3.4

## 2011-05-10 ENCOUNTER — Ambulatory Visit (INDEPENDENT_AMBULATORY_CARE_PROVIDER_SITE_OTHER): Payer: Medicare Other | Admitting: *Deleted

## 2011-05-10 DIAGNOSIS — Z7901 Long term (current) use of anticoagulants: Secondary | ICD-10-CM

## 2011-05-10 DIAGNOSIS — I639 Cerebral infarction, unspecified: Secondary | ICD-10-CM

## 2011-05-10 DIAGNOSIS — I6350 Cerebral infarction due to unspecified occlusion or stenosis of unspecified cerebral artery: Secondary | ICD-10-CM

## 2011-05-10 DIAGNOSIS — I495 Sick sinus syndrome: Secondary | ICD-10-CM

## 2011-05-10 DIAGNOSIS — I4891 Unspecified atrial fibrillation: Secondary | ICD-10-CM

## 2011-05-18 ENCOUNTER — Encounter: Payer: Self-pay | Admitting: Internal Medicine

## 2011-05-20 ENCOUNTER — Encounter: Payer: Self-pay | Admitting: Internal Medicine

## 2011-05-20 ENCOUNTER — Ambulatory Visit (INDEPENDENT_AMBULATORY_CARE_PROVIDER_SITE_OTHER): Payer: Medicare Other | Admitting: *Deleted

## 2011-05-20 DIAGNOSIS — I495 Sick sinus syndrome: Secondary | ICD-10-CM

## 2011-05-20 LAB — PACEMAKER DEVICE OBSERVATION
AL THRESHOLD: 1.125 V
BAMS-0001: 150 {beats}/min
BAMS-0003: 70 {beats}/min
RV LEAD AMPLITUDE: 12 mv

## 2011-05-20 NOTE — Progress Notes (Signed)
PPM check 

## 2011-06-03 ENCOUNTER — Ambulatory Visit (INDEPENDENT_AMBULATORY_CARE_PROVIDER_SITE_OTHER): Payer: Medicare Other | Admitting: *Deleted

## 2011-06-03 DIAGNOSIS — I495 Sick sinus syndrome: Secondary | ICD-10-CM

## 2011-06-03 DIAGNOSIS — I6350 Cerebral infarction due to unspecified occlusion or stenosis of unspecified cerebral artery: Secondary | ICD-10-CM

## 2011-06-03 DIAGNOSIS — I639 Cerebral infarction, unspecified: Secondary | ICD-10-CM

## 2011-06-03 DIAGNOSIS — Z7901 Long term (current) use of anticoagulants: Secondary | ICD-10-CM

## 2011-06-03 DIAGNOSIS — I4891 Unspecified atrial fibrillation: Secondary | ICD-10-CM

## 2011-06-10 ENCOUNTER — Encounter: Payer: Medicare Other | Admitting: *Deleted

## 2011-06-21 ENCOUNTER — Encounter: Payer: Self-pay | Admitting: Internal Medicine

## 2011-07-01 ENCOUNTER — Ambulatory Visit (INDEPENDENT_AMBULATORY_CARE_PROVIDER_SITE_OTHER): Payer: Medicare Other | Admitting: *Deleted

## 2011-07-01 DIAGNOSIS — I6350 Cerebral infarction due to unspecified occlusion or stenosis of unspecified cerebral artery: Secondary | ICD-10-CM

## 2011-07-01 DIAGNOSIS — I495 Sick sinus syndrome: Secondary | ICD-10-CM

## 2011-07-01 DIAGNOSIS — Z7901 Long term (current) use of anticoagulants: Secondary | ICD-10-CM

## 2011-07-01 DIAGNOSIS — I639 Cerebral infarction, unspecified: Secondary | ICD-10-CM

## 2011-07-01 DIAGNOSIS — I4891 Unspecified atrial fibrillation: Secondary | ICD-10-CM

## 2011-07-12 ENCOUNTER — Encounter: Payer: Self-pay | Admitting: Internal Medicine

## 2011-07-15 ENCOUNTER — Ambulatory Visit (INDEPENDENT_AMBULATORY_CARE_PROVIDER_SITE_OTHER): Payer: Medicare Other | Admitting: *Deleted

## 2011-07-15 DIAGNOSIS — I639 Cerebral infarction, unspecified: Secondary | ICD-10-CM

## 2011-07-15 DIAGNOSIS — I495 Sick sinus syndrome: Secondary | ICD-10-CM

## 2011-07-15 DIAGNOSIS — I4891 Unspecified atrial fibrillation: Secondary | ICD-10-CM

## 2011-07-15 DIAGNOSIS — Z7901 Long term (current) use of anticoagulants: Secondary | ICD-10-CM

## 2011-07-15 DIAGNOSIS — I6350 Cerebral infarction due to unspecified occlusion or stenosis of unspecified cerebral artery: Secondary | ICD-10-CM

## 2011-08-12 ENCOUNTER — Other Ambulatory Visit: Payer: Self-pay | Admitting: Cardiology

## 2011-08-12 ENCOUNTER — Ambulatory Visit (INDEPENDENT_AMBULATORY_CARE_PROVIDER_SITE_OTHER): Payer: Medicare Other | Admitting: *Deleted

## 2011-08-12 DIAGNOSIS — I4891 Unspecified atrial fibrillation: Secondary | ICD-10-CM

## 2011-08-12 DIAGNOSIS — Z7901 Long term (current) use of anticoagulants: Secondary | ICD-10-CM

## 2011-08-12 DIAGNOSIS — I6350 Cerebral infarction due to unspecified occlusion or stenosis of unspecified cerebral artery: Secondary | ICD-10-CM

## 2011-08-12 DIAGNOSIS — I495 Sick sinus syndrome: Secondary | ICD-10-CM

## 2011-08-12 DIAGNOSIS — I639 Cerebral infarction, unspecified: Secondary | ICD-10-CM

## 2011-08-27 ENCOUNTER — Ambulatory Visit: Payer: Medicare Other | Admitting: Cardiology

## 2011-08-31 ENCOUNTER — Ambulatory Visit (INDEPENDENT_AMBULATORY_CARE_PROVIDER_SITE_OTHER): Payer: Medicare Other | Admitting: Internal Medicine

## 2011-08-31 ENCOUNTER — Encounter: Payer: Self-pay | Admitting: Internal Medicine

## 2011-08-31 VITALS — BP 138/76 | HR 74 | Ht 61.0 in | Wt 199.8 lb

## 2011-08-31 DIAGNOSIS — I495 Sick sinus syndrome: Secondary | ICD-10-CM

## 2011-08-31 DIAGNOSIS — I4891 Unspecified atrial fibrillation: Secondary | ICD-10-CM | POA: Insufficient documentation

## 2011-08-31 DIAGNOSIS — Z950 Presence of cardiac pacemaker: Secondary | ICD-10-CM

## 2011-08-31 LAB — PACEMAKER DEVICE OBSERVATION
AL IMPEDENCE PM: 525 Ohm
BRDY-0003RV: 120 {beats}/min
BRDY-0004RV: 120 {beats}/min
DEVICE MODEL PM: 2323973
RV LEAD IMPEDENCE PM: 525 Ohm

## 2011-08-31 NOTE — Assessment & Plan Note (Signed)
Her device is working normally today. We'll plan to recheck in several months.

## 2011-08-31 NOTE — Patient Instructions (Signed)
Your physician wants you to follow-up in: 12 months with Dr Knox Saliva will receive a reminder letter in the mail two months in advance. If you don't receive a letter, please call our office to schedule the follow-up appointment.   Remote monitoring is used to monitor your Pacemaker of ICD from home. This monitoring reduces the number of office visits required to check your device to one time per year. It allows [place] to keep an eye on the functioning of your device to ensure it is working properly. You are scheduled for a device check from home on 12/02/2011. You may send your transmission at any time that day. If you have a wireless device, the transmission will be sent automatically. After your physician reviews your transmission, you will receive a postcard with your next transmission date.

## 2011-08-31 NOTE — Assessment & Plan Note (Signed)
Her ventricular rate appears to be well-controlled. She will continue her current medical therapy.

## 2011-08-31 NOTE — Progress Notes (Signed)
Patient ID: [name], female   DOB: 1926-12-01, 78 y.o.   MRN: [id number] HPI [name] returns today for followup. She is a very pleasant 78 year old woman with a, hypertension, symptomatic bradycardia, atrial fibrillation, status post permanent pacemaker insertion. She denies chest pain. She admits to being fairly sedentary. She denies sodium indiscretion. No syncope.  No Known Allergies   Current Outpatient Prescriptions  Medication Sig Dispense Refill  . acetaminophen (TYLENOL) 500 MG tablet Take 500 mg by mouth every 6 (six) hours as needed. Pain       . albuterol (PROVENTIL) (2.5 MG/3ML) 0.083% nebulizer solution Take 2.5 mg by nebulization every 6 (six) hours as needed.       Marland Kitchen allopurinol (ZYLOPRIM) 100 MG tablet Take 100 mg by mouth daily.        Marland Kitchen diltiazem (TIAZAC) 360 MG 24 hr capsule TAKE (1) CAPSULE BY MOUTHONCE DAILY.  30 capsule  6  . dorzolamide-timolol (COSOPT) 22.3-6.8 MG/ML ophthalmic solution Place 1 drop into the left eye daily.        . meclizine (ANTIVERT) 25 MG tablet Take 25 mg by mouth 3 (three) times daily as needed. Dizziness       . PRAVACHOL 80 MG tablet TAKE (1) TABLET BY MOUTH AT BEDTIME.  30 each  6  . ranitidine (ZANTAC) 150 MG tablet Take 150 mg by mouth at bedtime.       Marland Kitchen warfarin (COUMADIN) 5 MG tablet TAKE ONE TABLET DAILY OR AS DIRECTED BY CLINIC.  30 tablet  1     Past Medical History  Diagnosis Date  . Tachycardia-bradycardia syndrome     with pauses and syncope;PPM implantation 10/2008,negative stress nuclear study 03/2005  . Atrial fibrillation     persistant  . Mitral valve disease     Severe mitral annular calcification; mild to moderate stenosis-not clearly rheumatic  . Hypertension     heart disease diastolic dysfunction ;mild pulmonary edema in 2006;responded to diurectics ; LVH  . Chronic renal insufficiency     baseline creatine 1.4; 1.04 in 03/2010  . Hyperlipidemia   . Angioedema 08/2006  . Anemia     H/H of 11.3/36.7  in 12/2008 ;normal MCV; normal CBC in 2011  . Gout   . 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 Vertigo   . Shortness of breath   . Chronic bronchitis   . Blood transfusion   . DJD (degenerative joint disease)     hands  . Seizure disorder   . Diastolic dysfunction     with EF of 80%  . Nephrolithiasis   . Arthritis   . Stroke   . Retinal hemorrhage     ROS:   All systems reviewed and negative except as noted in the HPI.   Past Surgical History  Procedure Date  . Insert / replace / remove pacemaker 11/2008    [employer].Jude DUAL chamber pacemaker 11/06/2008  . Total knee arthroplasty 04/2009    Right  . Joint replacement 2010    right   . Cataract extraction w/phaco 03/29/2011    Procedure: CATARACT EXTRACTION PHACO AND INTRAOCULAR LENS PLACEMENT (IOC);  Surgeon: Tonny Branch;  Location: [facility];  Service: Ophthalmology;  Laterality: Right;  CDE 12.62     Family History  Problem Relation Age of Onset  .  Hypotension Neg Hx   . Anesthesia problems Neg Hx   . Pseudochol deficiency Neg Hx   . Malignant hyperthermia Neg Hx   . Cancer Other     unknown cancer     History   Social History  . Marital Status: Married    Spouse Name: N/A    Number of Children: N/A  . Years of Education: N/A   Occupational History  . Not on file.   Social History Main Topics  . Smoking status: Never Smoker   . Smokeless tobacco: Never Used  . Alcohol Use: No  . Drug Use: No  . Sexually Active: Not on file   Other Topics Concern  . Not on file   Social History Narrative  . No narrative on file     BP 138/76  Pulse 74  Ht 5\' 1"  (1.549 m)  Wt 90.629 kg (199 lb 12.8 oz)  BMI 37.75 kg/m2  Physical Exam:  Well appearing Elderly woman, NAD HEENT: Unremarkable Neck:  No JVD, no  thyromegally Lungs:  Clear with no wheezes, rales, rhonchi.  HEART:  IRegular rate rhythm, no murmurs, no rubs, no clicks Abd:  soft, positive bowel sounds, no organomegally, no rebound, no guarding Ext:  2 plus pulses, no edema, no cyanosis, no clubbing Skin:  No rashes no nodules Neuro:  CN II through XII intact, motor grossly intact  DEVICE  Normal device function.  See PaceArt for details.   Assess/Plan:

## 2011-09-09 ENCOUNTER — Ambulatory Visit (INDEPENDENT_AMBULATORY_CARE_PROVIDER_SITE_OTHER): Payer: Medicare Other | Admitting: Cardiology

## 2011-09-09 ENCOUNTER — Encounter: Payer: Self-pay | Admitting: Cardiology

## 2011-09-09 ENCOUNTER — Ambulatory Visit (INDEPENDENT_AMBULATORY_CARE_PROVIDER_SITE_OTHER): Payer: Medicare Other | Admitting: *Deleted

## 2011-09-09 VITALS — BP 140/63 | HR 74 | Ht 61.0 in | Wt 202.0 lb

## 2011-09-09 DIAGNOSIS — T783XXA Angioneurotic edema, initial encounter: Secondary | ICD-10-CM

## 2011-09-09 DIAGNOSIS — E785 Hyperlipidemia, unspecified: Secondary | ICD-10-CM

## 2011-09-09 DIAGNOSIS — Z7901 Long term (current) use of anticoagulants: Secondary | ICD-10-CM

## 2011-09-09 DIAGNOSIS — I4891 Unspecified atrial fibrillation: Secondary | ICD-10-CM

## 2011-09-09 DIAGNOSIS — R42 Dizziness and giddiness: Secondary | ICD-10-CM

## 2011-09-09 DIAGNOSIS — I639 Cerebral infarction, unspecified: Secondary | ICD-10-CM

## 2011-09-09 DIAGNOSIS — I495 Sick sinus syndrome: Secondary | ICD-10-CM

## 2011-09-09 DIAGNOSIS — I6350 Cerebral infarction due to unspecified occlusion or stenosis of unspecified cerebral artery: Secondary | ICD-10-CM

## 2011-09-09 DIAGNOSIS — I10 Essential (primary) hypertension: Secondary | ICD-10-CM

## 2011-09-09 LAB — POCT INR: INR: 4.5

## 2011-09-09 NOTE — Assessment & Plan Note (Signed)
Presumed inner ear etiology.  Patient does well with as needed meclizine.

## 2011-09-09 NOTE — Assessment & Plan Note (Addendum)
Lipid profile remains excellent with current therapy, which will be continued.

## 2011-09-09 NOTE — Assessment & Plan Note (Addendum)
Blood pressure control is slightly suboptimal at this visit.  Patient advised to attempt to lose the 16 pounds she has gained, which will likely result in normalization of mildly elevated systolic blood pressure.

## 2011-09-09 NOTE — Assessment & Plan Note (Signed)
Strategy of rate control, which has been excellent, and chronic anticoagulation will be continued.

## 2011-09-09 NOTE — Assessment & Plan Note (Signed)
Never underwent colonoscopy; normal CBC in 08/2011.  Stool specimens for Hemoccult testing had been requested.

## 2011-09-09 NOTE — Patient Instructions (Signed)
Your physician recommends that you schedule a follow-up appointment in: 6 months  Stool cards x 3 and return to office  Weight loss (information included)  Increase activity  Your physician has requested that you regularly monitor and record your blood pressure readings at home. Please use the same machine at the same time of day to check your readings and record them to bring to your follow-up visit.

## 2011-09-09 NOTE — Progress Notes (Signed)
Patient ID: [name], female   DOB: [birth year], 78 y.o.   MRN: [id number]  HPI: Scheduled return visit for this very nice woman with long-standing sick sinus syndrome, pacemaker implantation, hypertension and hyperlipidemia.  She was recently evaluated by Dr. Lovena Le, who found her pacemaker to be functioning normally and her ventricular rate in atrial fibrillation to be well controlled.  Patient has some chronic fatigue and exercise intolerance, but this is unchanged.  She had symptoms of bronchitis recently but responded to medical therapy.  Lifestyle is sedentary.  She experienced vertigo yesterday that responded to treatment with meclizine.  Prior to Admission medications   Medication Sig Start Date End Date Taking? Authorizing Provider  acetaminophen (TYLENOL) 500 MG tablet Take 500 mg by mouth every 6 (six) hours as needed. Pain    Yes Historical Provider, MD  albuterol (PROVENTIL) (2.5 MG/3ML) 0.083% nebulizer solution Take 2.5 mg by nebulization every 6 (six) hours as needed.    Yes Historical Provider, MD  allopurinol (ZYLOPRIM) 100 MG tablet Take 100 mg by mouth daily.     Yes Historical Provider, MD  diltiazem (TIAZAC) 360 MG 24 hr capsule TAKE (1) CAPSULE BY MOUTHONCE DAILY. 01/19/11  Yes Yehuda Savannah, MD  dorzolamide-timolol (COSOPT) 22.3-6.8 MG/ML ophthalmic solution Place 1 drop into the left eye daily.     Yes Historical Provider, MD  meclizine (ANTIVERT) 25 MG tablet Take 25 mg by mouth 3 (three) times daily as needed. Dizziness  11/11/10  Yes Imogene Burn, PA  PRAVACHOL 80 MG tablet TAKE (1) TABLET BY MOUTH AT BEDTIME. 03/10/11  Yes Yehuda Savannah, MD  ranitidine (ZANTAC) 150 MG tablet Take 150 mg by mouth at bedtime.    Yes Historical Provider, MD  warfarin (COUMADIN) 5 MG tablet TAKE ONE TABLET DAILY OR AS DIRECTED BY CLINIC. 08/12/11  Yes Yehuda Savannah, MD   No Known Allergies    Past medical history, social history, and family history reviewed and  updated.  ROS: Denies orthopnea, PND, lightheadedness or syncope.  She notes no pedal edema.  All other systems reviewed and are negative.  PHYSICAL EXAM: BP 149/82  Pulse 78  Ht 5\' 1"  (1.549 m)  Wt 91.627 kg (202 lb)  BMI 38.17 kg/m2   General-Well developed; no acute distress Body habitus-proportionate weight and height Neck-No JVD; no carotid bruits Lungs-clear lung fields; resonant to percussion Cardiovascular-normal PMI; normal S1 and S2 Abdomen-normal bowel sounds; soft and non-tender without masses or organomegaly Musculoskeletal-No deformities, no cyanosis or clubbing Neurologic-Normal cranial nerves; symmetric strength and tone Skin-Warm, no significant lesions Extremities-1-2+ distal pulses; no edema  ASSESSMENT AND PLAN:  Jacqulyn Ducking, MD 09/09/2011 1:37 PM

## 2011-09-23 ENCOUNTER — Ambulatory Visit (INDEPENDENT_AMBULATORY_CARE_PROVIDER_SITE_OTHER): Payer: Medicare Other | Admitting: *Deleted

## 2011-09-23 DIAGNOSIS — Z7901 Long term (current) use of anticoagulants: Secondary | ICD-10-CM

## 2011-09-23 DIAGNOSIS — I495 Sick sinus syndrome: Secondary | ICD-10-CM

## 2011-09-23 DIAGNOSIS — I6350 Cerebral infarction due to unspecified occlusion or stenosis of unspecified cerebral artery: Secondary | ICD-10-CM

## 2011-09-23 DIAGNOSIS — I639 Cerebral infarction, unspecified: Secondary | ICD-10-CM

## 2011-09-23 DIAGNOSIS — I4891 Unspecified atrial fibrillation: Secondary | ICD-10-CM

## 2011-09-24 ENCOUNTER — Encounter: Payer: Self-pay | Admitting: *Deleted

## 2011-10-05 ENCOUNTER — Telehealth: Payer: Self-pay | Admitting: *Deleted

## 2011-10-05 NOTE — Telephone Encounter (Signed)
Follow up to letter sent regarding delinquent stool cards - Patient states that she has stool cards and will bring them by this week.

## 2011-10-08 ENCOUNTER — Encounter (INDEPENDENT_AMBULATORY_CARE_PROVIDER_SITE_OTHER): Payer: Medicare Other

## 2011-10-08 DIAGNOSIS — Z7901 Long term (current) use of anticoagulants: Secondary | ICD-10-CM

## 2011-10-12 ENCOUNTER — Other Ambulatory Visit: Payer: Self-pay

## 2011-10-12 DIAGNOSIS — Z7901 Long term (current) use of anticoagulants: Secondary | ICD-10-CM

## 2011-10-14 ENCOUNTER — Ambulatory Visit (INDEPENDENT_AMBULATORY_CARE_PROVIDER_SITE_OTHER): Payer: Medicare Other | Admitting: *Deleted

## 2011-10-14 ENCOUNTER — Other Ambulatory Visit: Payer: Self-pay | Admitting: Cardiology

## 2011-10-14 DIAGNOSIS — I6350 Cerebral infarction due to unspecified occlusion or stenosis of unspecified cerebral artery: Secondary | ICD-10-CM

## 2011-10-14 DIAGNOSIS — I4891 Unspecified atrial fibrillation: Secondary | ICD-10-CM

## 2011-10-14 DIAGNOSIS — Z7901 Long term (current) use of anticoagulants: Secondary | ICD-10-CM

## 2011-10-14 DIAGNOSIS — I495 Sick sinus syndrome: Secondary | ICD-10-CM

## 2011-10-14 DIAGNOSIS — I639 Cerebral infarction, unspecified: Secondary | ICD-10-CM

## 2011-11-08 ENCOUNTER — Other Ambulatory Visit: Payer: Self-pay | Admitting: Cardiology

## 2011-11-17 ENCOUNTER — Ambulatory Visit (INDEPENDENT_AMBULATORY_CARE_PROVIDER_SITE_OTHER): Payer: Medicare Other | Admitting: *Deleted

## 2011-11-17 DIAGNOSIS — I4891 Unspecified atrial fibrillation: Secondary | ICD-10-CM

## 2011-11-17 DIAGNOSIS — I6350 Cerebral infarction due to unspecified occlusion or stenosis of unspecified cerebral artery: Secondary | ICD-10-CM

## 2011-11-17 DIAGNOSIS — I639 Cerebral infarction, unspecified: Secondary | ICD-10-CM

## 2011-11-17 DIAGNOSIS — I495 Sick sinus syndrome: Secondary | ICD-10-CM

## 2011-11-17 DIAGNOSIS — Z7901 Long term (current) use of anticoagulants: Secondary | ICD-10-CM

## 2011-11-17 LAB — POCT INR: INR: 3.3

## 2011-11-17 MED ORDER — WARFARIN SODIUM 5 MG PO TABS: 5.0000 mg | ORAL_TABLET | Freq: Every day | ORAL | Status: DC

## 2011-12-02 ENCOUNTER — Encounter: Payer: Self-pay | Admitting: Internal Medicine

## 2011-12-02 ENCOUNTER — Ambulatory Visit (INDEPENDENT_AMBULATORY_CARE_PROVIDER_SITE_OTHER): Payer: Medicare Other | Admitting: *Deleted

## 2011-12-02 DIAGNOSIS — I4891 Unspecified atrial fibrillation: Secondary | ICD-10-CM

## 2011-12-02 DIAGNOSIS — I495 Sick sinus syndrome: Secondary | ICD-10-CM

## 2011-12-03 ENCOUNTER — Encounter: Payer: Self-pay | Admitting: *Deleted

## 2011-12-07 ENCOUNTER — Emergency Department (HOSPITAL_COMMUNITY)
Admission: EM | Admit: 2011-12-07 | Discharge: 2011-12-07 | Disposition: A | Discharge status: Home / Self Care | Payer: Medicare Other | Attending: Emergency Medicine | Admitting: Emergency Medicine

## 2011-12-07 ENCOUNTER — Emergency Department (HOSPITAL_COMMUNITY): Payer: Medicare Other

## 2011-12-07 ENCOUNTER — Encounter (HOSPITAL_COMMUNITY): Payer: Self-pay | Admitting: *Deleted

## 2011-12-07 DIAGNOSIS — R05 Cough: Secondary | ICD-10-CM | POA: Insufficient documentation

## 2011-12-07 DIAGNOSIS — Z8673 Personal history of transient ischemic attack (TIA), and cerebral infarction without residual deficits: Secondary | ICD-10-CM | POA: Insufficient documentation

## 2011-12-07 DIAGNOSIS — I4891 Unspecified atrial fibrillation: Secondary | ICD-10-CM | POA: Insufficient documentation

## 2011-12-07 DIAGNOSIS — R062 Wheezing: Secondary | ICD-10-CM | POA: Insufficient documentation

## 2011-12-07 DIAGNOSIS — Z862 Personal history of diseases of the blood and blood-forming organs and certain disorders involving the immune mechanism: Secondary | ICD-10-CM | POA: Insufficient documentation

## 2011-12-07 DIAGNOSIS — E785 Hyperlipidemia, unspecified: Secondary | ICD-10-CM | POA: Insufficient documentation

## 2011-12-07 DIAGNOSIS — I509 Heart failure, unspecified: Secondary | ICD-10-CM | POA: Insufficient documentation

## 2011-12-07 DIAGNOSIS — Z8639 Personal history of other endocrine, nutritional and metabolic disease: Secondary | ICD-10-CM | POA: Insufficient documentation

## 2011-12-07 DIAGNOSIS — R609 Edema, unspecified: Secondary | ICD-10-CM | POA: Insufficient documentation

## 2011-12-07 DIAGNOSIS — R0789 Other chest pain: Secondary | ICD-10-CM

## 2011-12-07 DIAGNOSIS — R011 Cardiac murmur, unspecified: Secondary | ICD-10-CM | POA: Insufficient documentation

## 2011-12-07 DIAGNOSIS — R61 Generalized hyperhidrosis: Secondary | ICD-10-CM | POA: Insufficient documentation

## 2011-12-07 DIAGNOSIS — G40909 Epilepsy, unspecified, not intractable, without status epilepticus: Secondary | ICD-10-CM | POA: Insufficient documentation

## 2011-12-07 DIAGNOSIS — Z79899 Other long term (current) drug therapy: Secondary | ICD-10-CM | POA: Insufficient documentation

## 2011-12-07 DIAGNOSIS — I10 Essential (primary) hypertension: Secondary | ICD-10-CM | POA: Insufficient documentation

## 2011-12-07 DIAGNOSIS — R059 Cough, unspecified: Secondary | ICD-10-CM | POA: Insufficient documentation

## 2011-12-07 DIAGNOSIS — M129 Arthropathy, unspecified: Secondary | ICD-10-CM | POA: Insufficient documentation

## 2011-12-07 DIAGNOSIS — R0602 Shortness of breath: Secondary | ICD-10-CM | POA: Insufficient documentation

## 2011-12-07 LAB — CBC WITH DIFFERENTIAL/PLATELET
Basophils Absolute: 0 10*3/uL (ref 0.0–0.1)
Basophils Relative: 0 % (ref 0–1)
Eosinophils Relative: 1 % (ref 0–5)
HCT: 43.8 % (ref 36.0–46.0)
Lymphocytes Relative: 34 % (ref 12–46)
MCHC: 33.1 g/dL (ref 30.0–36.0)
MCV: 92.8 fL (ref 78.0–100.0)
Monocytes Absolute: 0.6 10*3/uL (ref 0.1–1.0)
RDW: 13.4 % (ref 11.5–15.5)

## 2011-12-07 LAB — BASIC METABOLIC PANEL
BUN: 14 mg/dL (ref 6–23)
CO2: 25 mEq/L (ref 19–32)
Calcium: 10.4 mg/dL (ref 8.4–10.5)
Creatinine, Ser: 1 mg/dL (ref 0.50–1.10)

## 2011-12-07 LAB — PROTIME-INR: Prothrombin Time: 21.5 seconds — ABNORMAL HIGH (ref 11.6–15.2)

## 2011-12-07 MED ORDER — ALBUTEROL SULFATE (5 MG/ML) 0.5% IN NEBU
2.5000 mg | INHALATION_SOLUTION | Freq: Once | RESPIRATORY_TRACT | Status: AC
  Administered 2011-12-07: 2.5 mg via RESPIRATORY_TRACT
  Filled 2011-12-07: qty 0.5

## 2011-12-07 MED ORDER — ALBUTEROL SULFATE HFA 108 (90 BASE) MCG/ACT IN AERS: 2.0000 | INHALATION_SPRAY | RESPIRATORY_TRACT | Status: DC | PRN

## 2011-12-07 MED ORDER — IPRATROPIUM BROMIDE 0.02 % IN SOLN
0.5000 mg | Freq: Once | RESPIRATORY_TRACT | Status: AC
  Administered 2011-12-07: 0.5 mg via RESPIRATORY_TRACT
  Filled 2011-12-07: qty 2.5

## 2011-12-07 MED ORDER — FUROSEMIDE 40 MG PO TABS: 40.0000 mg | ORAL_TABLET | Freq: Every day | ORAL | Status: DC

## 2011-12-07 NOTE — ED Notes (Signed)
Pt c/o pain in the right side of her chest and ribs since Saturday. Also c/o congested cough, wheezing and shortness of breath. Sent from Dr. Nolon Rod office for evaluation. States that her pain is worse with breathing.

## 2011-12-07 NOTE — Discharge Instructions (Signed)
Stay on a low-salt diet. Return to the emergency department if your symptoms are getting worse.  Chest Pain (Nonspecific) It is often hard to give a specific diagnosis for the cause of chest pain. There is always a chance that your pain could be related to something serious, such as a heart attack or a blood clot in the lungs. You need to follow up with your caregiver for further evaluation. CAUSES   Heartburn.   Pneumonia or bronchitis.   Anxiety or stress.   Inflammation around your heart (pericarditis) or lung (pleuritis or pleurisy).   A blood clot in the lung.   A collapsed lung (pneumothorax). It can develop suddenly on its own (spontaneous pneumothorax) or from injury (trauma) to the chest.   Shingles infection (herpes zoster virus).  The chest wall is composed of bones, muscles, and cartilage. Any of these can be the source of the pain.  The bones can be bruised by injury.   The muscles or cartilage can be strained by coughing or overwork.   The cartilage can be affected by inflammation and become sore (costochondritis).  DIAGNOSIS  Lab tests or other studies, such as X-rays, electrocardiography, stress testing, or cardiac imaging, may be needed to find the cause of your pain.  TREATMENT   Treatment depends on what may be causing your chest pain. Treatment may include:   Acid blockers for heartburn.   Anti-inflammatory medicine.   Pain medicine for inflammatory conditions.   Antibiotics if an infection is present.   You may be advised to change lifestyle habits. This includes stopping smoking and avoiding alcohol, caffeine, and chocolate.   You may be advised to keep your head raised (elevated) when sleeping. This reduces the chance of acid going backward from your stomach into your esophagus.   Most of the time, nonspecific chest pain will improve within 2 to 3 days with rest and mild pain medicine.  HOME CARE INSTRUCTIONS   If antibiotics were prescribed, take  your antibiotics as directed. Finish them even if you start to feel better.   For the next few days, avoid physical activities that bring on chest pain. Continue physical activities as directed.   Do not smoke.   Avoid drinking alcohol.   Only take over-the-counter or prescription medicine for pain, discomfort, or fever as directed by your caregiver.   Follow your caregiver's suggestions for further testing if your chest pain does not go away.   Keep any follow-up appointments you made. If you do not go to an appointment, you could develop lasting (chronic) problems with pain. If there is any problem keeping an appointment, you must call to reschedule.  SEEK MEDICAL CARE IF:   You think you are having problems from the medicine you are taking. Read your medicine instructions carefully.   Your chest pain does not go away, even after treatment.   You develop a rash with blisters on your chest.  SEEK IMMEDIATE MEDICAL CARE IF:   You have increased chest pain or pain that spreads to your arm, neck, jaw, back, or abdomen.   You develop shortness of breath, an increasing cough, or you are coughing up blood.   You have severe back or abdominal pain, feel nauseous, or vomit.   You develop severe weakness, fainting, or chills.   You have a fever.  THIS IS AN EMERGENCY. Do not wait to see if the pain will go away. Get medical help at once. Call your local emergency services (911 in  [place] Do not drive yourself to the hospital. MAKE SURE YOU:   Understand these instructions.   Will watch your condition.   Will get help right away if you are not doing well or get worse.  Document Released: 03/03/2005 Document Revised: 05/13/2011 Document Reviewed: 12/28/2007 [facility] Patient Information 2012 [place].  Heart Failure Heart failure (HF) is a condition in which the heart has trouble pumping blood. This means your heart does not pump blood efficiently for your body to work well. In  some cases of HF, fluid may back up into your lungs or you may have swelling (edema) in your lower legs. HF is a long-term (chronic) condition. It is important for you to take good care of yourself and follow your caregiver's treatment plan. CAUSES   Health conditions:   High blood pressure (hypertension) causes the heart muscle to work harder than normal. When pressure in the blood vessels is high, the heart needs to pump (contract) with more force in order to circulate blood throughout the body. High blood pressure eventually causes the heart to become stiff and weak.   Coronary artery disease (CAD) is the buildup of cholesterol and fat (plaques) in the arteries of the heart. The blockage in the arteries deprives the heart muscle of oxygen and blood. This can cause chest pain and may lead to a heart attack. High blood pressure can also contribute to CAD.   Heart attack (myocardial infarction) occurs when 1 or more arteries in the heart become blocked. The loss of oxygen damages the muscle tissue of the heart. When this happens, part of the heart muscle dies. The injured tissue does not contract as well and weakens the heart's ability to pump blood.   Abnormal heart valves can cause HF when the heart valves do not open and close properly. This makes the heart muscle pump harder to keep the blood flowing.   Heart muscle disease (cardiomyopathy or myocarditis) is damage to the heart muscle from a variety of causes. These can include drug or alcohol abuse, infections, or unknown reasons. These can increase the risk of HF.   Lung disease makes the heart work harder because the lungs do not work properly. This can cause a strain on the heart leading it to fail.   Diabetes increases the risk of HF. High blood sugar contributes to high fat (lipid) levels in the blood. Diabetes can also cause slow damage to tiny blood vessels that carry important nutrients to the heart muscle. When the heart does not get  enough oxygen and food, it can cause the heart to become weak and stiff. This leads to a heart that does not contract efficiently.   Other diseases can contribute to HF. These include abnormal heart rhythms, thyroid problems, and low blood counts (anemia).   Unhealthy lifestyle habits:   Obesity.   Smoking.   Eating foods high in fat and cholesterol.   Eating or drinking beverages high in salt.   Drug or alcohol abuse.   Lack of exercise.  SYMPTOMS  HF symptoms may vary and can be hard to detect. Symptoms may include:  Shortness of breath with activity, such as climbing stairs.   Persistent cough.   Swelling of the feet, ankles, legs, or abdomen.   Unexplained weight gain.   Difficulty breathing when lying flat.   Waking from sleep because of the need to sit up and get more air.   Rapid heartbeat.   Fatigue and loss of energy.  Feeling lightheaded or close to fainting.  DIAGNOSIS  A diagnosis of HF is based on your history, symptoms, physical examination, and diagnostic tests. Diagnostic tests for HF may include:  EKG.   Chest X-ray.   Blood tests.   Exercise stress test.   Blood oxygen test (arterial blood gas).   Evaluation by a heart doctor (cardiologist).   Ultrasound evaluation of the heart (echocardiogram).   Heart artery test to look for blockages (angiogram).   Radioactive imaging to look at the heart (radionuclide test).  TREATMENT  Treatment is aimed at managing the symptoms of HF. Medicines, lifestyle changes, or surgical intervention may be necessary to treat HF.  Medicines to help treat HF may include:   Angiotensin-converting enzyme (ACE) inhibitors. These block the effects of a blood protein called angiotensin-converting enzyme. ACE inhibitors relax (dilate) the blood vessels and help lower blood pressure. This decreases the workload of the heart, slows the progression of HF, and improves symptoms.   Angiotensin receptor blockers  (ARBs). These medications work similar to ACE inhibitors. ARBs may be an alternative for people who cannot tolerate an ACE inhibitor.   Aldosterone antagonists. This medication helps get rid of extra fluid from your body. This lowers the volume of blood the heart has to pump.   Water pills (diuretics). Diuretics cause the kidneys to remove salt and water from the blood. The extra fluid is removed by urination. By removing extra fluid from the body, diuretics help lower the workload of the heart and help prevent fluid buildup in the lungs so breathing is easier.   Beta blockers. These prevent the heart from beating too fast and improve heart muscle strength. Beta blockers help maintain a normal heart rate, control blood pressure, and improve HF symptoms.   Digitalis. This increases the force of the heartbeat and may be helpful to people with HF or heart rhythm problems.   Healthy lifestyle changes include:   Stopping smoking.   Eating a healthy diet. Avoid foods high in fat. Avoid foods fried in oil or made with fat. A dietician can help with healthy food choices.   Limiting how much salt you eat.   Limiting alcohol intake to no more than 1 drink per day for women and 2 drinks per day for men. Drinking more than that is harmful to your heart. If your heart has already been damaged by alcohol or you have severe HF, drinking alcohol should be stopped completely.   Exercising as directed by your caregiver.   Surgical treatment for HF may include:   Procedures to open blocked arteries, repair damaged heart valves, or remove damaged heart muscle tissue.   A pacemaker to help heart muscle function and to control certain abnormal heart rhythms.   A defibrillator to possibly prevent sudden cardiac death.  HOME CARE INSTRUCTIONS   Activity level. Your caregiver can help you determine what type of exercise program may be helpful. It is important to maintain your strength. Pace your physical  activity to avoid shortness of breath or chest pain. Rest for 1 hour before and after meals. A cardiac rehabilitation program may be helpful to some people with HF.   Diet. Eat a heart healthy diet. Food choices should be low in saturated fat and cholesterol. Talk to a dietician to learn about heart healthy foods.   Salt intake. When you have HF, you need to limit the amount of salt you eat. Eat less than 1500 milligrams (mg) of salt per day or as  recommended by your caregiver.   Weight monitoring. Weigh yourself every day. You should weigh yourself in the morning after you urinate and before you eat breakfast. Wear the same amount of clothing each time you weigh yourself. Record your weight daily. Bring your recorded weights to your clinic visits. Tell your caregiver right away if you have gained 3 lb/1.4 kg in 1 day, or 5 lb/2.3 kg in a week or whatever amount you were told to report.   Blood pressure monitoring. This should be done as directed by your caregiver. A home blood pressure cuff can be purchased at a drugstore. Record your blood pressure numbers and bring them to your clinic visits. Tell your caregiver if you become dizzy or lightheaded upon standing up.   Smoking. If you are currently a smoker, it is time to quit. Nicotine makes your heart work harder by causing your blood vessels to constrict. Do not use nicotine gum or patches before talking to your caregiver.   Follow up. Be sure to schedule a follow-up visit with your caregiver. Keep all your appointments.  SEEK MEDICAL CARE IF:   Your weight increases by 3 lb/1.4 kg in 1 day or 5 lb/2.3 kg in a week.   You notice increasing shortness of breath that is unusual for you. This may happen during rest, sleep, or with activity.   You cough more than normal, especially with physical activity.   You notice more swelling in your hands, feet, ankles, or belly (abdomen).   You are unable to sleep because it is hard to breathe.   You  cough up bloody mucus (sputum).   You begin to feel "jumping" or "fluttering" sensations (palpitations) in your chest.  SEEK IMMEDIATE MEDICAL CARE IF:   You have severe chest pain or pressure which may include symptoms such as:   Pain or pressure in the arms, neck, jaw, or back.   Feeling sweaty.   Feeling sick to your stomach (nauseous).   Feeling short of breath while at rest.   Having a fast or irregular heartbeat.   You experience stroke symptoms. These symptoms include:   Facial weakness or numbness.   Weakness or numbness in an arm, leg, or on one side of your body.   Blurred vision.   Difficulty talking or thinking.   Dizziness or fainting.   Severe headache.  THESE ARE MEDICAL EMERGENCIES. Do not wait to see if the symptoms go away. Call your local emergency services (911 in [place] DO NOT drive yourself to the hospital. IMPORTANT  Make a list of every medicine, vitamin, or herbal supplement you are taking. Keep the list with you at all times. Show it to your caregiver at every visit. Keep the list up-to-date.   Ask your caregiver or pharmacist to write an explanation of each medicine you are taking. This should include:   Why you are taking it.   The possible side effects.   The best time of day to take it.   Foods to take with it or what foods to avoid.   When to stop taking it.  MAKE SURE YOU:   Understand these instructions.   Will watch your condition.   Will get help right away if you are not doing well or get worse.  Document Released: 05/24/2005 Document Revised: 05/13/2011 Document Reviewed: 09/05/2009 [facility] Patient Information 2012 [place].  Bronchospasm A bronchospasm is when the tubes that carry air in and out of your lungs (bronchioles) become smaller. It  is hard to breathe when this happens. A bronchospasm can be caused by:  Asthma.   Allergies.   Lung infection.  HOME CARE   Do not  smoke. Avoid places that have  secondhand smoke.   Dust your house often. Have your air ducts cleaned once or twice a year.   Find out what allergies may cause your bronchospasms.   Use your inhaler properly if you have one. Know when to use it.   Eat healthy foods and drink plenty of water.   Only take medicine as told by your doctor.  GET HELP RIGHT AWAY IF:  You feel you cannot breathe or catch your breath.   You cannot stop coughing.   Your treatment is not helping you breathe better.  MAKE SURE YOU:   Understand these instructions.   Will watch your condition.   Will get help right away if you are not doing well or get worse.  Document Released: 03/21/2009 Document Revised: 05/13/2011 Document Reviewed: 03/21/2009 [facility] Patient Information 2012 [place].  Albuterol inhalation aerosol What is this medicine? ALBUTEROL (al [place]) is a bronchodilator. It helps open up the airways in your lungs to make it easier to breathe. This medicine is used to treat and to prevent bronchospasm. This medicine may be used for other purposes; ask your health care provider or pharmacist if you have questions. What should I tell my health care provider before I take this medicine? They need to know if you have any of the following conditions: -diabetes -heart disease or irregular heartbeat -high blood pressure -pheochromocytoma -seizures -thyroid disease -an unusual or allergic reaction to albuterol, levalbuterol, sulfites, other medicines, foods, dyes, or preservatives -pregnant or trying to get pregnant -breast-feeding How should I use this medicine? This medicine is for inhalation through the mouth. Follow the directions on your prescription label. Take your medicine at regular intervals. Do not use more often than directed. Make sure that you are using your inhaler correctly. Ask you doctor or health care provider if you have any questions. Use this medicine before you use any other inhaler. Wait 5  minutes or more before between using different inhalers. Talk to your pediatrician regarding the use of this medicine in children. Special care may be needed. Overdosage: If you think you have taken too much of this medicine contact a poison control center or emergency room at once. NOTE: This medicine is only for you. Do not share this medicine with others. What if I miss a dose? If you miss a dose, use it as soon as you can. If it is almost time for your next dose, use only that dose. Do not use double or extra doses. What may interact with this medicine? -anti-infectives like chloroquine and pentamidine -caffeine -cisapride -diuretics -medicines for colds -medicines for depression or for emotional or psychotic conditions -medicines for weight loss including some herbal products -methadone -some antibiotics like clarithromycin, erythromycin, levofloxacin, and linezolid -some heart medicines -steroid hormones like dexamethasone, cortisone, hydrocortisone -theophylline -thyroid hormones This list may not describe all possible interactions. Give your health care provider a list of all the medicines, herbs, non-prescription drugs, or dietary supplements you use. Also tell them if you smoke, drink alcohol, or use illegal drugs. Some items may interact with your medicine. What should I watch for while using this medicine? Tell your doctor or health care professional if your symptoms do not improve. Do not use extra albuterol. If your asthma or bronchitis gets worse while  you are using this medicine, call your doctor right away. If your mouth gets dry try chewing sugarless gum or sucking hard candy. Drink water as directed. What side effects may I notice from receiving this medicine? Side effects that you should report to your doctor or health care professional as soon as possible: -allergic reactions like skin rash, itching or hives, swelling of the face, lips, or tongue -breathing  problems -chest pain -feeling faint or lightheaded, falls -high blood pressure -irregular heartbeat -fever -muscle cramps or weakness -pain, tingling, numbness in the hands or feet -vomiting Side effects that usually do not require medical attention (report to your doctor or health care professional if they continue or are bothersome): -cough -difficulty sleeping -headache -nervousness or trembling -stomach upset -stuffy or runny nose -throat irritation -unusual taste This list may not describe all possible side effects. Call your doctor for medical advice about side effects. You may report side effects to FDA at 1-800-FDA-1088. Where should I keep my medicine? Keep out of the reach of children. Store at room temperature between 15 and 30 degrees C (59 and 86 degrees F). The contents are under pressure and may burst when exposed to heat or flame. Do not freeze. This medicine does not work as well if it is too cold. Throw away any unused medicine after the expiration date. Inhalers need to be thrown away after the labeled number of puffs have been used or by the expiration date; whichever comes first. Ventolin HFA should be thrown away 12 months after removing from foil pouch. Check the instructions that come with your medicine. NOTE: This sheet is a summary. It may not cover all possible information. If you have questions about this medicine, talk to your doctor, pharmacist, or health care provider.  2012, [employer]. (10/09/2010 11:00:52 AM)  Furosemide tablets What is this medicine? FUROSEMIDE (fyoor [place] se mide) is a diuretic. It helps you make more urine and to lose salt and excess water from your body. This medicine is used to treat high blood pressure, and edema or swelling from heart, kidney, or liver disease. This medicine may be used for other purposes; ask your health care provider or pharmacist if you have questions. What should I tell my health care provider before  I take this medicine? They need to know if you have any of these conditions: -abnormal blood electrolytes -diarrhea or vomiting -gout -heart disease -kidney disease, small amounts of urine, or difficulty passing urine -liver disease -an unusual or allergic reaction to furosemide, sulfa drugs, other medicines, foods, dyes, or preservatives -pregnant or trying to get pregnant -breast-feeding How should I use this medicine? Take this medicine by mouth with a glass of water. Follow the directions on the prescription label. You may take this medicine with or without food. If it upsets your stomach, take it with food or milk. Do not take your medicine more often than directed. Remember that you will need to pass more urine after taking this medicine. Do not take your medicine at a time of day that will cause you problems. Do not take at bedtime. Talk to your pediatrician regarding the use of this medicine in children. While this drug may be prescribed for selected conditions, precautions do apply. Overdosage: If you think you have taken too much of this medicine contact a poison control center or emergency room at once. NOTE: This medicine is only for you. Do not share this medicine with others. What if I miss a dose? If  you miss a dose, take it as soon as you can. If it is almost time for your next dose, take only that dose. Do not take double or extra doses. What may interact with this medicine? -aspirin and aspirin-like medicines -certain antibiotics -chloral hydrate -cisplatin -cyclosporine -digoxin -diuretics -laxatives -lithium -medicines for blood pressure -medicines that relax muscles for surgery -methotrexate -NSAIDs, medicines for pain and inflammation like ibuprofen, naproxen, or indomethacin -phenytoin -steroid medicines like prednisone or cortisone -sucralfate This list may not describe all possible interactions. Give your health care provider a list of all the medicines,  herbs, non-prescription drugs, or dietary supplements you use. Also tell them if you smoke, drink alcohol, or use illegal drugs. Some items may interact with your medicine. What should I watch for while using this medicine? Visit your doctor or health care professional for regular checks on your progress. Check your blood pressure regularly. Ask your doctor or health care professional what your blood pressure should be, and when you should contact him or her. If you are a diabetic, check your blood sugar as directed. You may need to be on a special diet while taking this medicine. Check with your doctor. Also, ask how many glasses of fluid you need to drink a day. You must not get dehydrated. You may get drowsy or dizzy. Do not drive, use machinery, or do anything that needs mental alertness until you know how this drug affects you. Do not stand or sit up quickly, especially if you are an older patient. This reduces the risk of dizzy or fainting spells. Alcohol can make you more drowsy and dizzy. Avoid alcoholic drinks. This medicine can make you more sensitive to the sun. Keep out of the sun. If you cannot avoid being in the sun, wear protective clothing and use sunscreen. Do not use sun lamps or tanning beds/booths. What side effects may I notice from receiving this medicine? Side effects that you should report to your doctor or health care professional as soon as possible: -blood in urine or stools -dry mouth -fever or chills -hearing loss or ringing in the ears -irregular heartbeat -muscle pain or weakness, cramps -skin rash -stomach upset, pain, or nausea -tingling or numbness in the hands or feet -unusually weak or tired -vomiting or diarrhea -yellowing of the eyes or skin Side effects that usually do not require medical attention (report to your doctor or health care professional if they continue or are bothersome): -headache -loss of appetite -unusual bleeding or bruising This list  may not describe all possible side effects. Call your doctor for medical advice about side effects. You may report side effects to FDA at 1-800-FDA-1088. Where should I keep my medicine? Keep out of the reach of children. Store at room temperature between 15 and 30 degrees C (59 and 86 degrees F). Protect from light. Throw away any unused medicine after the expiration date. NOTE: This sheet is a summary. It may not cover all possible information. If you have questions about this medicine, talk to your doctor, pharmacist, or health care provider.  2012, [employer]. (05/12/2009 4:24:50 PM)

## 2011-12-07 NOTE — ED Provider Notes (Signed)
History  This chart was scribed for Delora Fuel, MD by Jenne Campus. This patient was seen in room APA18/AP[facility]18 and the patient's care was started at 9:28AM.  CSN: [id number]  Arrival date & time 12/07/11  0923   First MD Initiated Contact with Patient 12/07/11 [phone]      Chief Complaint  Patient presents with  . Chest Pain    The history is provided by the patient. No language interpreter was used.    [name] is a 78 y.o. female with a h/o A. Fib who presents to the Emergency Department complaining of 3 days of sudden onset, gradually improving, constant right-sided chest pain under the right breast that radiates to the right back with associated wheezing, SOB, diaphoresis and non-productive cough. Pt describes the chest pain as being a sharp and shooting sensation that is worse with movement, laying on the right-side and deep breathing. She reports that the chest pain is improved with laying. The wheezing is worse at night and the SOB is worse exertion, particularly with walking up steps. She reports taking tylenol with no improvement in her symptoms.  She denies smoking and alcohol use. Pt saw Dr. Nancy Nordmann today, urination at night  Dr. Gerarda Fraction is PCP. Cardiologist is Dr. Lattie Haw.  Past Medical History  Diagnosis Date  . Tachycardia-bradycardia syndrome     with pauses and syncope;PPM implantation 10/2008,negative stress nuclear study 03/2005  . Atrial fibrillation     persistant  . Mitral valve disease     Severe mitral annular calcification; mild to moderate stenosis-not clearly rheumatic  . Hypertension     heart disease diastolic dysfunction ;mild pulmonary edema in 2006;responded to diurectics ; LVH  . Chronic renal insufficiency     baseline creatine 1.4; 1.04 in 03/2010  . Hyperlipidemia   . Angioedema 08/2006  . Anemia     H/H of 11.3/36.7 in 12/2008 ;normal MCV; normal CBC in 2011  . Gout   . 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 Vertigo   . Shortness of breath   . Chronic bronchitis   . Blood transfusion   . DJD (degenerative joint disease)     hands  . Seizure disorder   . Diastolic dysfunction     with EF of 80%  . Nephrolithiasis   . Arthritis   . Stroke   . Retinal hemorrhage     Past Surgical History  Procedure Date  . Insert / replace / remove pacemaker by Dr. Lovena Le 11/2008    [place] chamber pacemaker 11/06/2008  . Total knee arthroplasty 04/2009    Right  . Joint replacement 2010    right   . Cataract extraction w/phaco 03/29/2011    Procedure: CATARACT EXTRACTION PHACO AND INTRAOCULAR LENS PLACEMENT (IOC);  Surgeon: Tonny Branch;  Location: [facility];  Service: Ophthalmology;  Laterality: Right;  CDE 12.62    Family History  Problem Relation Age of Onset  . Hypotension Neg Hx   . Anesthesia problems Neg Hx   . Pseudochol deficiency Neg Hx   . Malignant hyperthermia Neg Hx   . Cancer Other     unknown cancer    History  Substance Use Topics  . Smoking status: Never Smoker   . Smokeless tobacco: Never Used  . Alcohol Use: No  No OB history provided.  Review of Systems  Allergies  Review of patient's allergies indicates no known allergies.  Home Medications   Current Outpatient Rx  Name Route Sig Dispense Refill  . ACETAMINOPHEN 500 MG PO TABS Oral Take 500 mg by mouth every 6 (six) hours as needed. Pain     . ALBUTEROL SULFATE (2.5 MG/3ML) 0.083% IN NEBU Nebulization Take 2.5 mg by nebulization every 6 (six) hours as needed.     . ALLOPURINOL 100 MG PO TABS Oral Take 100 mg by mouth daily.      Marland Kitchen DILTIAZEM HCL ER BEADS 360 MG PO CP24  TAKE (1) CAPSULE BY MOUTH ONCE DAILY. 30 capsule 11  . DORZOLAMIDE HCL-TIMOLOL MAL 22.3-6.8 MG/ML OP SOLN Left Eye Place 1 drop into the left eye daily.      Marland Kitchen  MECLIZINE HCL 25 MG PO TABS Oral Take 25 mg by mouth 3 (three) times daily as needed. Dizziness     . PRAVACHOL 80 MG PO TABS  TAKE (1) TABLET BY MOUTH AT BEDTIME. 30 each 6  . RANITIDINE HCL 150 MG PO TABS Oral Take 150 mg by mouth at bedtime.     . WARFARIN SODIUM 5 MG PO TABS Oral Take 1 tablet (5 mg total) by mouth daily. 90 tablet 0    Triage Vitals: BP 197/93  Pulse 90  Temp 97.8 F (36.6 C) (Oral)  Resp 20  Ht 5\' 2"  (1.575 m)  Wt 200 lb (90.719 kg)  BMI 36.58 kg/m2  SpO2 100%  Physical Exam  Nursing note and vitals reviewed. Constitutional: She is oriented to person, place, and time. She appears well-developed and well-nourished. No distress.  HENT:  Head: Normocephalic and atraumatic.  Eyes: EOM are normal. No scleral icterus.  Neck: Neck supple. JVD (JVD present at 45 degrees) present. No tracheal deviation present.  Cardiovascular: Normal rate.  An irregular rhythm present.  Murmur heard.  Systolic murmur is present with a grade of 2/6       Injection murmur heard at the left sternal border   Pulmonary/Chest: Effort normal. No respiratory distress. She has wheezes (slight wheeze with forced exhalation ). She exhibits no tenderness.  Abdominal: Soft. There is no tenderness.  Musculoskeletal: Normal range of motion. She exhibits edema.       2+ pretibial edema  Neurological: She is alert and oriented to person, place, and time.  Skin: Skin is warm and dry.  Psychiatric: She has a normal mood and affect. Her behavior is normal.    ED Course  Procedures (including critical care time)  DIAGNOSTIC STUDIES: Oxygen Saturation is 100% on room air, normal by my interpretation.    COORDINATION OF CARE: 9:43AM-Informed pt of EKG show A. Fib but no new changes. Discussed the treatment plan which includes a breathing treatment, blood work, and chest x-ray with pt and pt agreed to plan.    Results for orders placed during the hospital encounter of 12/07/11  CBC WITH  DIFFERENTIAL      Component Value Range   WBC 6.4  4.0 - 10.5 K/uL   RBC 4.72  3.87 - 5.11 MIL/uL   Hemoglobin 14.5  12.0 - 15.0 g/dL   HCT 43.8  36.0 - 46.0 %   MCV 92.8  78.0 - 100.0 fL   MCH 30.7  26.0 - 34.0 pg   MCHC 33.1  30.0 - 36.0 g/dL   RDW 13.4  11.5 - 15.5 %   Platelets 166  150 -  400 K/uL   Neutrophils Relative 55  43 - 77 %   Neutro Abs 3.5  1.7 - 7.7 K/uL   Lymphocytes Relative 34  12 - 46 %   Lymphs Abs 2.2  0.7 - 4.0 K/uL   Monocytes Relative 10  3 - 12 %   Monocytes Absolute 0.6  0.1 - 1.0 K/uL   Eosinophils Relative 1  0 - 5 %   Eosinophils Absolute 0.1  0.0 - 0.7 K/uL   Basophils Relative 0  0 - 1 %   Basophils Absolute 0.0  0.0 - 0.1 K/uL  BASIC METABOLIC PANEL      Component Value Range   Sodium 137  135 - 145 mEq/L   Potassium 4.0  3.5 - 5.1 mEq/L   Chloride 100  96 - 112 mEq/L   CO2 25  19 - 32 mEq/L   Glucose, Bld 122 (*) 70 - 99 mg/dL   BUN 14  6 - 23 mg/dL   Creatinine, Ser 1.00  0.50 - 1.10 mg/dL   Calcium 10.4  8.4 - 10.5 mg/dL   GFR calc non Af Amer 51 (*) >90 mL/min   GFR calc Af Amer 59 (*) >90 mL/min  PROTIME-INR      Component Value Range   Prothrombin Time 21.5 (*) 11.6 - 15.2 seconds   INR 1.83 (*) 0.00 - 1.49  PRO B NATRIURETIC PEPTIDE      Component Value Range   Pro B Natriuretic peptide (BNP) 1574.0 (*) 0 - 450 pg/mL   Dg Chest 2 View  12/07/2011  *RADIOLOGY REPORT*  Clinical Data: Right-sided chest pain.  CHEST - 2 VIEW  Comparison: Portable chest x-ray 04/29/2009.  Two-view chest x-ray 11/07/2008.  Findings: Cardiac silhouette enlarged but stable.  Thoracic aorta mildly atherosclerotic, unchanged.  Hilar and mediastinal contours otherwise unremarkable.  Left subclavian dual lead transvenous pacemaker unchanged and appears intact.  Pleuroparenchymal scarring at the right lung base.  Lungs otherwise clear.  No pleural effusions.  Pulmonary vascularity normal.  Mitral annular calcification.  Degenerative changes involving the thoracic  spine.  IMPRESSION: Stable cardiomegaly.  Pleuroparenchymal scarring at the right base. No acute cardiopulmonary disease.  Original Report Authenticated By: Deniece Portela, M.D.      Date: 12/07/2011  Rate: 85  Rhythm: atrial fibrillation  QRS Axis: normal  Intervals: normal  ST/T Wave abnormalities: normal  Conduction Disutrbances:none  Narrative Interpretation: Atrial fibrillation, low voltage, old anteroseptal myocardial infarction, old inferior wall myocardial infarction. When compared with ECG of 03/22/2011, no significant changes are seen.  Old EKG Reviewed: unchanged   1. CHF (congestive heart failure)   2. Wheezing   3. Atypical chest pain       MDM  Chest pain which is very atypical for cardiac disease. She is on therapeutic anticoagulation so pulmonary embolism is very unlikely. Pain is most likely related to costochondritis. Nocturnal wheezing could be from congestive heart failure. She does show some signs of congestive heart failure with neck vein distention and peripheral edema. BNP will be checked.   BNP is significantly elevated confirming congestive heart failure. She got significant subjective relief with albuterol, so she will be sent home with a prescription for albuterol inhaler as well as a prescription for furosemide. She's not had any further chest pain since arriving in the ED.   I personally performed the services described in this documentation, which was scribed in my presence. The recorded information has been reviewed and considered.  Delora Fuel, MD [id number]

## 2011-12-07 NOTE — ED Notes (Signed)
Patient with no complaints at this time. Respirations even and unlabored. Skin warm/dry. Discharge instructions reviewed with patient at this time. Patient given opportunity to voice concerns/ask questions. IV removed per policy and band-aid applied to site. Patient discharged at this time and left Emergency Department with steady gait.  

## 2011-12-08 ENCOUNTER — Ambulatory Visit (INDEPENDENT_AMBULATORY_CARE_PROVIDER_SITE_OTHER): Payer: Medicare Other | Admitting: *Deleted

## 2011-12-08 DIAGNOSIS — I639 Cerebral infarction, unspecified: Secondary | ICD-10-CM

## 2011-12-08 DIAGNOSIS — Z7901 Long term (current) use of anticoagulants: Secondary | ICD-10-CM

## 2011-12-08 DIAGNOSIS — I4891 Unspecified atrial fibrillation: Secondary | ICD-10-CM

## 2011-12-08 DIAGNOSIS — I495 Sick sinus syndrome: Secondary | ICD-10-CM

## 2011-12-08 DIAGNOSIS — I6350 Cerebral infarction due to unspecified occlusion or stenosis of unspecified cerebral artery: Secondary | ICD-10-CM

## 2011-12-31 ENCOUNTER — Encounter: Payer: Self-pay | Admitting: *Deleted

## 2012-01-05 ENCOUNTER — Ambulatory Visit (INDEPENDENT_AMBULATORY_CARE_PROVIDER_SITE_OTHER): Payer: Medicare Other | Admitting: *Deleted

## 2012-01-05 DIAGNOSIS — I495 Sick sinus syndrome: Secondary | ICD-10-CM

## 2012-01-05 DIAGNOSIS — I639 Cerebral infarction, unspecified: Secondary | ICD-10-CM

## 2012-01-05 DIAGNOSIS — Z7901 Long term (current) use of anticoagulants: Secondary | ICD-10-CM

## 2012-01-05 DIAGNOSIS — I4891 Unspecified atrial fibrillation: Secondary | ICD-10-CM

## 2012-01-05 DIAGNOSIS — I6350 Cerebral infarction due to unspecified occlusion or stenosis of unspecified cerebral artery: Secondary | ICD-10-CM

## 2012-01-05 LAB — POCT INR: INR: 3

## 2012-01-19 ENCOUNTER — Ambulatory Visit (INDEPENDENT_AMBULATORY_CARE_PROVIDER_SITE_OTHER): Payer: Medicare Other | Admitting: *Deleted

## 2012-01-19 DIAGNOSIS — I4891 Unspecified atrial fibrillation: Secondary | ICD-10-CM

## 2012-01-19 DIAGNOSIS — I495 Sick sinus syndrome: Secondary | ICD-10-CM

## 2012-01-19 DIAGNOSIS — Z7901 Long term (current) use of anticoagulants: Secondary | ICD-10-CM

## 2012-01-19 DIAGNOSIS — I6350 Cerebral infarction due to unspecified occlusion or stenosis of unspecified cerebral artery: Secondary | ICD-10-CM

## 2012-01-19 DIAGNOSIS — I639 Cerebral infarction, unspecified: Secondary | ICD-10-CM

## 2012-01-19 LAB — POCT INR: INR: 2.9

## 2012-02-23 ENCOUNTER — Ambulatory Visit (INDEPENDENT_AMBULATORY_CARE_PROVIDER_SITE_OTHER): Payer: Medicare Other | Admitting: *Deleted

## 2012-02-23 DIAGNOSIS — I6350 Cerebral infarction due to unspecified occlusion or stenosis of unspecified cerebral artery: Secondary | ICD-10-CM

## 2012-02-23 DIAGNOSIS — I639 Cerebral infarction, unspecified: Secondary | ICD-10-CM

## 2012-02-23 DIAGNOSIS — I4891 Unspecified atrial fibrillation: Secondary | ICD-10-CM

## 2012-02-23 DIAGNOSIS — I495 Sick sinus syndrome: Secondary | ICD-10-CM

## 2012-02-23 DIAGNOSIS — Z7901 Long term (current) use of anticoagulants: Secondary | ICD-10-CM

## 2012-02-23 LAB — POCT INR: INR: 2.3

## 2012-03-06 ENCOUNTER — Ambulatory Visit (INDEPENDENT_AMBULATORY_CARE_PROVIDER_SITE_OTHER): Payer: Medicare Other | Admitting: *Deleted

## 2012-03-06 ENCOUNTER — Encounter: Payer: Self-pay | Admitting: Internal Medicine

## 2012-03-06 DIAGNOSIS — I495 Sick sinus syndrome: Secondary | ICD-10-CM

## 2012-03-06 DIAGNOSIS — I4891 Unspecified atrial fibrillation: Secondary | ICD-10-CM

## 2012-03-06 LAB — REMOTE PACEMAKER DEVICE
AL IMPEDENCE PM: 480 Ohm
BAMS-0001: 150 {beats}/min
BAMS-0003: 70 {beats}/min
BRDY-0002RV: 60 {beats}/min
DEVICE MODEL PM: 2323973
RV LEAD AMPLITUDE: 12 mv
RV LEAD IMPEDENCE PM: 430 Ohm

## 2012-03-07 NOTE — Progress Notes (Signed)
Remote pacer check  

## 2012-03-08 ENCOUNTER — Ambulatory Visit (INDEPENDENT_AMBULATORY_CARE_PROVIDER_SITE_OTHER): Payer: Medicare Other | Admitting: Cardiology

## 2012-03-08 ENCOUNTER — Encounter: Payer: Self-pay | Admitting: *Deleted

## 2012-03-08 ENCOUNTER — Encounter: Payer: Self-pay | Admitting: Cardiology

## 2012-03-08 VITALS — BP 168/90 | HR 87 | Ht 61.0 in | Wt 204.0 lb

## 2012-03-08 DIAGNOSIS — Z950 Presence of cardiac pacemaker: Secondary | ICD-10-CM

## 2012-03-08 DIAGNOSIS — I10 Essential (primary) hypertension: Secondary | ICD-10-CM

## 2012-03-08 DIAGNOSIS — Z7901 Long term (current) use of anticoagulants: Secondary | ICD-10-CM

## 2012-03-08 DIAGNOSIS — I4891 Unspecified atrial fibrillation: Secondary | ICD-10-CM

## 2012-03-08 DIAGNOSIS — I495 Sick sinus syndrome: Secondary | ICD-10-CM

## 2012-03-08 MED ORDER — LISINOPRIL 20 MG PO TABS: 20.0000 mg | ORAL_TABLET | Freq: Every day | ORAL | Status: DC

## 2012-03-08 NOTE — Assessment & Plan Note (Signed)
Explanation for recent dizziness is unclear.  No significant orthostatic change in blood pressure noted.

## 2012-03-08 NOTE — Assessment & Plan Note (Signed)
Blood pressure control has been suboptimal.  Lisinopril will be added to this nice woman's medical regime with subsequent monitoring of renal function and electrolytes.

## 2012-03-08 NOTE — Assessment & Plan Note (Signed)
Anticoagulation has been stable, therapeutic and apparently effective.  No evidence for chronic GI blood loss.  We will continue to monitor with CBCs and periodic stool Hemoccult testing.

## 2012-03-08 NOTE — Assessment & Plan Note (Signed)
Control of ventricular rate is good.  Patient does not appear to be experiencing problems attributed to her arrhythmia.  We will continue our strategy of rate control and anticoagulation.

## 2012-03-08 NOTE — Progress Notes (Signed)
Patient ID: [name], female   DOB: [birth year], 79 y.o.   MRN: [id number]  HPI: Scheduled return visit for this lovely older woman with atrial fibrillation.  Since her last visit, she has not required urgent medical evaluation nor hospitalization.  She has chronic class II dyspnea on exertion and minimal, if any, chest discomfort.  She occasionally notes dizziness, most recently when she arose suddenly from a chair.  The exact duration of the symptoms is not clear.  It does not sound as if they represent vertigo.  Prior to Admission medications   Medication Sig Start Date End Date Taking? Authorizing Provider  acetaminophen (TYLENOL) 500 MG tablet Take 500 mg by mouth every 6 (six) hours as needed. Pain    Yes Historical Provider, MD  albuterol (PROVENTIL HFA;VENTOLIN HFA) 108 (90 BASE) MCG/ACT inhaler Inhale 2 puffs into the lungs every 4 (four) hours as needed for wheezing or shortness of breath. 12/07/11 0000000 Yes Delora Fuel, MD  allopurinol (ZYLOPRIM) 100 MG tablet Take 100 mg by mouth daily.     Yes Historical Provider, MD  diltiazem (TIAZAC) 360 MG 24 hr capsule Take 360 mg by mouth daily.   Yes Historical Provider, MD  dorzolamide-timolol (COSOPT) 22.3-6.8 MG/ML ophthalmic solution Place 1 drop into the left eye at bedtime.    Yes Historical Provider, MD  furosemide (LASIX) 40 MG tablet Take 1 tablet (40 mg total) by mouth daily. 12/07/11 0000000 Yes Delora Fuel, MD  meclizine (ANTIVERT) 25 MG tablet Take 25 mg by mouth 3 (three) times daily as needed. Dizziness 11/11/10  Yes Imogene Burn, PA  pravastatin (PRAVACHOL) 80 MG tablet Take 80 mg by mouth at bedtime.   Yes Historical Provider, MD  ranitidine (ZANTAC) 150 MG tablet Take 150 mg by mouth at bedtime.    Yes Historical Provider, MD  warfarin (COUMADIN) 5 MG tablet Take 2.5 mg by mouth daily. 11/17/11  Yes Yehuda Savannah, MD  lisinopril (PRINIVIL,ZESTRIL) 20 MG tablet Take 1 tablet (20 mg total) by mouth daily. 03/08/12   Yehuda Savannah, MD  No Known Allergies    Past medical history, social history, and family history reviewed and updated.  ROS: Denies orthopnea, PND, syncope, palpitations, sputum production, history of pulmonary problems.  All other systems reviewed and are negative.  PHYSICAL EXAM: BP 168/90  Pulse 87  Ht 5\' 1"  (1.549 m)  Wt 92.534 kg (204 lb)  BMI 38.55 kg/m2  SpO2 97%  General-Well developed; no acute distress Body habitus-Moderately overweight Neck-No JVD; no carotid bruits Lungs-clear lung fields; resonant to percussion Cardiovascular-normal PMI; normal S1 and S2; minimal systolic ejection murmur at the cardiac base Abdomen-normal bowel sounds; soft and non-tender without masses or organomegaly Musculoskeletal-No deformities, no cyanosis or clubbing Neurologic-Normal cranial nerves; symmetric strength and tone Skin-Warm, no significant lesions Extremities-distal pulses intact; Trace edema  Rhythm Strip: Atrial fibrillation with controlled ventricular response; heart rate of 89 bpm  ASSESSMENT AND PLAN:  Jacqulyn Ducking, MD 03/08/2012 1:04 PM

## 2012-03-08 NOTE — Patient Instructions (Addendum)
Your physician recommends that you schedule a follow-up appointment in: 9 months  Your physician recommends that you return for lab work in: 1 month (you will receive a letter)  Your physician has requested that you regularly monitor and record your blood pressure readings at home. Please use the same machine at the same time of day to check your readings and record them to bring to your follow-up visit.  Your physician has recommended you make the following change in your medication:  1 - START Lisinopril 20 mg daily  Stool Cards x 3 and return to office

## 2012-03-08 NOTE — Progress Notes (Deleted)
Name: [name]    DOB: 08-05-1926  Age: 78 y.o.  MR#: [id number]       PCP:  Leonides Grills, MD      Insurance: @PAYORNAME @   CC:   No chief complaint on file.   VS BP 168/90  Pulse 87  Ht 5\' 1"  (1.549 m)  Wt 204 lb (92.534 kg)  BMI 38.55 kg/m2  SpO2 97%  Weights Current Weight  03/08/12 204 lb (92.534 kg)  12/07/11 200 lb (90.719 kg)  09/09/11 202 lb (91.627 kg)    Blood Pressure  BP Readings from Last 3 Encounters:  03/08/12 168/90  12/07/11 146/67  09/09/11 140/63     Admit date:  (Not on file) Last encounter with RMR:  11/08/2011   Allergy No Known Allergies  Current Outpatient Prescriptions  Medication Sig Dispense Refill  . acetaminophen (TYLENOL) 500 MG tablet Take 500 mg by mouth every 6 (six) hours as needed. Pain       . albuterol (PROVENTIL HFA;VENTOLIN HFA) 108 (90 BASE) MCG/ACT inhaler Inhale 2 puffs into the lungs every 4 (four) hours as needed for wheezing or shortness of breath.  1 Inhaler  0  . allopurinol (ZYLOPRIM) 100 MG tablet Take 100 mg by mouth daily.        Marland Kitchen diltiazem (TIAZAC) 360 MG 24 hr capsule Take 360 mg by mouth daily.      . dorzolamide-timolol (COSOPT) 22.3-6.8 MG/ML ophthalmic solution Place 1 drop into the left eye at bedtime.       . furosemide (LASIX) 40 MG tablet Take 1 tablet (40 mg total) by mouth daily.  30 tablet  0  . meclizine (ANTIVERT) 25 MG tablet Take 25 mg by mouth 3 (three) times daily as needed. Dizziness      . pravastatin (PRAVACHOL) 80 MG tablet Take 80 mg by mouth at bedtime.      . ranitidine (ZANTAC) 150 MG tablet Take 150 mg by mouth at bedtime.       Marland Kitchen warfarin (COUMADIN) 5 MG tablet Take 2.5 mg by mouth daily.      Marland Kitchen DISCONTD: colchicine 0.6 MG tablet Take 0.6 mg by mouth daily as needed. Gout Flare Ups      . DISCONTD: diltiazem (CARDIZEM CD) 360 MG 24 hr capsule TAKE (1) CAPSULE BY MOUTH ONCE DAILY.  30 capsule  5  . DISCONTD: simvastatin (ZOCOR) 40 MG tablet Take 40 mg by mouth at bedtime.           Discontinued Meds:   There are no discontinued medications.  Patient Active Problem List  Diagnosis  . Hyperlipidemia  . Hypertension  . Sick sinus syndrome  . SEIZURE DISORDER  . ANGIOEDEMA  . Cerebrovascular accident  . DJD (degenerative joint disease)  . Vertigo  . PPM-St.Jude  . Chronic anticoagulation  . Atrial fibrillation    LABS Clinical Support on 03/06/2012  Component Date Value  . DEVICE MODEL PM 03/06/2012 [id number]   . [id number] 03/06/2012 Allred, Jeneen Rinks      . DEV-0014LDO 03/06/2012 Allred, Jeneen Rinks      . [id number] 03/06/2012 [employer] Monitoring System   . [id number] 03/06/2012                     Value:Pacemaker remote check. Device function reviewed. Impedance, sensing, auto capture thresholds consistent with previous measurements. Histograms appropriate for patient and level of activity. All other diagnostic data reviewed and is appropriate and  stable for patient. Real time/magnet EGM shows appropriate sensing and capture. 100% A-fib with 1 VHR, + coumadin. Atrial noise reversions are A-fib.  Atrial impedance stable.    Plan to follow in 3 months remotely, to see in office annually.  Next                          remote 06/12/12.  . ATRIAL PACING PM 03/06/2012 0   . VENTRICULAR PACING PM 03/06/2012 54   . BATTERY VOLTAGE 03/06/2012 2.95   . AL IMPEDENCE PM 03/06/2012 480   . RV LEAD IMPEDENCE PM 03/06/2012 430   . RV LEAD AMPLITUDE 03/06/2012 12   . BRDY-0001RV 03/06/2012 DDD   . BRDY-0002RV 03/06/2012 60   . BRDY-0003RV 03/06/2012 120   . BRDY-0004RV 03/06/2012 120   . BRDY-0005RV 03/06/2012 Off   . BRDY-0007RV 03/06/2012 Atrial Pace   . BRDY-0008RV 03/06/2012 Atrial Pace   . BRDY-0009RV 03/06/2012 No   . BRDY-0010RV 03/06/2012 V.Safety=On   . BAMS-0001 03/06/2012 150   . BAMS-0003 03/06/2012 70   Anti-coag visit on 02/23/2012  Component Date Value  . INR 02/23/2012 2.3   Anti-coag visit on 01/19/2012  Component  Date Value  . INR 01/19/2012 2.9   Anti-coag visit on 01/05/2012  Component Date Value  . INR 01/05/2012 3.0   Anti-coag visit on 12/08/2011  Component Date Value  . INR 12/08/2011 2.0      Results for this Opt Visit:     Results for orders placed in visit on 03/06/12  REMOTE PACEMAKER DEVICE      Component Value Range   DEVICE MODEL PM [id number]     DEV-0014LDO Allred, James        DEV-0014LDO Sheryle Spray [employer] Medical Monitoring System     [phone]       Value: Pacemaker remote check. Device function reviewed. Impedance, sensing, auto capture thresholds consistent with previous measurements. Histograms appropriate for patient and level of activity. All other diagnostic data reviewed and is appropriate and      stable for patient. Real time/magnet EGM shows appropriate sensing and capture. 100% A-fib with 1 VHR, + coumadin. Atrial noise reversions are A-fib.  Atrial impedance stable.    Plan to follow in 3 months remotely, to see in office annually.  Next      remote 06/12/12.   ATRIAL PACING PM 0     VENTRICULAR PACING PM 54     BATTERY VOLTAGE 2.95     AL IMPEDENCE PM 480     RV LEAD IMPEDENCE PM 430     RV LEAD AMPLITUDE 12     BRDY-0001RV DDD     BRDY-0002RV 60     BRDY-0003RV 120     BRDY-0004RV 120     BRDY-0005RV Off     BRDY-0007RV Atrial Pace     BRDY-0008RV Atrial Pace     BRDY-0009RV No     BRDY-0010RV V.Safety=On     BAMS-0001 150     BAMS-0003 70      EKG Orders placed during the hospital encounter of 12/07/11  . ED EKG  . ED EKG  . EKG 12-LEAD  . EKG 12-LEAD  . EKG     Prior Assessment and Plan Problem List as of 03/08/2012            Cardiology Problems   Hyperlipidemia   Last Assessment & Plan Note  09/09/2011 Office Visit Addendum 09/09/2011  2:44 PM by Yehuda Savannah, MD    Lipid profile remains excellent with current therapy, which will be continued.    Hypertension   Last Assessment & Plan Note   09/09/2011 Office  Visit Addendum 09/13/2011  6:21 PM by Yehuda Savannah, MD    Blood pressure control is slightly suboptimal at this visit.  Patient advised to attempt to lose the 16 pounds she has gained, which will likely result in normalization of mildly elevated systolic blood pressure.    Sick sinus syndrome   Last Assessment & Plan Note   11/11/2010 Office Visit Signed 11/11/2010 12:25 PM by Imogene Burn, PA    Patient's atrial fibrillation is controlled. Her pacemaker was checked in April and stable.    Cerebrovascular accident   Atrial fibrillation   Last Assessment & Plan Note   09/09/2011 Office Visit Signed 09/09/2011  2:39 PM by Yehuda Savannah, MD    Strategy of rate control, which has been excellent, and chronic anticoagulation will be continued.      Other   SEIZURE DISORDER   ANGIOEDEMA   DJD (degenerative joint disease)   Vertigo   Last Assessment & Plan Note   09/09/2011 Office Visit Signed 09/09/2011  2:42 PM by Yehuda Savannah, MD    Presumed inner ear etiology.  Patient does well with as needed meclizine.    PPM-St.Jude   Last Assessment & Plan Note   08/31/2011 Office Visit Signed 08/31/2011 11:24 AM by Evans Lance, MD    Her device is working normally today. We'll plan to recheck in several months.    Chronic anticoagulation   Last Assessment & Plan Note   09/09/2011 Office Visit Signed 09/09/2011  2:40 PM by Yehuda Savannah, MD    Never underwent colonoscopy; normal CBC in 08/2011.  Stool specimens for Hemoccult testing had been requested.        Imaging: No results found.   FRS Calculation: Score not calculated

## 2012-03-15 ENCOUNTER — Encounter: Payer: Self-pay | Admitting: *Deleted

## 2012-04-05 ENCOUNTER — Other Ambulatory Visit: Payer: Self-pay | Admitting: Cardiology

## 2012-04-05 ENCOUNTER — Ambulatory Visit (INDEPENDENT_AMBULATORY_CARE_PROVIDER_SITE_OTHER): Payer: Medicare Other | Admitting: *Deleted

## 2012-04-05 DIAGNOSIS — I4891 Unspecified atrial fibrillation: Secondary | ICD-10-CM

## 2012-04-05 DIAGNOSIS — I495 Sick sinus syndrome: Secondary | ICD-10-CM

## 2012-04-05 DIAGNOSIS — I639 Cerebral infarction, unspecified: Secondary | ICD-10-CM

## 2012-04-05 DIAGNOSIS — I10 Essential (primary) hypertension: Secondary | ICD-10-CM

## 2012-04-05 DIAGNOSIS — Z7901 Long term (current) use of anticoagulants: Secondary | ICD-10-CM

## 2012-04-05 DIAGNOSIS — I6350 Cerebral infarction due to unspecified occlusion or stenosis of unspecified cerebral artery: Secondary | ICD-10-CM

## 2012-04-05 LAB — COMPREHENSIVE METABOLIC PANEL
ALT: 14 U/L (ref 0–35)
Alkaline Phosphatase: 73 U/L (ref 39–117)
Creat: 1.24 mg/dL — ABNORMAL HIGH (ref 0.50–1.10)
Sodium: 141 mEq/L (ref 135–145)
Total Bilirubin: 0.6 mg/dL (ref 0.3–1.2)
Total Protein: 6.9 g/dL (ref 6.0–8.3)

## 2012-04-05 LAB — POCT INR: INR: 2

## 2012-04-06 ENCOUNTER — Encounter: Payer: Self-pay | Admitting: Cardiology

## 2012-04-06 DIAGNOSIS — R7301 Impaired fasting glucose: Secondary | ICD-10-CM | POA: Insufficient documentation

## 2012-04-07 ENCOUNTER — Other Ambulatory Visit (INDEPENDENT_AMBULATORY_CARE_PROVIDER_SITE_OTHER): Payer: Medicare Other | Admitting: *Deleted

## 2012-04-07 DIAGNOSIS — I10 Essential (primary) hypertension: Secondary | ICD-10-CM

## 2012-04-07 DIAGNOSIS — Z7901 Long term (current) use of anticoagulants: Secondary | ICD-10-CM

## 2012-04-07 LAB — POC HEMOCCULT BLD/STL (HOME/3-CARD/SCREEN)
Card #2 Fecal Occult Blod, POC: NEGATIVE
Card #3 Fecal Occult Blood, POC: NEGATIVE

## 2012-04-13 ENCOUNTER — Encounter: Payer: Self-pay | Admitting: *Deleted

## 2012-04-13 NOTE — Addendum Note (Signed)
Addended by: Shara Blazing A on: 04/13/2012 09:38 AM   Modules accepted: Orders

## 2012-04-20 ENCOUNTER — Encounter: Payer: Self-pay | Admitting: *Deleted

## 2012-04-20 ENCOUNTER — Other Ambulatory Visit: Payer: Self-pay | Admitting: *Deleted

## 2012-04-20 DIAGNOSIS — I10 Essential (primary) hypertension: Secondary | ICD-10-CM

## 2012-04-25 LAB — BASIC METABOLIC PANEL
Chloride: 104 mEq/L (ref 96–112)
Creat: 1.39 mg/dL — ABNORMAL HIGH (ref 0.50–1.10)
Potassium: 4.6 mEq/L (ref 3.5–5.3)

## 2012-04-26 ENCOUNTER — Encounter: Payer: Self-pay | Admitting: Cardiology

## 2012-04-26 DIAGNOSIS — I1310 Hypertensive heart and chronic kidney disease without heart failure, with stage 1 through stage 4 chronic kidney disease, or unspecified chronic kidney disease: Secondary | ICD-10-CM | POA: Insufficient documentation

## 2012-05-03 ENCOUNTER — Other Ambulatory Visit: Payer: Self-pay | Admitting: *Deleted

## 2012-05-03 ENCOUNTER — Encounter: Payer: Self-pay | Admitting: *Deleted

## 2012-05-03 DIAGNOSIS — I10 Essential (primary) hypertension: Secondary | ICD-10-CM

## 2012-05-12 ENCOUNTER — Other Ambulatory Visit: Payer: Self-pay | Admitting: Cardiology

## 2012-05-13 LAB — COMPREHENSIVE METABOLIC PANEL
AST: 17 U/L (ref 0–37)
Albumin: 3.9 g/dL (ref 3.5–5.2)
Alkaline Phosphatase: 78 U/L (ref 39–117)
BUN: 14 mg/dL (ref 6–23)
Calcium: 9.9 mg/dL (ref 8.4–10.5)
Chloride: 106 mEq/L (ref 96–112)
Glucose, Bld: 110 mg/dL — ABNORMAL HIGH (ref 70–99)
Potassium: 5 mEq/L (ref 3.5–5.3)
Sodium: 146 mEq/L — ABNORMAL HIGH (ref 135–145)
Total Protein: 6.4 g/dL (ref 6.0–8.3)

## 2012-05-17 ENCOUNTER — Ambulatory Visit (INDEPENDENT_AMBULATORY_CARE_PROVIDER_SITE_OTHER): Payer: Medicare Other | Admitting: *Deleted

## 2012-05-17 DIAGNOSIS — I6350 Cerebral infarction due to unspecified occlusion or stenosis of unspecified cerebral artery: Secondary | ICD-10-CM

## 2012-05-17 DIAGNOSIS — Z7901 Long term (current) use of anticoagulants: Secondary | ICD-10-CM

## 2012-05-17 DIAGNOSIS — I639 Cerebral infarction, unspecified: Secondary | ICD-10-CM

## 2012-05-17 DIAGNOSIS — I4891 Unspecified atrial fibrillation: Secondary | ICD-10-CM

## 2012-05-17 DIAGNOSIS — I495 Sick sinus syndrome: Secondary | ICD-10-CM

## 2012-05-25 ENCOUNTER — Ambulatory Visit (INDEPENDENT_AMBULATORY_CARE_PROVIDER_SITE_OTHER): Payer: Medicare Other | Admitting: *Deleted

## 2012-05-25 DIAGNOSIS — Z7901 Long term (current) use of anticoagulants: Secondary | ICD-10-CM

## 2012-05-25 DIAGNOSIS — I6350 Cerebral infarction due to unspecified occlusion or stenosis of unspecified cerebral artery: Secondary | ICD-10-CM

## 2012-05-25 DIAGNOSIS — I495 Sick sinus syndrome: Secondary | ICD-10-CM

## 2012-05-25 DIAGNOSIS — I639 Cerebral infarction, unspecified: Secondary | ICD-10-CM

## 2012-05-25 DIAGNOSIS — I4891 Unspecified atrial fibrillation: Secondary | ICD-10-CM

## 2012-06-12 ENCOUNTER — Ambulatory Visit (INDEPENDENT_AMBULATORY_CARE_PROVIDER_SITE_OTHER): Payer: Medicare Other | Admitting: *Deleted

## 2012-06-12 ENCOUNTER — Encounter: Payer: Self-pay | Admitting: Internal Medicine

## 2012-06-12 DIAGNOSIS — I495 Sick sinus syndrome: Secondary | ICD-10-CM

## 2012-06-12 DIAGNOSIS — Z950 Presence of cardiac pacemaker: Secondary | ICD-10-CM

## 2012-06-13 LAB — REMOTE PACEMAKER DEVICE
AL IMPEDENCE PM: 530 Ohm
BAMS-0001: 150 {beats}/min
BATTERY VOLTAGE: 2.95 V
BRDY-0004RV: 120 {beats}/min
VENTRICULAR PACING PM: 57

## 2012-06-20 ENCOUNTER — Encounter: Payer: Self-pay | Admitting: *Deleted

## 2012-06-22 ENCOUNTER — Other Ambulatory Visit: Payer: Self-pay | Admitting: Cardiology

## 2012-06-22 ENCOUNTER — Ambulatory Visit (INDEPENDENT_AMBULATORY_CARE_PROVIDER_SITE_OTHER): Payer: Medicare Other | Admitting: *Deleted

## 2012-06-22 DIAGNOSIS — I4891 Unspecified atrial fibrillation: Secondary | ICD-10-CM

## 2012-06-22 DIAGNOSIS — I6350 Cerebral infarction due to unspecified occlusion or stenosis of unspecified cerebral artery: Secondary | ICD-10-CM

## 2012-06-22 DIAGNOSIS — I639 Cerebral infarction, unspecified: Secondary | ICD-10-CM

## 2012-06-22 DIAGNOSIS — Z7901 Long term (current) use of anticoagulants: Secondary | ICD-10-CM

## 2012-06-22 DIAGNOSIS — I495 Sick sinus syndrome: Secondary | ICD-10-CM

## 2012-06-23 LAB — COMPREHENSIVE METABOLIC PANEL
Alkaline Phosphatase: 79 U/L (ref 39–117)
BUN: 19 mg/dL (ref 6–23)
CO2: 29 mEq/L (ref 19–32)
Creat: 1.22 mg/dL — ABNORMAL HIGH (ref 0.50–1.10)
Glucose, Bld: 109 mg/dL — ABNORMAL HIGH (ref 70–99)
Sodium: 144 mEq/L (ref 135–145)
Total Bilirubin: 0.4 mg/dL (ref 0.3–1.2)
Total Protein: 6.9 g/dL (ref 6.0–8.3)

## 2012-06-27 ENCOUNTER — Encounter: Payer: Self-pay | Admitting: Cardiology

## 2012-06-28 ENCOUNTER — Other Ambulatory Visit: Payer: Self-pay | Admitting: *Deleted

## 2012-06-28 ENCOUNTER — Encounter: Payer: Self-pay | Admitting: *Deleted

## 2012-06-28 DIAGNOSIS — E875 Hyperkalemia: Secondary | ICD-10-CM

## 2012-07-24 ENCOUNTER — Other Ambulatory Visit: Payer: Self-pay | Admitting: Cardiology

## 2012-07-24 DIAGNOSIS — E875 Hyperkalemia: Secondary | ICD-10-CM

## 2012-07-25 LAB — BASIC METABOLIC PANEL
BUN: 21 mg/dL (ref 6–23)
Chloride: 104 mEq/L (ref 96–112)
Glucose, Bld: 109 mg/dL — ABNORMAL HIGH (ref 70–99)
Potassium: 4.6 mEq/L (ref 3.5–5.3)
Sodium: 140 mEq/L (ref 135–145)

## 2012-08-03 ENCOUNTER — Ambulatory Visit (INDEPENDENT_AMBULATORY_CARE_PROVIDER_SITE_OTHER): Payer: Medicare Other | Admitting: *Deleted

## 2012-08-03 DIAGNOSIS — I495 Sick sinus syndrome: Secondary | ICD-10-CM

## 2012-08-03 DIAGNOSIS — I6350 Cerebral infarction due to unspecified occlusion or stenosis of unspecified cerebral artery: Secondary | ICD-10-CM

## 2012-08-03 DIAGNOSIS — I4891 Unspecified atrial fibrillation: Secondary | ICD-10-CM

## 2012-08-03 DIAGNOSIS — I639 Cerebral infarction, unspecified: Secondary | ICD-10-CM

## 2012-08-03 DIAGNOSIS — Z7901 Long term (current) use of anticoagulants: Secondary | ICD-10-CM

## 2012-08-03 LAB — POCT INR: INR: 2.4

## 2012-08-17 ENCOUNTER — Encounter: Payer: Self-pay | Admitting: Internal Medicine

## 2012-08-17 ENCOUNTER — Ambulatory Visit (INDEPENDENT_AMBULATORY_CARE_PROVIDER_SITE_OTHER): Payer: Medicare Other | Admitting: Internal Medicine

## 2012-08-17 VITALS — BP 130/75 | HR 72 | Ht 62.0 in | Wt 201.0 lb

## 2012-08-17 DIAGNOSIS — Z950 Presence of cardiac pacemaker: Secondary | ICD-10-CM

## 2012-08-17 DIAGNOSIS — I4891 Unspecified atrial fibrillation: Secondary | ICD-10-CM

## 2012-08-17 DIAGNOSIS — I10 Essential (primary) hypertension: Secondary | ICD-10-CM

## 2012-08-17 LAB — PACEMAKER DEVICE OBSERVATION
BAVD-0004RV: 90 {beats}/min
BRDY-0003RV: 120 {beats}/min
BRDY-0004RV: 120 {beats}/min
RV LEAD AMPLITUDE: 12 mv
VENTRICULAR PACING PM: 61

## 2012-08-17 NOTE — Assessment & Plan Note (Signed)
Her ventricular rate is well controlled. She is pacing approximately 50% of the time. She will continue her current medical therapy.

## 2012-08-17 NOTE — Progress Notes (Signed)
HPI  [name] returns today for followup. She is a very pleasant 79 year old woman with a history of now chronic atrial fibrillation, symptomatic bradycardia, remote strokes, on warfarin, and obesity. In the interim, she has been stable. She denies peripheral edema, cough, or chest pain. No syncope. She has dyspnea with exertion. No Known Allergies   Current Outpatient Prescriptions  Medication Sig Dispense Refill  . acetaminophen (TYLENOL) 500 MG tablet Take 500 mg by mouth every 6 (six) hours as needed. Pain       . albuterol (PROVENTIL HFA;VENTOLIN HFA) 108 (90 BASE) MCG/ACT inhaler Inhale 2 puffs into the lungs every 4 (four) hours as needed for wheezing or shortness of breath.  1 Inhaler  0  . allopurinol (ZYLOPRIM) 100 MG tablet Take 100 mg by mouth daily.        Marland Kitchen diltiazem (TIAZAC) 360 MG 24 hr capsule Take 360 mg by mouth daily.      . dorzolamide-timolol (COSOPT) 22.3-6.8 MG/ML ophthalmic solution Place 1 drop into the left eye at bedtime.       . furosemide (LASIX) 40 MG tablet Take 1 tablet (40 mg total) by mouth daily.  30 tablet  0  . lisinopril (PRINIVIL,ZESTRIL) 20 MG tablet Take 1 tablet (20 mg total) by mouth daily.  30 tablet  11  . meclizine (ANTIVERT) 25 MG tablet Take 25 mg by mouth 3 (three) times daily as needed. Dizziness      . pravastatin (PRAVACHOL) 80 MG tablet Take 80 mg by mouth at bedtime.      . ranitidine (ZANTAC) 150 MG tablet Take 150 mg by mouth at bedtime.       Marland Kitchen warfarin (COUMADIN) 5 MG tablet Take 2.5 mg by mouth daily.      . [DISCONTINUED] colchicine 0.6 MG tablet Take 0.6 mg by mouth daily as needed. Gout Flare Ups      . [DISCONTINUED] diltiazem (CARDIZEM CD) 360 MG 24 hr capsule TAKE (1) CAPSULE BY MOUTH ONCE DAILY.  30 capsule  5  . [DISCONTINUED] simvastatin (ZOCOR) 40 MG tablet Take 40 mg by mouth at bedtime.         No current facility-administered medications for this visit.     Past Medical History  Diagnosis Date  .  Tachycardia-bradycardia syndrome     with pauses and syncope;PPM implantation 10/2008,negative stress nuclear study 03/2005  . Atrial fibrillation     persistant  . Mitral valve disease     Severe mitral annular calcification; mild to moderate stenosis-not clearly rheumatic  . Hypertension     heart disease diastolic dysfunction ;mild pulmonary edema in 2006;responded to diurectics ; LVH  . Chronic renal insufficiency     baseline creatine 1.4; 1.04 in 03/2010  . Hyperlipidemia   . Angioedema 08/2006  . Anemia     H/H of 11.3/36.7 in 12/2008 ;normal MCV; normal CBC in 2011  . Gout   . 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 Vertigo   . Shortness of breath   . Chronic bronchitis   . Blood transfusion   . DJD (degenerative joint disease)     hands  . Seizure disorder   . Diastolic dysfunction     with EF of 80%  . Nephrolithiasis   . Arthritis   . Stroke   .  Retinal hemorrhage     ROS:   All systems reviewed and negative except as noted in the HPI.   Past Surgical History  Procedure Laterality Date  . Insert / replace / remove pacemaker  11/2008    St.Jude DUAL chamber pacemaker 11/06/2008  . Total knee arthroplasty  04/2009    Right  . Joint replacement  2010    right   . Cataract extraction w/phaco  03/29/2011    Procedure: CATARACT EXTRACTION PHACO AND INTRAOCULAR LENS PLACEMENT (IOC);  Surgeon: Tonny Branch;  Location: [facility];  Service: Ophthalmology;  Laterality: Right;  CDE 12.62     Family History  Problem Relation Age of Onset  . Hypotension Neg Hx   . Anesthesia problems Neg Hx   . Pseudochol deficiency Neg Hx   . Malignant hyperthermia Neg Hx   . Cancer Other     unknown cancer     History   Social History  . Marital Status: Married    Spouse Name:  N/A    Number of Children: N/A  . Years of Education: N/A   Occupational History  . Not on file.   Social History Main Topics  . Smoking status: Never Smoker   . Smokeless tobacco: Never Used  . Alcohol Use: No  . Drug Use: No  . Sexually Active: Not on file   Other Topics Concern  . Not on file   Social History Narrative  . No narrative on file     BP 130/75  Pulse 72  Ht 5\' 2"  (1.575 m)  Wt 201 lb (91.173 kg)  BMI 36.75 kg/m2  SpO2 98%  Physical Exam:  Well appearing 79 year old woman, obese,NAD HEENT: Unremarkable Neck:  7 cm JVD, no thyromegally Lungs:  Clear with no wheezes, rales, or rhonchi. HEART:  Regular rate rhythm, no murmurs, no rubs, no clicks Abd:  soft, positive bowel sounds, no organomegally, no rebound, no guarding Ext:  2 plus pulses, no edema, no cyanosis, no clubbing Skin:  No rashes no nodules Neuro:  CN II through XII intact, motor grossly intact  DEVICE  Normal device function.  See PaceArt for details.   Assess/Plan:

## 2012-08-17 NOTE — Assessment & Plan Note (Signed)
Her St. Jude dual-chamber pacemaker is working normally. She is now chronically in atrial fibrillation, we have reprogrammed her device to the VVIR pacing mode. We'll plan to recheck in several months.

## 2012-08-17 NOTE — Assessment & Plan Note (Signed)
Her blood pressure is well controlled today. I've asked the patient to reduce her salt intake. No change in medical therapy.

## 2012-08-17 NOTE — Patient Instructions (Addendum)
Remote Transmission 6/14   Your physician wants you to follow-up in: 1 year You will receive a reminder letter in the mail two months in advance. If you don't receive a letter, please call our office to schedule the follow-up appointment.   Continue same medication

## 2012-08-21 ENCOUNTER — Encounter: Payer: Self-pay | Admitting: Internal Medicine

## 2012-09-14 ENCOUNTER — Ambulatory Visit (INDEPENDENT_AMBULATORY_CARE_PROVIDER_SITE_OTHER): Payer: Medicare Other | Admitting: *Deleted

## 2012-09-14 DIAGNOSIS — I639 Cerebral infarction, unspecified: Secondary | ICD-10-CM

## 2012-09-14 DIAGNOSIS — I6350 Cerebral infarction due to unspecified occlusion or stenosis of unspecified cerebral artery: Secondary | ICD-10-CM

## 2012-09-14 DIAGNOSIS — I4891 Unspecified atrial fibrillation: Secondary | ICD-10-CM

## 2012-09-14 DIAGNOSIS — Z7901 Long term (current) use of anticoagulants: Secondary | ICD-10-CM

## 2012-09-14 DIAGNOSIS — I495 Sick sinus syndrome: Secondary | ICD-10-CM

## 2012-09-19 ENCOUNTER — Telehealth: Payer: Self-pay | Admitting: Cardiology

## 2012-09-19 MED ORDER — WARFARIN SODIUM 5 MG PO TABS: 2.5000 mg | ORAL_TABLET | Freq: Every day | ORAL | Status: DC

## 2012-09-19 NOTE — Telephone Encounter (Signed)
Needs refill on Warfarin sent [facility]

## 2012-10-20 ENCOUNTER — Other Ambulatory Visit: Payer: Self-pay | Admitting: Cardiology

## 2012-10-26 ENCOUNTER — Ambulatory Visit (INDEPENDENT_AMBULATORY_CARE_PROVIDER_SITE_OTHER): Payer: Medicare Other | Admitting: *Deleted

## 2012-10-26 DIAGNOSIS — I495 Sick sinus syndrome: Secondary | ICD-10-CM

## 2012-10-26 DIAGNOSIS — I639 Cerebral infarction, unspecified: Secondary | ICD-10-CM

## 2012-10-26 DIAGNOSIS — I4891 Unspecified atrial fibrillation: Secondary | ICD-10-CM

## 2012-10-26 DIAGNOSIS — I6350 Cerebral infarction due to unspecified occlusion or stenosis of unspecified cerebral artery: Secondary | ICD-10-CM

## 2012-10-26 DIAGNOSIS — Z7901 Long term (current) use of anticoagulants: Secondary | ICD-10-CM

## 2012-10-26 LAB — POCT INR: INR: 2.4

## 2012-11-14 ENCOUNTER — Encounter: Payer: Self-pay | Admitting: Cardiology

## 2012-11-14 ENCOUNTER — Ambulatory Visit (INDEPENDENT_AMBULATORY_CARE_PROVIDER_SITE_OTHER): Payer: Medicare Other | Admitting: Cardiology

## 2012-11-14 VITALS — BP 137/82 | HR 77 | Ht 62.0 in | Wt 203.8 lb

## 2012-11-14 DIAGNOSIS — R569 Unspecified convulsions: Secondary | ICD-10-CM

## 2012-11-14 DIAGNOSIS — I4891 Unspecified atrial fibrillation: Secondary | ICD-10-CM

## 2012-11-14 DIAGNOSIS — Z7901 Long term (current) use of anticoagulants: Secondary | ICD-10-CM

## 2012-11-14 DIAGNOSIS — I10 Essential (primary) hypertension: Secondary | ICD-10-CM

## 2012-11-14 DIAGNOSIS — M109 Gout, unspecified: Secondary | ICD-10-CM

## 2012-11-14 LAB — COMPREHENSIVE METABOLIC PANEL
ALT: 11 U/L (ref 0–35)
Albumin: 3.8 g/dL (ref 3.5–5.2)
CO2: 27 mEq/L (ref 19–32)
Calcium: 9.8 mg/dL (ref 8.4–10.5)
Chloride: 104 mEq/L (ref 96–112)
Creat: 1.16 mg/dL — ABNORMAL HIGH (ref 0.50–1.10)
Sodium: 140 mEq/L (ref 135–145)
Total Protein: 6.8 g/dL (ref 6.0–8.3)

## 2012-11-14 LAB — URIC ACID: Uric Acid, Serum: 6.3 mg/dL (ref 2.4–7.0)

## 2012-11-14 LAB — CBC
Platelets: 204 10*3/uL (ref 150–400)
RDW: 13.9 % (ref 11.5–15.5)
WBC: 5.6 10*3/uL (ref 4.0–10.5)

## 2012-11-14 NOTE — Patient Instructions (Addendum)
Your physician recommends that you schedule a follow-up appointment in: [place] physician recommends that you return for lab work in: TODAY ([place] CMET,CBC,URIC ACID)

## 2012-11-14 NOTE — Progress Notes (Signed)
Patient ID: [name], female   DOB: 1927-05-21, 79 y.o.   MRN: [id number]  HPI: Schedule return visit for this lovely woman with long-standing atrial fibrillation. She is asymptomatic with respect to her arrhythmia and has been maintained on warfarin for it least 5 years with no adverse effects. She has a remote history of gout, but has not suffered an attack in a number of years. She remains as active as her orthopedic problems will permit with some exertional fatigue, but no chest pain or dyspnea.  Current Outpatient Prescriptions  Medication Sig Dispense Refill  . acetaminophen (TYLENOL) 500 MG tablet Take 500 mg by mouth every 6 (six) hours as needed. Pain       . albuterol (PROVENTIL HFA;VENTOLIN HFA) 108 (90 BASE) MCG/ACT inhaler Inhale 2 puffs into the lungs every 4 (four) hours as needed for wheezing or shortness of breath.  1 Inhaler  0  . allopurinol (ZYLOPRIM) 100 MG tablet Take 100 mg by mouth daily.        Marland Kitchen diltiazem (TIAZAC) 360 MG 24 hr capsule TAKE (1) CAPSULE BY MOUTH ONCE DAILY.  30 capsule  6  . dorzolamide-timolol (COSOPT) 22.3-6.8 MG/ML ophthalmic solution Place 1 drop into the left eye at bedtime.       . furosemide (LASIX) 40 MG tablet Take 1 tablet (40 mg total) by mouth daily.  30 tablet  0  . lisinopril (PRINIVIL,ZESTRIL) 20 MG tablet Take 1 tablet (20 mg total) by mouth daily.  30 tablet  11  . meclizine (ANTIVERT) 25 MG tablet Take 25 mg by mouth 3 (three) times daily as needed. Dizziness      . pravastatin (PRAVACHOL) 80 MG tablet Take 80 mg by mouth at bedtime.      . ranitidine (ZANTAC) 150 MG tablet Take 150 mg by mouth at bedtime.       Marland Kitchen warfarin (COUMADIN) 5 MG tablet Take 0.5 tablets (2.5 mg total) by mouth daily.  45 tablet  3  . [DISCONTINUED] colchicine 0.6 MG tablet Take 0.6 mg by mouth daily as needed. Gout Flare Ups      . [DISCONTINUED] diltiazem (CARDIZEM CD) 360 MG 24 hr capsule TAKE (1) CAPSULE BY MOUTH ONCE DAILY.  30 capsule  5  .  [DISCONTINUED] simvastatin (ZOCOR) 40 MG tablet Take 40 mg by mouth at bedtime.         No current facility-administered medications for this visit.   No Known Allergies    Past medical history, social history, and family history reviewed and updated.  ROS: Denies chest discomfort, orthopnea or PND. She has not followed blood pressures at home. She has never undergone colonoscopy and does not wish to. She notes some pedal edema, but this has not been troublesome. All other systems reviewed and are negative.  PHYSICAL EXAM: BP 137/82  Pulse 77  Ht 5\' 2"  (1.575 m)  Wt 92.42 kg (203 lb 12 oz)  BMI 37.26 kg/m2;  Body mass index is 37.26 kg/(m^2). General-Well developed; no acute distress Body habitus-moderately overweight Neck-No JVD; no carotid bruits Lungs-clear lung fields; resonant to percussion Cardiovascular-normal PMI; normal S1 and somewhat increased intensity of S2; modest basilar systolic murmur; mildly irregular rhythm Abdomen-normal bowel sounds; soft and non-tender without masses or organomegaly Musculoskeletal-No deformities, no cyanosis or clubbing Neurologic-Normal cranial nerves; symmetric strength and tone Skin-Warm, no significant lesions Extremities-1+ distal pulses; 1-2+ ankle edema  EKG: Atrial fibrillation with controlled ventricular response; ventricular rate-65 bpm; low-voltage; occasional ectopy versus aberrancy; delayed  R-wave progression; minor nonspecific ST segment abnormality. No previous tracing for comparison.  Jacqulyn Ducking, MD 11/14/2012  11:29 AM  ASSESSMENT AND PLAN

## 2012-11-14 NOTE — Progress Notes (Signed)
Name: [name]    DOB: Feb 17, 1927  Age: 79 y.o.  MR#: [id number]       PCP:  Leonides Grills, MD      Insurance: Payor: [place] / Plan: BLUE MEDICARE / Product Type: *No Product type* /   CC:    Chief Complaint  Patient presents with  . Sick sinus syndrome    Atrial fibrillation; permanent pacemaker   No list VS Filed Vitals:   11/14/12 1059  BP: 137/82  Pulse: 77  Height: 5\' 2"  (1.575 m)  Weight: 203 lb 12 oz (92.42 kg)    Weights Current Weight  11/14/12 203 lb 12 oz (92.42 kg)  08/17/12 201 lb (91.173 kg)  03/08/12 204 lb (92.534 kg)    Blood Pressure  BP Readings from Last 3 Encounters:  11/14/12 137/82  08/17/12 130/75  03/08/12 168/90     Admit date:  (Not on file) Last encounter with RMR:  10/20/2012   Allergy Review of patient's allergies indicates no known allergies.  Current Outpatient Prescriptions  Medication Sig Dispense Refill  . acetaminophen (TYLENOL) 500 MG tablet Take 500 mg by mouth every 6 (six) hours as needed. Pain       . albuterol (PROVENTIL HFA;VENTOLIN HFA) 108 (90 BASE) MCG/ACT inhaler Inhale 2 puffs into the lungs every 4 (four) hours as needed for wheezing or shortness of breath.  1 Inhaler  0  . allopurinol (ZYLOPRIM) 100 MG tablet Take 100 mg by mouth daily.        Marland Kitchen diltiazem (TIAZAC) 360 MG 24 hr capsule TAKE (1) CAPSULE BY MOUTH ONCE DAILY.  30 capsule  6  . dorzolamide-timolol (COSOPT) 22.3-6.8 MG/ML ophthalmic solution Place 1 drop into the left eye at bedtime.       . furosemide (LASIX) 40 MG tablet Take 1 tablet (40 mg total) by mouth daily.  30 tablet  0  . lisinopril (PRINIVIL,ZESTRIL) 20 MG tablet Take 1 tablet (20 mg total) by mouth daily.  30 tablet  11  . meclizine (ANTIVERT) 25 MG tablet Take 25 mg by mouth 3 (three) times daily as needed. Dizziness      . pravastatin (PRAVACHOL) 80 MG tablet Take 80 mg by mouth at bedtime.      . ranitidine (ZANTAC) 150 MG tablet Take 150 mg by mouth  at bedtime.       Marland Kitchen warfarin (COUMADIN) 5 MG tablet Take 0.5 tablets (2.5 mg total) by mouth daily.  45 tablet  3  . [DISCONTINUED] colchicine 0.6 MG tablet Take 0.6 mg by mouth daily as needed. Gout Flare Ups      . [DISCONTINUED] diltiazem (CARDIZEM CD) 360 MG 24 hr capsule TAKE (1) CAPSULE BY MOUTH ONCE DAILY.  30 capsule  5  . [DISCONTINUED] simvastatin (ZOCOR) 40 MG tablet Take 40 mg by mouth at bedtime.         No current facility-administered medications for this visit.    Discontinued Meds:   There are no discontinued medications.  Patient Active Problem List   Diagnosis Date Noted  . Cardiorenal syndrome 04/26/2012  . Fasting hyperglycemia 04/06/2012  . Atrial fibrillation 08/31/2011  . Chronic anticoagulation 12/16/2010  . 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 Cerebrovascular accident   . DJD (degenerative joint disease)   . Hypertension 02/21/2009  . Sick sinus syndrome 02/21/2009  . Hyperlipidemia 11/26/2008  . ANGIOEDEMA 11/26/2008  . SEIZURE DISORDER 10/31/2008    LABS    Component Value Date/Time  NA 140 07/24/2012 1237   NA 144 06/22/2012 1225   NA 146* 05/12/2012 1125   K 4.6 07/24/2012 1237   K 5.8* 06/22/2012 1225   K 5.0 05/12/2012 1125   CL 104 07/24/2012 1237   CL 106 06/22/2012 1225   CL 106 05/12/2012 1125   CO2 28 07/24/2012 1237   CO2 29 06/22/2012 1225   CO2 26 05/12/2012 1125   GLUCOSE 109* 07/24/2012 1237   GLUCOSE 109* 06/22/2012 1225   GLUCOSE 110* 05/12/2012 1125   BUN 21 07/24/2012 1237   BUN 19 06/22/2012 1225   BUN 14 05/12/2012 1125   CREATININE 1.09 07/24/2012 1237   CREATININE 1.22* 06/22/2012 1225   CREATININE 1.08 05/12/2012 1125   CREATININE 1.00 12/07/2011 0944   CREATININE 0.99 03/22/2011 1415   CREATININE 1.04 03/30/2010 2219   CALCIUM 10.2 07/24/2012 1237   CALCIUM 10.5 06/22/2012 1225   CALCIUM 9.9 05/12/2012 1125   GFRNONAA 51* 12/07/2011 0944   GFRNONAA 52* 03/22/2011 1415   GFRNONAA 55 09/11/2009   GFRAA 59* 12/07/2011 0944   GFRAA 60* 03/22/2011  1415   GFRAA  Value: >60        The eGFR has been calculated using the MDRD equation. This calculation has not been validated in all clinical situations. eGFR's persistently <60 mL/min signify possible Chronic Kidney Disease. 05/02/2009 0530   CMP     Component Value Date/Time   NA 140 07/24/2012 1237   K 4.6 07/24/2012 1237   CL 104 07/24/2012 1237   CO2 28 07/24/2012 1237   GLUCOSE 109* 07/24/2012 1237   BUN 21 07/24/2012 1237   CREATININE 1.09 07/24/2012 1237   CREATININE 1.00 12/07/2011 0944   CALCIUM 10.2 07/24/2012 1237   PROT 6.9 06/22/2012 1225   ALBUMIN 3.9 06/22/2012 1225   AST 19 06/22/2012 1225   ALT 14 06/22/2012 1225   ALKPHOS 79 06/22/2012 1225   BILITOT 0.4 06/22/2012 1225   GFRNONAA 51* 12/07/2011 0944   GFRAA 59* 12/07/2011 0944       Component Value Date/Time   WBC 6.4 12/07/2011 0944   WBC 6.9 03/22/2011 1415   WBC 8.3 03/30/2010 2219   HGB 14.5 12/07/2011 0944   HGB 14.3 03/22/2011 1415   HGB 14.5 03/30/2010 2219   HCT 43.8 12/07/2011 0944   HCT 44.1 03/22/2011 1415   HCT 46.6* 03/30/2010 2219   MCV 92.8 12/07/2011 0944   MCV 92.6 03/22/2011 1415   MCV 91.2 03/30/2010 2219    Lipid Panel     Component Value Date/Time   CHOL 143 03/30/2010 2219   TRIG 85 03/30/2010 2219   HDL 76 03/30/2010 2219   CHOLHDL 1.9 Ratio 03/30/2010 2219   VLDL 17 03/30/2010 2219   LDLCALC 50 03/30/2010 2219    ABG    Component Value Date/Time   PHART 7.337* 10/05/2008 1450   PCO2ART 39.7 10/05/2008 1450   PO2ART 129.0* 10/05/2008 1450   HCO3 20.7 10/05/2008 1450   TCO2 19.3 10/05/2008 1450   ACIDBASEDEF 4.1* 10/05/2008 1450   O2SAT 98.7 10/05/2008 1450     Lab Results  Component Value Date   TSH 1.131 09/11/2009   BNP (last 3 results)  Recent Labs  12/07/11 0944  PROBNP 1574.0*   Cardiac Panel (last 3 results) No results found for this basename: CKTOTAL, CKMB, TROPONINI, RELINDX,  in the last 72 hours  Iron/TIBC/Ferritin    Component Value Date/Time   IRON 67 11/06/2008 0610   TIBC 229*  11/06/2008  [id number]     EKG Orders placed in visit on 11/14/12  . EKG 12-LEAD     Prior Assessment and Plan Problem List as of 11/14/2012   Hyperlipidemia   Last Assessment & Plan   09/09/2011 Office Visit Edited 09/09/2011  2:44 PM by Yehuda Savannah, MD     Lipid profile remains excellent with current therapy, which will be continued.    Hypertension   Last Assessment & Plan   08/17/2012 Office Visit Written 08/17/2012 10:03 AM by Evans Lance, MD     Her blood pressure is well controlled today. I've asked the patient to reduce her salt intake. No change in medical therapy.    Sick sinus syndrome   Last Assessment & Plan   03/08/2012 Office Visit Written 03/08/2012  1:14 PM by Yehuda Savannah, MD     Explanation for recent dizziness is unclear.  No significant orthostatic change in blood pressure noted.    SEIZURE DISORDER   ANGIOEDEMA   Cerebrovascular accident   DJD (degenerative joint disease)   PPM-St.Jude   Last Assessment & Plan   08/17/2012 Office Visit Written 08/17/2012 10:02 AM by Evans Lance, MD     Her St. Jude dual-chamber pacemaker is working normally. She is now chronically in atrial fibrillation, we have reprogrammed her device to the VVIR pacing mode. We'll plan to recheck in several months.    Chronic anticoagulation   Last Assessment & Plan   03/08/2012 Office Visit Written 03/08/2012  1:11 PM by Yehuda Savannah, MD     Anticoagulation has been stable, therapeutic and apparently effective.  No evidence for chronic GI blood loss.  We will continue to monitor with CBCs and periodic stool Hemoccult testing.    Atrial fibrillation   Last Assessment & Plan   08/17/2012 Office Visit Written 08/17/2012 10:02 AM by Evans Lance, MD     Her ventricular rate is well controlled. She is pacing approximately 50% of the time. She will continue her current medical therapy.    Fasting hyperglycemia   Cardiorenal syndrome       Imaging: No results found.

## 2012-11-14 NOTE — Assessment & Plan Note (Signed)
Strategy of rate control and anticoagulation has been effective and will be continued.

## 2012-11-14 NOTE — Assessment & Plan Note (Signed)
Patient has done well on warfarin with no manifest or occult bleeding.  We will continue to monitor CBCs and FOBT.

## 2012-11-14 NOTE — Assessment & Plan Note (Signed)
Blood pressure control has improved following the addition of ACE inhibitor. There has been no deterioration in renal function. Current medical regime will be maintained.

## 2012-11-17 ENCOUNTER — Encounter: Payer: Self-pay | Admitting: *Deleted

## 2012-11-17 ENCOUNTER — Encounter: Payer: Self-pay | Admitting: Cardiology

## 2012-11-20 ENCOUNTER — Encounter: Payer: Self-pay | Admitting: Internal Medicine

## 2012-11-20 ENCOUNTER — Ambulatory Visit (INDEPENDENT_AMBULATORY_CARE_PROVIDER_SITE_OTHER): Payer: Medicare Other | Admitting: *Deleted

## 2012-11-20 DIAGNOSIS — I495 Sick sinus syndrome: Secondary | ICD-10-CM

## 2012-11-20 DIAGNOSIS — Z950 Presence of cardiac pacemaker: Secondary | ICD-10-CM

## 2012-11-20 LAB — REMOTE PACEMAKER DEVICE
BRDY-0004RV: 120 {beats}/min
DEVICE MODEL PM: 2323973
RV LEAD AMPLITUDE: 12 mv

## 2012-12-07 ENCOUNTER — Ambulatory Visit (INDEPENDENT_AMBULATORY_CARE_PROVIDER_SITE_OTHER): Payer: Medicare Other | Admitting: *Deleted

## 2012-12-07 DIAGNOSIS — I4891 Unspecified atrial fibrillation: Secondary | ICD-10-CM

## 2012-12-07 DIAGNOSIS — I495 Sick sinus syndrome: Secondary | ICD-10-CM

## 2012-12-07 DIAGNOSIS — Z7901 Long term (current) use of anticoagulants: Secondary | ICD-10-CM

## 2012-12-07 DIAGNOSIS — I6350 Cerebral infarction due to unspecified occlusion or stenosis of unspecified cerebral artery: Secondary | ICD-10-CM

## 2012-12-07 DIAGNOSIS — I639 Cerebral infarction, unspecified: Secondary | ICD-10-CM

## 2013-01-18 ENCOUNTER — Ambulatory Visit (INDEPENDENT_AMBULATORY_CARE_PROVIDER_SITE_OTHER): Payer: Medicare Other | Admitting: *Deleted

## 2013-01-18 DIAGNOSIS — I6350 Cerebral infarction due to unspecified occlusion or stenosis of unspecified cerebral artery: Secondary | ICD-10-CM

## 2013-01-18 DIAGNOSIS — I495 Sick sinus syndrome: Secondary | ICD-10-CM

## 2013-01-18 DIAGNOSIS — Z7901 Long term (current) use of anticoagulants: Secondary | ICD-10-CM

## 2013-01-18 DIAGNOSIS — I639 Cerebral infarction, unspecified: Secondary | ICD-10-CM

## 2013-01-18 DIAGNOSIS — I4891 Unspecified atrial fibrillation: Secondary | ICD-10-CM

## 2013-01-18 LAB — POCT INR: INR: 2.7

## 2013-02-09 ENCOUNTER — Other Ambulatory Visit: Payer: Self-pay | Admitting: Cardiology

## 2013-02-14 ENCOUNTER — Emergency Department (HOSPITAL_COMMUNITY)
Admission: EM | Admit: 2013-02-14 | Discharge: 2013-02-14 | Disposition: A | Discharge status: Home / Self Care | Payer: Medicare Other | Attending: Emergency Medicine | Admitting: Emergency Medicine

## 2013-02-14 ENCOUNTER — Telehealth: Payer: Self-pay | Admitting: *Deleted

## 2013-02-14 ENCOUNTER — Encounter (HOSPITAL_COMMUNITY): Payer: Self-pay

## 2013-02-14 DIAGNOSIS — Z87442 Personal history of urinary calculi: Secondary | ICD-10-CM | POA: Insufficient documentation

## 2013-02-14 DIAGNOSIS — I129 Hypertensive chronic kidney disease with stage 1 through stage 4 chronic kidney disease, or unspecified chronic kidney disease: Secondary | ICD-10-CM | POA: Insufficient documentation

## 2013-02-14 DIAGNOSIS — M25519 Pain in unspecified shoulder: Secondary | ICD-10-CM | POA: Insufficient documentation

## 2013-02-14 DIAGNOSIS — Z7901 Long term (current) use of anticoagulants: Secondary | ICD-10-CM | POA: Insufficient documentation

## 2013-02-14 DIAGNOSIS — Z79899 Other long term (current) drug therapy: Secondary | ICD-10-CM | POA: Insufficient documentation

## 2013-02-14 DIAGNOSIS — Z8673 Personal history of transient ischemic attack (TIA), and cerebral infarction without residual deficits: Secondary | ICD-10-CM | POA: Insufficient documentation

## 2013-02-14 DIAGNOSIS — Z8669 Personal history of other diseases of the nervous system and sense organs: Secondary | ICD-10-CM | POA: Insufficient documentation

## 2013-02-14 DIAGNOSIS — M1990 Unspecified osteoarthritis, unspecified site: Secondary | ICD-10-CM | POA: Insufficient documentation

## 2013-02-14 DIAGNOSIS — E785 Hyperlipidemia, unspecified: Secondary | ICD-10-CM | POA: Insufficient documentation

## 2013-02-14 DIAGNOSIS — M62838 Other muscle spasm: Secondary | ICD-10-CM | POA: Insufficient documentation

## 2013-02-14 DIAGNOSIS — R0602 Shortness of breath: Secondary | ICD-10-CM | POA: Insufficient documentation

## 2013-02-14 DIAGNOSIS — R110 Nausea: Secondary | ICD-10-CM | POA: Insufficient documentation

## 2013-02-14 DIAGNOSIS — I495 Sick sinus syndrome: Secondary | ICD-10-CM | POA: Insufficient documentation

## 2013-02-14 DIAGNOSIS — Z8709 Personal history of other diseases of the respiratory system: Secondary | ICD-10-CM | POA: Insufficient documentation

## 2013-02-14 DIAGNOSIS — Z862 Personal history of diseases of the blood and blood-forming organs and certain disorders involving the immune mechanism: Secondary | ICD-10-CM | POA: Insufficient documentation

## 2013-02-14 DIAGNOSIS — I4891 Unspecified atrial fibrillation: Secondary | ICD-10-CM | POA: Insufficient documentation

## 2013-02-14 DIAGNOSIS — M109 Gout, unspecified: Secondary | ICD-10-CM | POA: Insufficient documentation

## 2013-02-14 DIAGNOSIS — N189 Chronic kidney disease, unspecified: Secondary | ICD-10-CM | POA: Insufficient documentation

## 2013-02-14 MED ORDER — METHOCARBAMOL 500 MG PO TABS
500.0000 mg | ORAL_TABLET | Freq: Once | ORAL | Status: AC
  Administered 2013-02-14: 500 mg via ORAL
  Filled 2013-02-14: qty 1

## 2013-02-14 MED ORDER — TRAMADOL HCL 50 MG PO TABS
50.0000 mg | ORAL_TABLET | Freq: Once | ORAL | Status: AC
  Administered 2013-02-14: 50 mg via ORAL
  Filled 2013-02-14: qty 1

## 2013-02-14 MED ORDER — METHOCARBAMOL 500 MG PO TABS: 500.0000 mg | ORAL_TABLET | Freq: Four times a day (QID) | ORAL | Status: DC | PRN

## 2013-02-14 MED ORDER — TRAMADOL HCL 50 MG PO TABS: ORAL_TABLET | ORAL | Status: DC

## 2013-02-14 NOTE — ED Notes (Signed)
Pt reports right shoulder pain and neck pain that started 3 days ago , pain is constant and she denies any new or recent injury

## 2013-02-14 NOTE — Telephone Encounter (Signed)
Pt walked into the office this morning per shoulder/neck pain, pt noted she went to her PCP this am and they did not have a place for her to be seen, pt then decided to walk into our office, pt advised she had sharp chest pain on and off since this past Sunday, pt does note SOB and swelling in lower extremities, no apt available at this time and per pt having active chest pain this nurse advised she needs to be evaluated in the ED, the pt was reluctant to go to the ED, advised the importance of evaluation, pt family member spoke up and told the pt she needed to go on Sunday when this first started but she refused to go at that time, this nurse offered to take the pt via wheelchair, pt again refused, this nurse reiterated the importance of being evaluated, pt finally agreed and pt left ambulating by choice with family member with directions to the ER per pt refused this nurse to walk her to the ED

## 2013-02-14 NOTE — Telephone Encounter (Signed)
Noted pt currently being evaluated in the ED as advised

## 2013-02-14 NOTE — ED Provider Notes (Signed)
CSN: [id number]     Arrival date & time 02/14/13  0848 History  This chart was scribed for Meghan Norrie, MD by Roxan Diesel, ED scribe.  This patient was seen in room APA14/APA14 and the patient's care was started at 9:17 AM.    Chief Complaint  Patient presents with  . Shoulder Pain  . Neck Pain    The history is provided by the patient. No language interpreter was used.    HPI Comments: [name] is a 79 y.o. female with h/o degenerative disc disease, chronic bronchitis, atrial fibrillation and stroke who presents to the Emergency Department complaining of 3 days of sudden-onset pain to entire neck "like a necklace," bilateral shoulders, upper arms into elbows, and upper back.  Pain began 3 days ago when she was getting off of a bus and she notes that was sitting in air conditioning before onset. She reports she was on the church bus traveling to [place] to sing and she felt the [facility] was too high and got chilled.  She denies having "pain" and states she has a soreness or achiness. She denies any other injuries or unusual activities that may have brought on pain.  Pain is described as moderate aching and soreness.  It is brought on by holding her head up and moving her head/neck and relieved by lying down.  Pt denies prior h/o similar symptoms.  She also notes that she felt nauseous for a short time last night.  In addition she complains of chronic unchanged exertional SOB.  She denies fever, leg swelling, or diaphoresis.  Pt is ambulatory at baseline but has vision loss in her left eye residual to a stroke.  She had weakness in her left side that is still present and uses a cane to steady herself. She does not smoke or drink.  PCP is Dr. Orson Ape  Past Medical History  Diagnosis Date  . Tachycardia-bradycardia syndrome     with pauses and syncope;PPM implantation 10/2008,negative stress nuclear study 03/2005  . Atrial fibrillation     persistant  . Mitral valve disease     Severe  mitral annular calcification; mild to moderate stenosis-not clearly rheumatic  . Hypertension     heart disease diastolic dysfunction ; 123456; mild pulmonary edema in 2006;responded to diurectics ; LVH  . Chronic renal insufficiency     baseline creatine 1.4; 1.04 in 03/2010  . Hyperlipidemia   . Angioedema 08/2006  . Anemia     H/H of 11.3/36.7 in 12/2008 ;normal MCV; normal CBC in 2011  . Gout   . 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 Vertigo   . Shortness of breath   . Chronic bronchitis   . Blood transfusion   . DJD (degenerative joint disease)     hands, knees  . Seizure disorder   . Nephrolithiasis   . Stroke   . Retinal hemorrhage     Past Surgical History  Procedure Laterality Date  . Insert / replace / remove pacemaker  11/2008    [employer] DUAL chamber pacemaker 11/06/2008  . Total knee arthroplasty  04/2009    Right  . Cataract extraction w/phaco  03/29/2011    Procedure: CATARACT EXTRACTION PHACO AND INTRAOCULAR LENS PLACEMENT (IOC);  Surgeon: Tonny Branch;  Location: [facility];  Service: Ophthalmology;  Laterality: Right;  CDE 12.62    Family History  Problem Relation Age of Onset  . Hypotension Neg Hx   . Anesthesia problems Neg Hx   . Pseudochol deficiency Neg Hx   . Malignant hyperthermia Neg Hx   . Cancer Other     unknown cancer    History  Substance Use Topics  . Smoking status: Never Smoker   . Smokeless tobacco: Never Used  . Alcohol Use: No  lives at home Lives with spouse Uses a cane  OB History   Grav Para Term Preterm Abortions TAB SAB Ect Mult Living                  Review of Systems  Constitutional: Negative for fever and diaphoresis.  HENT: Positive for neck pain.   Respiratory: Positive for shortness of breath (chronic, at  baseline).   Cardiovascular: Negative for leg swelling.  Gastrointestinal: Positive for nausea.  Musculoskeletal: Positive for arthralgias.     Allergies  Review of patient's allergies indicates no known allergies.  Home Medications   Current Outpatient Rx  Name  Route  Sig  Dispense  Refill  . acetaminophen (TYLENOL) 500 MG tablet   Oral   Take 500 mg by mouth every 6 (six) hours as needed. Pain          . allopurinol (ZYLOPRIM) 100 MG tablet   Oral   Take 100 mg by mouth daily.           Marland Kitchen diltiazem (TIAZAC) 360 MG 24 hr capsule      TAKE (1) CAPSULE BY MOUTH ONCE DAILY.   30 capsule   6   . dorzolamide-timolol (COSOPT) 22.3-6.8 MG/ML ophthalmic solution   Left Eye   Place 1 drop into the left eye at bedtime.          Marland Kitchen EXPIRED: furosemide (LASIX) 40 MG tablet   Oral   Take 1 tablet (40 mg total) by mouth daily.   30 tablet   0   . lisinopril (PRINIVIL,ZESTRIL) 20 MG tablet      TAKE ONE TABLET BY MOUTH ONCE DAILY.   30 tablet   6   . meclizine (ANTIVERT) 25 MG tablet   Oral   Take 25 mg by mouth 3 (three) times daily as needed. Dizziness         . pravastatin (PRAVACHOL) 80 MG tablet   Oral   Take 80 mg by mouth at bedtime.         . ranitidine (ZANTAC) 150 MG tablet   Oral   Take 150 mg by mouth at bedtime.          Marland Kitchen warfarin (COUMADIN) 5 MG tablet   Oral   Take 0.5 tablets (2.5 mg total) by mouth daily.   45 tablet   3    BP 153/69  Pulse 101  Temp(Src) 97.6 F (36.4 C) (Oral)  Resp 20  Ht 5\' 3"  (1.6 m)  Wt 198 lb (89.812 kg)  BMI 35.08 kg/m2  SpO2 100%  Vital signs normal except tachycardia   Physical Exam  Nursing note and vitals reviewed. Constitutional: She is oriented to person, place, and time. She appears well-developed and well-nourished.  Non-toxic appearance. She does not appear ill. No distress.  HENT:  Head: Normocephalic and atraumatic.  Right Ear: External ear normal.  Left Ear: External ear normal.   Nose: Nose normal. No mucosal edema or rhinorrhea.  Mouth/Throat: Oropharynx is clear and moist and  mucous membranes are normal. No dental abscesses or edematous.  Eyes: Conjunctivae and EOM are normal. Pupils are equal, round, and reactive to light.  Neck: Neck supple. No spinous process tenderness present.    Soft swelling of the lower right neck that feels like a lipoma (chronic per pt) Noted on diagram Cervical spine non-tender When she looks to the left she has pain in left SCM When she looks to the right she has pain in right proximal medial trapezius  Cardiovascular: Normal rate, regular rhythm and normal heart sounds.  Exam reveals no gallop and no friction rub.   No murmur heard. Pulmonary/Chest: Effort normal and breath sounds normal. No respiratory distress. She has no wheezes. She has no rhonchi. She has no rales. She exhibits no tenderness and no crepitus.  Abdominal: Soft. Normal appearance and bowel sounds are normal. She exhibits no distension. There is no tenderness. There is no rebound and no guarding.  Musculoskeletal: Normal range of motion. She exhibits no edema and no tenderness.  Moves all extremities well. No pain with ROM of her arms including abduction  Neurological: She is alert and oriented to person, place, and time. She has normal strength. No cranial nerve deficit.  Skin: Skin is warm, dry and intact. No rash noted. No erythema. No pallor.  Psychiatric: She has a normal mood and affect. Her speech is normal and behavior is normal. Her mood appears not anxious.    ED Course  Procedures (including critical care time) Medications  methocarbamol (ROBAXIN) tablet 500 mg (not administered)  traMADol (ULTRAM) tablet 50 mg (not administered)     DIAGNOSTIC STUDIES: Oxygen Saturation is 100% on room air, normal by my interpretation.    COORDINATION OF CARE: 9:23 AM-Discussed treatment plan which includes EKG with pt at bedside and pt agreed to plan.    Date:  02/14/2013  Rate: 84  Rhythm: atrial flutter  QRS Axis: normal  Intervals: normal  ST/T Wave abnormalities: normal  Conduction Disutrbances:none  Narrative Interpretation: low voltage, septal infarct, inf infarct  Old EKG Reviewed: unchanged from 12/07/2011    MDM   1. Muscle spasms of neck    New Prescriptions   METHOCARBAMOL (ROBAXIN) 500 MG TABLET    Take 1 tablet (500 mg total) by mouth every 6 (six) hours as needed.   TRAMADOL (ULTRAM) 50 MG TABLET    Take 1 or 2 po Q 6hrs for pain    Plan discharge  Rolland Porter, MD, FACEP   I personally performed the services described in this documentation, which was scribed in my presence. The recorded information has been reviewed and considered.  Rolland Porter, MD, Abram Sander    Meghan Norrie, MD 02/14/13 [phone]

## 2013-02-19 ENCOUNTER — Encounter: Payer: Self-pay | Admitting: Internal Medicine

## 2013-02-19 ENCOUNTER — Ambulatory Visit (INDEPENDENT_AMBULATORY_CARE_PROVIDER_SITE_OTHER): Payer: Medicare Other | Admitting: *Deleted

## 2013-02-19 DIAGNOSIS — I495 Sick sinus syndrome: Secondary | ICD-10-CM

## 2013-02-19 DIAGNOSIS — Z950 Presence of cardiac pacemaker: Secondary | ICD-10-CM

## 2013-02-20 LAB — REMOTE PACEMAKER DEVICE
RV LEAD AMPLITUDE: 11.3 mv
RV LEAD IMPEDENCE PM: 460 Ohm
VENTRICULAR PACING PM: 46

## 2013-02-28 ENCOUNTER — Encounter: Payer: Self-pay | Admitting: *Deleted

## 2013-03-01 ENCOUNTER — Ambulatory Visit (INDEPENDENT_AMBULATORY_CARE_PROVIDER_SITE_OTHER): Payer: Medicare Other | Admitting: *Deleted

## 2013-03-01 DIAGNOSIS — I639 Cerebral infarction, unspecified: Secondary | ICD-10-CM

## 2013-03-01 DIAGNOSIS — I4891 Unspecified atrial fibrillation: Secondary | ICD-10-CM

## 2013-03-01 DIAGNOSIS — I495 Sick sinus syndrome: Secondary | ICD-10-CM

## 2013-03-01 DIAGNOSIS — Z7901 Long term (current) use of anticoagulants: Secondary | ICD-10-CM

## 2013-03-01 DIAGNOSIS — I6350 Cerebral infarction due to unspecified occlusion or stenosis of unspecified cerebral artery: Secondary | ICD-10-CM

## 2013-03-18 ENCOUNTER — Emergency Department (HOSPITAL_COMMUNITY)
Admission: EM | Admit: 2013-03-18 | Discharge: 2013-03-18 | Disposition: A | Discharge status: Home / Self Care | Payer: Medicare Other | Attending: Emergency Medicine | Admitting: Emergency Medicine

## 2013-03-18 ENCOUNTER — Emergency Department (HOSPITAL_COMMUNITY): Payer: Medicare Other

## 2013-03-18 ENCOUNTER — Encounter (HOSPITAL_COMMUNITY): Payer: Self-pay | Admitting: Emergency Medicine

## 2013-03-18 DIAGNOSIS — I129 Hypertensive chronic kidney disease with stage 1 through stage 4 chronic kidney disease, or unspecified chronic kidney disease: Secondary | ICD-10-CM | POA: Insufficient documentation

## 2013-03-18 DIAGNOSIS — Z862 Personal history of diseases of the blood and blood-forming organs and certain disorders involving the immune mechanism: Secondary | ICD-10-CM | POA: Insufficient documentation

## 2013-03-18 DIAGNOSIS — E785 Hyperlipidemia, unspecified: Secondary | ICD-10-CM | POA: Insufficient documentation

## 2013-03-18 DIAGNOSIS — Z87442 Personal history of urinary calculi: Secondary | ICD-10-CM | POA: Insufficient documentation

## 2013-03-18 DIAGNOSIS — R079 Chest pain, unspecified: Secondary | ICD-10-CM

## 2013-03-18 DIAGNOSIS — M109 Gout, unspecified: Secondary | ICD-10-CM | POA: Insufficient documentation

## 2013-03-18 DIAGNOSIS — I4891 Unspecified atrial fibrillation: Secondary | ICD-10-CM | POA: Insufficient documentation

## 2013-03-18 DIAGNOSIS — R110 Nausea: Secondary | ICD-10-CM

## 2013-03-18 DIAGNOSIS — R630 Anorexia: Secondary | ICD-10-CM

## 2013-03-18 DIAGNOSIS — Z8673 Personal history of transient ischemic attack (TIA), and cerebral infarction without residual deficits: Secondary | ICD-10-CM | POA: Insufficient documentation

## 2013-03-18 DIAGNOSIS — N189 Chronic kidney disease, unspecified: Secondary | ICD-10-CM | POA: Insufficient documentation

## 2013-03-18 DIAGNOSIS — Z79899 Other long term (current) drug therapy: Secondary | ICD-10-CM | POA: Insufficient documentation

## 2013-03-18 DIAGNOSIS — E860 Dehydration: Secondary | ICD-10-CM

## 2013-03-18 DIAGNOSIS — Z8669 Personal history of other diseases of the nervous system and sense organs: Secondary | ICD-10-CM | POA: Insufficient documentation

## 2013-03-18 DIAGNOSIS — Z950 Presence of cardiac pacemaker: Secondary | ICD-10-CM | POA: Insufficient documentation

## 2013-03-18 DIAGNOSIS — R55 Syncope and collapse: Secondary | ICD-10-CM

## 2013-03-18 DIAGNOSIS — Z7901 Long term (current) use of anticoagulants: Secondary | ICD-10-CM | POA: Insufficient documentation

## 2013-03-18 DIAGNOSIS — Z8709 Personal history of other diseases of the respiratory system: Secondary | ICD-10-CM | POA: Insufficient documentation

## 2013-03-18 DIAGNOSIS — Z8739 Personal history of other diseases of the musculoskeletal system and connective tissue: Secondary | ICD-10-CM | POA: Insufficient documentation

## 2013-03-18 LAB — BASIC METABOLIC PANEL
CO2: 26 mEq/L (ref 19–32)
Calcium: 10.9 mg/dL — ABNORMAL HIGH (ref 8.4–10.5)
Chloride: 98 mEq/L (ref 96–112)
Creatinine, Ser: 1.61 mg/dL — ABNORMAL HIGH (ref 0.50–1.10)
Glucose, Bld: 160 mg/dL — ABNORMAL HIGH (ref 70–99)
Sodium: 135 mEq/L (ref 135–145)

## 2013-03-18 LAB — CBC
Hemoglobin: 15.3 g/dL — ABNORMAL HIGH (ref 12.0–15.0)
MCH: 30.2 pg (ref 26.0–34.0)
MCV: 90.7 fL (ref 78.0–100.0)
RBC: 5.06 MIL/uL (ref 3.87–5.11)
WBC: 10.7 10*3/uL — ABNORMAL HIGH (ref 4.0–10.5)

## 2013-03-18 LAB — PROTIME-INR: Prothrombin Time: 19.8 seconds — ABNORMAL HIGH (ref 11.6–15.2)

## 2013-03-18 LAB — TROPONIN I: Troponin I: <0.3 ng/mL (ref <0.30)

## 2013-03-18 LAB — POCT I-STAT TROPONIN I: Troponin i, poc: 0.03 ng/mL (ref 0.00–0.08)

## 2013-03-18 MED ORDER — ONDANSETRON HCL 4 MG PO TABS: 4.0000 mg | ORAL_TABLET | Freq: Three times a day (TID) | ORAL | Status: DC | PRN

## 2013-03-18 MED ORDER — ONDANSETRON HCL 4 MG/2ML IJ SOLN: 4.0000 mg | Freq: Once | INTRAMUSCULAR | Status: DC

## 2013-03-18 NOTE — ED Notes (Signed)
Pt was at church when she had 2 syncopal episodes witnessed by family and congregation.  Pt does not recall.  Prior to EMS arrival pt had some sob and CP.  No CP or sob on arrival, no dizziness or weakness, pt is alert and oriented.  VSS for ems.  HR 75 BP 130/80 spo2 99% on RA.  Pt has hx of pacemaker.   No IV pta.  CBG 168. No ekg by ems

## 2013-03-18 NOTE — ED Notes (Signed)
[employer] is en route from [employer] for interrogation.  [id number]

## 2013-03-18 NOTE — ED Notes (Signed)
Pt tolerating p.o intake.  No nausea or vomiting at this time.  No distress noted.  Pt denies complaints at present.

## 2013-03-18 NOTE — ED Notes (Signed)
Although pt BP dropped while sitting and standing she denies any dizziness or lightheadedness.  Pt states her "stomach was bubbling and not feeling well at church" so she got up and went out to hall where she had the syncopal episode

## 2013-03-18 NOTE — ED Provider Notes (Signed)
CSN: [id number]     Arrival date & time 03/18/13  1625 History   First MD Initiated Contact with Patient 03/18/13 1628     Chief Complaint  Patient presents with  . Loss of Consciousness   (Consider location/radiation/quality/duration/timing/severity/associated sxs/prior Treatment) HPI  Patient reports she was in church and she got a pain in her left upper chest over her pacemaker battery that lasted a few seconds. She states she then felt her stomach start rumbling and she got nauseated. She said the nausea it went up into her throat. At this point she was noted to be not feeling well and her family tried to take her from her church pew out into the hall and she passed out twice. She was able to be ambulated by her family's assistance before she passed out. She does not recall passing out. She does remember EMS arriving. Her husband states she was confused only briefly. She denies chest pain now. She has mild nausea now. She states she used to have syncopal spells before she got her pacemaker when her heart rate would get too slow. She denies shortness of breath. She states tonight at church she ate pinto beans, a lemon high and collared. She states she has had loss of appetite because as soon as she sees food she does not want to eat. She has not been eating because of nausea. She also states she had a cough about a week ago and was seen by her physician and given an injection and put on antibiotic pills for 5 days and that is much improved.  PCP Dr Orson Ape [facility] [place] (Dr Lattie Haw).  Past Medical History  Diagnosis Date  . Tachycardia-bradycardia syndrome     with pauses and syncope;PPM implantation 10/2008,negative stress nuclear study 03/2005  . Atrial fibrillation     persistant  . Mitral valve disease     Severe mitral annular calcification; mild to moderate stenosis-not clearly rheumatic  . Hypertension     heart disease diastolic dysfunction ; 123456; mild pulmonary edema in  2006;responded to diurectics ; LVH  . Chronic renal insufficiency     baseline creatine 1.4; 1.04 in 03/2010  . Hyperlipidemia   . Angioedema 08/2006  . Anemia     H/H of 11.3/36.7 in 12/2008 ;normal MCV; normal CBC in 2011  . Gout   . 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 Vertigo   . Shortness of breath   . Chronic bronchitis   . Blood transfusion   . DJD (degenerative joint disease)     hands, knees  . Seizure disorder   . Nephrolithiasis   . Stroke   . Retinal hemorrhage    Past Surgical History  Procedure Laterality Date  . Insert / replace / remove pacemaker  11/2008    [employer] DUAL chamber pacemaker 11/06/2008  . Total knee arthroplasty  04/2009    Right  . Cataract extraction w/phaco  03/29/2011    Procedure: CATARACT EXTRACTION PHACO AND INTRAOCULAR LENS PLACEMENT (IOC);  Surgeon: Tonny Branch;  Location: [facility];  Service: Ophthalmology;  Laterality: Right;  CDE 12.62   Family History  Problem Relation Age of Onset  . Hypotension Neg Hx   . Anesthesia problems Neg Hx   . Pseudochol deficiency Neg Hx   . Malignant hyperthermia Neg Hx   .  Cancer Other     unknown cancer   History  Substance Use Topics  . Smoking status: Never Smoker   . Smokeless tobacco: Never Used  . Alcohol Use: No  lives at home Lives with spouse   OB History   Grav Para Term Preterm Abortions TAB SAB Ect Mult Living                 Review of Systems  All other systems reviewed and are negative.    Allergies  Review of patient's allergies indicates no known allergies.  Home Medications   Current Outpatient Rx  Name  Route  Sig  Dispense  Refill  . acetaminophen (TYLENOL) 500 MG tablet   Oral   Take 500 mg by mouth every 6 (six) hours as needed. Pain          . allopurinol  (ZYLOPRIM) 100 MG tablet   Oral   Take 100 mg by mouth daily.           Marland Kitchen diltiazem (TIAZAC) 360 MG 24 hr capsule   Oral   Take 360 mg by mouth daily.         . dorzolamide-timolol (COSOPT) 22.3-6.8 MG/ML ophthalmic solution   Left Eye   Place 1 drop into the left eye at bedtime.          Marland Kitchen lisinopril (PRINIVIL,ZESTRIL) 20 MG tablet   Oral   Take 20 mg by mouth daily.         . meclizine (ANTIVERT) 25 MG tablet   Oral   Take 25 mg by mouth 3 (three) times daily as needed. Dizziness         . methocarbamol (ROBAXIN) 500 MG tablet   Oral   Take 1 tablet (500 mg total) by mouth every 6 (six) hours as needed.   40 tablet   0   . pravastatin (PRAVACHOL) 80 MG tablet   Oral   Take 80 mg by mouth at bedtime.         . ranitidine (ZANTAC) 150 MG tablet   Oral   Take 150 mg by mouth at bedtime.          Marland Kitchen warfarin (COUMADIN) 5 MG tablet   Oral   Take 0.5 tablets (2.5 mg total) by mouth daily.   45 tablet   3   . furosemide (LASIX) 40 MG tablet   Oral   Take 40 mg by mouth daily as needed for fluid.           BP 145/59  Pulse 69  Temp(Src) 97.6 F (36.4 C) (Oral)  Resp 17  SpO2 100%  Vital signs normal   Physical Exam  Nursing note and vitals reviewed. Constitutional: She is oriented to person, place, and time. She appears well-developed and well-nourished.  Non-toxic appearance. She does not appear ill. No distress.  HENT:  Head: Normocephalic and atraumatic.  Right Ear: External ear normal.  Left Ear: External ear normal.  Nose: Nose normal. No mucosal edema or rhinorrhea.  Mouth/Throat: Oropharynx is clear and moist and mucous membranes are normal. No dental abscesses or uvula swelling.  Eyes: Conjunctivae and EOM are normal. Pupils are equal, round, and reactive to light.  Neck: Normal range of motion and full passive range of motion without pain. Neck supple.  Cardiovascular: Normal rate, regular rhythm and normal heart sounds.  Exam reveals  no gallop and no friction rub.   No murmur heard. Pulmonary/Chest: Effort normal and breath sounds  normal. No respiratory distress. She has no wheezes. She has no rhonchi. She has no rales. She exhibits no tenderness and no crepitus.  Patient's pacemaker battery appears to be near her left axilla.  Abdominal: Soft. Normal appearance and bowel sounds are normal. She exhibits no distension. There is no tenderness. There is no rebound and no guarding.  Musculoskeletal: Normal range of motion. She exhibits no edema and no tenderness.  Moves all extremities well.   Neurological: She is alert and oriented to person, place, and time. She has normal strength. No cranial nerve deficit.  Skin: Skin is warm, dry and intact. No rash noted. No erythema. No pallor.  Psychiatric: She has a normal mood and affect. Her speech is normal and behavior is normal. Her mood appears not anxious.    ED Course  Procedures (including critical care time)  Medications  ondansetron (ZOFRAN) injection 4 mg (4 mg Intramuscular Not Given 03/18/13 1732)    Patient was given Zofran for nausea and she was able to drink fluids. Her pacemaker was interrogated by the [facility] tach at [place] [facility]. She states at 11:30 this morning the patient had a episode of atrial fib with our RVR but at the time that she had her syncopal episode there was no significant arrhythmia. She has had a prior episode of RV R recorded by her pacemaker.   Patient's BNP is higher than her baseline and her chest x-ray is suggestive of congestive heart failure however the patient does not have any complaints of shortness of breath. Her pulse ox has been 100%.  Patient had a second troponin done that was negative. His point it was felt she could go home. She is advised to talk to her PCP about her loss of appetite. She also should call her cardiologist to be seen this week in the office. Her chest pain tonight did not sound cardiac. She may have had a  vasovagal type episode because she was nauseated before she passed out.   Labs Review Results for orders placed during the hospital encounter of 03/18/13  CBC      Result Value Range   WBC 10.7 (*) 4.0 - 10.5 K/uL   RBC 5.06  3.87 - 5.11 MIL/uL   Hemoglobin 15.3 (*) 12.0 - 15.0 g/dL   HCT 45.9  36.0 - 46.0 %   MCV 90.7  78.0 - 100.0 fL   MCH 30.2  26.0 - 34.0 pg   MCHC 33.3  30.0 - 36.0 g/dL   RDW 13.8  11.5 - 15.5 %   Platelets 180  150 - 400 K/uL  BASIC METABOLIC PANEL      Result Value Range   Sodium 135  135 - 145 mEq/L   Potassium 3.8  3.5 - 5.1 mEq/L   Chloride 98  96 - 112 mEq/L   CO2 26  19 - 32 mEq/L   Glucose, Bld 160 (*) 70 - 99 mg/dL   BUN 32 (*) 6 - 23 mg/dL   Creatinine, Ser 1.61 (*) 0.50 - 1.10 mg/dL   Calcium 10.9 (*) 8.4 - 10.5 mg/dL   GFR calc non Af Amer 28 (*) >90 mL/min   GFR calc Af Amer 33 (*) >90 mL/min  PRO B NATRIURETIC PEPTIDE      Result Value Range   Pro B Natriuretic peptide (BNP) 1435.0 (*) 0 - 450 pg/mL  PROTIME-INR      Result Value Range   Prothrombin Time 19.8 (*) 11.6 - 15.2  seconds   INR 1.74 (*) 0.00 - 1.49  TROPONIN I      Result Value Range   Troponin I <0.30  <0.30 ng/mL  POCT I-STAT TROPONIN I      Result Value Range   Troponin i, poc 0.03  0.00 - 0.08 ng/mL   Comment 3            Laboratory interpretation all normal except subtherapeutic INR, mild elevation of BNP   Imaging Review Dg Chest Portable 1 View  03/18/2013   CLINICAL DATA:  Loss of consciousness.  EXAM: PORTABLE CHEST - 1 VIEW  COMPARISON:  12/07/2011.  FINDINGS: Cardiomegaly. Intact dual lead pacemaker with leads in good position. Mild vascular congestion. No infiltrates or pneumothorax. Severe degenerative change both shoulders.  IMPRESSION: Cardiomegaly with mild vascular congestion. Worsening aeration from priors.   Electronically Signed   By: Rolla Flatten M.D.   On: 03/18/2013 17:31    EKG Interpretation     Ventricular Rate:  65 PR Interval:    QRS  Duration: 64 QT Interval:  404 QTC Calculation: 420 R Axis:   -11 Text Interpretation:  Atrial fibrillation with frequent ventricular-paced complexes Anteroseptal infarct , age undetermined Electrode noise When compared with ECG of 14-Feb-2013 09:09, Electronic ventricular pacemaker has replaced Atrial fibrillation            MDM   1. Syncope   2. Nausea   3. Chest pain   4. Loss of appetite   5. Dehydration     New Prescriptions   ONDANSETRON (ZOFRAN) 4 MG TABLET    Take 1 tablet (4 mg total) by mouth every 8 (eight) hours as needed for nausea.    Plan discharge   Rolland Porter, MD, Alanson Aly, MD 03/18/13 2049

## 2013-03-18 NOTE — ED Notes (Signed)
[employer] was contacted for pacemaker interrogation

## 2013-03-21 ENCOUNTER — Encounter: Payer: Medicare Other | Admitting: Adult Health

## 2013-03-23 ENCOUNTER — Encounter: Payer: Medicare Other | Admitting: Adult Health

## 2013-03-29 ENCOUNTER — Emergency Department (HOSPITAL_COMMUNITY)
Admission: EM | Admit: 2013-03-29 | Discharge: 2013-03-29 | Disposition: A | Discharge status: Home / Self Care | Payer: Medicare Other | Attending: Emergency Medicine | Admitting: Emergency Medicine

## 2013-03-29 ENCOUNTER — Emergency Department (HOSPITAL_COMMUNITY): Payer: Medicare Other

## 2013-03-29 ENCOUNTER — Encounter (HOSPITAL_COMMUNITY): Payer: Self-pay | Admitting: Emergency Medicine

## 2013-03-29 DIAGNOSIS — Z8739 Personal history of other diseases of the musculoskeletal system and connective tissue: Secondary | ICD-10-CM | POA: Insufficient documentation

## 2013-03-29 DIAGNOSIS — Z950 Presence of cardiac pacemaker: Secondary | ICD-10-CM | POA: Diagnosis not present

## 2013-03-29 DIAGNOSIS — R0789 Other chest pain: Secondary | ICD-10-CM | POA: Diagnosis not present

## 2013-03-29 DIAGNOSIS — N189 Chronic kidney disease, unspecified: Secondary | ICD-10-CM | POA: Diagnosis not present

## 2013-03-29 DIAGNOSIS — E785 Hyperlipidemia, unspecified: Secondary | ICD-10-CM | POA: Diagnosis not present

## 2013-03-29 DIAGNOSIS — I4891 Unspecified atrial fibrillation: Secondary | ICD-10-CM | POA: Diagnosis not present

## 2013-03-29 DIAGNOSIS — R109 Unspecified abdominal pain: Secondary | ICD-10-CM | POA: Insufficient documentation

## 2013-03-29 DIAGNOSIS — Z8669 Personal history of other diseases of the nervous system and sense organs: Secondary | ICD-10-CM | POA: Insufficient documentation

## 2013-03-29 DIAGNOSIS — I129 Hypertensive chronic kidney disease with stage 1 through stage 4 chronic kidney disease, or unspecified chronic kidney disease: Secondary | ICD-10-CM | POA: Insufficient documentation

## 2013-03-29 DIAGNOSIS — Z8709 Personal history of other diseases of the respiratory system: Secondary | ICD-10-CM | POA: Insufficient documentation

## 2013-03-29 DIAGNOSIS — R0602 Shortness of breath: Secondary | ICD-10-CM | POA: Insufficient documentation

## 2013-03-29 DIAGNOSIS — Z87442 Personal history of urinary calculi: Secondary | ICD-10-CM | POA: Insufficient documentation

## 2013-03-29 DIAGNOSIS — Z8673 Personal history of transient ischemic attack (TIA), and cerebral infarction without residual deficits: Secondary | ICD-10-CM | POA: Diagnosis not present

## 2013-03-29 DIAGNOSIS — Z862 Personal history of diseases of the blood and blood-forming organs and certain disorders involving the immune mechanism: Secondary | ICD-10-CM | POA: Diagnosis not present

## 2013-03-29 DIAGNOSIS — M109 Gout, unspecified: Secondary | ICD-10-CM | POA: Insufficient documentation

## 2013-03-29 DIAGNOSIS — R079 Chest pain, unspecified: Secondary | ICD-10-CM

## 2013-03-29 DIAGNOSIS — Z79899 Other long term (current) drug therapy: Secondary | ICD-10-CM | POA: Insufficient documentation

## 2013-03-29 DIAGNOSIS — M79609 Pain in unspecified limb: Secondary | ICD-10-CM | POA: Insufficient documentation

## 2013-03-29 DIAGNOSIS — Z7901 Long term (current) use of anticoagulants: Secondary | ICD-10-CM | POA: Insufficient documentation

## 2013-03-29 LAB — CBC
MCH: 30.4 pg (ref 26.0–34.0)
MCHC: 33.5 g/dL (ref 30.0–36.0)
Platelets: 137 10*3/uL — ABNORMAL LOW (ref 150–400)
RDW: 13.9 % (ref 11.5–15.5)

## 2013-03-29 LAB — BASIC METABOLIC PANEL
BUN: 34 mg/dL — ABNORMAL HIGH (ref 6–23)
Calcium: 10.2 mg/dL (ref 8.4–10.5)
Chloride: 96 mEq/L (ref 96–112)
GFR calc Af Amer: 32 mL/min — ABNORMAL LOW (ref >90)
GFR calc non Af Amer: 27 mL/min — ABNORMAL LOW (ref >90)
Potassium: 4.4 mEq/L (ref 3.5–5.1)
Sodium: 132 mEq/L — ABNORMAL LOW (ref 135–145)

## 2013-03-29 LAB — PROTIME-INR: INR: 1.11 (ref 0.00–1.49)

## 2013-03-29 LAB — TROPONIN I: Troponin I: <0.3 ng/mL (ref <0.30)

## 2013-03-29 LAB — PRO B NATRIURETIC PEPTIDE: Pro B Natriuretic peptide (BNP): 1458 pg/mL — ABNORMAL HIGH (ref 0–450)

## 2013-03-29 NOTE — ED Provider Notes (Signed)
CSN: [id number]     Arrival date & time 03/29/13  2039 History  This chart was scribed for Meghan Diego, MD by Jenne Campus, ED Scribe. This patient was seen in room APA02/APA02 and the patient's care was started at 9:17 PM.    Chief Complaint  Patient presents with  . Chest Pain  . Shortness of Breath    Patient is a 79 y.o. female presenting with chest pain. The history is provided by the patient. No language interpreter was used.  Chest Pain Pain location:  L chest Pain radiates to:  Does not radiate Pain radiates to the back: no   Onset quality:  Sudden Timing:  Constant Progression:  Resolved Context: at rest   Ineffective treatments:  None tried Associated symptoms: abdominal pain and shortness of breath   Associated symptoms: no back pain, no cough, no fatigue and no headache     HPI Comments: [name] is a 79 y.o. female who presents to the Emergency Department complaining of left-sided CP with associated SOB that started around 2 PM today. She states that she was watching soap operas at the time and the pain resolved before the show ended. She states that she then got up to make dinner when she developed non-specific abdominal pain and a short time later right arm pain. She states that currently all of the pains have subsided. She denies any prior episodes of the same; although, she has been seen in the ED several times before for CP.    Past Medical History  Diagnosis Date  . Tachycardia-bradycardia syndrome     with pauses and syncope;PPM implantation 10/2008,negative stress nuclear study 03/2005  . Atrial fibrillation     persistant  . Mitral valve disease     Severe mitral annular calcification; mild to moderate stenosis-not clearly rheumatic  . Hypertension     heart disease diastolic dysfunction ; 123456; mild pulmonary edema in 2006;responded to diurectics ; LVH  . Chronic renal insufficiency     baseline creatine 1.4; 1.04 in 03/2010  .  Hyperlipidemia   . Angioedema 08/2006  . Anemia     H/H of 11.3/36.7 in 12/2008 ;normal MCV; normal CBC in 2011  . Gout   . 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 Vertigo   . Shortness of breath   . Chronic bronchitis   . Blood transfusion   . DJD (degenerative joint disease)     hands, knees  . Seizure disorder   . Nephrolithiasis   . Stroke   . Retinal hemorrhage    Past Surgical History  Procedure Laterality Date  . Insert / replace / remove pacemaker  11/2008    St.Jude DUAL chamber pacemaker 11/06/2008  . Total knee arthroplasty  04/2009    Right  . Cataract extraction w/phaco  03/29/2011    Procedure: CATARACT EXTRACTION PHACO AND INTRAOCULAR LENS PLACEMENT (IOC);  Surgeon: Tonny Branch;  Location: [facility];  Service: Ophthalmology;  Laterality: Right;  CDE 12.62   Family History  Problem Relation Age of Onset  . Hypotension Neg Hx   . Anesthesia problems Neg Hx   . Pseudochol deficiency Neg Hx   . Malignant hyperthermia Neg Hx   . Cancer Other     unknown cancer   History  Substance Use Topics  .  Smoking status: Never Smoker   . Smokeless tobacco: Never Used  . Alcohol Use: No   No OB history provided.  Review of Systems  Constitutional: Negative for appetite change and fatigue.  HENT: Negative for congestion, ear discharge and sinus pressure.   Eyes: Negative for discharge.  Respiratory: Positive for shortness of breath. Negative for cough.   Cardiovascular: Positive for chest pain.  Gastrointestinal: Positive for abdominal pain. Negative for diarrhea.  Genitourinary: Negative for frequency and hematuria.  Musculoskeletal: Negative for back pain.  Skin: Negative for rash.  Neurological: Negative for seizures and headaches.  Psychiatric/Behavioral: Negative for  hallucinations.    Allergies  Review of patient's allergies indicates no known allergies.  Home Medications   Current Outpatient Rx  Name  Route  Sig  Dispense  Refill  . acetaminophen (TYLENOL) 500 MG tablet   Oral   Take 500 mg by mouth every 6 (six) hours as needed. Pain          . allopurinol (ZYLOPRIM) 100 MG tablet   Oral   Take 100 mg by mouth daily.           Marland Kitchen diltiazem (TIAZAC) 360 MG 24 hr capsule   Oral   Take 360 mg by mouth daily.         . dorzolamide-timolol (COSOPT) 22.3-6.8 MG/ML ophthalmic solution   Left Eye   Place 1 drop into the left eye at bedtime.          . furosemide (LASIX) 40 MG tablet   Oral   Take 40 mg by mouth daily as needed for fluid.          Marland Kitchen lisinopril (PRINIVIL,ZESTRIL) 20 MG tablet   Oral   Take 20 mg by mouth daily.         . meclizine (ANTIVERT) 25 MG tablet   Oral   Take 25 mg by mouth 3 (three) times daily as needed. Dizziness         . methocarbamol (ROBAXIN) 500 MG tablet   Oral   Take 1 tablet (500 mg total) by mouth every 6 (six) hours as needed.   40 tablet   0   . ondansetron (ZOFRAN) 4 MG tablet   Oral   Take 1 tablet (4 mg total) by mouth every 8 (eight) hours as needed for nausea.   6 tablet   0   . pravastatin (PRAVACHOL) 80 MG tablet   Oral   Take 80 mg by mouth at bedtime.         . ranitidine (ZANTAC) 150 MG tablet   Oral   Take 150 mg by mouth at bedtime.          Marland Kitchen warfarin (COUMADIN) 5 MG tablet   Oral   Take 0.5 tablets (2.5 mg total) by mouth daily.   45 tablet   3    Triage Vitals: BP 126/76  Pulse 76  Temp(Src) 97.6 F (36.4 C) (Oral)  Resp 16  Ht 5\' 2"  (1.575 m)  Wt 177 lb (80.287 kg)  BMI 32.37 kg/m2  SpO2 98%  Physical Exam  Nursing note and vitals reviewed. Constitutional: She is oriented to person, place, and time. She appears well-developed.  HENT:  Head: Normocephalic.  Eyes: Conjunctivae and EOM are normal. No scleral icterus.  Neck: Neck supple.  No thyromegaly present.  Cardiovascular: Normal rate.  An irregular rhythm present. Exam reveals no gallop and no friction rub.   No murmur heard. Pulmonary/Chest: No stridor.  She has no wheezes. She has no rales. She exhibits no tenderness.  Abdominal: She exhibits no distension. There is no tenderness. There is no rebound.  Musculoskeletal: Normal range of motion. She exhibits no edema.  Lymphadenopathy:    She has no cervical adenopathy.  Neurological: She is oriented to person, place, and time. She exhibits normal muscle tone. Coordination normal.  Skin: No rash noted. No erythema.  Psychiatric: She has a normal mood and affect. Her behavior is normal.    ED Course  Procedures (including critical care time)  DIAGNOSTIC STUDIES: Oxygen Saturation is 98% on room air, normal by my interpretation.    COORDINATION OF CARE: 9:20 PM-Discussed treatment plan which includes CXR, CBC panel, BMP and troponin with pt at bedside and pt agreed to plan.   10:45 PM-Informed pt of normal radiology and lab work results. Discussed discharge plan which includes follow up with her PCP next week as needed and pt agreed. Addressed symptoms to return for with pt.   Labs Review Labs Reviewed  CBC - Abnormal; Notable for the following:    Hemoglobin 15.1 (*)    Platelets 137 (*)    All other components within normal limits  BASIC METABOLIC PANEL - Abnormal; Notable for the following:    Sodium 132 (*)    Glucose, Bld 148 (*)    BUN 34 (*)    Creatinine, Ser 1.66 (*)    GFR calc non Af Amer 27 (*)    GFR calc Af Amer 32 (*)    All other components within normal limits  PRO B NATRIURETIC PEPTIDE - Abnormal; Notable for the following:    Pro B Natriuretic peptide (BNP) 1458.0 (*)    All other components within normal limits  TROPONIN I  PROTIME-INR   Imaging Review Dg Chest Port 1 View  03/29/2013   CLINICAL DATA:  Chest pain and shortness of breath.  EXAM: PORTABLE CHEST - 1 VIEW  COMPARISON:   03/18/2013  FINDINGS: Stable appearance of cardiac pacemaker. Shallow inspiration. Borderline heart size is probably normal for technique. Pulmonary vascularity is normal. No focal airspace consolidation in the lungs. No blunting of costophrenic angles. No pneumothorax. Degenerative changes in the shoulders.  IMPRESSION: No active disease.   Electronically Signed   By: Lucienne Capers M.D.   On: 03/29/2013 21:17   Dg Abd Acute W/chest  03/29/2013   CLINICAL DATA:  Left-sided chest pain, shortness of breath, and weakness tonight.  EXAM: ACUTE ABDOMEN SERIES (ABDOMEN 2 VIEW & CHEST 1 VIEW)  COMPARISON:  Chest 03/29/2013  FINDINGS: Stable appearance of cardiac pacemaker. Normal heart size and pulmonary vascularity. No focal airspace consolidation in the lungs. No blunting of costophrenic angles. No pneumothorax. Calcification of the mitral valve annulus. Stable appearance since previous chest radiograph.  Normal bowel gas pattern with scattered gas and stool in the colon. No small or large bowel distention. No free intra-abdominal air. No abnormal air-fluid levels. No radiopaque stones. Calcifications of the pelvis consistent with calcified fibroids. Degenerative changes in the spine and hips.  IMPRESSION: No evidence of active pulmonary disease. Nonobstructive bowel gas pattern.   Electronically Signed   By: Lucienne Capers M.D.   On: 03/29/2013 22:05    EKG Interpretation     Ventricular Rate:  76 PR Interval:    QRS Duration: 66 QT Interval:  392 QTC Calculation: 441 R Axis:   -36 Text Interpretation:  Atrial fibrillation with a competing junctional pacemaker Left axis deviation Low voltage QRS  Inferior infarct , age undetermined Cannot rule out Anterior infarct , age undetermined Abnormal ECG When compared with ECG of 18-Mar-2013 16:56, Atrial fibrillation has replaced Electronic ventricular pacemaker            MDM  No diagnosis found.   The chart was scribed for me under my direct  supervision.  I personally performed the history, physical, and medical decision making and all procedures in the evaluation of this patient.Meghan Diego, MD 03/29/13 2258

## 2013-03-29 NOTE — ED Notes (Signed)
Patient complains of left-sided chest pain and heaviness and shortness of breath. Reports symptoms started at [place]. States also had numbness and tingling to right arm. Daughter also reports patient had a blank stare when all of these symptoms started.

## 2013-03-29 NOTE — ED Notes (Signed)
Pt alert & oriented x4, stable gait. Patient  given discharge instructions, paperwork & prescription(s). Patient left department by wheelchair. Parent verbalized understanding. Pt left department w/ no further questions.

## 2013-04-03 ENCOUNTER — Encounter: Payer: Self-pay | Admitting: *Deleted

## 2013-04-03 ENCOUNTER — Encounter: Payer: Medicare Other | Admitting: Adult Health

## 2013-04-03 ENCOUNTER — Encounter: Payer: Self-pay | Admitting: Adult Health

## 2013-04-03 ENCOUNTER — Ambulatory Visit (INDEPENDENT_AMBULATORY_CARE_PROVIDER_SITE_OTHER): Payer: Medicare Other | Admitting: Adult Health

## 2013-04-03 VITALS — BP 121/71 | HR 80 | Ht 62.0 in | Wt 189.0 lb

## 2013-04-03 DIAGNOSIS — I495 Sick sinus syndrome: Secondary | ICD-10-CM

## 2013-04-03 DIAGNOSIS — R55 Syncope and collapse: Secondary | ICD-10-CM

## 2013-04-03 DIAGNOSIS — R079 Chest pain, unspecified: Secondary | ICD-10-CM

## 2013-04-03 DIAGNOSIS — I4891 Unspecified atrial fibrillation: Secondary | ICD-10-CM

## 2013-04-03 DIAGNOSIS — I10 Essential (primary) hypertension: Secondary | ICD-10-CM

## 2013-04-03 MED ORDER — FUROSEMIDE 20 MG PO TABS: 20.0000 mg | ORAL_TABLET | Freq: Every day | ORAL | Status: DC

## 2013-04-03 NOTE — Assessment & Plan Note (Signed)
She is followed by Dr. Rayann Heman and Dr. Crissie Sickles. During hospitalization in the ER, the patient did have her pacemaker interrogated and was found to functioning appropriately. A letter was sent to the patient from the [place] office pacemaker clinic stating so. Will not make any changes in her medication regimen at this time. EKG reveals atrial fibrillation with a rate of 77 beats per minute.

## 2013-04-03 NOTE — Progress Notes (Signed)
HPI: [name] is an 79 year old former patient of Dr. Lattie Haw, who will now be a sign to Dr. Domenic Polite, we are following for ongoing assessment and management of atrial fibrillation, on chronic anticoagulation. Other history includes hypertension, sick sinus syndrome, status post permanent pacemaker, and hyperlipidemia. She was last seen by Dr. Lattie Haw in June of 2014 was found to be stable. Blood pressure and heart rate were well controlled.   On 03/29/2013 the patient was seen in the emergency room with complaints of chest pain. She also had complaints of shortness of breath. Pain resolved on its own but reoccurred. The patient was not found to have any lab abnormalities or evidence of pneumonia or CHF on chest x-ray. She was released and referred back to primary care and cardiology.  No Known Allergies  Current Outpatient Prescriptions  Medication Sig Dispense Refill  . allopurinol (ZYLOPRIM) 100 MG tablet Take 100 mg by mouth daily.        Marland Kitchen diltiazem (TIAZAC) 360 MG 24 hr capsule Take 360 mg by mouth daily.      . dorzolamide-timolol (COSOPT) 22.3-6.8 MG/ML ophthalmic solution Place 1 drop into the left eye at bedtime.       . furosemide (LASIX) 40 MG tablet Take 40 mg by mouth daily as needed for fluid.       Marland Kitchen lisinopril (PRINIVIL,ZESTRIL) 20 MG tablet Take 20 mg by mouth daily.      . meclizine (ANTIVERT) 25 MG tablet Take 25 mg by mouth 3 (three) times daily as needed. Dizziness      . pravastatin (PRAVACHOL) 80 MG tablet Take 80 mg by mouth at bedtime.      . ranitidine (ZANTAC) 150 MG tablet Take 150 mg by mouth at bedtime.       Marland Kitchen warfarin (COUMADIN) 5 MG tablet Take 0.5 tablets (2.5 mg total) by mouth daily.  45 tablet  3  . [DISCONTINUED] colchicine 0.6 MG tablet Take 0.6 mg by mouth daily as needed. Gout Flare Ups      . [DISCONTINUED] diltiazem (CARDIZEM CD) 360 MG 24 hr capsule TAKE (1) CAPSULE BY MOUTH ONCE DAILY.  30 capsule  5  . [DISCONTINUED] simvastatin (ZOCOR)  40 MG tablet Take 40 mg by mouth at bedtime.         No current facility-administered medications for this visit.    Past Medical History  Diagnosis Date  . Tachycardia-bradycardia syndrome     with pauses and syncope;PPM implantation 10/2008,negative stress nuclear study 03/2005  . Atrial fibrillation     persistant  . Mitral valve disease     Severe mitral annular calcification; mild to moderate stenosis-not clearly rheumatic  . Hypertension     heart disease diastolic dysfunction ; 123456; mild pulmonary edema in 2006;responded to diurectics ; LVH  . Chronic renal insufficiency     baseline creatine 1.4; 1.04 in 03/2010  . Hyperlipidemia   . Angioedema 08/2006  . Anemia     H/H of 11.3/36.7 in 12/2008 ;normal MCV; normal CBC in 2011  . Gout   . 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 Vertigo   . Shortness of breath   . Chronic bronchitis   . Blood transfusion   . DJD (degenerative joint disease)     hands, knees  . Seizure  disorder   . Nephrolithiasis   . Stroke   . Retinal hemorrhage     Past Surgical History  Procedure Laterality Date  . Insert / replace / remove pacemaker  11/2008    [employer]ude DUAL chamber pacemaker 11/06/2008  . Total knee arthroplasty  04/2009    Right  . Cataract extraction w/phaco  03/29/2011    Procedure: CATARACT EXTRACTION PHACO AND INTRAOCULAR LENS PLACEMENT (IOC);  Surgeon: Tonny Branch;  Location: [facility];  Service: Ophthalmology;  Laterality: Right;  CDE 12.62    ROS: PHYSICAL EXAM There were no vitals taken for this visit.  EKG:  ASSESSMENT AND PLAN

## 2013-04-03 NOTE — Patient Instructions (Addendum)
Your physician recommends that you schedule a follow-up appointment in: post stress test  Your physician has requested that you have an echocardiogram. Echocardiography is a painless test that uses sound waves to create images of your heart. It provides your doctor with information about the size and shape of your heart and how well your heart's chambers and valves are working. This procedure takes approximately one hour. There are no restrictions for this procedure.   Your physician has requested that you have a lexiscan myoview. For further information please visit [email or web address]. Please follow instruction sheet, as given.  Your physician recommends that you return for lab work this week. BMET  Your physician has recommended you make the following change in your medication:  1. Decreased lasix to 20 mg daily

## 2013-04-03 NOTE — Assessment & Plan Note (Signed)
Heart rate is well-controlled currently. She remains on warfarin. She is followed at our [place] office.

## 2013-04-03 NOTE — Progress Notes (Signed)
HPI: [name] is an 79 y/o patient formerly of Dr.Rothbart we are following for ongoing assessment and treatment of atrial fib, hypertension, and SSS s/p pacemaker placement. She comes today after having chest pain and dizziness with evaluation in the ER. She states she was standing at the stove cooking when she had dull ache in the left side of her chest, went to her stomache, and then up to her head with near syncope and shortness of breath. In ER she was negative for PE, Na was 132, Pro-BNP 1458. Creatinine 136. She was told she was dehydrated. She stopped taking her lasix for a couple of days, felt better, and has since restarted it.   No Known Allergies  Current Outpatient Prescriptions  Medication Sig Dispense Refill  . allopurinol (ZYLOPRIM) 100 MG tablet Take 100 mg by mouth daily.        Marland Kitchen diltiazem (TIAZAC) 360 MG 24 hr capsule Take 360 mg by mouth daily.      . dorzolamide-timolol (COSOPT) 22.3-6.8 MG/ML ophthalmic solution Place 1 drop into the left eye at bedtime.       . furosemide (LASIX) 40 MG tablet Take 40 mg by mouth daily as needed for fluid.       Marland Kitchen lisinopril (PRINIVIL,ZESTRIL) 20 MG tablet Take 20 mg by mouth daily.      . meclizine (ANTIVERT) 25 MG tablet Take 25 mg by mouth 3 (three) times daily as needed. Dizziness      . pravastatin (PRAVACHOL) 80 MG tablet Take 80 mg by mouth at bedtime.      . ranitidine (ZANTAC) 150 MG tablet Take 150 mg by mouth at bedtime.       Marland Kitchen warfarin (COUMADIN) 5 MG tablet Take 0.5 tablets (2.5 mg total) by mouth daily.  45 tablet  3  . [DISCONTINUED] colchicine 0.6 MG tablet Take 0.6 mg by mouth daily as needed. Gout Flare Ups      . [DISCONTINUED] diltiazem (CARDIZEM CD) 360 MG 24 hr capsule TAKE (1) CAPSULE BY MOUTH ONCE DAILY.  30 capsule  5  . [DISCONTINUED] simvastatin (ZOCOR) 40 MG tablet Take 40 mg by mouth at bedtime.         No current facility-administered medications for this visit.    Past Medical History  Diagnosis  Date  . Tachycardia-bradycardia syndrome     with pauses and syncope;PPM implantation 10/2008,negative stress nuclear study 03/2005  . Atrial fibrillation     persistant  . Mitral valve disease     Severe mitral annular calcification; mild to moderate stenosis-not clearly rheumatic  . Hypertension     heart disease diastolic dysfunction ; 123456; mild pulmonary edema in 2006;responded to diurectics ; LVH  . Chronic renal insufficiency     baseline creatine 1.4; 1.04 in 03/2010  . Hyperlipidemia   . Angioedema 08/2006  . Anemia     H/H of 11.3/36.7 in 12/2008 ;normal MCV; normal CBC in 2011  . Gout   . 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 Vertigo   . Shortness of breath   . Chronic bronchitis   . Blood transfusion   . DJD (degenerative joint disease)     hands, knees  . Seizure disorder   . Nephrolithiasis   . Stroke   . Retinal hemorrhage  Past Surgical History  Procedure Laterality Date  . Insert / replace / remove pacemaker  11/2008    [employer]ude DUAL chamber pacemaker 11/06/2008  . Total knee arthroplasty  04/2009    Right  . Cataract extraction w/phaco  03/29/2011    Procedure: CATARACT EXTRACTION PHACO AND INTRAOCULAR LENS PLACEMENT (IOC);  Surgeon: Tonny Branch;  Location: [facility];  Service: Ophthalmology;  Laterality: Right;  CDE 12.62    ROS: Review of systems complete and found to be negative unless listed above  PHYSICAL EXAM BP 121/71  Pulse 80  Ht 5\' 2"  (1.575 m)  Wt 189 lb (85.73 kg)  BMI 34.56 kg/m2  General: Well developed, well nourished, in no acute distress Head: Eyes PERRLA, No xanthomas.   Normal cephalic and atramatic  Lungs: Clear bilaterally to auscultation and percussion. Heart: HRRR S1 S2, without MRG.  Pulses are 2+ & equal.            No  carotid bruit. No JVD.  No abdominal bruits. No femoral bruits. Abdomen: Bowel sounds are positive, abdomen soft and non-tender without masses or                  Hernia's noted. Msk:  Back normal, normal gait. Normal strength and tone for age. Extremities: No clubbing, cyanosis or edema.  DP +1 Neuro: Alert and oriented X 3. Psych:  Good affect, responds appropriately  :  ASSESSMENT AND PLAN

## 2013-04-03 NOTE — Progress Notes (Deleted)
Name: [name]    DOB: Nov 10, 1926  Age: 79 y.o.  MR#: [id number]       PCP:  Leonides Grills, MD      Insurance: Payor: [place] / Plan: BLUE MEDICARE / Product Type: *No Product type* /   CC:   No chief complaint on file.   VS Filed Vitals:   04/03/13 1414  BP: 121/71  Pulse: 80  Height: 5\' 2"  (1.575 m)  Weight: 189 lb (85.73 kg)    Weights Current Weight  04/03/13 189 lb (85.73 kg)  03/29/13 177 lb (80.287 kg)  02/14/13 198 lb (89.812 kg)    Blood Pressure  BP Readings from Last 3 Encounters:  04/03/13 121/71  03/29/13 125/67  03/18/13 145/59     Admit date:  (Not on file) Last encounter with RMR:  Visit date not found   Allergy Review of patient's allergies indicates no known allergies.  Current Outpatient Prescriptions  Medication Sig Dispense Refill  . allopurinol (ZYLOPRIM) 100 MG tablet Take 100 mg by mouth daily.        Marland Kitchen diltiazem (TIAZAC) 360 MG 24 hr capsule Take 360 mg by mouth daily.      . dorzolamide-timolol (COSOPT) 22.3-6.8 MG/ML ophthalmic solution Place 1 drop into the left eye at bedtime.       . furosemide (LASIX) 40 MG tablet Take 40 mg by mouth daily as needed for fluid.       Marland Kitchen lisinopril (PRINIVIL,ZESTRIL) 20 MG tablet Take 20 mg by mouth daily.      . meclizine (ANTIVERT) 25 MG tablet Take 25 mg by mouth 3 (three) times daily as needed. Dizziness      . pravastatin (PRAVACHOL) 80 MG tablet Take 80 mg by mouth at bedtime.      . ranitidine (ZANTAC) 150 MG tablet Take 150 mg by mouth at bedtime.       Marland Kitchen warfarin (COUMADIN) 5 MG tablet Take 0.5 tablets (2.5 mg total) by mouth daily.  45 tablet  3  . [DISCONTINUED] colchicine 0.6 MG tablet Take 0.6 mg by mouth daily as needed. Gout Flare Ups      . [DISCONTINUED] diltiazem (CARDIZEM CD) 360 MG 24 hr capsule TAKE (1) CAPSULE BY MOUTH ONCE DAILY.  30 capsule  5  . [DISCONTINUED] simvastatin (ZOCOR) 40 MG tablet Take 40 mg by mouth at bedtime.         No current  facility-administered medications for this visit.    Discontinued Meds:   There are no discontinued medications.  Patient Active Problem List   Diagnosis Date Noted  . Gout 11/14/2012  . Fasting hyperglycemia 04/06/2012  . Atrial fibrillation 08/31/2011  . Chronic anticoagulation 12/16/2010  . 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 Cerebrovascular accident   . DJD (degenerative joint disease)   . Hypertension 02/21/2009  . Sick sinus syndrome 02/21/2009  . Hyperlipidemia 11/26/2008  . SEIZURE DISORDER 10/31/2008    LABS    Component Value Date/Time   NA 132* 03/29/2013 2102   NA 135 03/18/2013 1652   NA 140 11/14/2012 1210   K 4.4 03/29/2013 2102   K 3.8 03/18/2013 1652   K 4.3 11/14/2012 1210   CL 96 03/29/2013 2102   CL 98 03/18/2013 1652   CL 104 11/14/2012 1210   CO2 24 03/29/2013 2102   CO2 26 03/18/2013 1652   CO2 27 11/14/2012 1210   GLUCOSE 148* 03/29/2013 2102   GLUCOSE 160* 03/18/2013 1652  GLUCOSE 93 11/14/2012 1210   BUN 34* 03/29/2013 2102   BUN 32* 03/18/2013 1652   BUN 17 11/14/2012 1210   CREATININE 1.66* 03/29/2013 2102   CREATININE 1.61* 03/18/2013 1652   CREATININE 1.16* 11/14/2012 1210   CREATININE 1.09 07/24/2012 1237   CREATININE 1.22* 06/22/2012 1225   CREATININE 1.00 12/07/2011 0944   CALCIUM 10.2 03/29/2013 2102   CALCIUM 10.9* 03/18/2013 1652   CALCIUM 9.8 11/14/2012 1210   GFRNONAA 27* 03/29/2013 2102   GFRNONAA 28* 03/18/2013 1652   GFRNONAA 51* 12/07/2011 0944   GFRAA 32* 03/29/2013 2102   GFRAA 33* 03/18/2013 1652   GFRAA 59* 12/07/2011 0944   CMP     Component Value Date/Time   NA 132* 03/29/2013 2102   K 4.4 03/29/2013 2102   CL 96 03/29/2013 2102   CO2 24 03/29/2013 2102   GLUCOSE 148* 03/29/2013 2102   BUN 34* 03/29/2013 2102   CREATININE 1.66* 03/29/2013 2102   CREATININE 1.16* 11/14/2012 1210   CALCIUM 10.2 03/29/2013 2102   PROT 6.8 11/14/2012 1210   ALBUMIN 3.8 11/14/2012 1210   AST 17 11/14/2012 1210   ALT 11 11/14/2012 1210   ALKPHOS 96  11/14/2012 1210   BILITOT 0.5 11/14/2012 1210   GFRNONAA 27* 03/29/2013 2102   GFRAA 32* 03/29/2013 2102       Component Value Date/Time   WBC 8.5 03/29/2013 2102   WBC 10.7* 03/18/2013 1652   WBC 5.6 11/14/2012 1210   HGB 15.1* 03/29/2013 2102   HGB 15.3* 03/18/2013 1652   HGB 13.9 11/14/2012 1210   HCT 45.1 03/29/2013 2102   HCT 45.9 03/18/2013 1652   HCT 41.0 11/14/2012 1210   MCV 90.9 03/29/2013 2102   MCV 90.7 03/18/2013 1652   MCV 88.0 11/14/2012 1210    Lipid Panel     Component Value Date/Time   CHOL 143 03/30/2010 2219   TRIG 85 03/30/2010 2219   HDL 76 03/30/2010 2219   CHOLHDL 1.9 Ratio 03/30/2010 2219   VLDL 17 03/30/2010 2219   LDLCALC 50 03/30/2010 2219    ABG    Component Value Date/Time   PHART 7.337* 10/05/2008 1450   PCO2ART 39.7 10/05/2008 1450   PO2ART 129.0* 10/05/2008 1450   HCO3 20.7 10/05/2008 1450   TCO2 19.3 10/05/2008 1450   ACIDBASEDEF 4.1* 10/05/2008 1450   O2SAT 98.7 10/05/2008 1450     Lab Results  Component Value Date   TSH 1.131 09/11/2009   BNP (last 3 results)  Recent Labs  03/18/13 1652 03/29/13 2111  PROBNP 1435.0* 1458.0*   Cardiac Panel (last 3 results) No results found for this basename: CKTOTAL, CKMB, TROPONINI, RELINDX,  in the last 72 hours  Iron/TIBC/Ferritin    Component Value Date/Time   IRON 67 11/06/2008 0610   TIBC 229* 11/06/2008 0610     EKG Orders placed during the hospital encounter of 03/29/13  . ED EKG  . ED EKG  . EKG 12-LEAD  . EKG 12-LEAD  . EKG     Prior Assessment and Plan Problem List as of 04/03/2013     Cardiovascular and Mediastinum   Hypertension   Last Assessment & Plan   11/14/2012 Office Visit Written 11/14/2012 12:14 PM by Yehuda Savannah, MD     Blood pressure control has improved following the addition of ACE inhibitor. There has been no deterioration in renal function. Current medical regime will be maintained.    Sick sinus syndrome   Last Assessment & Plan  03/08/2012 Office Visit  Written 03/08/2012  1:14 PM by Yehuda Savannah, MD     Explanation for recent dizziness is unclear.  No significant orthostatic change in blood pressure noted.    Cerebrovascular accident   Atrial fibrillation   Last Assessment & Plan   11/14/2012 Office Visit Written 11/14/2012 11:38 AM by Yehuda Savannah, MD     Strategy of rate control and anticoagulation has been effective and will be continued.      Endocrine   Fasting hyperglycemia     Musculoskeletal and Integument   DJD (degenerative joint disease)     Other   Hyperlipidemia   Last Assessment & Plan   09/09/2011 Office Visit Edited 09/09/2011  2:44 PM by Yehuda Savannah, MD     Lipid profile remains excellent with current therapy, which will be continued.    SEIZURE DISORDER   PPM-St.Jude   Last Assessment & Plan   08/17/2012 Office Visit Written 08/17/2012 10:02 AM by Evans Lance, MD     Her St. Jude dual-chamber pacemaker is working normally. She is now chronically in atrial fibrillation, we have reprogrammed her device to the VVIR pacing mode. We'll plan to recheck in several months.    Chronic anticoagulation   Last Assessment & Plan   11/14/2012 Office Visit Written 11/14/2012 11:36 AM by Yehuda Savannah, MD     Patient has done well on warfarin with no manifest or occult bleeding.  We will continue to monitor CBCs and FOBT.    Gout       Imaging: Dg Chest Port 1 View  03/29/2013   CLINICAL DATA:  Chest pain and shortness of breath.  EXAM: PORTABLE CHEST - 1 VIEW  COMPARISON:  03/18/2013  FINDINGS: Stable appearance of cardiac pacemaker. Shallow inspiration. Borderline heart size is probably normal for technique. Pulmonary vascularity is normal. No focal airspace consolidation in the lungs. No blunting of costophrenic angles. No pneumothorax. Degenerative changes in the shoulders.  IMPRESSION: No active disease.   Electronically Signed   By: Lucienne Capers M.D.   On: 03/29/2013 21:17   Dg Chest Portable 1  View  03/18/2013   CLINICAL DATA:  Loss of consciousness.  EXAM: PORTABLE CHEST - 1 VIEW  COMPARISON:  12/07/2011.  FINDINGS: Cardiomegaly. Intact dual lead pacemaker with leads in good position. Mild vascular congestion. No infiltrates or pneumothorax. Severe degenerative change both shoulders.  IMPRESSION: Cardiomegaly with mild vascular congestion. Worsening aeration from priors.   Electronically Signed   By: Rolla Flatten M.D.   On: 03/18/2013 17:31   Dg Abd Acute W/chest  03/29/2013   CLINICAL DATA:  Left-sided chest pain, shortness of breath, and weakness tonight.  EXAM: ACUTE ABDOMEN SERIES (ABDOMEN 2 VIEW & CHEST 1 VIEW)  COMPARISON:  Chest 03/29/2013  FINDINGS: Stable appearance of cardiac pacemaker. Normal heart size and pulmonary vascularity. No focal airspace consolidation in the lungs. No blunting of costophrenic angles. No pneumothorax. Calcification of the mitral valve annulus. Stable appearance since previous chest radiograph.  Normal bowel gas pattern with scattered gas and stool in the colon. No small or large bowel distention. No free intra-abdominal air. No abnormal air-fluid levels. No radiopaque stones. Calcifications of the pelvis consistent with calcified fibroids. Degenerative changes in the spine and hips.  IMPRESSION: No evidence of active pulmonary disease. Nonobstructive bowel gas pattern.   Electronically Signed   By: Lucienne Capers M.D.   On: 03/29/2013 22:05

## 2013-04-03 NOTE — Assessment & Plan Note (Signed)
She has been having episodes of dizziness, with chest discomfort, with near syncope. Patient's blood pressure is normal. She is not orthostatic. I will check a BMET, and a pro BNP, as her proBNP was elevated in the emergency room. I will recheck an echocardiogram and a stress Myoview, using Lexiscan for recurrent chest pain and for LV function.

## 2013-04-09 ENCOUNTER — Encounter (HOSPITAL_COMMUNITY)
Admission: RE | Admit: 2013-04-09 | Discharge: 2013-04-09 | Disposition: A | Discharge status: Home / Self Care | Payer: Medicare Other | Source: Ambulatory Visit | Attending: Adult Health | Admitting: Adult Health

## 2013-04-09 ENCOUNTER — Encounter (HOSPITAL_COMMUNITY): Payer: Self-pay

## 2013-04-09 ENCOUNTER — Ambulatory Visit (HOSPITAL_COMMUNITY)
Admission: RE | Admit: 2013-04-09 | Discharge: 2013-04-09 | Disposition: A | Discharge status: Home / Self Care | Payer: Medicare Other | Source: Ambulatory Visit | Attending: Adult Health | Admitting: Adult Health

## 2013-04-09 DIAGNOSIS — R079 Chest pain, unspecified: Secondary | ICD-10-CM

## 2013-04-09 DIAGNOSIS — Z6834 Body mass index (BMI) 34.0-34.9, adult: Secondary | ICD-10-CM | POA: Insufficient documentation

## 2013-04-09 DIAGNOSIS — I4891 Unspecified atrial fibrillation: Secondary | ICD-10-CM

## 2013-04-09 DIAGNOSIS — R55 Syncope and collapse: Secondary | ICD-10-CM

## 2013-04-09 DIAGNOSIS — I495 Sick sinus syndrome: Secondary | ICD-10-CM | POA: Insufficient documentation

## 2013-04-09 DIAGNOSIS — I10 Essential (primary) hypertension: Secondary | ICD-10-CM | POA: Insufficient documentation

## 2013-04-09 DIAGNOSIS — I059 Rheumatic mitral valve disease, unspecified: Secondary | ICD-10-CM

## 2013-04-09 MED ORDER — SODIUM CHLORIDE 0.9 % IJ SOLN
INTRAMUSCULAR | Status: AC
  Administered 2013-04-09: 10 mL via INTRAVENOUS
  Filled 2013-04-09: qty 10

## 2013-04-09 MED ORDER — REGADENOSON 0.4 MG/5ML IV SOLN
INTRAVENOUS | Status: AC
  Administered 2013-04-09: 0.4 mg via INTRAVENOUS
  Filled 2013-04-09: qty 5

## 2013-04-09 MED ORDER — TECHNETIUM TC 99M SESTAMIBI - CARDIOLITE
30.0000 | Freq: Once | INTRAVENOUS | Status: AC | PRN
  Administered 2013-04-09: 10:00:00 30 via INTRAVENOUS

## 2013-04-09 MED ORDER — TECHNETIUM TC 99M SESTAMIBI - CARDIOLITE
10.0000 | Freq: Once | INTRAVENOUS | Status: AC | PRN
  Administered 2013-04-09: 08:00:00 10 via INTRAVENOUS

## 2013-04-09 NOTE — Progress Notes (Signed)
*  PRELIMINARY RESULTS* Echocardiogram 2D Echocardiogram has been performed.  [place], [place] 04/09/2013, 1:16 PM

## 2013-04-09 NOTE — Progress Notes (Signed)
Stress Lab Nurses Notes - [facility] 04/09/2013 Reason for doing test: Chest Pain and AFib Type of test: Wille Glaser Nurse performing test: Gerrit Halls, RN Nuclear Medicine Tech: Dyanne Carrel Echo Tech: Not Applicable MD performing test: Dr. Harl Bowie / K.Lawrence NP Family MD: [place] Test explained and consent signed: yes IV started: 22g jelco, Saline lock flushed, No redness or edema and Saline lock started in radiology Symptoms: stomach discomfort Treatment/Intervention: None Reason test stopped: protocol completed After recovery IV was: Discontinued via X-ray tech and No redness or edema Patient to return to [facility] at : 10:30 Patient discharged: Home Patient's Condition upon discharge was: stable Comments: During test BP 154/77 & HR 90. Recovery BP 149/71 & HR 83 .  Symptoms resolved in recovery. Geanie Cooley T

## 2013-04-12 ENCOUNTER — Encounter: Payer: Self-pay | Admitting: Adult Health

## 2013-04-12 ENCOUNTER — Ambulatory Visit (INDEPENDENT_AMBULATORY_CARE_PROVIDER_SITE_OTHER): Payer: Medicare Other | Admitting: *Deleted

## 2013-04-12 ENCOUNTER — Ambulatory Visit (INDEPENDENT_AMBULATORY_CARE_PROVIDER_SITE_OTHER): Payer: Medicare Other | Admitting: Adult Health

## 2013-04-12 VITALS — BP 146/70 | HR 86 | Ht 62.0 in | Wt 161.0 lb

## 2013-04-12 DIAGNOSIS — Z950 Presence of cardiac pacemaker: Secondary | ICD-10-CM

## 2013-04-12 DIAGNOSIS — I4891 Unspecified atrial fibrillation: Secondary | ICD-10-CM

## 2013-04-12 DIAGNOSIS — I639 Cerebral infarction, unspecified: Secondary | ICD-10-CM

## 2013-04-12 DIAGNOSIS — I6350 Cerebral infarction due to unspecified occlusion or stenosis of unspecified cerebral artery: Secondary | ICD-10-CM

## 2013-04-12 DIAGNOSIS — I495 Sick sinus syndrome: Secondary | ICD-10-CM

## 2013-04-12 DIAGNOSIS — Z7901 Long term (current) use of anticoagulants: Secondary | ICD-10-CM

## 2013-04-12 DIAGNOSIS — I10 Essential (primary) hypertension: Secondary | ICD-10-CM

## 2013-04-12 LAB — BASIC METABOLIC PANEL
BUN: 20 mg/dL (ref 6–23)
Calcium: 9.8 mg/dL (ref 8.4–10.5)
Chloride: 107 mEq/L (ref 96–112)
Creat: 1.17 mg/dL — ABNORMAL HIGH (ref 0.50–1.10)
Glucose, Bld: 95 mg/dL (ref 70–99)
Potassium: 4.9 mEq/L (ref 3.5–5.3)

## 2013-04-12 LAB — POCT INR: INR: 2.5

## 2013-04-12 NOTE — Assessment & Plan Note (Signed)
Heart rate is well controlled. No changes in medications. INR was checked today. Dosing of coumadin is done as well. She will see [place] in 6 months unless symptomatic.

## 2013-04-12 NOTE — Progress Notes (Deleted)
Name: [name]    DOB: 27-Oct-1926  Age: 79 y.o.  MR#: [id number]       PCP:  Leonides Grills, MD      Insurance: Payor: [place] / Plan: BLUE MEDICARE / Product Type: *No Product type* /   CC:    Chief Complaint  Patient presents with  . Atrial Fibrillation  . Hypertension    VS Filed Vitals:   04/12/13 1336  BP: 146/70  Pulse: 86  Height: 5\' 2"  (1.575 m)  Weight: 161 lb (73.029 kg)    Weights Current Weight  04/12/13 161 lb (73.029 kg)  04/03/13 189 lb (85.73 kg)  03/29/13 177 lb (80.287 kg)    Blood Pressure  BP Readings from Last 3 Encounters:  04/12/13 146/70  04/03/13 121/71  03/29/13 125/67     Admit date:  (Not on file) Last encounter with RMR:  04/03/2013   Allergy Review of patient's allergies indicates no known allergies.  Current Outpatient Prescriptions  Medication Sig Dispense Refill  . allopurinol (ZYLOPRIM) 100 MG tablet Take 100 mg by mouth daily.        Marland Kitchen diltiazem (TIAZAC) 360 MG 24 hr capsule Take 360 mg by mouth daily.      . dorzolamide-timolol (COSOPT) 22.3-6.8 MG/ML ophthalmic solution Place 1 drop into the left eye at bedtime.       . furosemide (LASIX) 20 MG tablet Take 1 tablet (20 mg total) by mouth daily.  30 tablet  6  . lisinopril (PRINIVIL,ZESTRIL) 20 MG tablet Take 20 mg by mouth daily.      . meclizine (ANTIVERT) 25 MG tablet Take 25 mg by mouth 3 (three) times daily as needed. Dizziness      . pravastatin (PRAVACHOL) 80 MG tablet Take 80 mg by mouth at bedtime.      . ranitidine (ZANTAC) 150 MG tablet Take 150 mg by mouth at bedtime.       Marland Kitchen warfarin (COUMADIN) 5 MG tablet Take 0.5 tablets (2.5 mg total) by mouth daily.  45 tablet  3  . [DISCONTINUED] colchicine 0.6 MG tablet Take 0.6 mg by mouth daily as needed. Gout Flare Ups      . [DISCONTINUED] diltiazem (CARDIZEM CD) 360 MG 24 hr capsule TAKE (1) CAPSULE BY MOUTH ONCE DAILY.  30 capsule  5  . [DISCONTINUED] simvastatin (ZOCOR) 40 MG tablet  Take 40 mg by mouth at bedtime.         No current facility-administered medications for this visit.    Discontinued Meds:   There are no discontinued medications.  Patient Active Problem List   Diagnosis Date Noted  . Gout 11/14/2012  . Fasting hyperglycemia 04/06/2012  . Atrial fibrillation 08/31/2011  . Chronic anticoagulation 12/16/2010  . 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 Cerebrovascular accident   . DJD (degenerative joint disease)   . Hypertension 02/21/2009  . Sick sinus syndrome 02/21/2009  . Hyperlipidemia 11/26/2008  . SEIZURE DISORDER 10/31/2008    LABS    Component Value Date/Time   NA 132* 03/29/2013 2102   NA 135 03/18/2013 1652   NA 140 11/14/2012 1210   K 4.4 03/29/2013 2102   K 3.8 03/18/2013 1652   K 4.3 11/14/2012 1210   CL 96 03/29/2013 2102   CL 98 03/18/2013 1652   CL 104 11/14/2012 1210   CO2 24 03/29/2013 2102   CO2 26 03/18/2013 1652   CO2 27 11/14/2012 1210   GLUCOSE 148* 03/29/2013 2102  GLUCOSE 160* 03/18/2013 1652   GLUCOSE 93 11/14/2012 1210   BUN 34* 03/29/2013 2102   BUN 32* 03/18/2013 1652   BUN 17 11/14/2012 1210   CREATININE 1.66* 03/29/2013 2102   CREATININE 1.61* 03/18/2013 1652   CREATININE 1.16* 11/14/2012 1210   CREATININE 1.09 07/24/2012 1237   CREATININE 1.22* 06/22/2012 1225   CREATININE 1.00 12/07/2011 0944   CALCIUM 10.2 03/29/2013 2102   CALCIUM 10.9* 03/18/2013 1652   CALCIUM 9.8 11/14/2012 1210   GFRNONAA 27* 03/29/2013 2102   GFRNONAA 28* 03/18/2013 1652   GFRNONAA 51* 12/07/2011 0944   GFRAA 32* 03/29/2013 2102   GFRAA 33* 03/18/2013 1652   GFRAA 59* 12/07/2011 0944   CMP     Component Value Date/Time   NA 132* 03/29/2013 2102   K 4.4 03/29/2013 2102   CL 96 03/29/2013 2102   CO2 24 03/29/2013 2102   GLUCOSE 148* 03/29/2013 2102   BUN 34* 03/29/2013 2102   CREATININE 1.66* 03/29/2013 2102   CREATININE 1.16* 11/14/2012 1210   CALCIUM 10.2 03/29/2013 2102   PROT 6.8 11/14/2012 1210   ALBUMIN 3.8 11/14/2012 1210   AST 17  11/14/2012 1210   ALT 11 11/14/2012 1210   ALKPHOS 96 11/14/2012 1210   BILITOT 0.5 11/14/2012 1210   GFRNONAA 27* 03/29/2013 2102   GFRAA 32* 03/29/2013 2102       Component Value Date/Time   WBC 8.5 03/29/2013 2102   WBC 10.7* 03/18/2013 1652   WBC 5.6 11/14/2012 1210   HGB 15.1* 03/29/2013 2102   HGB 15.3* 03/18/2013 1652   HGB 13.9 11/14/2012 1210   HCT 45.1 03/29/2013 2102   HCT 45.9 03/18/2013 1652   HCT 41.0 11/14/2012 1210   MCV 90.9 03/29/2013 2102   MCV 90.7 03/18/2013 1652   MCV 88.0 11/14/2012 1210    Lipid Panel     Component Value Date/Time   CHOL 143 03/30/2010 2219   TRIG 85 03/30/2010 2219   HDL 76 03/30/2010 2219   CHOLHDL 1.9 Ratio 03/30/2010 2219   VLDL 17 03/30/2010 2219   LDLCALC 50 03/30/2010 2219    ABG    Component Value Date/Time   PHART 7.337* 10/05/2008 1450   PCO2ART 39.7 10/05/2008 1450   PO2ART 129.0* 10/05/2008 1450   HCO3 20.7 10/05/2008 1450   TCO2 19.3 10/05/2008 1450   ACIDBASEDEF 4.1* 10/05/2008 1450   O2SAT 98.7 10/05/2008 1450     Lab Results  Component Value Date   TSH 1.131 09/11/2009   BNP (last 3 results)  Recent Labs  03/18/13 1652 03/29/13 2111  PROBNP 1435.0* 1458.0*   Cardiac Panel (last 3 results) No results found for this basename: CKTOTAL, CKMB, TROPONINI, RELINDX,  in the last 72 hours  Iron/TIBC/Ferritin    Component Value Date/Time   IRON 67 11/06/2008 0610   TIBC 229* 11/06/2008 0610     EKG Orders placed in visit on 04/03/13  . EKG 12-LEAD     Prior Assessment and Plan Problem List as of 04/12/2013     Cardiovascular and Mediastinum   Hypertension   Last Assessment & Plan   04/03/2013 Office Visit Written 04/03/2013  3:37 PM by Lendon Colonel, NP     She has been having episodes of dizziness, with chest discomfort, with near syncope. Patient's blood pressure is normal. She is not orthostatic. I will check a BMET, and a pro BNP, as her proBNP was elevated in the emergency room. I will recheck an echocardiogram  and a  stress Myoview, using Lexiscan for recurrent chest pain and for LV function.    Sick sinus syndrome   Last Assessment & Plan   04/03/2013 Office Visit Written 04/03/2013  3:38 PM by Lendon Colonel, NP     She is followed by Dr. Rayann Heman and Dr. Crissie Sickles. During hospitalization in the ER, the patient did have her pacemaker interrogated and was found to functioning appropriately. A letter was sent to the patient from the [place] office pacemaker clinic stating so. Will not make any changes in her medication regimen at this time. EKG reveals atrial fibrillation with a rate of 77 beats per minute.    Cerebrovascular accident   Atrial fibrillation   Last Assessment & Plan   04/03/2013 Office Visit Written 04/03/2013  3:38 PM by Lendon Colonel, NP     Heart rate is well-controlled currently. She remains on warfarin. She is followed at our [place] office.      Endocrine   Fasting hyperglycemia     Musculoskeletal and Integument   DJD (degenerative joint disease)     Other   Hyperlipidemia   Last Assessment & Plan   09/09/2011 Office Visit Edited 09/09/2011  2:44 PM by Yehuda Savannah, MD     Lipid profile remains excellent with current therapy, which will be continued.    SEIZURE DISORDER   PPM-St.Jude   Last Assessment & Plan   08/17/2012 Office Visit Written 08/17/2012 10:02 AM by Evans Lance, MD     Her St. Jude dual-chamber pacemaker is working normally. She is now chronically in atrial fibrillation, we have reprogrammed her device to the VVIR pacing mode. We'll plan to recheck in several months.    Chronic anticoagulation   Last Assessment & Plan   11/14/2012 Office Visit Written 11/14/2012 11:36 AM by Yehuda Savannah, MD     Patient has done well on warfarin with no manifest or occult bleeding.  We will continue to monitor CBCs and FOBT.    Gout       Imaging: Nm Myocar Single W/spect W/wall Motion And Ef  04/10/2013   CLINICAL DATA:  79 year old  female with chest pain  EXAM: MYOCARDIAL IMAGING WITH SPECT (REST AND PHARMACOLOGIC-STRESS)  GATED LEFT VENTRICULAR WALL MOTION STUDY  LEFT VENTRICULAR EJECTION FRACTION  TECHNIQUE: Standard myocardial SPECT imaging was performed after resting intravenous injection of 10 mCi Tc-101m sestamibi. Subsequently, intravenous infusion of Lexiscan was performed under the supervision of the Cardiology staff. At peak effect of the drug, 30 mCi Tc-71m sestamibi was injected intravenously and standard myocardial SPECT imaging was performed. Quantitative gated imaging was also performed to evaluate left ventricular wall motion, and estimate left ventricular ejection fraction.  COMPARISON:  None.  FINDINGS: Baseline EKG shows atrial flutter with non-specific ST/T changes, occasional PVCs. Following lexiscan injection heart rate increased from 77 beats to 84 beats per minute, blood pressure increased from 144/86 mmHg to 154/77 mmHg. There were non-diagnostic ST/T changes after lexiscan injection, the patient was in atrial flutter throughout the test.  Raw images showed appropriate tracer uptake. There were no myocardial perfusion defects in rest or stress images. Gated imaging showed EDV 35 mL, ESV 7 mL, LVEF 79%, TID 0.93. There was normal wall motion and normal left ventricular systolic function.  IMPRESSION: 1.  Negative lexiscan MPI for ischemia  2. Normal left ventricular wall motion, normal left ventricular systolic function   Electronically Signed   By: Carlyle Dolly   On: 04/10/2013  12:32   Dg Chest Port 1 View  03/29/2013   CLINICAL DATA:  Chest pain and shortness of breath.  EXAM: PORTABLE CHEST - 1 VIEW  COMPARISON:  03/18/2013  FINDINGS: Stable appearance of cardiac pacemaker. Shallow inspiration. Borderline heart size is probably normal for technique. Pulmonary vascularity is normal. No focal airspace consolidation in the lungs. No blunting of costophrenic angles. No pneumothorax. Degenerative changes in the  shoulders.  IMPRESSION: No active disease.   Electronically Signed   By: Lucienne Capers M.D.   On: 03/29/2013 21:17   Dg Chest Portable 1 View  03/18/2013   CLINICAL DATA:  Loss of consciousness.  EXAM: PORTABLE CHEST - 1 VIEW  COMPARISON:  12/07/2011.  FINDINGS: Cardiomegaly. Intact dual lead pacemaker with leads in good position. Mild vascular congestion. No infiltrates or pneumothorax. Severe degenerative change both shoulders.  IMPRESSION: Cardiomegaly with mild vascular congestion. Worsening aeration from priors.   Electronically Signed   By: Rolla Flatten M.D.   On: 03/18/2013 17:31   Dg Abd Acute W/chest  03/29/2013   CLINICAL DATA:  Left-sided chest pain, shortness of breath, and weakness tonight.  EXAM: ACUTE ABDOMEN SERIES (ABDOMEN 2 VIEW & CHEST 1 VIEW)  COMPARISON:  Chest 03/29/2013  FINDINGS: Stable appearance of cardiac pacemaker. Normal heart size and pulmonary vascularity. No focal airspace consolidation in the lungs. No blunting of costophrenic angles. No pneumothorax. Calcification of the mitral valve annulus. Stable appearance since previous chest radiograph.  Normal bowel gas pattern with scattered gas and stool in the colon. No small or large bowel distention. No free intra-abdominal air. No abnormal air-fluid levels. No radiopaque stones. Calcifications of the pelvis consistent with calcified fibroids. Degenerative changes in the spine and hips.  IMPRESSION: No evidence of active pulmonary disease. Nonobstructive bowel gas pattern.   Electronically Signed   By: Lucienne Capers M.D.   On: 03/29/2013 22:05

## 2013-04-12 NOTE — Assessment & Plan Note (Signed)
She is to have her pacemaker checked in one year per Dr. Rayann Heman.

## 2013-04-12 NOTE — Progress Notes (Signed)
HPI: [name] an 79 year old former patient of Dr. Lattie Haw we are following for ongoing assessment and management of atrial fibrillation, hypertension, with history of sick sinus syndrome status post pacemaker placement. The patient was last seen in the office on 04/03/2013 complaints of dizziness and the Lasix was discontinued for couple days due to hyponatremia and dehydration with a creatinine of 1.36. Orthostatics were negative. I followed up with an echocardiogram in [facility] for LV function and evidence of ischemia. The patient is followed in our [place] [facility] for Coumadin dosing.   Echocardiogram revealed normal LV systolic function was indeterminate diastolic dysfunction. The patient had a severely calcified mitral valve annulus is moderate stenosis noted with a mean gradient of 5 mm mercury aortic valve was mildly calcified with a valve area 1.63. The patient stress Myoview which is completed  04/09/2013 was negative. She had normal LV systolic function and no evidence of ischemia.    She is doing well and is without complaints. She says that she was mixing up her diuretic with her gout medication. Now that she is taking medications as directed.   No Known Allergies  Current Outpatient Prescriptions  Medication Sig Dispense Refill  . allopurinol (ZYLOPRIM) 100 MG tablet Take 100 mg by mouth daily.        Marland Kitchen diltiazem (TIAZAC) 360 MG 24 hr capsule Take 360 mg by mouth daily.      . dorzolamide-timolol (COSOPT) 22.3-6.8 MG/ML ophthalmic solution Place 1 drop into the left eye at bedtime.       . furosemide (LASIX) 20 MG tablet Take 1 tablet (20 mg total) by mouth daily.  30 tablet  6  . lisinopril (PRINIVIL,ZESTRIL) 20 MG tablet Take 20 mg by mouth daily.      . meclizine (ANTIVERT) 25 MG tablet Take 25 mg by mouth 3 (three) times daily as needed. Dizziness      . pravastatin (PRAVACHOL) 80 MG tablet Take 80 mg by mouth at bedtime.      . ranitidine (ZANTAC) 150 MG tablet  Take 150 mg by mouth at bedtime.       Marland Kitchen warfarin (COUMADIN) 5 MG tablet Take 0.5 tablets (2.5 mg total) by mouth daily.  45 tablet  3  . [DISCONTINUED] colchicine 0.6 MG tablet Take 0.6 mg by mouth daily as needed. Gout Flare Ups      . [DISCONTINUED] diltiazem (CARDIZEM CD) 360 MG 24 hr capsule TAKE (1) CAPSULE BY MOUTH ONCE DAILY.  30 capsule  5  . [DISCONTINUED] simvastatin (ZOCOR) 40 MG tablet Take 40 mg by mouth at bedtime.         No current facility-administered medications for this visit.    Past Medical History  Diagnosis Date  . Tachycardia-bradycardia syndrome     with pauses and syncope;PPM implantation 10/2008,negative stress nuclear study 03/2005  . Atrial fibrillation     persistant  . Mitral valve disease     Severe mitral annular calcification; mild to moderate stenosis-not clearly rheumatic  . Hypertension     heart disease diastolic dysfunction ; 123456; mild pulmonary edema in 2006;responded to diurectics ; LVH  . Chronic renal insufficiency     baseline creatine 1.4; 1.04 in 03/2010  . Hyperlipidemia   . Angioedema 08/2006  . Anemia     H/H of 11.3/36.7 in 12/2008 ;normal MCV; normal CBC in 2011  . Gout   . 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 Vertigo   . Shortness of breath   . Chronic bronchitis   . Blood transfusion   . DJD (degenerative joint disease)     hands, knees  . Seizure disorder   . Nephrolithiasis   . Stroke   . Retinal hemorrhage     Past Surgical History  Procedure Laterality Date  . Insert / replace / remove pacemaker  11/2008    St.Jude DUAL chamber pacemaker 11/06/2008  . Total knee arthroplasty  04/2009    Right  . Cataract extraction w/phaco  03/29/2011    Procedure: CATARACT EXTRACTION PHACO AND INTRAOCULAR LENS PLACEMENT  (IOC);  Surgeon: Tonny Branch;  Location: [facility];  Service: Ophthalmology;  Laterality: Right;  CDE 12.62    ROS: Review of systems complete and found to be negative unless listed above  PHYSICAL EXAM BP 146/70  Pulse 86  Ht 5\' 2"  (1.575 m)  Wt 161 lb (73.029 kg)  BMI 29.44 kg/m2  General: Well developed, well nourished, in no acute distress Head: Eyes PERRLA, No xanthomas.   Normal cephalic and atramatic  Lungs: Clear bilaterally to auscultation and percussion. Heart: HRRR S1 S2, 1/6 systolic murmur..  Pulses are 2+ & equal.            No carotid bruit. No JVD.  No abdominal bruits. No femoral bruits. Abdomen: Bowel sounds are positive, abdomen soft and non-tender without masses or                  Hernia's noted. Msk:  Back normal, normal gait. Normal strength and tone for age. Extremities: No clubbing, cyanosis or edema.  DP +1 Neuro: Alert and oriented X 3. Psych:  Good affect, responds appropriately    ASSESSMENT AND PLAN

## 2013-04-12 NOTE — Assessment & Plan Note (Signed)
Blood pressure is well controlled currently. No changes in her medication regimen.

## 2013-04-12 NOTE — Patient Instructions (Signed)
Your physician recommends that you schedule a follow-up appointment in: 6 months You will receive a reminder letter two months in advance reminding you to call and schedule your appointment. If you don't receive this letter, please contact our office.  Your physician recommends that you continue on your current medications as directed. Please refer to the Current Medication list given to you today.     

## 2013-04-13 ENCOUNTER — Encounter: Payer: Self-pay | Admitting: *Deleted

## 2013-04-13 NOTE — H&P (Deleted)
NAMEJD, WALTZ             ACCOUNT NO.:  [id number]  MEDICAL RECORD NO.:  [id number]  LOCATION:  RAD                           FACILITY:  [facility]  PHYSICIAN:  Isauro Skelley G. Everette Rank, MD   DATE OF BIRTH:  1926-12-03  DATE OF ADMISSION:  04/09/2013 DATE OF DISCHARGE:  LH                             HISTORY & PHYSICAL   HISTORY OF PRESENT ILLNESS:  An 79 year old African American female, came to the emergency room with a chief complaint of shortness of breath.  She is at which had been present for some 24 hours prior to this admission.  The patient was seen and evaluated by ED physician. She has been on dialysis and has end-stage renal disease.  X-rays on admission showed evidence of cardiomegaly with mild interstitial edema compatible with CHF, and right airspace process concerning for pneumonia.  It was thought that she did have evidence of congestive heart failure and evidence of early pneumonia along with end-stage renal disease.  She was subsequently admitted.  SOCIAL HISTORY:  The patient smokes cigarettes.  Does not use alcohol, and never did use alcohol.  PAST MEDICAL HISTORY:  End-stage renal disease, currently on dialysis, peritonitis, gastroesophageal reflux disease, anemia, hyperlipidemia, hypertension, arthritis, neuropathy, diabetes mellitus type 2, dysrhythmia, bradycardia.  PAST SURGICAL HISTORY:  The patient had thyroid surgery, cholecystectomy, knee surgery on right, abdominal hysterectomy, insertion of dialysis catheter, replacement on right AV fistula left for dialysis, previous colonoscopy and esophagogastroduodenoscopy.  FAMILY HISTORY:  Stomach cancer in mother.  Lung cancer in father. Colon cancer negative history.  ALLERGIES:  Penicillin and tape.  REVIEW OF SYSTEMS:  HEENT:  Negative.  CARDIOPULMONARY:  The patient has had increased dyspnea, slight cough.  GI:  No bowel irregularity or bleeding.  GU:  No dysuria or hematuria.  HOME MEDICATIONS:   Aspirin 81 mg daily, PhosLo 667 mg 2 tablets 3 times a day, calcium carbonate 600/400, 1 tab daily.  Lotrisone cream apply b.i.d., NovoLog 2-10 units 3 times daily by sliding scale, Lantus insulin 4 units b.i.d., lactulose 20 g daily as needed, Synthroid 50 mcg daily, Prinivil 20 mg daily, lovastatin 40 mg daily, omeprazole 20 mg daily, Bactroban cream b.i.d. as needed.  PHYSICAL EXAMINATION:  GENERAL:  On admission, the patient is alert and oriented. VITAL SIGNS:  Blood pressure 145/68, respirations 18, pulse 97, temp 99.1. HEENT:  Eyes, PERRLA.  TM negative.  Oropharynx benign. NECK:  Supple.  No JVD or thyroid abnormalities. HEART:  Regular rhythm.  No murmurs. LUNGS:  Clear to P and A. HEART:  Regular rhythm.  No murmurs. ABDOMEN:  No palpable organs or masses.  The. SKIN:  Warm and dry. NEUROLOGIC:  The patient is patient alert and oriented.  No motor or sensory abnormalities.  Cranial nerves intact.  ASSESSMENT:  The patient has chronic end-stage renal failure.  She was admitted with shortness of breath thought to be either evidence of interstitial edema and/or superimposed pneumonia.  PLAN:  Continue Maxipime and vancomycin.  Continue sliding scale and long-acting insulin.  Continue to monitor blood sugars.  We will obtain a Nephrology consult for urgent hemodialysis and consult with Pulmonology.     Marchel Foote G. Everette Rank, MD  AGM/MEDQ  D:  04/12/2013  T:  04/13/2013  Job:  [id number]

## 2013-04-13 NOTE — H&P (Signed)
[name], [name]             ACCOUNT NO.:  [id number]  MEDICAL RECORD NO.:  [id number]  LOCATION:  RAD                           FACILITY:  [facility]  PHYSICIAN:  Hina Gupta G. Everette Rank, MD   DATE OF BIRTH:  01-05-1927  DATE OF ADMISSION:  04/09/2013 DATE OF DISCHARGE:  11/03/2014LH                             HISTORY & PHYSICAL   HISTORY OF PRESENT ILLNESS:  An 79 year old African American female, came to the emergency room with a chief complaint of shortness of breath.  She is at which had been present for some 24 hours prior to this admission.  The patient was seen and evaluated by ED physician. She has been on dialysis and has end-stage renal disease.  X-rays on admission showed evidence of cardiomegaly with mild interstitial edema compatible with CHF, and right airspace process concerning for pneumonia.  It was thought that she did have evidence of congestive heart failure and evidence of early pneumonia along with end-stage renal disease.  She was subsequently admitted.  SOCIAL HISTORY:  The patient smokes cigarettes.  Does not use alcohol, and never did use alcohol.  PAST MEDICAL HISTORY:  End-stage renal disease, currently on dialysis, peritonitis, gastroesophageal reflux disease, anemia, hyperlipidemia, hypertension, arthritis, neuropathy, diabetes mellitus type 2, dysrhythmia, bradycardia.  PAST SURGICAL HISTORY:  The patient had thyroid surgery, cholecystectomy, knee surgery on right, abdominal hysterectomy, insertion of dialysis catheter, replacement on right AV fistula left for dialysis, previous colonoscopy and esophagogastroduodenoscopy.  FAMILY HISTORY:  Stomach cancer in mother.  Lung cancer in father. Colon cancer negative history.  ALLERGIES:  Penicillin and tape.  REVIEW OF SYSTEMS:  HEENT:  Negative.  CARDIOPULMONARY:  The patient has had increased dyspnea, slight cough.  GI:  No bowel irregularity or bleeding.  GU:  No dysuria or hematuria.  HOME  MEDICATIONS:  Aspirin 81 mg daily, PhosLo 667 mg 2 tablets 3 times a day, calcium carbonate 600/400, 1 tab daily.  Lotrisone cream apply b.i.d., NovoLog 2-10 units 3 times daily by sliding scale, Lantus insulin 4 units b.i.d., lactulose 20 g daily as needed, Synthroid 50 mcg daily, Prinivil 20 mg daily, lovastatin 40 mg daily, omeprazole 20 mg daily, Bactroban cream b.i.d. as needed.  PHYSICAL EXAMINATION:  GENERAL:  On admission, the patient is alert and oriented. VITAL SIGNS:  Blood pressure 145/68, respirations 18, pulse 97, temp 99.1. HEENT:  Eyes, PERRLA.  TM negative.  Oropharynx benign. NECK:  Supple.  No JVD or thyroid abnormalities. HEART:  Regular rhythm.  No murmurs. LUNGS:  Clear to P and A. HEART:  Regular rhythm.  No murmurs. ABDOMEN:  No palpable organs or masses.  The. SKIN:  Warm and dry. NEUROLOGIC:  The patient is patient alert and oriented.  No motor or sensory abnormalities.  Cranial nerves intact.  ASSESSMENT:  The patient has chronic end-stage renal failure.  She was admitted with shortness of breath thought to be either evidence of interstitial edema and/or superimposed pneumonia.  PLAN:  Continue Maxipime and vancomycin.  Continue sliding scale and long-acting insulin.  Continue to monitor blood sugars.  We will obtain a Nephrology consult for urgent hemodialysis and consult with Pulmonology.     Aolani Piggott G. Everette Rank, MD  AGM/MEDQ  D:  04/12/2013  T:  04/13/2013  Job:  [id number]

## 2013-05-04 ENCOUNTER — Other Ambulatory Visit: Payer: Self-pay | Admitting: Cardiology

## 2013-05-21 ENCOUNTER — Ambulatory Visit (INDEPENDENT_AMBULATORY_CARE_PROVIDER_SITE_OTHER): Payer: Medicare Other | Admitting: *Deleted

## 2013-05-21 ENCOUNTER — Encounter: Payer: Self-pay | Admitting: Internal Medicine

## 2013-05-21 DIAGNOSIS — I4891 Unspecified atrial fibrillation: Secondary | ICD-10-CM

## 2013-05-21 LAB — MDC_IDC_ENUM_SESS_TYPE_REMOTE
Battery Voltage: 2.95 V
Brady Statistic RV Percent Paced: 48 %
Lead Channel Impedance Value: 460 Ohm
Lead Channel Pacing Threshold Pulse Width: 0.4 ms
Lead Channel Setting Pacing Pulse Width: 0.4 ms
Lead Channel Setting Sensing Sensitivity: 2 mV

## 2013-05-24 ENCOUNTER — Ambulatory Visit (INDEPENDENT_AMBULATORY_CARE_PROVIDER_SITE_OTHER): Payer: Medicare Other | Admitting: *Deleted

## 2013-05-24 DIAGNOSIS — I495 Sick sinus syndrome: Secondary | ICD-10-CM

## 2013-05-24 DIAGNOSIS — I4891 Unspecified atrial fibrillation: Secondary | ICD-10-CM

## 2013-05-24 DIAGNOSIS — I639 Cerebral infarction, unspecified: Secondary | ICD-10-CM

## 2013-05-24 DIAGNOSIS — Z7901 Long term (current) use of anticoagulants: Secondary | ICD-10-CM

## 2013-05-24 DIAGNOSIS — I6350 Cerebral infarction due to unspecified occlusion or stenosis of unspecified cerebral artery: Secondary | ICD-10-CM

## 2013-05-24 LAB — POCT INR: INR: 1.9

## 2013-06-04 ENCOUNTER — Encounter: Payer: Self-pay | Admitting: *Deleted

## 2013-06-08 ENCOUNTER — Other Ambulatory Visit: Payer: Self-pay

## 2013-06-08 ENCOUNTER — Telehealth: Payer: Self-pay | Admitting: *Deleted

## 2013-06-08 MED ORDER — DILTIAZEM HCL ER BEADS 360 MG PO CP24: 360.0000 mg | ORAL_CAPSULE | Freq: Every day | ORAL | Status: DC

## 2013-06-08 NOTE — Telephone Encounter (Signed)
Pt is out of diltiazem and needs called in to [facility]

## 2013-06-28 ENCOUNTER — Ambulatory Visit (INDEPENDENT_AMBULATORY_CARE_PROVIDER_SITE_OTHER): Payer: Medicare Other | Admitting: *Deleted

## 2013-06-28 DIAGNOSIS — I6350 Cerebral infarction due to unspecified occlusion or stenosis of unspecified cerebral artery: Secondary | ICD-10-CM

## 2013-06-28 DIAGNOSIS — I495 Sick sinus syndrome: Secondary | ICD-10-CM

## 2013-06-28 DIAGNOSIS — Z5181 Encounter for therapeutic drug level monitoring: Secondary | ICD-10-CM | POA: Insufficient documentation

## 2013-06-28 DIAGNOSIS — I639 Cerebral infarction, unspecified: Secondary | ICD-10-CM

## 2013-06-28 DIAGNOSIS — I4891 Unspecified atrial fibrillation: Secondary | ICD-10-CM

## 2013-06-28 DIAGNOSIS — Z7901 Long term (current) use of anticoagulants: Secondary | ICD-10-CM

## 2013-06-28 LAB — POCT INR: INR: 2.2

## 2013-08-09 ENCOUNTER — Encounter (INDEPENDENT_AMBULATORY_CARE_PROVIDER_SITE_OTHER): Payer: Medicare Other

## 2013-08-09 DIAGNOSIS — I4891 Unspecified atrial fibrillation: Secondary | ICD-10-CM

## 2013-08-09 DIAGNOSIS — Z7901 Long term (current) use of anticoagulants: Secondary | ICD-10-CM

## 2013-08-09 DIAGNOSIS — I495 Sick sinus syndrome: Secondary | ICD-10-CM

## 2013-08-09 DIAGNOSIS — I6350 Cerebral infarction due to unspecified occlusion or stenosis of unspecified cerebral artery: Secondary | ICD-10-CM

## 2013-08-13 ENCOUNTER — Ambulatory Visit (INDEPENDENT_AMBULATORY_CARE_PROVIDER_SITE_OTHER): Payer: Medicare Other | Admitting: *Deleted

## 2013-08-13 DIAGNOSIS — I495 Sick sinus syndrome: Secondary | ICD-10-CM

## 2013-08-13 DIAGNOSIS — Z5181 Encounter for therapeutic drug level monitoring: Secondary | ICD-10-CM

## 2013-08-13 DIAGNOSIS — I639 Cerebral infarction, unspecified: Secondary | ICD-10-CM

## 2013-08-13 DIAGNOSIS — I6350 Cerebral infarction due to unspecified occlusion or stenosis of unspecified cerebral artery: Secondary | ICD-10-CM

## 2013-08-13 LAB — POCT INR: INR: 2.2

## 2013-08-19 ENCOUNTER — Encounter (HOSPITAL_COMMUNITY): Payer: Self-pay | Admitting: Emergency Medicine

## 2013-08-19 ENCOUNTER — Emergency Department (HOSPITAL_COMMUNITY): Payer: Medicare Other

## 2013-08-19 ENCOUNTER — Observation Stay (HOSPITAL_COMMUNITY)
Admission: EM | Admit: 2013-08-19 | Discharge: 2013-08-21 | Disposition: A | Discharge status: Home / Self Care | Payer: Medicare Other | Attending: Internal Medicine | Admitting: Internal Medicine

## 2013-08-19 DIAGNOSIS — M109 Gout, unspecified: Secondary | ICD-10-CM | POA: Diagnosis present

## 2013-08-19 DIAGNOSIS — E785 Hyperlipidemia, unspecified: Secondary | ICD-10-CM

## 2013-08-19 DIAGNOSIS — M1990 Unspecified osteoarthritis, unspecified site: Secondary | ICD-10-CM

## 2013-08-19 DIAGNOSIS — N189 Chronic kidney disease, unspecified: Secondary | ICD-10-CM | POA: Insufficient documentation

## 2013-08-19 DIAGNOSIS — R42 Dizziness and giddiness: Secondary | ICD-10-CM | POA: Insufficient documentation

## 2013-08-19 DIAGNOSIS — Z5181 Encounter for therapeutic drug level monitoring: Secondary | ICD-10-CM

## 2013-08-19 DIAGNOSIS — H3560 Retinal hemorrhage, unspecified eye: Secondary | ICD-10-CM | POA: Insufficient documentation

## 2013-08-19 DIAGNOSIS — I059 Rheumatic mitral valve disease, unspecified: Secondary | ICD-10-CM | POA: Insufficient documentation

## 2013-08-19 DIAGNOSIS — R569 Unspecified convulsions: Secondary | ICD-10-CM

## 2013-08-19 DIAGNOSIS — G459 Transient cerebral ischemic attack, unspecified: Principal | ICD-10-CM | POA: Diagnosis present

## 2013-08-19 DIAGNOSIS — I495 Sick sinus syndrome: Secondary | ICD-10-CM

## 2013-08-19 DIAGNOSIS — Z7901 Long term (current) use of anticoagulants: Secondary | ICD-10-CM

## 2013-08-19 DIAGNOSIS — R7301 Impaired fasting glucose: Secondary | ICD-10-CM

## 2013-08-19 DIAGNOSIS — I10 Essential (primary) hypertension: Secondary | ICD-10-CM | POA: Diagnosis present

## 2013-08-19 DIAGNOSIS — Z950 Presence of cardiac pacemaker: Secondary | ICD-10-CM | POA: Diagnosis present

## 2013-08-19 DIAGNOSIS — G40909 Epilepsy, unspecified, not intractable, without status epilepticus: Secondary | ICD-10-CM | POA: Insufficient documentation

## 2013-08-19 DIAGNOSIS — I4891 Unspecified atrial fibrillation: Secondary | ICD-10-CM | POA: Diagnosis present

## 2013-08-19 DIAGNOSIS — I639 Cerebral infarction, unspecified: Secondary | ICD-10-CM

## 2013-08-19 LAB — DIFFERENTIAL
BASOS ABS: 0 10*3/uL (ref 0.0–0.1)
Basophils Relative: 0 % (ref 0–1)
EOS PCT: 1 % (ref 0–5)
Eosinophils Absolute: 0.1 10*3/uL (ref 0.0–0.7)
LYMPHS ABS: 1.5 10*3/uL (ref 0.7–4.0)
LYMPHS PCT: 19 % (ref 12–46)
Monocytes Absolute: 0.6 10*3/uL (ref 0.1–1.0)
Monocytes Relative: 7 % (ref 3–12)
Neutro Abs: 6.1 10*3/uL (ref 1.7–7.7)
Neutrophils Relative %: 74 % (ref 43–77)

## 2013-08-19 LAB — PROTIME-INR
INR: 1.68 — AB (ref 0.00–1.49)
PROTHROMBIN TIME: 19.3 s — AB (ref 11.6–15.2)

## 2013-08-19 LAB — CBC
HCT: 43.1 % (ref 36.0–46.0)
Hemoglobin: 14.1 g/dL (ref 12.0–15.0)
MCH: 30.1 pg (ref 26.0–34.0)
MCHC: 32.7 g/dL (ref 30.0–36.0)
MCV: 92.1 fL (ref 78.0–100.0)
PLATELETS: 196 10*3/uL (ref 150–400)
RBC: 4.68 MIL/uL (ref 3.87–5.11)
RDW: 13.8 % (ref 11.5–15.5)
WBC: 8.3 10*3/uL (ref 4.0–10.5)

## 2013-08-19 LAB — APTT: aPTT: 30 seconds (ref 24–37)

## 2013-08-19 NOTE — ED Notes (Signed)
Pt c/o left leg numbness since around 8pm.

## 2013-08-19 NOTE — ED Provider Notes (Signed)
CSN: [id number]     Arrival date & time 08/19/13  2205 History  This chart was scribed for Wynetta Fines, MD by Jenne Campus, ED Scribe. This patient was seen in room APA02/APA02 and the patient's care was started at 11:07 PM.   Chief Complaint  Patient presents with  . Leg Numbness      The history is provided by the patient. No language interpreter was used.    HPI Comments: [name] is a 80 y.o. female who presents to the Emergency Department complaining of sudden onset left leg paresthesias with associated weakness that started around 2 to 3 hours ago. Pt states that she was in her rocking chair asleep when she went to get up and felt off balance with the left leg feeling tired. Pt states that she tried to walk it off and stood washing dishes with no improvement. Her leg then became numb described as "feeling asleep" from the knee down. The numbness then started to travel into her upper thigh. Currently her leg still feels weak and numb. She has a h/o CVA with chronic left arm weakness. She denies any changes. Pt is currently on coumadin.   Past Medical History  Diagnosis Date  . Tachycardia-bradycardia syndrome     with pauses and syncope;PPM implantation 10/2008,negative stress nuclear study 03/2005  . Atrial fibrillation     persistant  . Mitral valve disease     Severe mitral annular calcification; mild to moderate stenosis-not clearly rheumatic  . Hypertension     heart disease diastolic dysfunction ; 123456; mild pulmonary edema in 2006;responded to diurectics ; LVH  . Chronic renal insufficiency     baseline creatine 1.4; 1.04 in 03/2010  . Hyperlipidemia   . Angioedema 08/2006  . Anemia     H/H of 11.3/36.7 in 12/2008 ;normal MCV; normal CBC in 2011  . Gout   . 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 Vertigo   . Shortness of breath   . Chronic bronchitis   . Blood transfusion   . DJD (degenerative joint disease)     hands, knees  . Seizure disorder   . Nephrolithiasis   . Stroke   . Retinal hemorrhage    Past Surgical History  Procedure Laterality Date  . Insert / replace / remove pacemaker  11/2008    St.Jude DUAL chamber pacemaker 11/06/2008  . Total knee arthroplasty  04/2009    Right  . Cataract extraction w/phaco  03/29/2011    Procedure: CATARACT EXTRACTION PHACO AND INTRAOCULAR LENS PLACEMENT (IOC);  Surgeon: Tonny Branch;  Location: [facility];  Service: Ophthalmology;  Laterality: Right;  CDE 12.62   Family History  Problem Relation Age of Onset  . Hypotension Neg Hx   . Anesthesia problems Neg Hx   . Pseudochol deficiency Neg Hx   . Malignant hyperthermia Neg Hx   . Cancer Other     unknown cancer   History  Substance Use Topics  . Smoking status: Never Smoker   . Smokeless tobacco: Never Used  . Alcohol Use: No   No OB history provided.  Review of Systems  A complete 10 system review of systems was obtained and all systems are negative except as noted in the HPI and PMH.    Allergies  Review of  patient's allergies indicates no known allergies.  Home Medications   Current Outpatient Rx  Name  Route  Sig  Dispense  Refill  . albuterol (PROVENTIL HFA;VENTOLIN HFA) 108 (90 BASE) MCG/ACT inhaler   Inhalation   Inhale 2 puffs into the lungs every 6 (six) hours as needed for wheezing or shortness of breath.         . allopurinol (ZYLOPRIM) 100 MG tablet   Oral   Take 100 mg by mouth daily.           Marland Kitchen diltiazem (TIAZAC) 360 MG 24 hr capsule   Oral   Take 1 capsule (360 mg total) by mouth daily.   30 capsule   6   . dorzolamide-timolol (COSOPT) 22.3-6.8 MG/ML ophthalmic solution   Left Eye   Place 1 drop into the left eye at bedtime.          . furosemide (LASIX) 40 MG tablet   Oral   Take 40 mg by mouth  daily.         Marland Kitchen lisinopril (PRINIVIL,ZESTRIL) 20 MG tablet   Oral   Take 20 mg by mouth daily.         . meclizine (ANTIVERT) 25 MG tablet   Oral   Take 25 mg by mouth 3 (three) times daily as needed. Dizziness         . pravastatin (PRAVACHOL) 80 MG tablet   Oral   Take 80 mg by mouth at bedtime.         . ranitidine (ZANTAC) 150 MG tablet   Oral   Take 150 mg by mouth at bedtime.          Marland Kitchen warfarin (COUMADIN) 5 MG tablet   Oral   Take 2.5 mg by mouth daily.          Triage Vitals: BP 172/58  Pulse 70  Temp(Src) 97.7 F (36.5 C) (Oral)  Resp 16  Ht 5\' 1"  (1.549 m)  Wt 161 lb (73.029 kg)  BMI 30.44 kg/m2  SpO2 99%  Physical Exam  Nursing note and vitals reviewed.  General: Well-developed, well-nourished female in no acute distress; appearance consistent with age of record HENT: normocephalic; atraumatic Eyes: decreased vision in the left eye with corneal opacity and left lateral strabismus, right pupil is reactive and EOM appear intact on the right Neck: supple Heart: regular rate, irregular rhythm consistent with pt's h/o A. Fib ; no murmurs, rubs or gallops Lungs: clear to auscultation bilaterally Abdomen: soft; nondistended; nontender; no masses or hepatosplenomegaly; bowel sounds present Extremities: No deformity; full range of motion; pulses normal Neurologic: Awake, alert and oriented; no ataxia on finger to nose; no facial droop, strength of 3+ out of 5 in the LLE, +5 out of 5 in other 3 extremities; altered sensation left lower extremity Skin: Warm and dry Psychiatric: Normal mood and affect   ED Course  Procedures (including critical care time)  DIAGNOSTIC STUDIES: Oxygen Saturation is 99% on RA, normal by my interpretation.    COORDINATION OF CARE: 11:13 PM-Code Stroke called.   Pt will be sent to [facility] with [facility] to see. Dr. Karle Starch, accepting EDP, is aware.  MDM  Nursing notes and vitals signs,  including pulse oximetry, reviewed.  Summary of this visit's results, reviewed by myself:  Labs:  Results for orders placed during the hospital encounter of 08/19/13 (from the past 24 hour(s))  PROTIME-INR     Status: Abnormal   Collection  Time    08/19/13 11:35 PM      Result Value Ref Range   Prothrombin Time 19.3 (*) 11.6 - 15.2 seconds   INR 1.68 (*) 0.00 - 1.49  APTT     Status: None   Collection Time    08/19/13 11:35 PM      Result Value Ref Range   aPTT 30  24 - 37 seconds  CBC     Status: None   Collection Time    08/19/13 11:35 PM      Result Value Ref Range   WBC 8.3  4.0 - 10.5 K/uL   RBC 4.68  3.87 - 5.11 MIL/uL   Hemoglobin 14.1  12.0 - 15.0 g/dL   HCT 43.1  36.0 - 46.0 %   MCV 92.1  78.0 - 100.0 fL   MCH 30.1  26.0 - 34.0 pg   MCHC 32.7  30.0 - 36.0 g/dL   RDW 13.8  11.5 - 15.5 %   Platelets 196  150 - 400 K/uL  DIFFERENTIAL     Status: None   Collection Time    08/19/13 11:35 PM      Result Value Ref Range   Neutrophils Relative % 74  43 - 77 %   Neutro Abs 6.1  1.7 - 7.7 K/uL   Lymphocytes Relative 19  12 - 46 %   Lymphs Abs 1.5  0.7 - 4.0 K/uL   Monocytes Relative 7  3 - 12 %   Monocytes Absolute 0.6  0.1 - 1.0 K/uL   Eosinophils Relative 1  0 - 5 %   Eosinophils Absolute 0.1  0.0 - 0.7 K/uL   Basophils Relative 0  0 - 1 %   Basophils Absolute 0.0  0.0 - 0.1 K/uL    Imaging Studies: Dg Chest 1 View  08/19/2013   CLINICAL DATA:  Left-sided weakness.  EXAM: CHEST - 1 VIEW  COMPARISON:  Chest x-ray 03/29/2013.  FINDINGS: Stable cardiomegaly. Cardiac pacer noted with lead tips over the right atrium and right ventricle. No pulmonary venous distention. No pleural effusion. Pleural parenchymal scarring right lung base. This finding is stable. No pleural effusion or pneumothorax. No acute osseous abnormality.  IMPRESSION: 1. Stable cardiomegaly, no CHF.  Cardiac pacer noted. 2. Stable pleural parenchymal thickening right lung base . No acute cardiopulmonary  disease.   Electronically Signed   By: Marcello Moores  Register   On: 08/19/2013 23:36   Ct Head Wo Contrast  08/19/2013   CLINICAL DATA:  Stroke.  EXAM: CT HEAD WITHOUT CONTRAST  TECHNIQUE: Contiguous axial images were obtained from the base of the skull through the vertex without intravenous contrast.  COMPARISON:  CT HEAD W/O CM dated 11/03/2008  FINDINGS: No mass. No hydrocephalus. No hemorrhage. Subcortical and deep white matter changes consistent chronic ischemia. Old basal ganglia lacunar infarcts. Orbits are unremarkable. Mucosal thickening right maxillary sinus consistent with sinusitis. No acute bony abnormality.  IMPRESSION: Stable white matter changes consistent with chronic ischemia. Old lacunar infarctions. No acute abnormality. These results were called by telephone at the time of interpretation on 08/19/2013 at 11:26 PM to Dr. Shanon Rosser , who verbally acknowledged these results.   Electronically Signed   By: Marcello Moores  Register   On: 08/19/2013 23:27     I personally performed the services described in this documentation, which was scribed in my presence.  The recorded information has been reviewed and is accurate.     Wynetta Fines, MD 08/20/13 0005

## 2013-08-19 NOTE — ED Notes (Signed)
Pt reports that she was sleeping in her chair and when she woke up and got up to go eat, noticed her left leg was weak and began feeling a little numb.  Denies any additional problems or concerns.  Family denies noticing any change, other than leg weakness.

## 2013-08-20 ENCOUNTER — Observation Stay (HOSPITAL_COMMUNITY): Payer: Medicare Other

## 2013-08-20 ENCOUNTER — Encounter (HOSPITAL_COMMUNITY): Payer: Self-pay | Admitting: *Deleted

## 2013-08-20 DIAGNOSIS — M109 Gout, unspecified: Secondary | ICD-10-CM

## 2013-08-20 DIAGNOSIS — G459 Transient cerebral ischemic attack, unspecified: Secondary | ICD-10-CM

## 2013-08-20 DIAGNOSIS — I10 Essential (primary) hypertension: Secondary | ICD-10-CM

## 2013-08-20 DIAGNOSIS — I4891 Unspecified atrial fibrillation: Secondary | ICD-10-CM

## 2013-08-20 DIAGNOSIS — Z7901 Long term (current) use of anticoagulants: Secondary | ICD-10-CM

## 2013-08-20 DIAGNOSIS — I059 Rheumatic mitral valve disease, unspecified: Secondary | ICD-10-CM

## 2013-08-20 DIAGNOSIS — Z950 Presence of cardiac pacemaker: Secondary | ICD-10-CM

## 2013-08-20 DIAGNOSIS — I6350 Cerebral infarction due to unspecified occlusion or stenosis of unspecified cerebral artery: Secondary | ICD-10-CM

## 2013-08-20 LAB — URINALYSIS, ROUTINE W REFLEX MICROSCOPIC
Bilirubin Urine: NEGATIVE
Glucose, UA: NEGATIVE mg/dL
Ketones, ur: NEGATIVE mg/dL
LEUKOCYTES UA: NEGATIVE
NITRITE: NEGATIVE
PROTEIN: NEGATIVE mg/dL
Specific Gravity, Urine: >1.03 — ABNORMAL HIGH (ref 1.005–1.030)
Urobilinogen, UA: 0.2 mg/dL (ref 0.0–1.0)
pH: 5.5 (ref 5.0–8.0)

## 2013-08-20 LAB — COMPREHENSIVE METABOLIC PANEL
ALK PHOS: 80 U/L (ref 39–117)
ALK PHOS: 101 U/L (ref 39–117)
ALT: 10 U/L (ref 0–35)
ALT: 11 U/L (ref 0–35)
AST: 19 U/L (ref 0–37)
AST: 19 U/L (ref 0–37)
Albumin: 3.4 g/dL — ABNORMAL LOW (ref 3.5–5.2)
Albumin: 4 g/dL (ref 3.5–5.2)
BILIRUBIN TOTAL: 0.3 mg/dL (ref 0.3–1.2)
BUN: 14 mg/dL (ref 6–23)
BUN: 17 mg/dL (ref 6–23)
CALCIUM: 10.4 mg/dL (ref 8.4–10.5)
CHLORIDE: 107 meq/L (ref 96–112)
CO2: 22 meq/L (ref 19–32)
CO2: 24 meq/L (ref 19–32)
CREATININE: 0.94 mg/dL (ref 0.50–1.10)
Calcium: 10 mg/dL (ref 8.4–10.5)
Chloride: 103 mEq/L (ref 96–112)
Creatinine, Ser: 1.07 mg/dL (ref 0.50–1.10)
GFR calc Af Amer: 54 mL/min — ABNORMAL LOW (ref >90)
GFR calc Af Amer: 63 mL/min — ABNORMAL LOW (ref >90)
GFR calc non Af Amer: 54 mL/min — ABNORMAL LOW (ref >90)
GFR, EST NON AFRICAN AMERICAN: 46 mL/min — AB (ref >90)
Glucose, Bld: 105 mg/dL — ABNORMAL HIGH (ref 70–99)
Glucose, Bld: 146 mg/dL — ABNORMAL HIGH (ref 70–99)
POTASSIUM: 4.1 meq/L (ref 3.7–5.3)
Potassium: 4.6 mEq/L (ref 3.7–5.3)
SODIUM: 141 meq/L (ref 137–147)
Sodium: 142 mEq/L (ref 137–147)
Total Bilirubin: 0.3 mg/dL (ref 0.3–1.2)
Total Protein: 6.6 g/dL (ref 6.0–8.3)
Total Protein: 7.9 g/dL (ref 6.0–8.3)

## 2013-08-20 LAB — CBC WITH DIFFERENTIAL/PLATELET
Basophils Absolute: 0 10*3/uL (ref 0.0–0.1)
Basophils Relative: 0 % (ref 0–1)
Eosinophils Absolute: 0.1 10*3/uL (ref 0.0–0.7)
Eosinophils Relative: 1 % (ref 0–5)
HEMATOCRIT: 39 % (ref 36.0–46.0)
HEMOGLOBIN: 13 g/dL (ref 12.0–15.0)
LYMPHS PCT: 30 % (ref 12–46)
Lymphs Abs: 2.3 10*3/uL (ref 0.7–4.0)
MCH: 30.6 pg (ref 26.0–34.0)
MCHC: 33.3 g/dL (ref 30.0–36.0)
MCV: 91.8 fL (ref 78.0–100.0)
MONO ABS: 0.9 10*3/uL (ref 0.1–1.0)
MONOS PCT: 11 % (ref 3–12)
NEUTROS ABS: 4.4 10*3/uL (ref 1.7–7.7)
Neutrophils Relative %: 58 % (ref 43–77)
Platelets: 168 10*3/uL (ref 150–400)
RBC: 4.25 MIL/uL (ref 3.87–5.11)
RDW: 13.7 % (ref 11.5–15.5)
WBC: 7.7 10*3/uL (ref 4.0–10.5)

## 2013-08-20 LAB — RAPID URINE DRUG SCREEN, HOSP PERFORMED
AMPHETAMINES: NOT DETECTED
BARBITURATES: NOT DETECTED
Benzodiazepines: NOT DETECTED
Cocaine: NOT DETECTED
Opiates: NOT DETECTED
Tetrahydrocannabinol: NOT DETECTED

## 2013-08-20 LAB — HEMOGLOBIN A1C
Hgb A1c MFr Bld: 6.4 % — ABNORMAL HIGH (ref <5.7)
Mean Plasma Glucose: 137 mg/dL — ABNORMAL HIGH (ref <117)

## 2013-08-20 LAB — GLUCOSE, CAPILLARY
GLUCOSE-CAPILLARY: 92 mg/dL (ref 70–99)
GLUCOSE-CAPILLARY: 91 mg/dL (ref 70–99)
Glucose-Capillary: 100 mg/dL — ABNORMAL HIGH (ref 70–99)
Glucose-Capillary: 84 mg/dL (ref 70–99)

## 2013-08-20 LAB — URINE MICROSCOPIC-ADD ON

## 2013-08-20 LAB — LIPID PANEL
CHOLESTEROL: 180 mg/dL (ref 0–200)
HDL: 57 mg/dL (ref >39)
LDL Cholesterol: 100 mg/dL — ABNORMAL HIGH (ref 0–99)
TRIGLYCERIDES: 116 mg/dL (ref <150)
Total CHOL/HDL Ratio: 3.2 RATIO
VLDL: 23 mg/dL (ref 0–40)

## 2013-08-20 LAB — ETHANOL: Alcohol, Ethyl (B): <11 mg/dL (ref 0–11)

## 2013-08-20 MED ORDER — IOHEXOL 350 MG/ML SOLN
50.0000 mL | Freq: Once | INTRAVENOUS | Status: AC | PRN
  Administered 2013-08-20: 50 mL via INTRAVENOUS

## 2013-08-20 MED ORDER — WARFARIN - PHARMACIST DOSING INPATIENT: Freq: Every day | Status: DC

## 2013-08-20 MED ORDER — SIMVASTATIN 40 MG PO TABS
40.0000 mg | ORAL_TABLET | Freq: Every day | ORAL | Status: DC
  Administered 2013-08-20: 40 mg via ORAL
  Filled 2013-08-20 (×2): qty 1

## 2013-08-20 MED ORDER — STROKE: EARLY STAGES OF RECOVERY BOOK
Freq: Once | Status: AC
  Administered 2013-08-20: 10:00:00
  Filled 2013-08-20: qty 1

## 2013-08-20 MED ORDER — WARFARIN SODIUM 5 MG PO TABS
5.0000 mg | ORAL_TABLET | Freq: Once | ORAL | Status: AC
  Administered 2013-08-20: 5 mg via ORAL
  Filled 2013-08-20: qty 1

## 2013-08-20 MED ORDER — DORZOLAMIDE HCL-TIMOLOL MAL 2-0.5 % OP SOLN
1.0000 [drp] | Freq: Every day | OPHTHALMIC | Status: DC
  Administered 2013-08-20: 1 [drp] via OPHTHALMIC
  Filled 2013-08-20: qty 10

## 2013-08-20 MED ORDER — ALBUTEROL SULFATE (2.5 MG/3ML) 0.083% IN NEBU: 3.0000 mL | INHALATION_SOLUTION | Freq: Four times a day (QID) | RESPIRATORY_TRACT | Status: DC | PRN

## 2013-08-20 MED ORDER — HEPARIN SODIUM (PORCINE) 5000 UNIT/ML IJ SOLN
5000.0000 [IU] | Freq: Three times a day (TID) | INTRAMUSCULAR | Status: DC
  Administered 2013-08-20 – 2013-08-21 (×2): 5000 [IU] via SUBCUTANEOUS
  Filled 2013-08-20 (×7): qty 1

## 2013-08-20 MED ORDER — ALBUTEROL SULFATE HFA 108 (90 BASE) MCG/ACT IN AERS: 2.0000 | INHALATION_SPRAY | Freq: Four times a day (QID) | RESPIRATORY_TRACT | Status: DC | PRN

## 2013-08-20 MED ORDER — FAMOTIDINE 20 MG PO TABS
20.0000 mg | ORAL_TABLET | Freq: Two times a day (BID) | ORAL | Status: DC
Start: 2013-08-20 — End: 2013-08-21
  Administered 2013-08-20 – 2013-08-21 (×2): 20 mg via ORAL
  Filled 2013-08-20 (×4): qty 1

## 2013-08-20 MED ORDER — MECLIZINE HCL 25 MG PO TABS
25.0000 mg | ORAL_TABLET | Freq: Three times a day (TID) | ORAL | Status: DC | PRN
  Filled 2013-08-20: qty 1

## 2013-08-20 MED ORDER — ALLOPURINOL 100 MG PO TABS
100.0000 mg | ORAL_TABLET | Freq: Every day | ORAL | Status: DC
  Administered 2013-08-21: 100 mg via ORAL
  Filled 2013-08-20 (×2): qty 1

## 2013-08-20 NOTE — Progress Notes (Signed)
UR completed 

## 2013-08-20 NOTE — Progress Notes (Signed)
Echocardiogram 2D Echocardiogram has been performed.  [name] 08/20/2013, 12:03 PM

## 2013-08-20 NOTE — ED Notes (Addendum)
Camillo, MD at bedside.  

## 2013-08-20 NOTE — ED Notes (Signed)
Pt showing some increased ROM in left leg when asked to bend knee for cath placement.

## 2013-08-20 NOTE — ED Provider Notes (Signed)
Pt transferred from [facility]. Leg numbness/weakness - left side, last normal at 1 pm. Neurology wants patient admitted.  Filed Vitals:   08/20/13 0131  BP: 123/50  Pulse: 77  Temp: 98.3 F (36.8 C)  Resp: 23     Varney Biles, MD 08/20/13 0222

## 2013-08-20 NOTE — Evaluation (Signed)
Physical Therapy Evaluation Patient Details Name: [name] MRN: [id number] DOB: [birth year] Today's Date: 08/20/2013 Time: [id number] PT Time Calculation (min): 21 min  PT Assessment / Plan / Recommendation History of Present Illness  80 y.o. female admitted to [facility] on 08/19/13 with Past medical history of hypertension, atrial fibrillation, mitral or disease, tachybradycardia syndrome status post pacemaker implant, dyslipidemia, CVA (~6 years ago affecting her left side and her vision in her left eye), gout, R TKA.  She presents with sudden onset left leg weakness and numbness.  Initial CT negative for acute infarct.  MRI not ordered due to pacemaker.  Repeat CT report not in chart yet.    Clinical Impression  Pt does demonstrate some left leg weakness.  Sensation is back to normal.  She presents with some mild gait deviations requiring one upper extremity support for balance (she would likely do well using her cane).   PT to follow acutely for deficits listed below.       PT Assessment  Patient needs continued PT services    Follow Up Recommendations  Outpatient PT;Supervision - Intermittent    Does the patient have the potential to tolerate intense rehabilitation     NA  Barriers to Discharge   None      Equipment Recommendations  None recommended by PT (pt has a cane at home)    Recommendations for Other Services   None  Frequency Min 4X/week    Precautions / Restrictions Precautions Precautions: Fall Precaution Comments: due to increased left leg weakness compared to baseline   Pertinent Vitals/Pain See vitals flow sheet.       Mobility  Bed Mobility Overal bed mobility: Modified Independent General bed mobility comments: uses bed rail for leverage when going to sitting.  Transfers Overall transfer level: Needs assistance Equipment used: 1 person hand held assist Transfers: Sit to/from Stand Sit to Stand: Min guard General transfer comment: min guard assist  for safety due to left leg weakness Ambulation/Gait Ambulation/Gait assistance: Min guard Ambulation Distance (Feet): 70 Feet Assistive device: 1 person hand held assist Gait Pattern/deviations: Step-through pattern;Decreased step length - left;Decreased weight shift to left Gait velocity: decreased, cautious General Gait Details: Pt reaching for objects in room with one hand for support.  Does show signs of decreased strength functionally and with MMT on left leg.  She was hesitant to go far for fear of fatigue and falling.   Modified Rankin (Stroke Patients Only) Pre-Morbid Rankin Score: Slight disability Modified Rankin: Moderately severe disability        PT Diagnosis: Difficulty walking;Abnormality of gait;Generalized weakness  PT Problem List: Decreased strength;Decreased activity tolerance;Decreased balance;Decreased mobility;Decreased knowledge of use of DME PT Treatment Interventions: DME instruction;Gait training;Stair training;Functional mobility training;Therapeutic activities;Therapeutic exercise;Balance training;Neuromuscular re-education;Patient/family education     PT Goals(Current goals can be found in the care plan section) Acute Rehab PT Goals [name] PT Goal Formulation: With patient/family Time For Goal Achievement: 09/03/13 Potential to Achieve Goals: Good  Visit Information  Last PT Received On: 08/20/13 Assistance Needed: +1 Reason Eval/Treat Not Completed: Patient at procedure or test/unavailable History of Present Illness: 80 y.o. female admitted to [facility] on 08/19/13 with Past medical history of hypertension, atrial fibrillation, mitral or disease, tachybradycardia syndrome status post pacemaker implant, dyslipidemia, CVA (~6 years ago affecting her left side and her vision in her left eye), gout, R TKA.  She presents with sudden onset left leg weakness and numbness.  Initial CT negative for acute  infarct.  MRI not ordered due to  pacemaker.  Repeat CT report not in chart yet.         Prior [place] expects to be discharged to:: Private residence Living Arrangements: Spouse/significant other Available Help at Discharge: Family;Available 24 hours/day Type of Home: House Home Equipment: Kasandra Knudsen - single point Additional Comments: [name] Prior Function Level of Independence: Independent;Independent with assistive device(s) Communication Communication: No difficulties Dominant Hand: Right    Cognition  Cognition Arousal/Alertness: Awake/alert Behavior During Therapy: WFL for tasks assessed/performed Overall Cognitive Status: Within Functional Limits for tasks assessed (not specifically tested)    Extremity/Trunk Assessment Upper Extremity Assessment Upper Extremity Assessment: Defer to OT evaluation (has some visible arthritis in bil hands) Lower Extremity Assessment Lower Extremity Assessment: LLE deficits/detail LLE Deficits / Details: 4/5 strength right leg throghout, no sensation deficits.  LLE Sensation:  (WNL per LT testing) Cervical / Trunk Assessment Cervical / Trunk Assessment: Normal   Balance Balance Overall balance assessment: Needs assistance Sitting-balance support: No upper extremity supported;Feet supported Sitting balance-Leahy Scale: Good Standing balance support: Single extremity supported Standing balance-Leahy Scale: Poor Standing balance comment: needs min guard assist in standing for dynamic gait tasks  End of Session PT - End of Session Equipment Utilized During Treatment: Gait belt Activity Tolerance: Patient limited by fatigue Patient left: in chair;with call bell/phone within reach;with family/visitor present  GP Functional Assessment Tool Used: assist level Functional Limitation: Mobility: Walking and moving around Mobility: Walking and Moving Around Current Status [phone]): At least 20 percent but  less than 40 percent impaired, limited or restricted Mobility: Walking and Moving Around Goal Status [phone]): At least 1 percent but less than 20 percent impaired, limited or restricted   [name] B. [place], [place], DPT [phone]   08/20/2013, 12:49 PM

## 2013-08-20 NOTE — ED Notes (Signed)
Pt transported to [facility] ED via [facility].

## 2013-08-20 NOTE — Evaluation (Signed)
Clinical/Bedside Swallow Evaluation Patient Details  Name: [name] MRN: [id number] Date of Birth: 1926-11-25  Today's Date: 08/20/2013 Time: 1345-1400 SLP Time Calculation (min): 15 min  Past Medical History:  Past Medical History  Diagnosis Date  . Tachycardia-bradycardia syndrome     with pauses and syncope;PPM implantation 10/2008,negative stress nuclear study 03/2005  . Atrial fibrillation     persistant  . Mitral valve disease     Severe mitral annular calcification; mild to moderate stenosis-not clearly rheumatic  . Hypertension     heart disease diastolic dysfunction ; [id number]; mild pulmonary edema in 2006;responded to diurectics ; LVH  . Chronic renal insufficiency     baseline creatine 1.4; 1.04 in 03/2010  . Hyperlipidemia   . Angioedema 08/2006  . Anemia     H/H of 11.3/36.7 in 12/2008 ;normal MCV; normal CBC in 2011  . Gout   . 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 Vertigo   . Shortness of breath   . Chronic bronchitis   . Blood transfusion   . DJD (degenerative joint disease)     hands, knees  . Seizure disorder   . Nephrolithiasis   . Stroke   . Retinal hemorrhage    Past Surgical History:  Past Surgical History  Procedure Laterality Date  . Insert / replace / remove pacemaker  11/2008    [employer].Jude DUAL chamber pacemaker 11/06/2008  . Total knee arthroplasty  04/2009    Right  . Cataract extraction w/phaco  03/29/2011    Procedure: CATARACT EXTRACTION PHACO AND INTRAOCULAR LENS PLACEMENT (IOC);  Surgeon: Tonny Branch;  Location: [facility];  Service: Ophthalmology;  Laterality: Right;  CDE 12.62   HPI:  80 y.o. female admitted to [facility] on 08/19/13 with Past medical history of hypertension, atrial fibrillation, mitral or disease, tachybradycardia syndrome  status post pacemaker implant, dyslipidemia, CVA (~6 years ago affecting her left side and her vision in her left eye), gout, R TKA.  She presents with sudden onset left leg weakness and numbness.  Initial CT negative for acute infarct.  MRI not ordered due to pacemaker.  Repeat CT report not in chart yet.      Assessment / Plan / Recommendation Clinical Impression  Patient presents with a functional oropharyngeal swallow without overt indication of aspiration. No SLP f/u indicated at this time.     Aspiration Risk       Diet Recommendation Regular;Thin liquid   Liquid Administration via: Cup;Straw Medication Administration: Whole meds with liquid Supervision: Patient able to self feed Compensations: Slow rate;Small sips/bites Postural Changes and/or Swallow Maneuvers: Seated upright 90 degrees    Other  Recommendations Oral Care Recommendations: Oral care BID   Follow Up Recommendations  None    Frequency and Duration        Pertinent Vitals/Pain n/a     Swallow Study Prior Functional Status  Type of Home: House Available Help at Discharge: Family;Available 24 hours/day    General HPI: 80 y.o. female admitted to [facility] on 08/19/13 with Past medical history of hypertension, atrial fibrillation, mitral or disease, tachybradycardia syndrome status post pacemaker implant, dyslipidemia, CVA (~6 years ago affecting her left side and her vision in her left eye), gout, R TKA.  She presents with sudden onset left leg weakness and numbness.  Initial CT negative  for acute infarct.  MRI not ordered due to pacemaker.  Repeat CT report not in chart yet.    Type of Study: Bedside swallow evaluation Previous Swallow Assessment: none Diet Prior to this Study: NPO Temperature Spikes Noted: No Respiratory Status: Room air History of Recent Intubation: No Behavior/Cognition: Alert;Cooperative;Pleasant mood Oral Cavity - Dentition: Adequate natural dentition Self-Feeding Abilities: Able to feed  self Patient Positioning: Upright in bed Baseline Vocal Quality: Clear Volitional Cough: Strong Volitional Swallow: Able to elicit    Oral/Motor/Sensory Function Overall Oral Motor/Sensory Function: Appears within functional limits for tasks assessed   Ice Chips Ice chips: Not tested   Thin Liquid Thin Liquid: Within functional limits Presentation: Cup;Self Fed;Straw    Nectar Thick Nectar Thick Liquid: Not tested   Honey Thick Honey Thick Liquid: Not tested   Puree Puree: Within functional limits   Solid   GO Functional Assessment Tool Used: skilled clinical judgement Functional Limitations: Swallowing Swallow Current Status [id number]): 0 percent impaired, limited or restricted Swallow Goal Status [id number]): 0 percent impaired, limited or restricted Swallow Discharge Status [phone]): 0 percent impaired, limited or restricted  Solid: Within functional limits      [name] MA, CCC-SLP [phone]  Shakeem Stern Meryl 08/20/2013,2:56 PM

## 2013-08-20 NOTE — Progress Notes (Signed)
Stroke Team Progress Note  HISTORY [name] is an 80 y.o. female with a past medical history significant for HTN, hyperlipidemia, atrial fibrillation on coumadin, tachy-brady syndrome, s/p pacemaker placement,rheumatic mitral valve disease , stroke, initially evaluated at [facility] as a code stroke due to acute onset left leg weakness. Dr. Aram Beecham spoke over the phone with the ED attending and decided against thrombolysis because of very mild deficit. She was transferred to [facility] for further management.   [name] expressed that she came back home from church 3/16 and went to sleep around 1 pm. Then woke up around 8 pm and noticed that her left leg " went to asleep, was weak, and I couldn't walk well ". Denies similar symptoms in her left arm or right side, no vertigo, double vision, slurred speech, language or vision impairment. She said that she did not have the chance to take her coumadin last night. INR 1.68 CT brain was performed at [facility] and showed no acute abnormality. NIHSS 2 here at [facility]. Last know well 08/19/13 at 1 pm   SUBJECTIVE Her husband and son are at the bedside.  Overall she feels her condition is stable.   OBJECTIVE Most recent Vital Signs: Filed Vitals:   08/20/13 0131 08/20/13 0352 08/20/13 0552 08/20/13 0833  BP: 123/50 103/58 140/56 111/55  Pulse: 77  72 76  Temp: 98.3 F (36.8 C)  98.2 F (36.8 C) 97.8 F (36.6 C)  TempSrc: Oral  Oral Oral  Resp: 23 25 18 20   Height:   5\' 1"  (1.549 m)   Weight:      SpO2: 97% 98% 98% 95%   CBG (last 3)   Recent Labs  08/20/13 0736  GLUCAP 100*    IV Fluid Intake:     MEDICATIONS  .  stroke: mapping our early stages of recovery book   Does not apply Once  . allopurinol  100 mg Oral Daily  . dorzolamide-timolol  1 drop Left Eye QHS  . famotidine  20 mg Oral BID  . heparin  5,000 Units Subcutaneous 3 times per day  . simvastatin  40 mg Oral q1800  . warfarin  5 mg Oral ONCE-1800  . Warfarin - Pharmacist Dosing  Inpatient   Does not apply q1800   PRN:  albuterol, meclizine  Diet:  NPO  Activity:   Bathroom privileges with assistance DVT Prophylaxis:  Heparin 5000 units sq tid   CLINICALLY SIGNIFICANT STUDIES Basic Metabolic Panel:   Recent Labs Lab 08/19/13 2335 08/20/13 0650  NA 141 142  K 4.1 4.6  CL 103 107  CO2 24 22  GLUCOSE 146* 105*  BUN 17 14  CREATININE 1.07 0.94  CALCIUM 10.4 10.0   Liver Function Tests:   Recent Labs Lab 08/19/13 2335 08/20/13 0650  AST 19 19  ALT 11 10  ALKPHOS 101 80  BILITOT 0.3 0.3  PROT 7.9 6.6  ALBUMIN 4.0 3.4*   CBC:   Recent Labs Lab 08/19/13 2335 08/20/13 0650  WBC 8.3 7.7  NEUTROABS 6.1 4.4  HGB 14.1 13.0  HCT 43.1 39.0  MCV 92.1 91.8  PLT 196 168   Coagulation:   Recent Labs Lab 08/13/13 1556 08/19/13 2335  LABPROT  --  19.3*  INR 2.2 1.68*   Cardiac Enzymes: No results found for this basename: CKTOTAL, CKMB, CKMBINDEX, TROPONINI,  in the last 168 hours Urinalysis:   Recent Labs Lab 08/20/13 [place] >1.030*  PHURINE 5.5  GLUCOSEU  NEGATIVE  HGBUR TRACE*  BILIRUBINUR NEGATIVE  KETONESUR NEGATIVE  PROTEINUR NEGATIVE  UROBILINOGEN 0.2  NITRITE NEGATIVE  LEUKOCYTESUR NEGATIVE   Lipid Panel    Component Value Date/Time   CHOL 180 08/20/2013 0650   TRIG 116 08/20/2013 0650   HDL 57 08/20/2013 0650   CHOLHDL 3.2 08/20/2013 0650   VLDL 23 08/20/2013 0650   LDLCALC 100* 08/20/2013 0650   HgbA1C  Lab Results  Component Value Date   HGBA1C  Value: 6.5 (NOTE) The ADA recommends the following therapeutic goal for glycemic control related to Hgb A1c measurement: Goal of therapy: <6.5 Hgb A1c  Reference: American Diabetes Association: Clinical Practice Recommendations 2010, Diabetes Care, 2010, 33: (Suppl  1).* 10/08/2008    Urine Drug Screen:     Component Value Date/Time   LABOPIA NONE DETECTED 08/20/2013 0006   COCAINSCRNUR NONE DETECTED 08/20/2013 0006   LABBENZ NONE DETECTED 08/20/2013  0006   AMPHETMU NONE DETECTED 08/20/2013 0006   THCU NONE DETECTED 08/20/2013 0006   LABBARB NONE DETECTED 08/20/2013 0006    Alcohol Level:   Recent Labs Lab 08/19/13 2335  ETH <11    CT of the brain  08/19/2013   Stable White matter changes consistent with chronic ischemia. Old lacunar infarctions. No acute abnormality.   MRI of the brain    MRA of the brain    2D Echocardiogram    Carotid Doppler    CXR  08/19/2013    1. Stable cardiomegaly, no CHF.  Cardiac pacer noted. 2. Stable pleural parenchymal thickening right lung base . No acute cardiopulmonary disease.     EKG  atrial fibrillation, rate 72. For complete results please see formal report.   Therapy Recommendations   Physical Exam   Pleasant elderly lady not in distress.Awake alert. Afebrile. Head is nontraumatic. Neck is supple without bruit. Hearing is normal. Cardiac exam no murmur or gallop. Lungs are clear to auscultation. Distal pulses are well felt. Neurological Exam ;  Awake  Alert oriented x 3. Normal speech and language.eye movements full without nystagmus.fundi were not visualized. Vision acuity and fields appear normal. Hearing is normal. Palatal movements are normal. Face symmetric. Tongue midline. Normal strength, tone, reflexes and coordination. Normal sensation. Gait deferred. ASSESSMENT [name] is a 80 y.o. female presenting with left leg weakness. Suspect a right brain infarct; Imaging pending. If present, infarct likely embolic secondary to known atrial fibrillation.  On warfarin prior to admission, but missed a dose on Sunday. Now on warfarin for secondary stroke prevention. Stroke work up underway.  atrial fibrillation, on warfarin prior to admission  Hyperlipidemia, LDL 100, on pravachol 80 mg daily PTA, now on zocor 40 mg daily, goal LDL < 100  Hx CVA - 2006, retinal artery emoblism and multiple cerebral infarcts  history of mitral valve disease secondary to rheumatic heart disease  and diastolic heart failure, on warfarin Tachy-brady syndrome, s/p pacemaker Chronic renal insufficiency  Hospital day # 1  TREATMENT/PLAN  Continue warfarin for secondary stroke prevention. Follow up as on OP with cardiology to see if transition to NOAC is appropriate.  Complete stroke workup  Therapy evals   Burnetta Sabin, MSN, RN, ANVP-BC, AGPCNP-BC [facility] Pager: [phone] 08/20/2013 9:55 AM  I have personally obtained a history, examined the patient, evaluated imaging results, and formulated the assessment and plan of care. I agree with the above. Antony Contras, MD  To contact Stroke Continuity provider, please refer to [email or web address] After hours, contact General Neurology

## 2013-08-20 NOTE — Progress Notes (Signed)
PT Cancellation Note  Patient Details Name: [name] MRN: [id number] DOB: February 01, 1927   Cancelled Treatment:    Reason Eval/Treat Not Completed: Patient at procedure or test/unavailable.  Pt back from first test, but now Echo is in room.  PT to check back later as time allows.  Thanks,    Barbarann Ehlers. [place], [place], DPT [phone]   08/20/2013, 11:29 AM

## 2013-08-20 NOTE — Consult Note (Addendum)
Referring Physician: [facility]- [facility]    Chief Complaint: CODE STROKE, LEFT LEG WEAKNESS  HPI:                                                                                                                                         [name] is an 80 y.o. female with a past medical history significant for HTN, hyperlipidemia, atrial fibrillation on coumadin, tachy-brady syndrome, s/p pacemaker placement,rheumatic mitral valve disease , stroke, initially evaluated at [facility] as a code stroke due to acute onset left leg weakness. I spoke over the phone with the ED attending and we decided against thrombolysis because of very mild deficit. She was transferred to [facility] for further management. [name] expressed that she came back home from church and went to sleep around 1 pm. Then woke up around 8 pm and noticed that her left leg " went to asleep, was weak, and I couldn't walk well ". Denies similar symptoms in her left arm or right side, no vertigo, double vision, slurred speech, language or vision impairment. She said that she did not have the chance to take her coumadin last night. INR 1.68 CT brain was performed at [facility] and showed no acute abnormality. NIHSS 2 here at [facility]   Date last known well: 08/19/13 Time last known well: 1 pm tPA Given: no, out of the window NIHSS:  MRS: 0  Past Medical History  Diagnosis Date  . Tachycardia-bradycardia syndrome     with pauses and syncope;PPM implantation 10/2008,negative stress nuclear study 03/2005  . Atrial fibrillation     persistant  . Mitral valve disease     Severe mitral annular calcification; mild to moderate stenosis-not clearly rheumatic  . Hypertension     heart disease diastolic dysfunction ; BF-38%; mild pulmonary edema in 2006;responded to diurectics ; LVH  . Chronic renal insufficiency     baseline creatine 1.4; 1.04 in 03/2010  . Hyperlipidemia   . Angioedema 08/2006  . Anemia     H/H of 11.3/36.7 in 12/2008 ;normal MCV; normal CBC  in 2011  . Gout   . 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 Vertigo   . Shortness of breath   . Chronic bronchitis   . Blood transfusion   . DJD (degenerative joint disease)     hands, knees  . Seizure disorder   . Nephrolithiasis   . Stroke   . Retinal hemorrhage     Past Surgical History  Procedure Laterality Date  . Insert / replace / remove pacemaker  11/2008    St.Jude DUAL chamber pacemaker 11/06/2008  . Total knee arthroplasty  04/2009    Right  . Cataract extraction w/phaco  03/29/2011  Procedure: CATARACT EXTRACTION PHACO AND INTRAOCULAR LENS PLACEMENT (IOC);  Surgeon: Tonny Branch;  Location: [facility];  Service: Ophthalmology;  Laterality: Right;  CDE 12.62    Family History  Problem Relation Age of Onset  . Hypotension Neg Hx   . Anesthesia problems Neg Hx   . Pseudochol deficiency Neg Hx   . Malignant hyperthermia Neg Hx   . Cancer Other     unknown cancer   Social History:  reports that she has never smoked. She has never used smokeless tobacco. She reports that she does not drink alcohol or use illicit drugs.  Allergies: No Known Allergies  Medications:                                                                                                                           I have reviewed the patient's current medications.  ROS:                                                                                                                                       History obtained from patient. and chart review  General ROS: negative for - chills, fatigue, fever, night sweats, or weight loss Psychological ROS: negative for - behavioral disorder, hallucinations, memory difficulties, mood swings or suicidal ideation Ophthalmic ROS: negative for - blurry vision,  double vision, eye pain or loss of vision ENT ROS: negative for - epistaxis, nasal discharge, oral lesions, sore throat, tinnitus or vertigo Allergy and Immunology ROS: negative for - hives or itchy/watery eyes Hematological and Lymphatic ROS: negative for - bleeding problems, bruising or swollen lymph nodes Endocrine ROS: negative for - galactorrhea, hair pattern changes, polydipsia/polyuria or temperature intolerance Respiratory ROS: negative for - cough, hemoptysis, shortness of breath or wheezing Cardiovascular ROS: negative for - chest pain, dyspnea on exertion, edema or irregular heartbeat Gastrointestinal ROS: negative for - abdominal pain, diarrhea, hematemesis, nausea/vomiting or stool incontinence Genito-Urinary ROS: negative for - dysuria, hematuria, incontinence or urinary frequency/urgency Musculoskeletal ROS: negative for - joint swelling Neurological ROS: as noted in HPI Dermatological ROS: negative for rash and skin lesion changes  Physical exam: pleasant female in no apparent distress. Blood pressure 123/50, pulse 77, temperature 98.3 F (36.8 C), temperature source Oral, resp. rate 23, height _0  (1.549 m), weight 73.029 kg (161 lb), SpO2 97.00%. Head: normocephalic. Neck: supple, no bruits, no JVD. Cardiac: no murmurs.  Lungs: clear. Abdomen: soft, no tender, no mass. Extremities: no edema.  Neurologic Examination:                                                                                                      Mental Status: Alert, oriented, thought content appropriate.  Speech fluent without evidence of aphasia.  Able to follow 3 step commands without difficulty. Cranial Nerves: II: Discs flat bilaterally; Visual fields grossly normal, pupils equal, round, reactive to light and accommodation III,IV, VI: ptosis not present, extra-ocular motions intact bilaterally V,VII: smile symmetric, facial light touch sensation normal bilaterally VIII: hearing normal  bilaterally IX,X: gag reflex present XI: bilateral shoulder shrug XII: midline tongue extension without atrophy or fasciculations Motor: Significant for mild left LE weakness. Tone and bulk:normal tone throughout; no atrophy noted Sensory: Pinprick and light touch mildly impaired left side Deep Tendon Reflexes:  1+ all over Plantars: Right: downgoing   Left: downgoing Cerebellar: normal finger-to-nose,  normal heel-to-shin test Gait: No tested CV: pulses palpable throughout    Results for orders placed during the hospital encounter of 08/19/13 (from the past 48 hour(s))  ETHANOL     Status: None   Collection Time    08/19/13 11:35 PM      Result Value Ref Range   Alcohol, Ethyl (B) <11  0 - 11 mg/dL   Comment:            LOWEST DETECTABLE LIMIT FOR     SERUM ALCOHOL IS 11 mg/dL     FOR MEDICAL PURPOSES ONLY  PROTIME-INR     Status: Abnormal   Collection Time    08/19/13 11:35 PM      Result Value Ref Range   Prothrombin Time 19.3 (*) 11.6 - 15.2 seconds   INR 1.68 (*) 0.00 - 1.49  APTT     Status: None   Collection Time    08/19/13 11:35 PM      Result Value Ref Range   aPTT 30  24 - 37 seconds  CBC     Status: None   Collection Time    08/19/13 11:35 PM      Result Value Ref Range   WBC 8.3  4.0 - 10.5 K/uL   RBC 4.68  3.87 - 5.11 MIL/uL   Hemoglobin 14.1  12.0 - 15.0 g/dL   HCT 43.1  36.0 - 46.0 %   MCV 92.1  78.0 - 100.0 fL   MCH 30.1  26.0 - 34.0 pg   MCHC 32.7  30.0 - 36.0 g/dL   RDW 13.8  11.5 - 15.5 %   Platelets 196  150 - 400 K/uL  DIFFERENTIAL     Status: None   Collection Time    08/19/13 11:35 PM      Result Value Ref Range   Neutrophils Relative % 74  43 - 77 %   Neutro Abs 6.1  1.7 - 7.7 K/uL   Lymphocytes Relative 19  12 - 46 %   Lymphs Abs 1.5  0.7 - 4.0 K/uL   Monocytes Relative 7  3 - 12 %   Monocytes Absolute 0.6  0.1 - 1.0 K/uL   Eosinophils Relative 1  0 - 5 %   Eosinophils Absolute 0.1  0.0 - 0.7 K/uL   Basophils Relative 0  0 - 1  %   Basophils Absolute 0.0  0.0 - 0.1 K/uL  COMPREHENSIVE METABOLIC PANEL     Status: Abnormal   Collection Time    08/19/13 11:35 PM      Result Value Ref Range   Sodium 141  137 - 147 mEq/L   Potassium 4.1  3.7 - 5.3 mEq/L   Chloride 103  96 - 112 mEq/L   CO2 24  19 - 32 mEq/L   Glucose, Bld 146 (*) 70 - 99 mg/dL   BUN 17  6 - 23 mg/dL   Creatinine, Ser 1.07  0.50 - 1.10 mg/dL   Calcium 10.4  8.4 - 10.5 mg/dL   Total Protein 7.9  6.0 - 8.3 g/dL   Albumin 4.0  3.5 - 5.2 g/dL   AST 19  0 - 37 U/L   ALT 11  0 - 35 U/L   Alkaline Phosphatase 101  39 - 117 U/L   Total Bilirubin 0.3  0.3 - 1.2 mg/dL   GFR calc non Af Amer 46 (*) >90 mL/min   GFR calc Af Amer 54 (*) >90 mL/min   Comment: (NOTE)     The eGFR has been calculated using the CKD EPI equation.     This calculation has not been validated in all clinical situations.     eGFR's persistently <90 mL/min signify possible Chronic Kidney     Disease.  URINE RAPID DRUG SCREEN (HOSP PERFORMED)     Status: None   Collection Time    08/20/13 12:06 AM      Result Value Ref Range   Opiates NONE DETECTED  NONE DETECTED   Cocaine NONE DETECTED  NONE DETECTED   Benzodiazepines NONE DETECTED  NONE DETECTED   Amphetamines NONE DETECTED  NONE DETECTED   Tetrahydrocannabinol NONE DETECTED  NONE DETECTED   Barbiturates NONE DETECTED  NONE DETECTED   Comment:            DRUG SCREEN FOR MEDICAL PURPOSES     ONLY.  IF CONFIRMATION IS NEEDED     FOR ANY PURPOSE, NOTIFY LAB     WITHIN 5 DAYS.                LOWEST DETECTABLE LIMITS     FOR URINE DRUG SCREEN     Drug Class       Cutoff (ng/mL)     Amphetamine      1000     Barbiturate      200     Benzodiazepine   169     Tricyclics       450     Opiates          300     Cocaine          300     THC              50  URINALYSIS, ROUTINE W REFLEX MICROSCOPIC     Status: Abnormal   Collection Time    08/20/13 12:06 AM      Result Value Ref Range   Color, Urine YELLOW  YELLOW    APPearance CLEAR  CLEAR   Specific Gravity, Urine >1.030 (*) 1.005 - 1.030   pH 5.5  5.0 - 8.0   Glucose, UA NEGATIVE  NEGATIVE mg/dL   Hgb urine dipstick TRACE (*) NEGATIVE   Bilirubin Urine NEGATIVE  NEGATIVE   Ketones, ur NEGATIVE  NEGATIVE mg/dL   Protein, ur NEGATIVE  NEGATIVE mg/dL   Urobilinogen, UA 0.2  0.0 - 1.0 mg/dL   Nitrite NEGATIVE  NEGATIVE   Leukocytes, UA NEGATIVE  NEGATIVE  URINE MICROSCOPIC-ADD ON     Status: None   Collection Time    08/20/13 12:06 AM      Result Value Ref Range   Squamous Epithelial / LPF RARE  RARE   WBC, UA 0-2  <3 WBC/hpf   RBC / HPF 0-2  <3 RBC/hpf   Bacteria, UA RARE  RARE   Dg Chest 1 View  08/19/2013   CLINICAL DATA:  Left-sided weakness.  EXAM: CHEST - 1 VIEW  COMPARISON:  Chest x-ray 03/29/2013.  FINDINGS: Stable cardiomegaly. Cardiac pacer noted with lead tips over the right atrium and right ventricle. No pulmonary venous distention. No pleural effusion. Pleural parenchymal scarring right lung base. This finding is stable. No pleural effusion or pneumothorax. No acute osseous abnormality.  IMPRESSION: 1. Stable cardiomegaly, no CHF.  Cardiac pacer noted. 2. Stable pleural parenchymal thickening right lung base . No acute cardiopulmonary disease.   Electronically Signed   By: Marcello Moores  Register   On: 08/19/2013 23:36   Ct Head Wo Contrast  08/19/2013   CLINICAL DATA:  Stroke.  EXAM: CT HEAD WITHOUT CONTRAST  TECHNIQUE: Contiguous axial images were obtained from the base of the skull through the vertex without intravenous contrast.  COMPARISON:  CT HEAD W/O CM dated 11/03/2008  FINDINGS: No mass. No hydrocephalus. No hemorrhage. Subcortical and deep white matter changes consistent chronic ischemia. Old basal ganglia lacunar infarcts. Orbits are unremarkable. Mucosal thickening right maxillary sinus consistent with sinusitis. No acute bony abnormality.  IMPRESSION: Stable white matter changes consistent with chronic ischemia. Old lacunar infarctions.  No acute abnormality. These results were called by telephone at the time of interpretation on 08/19/2013 at 11:26 PM to Dr. Shanon Rosser , who verbally acknowledged these results.   Electronically Signed   By: Marcello Moores  Register   On: 08/19/2013 23:27     Assessment: 80 y.o. female with acute onset left leg weakness. NIHSS 2. CT brain showed no acute abnormality. Patient on chronic coumadin with sub-therapeutic INR 1.68 ( but she did not take her coumadin last night) She was out of the window and had NIHSS 2 and thus tPA was not given. Possible right brain infarct involving ACA distribution. Continue coumadin. Complete stroke work up. Will follow up.   Stroke Risk Factors - HTN, hyperlipidemia, atrial fibrillation, stroke.  Plan: 1. HgbA1c, fasting lipid panel 2. CT brain in 24 hours (can not have MRI due to pacemaker) 3. Echocardiogram 4. Carotid dopplers 5. Prophylactic therapy-coumadin 6. Risk factor modification 7. Telemetry monitoring 8. Frequent neuro checks 9. PT/OT SLP  Dorian Pod, MD [facility] [phone]  08/20/2013, 1:49 AM

## 2013-08-20 NOTE — ED Notes (Signed)
Patel, MD at bedside.  

## 2013-08-20 NOTE — H&P (Signed)
[facility] History and Physical  Patient: [name]  [name]  DOB: Sep 07, 1926  DOS: the patient was seen and examined on 08/20/2013 PCP: Leonides Grills, MD  Chief Complaint: Left-sided weakness and numbness  HPI: [name] is a 80 y.o. female with Past medical history of hypertension, atrial fibrillation, mitral or disease, tachybradycardia syndrome status post pacemaker implant, dyslipidemia, CVA, gout. The patient is coming from home. The patient presented with complaints of left-sided weakness. She mentions that today he came from church and went to sleep at around 1 PM and then woke up at around 8 PM seen on this that her left leg went to sleep. She noted that and tried to walk but was not able to walk without any difficulty and felt weak. She denies any complaint in her hand, denies any vertigo slurred speech difficulty speaking. She does mentions that she has been having difficulty with her vision since last month or so and has been seeing double. She denies any dizziness lightheadedness vertigo or syncopal episode incontinence of bowel or bladder. She mentions she is not regularly compliant with her medication but has not been able to take her Coumadin last night. She's not taking her Lasix on a regular basis as she is afraid of diuresis.   Review of Systems: as mentioned in the history of present illness.  A Comprehensive review of the other systems is negative.  Past Medical History  Diagnosis Date  . Tachycardia-bradycardia syndrome     with pauses and syncope;PPM implantation 10/2008,negative stress nuclear study 03/2005  . Atrial fibrillation     persistant  . Mitral valve disease     Severe mitral annular calcification; mild to moderate stenosis-not clearly rheumatic  . Hypertension     heart disease diastolic dysfunction ; [id number]; mild pulmonary edema in 2006;responded to diurectics ; LVH  . Chronic renal insufficiency     baseline creatine  1.4; 1.04 in 03/2010  . Hyperlipidemia   . Angioedema 08/2006  . Anemia     H/H of 11.3/36.7 in 12/2008 ;normal MCV; normal CBC in 2011  . Gout   . 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 Vertigo   . Shortness of breath   . Chronic bronchitis   . Blood transfusion   . DJD (degenerative joint disease)     hands, knees  . Seizure disorder   . Nephrolithiasis   . Stroke   . Retinal hemorrhage    Past Surgical History  Procedure Laterality Date  . Insert / replace / remove pacemaker  11/2008    St.Jude DUAL chamber pacemaker 11/06/2008  . Total knee arthroplasty  04/2009    Right  . Cataract extraction w/phaco  03/29/2011    Procedure: CATARACT EXTRACTION PHACO AND INTRAOCULAR LENS PLACEMENT (IOC);  Surgeon: Tonny Branch;  Location: [facility];  Service: Ophthalmology;  Laterality: Right;  CDE 12.62   Social History:  reports that she has never smoked. She has never used smokeless tobacco. She reports that she does not drink alcohol or use illicit drugs. Independent for most of her  ADL.  No Known Allergies  Family History  Problem Relation Age of Onset  . Hypotension Neg Hx   . Anesthesia problems Neg Hx   . Pseudochol deficiency Neg Hx   . Malignant hyperthermia Neg  Hx   . Cancer Other     unknown cancer    Prior to Admission medications   Medication Sig Start Date End Date Taking? Authorizing Provider  albuterol (PROVENTIL HFA;VENTOLIN HFA) 108 (90 BASE) MCG/ACT inhaler Inhale 2 puffs into the lungs every 6 (six) hours as needed for wheezing or shortness of breath.   Yes Historical Provider, MD  allopurinol (ZYLOPRIM) 100 MG tablet Take 100 mg by mouth daily.     Yes Historical Provider, MD  diltiazem (TIAZAC) 360 MG 24 hr capsule Take 1 capsule (360 mg total) by mouth daily.  06/08/13  Yes Lendon Colonel, NP  dorzolamide-timolol (COSOPT) 22.3-6.8 MG/ML ophthalmic solution Place 1 drop into the left eye at bedtime.    Yes Historical Provider, MD  furosemide (LASIX) 40 MG tablet Take 40 mg by mouth daily.   Yes Historical Provider, MD  lisinopril (PRINIVIL,ZESTRIL) 20 MG tablet Take 20 mg by mouth daily.   Yes Historical Provider, MD  meclizine (ANTIVERT) 25 MG tablet Take 25 mg by mouth 3 (three) times daily as needed. Dizziness 11/11/10  Yes Imogene Burn, PA-C  pravastatin (PRAVACHOL) 80 MG tablet Take 80 mg by mouth at bedtime.   Yes Historical Provider, MD  ranitidine (ZANTAC) 150 MG tablet Take 150 mg by mouth at bedtime.    Yes Historical Provider, MD  warfarin (COUMADIN) 5 MG tablet Take 2.5 mg by mouth daily.   Yes Historical Provider, MD    Physical Exam: Filed Vitals:   08/19/13 2358 08/20/13 0130 08/20/13 0131 08/20/13 0352  BP: 141/58 123/50 123/50 103/58  Pulse: 68 70 77   Temp:   98.3 F (36.8 C)   TempSrc:   Oral   Resp: 21 20 23 25   Height:      Weight:      SpO2: 99% 97% 97% 98%    General: Alert, Awake and Oriented to Time, Place and Person. Appear in mild distress Eyes: PRRL, anisocoria, left tibial sluggish to react and larger in size ENT: Oral Mucosa clear moist. Neck: No JVD Cardiovascular: S1 and S2 Present, no Murmur, Peripheral Pulses Present Respiratory: Bilateral Air entry equal and Decreased, Clear to Auscultation,  No Crackles, no wheezes Abdomen: Bowel Sound Present, Soft and Non tender Skin: No Rash Extremities: Trace Pedal edema, no calf tenderness Neurologic: Left upper extremity lower extremity weakness, sensation is intact, withdraws to pain, high mental function intact, reflexes intact, Babinski negative, finger nose finger difficulty bilaterally.  Labs on Admission:  CBC:  Recent Labs Lab 08/19/13 2335  WBC 8.3  NEUTROABS 6.1  HGB 14.1  HCT 43.1  MCV 92.1  PLT 196    CMP     Component Value  Date/Time   NA 141 08/19/2013 2335   K 4.1 08/19/2013 2335   CL 103 08/19/2013 2335   CO2 24 08/19/2013 2335   GLUCOSE 146* 08/19/2013 2335   BUN 17 08/19/2013 2335   CREATININE 1.07 08/19/2013 2335   CREATININE 1.17* 04/12/2013 1244   CALCIUM 10.4 08/19/2013 2335   PROT 7.9 08/19/2013 2335   ALBUMIN 4.0 08/19/2013 2335   AST 19 08/19/2013 2335   ALT 11 08/19/2013 2335   ALKPHOS 101 08/19/2013 2335   BILITOT 0.3 08/19/2013 2335   GFRNONAA 46* 08/19/2013 2335   GFRAA 54* 08/19/2013 2335    No results found for this basename: LIPASE, AMYLASE,  in the last 168 hours No results found for this basename: AMMONIA,  in the last 168 hours  No  results found for this basename: CKTOTAL, CKMB, CKMBINDEX, TROPONINI,  in the last 168 hours BNP (last 3 results)  Recent Labs  03/18/13 1652 03/29/13 2111  PROBNP 1435.0* 1458.0*    Radiological Exams on Admission: Dg Chest 1 View  08/19/2013   CLINICAL DATA:  Left-sided weakness.  EXAM: CHEST - 1 VIEW  COMPARISON:  Chest x-ray 03/29/2013.  FINDINGS: Stable cardiomegaly. Cardiac pacer noted with lead tips over the right atrium and right ventricle. No pulmonary venous distention. No pleural effusion. Pleural parenchymal scarring right lung base. This finding is stable. No pleural effusion or pneumothorax. No acute osseous abnormality.  IMPRESSION: 1. Stable cardiomegaly, no CHF.  Cardiac pacer noted. 2. Stable pleural parenchymal thickening right lung base . No acute cardiopulmonary disease.   Electronically Signed   By: Marcello Moores  Register   On: 08/19/2013 23:36   Ct Head Wo Contrast  08/19/2013   CLINICAL DATA:  Stroke.  EXAM: CT HEAD WITHOUT CONTRAST  TECHNIQUE: Contiguous axial images were obtained from the base of the skull through the vertex without intravenous contrast.  COMPARISON:  CT HEAD W/O CM dated 11/03/2008  FINDINGS: No mass. No hydrocephalus. No hemorrhage. Subcortical and deep white matter changes consistent chronic ischemia. Old basal ganglia lacunar  infarcts. Orbits are unremarkable. Mucosal thickening right maxillary sinus consistent with sinusitis. No acute bony abnormality.  IMPRESSION: Stable white matter changes consistent with chronic ischemia. Old lacunar infarctions. No acute abnormality. These results were called by telephone at the time of interpretation on 08/19/2013 at 11:26 PM to Dr. Shanon Rosser , who verbally acknowledged these results.   Electronically Signed   By: Marcello Moores  Register   On: 08/19/2013 23:27    EKG: Independently reviewed. normal EKG, normal sinus rhythm.  Assessment/Plan Principal Problem:   TIA (transient ischemic attack) Active Problems:   Hypertension   PPM-St.Jude   Chronic anticoagulation   Atrial fibrillation   Gout   1. TIA (transient ischemic attack) The patient is presenting with complaints of left-sided weakness that started at around 8 PM has improved but still present symptoms with residual weakness on the left and finger-nose-finger bilaterally difficulty. Initial CT scan of the head is negative at present we cannot perform MRI due to presence of pacemaker. Neurologic has evaluated the patient and the patient does not appear to be a candidate for TPA due to out of window period. Repeat CT scan was begun within 24 hours they would continue warfarin for anticoagulation. Holding her blood pressure medications at present due to borderline hypotension. PTOT consultation  2. Atrial fibrillation Appears in sinus monitor on telemetry  3. Gout Continue allopurinol  Consults: Neurology  DVT Prophylaxis: subcutaneous Heparin until INR is therapeutic Nutrition: N.p.o. until speech evaluation was done  Code Status: Full  Family Communication: Family was present at bedside, opportunity was given to ask question and all questions were answered satisfactorily at the time of interview. Disposition: Admitted to observation in telemetry unit.  Author: Berle Mull, MD [facility] Pager:  [phone] 08/20/2013, 4:02 AM    If 7PM-7AM, please contact night-coverage [email or web address] Password [email or web address]

## 2013-08-20 NOTE — Progress Notes (Addendum)
PROGRESS NOTE    [name] [id number] DOB: 27-Feb-1927 DOA: 08/19/2013 PCP: Leonides Grills, MD  HPI/Brief narrative 80 year old female with history of hypertension, A. fib, tachybradycardia syndrome status post PPM, mitral valve disease, hypertension, hyperlipidemia, gout, CVA with residual left hemiparesis was admitted on 08/20/13 with complaints of left leg weakness and numbness which lasted for a couple of hours then improved by the time she came to the ED. Neurology has seen and suspect a right brain infarct-likely embolic secondary to atrial fibrillation and subtherapeutic anticoagulation.   Assessment/Plan:  1. Possible right brain CVA: Likely embolic secondary to atrial fibrillation and subtherapeutic INR. Patient unable to get MRI secondary to pacemaker. 2 D Echo & CTA head and neck findings as below. Neurology consultation and followup appreciated. LDL 100-on pravastatin 80 mg daily PTA and now on Zocor 40 mg daily-goal LDL <100. Continue warfarin for secondary stroke prevention in followup with outpatient cardiology to see if NOAC is appropriate. Therapies evaluation 2. Atrial fibrillation: Controlled ventricular rate. INR subtherapeutic on admission. Coumadin per pharmacy. INR: 1.68 3. Hypertension: Controlled 4. Hyperlipidemia: Continue statins 5. History of tachybradycardia syndrome, status post PPM 6. History of gout: Continue allopurinol   Code Status: Full Family Communication: Discussed with spouse at bedside Disposition Plan: Home when medically stable   Consultants:  Neurology  Procedures:  None  Antibiotics:  None   Subjective: Patient states that her left leg numbness and weakness seem to have resolved. She has residual left-sided weakness from prior stroke. Denies any other complaints.  Objective: Filed Vitals:   08/20/13 0833 08/20/13 1039 08/20/13 1200 08/20/13 1446  BP: 111/55 148/50 156/55 159/59  Pulse: 76 72 75 78  Temp: 97.8 F  (36.6 C) 97.7 F (36.5 C) 97.8 F (36.6 C) 97.2 F (36.2 C)  TempSrc: Oral Oral Oral Oral  Resp: 20 18 20 18   Height:      Weight:      SpO2: 95% 98% 100% 98%    Intake/Output Summary (Last 24 hours) at 08/20/13 1620 Last data filed at 08/20/13 0356  Gross per 24 hour  Intake      0 ml  Output    325 ml  Net   -325 ml   Filed Weights   08/19/13 2212  Weight: 73.029 kg (161 lb)     Exam:  General exam: Pleasant elderly female lying comfortably in bed. Respiratory system: Clear. No increased work of breathing. Cardiovascular system: S1 & S2 heard, irregularly irregular. No JVD, murmurs, gallops, clicks or pedal edema. Telemetry: Atrial fibrillation with controlled ventricular rate. Gastrointestinal system: Abdomen is nondistended, soft and nontender. Normal bowel sounds heard. Central nervous system: Alert and oriented. No focal neurological deficits. Extremities: Right limbs 5 x 5 power. Left limbs grade 4 x 5 power-weaker in lower extremity   Data Reviewed: Basic Metabolic Panel:  Recent Labs Lab 08/19/13 2335 08/20/13 0650  NA 141 142  K 4.1 4.6  CL 103 107  CO2 24 22  GLUCOSE 146* 105*  BUN 17 14  CREATININE 1.07 0.94  CALCIUM 10.4 10.0   Liver Function Tests:  Recent Labs Lab 08/19/13 2335 08/20/13 0650  AST 19 19  ALT 11 10  ALKPHOS 101 80  BILITOT 0.3 0.3  PROT 7.9 6.6  ALBUMIN 4.0 3.4*   No results found for this basename: LIPASE, AMYLASE,  in the last 168 hours No results found for this basename: AMMONIA,  in the last 168 hours CBC:  Recent Labs Lab 08/19/13  2335 08/20/13 0650  WBC 8.3 7.7  NEUTROABS 6.1 4.4  HGB 14.1 13.0  HCT 43.1 39.0  MCV 92.1 91.8  PLT 196 168   Cardiac Enzymes: No results found for this basename: CKTOTAL, CKMB, CKMBINDEX, TROPONINI,  in the last 168 hours BNP (last 3 results)  Recent Labs  03/18/13 1652 03/29/13 2111  PROBNP 1435.0* 1458.0*   CBG:  Recent Labs Lab 08/20/13 0736 08/20/13 1209    GLUCAP 100* 92    No results found for this or any previous visit (from the past 240 hour(s)).    Additional labs: 1. 2-D echo 08/20/13: Study Conclusions  - Left ventricle: The cavity size was normal. Wall thickness was increased in a pattern of mild LVH. Systolic function was vigorous. The estimated ejection fraction was in the range of 65% to 70%. Wall motion was normal; there were no regional wall motion abnormalities. - Mitral valve: The findings are consistent with mild to moderate stenosis. Mild regurgitation. Valve area by pressure half-time: 1.61cm^2. - Left atrium: The atrium was mildly to moderately dilated. - Pulmonary arteries: Systolic pressure was mildly increased. PA peak pressure: 27mm Hg (S). 2. Fasting lipids: Cholesterol 180, triglycerides 116, HDL 57, LDL 100 & VLDL 23         Studies: Dg Chest 1 View  08/19/2013   CLINICAL DATA:  Left-sided weakness.  EXAM: CHEST - 1 VIEW  COMPARISON:  Chest x-ray 03/29/2013.  FINDINGS: Stable cardiomegaly. Cardiac pacer noted with lead tips over the right atrium and right ventricle. No pulmonary venous distention. No pleural effusion. Pleural parenchymal scarring right lung base. This finding is stable. No pleural effusion or pneumothorax. No acute osseous abnormality.  IMPRESSION: 1. Stable cardiomegaly, no CHF.  Cardiac pacer noted. 2. Stable pleural parenchymal thickening right lung base . No acute cardiopulmonary disease.   Electronically Signed   By: Marcello Moores  Register   On: 08/19/2013 23:36   Ct Head Wo Contrast  08/19/2013   CLINICAL DATA:  Stroke.  EXAM: CT HEAD WITHOUT CONTRAST  TECHNIQUE: Contiguous axial images were obtained from the base of the skull through the vertex without intravenous contrast.  COMPARISON:  CT HEAD W/O CM dated 11/03/2008  FINDINGS: No mass. No hydrocephalus. No hemorrhage. Subcortical and deep white matter changes consistent chronic ischemia. Old basal ganglia lacunar infarcts. Orbits are  unremarkable. Mucosal thickening right maxillary sinus consistent with sinusitis. No acute bony abnormality.  IMPRESSION: Stable white matter changes consistent with chronic ischemia. Old lacunar infarctions. No acute abnormality. These results were called by telephone at the time of interpretation on 08/19/2013 at 11:26 PM to Dr. Shanon Rosser , who verbally acknowledged these results.   Electronically Signed   By: Marcello Moores  Register   On: 08/19/2013 23:27   Ct Angio Neck W/cm &/or Wo/cm  08/20/2013   CLINICAL DATA:  80 year old female with left side weakness. Code stroke on 08/19/2013. Initial encounter. Atrial fibrillation.  EXAM: CT ANGIOGRAPHY HEAD AND NECK  TECHNIQUE: Multidetector CT imaging of the head and neck was performed using the standard protocol during bolus administration of intravenous contrast. Multiplanar CT image reconstructions and MIPs were obtained to evaluate the vascular anatomy. Carotid stenosis measurements (when applicable) are obtained utilizing NASCET criteria, using the distal internal carotid diameter as the denominator.  CONTRAST:  50 mL Omnipaque 350.  COMPARISON:  Noncontrast head CTs 08/19/2013 and earlier. Brain MRI 10/04/2008.  FINDINGS: CTA HEAD FINDINGS  No acute osseous abnormality identified. Visualized scalp soft tissues are within normal limits.  No ventriculomegaly. No midline shift, mass effect, or evidence of intracranial mass lesion. Numerous chronic lacunar infarcts in the deep gray matter nuclei and also the cerebellum (greater on the left). Patchy and confluent cerebral white matter hypodensity is stable. No evidence of cortically based acute infarction identified. No acute intracranial hemorrhage identified. No abnormal enhancement identified.  VASCULAR FINDINGS:  Dominant distal left vertebral artery with mild to moderate calcified plaque in the V4 segment proximal to the patent left PICA origin. Stenosis here is mild to moderate. Normal left PICA origin.  Calcified  plaque in the non dominant right V4 segment just proximal to the patent right PICA origin. The right vertebral artery functionally terminates in PICA.  The basilar artery is patent without stenosis. AICA and SCA origins are patent. Fetal type PCA origins, more so the right. Normal bilateral PCA branches. Normal posterior communicating arteries.  Heavy calcified plaque in both ICA siphons, maximal in the cavernous and supra clinoid segments. Still, no hemodynamically significant stenosis occurs on the right. There is stenosis of approximately 50 % with respect to the distal vessel in the distal left cavernous segment (series 6, image 103). Normal ophthalmic and posterior communicating artery origins. Normal carotid termini.  Normal MCA and ACA origins. Diminutive or absent anterior communicating artery. Bilateral ACA branches are within normal limits. Left MCA branches are within normal limits. Right MCA M1 segment is within normal limits. Right MCA bifurcation is patent. Right MCA branches are within normal limits.  Review of the MIP images confirms the above findings.  CTA NECK FINDINGS  Left chest cardiac pacemaker device. Negative lung parenchyma. Mediastinal lipomatosis. No superior mediastinal lymphadenopathy. Small bilateral low-density thyroid nodules, the largest 9 mm. No followup indicated in this age group. Negative larynx, pharynx, parapharyngeal spaces, sublingual space, orbits soft tissues (dysconjugate gaze, postoperative changes to the right globe), and parotid glands. Negative retropharyngeal space except for are retropharyngeal course of the carotids. Submandibular glands are within normal limits. No cervical lymphadenopathy. Chronic right maxillary sinusitis with mucoperiosteal thickening and partially calcified internal contents. Osteopenia. Multilevel degenerative changes in the cervical spine. No acute osseous abnormality identified.  VASCULAR FINDINGS: Extensive calcified and soft plaque at the  aortic arch. Three vessel arch configuration. Despite the atherosclerosis, no great vessel origin stenosis.  No right CCA origin stenosis. Intermittent soft and calcified plaque in the right CCA. Bulky calcified plaque at the right carotid bifurcation mostly affecting the ECA origin (coronal series 8079, image 112). No left ICA origin stenosis. Calcified plaque in the left ICA bulb resulting in less than 50 % with respect to the distal vessel. Focal severe tortuosity of the right ICA just be on the bulb resulting in a kinked appearance at 2 levels (coronal images 105 and 113). Otherwise negative cervical right ICA.  No proximal right subclavian artery stenosis. Heavily calcified right subclavian artery in the area of the right vertebral origin. Calcified plaque in the proximal right vertebral. Up to moderate stenosis at the origin suspected (coronal image 147). The right vertebral artery is non dominant and intermittently tortuous. No other or right vertebral artery stenosis in the neck or at the skullbase.  Patent left CCA origin without stenosis. Heavy calcification at the left carotid bifurcation mostly affecting the ECA origin (coronal image 120). No left ICA origin stenosis. Mild calcified plaque in the left ICA bulb without stenosis. Tortuous left ICA with a mildly kinked appearance (sagittal series 80710, image 105). Otherwise negative cervical left ICA.  Calcified plaque in the proximal  left subclavian artery, bulky in areas (series 6, image 27), but without hemodynamically significant stenosis identified. Headache calcification at the left vertebral artery origin which is patent with up to only mild stenosis. The left vertebral artery is mildly dominant. The V2 segment is intermittently tortuous. No other cervical left vertebral stenosis.  Review of the MIP images confirms the above findings.  IMPRESSION: 1. Extensive calcified plaque in the aortic arch and neck. Bilateral ECA origins most affected. No  hemodynamically significant carotid stenosis in the neck. Moderate stenosis at the origin of the nondominant right vertebral artery. 2. Extensive calcified plaque of both ICA siphons. Approximately 50% stenosis of the cavernous left ICA segment. Distal vertebral artery plaque resulting in moderate stenosis. 3. Otherwise negative intracranial CTA. 4. Extensive chronic small vessel disease. No acute intracranial abnormality identified.   Electronically Signed   By: Lars Pinks M.D.   On: 08/20/2013 13:27        Scheduled Meds: . allopurinol  100 mg Oral Daily  . dorzolamide-timolol  1 drop Left Eye QHS  . famotidine  20 mg Oral BID  . heparin  5,000 Units Subcutaneous 3 times per day  . simvastatin  40 mg Oral q1800  . warfarin  5 mg Oral ONCE-1800  . Warfarin - Pharmacist Dosing Inpatient   Does not apply q1800   Continuous Infusions:   Principal Problem:   TIA (transient ischemic attack) Active Problems:   Hypertension   PPM-St.Jude   Chronic anticoagulation   Atrial fibrillation   Gout    Time spent: 35 minutes.    Vernell Leep, MD, FACP, FHM. [facility] Pager [phone]  If 7PM-7AM, please contact night-coverage [email or web address] Password [email or web address] 08/20/2013, 4:20 PM    LOS: 1 day

## 2013-08-20 NOTE — Progress Notes (Signed)
ANTICOAGULATION CONSULT NOTE - Initial Consult  Pharmacy Consult for Coumadin Indication: atrial fibrillation  No Known Allergies  Patient Measurements: Height: 5\' 1"  (154.9 cm) Weight: 161 lb (73.029 kg) IBW/kg (Calculated) : 47.8  Vital Signs: Temp: 98.2 F (36.8 C) (03/16 0552) Temp src: Oral (03/16 0552) BP: 140/56 mmHg (03/16 0552) Pulse Rate: 72 (03/16 0552)  Labs:  Recent Labs  08/19/13 2335  HGB 14.1  HCT 43.1  PLT 196  APTT 30  LABPROT 19.3*  INR 1.68*  CREATININE 1.07    Estimated Creatinine Clearance: 35.8 ml/min (by C-G formula based on Cr of 1.07).   Medical History: Past Medical History  Diagnosis Date  . Tachycardia-bradycardia syndrome     with pauses and syncope;PPM implantation 10/2008,negative stress nuclear study 03/2005  . Atrial fibrillation     persistant  . Mitral valve disease     Severe mitral annular calcification; mild to moderate stenosis-not clearly rheumatic  . Hypertension     heart disease diastolic dysfunction ; 123456; mild pulmonary edema in 2006;responded to diurectics ; LVH  . Chronic renal insufficiency     baseline creatine 1.4; 1.04 in 03/2010  . Hyperlipidemia   . Angioedema 08/2006  . Anemia     H/H of 11.3/36.7 in 12/2008 ;normal MCV; normal CBC in 2011  . Gout   . 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 Vertigo   . Shortness of breath   . Chronic bronchitis   . Blood transfusion   . DJD (degenerative joint disease)     hands, knees  . Seizure disorder   . Nephrolithiasis   . Stroke   . Retinal hemorrhage     Medications:  Prescriptions prior to admission  Medication Sig Dispense Refill  . albuterol (PROVENTIL HFA;VENTOLIN HFA) 108 (90 BASE) MCG/ACT inhaler Inhale 2 puffs into the lungs every 6 (six)  hours as needed for wheezing or shortness of breath.      . allopurinol (ZYLOPRIM) 100 MG tablet Take 100 mg by mouth daily.        Marland Kitchen diltiazem (TIAZAC) 360 MG 24 hr capsule Take 1 capsule (360 mg total) by mouth daily.  30 capsule  6  . dorzolamide-timolol (COSOPT) 22.3-6.8 MG/ML ophthalmic solution Place 1 drop into the left eye at bedtime.       . furosemide (LASIX) 40 MG tablet Take 40 mg by mouth daily.      Marland Kitchen lisinopril (PRINIVIL,ZESTRIL) 20 MG tablet Take 20 mg by mouth daily.      . meclizine (ANTIVERT) 25 MG tablet Take 25 mg by mouth 3 (three) times daily as needed. Dizziness      . pravastatin (PRAVACHOL) 80 MG tablet Take 80 mg by mouth at bedtime.      . ranitidine (ZANTAC) 150 MG tablet Take 150 mg by mouth at bedtime.       Marland Kitchen warfarin (COUMADIN) 5 MG tablet Take 2.5 mg by mouth daily.       Scheduled:  .  stroke: mapping our early stages of recovery book   Does not apply Once  . allopurinol  100 mg Oral Daily  . dorzolamide-timolol  1 drop Left Eye QHS  . famotidine  20 mg Oral BID  . heparin  5,000 Units Subcutaneous 3 times per day  . simvastatin  40 mg Oral q1800  . warfarin  5 mg Oral ONCE-1800  . Warfarin - Pharmacist Dosing Inpatient   Does not apply q1800    Assessment: 80yo female c/o LE numbness, called as code stroke, no tPA d/t window/NIHSS, to continue Coumadin during admission, admitted w/ subtherapeutic INR, missed dose PTA 3/15.  Goal of Therapy:  INR 2-3   Plan:  Will give boosted Coumadin dose of 5mg  po x1 today and monitor INR for dose adjustments.  Wynona Neat, PharmD, BCPS  08/20/2013,6:33 AM

## 2013-08-20 NOTE — Progress Notes (Signed)
OT Cancellation Note  Patient Details Name: [name] MRN: [id number] DOB: 02/06/1927   Cancelled Treatment:    Reason Eval/Treat Not Completed: Other (comment). Orders received for vestibular evaluation, will defer to PT. If any OT needs identified, please order. Thank you  [name], Happy 08/20/2013, 10:53 AM

## 2013-08-20 NOTE — Progress Notes (Signed)
*  PRELIMINARY RESULTS* Vascular Ultrasound Carotid Duplex (Doppler) has been completed.   Findings suggest 1-39% internal carotid artery stenosis bilaterally. Unable to visualize bilateral vertebral arteries.  08/20/2013 3:47 PM Maudry Mayhew, RVT, RDCS, RDMS

## 2013-08-21 DIAGNOSIS — I495 Sick sinus syndrome: Secondary | ICD-10-CM

## 2013-08-21 LAB — LIPID PANEL
CHOL/HDL RATIO: 3.3 ratio
Cholesterol: 183 mg/dL (ref 0–200)
HDL: 56 mg/dL (ref >39)
LDL Cholesterol: 103 mg/dL — ABNORMAL HIGH (ref 0–99)
Triglycerides: 120 mg/dL (ref <150)
VLDL: 24 mg/dL (ref 0–40)

## 2013-08-21 LAB — GLUCOSE, CAPILLARY
Glucose-Capillary: 98 mg/dL (ref 70–99)
Glucose-Capillary: 101 mg/dL — ABNORMAL HIGH (ref 70–99)

## 2013-08-21 LAB — PROTIME-INR
INR: 1.53 — ABNORMAL HIGH (ref 0.00–1.49)
Prothrombin Time: 18 seconds — ABNORMAL HIGH (ref 11.6–15.2)

## 2013-08-21 LAB — HEMOGLOBIN A1C
HEMOGLOBIN A1C: 6.1 % — AB (ref <5.7)
Mean Plasma Glucose: 128 mg/dL — ABNORMAL HIGH (ref <117)

## 2013-08-21 MED ORDER — WARFARIN SODIUM 5 MG PO TABS: 5.0000 mg | ORAL_TABLET | Freq: Every day | ORAL | Status: DC

## 2013-08-21 NOTE — Progress Notes (Signed)
[facility] PROGRESS NOTE Interim History: 80 year old female with history of hypertension, A. fib, tachybradycardia syndrome status post PPM, mitral valve disease, hypertension, hyperlipidemia, gout, CVA with residual left hemiparesis was admitted on 08/20/13 with complaints of left leg weakness and numbness which lasted for a couple of hours then improved by the time she came to the ED. Neurology has seen and suspect a right brain infarct-likely embolic secondary to atrial fibrillation and subtherapeutic anticoagulation  Assessment/Plan: TIA/CVA - Likely embolic secondary to atrial fibrillation and subtherapeutic INR - Not able to get MRI due to Pacemaker. - Echo 3.16.2015: ejection fraction was in the range of 65% to 70%. Mitral valve: The findings are consistent with mild to moderate stenosis. Mild regurgitation. Valve area by pressure half-time: 1.61cm^2. - INR subtherapeutic.  Atrial fibrillation/PPM-St.Jude pacemaker/Tachy-brady syndrome, s/p pacemaker:  - Controlled ventricular rate. INR subtherapeutic on admission. Coumadin per pharmacy. INR: 1.68  Moderate Aortic stenosis: - Stable. - Follow up with cardiology.  Hypertension - stable.   CKD III: - stable.  Code Status: Full  Family Communication: Discussed with spouse at bedside  Disposition Plan: Home when medically stable    Consultants:  neurology  Procedures:  CT angio head and neck  CXR  Antibiotics:  none  HPI/Subjective: No complains.  Objective: Filed Vitals:   08/20/13 1800 08/20/13 2000 08/21/13 0006 08/21/13 0349  BP: 143/66 166/73 139/49 166/69  Pulse: 81 80 79 79  Temp: 98 F (36.7 C) 97.7 F (36.5 C) 98.2 F (36.8 C) 98.4 F (36.9 C)  TempSrc: Oral Oral Oral Oral  Resp: 18 18 18 18   Height:      Weight:      SpO2: 95% 100% 96% 100%    Intake/Output Summary (Last 24 hours) at 08/21/13 1019 Last data filed at 08/21/13 0804  Gross per 24 hour  Intake    240 ml   Output      0 ml  Net    240 ml   Filed Weights   08/19/13 2212  Weight: 73.029 kg (161 lb)    Exam:  General: Alert, awake, oriented x3, in no acute distress.  HEENT: No bruits, no goiter.  Heart: Regular rate and rhythm, without murmurs, rubs, gallops.  Lungs: Good air movement, clear. Abdomen: Soft, nontender, nondistended, positive bowel sounds.  Neuro: Grossly intact, nonfocal.   Data Reviewed: Basic Metabolic Panel:  Recent Labs Lab 08/19/13 2335 08/20/13 0650  NA 141 142  K 4.1 4.6  CL 103 107  CO2 24 22  GLUCOSE 146* 105*  BUN 17 14  CREATININE 1.07 0.94  CALCIUM 10.4 10.0   Liver Function Tests:  Recent Labs Lab 08/19/13 2335 08/20/13 0650  AST 19 19  ALT 11 10  ALKPHOS 101 80  BILITOT 0.3 0.3  PROT 7.9 6.6  ALBUMIN 4.0 3.4*   No results found for this basename: LIPASE, AMYLASE,  in the last 168 hours No results found for this basename: AMMONIA,  in the last 168 hours CBC:  Recent Labs Lab 08/19/13 2335 08/20/13 0650  WBC 8.3 7.7  NEUTROABS 6.1 4.4  HGB 14.1 13.0  HCT 43.1 39.0  MCV 92.1 91.8  PLT 196 168   Cardiac Enzymes: No results found for this basename: CKTOTAL, CKMB, CKMBINDEX, TROPONINI,  in the last 168 hours BNP (last 3 results)  Recent Labs  03/18/13 1652 03/29/13 2111  PROBNP 1435.0* 1458.0*   CBG:  Recent Labs Lab 08/20/13 0736 08/20/13 1209 08/20/13 1645 08/20/13 2048 08/21/13 [id number]  GLUCAP 100* 92 84 91 98    No results found for this or any previous visit (from the past 240 hour(s)).   Studies: Dg Chest 1 View  08/19/2013   CLINICAL DATA:  Left-sided weakness.  EXAM: CHEST - 1 VIEW  COMPARISON:  Chest x-ray 03/29/2013.  FINDINGS: Stable cardiomegaly. Cardiac pacer noted with lead tips over the right atrium and right ventricle. No pulmonary venous distention. No pleural effusion. Pleural parenchymal scarring right lung base. This finding is stable. No pleural effusion or pneumothorax. No acute osseous  abnormality.  IMPRESSION: 1. Stable cardiomegaly, no CHF.  Cardiac pacer noted. 2. Stable pleural parenchymal thickening right lung base . No acute cardiopulmonary disease.   Electronically Signed   By: Marcello Moores  Register   On: 08/19/2013 23:36   Ct Head Wo Contrast  08/19/2013   CLINICAL DATA:  Stroke.  EXAM: CT HEAD WITHOUT CONTRAST  TECHNIQUE: Contiguous axial images were obtained from the base of the skull through the vertex without intravenous contrast.  COMPARISON:  CT HEAD W/O CM dated 11/03/2008  FINDINGS: No mass. No hydrocephalus. No hemorrhage. Subcortical and deep white matter changes consistent chronic ischemia. Old basal ganglia lacunar infarcts. Orbits are unremarkable. Mucosal thickening right maxillary sinus consistent with sinusitis. No acute bony abnormality.  IMPRESSION: Stable white matter changes consistent with chronic ischemia. Old lacunar infarctions. No acute abnormality. These results were called by telephone at the time of interpretation on 08/19/2013 at 11:26 PM to Dr. Shanon Rosser , who verbally acknowledged these results.   Electronically Signed   By: Marcello Moores  Register   On: 08/19/2013 23:27   Ct Angio Neck W/cm &/or Wo/cm  08/20/2013   CLINICAL DATA:  80 year old female with left side weakness. Code stroke on 08/19/2013. Initial encounter. Atrial fibrillation.  EXAM: CT ANGIOGRAPHY HEAD AND NECK  TECHNIQUE: Multidetector CT imaging of the head and neck was performed using the standard protocol during bolus administration of intravenous contrast. Multiplanar CT image reconstructions and MIPs were obtained to evaluate the vascular anatomy. Carotid stenosis measurements (when applicable) are obtained utilizing NASCET criteria, using the distal internal carotid diameter as the denominator.  CONTRAST:  50 mL Omnipaque 350.  COMPARISON:  Noncontrast head CTs 08/19/2013 and earlier. Brain MRI 10/04/2008.  FINDINGS: CTA HEAD FINDINGS  No acute osseous abnormality identified. Visualized scalp  soft tissues are within normal limits.  No ventriculomegaly. No midline shift, mass effect, or evidence of intracranial mass lesion. Numerous chronic lacunar infarcts in the deep gray matter nuclei and also the cerebellum (greater on the left). Patchy and confluent cerebral white matter hypodensity is stable. No evidence of cortically based acute infarction identified. No acute intracranial hemorrhage identified. No abnormal enhancement identified.  VASCULAR FINDINGS:  Dominant distal left vertebral artery with mild to moderate calcified plaque in the V4 segment proximal to the patent left PICA origin. Stenosis here is mild to moderate. Normal left PICA origin.  Calcified plaque in the non dominant right V4 segment just proximal to the patent right PICA origin. The right vertebral artery functionally terminates in PICA.  The basilar artery is patent without stenosis. AICA and SCA origins are patent. Fetal type PCA origins, more so the right. Normal bilateral PCA branches. Normal posterior communicating arteries.  Heavy calcified plaque in both ICA siphons, maximal in the cavernous and supra clinoid segments. Still, no hemodynamically significant stenosis occurs on the right. There is stenosis of approximately 50 % with respect to the distal vessel in the distal left cavernous segment (series  6, image 103). Normal ophthalmic and posterior communicating artery origins. Normal carotid termini.  Normal MCA and ACA origins. Diminutive or absent anterior communicating artery. Bilateral ACA branches are within normal limits. Left MCA branches are within normal limits. Right MCA M1 segment is within normal limits. Right MCA bifurcation is patent. Right MCA branches are within normal limits.  Review of the MIP images confirms the above findings.  CTA NECK FINDINGS  Left chest cardiac pacemaker device. Negative lung parenchyma. Mediastinal lipomatosis. No superior mediastinal lymphadenopathy. Small bilateral low-density  thyroid nodules, the largest 9 mm. No followup indicated in this age group. Negative larynx, pharynx, parapharyngeal spaces, sublingual space, orbits soft tissues (dysconjugate gaze, postoperative changes to the right globe), and parotid glands. Negative retropharyngeal space except for are retropharyngeal course of the carotids. Submandibular glands are within normal limits. No cervical lymphadenopathy. Chronic right maxillary sinusitis with mucoperiosteal thickening and partially calcified internal contents. Osteopenia. Multilevel degenerative changes in the cervical spine. No acute osseous abnormality identified.  VASCULAR FINDINGS: Extensive calcified and soft plaque at the aortic arch. Three vessel arch configuration. Despite the atherosclerosis, no great vessel origin stenosis.  No right CCA origin stenosis. Intermittent soft and calcified plaque in the right CCA. Bulky calcified plaque at the right carotid bifurcation mostly affecting the ECA origin (coronal series 8079, image 112). No left ICA origin stenosis. Calcified plaque in the left ICA bulb resulting in less than 50 % with respect to the distal vessel. Focal severe tortuosity of the right ICA just be on the bulb resulting in a kinked appearance at 2 levels (coronal images 105 and 113). Otherwise negative cervical right ICA.  No proximal right subclavian artery stenosis. Heavily calcified right subclavian artery in the area of the right vertebral origin. Calcified plaque in the proximal right vertebral. Up to moderate stenosis at the origin suspected (coronal image 147). The right vertebral artery is non dominant and intermittently tortuous. No other or right vertebral artery stenosis in the neck or at the skullbase.  Patent left CCA origin without stenosis. Heavy calcification at the left carotid bifurcation mostly affecting the ECA origin (coronal image 120). No left ICA origin stenosis. Mild calcified plaque in the left ICA bulb without stenosis.  Tortuous left ICA with a mildly kinked appearance (sagittal series 80710, image 105). Otherwise negative cervical left ICA.  Calcified plaque in the proximal left subclavian artery, bulky in areas (series 6, image 27), but without hemodynamically significant stenosis identified. Headache calcification at the left vertebral artery origin which is patent with up to only mild stenosis. The left vertebral artery is mildly dominant. The V2 segment is intermittently tortuous. No other cervical left vertebral stenosis.  Review of the MIP images confirms the above findings.  IMPRESSION: 1. Extensive calcified plaque in the aortic arch and neck. Bilateral ECA origins most affected. No hemodynamically significant carotid stenosis in the neck. Moderate stenosis at the origin of the nondominant right vertebral artery. 2. Extensive calcified plaque of both ICA siphons. Approximately 50% stenosis of the cavernous left ICA segment. Distal vertebral artery plaque resulting in moderate stenosis. 3. Otherwise negative intracranial CTA. 4. Extensive chronic small vessel disease. No acute intracranial abnormality identified.   Electronically Signed   By: Lars Pinks M.D.   On: 08/20/2013 13:27    Scheduled Meds: . allopurinol  100 mg Oral Daily  . dorzolamide-timolol  1 drop Left Eye QHS  . famotidine  20 mg Oral BID  . heparin  5,000 Units Subcutaneous 3 times per  day  . simvastatin  40 mg Oral q1800  . Warfarin - Pharmacist Dosing Inpatient   Does not apply q1800   Continuous Infusions:    Charlynne Cousins  [facility] Pager [phone]. If 8PM-8AM, please contact night-coverage at [email or web address], password [facility] 08/21/2013, 10:19 AM  LOS: 2 days

## 2013-08-21 NOTE — Care Management (Signed)
1135 08-21-13  CM did fax information to the outpatient rehab at [facility] for outpatient PT services. Rehab will call the pt for time of visit. Bethena Roys, RN,BSN [phone]

## 2013-08-21 NOTE — Discharge Summary (Signed)
Physician Discharge Summary  [name] [id number] DOB: 08/27/1926 DOA: 08/19/2013  PCP: Leonides Grills, MD  Admit date: 08/19/2013 Discharge date: 08/21/2013  Time spent: 35 minutes  Recommendations for Outpatient Follow-up:  1. Follow up with neurology in 1-2 week.  Discharge Diagnoses:  Principal Problem:   TIA (transient ischemic attack) Active Problems:   Hypertension   PPM-St.Jude   Chronic anticoagulation   Atrial fibrillation   Gout   Discharge Condition: stable  Diet recommendation: heart healthy  Filed Weights   08/19/13 2212  Weight: 73.029 kg (161 lb)    History of present illness:  80 y.o. female with Past medical history of hypertension, atrial fibrillation, mitral or disease, tachybradycardia syndrome status post pacemaker implant, dyslipidemia, CVA, gout.  The patient is coming from home.  The patient presented with complaints of left-sided weakness. She mentions that today he came from church and went to sleep at around 1 PM and then woke up at around 8 PM seen on this that her left leg went to sleep. She noted that and tried to walk but was not able to walk without any difficulty and felt weak. She denies any complaint in her hand, denies any vertigo slurred speech difficulty speaking. She does mentions that she has been having difficulty with her vision since last month or so and has been seeing double.  She denies any dizziness lightheadedness vertigo or syncopal episode incontinence of bowel or bladder.  She mentions she is not regularly compliant with her medication but has not been able to take her Coumadin last night. She's not taking her Lasix on a regular basis as she is afraid of diuresis.      Hospital Course:  TIA/CVA  - Likely embolic secondary to atrial fibrillation and subtherapeutic INR. Neurology consulted. - Not able to get MRI due to Pacemaker.  - Echo 3.16.2015: ejection fraction was in the range of 65% to 70%. Mitral valve:  The findings are consistent with mild to moderate stenosis. Mild regurgitation. Valve area by pressure half-time: 1.61cm^2. - INR subtherapeutic.  - resume coumadin as an outpatient and close follow up with coumadin clinic.  Atrial fibrillation/PPM-St.Jude pacemaker/Tachy-brady syndrome, s/p pacemaker: - Controlled ventricular rate. INR subtherapeutic on admission.  - Coumadin follow up with coumadin clinic.  Moderate Aortic stenosis:  - Stable.  - Follow up with cardiology.   Hypertension  - stable.   CKD III:  - stable.   Consultants:  neurology Procedures:  CT angio head and neck  CXR   Discharge Exam: Filed Vitals:   08/21/13 0349  BP: 166/69  Pulse: 79  Temp: 98.4 F (36.9 C)  Resp: 18    General: See progress note  Discharge Instructions      Discharge Orders   Future Appointments Provider Department Dept Phone   08/24/2013 9:30 AM Evans Lance, MD [facility] [place] [phone]   09/24/2013 1:40 PM [facility] [facility] [place] [phone]   Future Orders Complete By Expires   Diet - low sodium heart healthy  As directed    Increase activity slowly  As directed        Medication List         albuterol 108 (90 BASE) MCG/ACT inhaler  Commonly known as:  PROVENTIL HFA;VENTOLIN HFA  Inhale 2 puffs into the lungs every 6 (six) hours as needed for wheezing or shortness of breath.     allopurinol 100 MG tablet  Commonly known as:  ZYLOPRIM  Take 100 mg by  mouth daily.     diltiazem 360 MG 24 hr capsule  Commonly known as:  TIAZAC  Take 1 capsule (360 mg total) by mouth daily.     dorzolamide-timolol 22.3-6.8 MG/ML ophthalmic solution  Commonly known as:  COSOPT  Place 1 drop into the left eye at bedtime.     furosemide 40 MG tablet  Commonly known as:  LASIX  Take 40 mg by mouth daily.     lisinopril 20 MG tablet  Commonly known as:  PRINIVIL,ZESTRIL  Take 20 mg by mouth daily.     meclizine 25 MG tablet   Commonly known as:  ANTIVERT  Take 25 mg by mouth 3 (three) times daily as needed. Dizziness     pravastatin 80 MG tablet  Commonly known as:  PRAVACHOL  Take 80 mg by mouth at bedtime.     ranitidine 150 MG tablet  Commonly known as:  ZANTAC  Take 150 mg by mouth at bedtime.     warfarin 5 MG tablet  Commonly known as:  COUMADIN  Take 1 tablet (5 mg total) by mouth daily.       No Known Allergies Follow-up Information   Follow up with Leonides Grills, MD In 1 week. (hospital follow up)    Specialty:  Family Medicine   Contact information:   [id number] [place] [address] [phone]        The results of significant diagnostics from this hospitalization (including imaging, microbiology, ancillary and laboratory) are listed below for reference.    Significant Diagnostic Studies: Dg Chest 1 View  08/19/2013   CLINICAL DATA:  Left-sided weakness.  EXAM: CHEST - 1 VIEW  COMPARISON:  Chest x-ray 03/29/2013.  FINDINGS: Stable cardiomegaly. Cardiac pacer noted with lead tips over the right atrium and right ventricle. No pulmonary venous distention. No pleural effusion. Pleural parenchymal scarring right lung base. This finding is stable. No pleural effusion or pneumothorax. No acute osseous abnormality.  IMPRESSION: 1. Stable cardiomegaly, no CHF.  Cardiac pacer noted. 2. Stable pleural parenchymal thickening right lung base . No acute cardiopulmonary disease.   Electronically Signed   By: Marcello Moores  Register   On: 08/19/2013 23:36   Ct Head Wo Contrast  08/19/2013   CLINICAL DATA:  Stroke.  EXAM: CT HEAD WITHOUT CONTRAST  TECHNIQUE: Contiguous axial images were obtained from the base of the skull through the vertex without intravenous contrast.  COMPARISON:  CT HEAD W/O CM dated 11/03/2008  FINDINGS: No mass. No hydrocephalus. No hemorrhage. Subcortical and deep white matter changes consistent chronic ischemia. Old basal ganglia lacunar infarcts. Orbits  are unremarkable. Mucosal thickening right maxillary sinus consistent with sinusitis. No acute bony abnormality.  IMPRESSION: Stable white matter changes consistent with chronic ischemia. Old lacunar infarctions. No acute abnormality. These results were called by telephone at the time of interpretation on 08/19/2013 at 11:26 PM to Dr. Shanon Rosser , who verbally acknowledged these results.   Electronically Signed   By: Marcello Moores  Register   On: 08/19/2013 23:27   Ct Angio Neck W/cm &/or Wo/cm  08/20/2013   CLINICAL DATA:  80 year old female with left side weakness. Code stroke on 08/19/2013. Initial encounter. Atrial fibrillation.  EXAM: CT ANGIOGRAPHY HEAD AND NECK  TECHNIQUE: Multidetector CT imaging of the head and neck was performed using the standard protocol during bolus administration of intravenous contrast. Multiplanar CT image reconstructions and MIPs were obtained to evaluate the vascular anatomy. Carotid stenosis measurements (when applicable) are  obtained utilizing NASCET criteria, using the distal internal carotid diameter as the denominator.  CONTRAST:  50 mL Omnipaque 350.  COMPARISON:  Noncontrast head CTs 08/19/2013 and earlier. Brain MRI 10/04/2008.  FINDINGS: CTA HEAD FINDINGS  No acute osseous abnormality identified. Visualized scalp soft tissues are within normal limits.  No ventriculomegaly. No midline shift, mass effect, or evidence of intracranial mass lesion. Numerous chronic lacunar infarcts in the deep gray matter nuclei and also the cerebellum (greater on the left). Patchy and confluent cerebral white matter hypodensity is stable. No evidence of cortically based acute infarction identified. No acute intracranial hemorrhage identified. No abnormal enhancement identified.  VASCULAR FINDINGS:  Dominant distal left vertebral artery with mild to moderate calcified plaque in the V4 segment proximal to the patent left PICA origin. Stenosis here is mild to moderate. Normal left PICA origin.   Calcified plaque in the non dominant right V4 segment just proximal to the patent right PICA origin. The right vertebral artery functionally terminates in PICA.  The basilar artery is patent without stenosis. AICA and SCA origins are patent. Fetal type PCA origins, more so the right. Normal bilateral PCA branches. Normal posterior communicating arteries.  Heavy calcified plaque in both ICA siphons, maximal in the cavernous and supra clinoid segments. Still, no hemodynamically significant stenosis occurs on the right. There is stenosis of approximately 50 % with respect to the distal vessel in the distal left cavernous segment (series 6, image 103). Normal ophthalmic and posterior communicating artery origins. Normal carotid termini.  Normal MCA and ACA origins. Diminutive or absent anterior communicating artery. Bilateral ACA branches are within normal limits. Left MCA branches are within normal limits. Right MCA M1 segment is within normal limits. Right MCA bifurcation is patent. Right MCA branches are within normal limits.  Review of the MIP images confirms the above findings.  CTA NECK FINDINGS  Left chest cardiac pacemaker device. Negative lung parenchyma. Mediastinal lipomatosis. No superior mediastinal lymphadenopathy. Small bilateral low-density thyroid nodules, the largest 9 mm. No followup indicated in this age group. Negative larynx, pharynx, parapharyngeal spaces, sublingual space, orbits soft tissues (dysconjugate gaze, postoperative changes to the right globe), and parotid glands. Negative retropharyngeal space except for are retropharyngeal course of the carotids. Submandibular glands are within normal limits. No cervical lymphadenopathy. Chronic right maxillary sinusitis with mucoperiosteal thickening and partially calcified internal contents. Osteopenia. Multilevel degenerative changes in the cervical spine. No acute osseous abnormality identified.  VASCULAR FINDINGS: Extensive calcified and soft  plaque at the aortic arch. Three vessel arch configuration. Despite the atherosclerosis, no great vessel origin stenosis.  No right CCA origin stenosis. Intermittent soft and calcified plaque in the right CCA. Bulky calcified plaque at the right carotid bifurcation mostly affecting the ECA origin (coronal series 8079, image 112). No left ICA origin stenosis. Calcified plaque in the left ICA bulb resulting in less than 50 % with respect to the distal vessel. Focal severe tortuosity of the right ICA just be on the bulb resulting in a kinked appearance at 2 levels (coronal images 105 and 113). Otherwise negative cervical right ICA.  No proximal right subclavian artery stenosis. Heavily calcified right subclavian artery in the area of the right vertebral origin. Calcified plaque in the proximal right vertebral. Up to moderate stenosis at the origin suspected (coronal image 147). The right vertebral artery is non dominant and intermittently tortuous. No other or right vertebral artery stenosis in the neck or at the skullbase.  Patent left CCA origin without stenosis. Heavy calcification  at the left carotid bifurcation mostly affecting the ECA origin (coronal image 120). No left ICA origin stenosis. Mild calcified plaque in the left ICA bulb without stenosis. Tortuous left ICA with a mildly kinked appearance (sagittal series 80710, image 105). Otherwise negative cervical left ICA.  Calcified plaque in the proximal left subclavian artery, bulky in areas (series 6, image 27), but without hemodynamically significant stenosis identified. Headache calcification at the left vertebral artery origin which is patent with up to only mild stenosis. The left vertebral artery is mildly dominant. The V2 segment is intermittently tortuous. No other cervical left vertebral stenosis.  Review of the MIP images confirms the above findings.  IMPRESSION: 1. Extensive calcified plaque in the aortic arch and neck. Bilateral ECA origins most  affected. No hemodynamically significant carotid stenosis in the neck. Moderate stenosis at the origin of the nondominant right vertebral artery. 2. Extensive calcified plaque of both ICA siphons. Approximately 50% stenosis of the cavernous left ICA segment. Distal vertebral artery plaque resulting in moderate stenosis. 3. Otherwise negative intracranial CTA. 4. Extensive chronic small vessel disease. No acute intracranial abnormality identified.   Electronically Signed   By: Lars Pinks M.D.   On: 08/20/2013 13:27    Microbiology: No results found for this or any previous visit (from the past 240 hour(s)).   Labs: Basic Metabolic Panel:  Recent Labs Lab 08/19/13 2335 08/20/13 0650  NA 141 142  K 4.1 4.6  CL 103 107  CO2 24 22  GLUCOSE 146* 105*  BUN 17 14  CREATININE 1.07 0.94  CALCIUM 10.4 10.0   Liver Function Tests:  Recent Labs Lab 08/19/13 2335 08/20/13 0650  AST 19 19  ALT 11 10  ALKPHOS 101 80  BILITOT 0.3 0.3  PROT 7.9 6.6  ALBUMIN 4.0 3.4*   No results found for this basename: LIPASE, AMYLASE,  in the last 168 hours No results found for this basename: AMMONIA,  in the last 168 hours CBC:  Recent Labs Lab 08/19/13 2335 08/20/13 0650  WBC 8.3 7.7  NEUTROABS 6.1 4.4  HGB 14.1 13.0  HCT 43.1 39.0  MCV 92.1 91.8  PLT 196 168   Cardiac Enzymes: No results found for this basename: CKTOTAL, CKMB, CKMBINDEX, TROPONINI,  in the last 168 hours BNP: BNP (last 3 results)  Recent Labs  03/18/13 1652 03/29/13 2111  PROBNP 1435.0* 1458.0*   CBG:  Recent Labs Lab 08/20/13 0736 08/20/13 1209 08/20/13 1645 08/20/13 2048 08/21/13 0808  GLUCAP 100* 92 84 91 98       Signed:  FELIZ ORTIZ, ABRAHAM  [facility] 08/21/2013, 10:37 AM

## 2013-08-21 NOTE — Progress Notes (Signed)
Stroke Team Progress Note  HISTORY [name] is an 80 y.o. female with a past medical history significant for HTN, hyperlipidemia, atrial fibrillation on coumadin, tachy-brady syndrome, s/p pacemaker placement,rheumatic mitral valve disease , stroke, initially evaluated at [facility] as a code stroke due to acute onset left leg weakness. Dr. Aram Beecham spoke over the phone with the ED attending and decided against thrombolysis because of very mild deficit. She was transferred to [facility] for further management.   [name] expressed that she came back home from church 3/16 and went to sleep around 1 pm. Then woke up around 8 pm and noticed that her left leg " went to asleep, was weak, and I couldn't walk well ". Denies similar symptoms in her left arm or right side, no vertigo, double vision, slurred speech, language or vision impairment. She said that she did not have the chance to take her coumadin last night. INR 1.68 CT brain was performed at [facility] and showed no acute abnormality. NIHSS 2 here at [facility]. Last know well 08/19/13 at 1 pm   SUBJECTIVE Her husband is at the bedside.    OBJECTIVE Most recent Vital Signs: Filed Vitals:   08/20/13 1800 08/20/13 2000 08/21/13 0006 08/21/13 0349  BP: 143/66 166/73 139/49 166/69  Pulse: 81 80 79 79  Temp: 98 F (36.7 C) 97.7 F (36.5 C) 98.2 F (36.8 C) 98.4 F (36.9 C)  TempSrc: Oral Oral Oral Oral  Resp: 18 18 18 18   Height:      Weight:      SpO2: 95% 100% 96% 100%   CBG (last 3)   Recent Labs  08/20/13 1645 08/20/13 2048 08/21/13 0808  GLUCAP 84 91 98    IV Fluid Intake:     MEDICATIONS  . allopurinol  100 mg Oral Daily  . dorzolamide-timolol  1 drop Left Eye QHS  . famotidine  20 mg Oral BID  . heparin  5,000 Units Subcutaneous 3 times per day  . simvastatin  40 mg Oral q1800  . Warfarin - Pharmacist Dosing Inpatient   Does not apply q1800   PRN:  albuterol, meclizine  Diet:  Carb Control  Activity:   Bathroom privileges  with assistance DVT Prophylaxis:  Heparin 5000 units sq tid   CLINICALLY SIGNIFICANT STUDIES Basic Metabolic Panel:   Recent Labs Lab 08/19/13 2335 08/20/13 0650  NA 141 142  K 4.1 4.6  CL 103 107  CO2 24 22  GLUCOSE 146* 105*  BUN 17 14  CREATININE 1.07 0.94  CALCIUM 10.4 10.0   Liver Function Tests:   Recent Labs Lab 08/19/13 2335 08/20/13 0650  AST 19 19  ALT 11 10  ALKPHOS 101 80  BILITOT 0.3 0.3  PROT 7.9 6.6  ALBUMIN 4.0 3.4*   CBC:   Recent Labs Lab 08/19/13 2335 08/20/13 0650  WBC 8.3 7.7  NEUTROABS 6.1 4.4  HGB 14.1 13.0  HCT 43.1 39.0  MCV 92.1 91.8  PLT 196 168   Coagulation:   Recent Labs Lab 08/19/13 2335 08/21/13 0450  LABPROT 19.3* 18.0*  INR 1.68* 1.53*   Cardiac Enzymes: No results found for this basename: CKTOTAL, CKMB, CKMBINDEX, TROPONINI,  in the last 168 hours Urinalysis:   Recent Labs Lab 08/20/13 0006  COLORURINE YELLOW  LABSPEC >1.030*  PHURINE 5.5  GLUCOSEU NEGATIVE  HGBUR TRACE*  BILIRUBINUR NEGATIVE  KETONESUR NEGATIVE  PROTEINUR NEGATIVE  UROBILINOGEN 0.2  NITRITE NEGATIVE  LEUKOCYTESUR NEGATIVE   Lipid Panel  Component Value Date/Time   CHOL 183 08/21/2013 0450   TRIG 120 08/21/2013 0450   HDL 56 08/21/2013 0450   CHOLHDL 3.3 08/21/2013 0450   VLDL 24 08/21/2013 0450   LDLCALC 103* 08/21/2013 0450   HgbA1C  Lab Results  Component Value Date   HGBA1C 6.4* 08/20/2013    Urine Drug Screen:     Component Value Date/Time   LABOPIA NONE DETECTED 08/20/2013 0006   COCAINSCRNUR NONE DETECTED 08/20/2013 0006   LABBENZ NONE DETECTED 08/20/2013 0006   AMPHETMU NONE DETECTED 08/20/2013 0006   THCU NONE DETECTED 08/20/2013 0006   LABBARB NONE DETECTED 08/20/2013 0006    Alcohol Level:   Recent Labs Lab 08/19/13 2335  ETH <11    CT of the brain  08/19/2013   Stable white matter changes consistent with chronic ischemia. Old lacunar infarctions. No acute abnormality.   Ct Angio Neck   08/20/2013   1. Extensive  calcified plaque in the aortic arch and neck. Bilateral ECA origins most affected. No hemodynamically significant carotid stenosis in the neck. Moderate stenosis at the origin of the nondominant right vertebral artery. 2. Extensive calcified plaque of both ICA siphons. Approximately 50% stenosis of the cavernous left ICA segment. Distal vertebral artery plaque resulting in moderate stenosis. 3. Otherwise negative intracranial CTA. 4. Extensive chronic small vessel disease. No acute intracranial abnormality identified.     2D Echocardiogram  EF 65-70% with no source of embolus.   Carotid Doppler  Findings suggest 1-39% internal carotid artery stenosis bilaterally. Unable to visualize bilateral vertebral arteries.  CXR  08/19/2013    1. Stable cardiomegaly, no CHF.  Cardiac pacer noted. 2. Stable pleural parenchymal thickening right lung base . No acute cardiopulmonary disease.     EKG  atrial fibrillation, rate 72. For complete results please see formal report.   Therapy Recommendations OP PT  Physical Exam   Pleasant elderly lady not in distress.Awake alert. Afebrile. Head is nontraumatic. Neck is supple without bruit. Hearing is normal. Cardiac exam no murmur or gallop. Lungs are clear to auscultation. Distal pulses are well felt. Neurological Exam ;  Awake  Alert oriented x 3. Normal speech and language.eye movements full without nystagmus.fundi were not visualized. Vision acuity and fields appear normal. Hearing is normal. Palatal movements are normal. Face symmetric. Tongue midline. Normal strength, tone, reflexes and coordination. Normal sensation. Gait deferred.  ASSESSMENT [name] is a 80 y.o. female presenting with left leg weakness. Dx:  right brain infarct not seen on CT. I most likely embolic secondary to known atrial fibrillation.  On warfarin prior to admission, but missed a dose on Sunday. Now on warfarin for secondary stroke prevention. Stroke work up  completed.  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 Follow up as on OP with cardiology to see if transition to NOAC is appropriate.  OP PT No further stroke workup indicated. Patient has a 10-15% risk of having another stroke over the next year, the highest risk is within 2 weeks of the most recent stroke/TIA (risk of having a stroke following a stroke  or TIA is the same). Ongoing risk factor control by Primary Care Physician Stroke Service will sign off. Please call should any needs arise. Follow up with Dr. Leonie Man, [facility], in 2 months.  Burnetta Sabin, MSN, RN, ANVP-BC, AGPCNP-BC [facility] Pager: [phone] 08/21/2013 9:57 AM  I have personally obtained a history, examined the patient, evaluated imaging results, and formulated the assessment and plan of care. I agree with the above.  Antony Contras, MD  To contact Stroke Continuity provider, please refer to [email or web address] After hours, contact General Neurology

## 2013-08-21 NOTE — Progress Notes (Signed)
Physical Therapy Treatment Patient Details Name: [name] MRN: [id number] DOB: 03-Aug-1926 Today's Date: 08/21/2013 Time: [id number] PT Time Calculation (min): 16 min  PT Assessment / Plan / Recommendation  History of Present Illness 80 y.o. female admitted to [facility] on 08/19/13 with Past medical history of hypertension, atrial fibrillation, mitral or disease, tachybradycardia syndrome status post pacemaker implant, dyslipidemia, CVA (~6 years ago affecting her left side and her vision in her left eye), gout, R TKA.  She presents with sudden onset left leg weakness and numbness.  Initial CT negative for acute infarct.  MRI not ordered due to pacemaker.  Repeat CT report not in chart yet.     PT Comments   Pt overall safe to d/c home with husband.  Recommend OPPT however pt strongly against OPPT at this time stating "I've always been this way" and "I don't need any therapy."  Follow Up Recommendations  Outpatient PT;Supervision - Intermittent     Does the patient have the potential to tolerate intense rehabilitation     Barriers to Discharge        Equipment Recommendations  None recommended by PT    Recommendations for Other Services    Frequency Min 4X/week   Progress towards PT Goals Progress towards PT goals: Progressing toward goals  Plan Current plan remains appropriate    Precautions / Restrictions Precautions Precautions: Fall Restrictions Weight Bearing Restrictions: No   Pertinent Vitals/Pain No c/o    Mobility  Bed Mobility Overal bed mobility: Independent General bed mobility comments: bed flat, no rails Transfers Overall transfer level: Needs assistance Equipment used: None Transfers: Sit to/from Stand Sit to Stand: Supervision Ambulation/Gait Ambulation/Gait assistance: Min guard Ambulation Distance (Feet): 140 Feet Assistive device: None Gait Pattern/deviations:  (increased lateral sway bil.) Gait velocity: decreased, cautious General Gait Details:  pt. reaching for UE support with amb in hallways; no significant LOB noted however increased lateral sway Modified Rankin (Stroke Patients Only) Pre-Morbid Rankin Score: Slight disability Modified Rankin: Moderate disability    Exercises     PT Diagnosis:    PT Problem List:   PT Treatment Interventions:     PT Goals (current goals can now be found in the care plan section) Acute Rehab PT Goals [name] PT Goal Formulation: With patient/family Time For Goal Achievement: 09/03/13 Potential to Achieve Goals: Good  Visit Information  Last PT Received On: 08/21/13 Assistance Needed: +1 History of Present Illness: 80 y.o. female admitted to [facility] on 08/19/13 with Past medical history of hypertension, atrial fibrillation, mitral or disease, tachybradycardia syndrome status post pacemaker implant, dyslipidemia, CVA (~6 years ago affecting her left side and her vision in her left eye), gout, R TKA.  She presents with sudden onset left leg weakness and numbness.  Initial CT negative for acute infarct.  MRI not ordered due to pacemaker.  Repeat CT report not in chart yet.      Subjective Data  Subjective: "I don't want to go to therapy.  I've always been this way." [name]   Cognition  Cognition Arousal/Alertness: Awake/alert Behavior During Therapy: WFL for tasks assessed/performed Overall Cognitive Status: Within Functional Limits for tasks assessed    Balance  Balance Overall balance assessment: Needs assistance Sitting-balance support: No upper extremity supported;Feet supported Sitting balance-Leahy Scale: Good Standing balance support: No upper extremity supported;During functional activity Standing balance-Leahy Scale: Fair High Level Balance Comments: pt. unable to amb on toes/heels or perform single limb stance  stating "I think I'll fall."  End of Session PT - End of Session Equipment Utilized During Treatment: Gait belt Activity  Tolerance: Patient tolerated treatment well Patient left: in chair;with family/visitor present (call bell near husband; pt. too far away per her request) Nurse Communication: Mobility status   GP Functional Assessment Tool Used: assist level Functional Limitation: Mobility: Walking and moving around Mobility: Walking and Moving Around Current Status [id number]): At least 20 percent but less than 40 percent impaired, limited or restricted Mobility: Walking and Moving Around Goal Status [phone]): At least 1 percent but less than 20 percent impaired, limited or restricted Mobility: Walking and Moving Around Discharge Status [id number]): At least 1 percent but less than 20 percent impaired, limited or restricted   Siri Cole 08/21/2013, 10:10 AM  Laureen Abrahams, PT, DPT [phone]

## 2013-08-21 NOTE — Discharge Instructions (Signed)
STROKE/TIA DISCHARGE INSTRUCTIONS SMOKING Cigarette smoking nearly doubles your risk of having a stroke & is the single most alterable risk factor  If you smoke or have smoked in the last 12 months, you are advised to quit smoking for your health.  Most of the excess cardiovascular risk related to smoking disappears within a year of stopping.  Ask you doctor about anti-smoking medications  [place] Quit Line: 1-800-QUIT NOW  Free Smoking Cessation Classes [phone]  CHOLESTEROL Know your levels; limit fat & cholesterol in your diet  Lipid Panel     Component Value Date/Time   CHOL 183 08/21/2013 0450   TRIG 120 08/21/2013 0450   HDL 56 08/21/2013 0450   CHOLHDL 3.3 08/21/2013 0450   VLDL 24 08/21/2013 0450   LDLCALC 103* 08/21/2013 0450      Many patients benefit from treatment even if their cholesterol is at goal.  Goal: Total Cholesterol (CHOL) less than 160  Goal:  Triglycerides (TRIG) less than 150  Goal:  HDL greater than 40  Goal:  LDL (LDLCALC) less than 100   BLOOD PRESSURE American Stroke Association blood pressure target is less that 120/80 mm/Hg  Your discharge blood pressure is:  BP: 166/69 mmHg  Monitor your blood pressure  Limit your salt and alcohol intake  Many individuals will require more than one medication for high blood pressure  DIABETES (A1c is a blood sugar average for last 3 months) Goal HGBA1c is under 7% (HBGA1c is blood sugar average for last 3 months)  Diabetes: Diagnosis of diabetes:  Your A1c:6.4 %    Lab Results  Component Value Date   HGBA1C 6.4* 08/20/2013     Your HGBA1c can be lowered with medications, healthy diet, and exercise.  Check your blood sugar as directed by your physician  Call your physician if you experience unexplained or low blood sugars.  PHYSICAL ACTIVITY/REHABILITATION Goal is 30 minutes at least 4 days per week  Activity: Increase activity slowly, Therapies: Physical Therapy: Outpatient and Occupational Therapy:  Outpatient Return to work:   Activity decreases your risk of heart attack and stroke and makes your heart stronger.  It helps control your weight and blood pressure; helps you relax and can improve your mood.  Participate in a regular exercise program.  Talk with your doctor about the best form of exercise for you (dancing, walking, swimming, cycling).  DIET/WEIGHT Goal is to maintain a healthy weight  Your discharge diet is: Carb Control  Your height is:  Height: 5\' 1"  (154.9 cm) Your current weight is: Weight: 73.029 kg (161 lb) Your Body Mass Index (BMI) is:  BMI (Calculated): 30.5  Following the type of diet specifically designed for you will help prevent another stroke.  Your goal weight range is:    Your goal Body Mass Index (BMI) is 19-24.  Healthy food habits can help reduce 3 risk factors for stroke:  High cholesterol, hypertension, and excess weight.  RESOURCES Stroke/Support Group:  Call [phone]   STROKE EDUCATION PROVIDED/REVIEWED AND GIVEN TO PATIENT Stroke warning signs and symptoms How to activate emergency medical system (call 911). Medications prescribed at discharge. Need for follow-up after discharge. Personal risk factors for stroke. Pneumonia vaccine given: No Flu vaccine given: No My questions have been answered, the writing is legible, and I understand these instructions.  I will adhere to these goals & educational materials that have been provided to me after my discharge from the hospital.

## 2013-08-23 ENCOUNTER — Telehealth (HOSPITAL_COMMUNITY): Payer: Self-pay

## 2013-08-24 ENCOUNTER — Encounter: Payer: Self-pay | Admitting: Internal Medicine

## 2013-08-24 ENCOUNTER — Ambulatory Visit (INDEPENDENT_AMBULATORY_CARE_PROVIDER_SITE_OTHER): Payer: Medicare Other | Admitting: Internal Medicine

## 2013-08-24 ENCOUNTER — Ambulatory Visit (INDEPENDENT_AMBULATORY_CARE_PROVIDER_SITE_OTHER): Payer: Medicare Other | Admitting: *Deleted

## 2013-08-24 ENCOUNTER — Ambulatory Visit (HOSPITAL_COMMUNITY): Payer: Medicare Other | Admitting: Physical Therapy

## 2013-08-24 VITALS — BP 155/78 | HR 81 | Ht 61.0 in | Wt 185.0 lb

## 2013-08-24 DIAGNOSIS — Z950 Presence of cardiac pacemaker: Secondary | ICD-10-CM

## 2013-08-24 DIAGNOSIS — I6350 Cerebral infarction due to unspecified occlusion or stenosis of unspecified cerebral artery: Secondary | ICD-10-CM

## 2013-08-24 DIAGNOSIS — R55 Syncope and collapse: Secondary | ICD-10-CM

## 2013-08-24 DIAGNOSIS — I495 Sick sinus syndrome: Secondary | ICD-10-CM

## 2013-08-24 DIAGNOSIS — R079 Chest pain, unspecified: Secondary | ICD-10-CM

## 2013-08-24 DIAGNOSIS — Z5181 Encounter for therapeutic drug level monitoring: Secondary | ICD-10-CM

## 2013-08-24 DIAGNOSIS — I639 Cerebral infarction, unspecified: Secondary | ICD-10-CM

## 2013-08-24 DIAGNOSIS — I4891 Unspecified atrial fibrillation: Secondary | ICD-10-CM

## 2013-08-24 DIAGNOSIS — I10 Essential (primary) hypertension: Secondary | ICD-10-CM

## 2013-08-24 LAB — MDC_IDC_ENUM_SESS_TYPE_INCLINIC
Brady Statistic RA Percent Paced: 0 %
Brady Statistic RV Percent Paced: 48 %
Date Time Interrogation Session: 20150320102324
Implantable Pulse Generator Model: 2210
Implantable Pulse Generator Serial Number: 2323973
Lead Channel Impedance Value: 462.5 Ohm
Lead Channel Pacing Threshold Pulse Width: 0.4 ms
Lead Channel Pacing Threshold Pulse Width: 0.4 ms
Lead Channel Sensing Intrinsic Amplitude: 12 mV
Lead Channel Setting Pacing Amplitude: 2.5 V
Lead Channel Setting Sensing Sensitivity: 2 mV
MDC IDC MSMT BATTERY REMAINING LONGEVITY: 96 mo
MDC IDC MSMT BATTERY VOLTAGE: 2.95 V
MDC IDC MSMT LEADCHNL RA PACING THRESHOLD AMPLITUDE: 1.125 V
MDC IDC MSMT LEADCHNL RA SENSING INTR AMPL: 5 mV
MDC IDC MSMT LEADCHNL RV PACING THRESHOLD AMPLITUDE: 1 V
MDC IDC SET LEADCHNL RV PACING PULSEWIDTH: 0.4 ms

## 2013-08-24 LAB — POCT INR: INR: 2.8

## 2013-08-24 NOTE — Assessment & Plan Note (Signed)
Her blood pressure is controlled. She will continue her current meds.  

## 2013-08-24 NOTE — Progress Notes (Signed)
HPI  [name] returns today for followup. She is a very pleasant 80 year old woman with a history of now chronic atrial fibrillation, symptomatic bradycardia, remote strokes including one a few days ago when she missed a dose of warfarin and ate some greens.  In the interim, she has been stable. She denies peripheral edema, cough, or chest pain. No syncope. She has dyspnea with exertion. No Known Allergies   Current Outpatient Prescriptions  Medication Sig Dispense Refill  . albuterol (PROVENTIL HFA;VENTOLIN HFA) 108 (90 BASE) MCG/ACT inhaler Inhale 2 puffs into the lungs every 6 (six) hours as needed for wheezing or shortness of breath.      . allopurinol (ZYLOPRIM) 100 MG tablet Take 100 mg by mouth daily.        Marland Kitchen diltiazem (TIAZAC) 360 MG 24 hr capsule Take 1 capsule (360 mg total) by mouth daily.  30 capsule  6  . dorzolamide-timolol (COSOPT) 22.3-6.8 MG/ML ophthalmic solution Place 1 drop into the left eye at bedtime.       . furosemide (LASIX) 40 MG tablet Take 40 mg by mouth daily.      Marland Kitchen lisinopril (PRINIVIL,ZESTRIL) 20 MG tablet Take 20 mg by mouth daily.      . meclizine (ANTIVERT) 25 MG tablet Take 25 mg by mouth 3 (three) times daily as needed. Dizziness      . pravastatin (PRAVACHOL) 80 MG tablet Take 80 mg by mouth at bedtime.      . ranitidine (ZANTAC) 150 MG tablet Take 150 mg by mouth at bedtime.       Marland Kitchen warfarin (COUMADIN) 5 MG tablet Take 1 tablet (5 mg total) by mouth daily.  30 tablet  0  . [DISCONTINUED] colchicine 0.6 MG tablet Take 0.6 mg by mouth daily as needed. Gout Flare Ups      . [DISCONTINUED] diltiazem (CARDIZEM CD) 360 MG 24 hr capsule TAKE (1) CAPSULE BY MOUTH ONCE DAILY.  30 capsule  5  . [DISCONTINUED] simvastatin (ZOCOR) 40 MG tablet Take 40 mg by mouth at bedtime.         No current facility-administered medications for this visit.     Past Medical History  Diagnosis Date  . Tachycardia-bradycardia syndrome     with pauses and syncope;PPM  implantation 10/2008,negative stress nuclear study 03/2005  . Atrial fibrillation     persistant  . Mitral valve disease     Severe mitral annular calcification; mild to moderate stenosis-not clearly rheumatic  . Hypertension     heart disease diastolic dysfunction ; [id number]; mild pulmonary edema in 2006;responded to diurectics ; LVH  . Chronic renal insufficiency     baseline creatine 1.4; 1.04 in 03/2010  . Hyperlipidemia   . Angioedema 08/2006  . Anemia     H/H of 11.3/36.7 in 12/2008 ;normal MCV; normal CBC in 2011  . Gout   . 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 Vertigo   . Shortness of breath   . Chronic bronchitis   . Blood transfusion   . DJD (degenerative joint disease)     hands, knees  . Seizure disorder   . Nephrolithiasis   . Stroke   . Retinal hemorrhage     ROS:   All systems reviewed and negative except as noted in the HPI.   Past  Surgical History  Procedure Laterality Date  . Insert / replace / remove pacemaker  11/2008    St.Jude DUAL chamber pacemaker 11/06/2008  . Total knee arthroplasty  04/2009    Right  . Cataract extraction w/phaco  03/29/2011    Procedure: CATARACT EXTRACTION PHACO AND INTRAOCULAR LENS PLACEMENT (IOC);  Surgeon: Tonny Branch;  Location: [facility];  Service: Ophthalmology;  Laterality: Right;  CDE 12.62     Family History  Problem Relation Age of Onset  . Hypotension Neg Hx   . Anesthesia problems Neg Hx   . Pseudochol deficiency Neg Hx   . Malignant hyperthermia Neg Hx   . Cancer Other     unknown cancer     History   Social History  . Marital Status: Married    Spouse Name: N/A    Number of Children: N/A  . Years of Education: N/A   Occupational History  . Not on file.   Social History Main Topics  . Smoking  status: Never Smoker   . Smokeless tobacco: Never Used  . Alcohol Use: No  . Drug Use: No  . Sexual Activity: Not on file   Other Topics Concern  . Not on file   Social History Narrative  . No narrative on file     BP 155/78  Pulse 81  Ht 5\' 1"  (1.549 m)  Wt 185 lb (83.915 kg)  BMI 34.97 kg/m2  SpO2 100%  Physical Exam:  Well appearing 80 year old woman, obese,NAD HEENT: Unremarkable Neck:  7 cm JVD, no thyromegally Lungs:  Clear with no wheezes, rales, or rhonchi. HEART:  Regular rate rhythm, no murmurs, no rubs, no clicks Abd:  soft, positive bowel sounds, no organomegally, no rebound, no guarding Ext:  2 plus pulses, no edema, no cyanosis, no clubbing Skin:  No rashes no nodules Neuro:  CN II through XII intact, motor grossly intact except for very minimal right HP.  DEVICE  Normal device function.  See PaceArt for details.   Assess/Plan:

## 2013-08-24 NOTE — Patient Instructions (Signed)
Your physician recommends that you schedule a follow-up appointment in: 12 months with Dr Knox Saliva will receive a reminder letter two months in advance reminding you to call and schedule your appointment. If you don't receive this letter, please contact our office.  Your physician recommends that you continue on your current medications as directed. Please refer to the Current Medication list given to you today.

## 2013-08-24 NOTE — Assessment & Plan Note (Signed)
We discussed the importance of medical and dietary compliance. I considered switching her to a NOAC but she states she cannot afford anything that is not generic. Will follow. I discussed the importance of compliance with diet and coumadin.

## 2013-08-24 NOTE — Assessment & Plan Note (Signed)
Her St. Jude DDD PM is working normally. Will recheck in several months.  

## 2013-09-05 ENCOUNTER — Telehealth: Payer: Self-pay | Admitting: Internal Medicine

## 2013-09-05 NOTE — Telephone Encounter (Signed)
Kept appt for 4/6 and cancelled 4/20 appt.

## 2013-09-07 ENCOUNTER — Other Ambulatory Visit: Payer: Self-pay | Admitting: Cardiology

## 2013-09-10 ENCOUNTER — Ambulatory Visit (INDEPENDENT_AMBULATORY_CARE_PROVIDER_SITE_OTHER): Payer: Medicare Other | Admitting: *Deleted

## 2013-09-10 DIAGNOSIS — Z5181 Encounter for therapeutic drug level monitoring: Secondary | ICD-10-CM

## 2013-09-10 DIAGNOSIS — I6350 Cerebral infarction due to unspecified occlusion or stenosis of unspecified cerebral artery: Secondary | ICD-10-CM

## 2013-09-10 DIAGNOSIS — I639 Cerebral infarction, unspecified: Secondary | ICD-10-CM

## 2013-09-10 DIAGNOSIS — I495 Sick sinus syndrome: Secondary | ICD-10-CM

## 2013-09-10 LAB — POCT INR: INR: 3.5

## 2013-10-01 ENCOUNTER — Ambulatory Visit (INDEPENDENT_AMBULATORY_CARE_PROVIDER_SITE_OTHER): Payer: Medicare Other | Admitting: *Deleted

## 2013-10-01 DIAGNOSIS — Z5181 Encounter for therapeutic drug level monitoring: Secondary | ICD-10-CM

## 2013-10-01 DIAGNOSIS — I495 Sick sinus syndrome: Secondary | ICD-10-CM

## 2013-10-01 DIAGNOSIS — I639 Cerebral infarction, unspecified: Secondary | ICD-10-CM

## 2013-10-01 DIAGNOSIS — I6350 Cerebral infarction due to unspecified occlusion or stenosis of unspecified cerebral artery: Secondary | ICD-10-CM

## 2013-10-01 LAB — POCT INR: INR: 2.3

## 2013-11-01 ENCOUNTER — Telehealth: Payer: Self-pay | Admitting: *Deleted

## 2013-11-01 MED ORDER — WARFARIN SODIUM 5 MG PO TABS: ORAL_TABLET | ORAL | Status: DC

## 2013-11-01 NOTE — Telephone Encounter (Signed)
Received fax refill request  Rx # [id number] Medication:  Warfarin sodium 5 mg tablet Qty 30 Sig:  Take one tablet by mouth daily Physician:  Domenic Polite

## 2013-11-03 ENCOUNTER — Other Ambulatory Visit: Payer: Self-pay | Admitting: Adult Health

## 2013-11-05 NOTE — Telephone Encounter (Signed)
Pt needs warfarin called in

## 2013-11-14 ENCOUNTER — Ambulatory Visit (INDEPENDENT_AMBULATORY_CARE_PROVIDER_SITE_OTHER): Payer: Medicare Other | Admitting: *Deleted

## 2013-11-14 DIAGNOSIS — I6350 Cerebral infarction due to unspecified occlusion or stenosis of unspecified cerebral artery: Secondary | ICD-10-CM

## 2013-11-14 DIAGNOSIS — I495 Sick sinus syndrome: Secondary | ICD-10-CM

## 2013-11-14 DIAGNOSIS — Z5181 Encounter for therapeutic drug level monitoring: Secondary | ICD-10-CM

## 2013-11-14 DIAGNOSIS — I639 Cerebral infarction, unspecified: Secondary | ICD-10-CM

## 2013-11-14 LAB — POCT INR: INR: 2.2

## 2013-11-27 ENCOUNTER — Ambulatory Visit (INDEPENDENT_AMBULATORY_CARE_PROVIDER_SITE_OTHER): Payer: Medicare Other | Admitting: *Deleted

## 2013-11-27 DIAGNOSIS — I495 Sick sinus syndrome: Secondary | ICD-10-CM

## 2013-11-27 NOTE — Progress Notes (Signed)
Remote pacemaker transmission.   

## 2013-12-03 LAB — MDC_IDC_ENUM_SESS_TYPE_REMOTE
Brady Statistic RV Percent Paced: 54 %
Date Time Interrogation Session: 20150623071958
Lead Channel Sensing Intrinsic Amplitude: 12 mV
Lead Channel Setting Pacing Amplitude: 2.5 V
Lead Channel Setting Sensing Sensitivity: 2 mV
MDC IDC MSMT BATTERY REMAINING LONGEVITY: 96 mo
MDC IDC MSMT BATTERY REMAINING PERCENTAGE: 74 %
MDC IDC MSMT BATTERY VOLTAGE: 2.95 V
MDC IDC MSMT LEADCHNL RV IMPEDANCE VALUE: 460 Ohm
MDC IDC PG SERIAL: 2323973
MDC IDC SET LEADCHNL RV PACING PULSEWIDTH: 0.4 ms

## 2013-12-12 ENCOUNTER — Encounter: Payer: Self-pay | Admitting: Cardiology

## 2013-12-18 ENCOUNTER — Encounter: Payer: Self-pay | Admitting: Internal Medicine

## 2013-12-26 ENCOUNTER — Ambulatory Visit (INDEPENDENT_AMBULATORY_CARE_PROVIDER_SITE_OTHER): Payer: Medicare Other | Admitting: *Deleted

## 2013-12-26 DIAGNOSIS — I495 Sick sinus syndrome: Secondary | ICD-10-CM

## 2013-12-26 DIAGNOSIS — I639 Cerebral infarction, unspecified: Secondary | ICD-10-CM

## 2013-12-26 DIAGNOSIS — Z5181 Encounter for therapeutic drug level monitoring: Secondary | ICD-10-CM

## 2013-12-26 DIAGNOSIS — I6350 Cerebral infarction due to unspecified occlusion or stenosis of unspecified cerebral artery: Secondary | ICD-10-CM

## 2013-12-26 LAB — POCT INR: INR: 3

## 2014-01-02 ENCOUNTER — Other Ambulatory Visit: Payer: Self-pay | Admitting: Adult Health

## 2014-01-20 ENCOUNTER — Emergency Department (HOSPITAL_COMMUNITY)
Admission: EM | Admit: 2014-01-20 | Discharge: 2014-01-20 | Disposition: A | Discharge status: Home / Self Care | Payer: Medicare Other | Attending: Emergency Medicine | Admitting: Emergency Medicine

## 2014-01-20 ENCOUNTER — Encounter (HOSPITAL_COMMUNITY): Payer: Self-pay | Admitting: Emergency Medicine

## 2014-01-20 ENCOUNTER — Emergency Department (HOSPITAL_COMMUNITY): Payer: Medicare Other

## 2014-01-20 DIAGNOSIS — Z7901 Long term (current) use of anticoagulants: Secondary | ICD-10-CM | POA: Diagnosis not present

## 2014-01-20 DIAGNOSIS — Z8673 Personal history of transient ischemic attack (TIA), and cerebral infarction without residual deficits: Secondary | ICD-10-CM | POA: Insufficient documentation

## 2014-01-20 DIAGNOSIS — H538 Other visual disturbances: Secondary | ICD-10-CM | POA: Insufficient documentation

## 2014-01-20 DIAGNOSIS — H3560 Retinal hemorrhage, unspecified eye: Secondary | ICD-10-CM | POA: Insufficient documentation

## 2014-01-20 DIAGNOSIS — N189 Chronic kidney disease, unspecified: Secondary | ICD-10-CM | POA: Insufficient documentation

## 2014-01-20 DIAGNOSIS — H3561 Retinal hemorrhage, right eye: Secondary | ICD-10-CM

## 2014-01-20 DIAGNOSIS — I10 Essential (primary) hypertension: Secondary | ICD-10-CM | POA: Diagnosis not present

## 2014-01-20 DIAGNOSIS — H547 Unspecified visual loss: Secondary | ICD-10-CM | POA: Insufficient documentation

## 2014-01-20 DIAGNOSIS — H5461 Unqualified visual loss, right eye, normal vision left eye: Secondary | ICD-10-CM

## 2014-01-20 DIAGNOSIS — I4891 Unspecified atrial fibrillation: Secondary | ICD-10-CM | POA: Insufficient documentation

## 2014-01-20 LAB — COMPREHENSIVE METABOLIC PANEL
ALBUMIN: 4.1 g/dL (ref 3.5–5.2)
ALT: 12 U/L (ref 0–35)
AST: 20 U/L (ref 0–37)
Alkaline Phosphatase: 91 U/L (ref 39–117)
Anion gap: 13 (ref 5–15)
BUN: 21 mg/dL (ref 6–23)
CALCIUM: 10.2 mg/dL (ref 8.4–10.5)
CO2: 23 mEq/L (ref 19–32)
CREATININE: 1.26 mg/dL — AB (ref 0.50–1.10)
Chloride: 104 mEq/L (ref 96–112)
GFR calc Af Amer: 44 mL/min — ABNORMAL LOW (ref >90)
GFR, EST NON AFRICAN AMERICAN: 38 mL/min — AB (ref >90)
Glucose, Bld: 101 mg/dL — ABNORMAL HIGH (ref 70–99)
Potassium: 4.2 mEq/L (ref 3.7–5.3)
SODIUM: 140 meq/L (ref 137–147)
Total Bilirubin: 0.3 mg/dL (ref 0.3–1.2)
Total Protein: 7.4 g/dL (ref 6.0–8.3)

## 2014-01-20 LAB — CBC WITH DIFFERENTIAL/PLATELET
BASOS ABS: 0 10*3/uL (ref 0.0–0.1)
BASOS PCT: 0 % (ref 0–1)
EOS ABS: 0.1 10*3/uL (ref 0.0–0.7)
Eosinophils Relative: 1 % (ref 0–5)
HCT: 42.4 % (ref 36.0–46.0)
Hemoglobin: 13.7 g/dL (ref 12.0–15.0)
LYMPHS PCT: 28 % (ref 12–46)
Lymphs Abs: 2.1 10*3/uL (ref 0.7–4.0)
MCH: 29.4 pg (ref 26.0–34.0)
MCHC: 32.3 g/dL (ref 30.0–36.0)
MCV: 91 fL (ref 78.0–100.0)
MONO ABS: 0.7 10*3/uL (ref 0.1–1.0)
Monocytes Relative: 9 % (ref 3–12)
Neutro Abs: 4.7 10*3/uL (ref 1.7–7.7)
Neutrophils Relative %: 62 % (ref 43–77)
PLATELETS: 195 10*3/uL (ref 150–400)
RBC: 4.66 MIL/uL (ref 3.87–5.11)
RDW: 13.8 % (ref 11.5–15.5)
WBC: 7.6 10*3/uL (ref 4.0–10.5)

## 2014-01-20 LAB — PROTIME-INR
INR: 1.77 — ABNORMAL HIGH (ref 0.00–1.49)
PROTHROMBIN TIME: 20.6 s — AB (ref 11.6–15.2)

## 2014-01-20 LAB — TROPONIN I: Troponin I: <0.3 ng/mL (ref <0.30)

## 2014-01-20 LAB — APTT: APTT: 30 s (ref 24–37)

## 2014-01-20 MED ORDER — SODIUM CHLORIDE 0.9 % IV SOLN
INTRAVENOUS | Status: DC
  Administered 2014-01-20: 14:00:00 via INTRAVENOUS

## 2014-01-20 MED ORDER — TROPICAMIDE 1 % OP SOLN
2.0000 [drp] | Freq: Once | OPHTHALMIC | Status: AC
  Administered 2014-01-20: 2 [drp] via OPHTHALMIC
  Filled 2014-01-20: qty 3

## 2014-01-20 NOTE — ED Notes (Signed)
MD at bedside. 

## 2014-01-20 NOTE — ED Notes (Signed)
Dr Knapp at bedside,  

## 2014-01-20 NOTE — ED Provider Notes (Signed)
I saw and evaluated the patient, reviewed the resident's note and I agree with the findings and plan.   EKG Interpretation   Date/Time:  Sunday January 20 2014 12:59:54 EDT Ventricular Rate:  71 PR Interval:    QRS Duration: 68 QT Interval:  402 QTC Calculation: 436 R Axis:   -12 Text Interpretation:  Atrial fibrillation with occasional  ventricular-paced complexes Septal infarct , age undetermined Low voltage  QRS No significant change since last tracing 20 Aug 2013 Confirmed by  [facility]  MD-I, IVA ([id number]) on 01/20/2014 1:39:32 PM     Patient transferred from outside hospital. Vision changes. Has vitreal hemorrhage. Not intracranial cause of vision change. Seen in the ER by Dr. Lucita Ferrara. Follow with retina  Jasper Riling. Alvino Chapel, MD 01/20/14 2330

## 2014-01-20 NOTE — ED Provider Notes (Signed)
CSN: [id number]     Arrival date & time 01/20/14  1227 History   First MD Initiated Contact with Patient 01/20/14 1250     Chief Complaint  Patient presents with  . Blurred Vision     (Consider location/radiation/quality/duration/timing/severity/associated sxs/prior Treatment) HPI Pt reports she was at church today singing and had acute onset of seeing blood or red in her right visual field for the first few minutes followed by looking through a smoky haze and is seeing dark discs floating in her right visual field. She denies headache. She denies numbness or tingling in her extremities, chest pain, shortness of breath, difficulty walking, difficulty speaking. She states she had something similar with her left eye when she had a stroke. She however also had a retinal hemorrhage in the left eye shortly after she had the stroke that was treated. Patient is on Coumadin and states her INR reached last checked about 3 weeks ago and was okay.  PCP Dr Orson [facility] [place] Opthalm[facility]  Dr Eulas Post in [place]   Past Medical History  Diagnosis Date  . Tachycardia-bradycardia syndrome     with pauses and syncope;PPM implantation 10/2008,negative stress nuclear study 03/2005  . Atrial fibrillation     persistant  . Mitral valve disease     Severe mitral annular calcification; mild to moderate stenosis-not clearly rheumatic  . Hypertension     heart disease diastolic dysfunction ; [id number]; mild pulmonary edema in 2006;responded to diurectics ; [facility]  . Chronic renal insufficiency     baseline creatine 1.4; 1.04 in 03/2010  . Hyperlipidemia   . Angioedema 08/2006  . Anemia     H/H of 11.3/36.7 in 12/2008 ;normal MCV; normal CBC in 2011  . Gout   . 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  Vertigo   . Shortness of breath   . Chronic bronchitis   . Blood transfusion   . DJD (degenerative joint disease)     hands, knees  . Seizure disorder   . Nephrolithiasis   . Stroke   . Retinal hemorrhage    Past Surgical History  Procedure Laterality Date  . Insert / replace / remove pacemaker  11/2008    [employer]ude DUAL chamber pacemaker 11/06/2008  . Total knee arthroplasty  04/2009    Right  . Cataract extraction w/phaco  03/29/2011    Procedure: CATARACT EXTRACTION PHACO AND INTRAOCULAR LENS PLACEMENT (IOC);  Surgeon: Tonny Branch;  Location: [facility];  Service: Ophthalmology;  Laterality: Right;  CDE 12.62   Family History  Problem Relation Age of Onset  . Hypotension Neg Hx   . Anesthesia problems Neg Hx   . Pseudochol deficiency Neg Hx   . Malignant hyperthermia Neg Hx   . Cancer Other     unknown cancer   History  Substance Use Topics  . Smoking status: Never Smoker   . Smokeless tobacco: Never Used  . Alcohol Use: No   Lives at home Uses a cane  OB History   Grav Para Term Preterm Abortions TAB SAB Ect Mult Living                 Review of Systems  All other systems reviewed and are negative.     Allergies  Review of patient's allergies indicates no known allergies.  Home  Medications   Prior to Admission medications   Medication Sig Start Date End Date Taking? Authorizing Provider  acetaminophen (TYLENOL) 325 MG tablet Take 325 mg by mouth every 6 (six) hours as needed for moderate pain.   Yes Historical Provider, MD  albuterol (PROVENTIL HFA;VENTOLIN HFA) 108 (90 BASE) MCG/ACT inhaler Inhale 2 puffs into the lungs every 6 (six) hours as needed for wheezing or shortness of breath.   Yes Historical Provider, MD  allopurinol (ZYLOPRIM) 100 MG tablet Take 100 mg by mouth daily.     Yes Historical Provider, MD  diltiazem (TIAZAC) 360 MG 24 hr capsule Take 360 mg by mouth daily.   Yes Historical Provider, MD  dorzolamide-timolol (COSOPT) 22.3-6.8 MG/ML  ophthalmic solution Place 1 drop into the left eye at bedtime.    Yes Historical Provider, MD  furosemide (LASIX) 20 MG tablet Take 20 mg by mouth daily.   Yes Historical Provider, MD  lisinopril (PRINIVIL,ZESTRIL) 20 MG tablet Take 20 mg by mouth daily.   Yes Historical Provider, MD  pravastatin (PRAVACHOL) 80 MG tablet Take 80 mg by mouth at bedtime.   Yes Historical Provider, MD  ranitidine (ZANTAC) 150 MG tablet Take 150 mg by mouth at bedtime.    Yes Historical Provider, MD  warfarin (COUMADIN) 5 MG tablet Take 2.5 mg by mouth daily. 08/21/13  Yes Charlynne Cousins, MD  meclizine (ANTIVERT) 25 MG tablet Take 25 mg by mouth 3 (three) times daily as needed. Dizziness 11/11/10   Imogene Burn, PA-C   BP 156/68  Pulse 79  Temp(Src) 97.7 F (36.5 C) (Oral)  Resp 18  Ht 5' 1.5" (1.562 m)  Wt 178 lb (80.74 kg)  BMI 33.09 kg/m2  SpO2 100%  Vital signs normal   Physical Exam  Nursing note and vitals reviewed. Constitutional: She is oriented to person, place, and time. She appears well-developed and well-nourished.  Non-toxic appearance. She does not appear ill. No distress.  HENT:  Head: Normocephalic and atraumatic.  Right Ear: External ear normal.  Left Ear: External ear normal.  Nose: Nose normal. No mucosal edema or rhinorrhea.  Mouth/Throat: Oropharynx is clear and moist and mucous membranes are normal. No dental abscesses or uvula swelling.  Eyes: Conjunctivae and EOM are normal.  Left pupil is dilated and nonreactive, she does not have a light reflex on the left. Her right pupil is small but reactive to light. There is no pupillary contraction of the right when I shine light in the left eye. I am unable to see her disc. I'm going to insert the dilator and try again. Nurse reports patient unable to count fingers, she is only able to see the nurses outline with either eye.  Neck: Normal range of motion and full passive range of motion without pain. Neck supple.  Cardiovascular:  Normal rate, regular rhythm and normal heart sounds.  Exam reveals no gallop and no friction rub.   No murmur heard. Pulmonary/Chest: Effort normal and breath sounds normal. No respiratory distress. She has no wheezes. She has no rhonchi. She has no rales. She exhibits no tenderness and no crepitus.  Abdominal: Soft. Normal appearance and bowel sounds are normal. She exhibits no distension. There is no tenderness. There is no rebound and no guarding.  Musculoskeletal: Normal range of motion. She exhibits no edema and no tenderness.  Moves all extremities well.   Neurological: She is alert and oriented to person, place, and time. She has normal strength. No cranial nerve deficit.  Patient has no facial asymmetry, her grips are equal, there is no pronator drift, there is no focal weakness noted  Skin: Skin is warm, dry and intact. No rash noted. No erythema. No pallor.  Psychiatric: She has a normal mood and affect. Her speech is normal and behavior is normal. Her mood appears not anxious.    ED Course  Procedures (including critical care time)  Medications  0.9 %  sodium chloride infusion ( Intravenous New Bag/Given 01/20/14 1337)  tropicamide (MYDRIACYL) 1 % ophthalmic solution 2 drop (2 drops Right Eye Given 01/20/14 1337)   14:00 recheck after her right pupil was dilated. I am unable to see her fundus. There appears to be some red discoloration in the vitreous humor. However patient's visual acuity has improved. She is now able to count fingers at about 2-1/2 feet. Pt has probably had a retinal detachment again, but also states had similar with her prior stroke, will talk to neuro and ophthalmology.   15:42 Dr Aram Beecham, neurology, will see patient at [facility] ED, wants to get MR brain, if negative, she can be discharged/admitted by ophthalmology  15:45 Dr Alvino Chapel, EDP at [facility] made aware of transfer  15:46 Malachy Mood, Charge Nurse at [facility] made aware of transfer.  15:55 Dr Lucita Ferrara, Ophthalmology,  can see patient when she arrives in the [facility] ED  Labs Review Results for orders placed during the hospital encounter of 01/20/14  CBC WITH DIFFERENTIAL      Result Value Ref Range   WBC 7.6  4.0 - 10.5 K/uL   RBC 4.66  3.87 - 5.11 MIL/uL   Hemoglobin 13.7  12.0 - 15.0 g/dL   HCT 42.4  36.0 - 46.0 %   MCV 91.0  78.0 - 100.0 fL   MCH 29.4  26.0 - 34.0 pg   MCHC 32.3  30.0 - 36.0 g/dL   RDW 13.8  11.5 - 15.5 %   Platelets 195  150 - 400 K/uL   Neutrophils Relative % 62  43 - 77 %   Neutro Abs 4.7  1.7 - 7.7 K/uL   Lymphocytes Relative 28  12 - 46 %   Lymphs Abs 2.1  0.7 - 4.0 K/uL   Monocytes Relative 9  3 - 12 %   Monocytes Absolute 0.7  0.1 - 1.0 K/uL   Eosinophils Relative 1  0 - 5 %   Eosinophils Absolute 0.1  0.0 - 0.7 K/uL   Basophils Relative 0  0 - 1 %   Basophils Absolute 0.0  0.0 - 0.1 K/uL  COMPREHENSIVE METABOLIC PANEL      Result Value Ref Range   Sodium 140  137 - 147 mEq/L   Potassium 4.2  3.7 - 5.3 mEq/L   Chloride 104  96 - 112 mEq/L   CO2 23  19 - 32 mEq/L   Glucose, Bld 101 (*) 70 - 99 mg/dL   BUN 21  6 - 23 mg/dL   Creatinine, Ser 1.26 (*) 0.50 - 1.10 mg/dL   Calcium 10.2  8.4 - 10.5 mg/dL   Total Protein 7.4  6.0 - 8.3 g/dL   Albumin 4.1  3.5 - 5.2 g/dL   AST 20  0 - 37 U/L   ALT 12  0 - 35 U/L   Alkaline Phosphatase 91  39 - 117 U/L   Total Bilirubin 0.3  0.3 - 1.2 mg/dL   GFR calc non Af Amer 38 (*) >90 mL/min   GFR calc Af Amer 44 (*) >  90 mL/min   Anion gap 13  5 - 15  APTT      Result Value Ref Range   aPTT 30  24 - 37 seconds  PROTIME-INR      Result Value Ref Range   Prothrombin Time 20.6 (*) 11.6 - 15.2 seconds   INR 1.77 (*) 0.00 - 1.49  TROPONIN I      Result Value Ref Range   Troponin I <0.30  <0.30 ng/mL   Laboratory interpretation all normal except subtherapeutic INR   Imaging Review Ct Head Wo Contrast  01/20/2014   CLINICAL DATA:  Acute onset of visual changes.  History of stroke.  EXAM: CT HEAD WITHOUT CONTRAST  TECHNIQUE:  Contiguous axial images were obtained from the base of the skull through the vertex without intravenous contrast.  COMPARISON:  08/20/2013  FINDINGS: Sinuses/Soft tissues: Mucosal thickening of the right maxillary sinus. Hyperostosis frontalis interna. Clear mastoid air cells.  Intracranial: Moderate low density in the periventricular white matter likely related to small vessel disease. Remote scattered infarcts within the left cerebellar hemisphere. Remote lacunar infarcts within the basal ganglia, greater on the right. No hemorrhage, large vessel cortical based infarct, hydrocephalus, intra-axial, or extra-axial fluid collection.  IMPRESSION: 1.  No acute intracranial abnormality. 2. Chronic small vessel ischemic changes with scattered remote infarcts. 3. Sinus disease.   Electronically Signed   By: Abigail Miyamoto M.D.   On: 01/20/2014 13:33     EKG Interpretation   Date/Time:  Sunday January 20 2014 12:59:54 EDT Ventricular Rate:  71 PR Interval:    QRS Duration: 68 QT Interval:  402 QTC Calculation: 436 R Axis:   -12 Text Interpretation:  Atrial fibrillation with occasional  ventricular-paced complexes Septal infarct , age undetermined Low voltage  QRS No significant change since last tracing 20 Aug 2013 Confirmed by  Morgan Keinath  MD-I, Coden Franchi ([id number]) on 01/20/2014 1:39:32 PM      MDM   Final diagnoses:  Visual loss, right eye  Retinal hemorrhage, right    Plan transfer to [facility] ED to see Dr Lucita Ferrara and Dr Aram Beecham    Rolland Porter, MD, Abram Sander     [name], MD 01/20/14 [phone]

## 2014-01-20 NOTE — Consult Note (Signed)
  Ophthalmology Peterson Ao Almeta Geisel,MD [id number]  CC:  "RECENT BLOOD IN THE EYE AND MONOCULAR SECONDARY TO OLD CRAO OS"  UCVA NEAR CARD 20/100 HAND MOTION  IOP 26 65  SLE CORNEA CLEAR OD AND +1 MCE OS AC DEEP OU LENS: PSEUDOPHAKIC OD AND +4 BRUNESCENT NSC OS  FUNDUS OD: DISC SHARP PER VIEW OF POSTERIOR POLE SECONDARY TO VITREOUS HEMORRHAGE.  RETINA APPEARS FLAT ON THE BUCKLE  OS:  POOR VIEW SECONDARY TO Power BUT BY HISTORY AND VIEW IT APPEARS AS IF SHE HAS HAD AN OLD RETINAL VASCULAR OCCLUSION.  IMPRESSION:  1. RETINAL SURGERY (UNKNOWN TYPE-DR. JACKLIN) PRESUMED RD WITH SB OD 2. CATARACT SURGERY (DR. HUNT) 3. RECENT VITREOUS HEMORRHAGE 4. ANTICOAGULATION FOR HEART VALVE SUBTHERAPEUTIC COUMADIN LEVELS 5. 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 THE ONLY ACUTE OCULAR EVENT IS THE VITREOUS HEMORRHAGE.  I HAVE GIVEN HER DR. MATTHEWS AND DR. SANDERS OFFICE NUMBERS BECAUSE THEY BOTH ARE SEEING PATIENTS ON Monday AND CAN SET UP AN APPOINTMENT TO FOLLOWUP THE VITREOUS HEMORRHAGE. 2. COUMADIN LEVELS PER PRIMARY CARE DOCTOR.

## 2014-01-20 NOTE — ED Notes (Signed)
[facility] here to transport pt, report given to Delia Chimes at [facility],

## 2014-01-20 NOTE — ED Notes (Signed)
Pt unable to determine how many fingers RN is holding up while RN is standing at end of bed, pt states "everything is a fog", normally pt is not able to see out of left eye other than fog but right eye is new per pt,

## 2014-01-20 NOTE — ED Notes (Signed)
Pt states that she was at church when she saw a "field of blood" come across her right eye, the field of blood went away and pt started seeing black spots and a fog. Pt arrives to er c/o seeing fog in right eye, pt alert, able to answer questions, denies any pain, speech is symmetrical, grips equal bilateral. Dr Tomi Bamberger at bedside, pt a-fib on monitor,

## 2014-01-20 NOTE — ED Notes (Signed)
Pt returned from ct scan with RN at bedside,

## 2014-01-20 NOTE — Discharge Instructions (Signed)
Visual Disturbances °You have had a disturbance in your vision. This may be caused by various conditions, such as: °· Migraines. Migraine headaches are often preceded by a disturbance in vision. Blind spots or light flashes are followed by a headache. This type of visual disturbance is temporary. It does not damage the eye. °· Glaucoma. This is caused by increased pressure in the eye. Symptoms include haziness, blurred vision, or seeing rainbow colored circles when looking at bright lights. Partial or complete visual loss can occur. You may or may not experience eye pain. Visual loss may be gradual or sudden and is irreversible. Glaucoma is the leading cause of blindness. °· Retina problems. Vision will be reduced if the retina becomes detached or if there is a circulation problem as with diabetes, high blood pressure, or a mini-stroke. Symptoms include seeing "floaters," flashes of light, or shadows, as if a curtain has fallen over your eye. °· Optic nerve problems. The main nerve in your eye can be damaged by redness, soreness, and swelling (inflammation), poor circulation, drugs, and toxins. °It is very important to have a complete exam done by a specialist to determine the exact cause of your eye problem. The specialist may recommend medicines or surgery, depending on the cause of the problem. This can help prevent further loss of vision or reduce the risk of having a stroke. Contact the caregiver to whom you have been referred and arrange for follow-up care right away. °SEEK IMMEDIATE MEDICAL CARE IF:  °· Your vision gets worse. °· You develop severe headaches. °· You have any weakness or numbness in the face, arms, or legs. °· You have any trouble speaking or walking. °Document Released: 07/01/2004 Document Revised: 08/16/2011 Document Reviewed: 10/22/2009 °Exit[employer]® Patient Information ©2015 [employer]. This information is not intended to replace advice given to you by your health care provider. Make sure  you discuss any questions you have with your health care provider. ° °

## 2014-01-20 NOTE — ED Notes (Addendum)
Pt states that while in church she "just saw blood"in her rt eye for "a few minutes"then it turned to "smoke". No bleeding  noted Reports blurred "smoky" vision at this time. NAD noted.

## 2014-01-20 NOTE — ED Notes (Signed)
Pt assisted to restroom, tolerated well,  

## 2014-01-20 NOTE — ED Notes (Signed)
Ophthalmology MD at bedside

## 2014-01-20 NOTE — ED Notes (Signed)
Pt transported to ct with RN at bedside,

## 2014-01-20 NOTE — ED Provider Notes (Signed)
CSN: [id number]     Arrival date & time 01/20/14  1227 History   First MD Initiated Contact with Patient 01/20/14 1250     Chief Complaint  Patient presents with  . Blurred Vision   [name] is an 80 yo caucasian F w/PMH of Tachy-brady syndrome s/p PM, AFib on coumadin, HTN, HLD, stroke w/residual left sided weakness and left eye blindness, and chronic renal insufficiency who presents from OSH w/vision changes in right eye. Pt was at church when she saw blood in her vision for several minutes. This then changes to a smoky appearance and has been there since.   She denies new weakness/numbness, CP, SOB, fever, chills, N/V, diarrhea, constipation, hematemesis, dysuria, hematuria, sick contacts, or recent travel.   (Consider location/radiation/quality/duration/timing/severity/associated sxs/prior Treatment) Patient is a 80 y.o. female presenting with eye problem.  Eye Problem Location:  R eye Quality: smoky and bloody vision. Severity:  Moderate Onset quality:  Sudden Timing:  Constant Progression:  Unchanged Chronicity:  New Context: not contact lens problem, not direct trauma and not smoke exposure   Relieved by:  Nothing Worsened by:  Nothing tried Ineffective treatments:  None tried Associated symptoms: decreased vision   Associated symptoms: no blurred vision, no double vision, no foreign body sensation, no headaches, no itching, no nausea, no numbness, no photophobia, no redness, no swelling, no tearing, no tingling and no vomiting   Risk factors: no previous injury to eye     Past Medical History  Diagnosis Date  . Tachycardia-bradycardia syndrome     with pauses and syncope;PPM implantation 10/2008,negative stress nuclear study 03/2005  . Atrial fibrillation     persistant  . Mitral valve disease     Severe mitral annular calcification; mild to moderate stenosis-not clearly rheumatic  . Hypertension     heart disease diastolic dysfunction ; 123456; mild pulmonary  edema in 2006;responded to diurectics ; LVH  . Chronic renal insufficiency     baseline creatine 1.4; 1.04 in 03/2010  . Hyperlipidemia   . Angioedema 08/2006  . Anemia     H/H of 11.3/36.7 in 12/2008 ;normal MCV; normal CBC in 2011  . Gout   . 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 Vertigo   . Shortness of breath   . Chronic bronchitis   . Blood transfusion   . DJD (degenerative joint disease)     hands, knees  . Seizure disorder   . Nephrolithiasis   . Stroke   . Retinal hemorrhage    Past Surgical History  Procedure Laterality Date  . Insert / replace / remove pacemaker  11/2008    St.Jude DUAL chamber pacemaker 11/06/2008  . Total knee arthroplasty  04/2009    Right  . Cataract extraction w/phaco  03/29/2011    Procedure: CATARACT EXTRACTION PHACO AND INTRAOCULAR LENS PLACEMENT (IOC);  Surgeon: Tonny Branch;  Location: [facility];  Service: Ophthalmology;  Laterality: Right;  CDE 12.62   Family History  Problem Relation Age of Onset  . Hypotension Neg Hx   . Anesthesia problems Neg Hx   . Pseudochol deficiency Neg Hx   . Malignant hyperthermia Neg Hx   . Cancer Other     unknown cancer   History  Substance Use Topics  . Smoking status: Never Smoker   . Smokeless  tobacco: Never Used  . Alcohol Use: No   OB History   Grav Para Term Preterm Abortions TAB SAB Ect Mult Living                 Review of Systems  Constitutional: Negative for fever and chills.  Eyes: Positive for visual disturbance. Negative for blurred vision, double vision, photophobia, redness and itching.  Respiratory: Negative for shortness of breath.   Cardiovascular: Negative for chest pain, palpitations and leg swelling.  Gastrointestinal: Negative for nausea, vomiting, abdominal pain, diarrhea,  constipation and abdominal distention.  Genitourinary: Negative for dysuria, frequency, flank pain and decreased urine volume.  Neurological: Negative for dizziness, tingling, speech difficulty, light-headedness, numbness and headaches.  All other systems reviewed and are negative.     Allergies  Review of patient's allergies indicates no known allergies.  Home Medications   Prior to Admission medications   Medication Sig Start Date End Date Taking? Authorizing Provider  acetaminophen (TYLENOL) 325 MG tablet Take 325 mg by mouth every 6 (six) hours as needed for moderate pain.   Yes Historical Provider, MD  albuterol (PROVENTIL HFA;VENTOLIN HFA) 108 (90 BASE) MCG/ACT inhaler Inhale 2 puffs into the lungs every 6 (six) hours as needed for wheezing or shortness of breath.   Yes Historical Provider, MD  allopurinol (ZYLOPRIM) 100 MG tablet Take 100 mg by mouth daily.     Yes Historical Provider, MD  diltiazem (TIAZAC) 360 MG 24 hr capsule Take 360 mg by mouth daily.   Yes Historical Provider, MD  dorzolamide-timolol (COSOPT) 22.3-6.8 MG/ML ophthalmic solution Place 1 drop into the left eye at bedtime.    Yes Historical Provider, MD  furosemide (LASIX) 20 MG tablet Take 20 mg by mouth daily.   Yes Historical Provider, MD  lisinopril (PRINIVIL,ZESTRIL) 20 MG tablet Take 20 mg by mouth daily.   Yes Historical Provider, MD  pravastatin (PRAVACHOL) 80 MG tablet Take 80 mg by mouth at bedtime.   Yes Historical Provider, MD  ranitidine (ZANTAC) 150 MG tablet Take 150 mg by mouth at bedtime.    Yes Historical Provider, MD  warfarin (COUMADIN) 5 MG tablet Take 2.5 mg by mouth daily. 08/21/13  Yes Charlynne Cousins, MD  meclizine (ANTIVERT) 25 MG tablet Take 25 mg by mouth 3 (three) times daily as needed. Dizziness 11/11/10   Imogene Burn, PA-C   BP 164/69  Pulse 70  Temp(Src) 97.7 F (36.5 C) (Oral)  Resp 20  Ht 5' 1.5" (1.562 m)  Wt 178 lb (80.74 kg)  BMI 33.09 kg/m2  SpO2 98% Physical  Exam  Nursing note and vitals reviewed. Constitutional: She appears well-developed and well-nourished. No distress.  HENT:  Head: Normocephalic and atraumatic.  Eyes: Conjunctivae and EOM are normal. Right conjunctiva is not injected. Right conjunctiva has no hemorrhage. Left conjunctiva is not injected. Left conjunctiva has no hemorrhage. Right pupil is not reactive. Left pupil is not reactive.  Cardiovascular: Normal rate, regular rhythm, normal heart sounds and intact distal pulses.  Exam reveals no gallop and no friction rub.   No murmur heard. Pulmonary/Chest: Effort normal and breath sounds normal. No respiratory distress. She has no wheezes. She has no rales. She exhibits no tenderness.  Abdominal: Soft. Bowel sounds are normal. She exhibits no distension and no mass. There is no tenderness. There is no rebound and no guarding.  Lymphadenopathy:    She has no cervical adenopathy.  Skin: Skin is warm and dry. She is not diaphoretic.  ED Course  Procedures (including critical care time) Labs Review Labs Reviewed  COMPREHENSIVE METABOLIC PANEL - Abnormal; Notable for the following:    Glucose, Bld 101 (*)    Creatinine, Ser 1.26 (*)    GFR calc non Af Amer 38 (*)    GFR calc Af Amer 44 (*)    All other components within normal limits  PROTIME-INR - Abnormal; Notable for the following:    Prothrombin Time 20.6 (*)    INR 1.77 (*)    All other components within normal limits  CBC WITH DIFFERENTIAL  APTT  TROPONIN I    Imaging Review Ct Head Wo Contrast  01/20/2014   CLINICAL DATA:  Acute onset of visual changes.  History of stroke.  EXAM: CT HEAD WITHOUT CONTRAST  TECHNIQUE: Contiguous axial images were obtained from the base of the skull through the vertex without intravenous contrast.  COMPARISON:  08/20/2013  FINDINGS: Sinuses/Soft tissues: Mucosal thickening of the right maxillary sinus. Hyperostosis frontalis interna. Clear mastoid air cells.  Intracranial: Moderate low  density in the periventricular white matter likely related to small vessel disease. Remote scattered infarcts within the left cerebellar hemisphere. Remote lacunar infarcts within the basal ganglia, greater on the right. No hemorrhage, large vessel cortical based infarct, hydrocephalus, intra-axial, or extra-axial fluid collection.  IMPRESSION: 1.  No acute intracranial abnormality. 2. Chronic small vessel ischemic changes with scattered remote infarcts. 3. Sinus disease.   Electronically Signed   By: Abigail Miyamoto M.D.   On: 01/20/2014 13:33     EKG Interpretation   Date/Time:  Sunday January 20 2014 12:59:54 EDT Ventricular Rate:  71 PR Interval:    QRS Duration: 68 QT Interval:  402 QTC Calculation: 436 R Axis:   -12 Text Interpretation:  Atrial fibrillation with occasional  ventricular-paced complexes Septal infarct , age undetermined Low voltage  QRS No significant change since last tracing 20 Aug 2013 Confirmed by  [facility]  MD-I, IVA ([id number]) on 01/20/2014 1:39:32 PM      MDM   80 yo AAF w/vision problems. Please see HPI for details. On exam, pt in NAD, AFVSS aside from HTN w/systolics in the 123456. EKG shows AFib w/occassional PVCs. CT at OSH showed no acute ischemia.   Pt has a pacemaker and therefore, will not proceed w/MRI at this time. Consulted ophthalmologist.   Ophthalmologist diagnosed vitreous hemorrhage. FU in ophthalmology on Monday (Dr. Baird Cancer).   Final diagnoses:  Visual loss, right eye  Retinal hemorrhage, right    Pt was seen under the supervision of Dr. Alvino Chapel.     Sherian Maroon, MD 01/20/14 1758

## 2014-01-20 NOTE — ED Notes (Signed)
About an hour ago had vision changes to right eye while at church singing, states looked like blood covering her right eye and then saw spots, denies seeing blood at present but still sees spots, denies any other symptoms; pt states hx of CVA on left side

## 2014-01-21 ENCOUNTER — Encounter (INDEPENDENT_AMBULATORY_CARE_PROVIDER_SITE_OTHER): Payer: Medicare Other | Admitting: Ophthalmology

## 2014-01-21 ENCOUNTER — Ambulatory Visit (INDEPENDENT_AMBULATORY_CARE_PROVIDER_SITE_OTHER): Payer: Medicare Other | Admitting: *Deleted

## 2014-01-21 DIAGNOSIS — I639 Cerebral infarction, unspecified: Secondary | ICD-10-CM

## 2014-01-21 DIAGNOSIS — H4310 Vitreous hemorrhage, unspecified eye: Secondary | ICD-10-CM

## 2014-01-21 DIAGNOSIS — I10 Essential (primary) hypertension: Secondary | ICD-10-CM

## 2014-01-21 DIAGNOSIS — I495 Sick sinus syndrome: Secondary | ICD-10-CM

## 2014-01-21 DIAGNOSIS — H35039 Hypertensive retinopathy, unspecified eye: Secondary | ICD-10-CM

## 2014-01-21 DIAGNOSIS — Z5181 Encounter for therapeutic drug level monitoring: Secondary | ICD-10-CM

## 2014-01-21 DIAGNOSIS — H2510 Age-related nuclear cataract, unspecified eye: Secondary | ICD-10-CM

## 2014-01-21 DIAGNOSIS — H26499 Other secondary cataract, unspecified eye: Secondary | ICD-10-CM

## 2014-01-21 DIAGNOSIS — I6350 Cerebral infarction due to unspecified occlusion or stenosis of unspecified cerebral artery: Secondary | ICD-10-CM

## 2014-01-21 DIAGNOSIS — H43819 Vitreous degeneration, unspecified eye: Secondary | ICD-10-CM

## 2014-01-21 LAB — POCT INR: INR: 2.1

## 2014-01-28 ENCOUNTER — Ambulatory Visit (INDEPENDENT_AMBULATORY_CARE_PROVIDER_SITE_OTHER): Payer: Medicare Other | Admitting: Ophthalmology

## 2014-01-28 DIAGNOSIS — H4310 Vitreous hemorrhage, unspecified eye: Secondary | ICD-10-CM

## 2014-01-28 DIAGNOSIS — H2700 Aphakia, unspecified eye: Secondary | ICD-10-CM

## 2014-01-30 ENCOUNTER — Encounter: Payer: Self-pay | Admitting: Internal Medicine

## 2014-01-30 ENCOUNTER — Ambulatory Visit (INDEPENDENT_AMBULATORY_CARE_PROVIDER_SITE_OTHER): Payer: Medicare Other | Admitting: Cardiovascular Disease

## 2014-01-30 ENCOUNTER — Encounter: Payer: Self-pay | Admitting: Cardiovascular Disease

## 2014-01-30 VITALS — BP 150/64 | HR 74 | Ht 62.0 in | Wt 186.0 lb

## 2014-01-30 DIAGNOSIS — I639 Cerebral infarction, unspecified: Secondary | ICD-10-CM

## 2014-01-30 DIAGNOSIS — Z5181 Encounter for therapeutic drug level monitoring: Secondary | ICD-10-CM

## 2014-01-30 DIAGNOSIS — I495 Sick sinus syndrome: Secondary | ICD-10-CM

## 2014-01-30 DIAGNOSIS — Z950 Presence of cardiac pacemaker: Secondary | ICD-10-CM

## 2014-01-30 DIAGNOSIS — I482 Chronic atrial fibrillation, unspecified: Secondary | ICD-10-CM

## 2014-01-30 DIAGNOSIS — E785 Hyperlipidemia, unspecified: Secondary | ICD-10-CM

## 2014-01-30 DIAGNOSIS — I4891 Unspecified atrial fibrillation: Secondary | ICD-10-CM

## 2014-01-30 DIAGNOSIS — I10 Essential (primary) hypertension: Secondary | ICD-10-CM

## 2014-01-30 DIAGNOSIS — I6350 Cerebral infarction due to unspecified occlusion or stenosis of unspecified cerebral artery: Secondary | ICD-10-CM

## 2014-01-30 DIAGNOSIS — Z01818 Encounter for other preprocedural examination: Secondary | ICD-10-CM

## 2014-01-30 DIAGNOSIS — Z7901 Long term (current) use of anticoagulants: Secondary | ICD-10-CM

## 2014-01-30 NOTE — Progress Notes (Signed)
Patient ID: [name], female   DOB: September 02, 1926, 80 y.o.   MRN: [id number]      SUBJECTIVE: The patient is an 80 year old woman with a history of permanent atrial fibrillation, tachycardia-bradycardia syndrome status post pacemaker implantation and follows with Dr. Lovena Le, essential hypertension, hyperlipidemia, and prior stroke after missing warfarin as she forgot to take it. She was previously offered a target specific oral anticoagulant but said she could not afford it, and is maintained on warfarin for anticoagulation. She was recently diagnosed with a vitreous hemorrhage and requires retinal surgery. She underwent a normal Lexiscan nuclear myocardial perfusion study in November 2014. Echocardiogram in March 2015 demonstrated vigorous left ventricular systolic function, EF [id number], mild LVH, mild to moderate mitral stenosis, mild mitral regurgitation, and mild to moderate left atrial enlargement. She is here with her son and daughter. She has been doing very well from a cardiovascular standpoint, and denies chest pain, palpitations, shortness of breath, leg swelling and syncope.  Review of Systems: As per "subjective", otherwise negative.  No Known Allergies  Current Outpatient Prescriptions  Medication Sig Dispense Refill  . acetaminophen (TYLENOL) 325 MG tablet Take 325 mg by mouth every 6 (six) hours as needed for moderate pain.      Marland Kitchen albuterol (PROVENTIL HFA;VENTOLIN HFA) 108 (90 BASE) MCG/ACT inhaler Inhale 2 puffs into the lungs every 6 (six) hours as needed for wheezing or shortness of breath.      . allopurinol (ZYLOPRIM) 100 MG tablet Take 100 mg by mouth daily.        Marland Kitchen diltiazem (TIAZAC) 360 MG 24 hr capsule Take 360 mg by mouth daily.      . dorzolamide-timolol (COSOPT) 22.3-6.8 MG/ML ophthalmic solution Place 1 drop into the left eye at bedtime.       . furosemide (LASIX) 20 MG tablet Take 20 mg by mouth daily.      Marland Kitchen lisinopril (PRINIVIL,ZESTRIL) 20 MG tablet Take 20  mg by mouth daily.      . meclizine (ANTIVERT) 25 MG tablet Take 25 mg by mouth 3 (three) times daily as needed. Dizziness      . pravastatin (PRAVACHOL) 80 MG tablet Take 80 mg by mouth at bedtime.      . ranitidine (ZANTAC) 150 MG tablet Take 150 mg by mouth at bedtime.       Marland Kitchen warfarin (COUMADIN) 5 MG tablet Take 2.5 mg by mouth daily.      . [DISCONTINUED] colchicine 0.6 MG tablet Take 0.6 mg by mouth daily as needed. Gout Flare Ups      . [DISCONTINUED] diltiazem (CARDIZEM CD) 360 MG 24 hr capsule TAKE (1) CAPSULE BY MOUTH ONCE DAILY.  30 capsule  5  . [DISCONTINUED] simvastatin (ZOCOR) 40 MG tablet Take 40 mg by mouth at bedtime.         No current facility-administered medications for this visit.    Past Medical History  Diagnosis Date  . Tachycardia-bradycardia syndrome     with pauses and syncope;PPM implantation 10/2008,negative stress nuclear study 03/2005  . Atrial fibrillation     persistant  . Mitral valve disease     Severe mitral annular calcification; mild to moderate stenosis-not clearly rheumatic  . Hypertension     heart disease diastolic dysfunction ; 123456; mild pulmonary edema in 2006;responded to diurectics ; LVH  . Chronic renal insufficiency     baseline creatine 1.4; 1.04 in 03/2010  . Hyperlipidemia   . Angioedema 08/2006  . Anemia  H/H of 11.3/36.7 in 12/2008 ;normal MCV; normal CBC in 2011  . Gout   . 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 Vertigo   . Shortness of breath   . Chronic bronchitis   . Blood transfusion   . DJD (degenerative joint disease)     hands, knees  . Seizure disorder   . Nephrolithiasis   . Stroke   . Retinal hemorrhage     Past Surgical History  Procedure Laterality Date  . Insert / replace / remove pacemaker  11/2008     St.Jude DUAL chamber pacemaker 11/06/2008  . Total knee arthroplasty  04/2009    Right  . Cataract extraction w/phaco  03/29/2011    Procedure: CATARACT EXTRACTION PHACO AND INTRAOCULAR LENS PLACEMENT (IOC);  Surgeon: Tonny Branch;  Location: [facility];  Service: Ophthalmology;  Laterality: Right;  CDE 12.62    History   Social History  . Marital Status: Married    Spouse Name: N/A    Number of Children: N/A  . Years of Education: N/A   Occupational History  . Not on file.   Social History Main Topics  . Smoking status: Never Smoker   . Smokeless tobacco: Never Used  . Alcohol Use: No  . Drug Use: No  . Sexual Activity: Not on file   Other Topics Concern  . Not on file   Social History Narrative  . No narrative on file     Filed Vitals:   01/30/14 1422  BP: 150/64  Pulse: 74  Height: 5\' 2"  (1.575 m)  Weight: 186 lb (84.369 kg)  SpO2: 95%    PHYSICAL EXAM General: NAD HEENT: Normal. Neck: No JVD, no thyromegaly. Lungs: Clear to auscultation bilaterally with normal respiratory effort. CV: Nondisplaced PMI.  Regular rate and rhythm, normal S1/split S2, no S3/S4, soft I/VI systolic murmur along left sternal border. No pretibial or periankle edema.  Abdomen: Soft, nontender, no hepatosplenomegaly, no distention.  Neurologic: Alert and oriented x 3.  Psych: Normal affect. Skin: Normal. Musculoskeletal: Normal range of motion, no gross deformities. Extremities: No clubbing or cyanosis.   ECG: Most recent ECG reviewed.      ASSESSMENT AND PLAN: 1. Atrial fibrillation: She is maintained on warfarin and long-acting diltiazem, and is without symptoms. As she will need to hold warfarin prior to undergoing retinal surgery, and given her history of prior stroke, I would recommend bridging with enoxaparin and will arrange for this through our anticoagulation clinic.  2. Essential HTN: Reasonably controlled for age, but would consider increasing therapy with lisinopril if  it were to remain elevated in the future.  3. Hyperlipidemia: On high-intensity statin therapy with pravastatin.  4. Preoperative risk stratification: As retinal surgery is a low-risk surgery, and given the fact that she had a normal nuclear MPI study in 04/2013 with vigorous LV systolic function by echo in 08/2013, no additional noninvasive testing is required. She is at low risk for a major adverse cardiovascular event. Again, I recommend bridging with Lovenox while warfarin is held given her prior stroke.  5. Tachycardia-bradycardia syndrome s/p pacemaker: Normal device function on 12/03/13, with no high ventricular rates noted. Follows with Dr. Lovena Le.  Dispo: f/u 6 months.  Kate Sable, M.D., F.A.C.C.

## 2014-02-01 NOTE — H&P (Signed)
[name] is an 80 y.o. female.   Chief Complaint:sudden loss of vision in only right eye HPI: Light perception vision OS from CVA, now has vitreous hemorrhage right eye  Past Medical History  Diagnosis Date  . Tachycardia-bradycardia syndrome     with pauses and syncope;PPM implantation 10/2008,negative stress nuclear study 03/2005  . Atrial fibrillation     persistant  . Mitral valve disease     Severe mitral annular calcification; mild to moderate stenosis-not clearly rheumatic  . Hypertension     heart disease diastolic dysfunction ; 123456; mild pulmonary edema in 2006;responded to diurectics ; LVH  . Chronic renal insufficiency     baseline creatine 1.4; 1.04 in 03/2010  . Hyperlipidemia   . Angioedema 08/2006  . Anemia     H/H of 11.3/36.7 in 12/2008 ;normal MCV; normal CBC in 2011  . Gout   . 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 Vertigo   . Shortness of breath   . Chronic bronchitis   . Blood transfusion   . DJD (degenerative joint disease)     hands, knees  . Seizure disorder   . Nephrolithiasis   . Stroke   . Retinal hemorrhage     Past Surgical History  Procedure Laterality Date  . Insert / replace / remove pacemaker  11/2008    St.Jude DUAL chamber pacemaker 11/06/2008  . Total knee arthroplasty  04/2009    Right  . Cataract extraction w/phaco  03/29/2011    Procedure: CATARACT EXTRACTION PHACO AND INTRAOCULAR LENS PLACEMENT (IOC);  Surgeon: Tonny Branch;  Location: [facility];  Service: Ophthalmology;  Laterality: Right;  CDE 12.62    Family History  Problem Relation Age of Onset  . Hypotension Neg Hx   . Anesthesia problems Neg Hx   . Pseudochol deficiency Neg Hx   . Malignant hyperthermia Neg Hx   . Cancer Other     unknown cancer   Social  History:  reports that she has never smoked. She has never used smokeless tobacco. She reports that she does not drink alcohol or use illicit drugs.  Allergies: No Known Allergies  No prescriptions prior to admission    Review of systems otherwise negative  There were no vitals taken for this visit.  Physical exam: Mental status: oriented x3. Eyes: See eye exam associated with this date of surgery in media tab.  Scanned in by scanning center Ears, Nose, Throat: within normal limits Neck: Within Normal limits General: within normal limits Chest: Within normal limits Breast: deferred Heart: Within normal limits Abdomen: Within normal limits GU: deferred Extremities: within normal limits Skin: within normal limits  Assessment/Plan Light perception vision OS from CVA, now has vitreous hemorrhage right eye Plan: To [facility] for Pars plana vitrectomy with membrane peel, laser treatment, gas injection right eye  [name] 02/01/2014, 3:06 PM

## 2014-02-04 ENCOUNTER — Ambulatory Visit (INDEPENDENT_AMBULATORY_CARE_PROVIDER_SITE_OTHER): Payer: Medicare Other | Admitting: *Deleted

## 2014-02-04 DIAGNOSIS — I495 Sick sinus syndrome: Secondary | ICD-10-CM

## 2014-02-04 DIAGNOSIS — I639 Cerebral infarction, unspecified: Secondary | ICD-10-CM

## 2014-02-04 DIAGNOSIS — Z5181 Encounter for therapeutic drug level monitoring: Secondary | ICD-10-CM

## 2014-02-04 DIAGNOSIS — I6350 Cerebral infarction due to unspecified occlusion or stenosis of unspecified cerebral artery: Secondary | ICD-10-CM

## 2014-02-04 LAB — POCT INR: INR: 2.3

## 2014-02-04 MED ORDER — ENOXAPARIN SODIUM 80 MG/0.8ML ~~LOC~~ SOLN: 80.0000 mg | Freq: Two times a day (BID) | SUBCUTANEOUS | Status: DC

## 2014-02-04 NOTE — Patient Instructions (Addendum)
02/06/2014 last day to take coumadin 02/07/2014 no coumadin no lovenox 02/08/2014 no coumadin lovenox 80 mg 7am and lovenox 80mg  7pm 02/09/2014 no coumadin Lovenox 80 mg 7am and lovenox 80mg  7pm 02/10/2014 no coumadin Lovenox 80 mg 7am amd Lovenox 80 mg 7pm 02/11/2014 no coumadin lovenox 80 mg 7am  None in the pm 02/12/2014 no coumadin no lovenox day of procedure When instructed by Md doing procedure to restart lovenox restart at same dose 80 mg 7am and 7pm and take couamdin at same dose except for 2 days take extra 1/2 tablet  Recheck INR

## 2014-02-07 ENCOUNTER — Encounter (HOSPITAL_COMMUNITY): Payer: Self-pay | Admitting: Pharmacy Technician

## 2014-02-08 ENCOUNTER — Encounter (HOSPITAL_COMMUNITY): Payer: Self-pay | Admitting: *Deleted

## 2014-02-11 MED ORDER — GATIFLOXACIN 0.5 % OP SOLN: 1.0000 [drp] | OPHTHALMIC | Status: DC | PRN

## 2014-02-11 MED ORDER — PHENYLEPHRINE HCL 2.5 % OP SOLN: 1.0000 [drp] | OPHTHALMIC | Status: DC | PRN

## 2014-02-11 MED ORDER — TROPICAMIDE 1 % OP SOLN: 1.0000 [drp] | OPHTHALMIC | Status: DC | PRN

## 2014-02-11 MED ORDER — CEFAZOLIN SODIUM-DEXTROSE 2-3 GM-% IV SOLR
2.0000 g | INTRAVENOUS | Status: AC
  Administered 2014-02-12: 2 g via INTRAVENOUS
  Filled 2014-02-11: qty 50

## 2014-02-11 MED ORDER — CYCLOPENTOLATE HCL 1 % OP SOLN: 1.0000 [drp] | OPHTHALMIC | Status: DC | PRN

## 2014-02-12 ENCOUNTER — Encounter (HOSPITAL_COMMUNITY): Payer: Medicare Other | Admitting: Certified Registered Nurse Anesthetist

## 2014-02-12 ENCOUNTER — Encounter (HOSPITAL_COMMUNITY): Payer: Self-pay | Admitting: Certified Registered Nurse Anesthetist

## 2014-02-12 ENCOUNTER — Encounter (HOSPITAL_COMMUNITY)
Admission: RE | Disposition: A | Discharge status: Home / Self Care | Payer: Self-pay | Source: Ambulatory Visit | Attending: Ophthalmology

## 2014-02-12 ENCOUNTER — Ambulatory Visit (HOSPITAL_COMMUNITY)
Admission: RE | Admit: 2014-02-12 | Discharge: 2014-02-13 | Disposition: A | Discharge status: Home / Self Care | Payer: Medicare Other | Source: Ambulatory Visit | Attending: Ophthalmology | Admitting: Ophthalmology

## 2014-02-12 ENCOUNTER — Ambulatory Visit (HOSPITAL_COMMUNITY): Payer: Medicare Other | Admitting: Certified Registered Nurse Anesthetist

## 2014-02-12 ENCOUNTER — Ambulatory Visit (HOSPITAL_COMMUNITY): Payer: Medicare Other

## 2014-02-12 ENCOUNTER — Encounter (INDEPENDENT_AMBULATORY_CARE_PROVIDER_SITE_OTHER): Payer: Medicare Other | Admitting: Ophthalmology

## 2014-02-12 DIAGNOSIS — I4891 Unspecified atrial fibrillation: Secondary | ICD-10-CM | POA: Diagnosis not present

## 2014-02-12 DIAGNOSIS — G40909 Epilepsy, unspecified, not intractable, without status epilepticus: Secondary | ICD-10-CM | POA: Insufficient documentation

## 2014-02-12 DIAGNOSIS — M159 Polyosteoarthritis, unspecified: Secondary | ICD-10-CM | POA: Diagnosis not present

## 2014-02-12 DIAGNOSIS — R0602 Shortness of breath: Secondary | ICD-10-CM | POA: Diagnosis not present

## 2014-02-12 DIAGNOSIS — H35011 Changes in retinal vascular appearance, right eye: Secondary | ICD-10-CM | POA: Diagnosis present

## 2014-02-12 DIAGNOSIS — J42 Unspecified chronic bronchitis: Secondary | ICD-10-CM | POA: Diagnosis not present

## 2014-02-12 DIAGNOSIS — H43319 Vitreous membranes and strands, unspecified eye: Secondary | ICD-10-CM | POA: Insufficient documentation

## 2014-02-12 DIAGNOSIS — M109 Gout, unspecified: Secondary | ICD-10-CM | POA: Diagnosis not present

## 2014-02-12 DIAGNOSIS — N189 Chronic kidney disease, unspecified: Secondary | ICD-10-CM | POA: Diagnosis not present

## 2014-02-12 DIAGNOSIS — R000 Tachycardia, unspecified: Secondary | ICD-10-CM | POA: Diagnosis not present

## 2014-02-12 DIAGNOSIS — H2510 Age-related nuclear cataract, unspecified eye: Secondary | ICD-10-CM

## 2014-02-12 DIAGNOSIS — I059 Rheumatic mitral valve disease, unspecified: Secondary | ICD-10-CM | POA: Diagnosis not present

## 2014-02-12 DIAGNOSIS — E785 Hyperlipidemia, unspecified: Secondary | ICD-10-CM | POA: Insufficient documentation

## 2014-02-12 DIAGNOSIS — H4310 Vitreous hemorrhage, unspecified eye: Secondary | ICD-10-CM | POA: Insufficient documentation

## 2014-02-12 DIAGNOSIS — H35039 Hypertensive retinopathy, unspecified eye: Secondary | ICD-10-CM

## 2014-02-12 DIAGNOSIS — I1310 Hypertensive heart and chronic kidney disease without heart failure, with stage 1 through stage 4 chronic kidney disease, or unspecified chronic kidney disease: Secondary | ICD-10-CM | POA: Diagnosis not present

## 2014-02-12 DIAGNOSIS — H3560 Retinal hemorrhage, unspecified eye: Secondary | ICD-10-CM | POA: Diagnosis not present

## 2014-02-12 DIAGNOSIS — H4311 Vitreous hemorrhage, right eye: Secondary | ICD-10-CM

## 2014-02-12 DIAGNOSIS — Z8673 Personal history of transient ischemic attack (TIA), and cerebral infarction without residual deficits: Secondary | ICD-10-CM | POA: Insufficient documentation

## 2014-02-12 DIAGNOSIS — Z950 Presence of cardiac pacemaker: Secondary | ICD-10-CM | POA: Insufficient documentation

## 2014-02-12 DIAGNOSIS — I10 Essential (primary) hypertension: Secondary | ICD-10-CM

## 2014-02-12 DIAGNOSIS — H43819 Vitreous degeneration, unspecified eye: Secondary | ICD-10-CM

## 2014-02-12 DIAGNOSIS — H35049 Retinal micro-aneurysms, unspecified, unspecified eye: Secondary | ICD-10-CM | POA: Diagnosis not present

## 2014-02-12 HISTORY — DX: Presence of cardiac pacemaker: Z95.0

## 2014-02-12 HISTORY — PX: MEMBRANE PEEL: SHX5967

## 2014-02-12 HISTORY — DX: Calculus of kidney: N20.0

## 2014-02-12 HISTORY — PX: PARS PLANA VITRECTOMY: SHX2166

## 2014-02-12 LAB — CBC
HCT: 40.3 % (ref 36.0–46.0)
Hemoglobin: 13.5 g/dL (ref 12.0–15.0)
MCH: 30.1 pg (ref 26.0–34.0)
MCHC: 33.5 g/dL (ref 30.0–36.0)
MCV: 90 fL (ref 78.0–100.0)
Platelets: 191 10*3/uL (ref 150–400)
RBC: 4.48 MIL/uL (ref 3.87–5.11)
RDW: 13.4 % (ref 11.5–15.5)
WBC: 6.4 10*3/uL (ref 4.0–10.5)

## 2014-02-12 LAB — BASIC METABOLIC PANEL
Anion gap: 12 (ref 5–15)
BUN: 13 mg/dL (ref 6–23)
CO2: 24 mEq/L (ref 19–32)
CREATININE: 0.96 mg/dL (ref 0.50–1.10)
Calcium: 10.1 mg/dL (ref 8.4–10.5)
Chloride: 106 mEq/L (ref 96–112)
GFR calc non Af Amer: 52 mL/min — ABNORMAL LOW (ref >90)
GFR, EST AFRICAN AMERICAN: 61 mL/min — AB (ref >90)
GLUCOSE: 97 mg/dL (ref 70–99)
POTASSIUM: 4.5 meq/L (ref 3.7–5.3)
Sodium: 142 mEq/L (ref 137–147)

## 2014-02-12 LAB — PROTIME-INR
INR: 1.18 (ref 0.00–1.49)
Prothrombin Time: 15 seconds (ref 11.6–15.2)

## 2014-02-12 LAB — APTT: aPTT: 30 seconds (ref 24–37)

## 2014-02-12 SURGERY — PARS PLANA VITRECTOMY WITH 25 GAUGE
Anesthesia: General | Site: Eye | Laterality: Right

## 2014-02-12 MED ORDER — LABETALOL HCL 5 MG/ML IV SOLN: 5.0000 mg | INTRAVENOUS | Status: DC | PRN

## 2014-02-12 MED ORDER — PREDNISOLONE ACETATE 1 % OP SUSP
1.0000 [drp] | Freq: Four times a day (QID) | OPHTHALMIC | Status: DC
  Filled 2014-02-12: qty 1

## 2014-02-12 MED ORDER — ACETAZOLAMIDE SODIUM 500 MG IJ SOLR
500.0000 mg | Freq: Once | INTRAMUSCULAR | Status: AC
  Administered 2014-02-13: 500 mg via INTRAVENOUS
  Filled 2014-02-12: qty 500

## 2014-02-12 MED ORDER — POLYMYXIN B SULFATE 500000 UNITS IJ SOLR
INTRAMUSCULAR | Status: AC
  Filled 2014-02-12: qty 1

## 2014-02-12 MED ORDER — BUPIVACAINE HCL (PF) 0.75 % IJ SOLN
INTRAMUSCULAR | Status: DC | PRN
  Administered 2014-02-12: 20 mL

## 2014-02-12 MED ORDER — HYDROCODONE-ACETAMINOPHEN 5-325 MG PO TABS: 1.0000 | ORAL_TABLET | ORAL | Status: DC | PRN

## 2014-02-12 MED ORDER — BACITRACIN-POLYMYXIN B 500-10000 UNIT/GM OP OINT
TOPICAL_OINTMENT | OPHTHALMIC | Status: AC
  Filled 2014-02-12: qty 3.5

## 2014-02-12 MED ORDER — FAMOTIDINE 10 MG PO TABS
10.0000 mg | ORAL_TABLET | Freq: Every day | ORAL | Status: DC
  Filled 2014-02-12: qty 1

## 2014-02-12 MED ORDER — DILTIAZEM HCL ER BEADS 240 MG PO CP24: 360.0000 mg | ORAL_CAPSULE | Freq: Every day | ORAL | Status: DC

## 2014-02-12 MED ORDER — ONDANSETRON HCL 4 MG/2ML IJ SOLN
INTRAMUSCULAR | Status: DC | PRN
  Administered 2014-02-12: 4 mg via INTRAVENOUS

## 2014-02-12 MED ORDER — ONDANSETRON HCL 4 MG/2ML IJ SOLN: 4.0000 mg | Freq: Four times a day (QID) | INTRAMUSCULAR | Status: DC | PRN

## 2014-02-12 MED ORDER — DORZOLAMIDE HCL-TIMOLOL MAL 2-0.5 % OP SOLN
1.0000 [drp] | Freq: Every day | OPHTHALMIC | Status: DC
  Administered 2014-02-12: 1 [drp] via OPHTHALMIC
  Filled 2014-02-12: qty 10

## 2014-02-12 MED ORDER — NEOSTIGMINE METHYLSULFATE 10 MG/10ML IV SOLN
INTRAVENOUS | Status: AC
  Filled 2014-02-12: qty 1

## 2014-02-12 MED ORDER — LISINOPRIL 20 MG PO TABS
20.0000 mg | ORAL_TABLET | Freq: Every day | ORAL | Status: DC
  Filled 2014-02-12: qty 1

## 2014-02-12 MED ORDER — ALBUTEROL SULFATE (2.5 MG/3ML) 0.083% IN NEBU: 3.0000 mL | INHALATION_SOLUTION | Freq: Four times a day (QID) | RESPIRATORY_TRACT | Status: DC | PRN

## 2014-02-12 MED ORDER — SODIUM HYALURONATE 10 MG/ML IO SOLN
INTRAOCULAR | Status: DC | PRN
  Administered 2014-02-12: 0.85 mL via INTRAOCULAR

## 2014-02-12 MED ORDER — ATROPINE SULFATE 1 % OP SOLN
OPHTHALMIC | Status: AC
  Filled 2014-02-12: qty 2

## 2014-02-12 MED ORDER — LIDOCAINE HCL (CARDIAC) 20 MG/ML IV SOLN
INTRAVENOUS | Status: AC
  Filled 2014-02-12: qty 5

## 2014-02-12 MED ORDER — ONDANSETRON HCL 4 MG/2ML IJ SOLN
INTRAMUSCULAR | Status: AC
  Filled 2014-02-12: qty 2

## 2014-02-12 MED ORDER — DILTIAZEM HCL ER COATED BEADS 360 MG PO CP24
360.0000 mg | ORAL_CAPSULE | Freq: Every day | ORAL | Status: DC
  Filled 2014-02-12: qty 1

## 2014-02-12 MED ORDER — GENTAMICIN SULFATE 40 MG/ML IJ SOLN
INTRAMUSCULAR | Status: AC
  Filled 2014-02-12: qty 2

## 2014-02-12 MED ORDER — EPINEPHRINE HCL 1 MG/ML IJ SOLN
INTRAMUSCULAR | Status: AC
  Filled 2014-02-12: qty 1

## 2014-02-12 MED ORDER — BRIMONIDINE TARTRATE 0.2 % OP SOLN
1.0000 [drp] | Freq: Two times a day (BID) | OPHTHALMIC | Status: DC
  Filled 2014-02-12: qty 5

## 2014-02-12 MED ORDER — FENTANYL CITRATE 0.05 MG/ML IJ SOLN
INTRAMUSCULAR | Status: DC | PRN
Start: 2014-02-12 — End: 2014-02-12
  Administered 2014-02-12: 50 ug via INTRAVENOUS

## 2014-02-12 MED ORDER — PROPOFOL 10 MG/ML IV BOLUS
INTRAVENOUS | Status: AC
  Filled 2014-02-12: qty 20

## 2014-02-12 MED ORDER — FENTANYL CITRATE 0.05 MG/ML IJ SOLN: 25.0000 ug | INTRAMUSCULAR | Status: DC | PRN

## 2014-02-12 MED ORDER — ROCURONIUM BROMIDE 50 MG/5ML IV SOLN
INTRAVENOUS | Status: AC
  Filled 2014-02-12: qty 1

## 2014-02-12 MED ORDER — GLYCOPYRROLATE 0.2 MG/ML IJ SOLN
INTRAMUSCULAR | Status: DC | PRN
  Administered 2014-02-12: .8 mg via INTRAVENOUS

## 2014-02-12 MED ORDER — SODIUM CHLORIDE 0.45 % IV SOLN: INTRAVENOUS | Status: DC

## 2014-02-12 MED ORDER — FENTANYL CITRATE 0.05 MG/ML IJ SOLN
INTRAMUSCULAR | Status: AC
  Filled 2014-02-12: qty 5

## 2014-02-12 MED ORDER — NEOSTIGMINE METHYLSULFATE 10 MG/10ML IV SOLN
INTRAVENOUS | Status: DC | PRN
  Administered 2014-02-12: 5 mg via INTRAVENOUS

## 2014-02-12 MED ORDER — LIDOCAINE HCL 4 % MT SOLN
OROMUCOSAL | Status: DC | PRN
  Administered 2014-02-12: 4 mL via TOPICAL

## 2014-02-12 MED ORDER — ALLOPURINOL 100 MG PO TABS
100.0000 mg | ORAL_TABLET | Freq: Every day | ORAL | Status: DC
  Filled 2014-02-12: qty 1

## 2014-02-12 MED ORDER — TETRACAINE HCL 0.5 % OP SOLN
2.0000 [drp] | Freq: Once | OPHTHALMIC | Status: DC
Start: 2014-02-13 — End: 2014-02-13
  Filled 2014-02-12: qty 2

## 2014-02-12 MED ORDER — PROMETHAZINE HCL 25 MG/ML IJ SOLN: 6.2500 mg | INTRAMUSCULAR | Status: DC | PRN

## 2014-02-12 MED ORDER — DOCUSATE SODIUM 100 MG PO CAPS
100.0000 mg | ORAL_CAPSULE | Freq: Two times a day (BID) | ORAL | Status: DC
  Administered 2014-02-12: 100 mg via ORAL
  Filled 2014-02-12: qty 1

## 2014-02-12 MED ORDER — DEXAMETHASONE SODIUM PHOSPHATE 10 MG/ML IJ SOLN
INTRAMUSCULAR | Status: AC
  Filled 2014-02-12: qty 1

## 2014-02-12 MED ORDER — LIDOCAINE HCL (CARDIAC) 20 MG/ML IV SOLN
INTRAVENOUS | Status: DC | PRN
Start: 2014-02-12 — End: 2014-02-12
  Administered 2014-02-12: 40 mg via INTRAVENOUS

## 2014-02-12 MED ORDER — DEXAMETHASONE SODIUM PHOSPHATE 10 MG/ML IJ SOLN
INTRAMUSCULAR | Status: DC | PRN
  Administered 2014-02-12: 10 mg

## 2014-02-12 MED ORDER — ROCURONIUM BROMIDE 100 MG/10ML IV SOLN
INTRAVENOUS | Status: DC | PRN
  Administered 2014-02-12: 30 mg via INTRAVENOUS

## 2014-02-12 MED ORDER — EPHEDRINE SULFATE 50 MG/ML IJ SOLN
INTRAMUSCULAR | Status: AC
  Filled 2014-02-12: qty 1

## 2014-02-12 MED ORDER — WARFARIN - PHYSICIAN DOSING INPATIENT: Freq: Every day | Status: DC

## 2014-02-12 MED ORDER — BUPIVACAINE HCL (PF) 0.75 % IJ SOLN
INTRAMUSCULAR | Status: AC
  Filled 2014-02-12: qty 10

## 2014-02-12 MED ORDER — SIMVASTATIN 5 MG PO TABS
5.0000 mg | ORAL_TABLET | Freq: Every day | ORAL | Status: DC
Start: 2014-02-12 — End: 2014-02-13
  Administered 2014-02-12: 5 mg via ORAL
  Filled 2014-02-12 (×2): qty 1

## 2014-02-12 MED ORDER — 0.9 % SODIUM CHLORIDE (POUR BTL) OPTIME
TOPICAL | Status: DC | PRN
  Administered 2014-02-12: 1000 mL

## 2014-02-12 MED ORDER — PHENYLEPHRINE HCL 2.5 % OP SOLN
1.0000 [drp] | OPHTHALMIC | Status: DC | PRN
  Administered 2014-02-12 (×2): 1 [drp] via OPHTHALMIC
  Filled 2014-02-12: qty 2

## 2014-02-12 MED ORDER — TEMAZEPAM 15 MG PO CAPS: 15.0000 mg | ORAL_CAPSULE | Freq: Every evening | ORAL | Status: DC | PRN

## 2014-02-12 MED ORDER — MECLIZINE HCL 25 MG PO TABS
25.0000 mg | ORAL_TABLET | Freq: Three times a day (TID) | ORAL | Status: DC | PRN
  Filled 2014-02-12: qty 1

## 2014-02-12 MED ORDER — GATIFLOXACIN 0.5 % OP SOLN
1.0000 [drp] | OPHTHALMIC | Status: DC | PRN
  Administered 2014-02-12 (×2): 1 [drp] via OPHTHALMIC
  Filled 2014-02-12: qty 2.5

## 2014-02-12 MED ORDER — STERILE WATER FOR INJECTION IJ SOLN
INTRAMUSCULAR | Status: AC
  Filled 2014-02-12: qty 10

## 2014-02-12 MED ORDER — ACETAMINOPHEN 325 MG PO TABS: 325.0000 mg | ORAL_TABLET | ORAL | Status: DC | PRN

## 2014-02-12 MED ORDER — ENOXAPARIN SODIUM 80 MG/0.8ML ~~LOC~~ SOLN: 80.0000 mg | Freq: Two times a day (BID) | SUBCUTANEOUS | Status: DC

## 2014-02-12 MED ORDER — LATANOPROST 0.005 % OP SOLN
1.0000 [drp] | Freq: Every day | OPHTHALMIC | Status: DC
  Filled 2014-02-12: qty 2.5

## 2014-02-12 MED ORDER — CYCLOPENTOLATE HCL 1 % OP SOLN
1.0000 [drp] | OPHTHALMIC | Status: DC | PRN
  Administered 2014-02-12 (×2): 1 [drp] via OPHTHALMIC
  Filled 2014-02-12: qty 2

## 2014-02-12 MED ORDER — GATIFLOXACIN 0.5 % OP SOLN
1.0000 [drp] | Freq: Four times a day (QID) | OPHTHALMIC | Status: DC
  Filled 2014-02-12: qty 2.5

## 2014-02-12 MED ORDER — BACITRACIN-POLYMYXIN B 500-10000 UNIT/GM OP OINT
TOPICAL_OINTMENT | OPHTHALMIC | Status: DC | PRN
  Administered 2014-02-12: 1 via OPHTHALMIC

## 2014-02-12 MED ORDER — ATROPINE SULFATE 1 % OP SOLN
OPHTHALMIC | Status: DC | PRN
  Administered 2014-02-12: 1 [drp] via OPHTHALMIC

## 2014-02-12 MED ORDER — MORPHINE SULFATE 2 MG/ML IJ SOLN: 1.0000 mg | INTRAMUSCULAR | Status: DC | PRN

## 2014-02-12 MED ORDER — ENOXAPARIN SODIUM 80 MG/0.8ML ~~LOC~~ SOLN
80.0000 mg | Freq: Two times a day (BID) | SUBCUTANEOUS | Status: DC
  Administered 2014-02-13: 80 mg via SUBCUTANEOUS
  Filled 2014-02-12 (×3): qty 0.8

## 2014-02-12 MED ORDER — TROPICAMIDE 1 % OP SOLN
1.0000 [drp] | OPHTHALMIC | Status: DC | PRN
  Administered 2014-02-12 (×2): 1 [drp] via OPHTHALMIC
  Filled 2014-02-12: qty 3

## 2014-02-12 MED ORDER — MAGNESIUM HYDROXIDE 400 MG/5ML PO SUSP: 15.0000 mL | Freq: Four times a day (QID) | ORAL | Status: DC | PRN

## 2014-02-12 MED ORDER — SODIUM HYALURONATE 10 MG/ML IO SOLN
INTRAOCULAR | Status: AC
  Filled 2014-02-12: qty 0.85

## 2014-02-12 MED ORDER — BACITRACIN-POLYMYXIN B 500-10000 UNIT/GM OP OINT
1.0000 "application " | TOPICAL_OINTMENT | Freq: Four times a day (QID) | OPHTHALMIC | Status: DC
  Filled 2014-02-12: qty 3.5

## 2014-02-12 MED ORDER — WARFARIN SODIUM 2.5 MG PO TABS
2.5000 mg | ORAL_TABLET | Freq: Every day | ORAL | Status: DC
  Administered 2014-02-12: 2.5 mg via ORAL
  Filled 2014-02-12 (×2): qty 1

## 2014-02-12 MED ORDER — LABETALOL HCL 5 MG/ML IV SOLN
INTRAVENOUS | Status: AC
  Filled 2014-02-12: qty 4

## 2014-02-12 MED ORDER — POLYMYXIN B SULFATE 500000 UNITS IJ SOLR
INTRAMUSCULAR | Status: DC | PRN
  Administered 2014-02-12: 13:00:00

## 2014-02-12 MED ORDER — BSS PLUS IO SOLN
INTRAOCULAR | Status: AC
  Filled 2014-02-12: qty 500

## 2014-02-12 MED ORDER — BSS PLUS IO SOLN
INTRAOCULAR | Status: DC | PRN
  Administered 2014-02-12: 12:00:00

## 2014-02-12 MED ORDER — SODIUM CHLORIDE 0.9 % IJ SOLN
INTRAMUSCULAR | Status: AC
  Filled 2014-02-12: qty 10

## 2014-02-12 MED ORDER — PROPOFOL 10 MG/ML IV BOLUS
INTRAVENOUS | Status: DC | PRN
  Administered 2014-02-12: 100 mg via INTRAVENOUS

## 2014-02-12 MED ORDER — GLYCOPYRROLATE 0.2 MG/ML IJ SOLN
INTRAMUSCULAR | Status: AC
  Filled 2014-02-12: qty 4

## 2014-02-12 MED ORDER — EPHEDRINE SULFATE 50 MG/ML IJ SOLN
INTRAMUSCULAR | Status: DC | PRN
  Administered 2014-02-12: 5 mg via INTRAVENOUS

## 2014-02-12 MED ORDER — FUROSEMIDE 20 MG PO TABS
20.0000 mg | ORAL_TABLET | ORAL | Status: DC
  Filled 2014-02-12: qty 1

## 2014-02-12 MED ORDER — SODIUM CHLORIDE 0.9 % IV SOLN
INTRAVENOUS | Status: DC | PRN
  Administered 2014-02-12: 13:00:00 via INTRAVENOUS

## 2014-02-12 MED ORDER — ACETAMINOPHEN 325 MG PO TABS: 325.0000 mg | ORAL_TABLET | Freq: Four times a day (QID) | ORAL | Status: DC | PRN

## 2014-02-12 SURGICAL SUPPLY — 45 items
BALL CTTN LRG ABS STRL LF (GAUZE/BANDAGES/DRESSINGS) ×3
CANNULA VLV SOFT TIP 25G (OPHTHALMIC) ×1 IMPLANT
CANNULA VLV SOFT TIP 25GA (OPHTHALMIC) ×2 IMPLANT
COTTONBALL LRG STERILE PKG (GAUZE/BANDAGES/DRESSINGS) ×6 IMPLANT
COVER MAYO STAND STRL (DRAPES) ×2 IMPLANT
DRAPE INCISE 51X51 W/FILM STRL (DRAPES) ×2 IMPLANT
DRAPE OPHTHALMIC 77X100 STRL (CUSTOM PROCEDURE TRAY) ×2 IMPLANT
FORCEPS ECKARDT ILM 25G SERR (OPHTHALMIC RELATED) IMPLANT
FORCEPS GRIESHABER ILM 25G A (INSTRUMENTS) IMPLANT
GLOVE BIO SURGEON STRL SZ7 (GLOVE) ×1 IMPLANT
GLOVE SS BIOGEL STRL SZ 6.5 (GLOVE) ×1 IMPLANT
GLOVE SS BIOGEL STRL SZ 7 (GLOVE) ×1 IMPLANT
GLOVE SUPERSENSE BIOGEL SZ 6.5 (GLOVE) ×1
GLOVE SUPERSENSE BIOGEL SZ 7 (GLOVE) ×1
GLOVE SURG 8.5 LATEX PF (GLOVE) ×2 IMPLANT
GLOVE SURG SS PI 6.5 STRL IVOR (GLOVE) ×1 IMPLANT
GOWN STRL REUS W/ TWL LRG LVL3 (GOWN DISPOSABLE) ×3 IMPLANT
GOWN STRL REUS W/TWL LRG LVL3 (GOWN DISPOSABLE) ×6
HANDLE PNEUMATIC FOR CONSTEL (OPHTHALMIC) IMPLANT
KIT BASIN OR (CUSTOM PROCEDURE TRAY) ×2 IMPLANT
NDL 18GX1X1/2 (RX/OR ONLY) (NEEDLE) ×1 IMPLANT
NDL 25GX 5/8IN NON SAFETY (NEEDLE) ×1 IMPLANT
NDL FILTER BLUNT 18X1 1/2 (NEEDLE) ×1 IMPLANT
NDL HYPO 30X.5 LL (NEEDLE) ×1 IMPLANT
NEEDLE 18GX1X1/2 (RX/OR ONLY) (NEEDLE) ×2 IMPLANT
NEEDLE 25GX 5/8IN NON SAFETY (NEEDLE) ×2 IMPLANT
NEEDLE FILTER BLUNT 18X 1/2SAF (NEEDLE) ×1
NEEDLE FILTER BLUNT 18X1 1/2 (NEEDLE) ×1 IMPLANT
NEEDLE HYPO 30X.5 LL (NEEDLE) ×2 IMPLANT
NS IRRIG 1000ML POUR BTL (IV SOLUTION) ×2 IMPLANT
PACK VITRECTOMY CUSTOM (CUSTOM PROCEDURE TRAY) ×2 IMPLANT
PAD ARMBOARD 7.5X6 YLW CONV (MISCELLANEOUS) ×4 IMPLANT
PAK PIK VITRECTOMY CVS 25GA (OPHTHALMIC) ×2 IMPLANT
PIC ILLUMINATED 25G (OPHTHALMIC) ×2
PIK ILLUMINATED 25G (OPHTHALMIC) ×1 IMPLANT
PROBE LASER ILLUM FLEX CVD 25G (OPHTHALMIC) ×1 IMPLANT
ROLLS DENTAL (MISCELLANEOUS) ×4 IMPLANT
SPONGE SURGIFOAM ABS GEL 12-7 (HEMOSTASIS) ×2 IMPLANT
STOPCOCK 4 WAY LG BORE MALE ST (IV SETS) IMPLANT
SYR 20CC LL (SYRINGE) ×2 IMPLANT
SYR BULB 3OZ (MISCELLANEOUS) ×2 IMPLANT
SYR TB 1ML LUER SLIP (SYRINGE) ×2 IMPLANT
TOWEL OR 17X24 6PK STRL BLUE (TOWEL DISPOSABLE) ×6 IMPLANT
WATER STERILE IRR 1000ML POUR (IV SOLUTION) ×2 IMPLANT
WIPE INSTRUMENT VISIWIPE 73X73 (MISCELLANEOUS) ×2 IMPLANT

## 2014-02-12 NOTE — Progress Notes (Signed)
Drops finished by Dr. Zigmund Daniel.

## 2014-02-12 NOTE — Progress Notes (Signed)
PHARMACIST - PHYSICIAN COMMUNICATION  CONCERNING: Pharmacy Care Issues Regarding Warfarin Labs  RECOMMENDATION (Action Taken): A baseline and daily protime for three days has been ordered to meet the [facility] Patient safety goal and comply with the current [place].   The Pharmacy will defer all warfarin dose order changes and follow up of lab results to the prescriber unless an additional order to initiate a "pharmacy Coumadin consult" is placed.  DESCRIPTION:  While hospitalized, to be in compliance with The [facility] Patient Safety Goals, all patients on warfarin must have a baseline and/or current protime prior to the administration of warfarin. Pharmacy has received your order for warfarin without these required laboratory assessments.  Hildred Laser, Pharm D 02/12/2014 5:36 PM

## 2014-02-12 NOTE — Progress Notes (Signed)
Called for CPAP for post op patient in the PACU.  Placed on nasal mask with CPAP 5 with 6l O2 bleed in.  SpO2 95%.  Will continue to monitor.

## 2014-02-12 NOTE — Anesthesia Preprocedure Evaluation (Addendum)
Anesthesia Evaluation  Patient identified by MRN, date of birth, ID band Patient awake    Reviewed: Allergy & Precautions, H&P , NPO status , Patient's Chart, lab work & pertinent test results  Airway Mallampati: II TM Distance: >3 FB Neck ROM: Full    Dental  (+) Dental Advisory Given, Edentulous Upper, Missing,    Pulmonary shortness of breath and with exertion,          Cardiovascular hypertension, Pt. on medications + dysrhythmias Atrial Fibrillation + pacemaker + Valvular Problems/Murmurs (3/15 TTE: EF 65-70%. Mild/ Mod Mitral stenosis)     Neuro/Psych Seizures -,  CVA negative psych ROS   GI/Hepatic negative GI ROS, Neg liver ROS,   Endo/Other  Morbid obesity  Renal/GU CRFRenal disease  negative genitourinary   Musculoskeletal  (+) Arthritis -, Osteoarthritis,    Abdominal   Peds  Hematology  (+) anemia ,   Anesthesia Other Findings   Reproductive/Obstetrics negative OB ROS                         Anesthesia Physical Anesthesia Plan  ASA: III  Anesthesia Plan: General   Post-op Pain Management:    Induction: Intravenous  Airway Management Planned: Oral ETT  Additional Equipment: None  Intra-op Plan:   Post-operative Plan: Extubation in OR  Informed Consent: I have reviewed the patients History and Physical, chart, labs and discussed the procedure including the risks, benefits and alternatives for the proposed anesthesia with the patient or authorized representative who has indicated his/her understanding and acceptance.   Dental advisory given  Plan Discussed with: CRNA, Anesthesiologist and Surgeon  Anesthesia Plan Comments:         Anesthesia Quick Evaluation

## 2014-02-12 NOTE — H&P (Signed)
I examined the patient today and there is no change in the medical status 

## 2014-02-12 NOTE — Transfer of Care (Signed)
Immediate Anesthesia Transfer of Care Note  Patient: [name]  Procedure(s) Performed: Procedure(s): PARS PLANA VITRECTOMY WITH 25 GAUGE (Right) MEMBRANE PEEL (Right) AIR/FLUID EXCHANGE (Right)  Patient Location: PACU  Anesthesia Type:General  Level of Consciousness: awake, alert  and oriented  Airway & Oxygen Therapy: Patient Spontanous Breathing and Patient connected to face mask  Post-op Assessment: Report given to PACU RN, Post -op Vital signs reviewed and stable and Patient moving all extremities X 4  Post vital signs: Reviewed and stable  Complications: No apparent anesthesia complications

## 2014-02-12 NOTE — Progress Notes (Signed)
Spoke to Dr. Ola Spurr regarding pacemaker states no reprogramming needed.

## 2014-02-12 NOTE — Brief Op Note (Signed)
Brief Operative note   Preoperative diagnosis:  vitreous hemorrhage right eye Postoperative diagnosis  Post-Op Diagnosis Codes:    * Vitreous hemorrhage, right [379.23]  Procedures: Pars plana vitrectomy, laser, gas injection, right eye  Surgeon:  [name], MD...  Assistant:  Deatra Ina SA    Anesthesia: General  Specimen: none  Estimated blood loss:  1cc  Complications: none  Patient sent to PACU in good condition  Composed by [name] MD  Dictation number: [id number]

## 2014-02-12 NOTE — Progress Notes (Signed)
O2 sats dropping into 70's. Pt aggitated. Dr.Fitzgerald to bedside. Cpap applied. Pt reassured.Pt slid up in bed. BP also brought to MD attention w/ no new orders at this time

## 2014-02-12 NOTE — Anesthesia Procedure Notes (Signed)
Procedure Name: Intubation Date/Time: 02/12/2014 12:51 PM Performed by: Blair Heys E Pre-anesthesia Checklist: Patient identified, Emergency Drugs available, Suction available and Patient being monitored Patient Re-evaluated:Patient Re-evaluated prior to inductionOxygen Delivery Method: Circle system utilized Preoxygenation: Pre-oxygenation with 100% oxygen Intubation Type: IV induction Ventilation: Mask ventilation without difficulty and Oral airway inserted - appropriate to patient size Laryngoscope Size: Sabra Heck and 2 Grade View: Grade I Tube type: Oral Tube size: 7.5 mm Number of attempts: 1 Airway Equipment and Method: Stylet,  Oral airway and LTA kit utilized Placement Confirmation: ETT inserted through vocal cords under direct vision,  positive ETCO2 and breath sounds checked- equal and bilateral Secured at: 21 cm Tube secured with: Tape Dental Injury: Teeth and Oropharynx as per pre-operative assessment

## 2014-02-12 NOTE — Transfer of Care (Deleted)
Immediate Anesthesia Transfer of Care Note  Patient: [name]  Procedure(s) Performed: Procedure(s): PARS PLANA VITRECTOMY WITH 25 GAUGE (Right) MEMBRANE PEEL (Right) AIR/FLUID EXCHANGE (Right)  Patient Location: PACU  Anesthesia Type:General  Level of Consciousness: awake  Airway & Oxygen Therapy: Patient Spontanous Breathing and Patient connected to T-piece oxygen  Post-op Assessment: Report given to PACU RN, Post -op Vital signs reviewed and stable and Patient moving all extremities X 4  Post vital signs: Reviewed and stable  Complications: No apparent anesthesia complications

## 2014-02-12 NOTE — Progress Notes (Signed)
Dr. Tresa Moore at bedside states okay for pt to go to room at this time

## 2014-02-13 ENCOUNTER — Encounter (INDEPENDENT_AMBULATORY_CARE_PROVIDER_SITE_OTHER): Payer: Medicare Other | Admitting: Ophthalmology

## 2014-02-13 DIAGNOSIS — H4310 Vitreous hemorrhage, unspecified eye: Secondary | ICD-10-CM | POA: Diagnosis not present

## 2014-02-13 LAB — PROTIME-INR
INR: 1.19 (ref 0.00–1.49)
PROTHROMBIN TIME: 15.1 s (ref 11.6–15.2)

## 2014-02-13 MED ORDER — PREDNISOLONE ACETATE 1 % OP SUSP: 1.0000 [drp] | Freq: Four times a day (QID) | OPHTHALMIC | Status: DC

## 2014-02-13 MED ORDER — BACITRACIN-POLYMYXIN B 500-10000 UNIT/GM OP OINT: 1.0000 "application " | TOPICAL_OINTMENT | Freq: Four times a day (QID) | OPHTHALMIC | Status: DC

## 2014-02-13 MED ORDER — GATIFLOXACIN 0.5 % OP SOLN: 1.0000 [drp] | Freq: Four times a day (QID) | OPHTHALMIC | Status: DC

## 2014-02-13 NOTE — Discharge Summary (Signed)
Discharge summary not needed on [facility] patients per medical records. 

## 2014-02-13 NOTE — Progress Notes (Signed)
Pt. Discharge to home. Discharge instruction given to patient, husband, and daughter. No question verbalized.

## 2014-02-13 NOTE — Progress Notes (Signed)
02/13/2014, 7:05 AM  Mental Status:  Awake, Alert, Oriented  Anterior segment: Cornea  Clear    Anterior Chamber Clear    Lens:    IOL Intra Ocular Pressure 15 mmHg with Tonopen  Vitreous: Clear 70%gas bubble   Retina:  Attached Good laser reaction   Impression: Excellent result Retina attached   Final Diagnosis: Active Problems:   Retinal macroaneurysm of right eye   Vitreous hemorrhage   Plan: start post operative eye drops.  Discharge to home.  Give post operative instructions  [name] 02/13/2014, 7:05 AM

## 2014-02-13 NOTE — Anesthesia Postprocedure Evaluation (Signed)
  Anesthesia Post-op Note  Patient: [name]  Procedure(s) Performed: Procedure(s): PARS PLANA VITRECTOMY WITH 25 GAUGE (Right) MEMBRANE PEEL (Right) AIR/FLUID EXCHANGE (Right)  Patient Location: PACU  Anesthesia Type:General  Level of Consciousness: awake, alert  and oriented  Airway and Oxygen Therapy: Patient Spontanous Breathing  Post-op Pain: none  Post-op Assessment: Post-op Vital signs reviewed  Post-op Vital Signs: Reviewed  Last Vitals:  Filed Vitals:   02/13/14 0510  BP: 156/68  Pulse: 84  Temp: 36.8 C  Resp: 24    Complications: No apparent anesthesia complications

## 2014-02-13 NOTE — Op Note (Signed)
NAMEDEMIANA, AO NO.:  [id number]  MEDICAL RECORD NO.:  [id number]  LOCATION:  [id number]                        FACILITY:  [facility]  PHYSICIAN:  Chrystie Nose. Zigmund Daniel, M.D. DATE OF BIRTH:  08/29/1926  DATE OF PROCEDURE:  02/12/2014 DATE OF DISCHARGE:                              OPERATIVE REPORT   ADMISSION DIAGNOSIS:  Vitreous hemorrhage, right eye.  POSTOPERATIVE DIAGNOSIS:  Vitreous hemorrhage, right eye; arterial macroaneurysm, right eye; posterior membranes, right eye; subretinal hemorrhage, right eye.  PROCEDURES:  Pars plana vitrectomy with membrane peel, retinal photocoagulation, gas fluid exchange, all in the right eye.  SURGEON:  Chrystie Nose. Zigmund Daniel, M.D.  ASSISTANT:  Deatra Ina, SA.  ANESTHESIA:  General.  DETAILS:  Usual prep and drape, a 25-gauge trocars placed at 8:10 and 2 o'clock, infusion at 8 o'clock.  Pars plana vitrectomy was begun just behind the Pseudophakic Provisc was placed on the corneal surface.  It was noted that the INR was 1.1 prior to the procedure.  The vitrectomy was carried into the mid vitreous cavity where dark red blood was encountered.  The blood and vitreous was removed under low suction and rapid cutting down to the macular surface.  A thick layer of blood was seen on the macular surface.  This was lifted with the membranes that were containing the blood were lifted with the vitreous cutter and the blood was free from its entrapment on the retinal surface.  The large clot of blood was removed with the vitreous cutter.  It became apparent that an arterial macroaneurysm was present on the superior pole of the disk.  There was a 2 disc diameter area of subretinal hemorrhage, extending from the arterial macroaneurysm.  All surface retinal hemorrhage was removed with vitreous cutting and vitreous suction. The vitrectomy was carried into the mid periphery, where additional membranes were encountered. These were lifted with the  lighted pick and freed and removed with the vitreous cutter.  The vitrectomy was carried into the far periphery and the super wide biome viewing system was moved into place.  Blood was seen at 6 o'clock.  With scleral depression, this blood was removed with the vitreous cutter.  The endolaser was positioned in the eye.  The areas at 6 o'clock where blood was present were treated with the laser and the macroaneurysm was treated directly until it was sealed in white.  Some retinal surface laser was performed around the area of hemorrhage to wall off the subretinal hemorrhage area. The macula was clear.  Once this was accomplished, a 30% gas fluid exchange was carried out.  The instruments were removed from the eye and the wounds were tested.  They were found to be secure.  Once all trocars were removed and the wounds were secure, polymyxin and gentamicin were used to irrigate the tenon space.  Decadron 10 mg was injected into the lower subconjunctival space.  Marcaine was injected around the globe for postop pain.  Atropine solution was applied as well as Polysporin ophthalmic ointment to patch and shield.  The closing pressure was 10 with a Baer care tonometer. Complications none. Duration 1 hour.  The patient was awakened and taken to recovery in satisfactory  condition.     Chrystie Nose. Zigmund Daniel, M.D.     JDM/MEDQ  D:  02/12/2014  T:  02/13/2014  Job:  [id number]

## 2014-02-14 ENCOUNTER — Encounter (HOSPITAL_COMMUNITY): Payer: Self-pay | Admitting: Ophthalmology

## 2014-02-14 ENCOUNTER — Ambulatory Visit (INDEPENDENT_AMBULATORY_CARE_PROVIDER_SITE_OTHER): Payer: Medicare Other | Admitting: *Deleted

## 2014-02-14 DIAGNOSIS — Z5181 Encounter for therapeutic drug level monitoring: Secondary | ICD-10-CM

## 2014-02-14 DIAGNOSIS — I495 Sick sinus syndrome: Secondary | ICD-10-CM

## 2014-02-14 DIAGNOSIS — I6350 Cerebral infarction due to unspecified occlusion or stenosis of unspecified cerebral artery: Secondary | ICD-10-CM

## 2014-02-14 DIAGNOSIS — I639 Cerebral infarction, unspecified: Secondary | ICD-10-CM

## 2014-02-14 LAB — POCT INR: INR: 1.4

## 2014-02-15 ENCOUNTER — Ambulatory Visit (INDEPENDENT_AMBULATORY_CARE_PROVIDER_SITE_OTHER): Payer: Medicare Other | Admitting: *Deleted

## 2014-02-15 DIAGNOSIS — I6350 Cerebral infarction due to unspecified occlusion or stenosis of unspecified cerebral artery: Secondary | ICD-10-CM

## 2014-02-15 DIAGNOSIS — I639 Cerebral infarction, unspecified: Secondary | ICD-10-CM

## 2014-02-15 DIAGNOSIS — I495 Sick sinus syndrome: Secondary | ICD-10-CM

## 2014-02-15 DIAGNOSIS — Z5181 Encounter for therapeutic drug level monitoring: Secondary | ICD-10-CM

## 2014-02-15 LAB — POCT INR: INR: 1.5

## 2014-02-15 MED ORDER — ENOXAPARIN SODIUM 80 MG/0.8ML ~~LOC~~ SOLN: 80.0000 mg | Freq: Two times a day (BID) | SUBCUTANEOUS | Status: DC

## 2014-02-18 ENCOUNTER — Ambulatory Visit (INDEPENDENT_AMBULATORY_CARE_PROVIDER_SITE_OTHER): Payer: Medicare Other | Admitting: *Deleted

## 2014-02-18 DIAGNOSIS — I639 Cerebral infarction, unspecified: Secondary | ICD-10-CM

## 2014-02-18 DIAGNOSIS — Z5181 Encounter for therapeutic drug level monitoring: Secondary | ICD-10-CM

## 2014-02-18 DIAGNOSIS — I6350 Cerebral infarction due to unspecified occlusion or stenosis of unspecified cerebral artery: Secondary | ICD-10-CM

## 2014-02-18 DIAGNOSIS — I495 Sick sinus syndrome: Secondary | ICD-10-CM

## 2014-02-18 LAB — POCT INR: INR: 2.1

## 2014-02-20 ENCOUNTER — Inpatient Hospital Stay (INDEPENDENT_AMBULATORY_CARE_PROVIDER_SITE_OTHER): Payer: Medicare Other | Admitting: Ophthalmology

## 2014-02-20 DIAGNOSIS — I10 Essential (primary) hypertension: Secondary | ICD-10-CM

## 2014-02-20 DIAGNOSIS — H35039 Hypertensive retinopathy, unspecified eye: Secondary | ICD-10-CM

## 2014-03-04 ENCOUNTER — Ambulatory Visit (INDEPENDENT_AMBULATORY_CARE_PROVIDER_SITE_OTHER): Payer: Medicare Other | Admitting: *Deleted

## 2014-03-04 ENCOUNTER — Encounter: Payer: Self-pay | Admitting: Internal Medicine

## 2014-03-04 ENCOUNTER — Other Ambulatory Visit: Payer: Self-pay | Admitting: Adult Health

## 2014-03-04 DIAGNOSIS — I495 Sick sinus syndrome: Secondary | ICD-10-CM

## 2014-03-04 DIAGNOSIS — Z5181 Encounter for therapeutic drug level monitoring: Secondary | ICD-10-CM

## 2014-03-04 DIAGNOSIS — I639 Cerebral infarction, unspecified: Secondary | ICD-10-CM

## 2014-03-04 DIAGNOSIS — I6350 Cerebral infarction due to unspecified occlusion or stenosis of unspecified cerebral artery: Secondary | ICD-10-CM

## 2014-03-04 LAB — POCT INR: INR: 2.2

## 2014-03-04 LAB — MDC_IDC_ENUM_SESS_TYPE_REMOTE
Battery Remaining Percentage: 67 %
Date Time Interrogation Session: 20150928075242
Lead Channel Pacing Threshold Amplitude: 1 V
Lead Channel Sensing Intrinsic Amplitude: 12 mV
Lead Channel Setting Pacing Amplitude: 2.5 V
Lead Channel Setting Sensing Sensitivity: 2 mV
MDC IDC MSMT BATTERY REMAINING LONGEVITY: 86 mo
MDC IDC MSMT BATTERY VOLTAGE: 2.93 V
MDC IDC MSMT LEADCHNL RV IMPEDANCE VALUE: 460 Ohm
MDC IDC MSMT LEADCHNL RV PACING THRESHOLD PULSEWIDTH: 0.4 ms
MDC IDC PG SERIAL: 2323973
MDC IDC SET LEADCHNL RV PACING PULSEWIDTH: 0.4 ms
MDC IDC STAT BRADY RV PERCENT PACED: 59 %

## 2014-03-04 NOTE — Progress Notes (Signed)
Remote pacemaker transmission.   

## 2014-03-13 ENCOUNTER — Encounter (INDEPENDENT_AMBULATORY_CARE_PROVIDER_SITE_OTHER): Payer: Medicare Other | Admitting: Ophthalmology

## 2014-03-13 DIAGNOSIS — H3509 Other intraretinal microvascular abnormalities: Secondary | ICD-10-CM

## 2014-03-22 ENCOUNTER — Encounter: Payer: Self-pay | Admitting: Cardiology

## 2014-04-01 ENCOUNTER — Ambulatory Visit (INDEPENDENT_AMBULATORY_CARE_PROVIDER_SITE_OTHER): Payer: Medicare Other | Admitting: *Deleted

## 2014-04-01 DIAGNOSIS — I639 Cerebral infarction, unspecified: Secondary | ICD-10-CM

## 2014-04-01 DIAGNOSIS — I495 Sick sinus syndrome: Secondary | ICD-10-CM

## 2014-04-01 DIAGNOSIS — Z23 Encounter for immunization: Secondary | ICD-10-CM

## 2014-04-01 DIAGNOSIS — Z5181 Encounter for therapeutic drug level monitoring: Secondary | ICD-10-CM

## 2014-04-01 LAB — POCT INR: INR: 2.4

## 2014-04-29 ENCOUNTER — Ambulatory Visit (INDEPENDENT_AMBULATORY_CARE_PROVIDER_SITE_OTHER): Payer: Medicare Other | Admitting: *Deleted

## 2014-04-29 DIAGNOSIS — I495 Sick sinus syndrome: Secondary | ICD-10-CM

## 2014-04-29 DIAGNOSIS — Z5181 Encounter for therapeutic drug level monitoring: Secondary | ICD-10-CM

## 2014-04-29 DIAGNOSIS — I639 Cerebral infarction, unspecified: Secondary | ICD-10-CM

## 2014-04-29 LAB — POCT INR: INR: 2.2

## 2014-05-09 ENCOUNTER — Other Ambulatory Visit: Payer: Self-pay | Admitting: Adult Health

## 2014-05-09 ENCOUNTER — Other Ambulatory Visit: Payer: Self-pay | Admitting: Internal Medicine

## 2014-05-17 ENCOUNTER — Other Ambulatory Visit: Payer: Self-pay | Admitting: Adult Health

## 2014-05-22 ENCOUNTER — Encounter (INDEPENDENT_AMBULATORY_CARE_PROVIDER_SITE_OTHER): Payer: Medicare Other | Admitting: Ophthalmology

## 2014-05-22 DIAGNOSIS — I10 Essential (primary) hypertension: Secondary | ICD-10-CM

## 2014-05-22 DIAGNOSIS — H35031 Hypertensive retinopathy, right eye: Secondary | ICD-10-CM

## 2014-05-22 DIAGNOSIS — H43811 Vitreous degeneration, right eye: Secondary | ICD-10-CM

## 2014-05-22 DIAGNOSIS — H3509 Other intraretinal microvascular abnormalities: Secondary | ICD-10-CM

## 2014-06-05 ENCOUNTER — Ambulatory Visit (INDEPENDENT_AMBULATORY_CARE_PROVIDER_SITE_OTHER): Payer: Medicare Other | Admitting: *Deleted

## 2014-06-05 ENCOUNTER — Encounter: Payer: Self-pay | Admitting: Internal Medicine

## 2014-06-05 DIAGNOSIS — I495 Sick sinus syndrome: Secondary | ICD-10-CM

## 2014-06-05 LAB — MDC_IDC_ENUM_SESS_TYPE_REMOTE
Battery Voltage: 2.93 V
Brady Statistic RV Percent Paced: 55 %
Lead Channel Impedance Value: 460 Ohm
Lead Channel Setting Pacing Pulse Width: 0.4 ms
MDC IDC MSMT BATTERY REMAINING LONGEVITY: 86 mo
MDC IDC MSMT BATTERY REMAINING PERCENTAGE: 67 %
MDC IDC MSMT LEADCHNL RV SENSING INTR AMPL: 12 mV
MDC IDC PG SERIAL: 2323973
MDC IDC SESS DTM: 20151230082427
MDC IDC SET LEADCHNL RV PACING AMPLITUDE: 2.5 V
MDC IDC SET LEADCHNL RV SENSING SENSITIVITY: 2 mV

## 2014-06-05 NOTE — Progress Notes (Signed)
Remote pacemaker transmission.   

## 2014-06-10 ENCOUNTER — Encounter: Payer: Self-pay | Admitting: Cardiology

## 2014-06-10 ENCOUNTER — Ambulatory Visit (INDEPENDENT_AMBULATORY_CARE_PROVIDER_SITE_OTHER): Payer: Private Health Insurance - Indemnity | Admitting: *Deleted

## 2014-06-10 DIAGNOSIS — I639 Cerebral infarction, unspecified: Secondary | ICD-10-CM

## 2014-06-10 DIAGNOSIS — I495 Sick sinus syndrome: Secondary | ICD-10-CM

## 2014-06-10 DIAGNOSIS — Z5181 Encounter for therapeutic drug level monitoring: Secondary | ICD-10-CM

## 2014-06-10 LAB — POCT INR: INR: 2.1

## 2014-06-11 ENCOUNTER — Encounter: Payer: Self-pay | Admitting: Cardiology

## 2014-06-25 ENCOUNTER — Other Ambulatory Visit: Payer: Self-pay | Admitting: Cardiovascular Disease

## 2014-07-11 ENCOUNTER — Emergency Department (HOSPITAL_COMMUNITY)
Admission: EM | Admit: 2014-07-11 | Discharge: 2014-07-12 | Disposition: A | Discharge status: Home / Self Care | Payer: Medicare HMO | Attending: Emergency Medicine | Admitting: Emergency Medicine

## 2014-07-11 ENCOUNTER — Emergency Department (HOSPITAL_COMMUNITY): Payer: Medicare HMO

## 2014-07-11 ENCOUNTER — Encounter (HOSPITAL_COMMUNITY): Payer: Self-pay | Admitting: *Deleted

## 2014-07-11 DIAGNOSIS — R351 Nocturia: Secondary | ICD-10-CM | POA: Diagnosis not present

## 2014-07-11 DIAGNOSIS — M109 Gout, unspecified: Secondary | ICD-10-CM | POA: Diagnosis not present

## 2014-07-11 DIAGNOSIS — I481 Persistent atrial fibrillation: Secondary | ICD-10-CM | POA: Insufficient documentation

## 2014-07-11 DIAGNOSIS — E785 Hyperlipidemia, unspecified: Secondary | ICD-10-CM | POA: Diagnosis not present

## 2014-07-11 DIAGNOSIS — Z862 Personal history of diseases of the blood and blood-forming organs and certain disorders involving the immune mechanism: Secondary | ICD-10-CM | POA: Insufficient documentation

## 2014-07-11 DIAGNOSIS — Z7901 Long term (current) use of anticoagulants: Secondary | ICD-10-CM | POA: Diagnosis not present

## 2014-07-11 DIAGNOSIS — Z8709 Personal history of other diseases of the respiratory system: Secondary | ICD-10-CM | POA: Diagnosis not present

## 2014-07-11 DIAGNOSIS — R42 Dizziness and giddiness: Secondary | ICD-10-CM | POA: Diagnosis not present

## 2014-07-11 DIAGNOSIS — Z87442 Personal history of urinary calculi: Secondary | ICD-10-CM | POA: Insufficient documentation

## 2014-07-11 DIAGNOSIS — I129 Hypertensive chronic kidney disease with stage 1 through stage 4 chronic kidney disease, or unspecified chronic kidney disease: Secondary | ICD-10-CM | POA: Diagnosis not present

## 2014-07-11 DIAGNOSIS — N189 Chronic kidney disease, unspecified: Secondary | ICD-10-CM | POA: Insufficient documentation

## 2014-07-11 DIAGNOSIS — Z950 Presence of cardiac pacemaker: Secondary | ICD-10-CM | POA: Diagnosis not present

## 2014-07-11 DIAGNOSIS — G4710 Hypersomnia, unspecified: Secondary | ICD-10-CM | POA: Diagnosis not present

## 2014-07-11 DIAGNOSIS — Z79899 Other long term (current) drug therapy: Secondary | ICD-10-CM | POA: Diagnosis not present

## 2014-07-11 DIAGNOSIS — R05 Cough: Secondary | ICD-10-CM | POA: Insufficient documentation

## 2014-07-11 DIAGNOSIS — Z792 Long term (current) use of antibiotics: Secondary | ICD-10-CM | POA: Insufficient documentation

## 2014-07-11 DIAGNOSIS — Z8673 Personal history of transient ischemic attack (TIA), and cerebral infarction without residual deficits: Secondary | ICD-10-CM | POA: Insufficient documentation

## 2014-07-11 DIAGNOSIS — R531 Weakness: Secondary | ICD-10-CM | POA: Diagnosis present

## 2014-07-11 LAB — CBC WITH DIFFERENTIAL/PLATELET
BASOS ABS: 0 10*3/uL (ref 0.0–0.1)
BASOS PCT: 0 % (ref 0–1)
Eosinophils Absolute: 0.1 10*3/uL (ref 0.0–0.7)
Eosinophils Relative: 1 % (ref 0–5)
HCT: 39.1 % (ref 36.0–46.0)
Hemoglobin: 12.6 g/dL (ref 12.0–15.0)
LYMPHS ABS: 2.5 10*3/uL (ref 0.7–4.0)
Lymphocytes Relative: 29 % (ref 12–46)
MCH: 29.2 pg (ref 26.0–34.0)
MCHC: 32.2 g/dL (ref 30.0–36.0)
MCV: 90.7 fL (ref 78.0–100.0)
Monocytes Absolute: 1.1 10*3/uL — ABNORMAL HIGH (ref 0.1–1.0)
Monocytes Relative: 13 % — ABNORMAL HIGH (ref 3–12)
Neutro Abs: 4.9 10*3/uL (ref 1.7–7.7)
Neutrophils Relative %: 57 % (ref 43–77)
Platelets: 167 10*3/uL (ref 150–400)
RBC: 4.31 MIL/uL (ref 3.87–5.11)
RDW: 12.8 % (ref 11.5–15.5)
WBC: 8.6 10*3/uL (ref 4.0–10.5)

## 2014-07-11 LAB — URINALYSIS, ROUTINE W REFLEX MICROSCOPIC
Bilirubin Urine: NEGATIVE
Glucose, UA: NEGATIVE mg/dL
Ketones, ur: NEGATIVE mg/dL
Leukocytes, UA: NEGATIVE
Nitrite: NEGATIVE
Protein, ur: NEGATIVE mg/dL
Specific Gravity, Urine: 1.015 (ref 1.005–1.030)
Urobilinogen, UA: 1 mg/dL (ref 0.0–1.0)
pH: 6 (ref 5.0–8.0)

## 2014-07-11 LAB — URINE MICROSCOPIC-ADD ON

## 2014-07-11 NOTE — ED Provider Notes (Signed)
CSN: [id number]     Arrival date & time 07/11/14  1846 History  This chart was scribed for [name], MD by Edison Simon, ED Scribe. This patient was seen in room APOTF/OTF and the patient's care was started at 11:19 PM.    Chief Complaint  Patient presents with  . Weakness   The history is provided by the patient, the spouse and a relative. No language interpreter was used.    HPI Comments: [name] is a 81 y.o. female who presents to the Emergency Department complaining of dizziness and increased somnolence with onset 3 weeks ago. Husband states symptoms began when she felt dizzy after jumping into bed one night 3 weeks ago, described as if the bed was spinning. He states she has had similar symptoms before and is prescribed Meclizine. She states she used Meclizine BID for 3 days,and dizzy episodes have resolved. She also reports increased sleepiness and generalized weakness. She states that her eyes "feel sleepy" and that she feels "crazy all over." She states she has been coughing for the past week and has had increased nocturia. He states she does not smoke or drink. He states she ambulates with a cane. He states she has not been ambulating well since she had a "minor stroke" 1 year ago, but has not had any recent changes. She denies headache, nausea, vomiting, diarrhea, or vision changes. They deny any change in her medication.   PCP Dr Orson Ape   Past Medical History  Diagnosis Date  . Tachycardia-bradycardia syndrome     with pauses and syncope;PPM implantation 10/2008,negative stress nuclear study 03/2005  . Atrial fibrillation     persistant  . Mitral valve disease     Severe mitral annular calcification; mild to moderate stenosis-not clearly rheumatic  . Hypertension     heart disease diastolic dysfunction ; 123456; mild pulmonary edema in 2006;responded to diurectics ; LVH  . Hyperlipidemia   . Angioedema 08/2006  . Anemia     H/H of 11.3/36.7 in 12/2008 ;normal MCV;  normal CBC in 2011  . Gout   . 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 Vertigo   . Chronic bronchitis   . Blood transfusion   . DJD (degenerative joint disease)     hands, knees  . Seizure disorder   . Stroke   . Retinal hemorrhage   . Shortness of breath     only with exertion  . Pacemaker   . Chronic renal insufficiency     baseline creatine 1.4; 1.04 in 03/2010  . Nephrolithiasis   . Kidney stones    Past Surgical History  Procedure Laterality Date  . Insert / replace / remove pacemaker  11/2008    St.Jude DUAL chamber pacemaker 11/06/2008  . Total knee arthroplasty  04/2009    Right  . Cataract extraction w/phaco  03/29/2011    Procedure: CATARACT EXTRACTION PHACO AND INTRAOCULAR LENS PLACEMENT (IOC);  Surgeon: Tonny Branch;  Location: [facility];  Service: Ophthalmology;  Laterality: Right;  CDE 12.62  . Pars plana vitrectomy Right 02/12/2014    Procedure: PARS PLANA VITRECTOMY WITH 25 GAUGE;  Surgeon: [name], MD;  Location: [facility];  Service: Ophthalmology;  Laterality: Right;  . Membrane peel Right 02/12/2014    Procedure: MEMBRANE PEEL;  Surgeon: Jenny Reichmann  Eber Jones, MD;  Location: [facility];  Service: Ophthalmology;  Laterality: Right;   Family History  Problem Relation Age of Onset  . Hypotension Neg Hx   . Anesthesia problems Neg Hx   . Pseudochol deficiency Neg Hx   . Malignant hyperthermia Neg Hx   . Cancer Other     unknown cancer   History  Substance Use Topics  . Smoking status: Never Smoker   . Smokeless tobacco: Never Used  . Alcohol Use: No   OB History    No data available     Review of Systems  Eyes: Negative for visual disturbance.  Respiratory: Positive for cough.   Gastrointestinal: Negative for nausea, vomiting and diarrhea.  Genitourinary:        Nocturia  Neurological: Positive for dizziness and weakness. Negative for headaches.  Psychiatric/Behavioral:       Somnolent  All other systems reviewed and are negative.     Allergies  Review of patient's allergies indicates no known allergies.  Home Medications   Prior to Admission medications   Medication Sig Start Date End Date Taking? Authorizing Provider  acetaminophen (TYLENOL) 325 MG tablet Take 325 mg by mouth every 6 (six) hours as needed for moderate pain.    Historical Provider, MD  albuterol (PROVENTIL HFA;VENTOLIN HFA) 108 (90 BASE) MCG/ACT inhaler Inhale 2 puffs into the lungs every 6 (six) hours as needed for wheezing or shortness of breath.    Historical Provider, MD  allopurinol (ZYLOPRIM) 100 MG tablet Take 100 mg by mouth daily.      Historical Provider, MD  bacitracin-polymyxin b (POLYSPORIN) ophthalmic ointment Place 1 application into the right eye 4 (four) times daily. apply to eye every 12 hours while awake 02/13/14   [name], MD  diltiazem (TIAZAC) 360 MG 24 hr capsule TAKE ONE CAPSULE BY MOUTH ONCE DAILY. 06/26/14   Herminio Commons, MD  dorzolamide-timolol (COSOPT) 22.3-6.8 MG/ML ophthalmic solution Place 1 drop into the left eye at bedtime.     Historical Provider, MD  enoxaparin (LOVENOX) 80 MG/0.8ML injection Inject 0.8 mLs (80 mg total) into the skin every 12 (twelve) hours. 02/15/14   Arnoldo Lenis, MD  furosemide (LASIX) 20 MG tablet TAKE ONE TABLET BY MOUTH ONCE DAILY. 05/09/14   Lendon Colonel, NP  gatifloxacin (ZYMAXID) 0.5 % SOLN Place 1 drop into the right eye 4 (four) times daily. 02/13/14   [name], MD  lisinopril (PRINIVIL,ZESTRIL) 20 MG tablet TAKE ONE TABLET BY MOUTH ONCE DAILY. 05/09/14   Herminio Commons, MD  meclizine (ANTIVERT) 25 MG tablet Take 25 mg by mouth 3 (three) times daily as needed. Dizziness 11/11/10   Imogene Burn, PA-C  pravastatin (PRAVACHOL) 80 MG tablet Take 80 mg by mouth at bedtime.    Historical  Provider, MD  prednisoLONE acetate (PRED FORTE) 1 % ophthalmic suspension Place 1 drop into the right eye 4 (four) times daily. 02/13/14   [name], MD  ranitidine (ZANTAC) 150 MG tablet Take 150 mg by mouth at bedtime.     Historical Provider, MD  warfarin (COUMADIN) 5 MG tablet TAKE 1/2 TABLET BY MOUTH DAILY OR AS DIRECTED. 05/20/14   Herminio Commons, MD   BP 120/106 mmHg  Pulse 85  Temp(Src) 97.9 F (36.6 C) (Oral)  Resp 18  Ht 5\' 1"  (1.549 m)  Wt 180 lb (81.647 kg)  BMI 34.03 kg/m2  SpO2 98%  Vital signs normal   Physical Exam  Constitutional: She is oriented to person, place, and time. She appears well-developed and well-nourished.  Non-toxic appearance. She does not appear ill. No distress.  HENT:  Head: Normocephalic and atraumatic.  Right Ear: External ear normal.  Left Ear: External ear normal.  Nose: Nose normal. No mucosal edema or rhinorrhea.  Mouth/Throat: Oropharynx is clear and moist and mucous membranes are normal. No dental abscesses or uvula swelling.  Eyes: Conjunctivae and EOM are normal. Pupils are equal, round, and reactive to light.  Neck: Normal range of motion and full passive range of motion without pain. Neck supple.  Cardiovascular: Normal rate, regular rhythm and normal heart sounds.  Exam reveals no gallop and no friction rub.   No murmur heard. Pulmonary/Chest: Effort normal and breath sounds normal. No respiratory distress. She has no wheezes. She has no rhonchi. She has no rales. She exhibits no tenderness and no crepitus.  Abdominal: Soft. Normal appearance and bowel sounds are normal. She exhibits no distension. There is no tenderness. There is no rebound and no guarding.  Musculoskeletal: Normal range of motion. She exhibits no edema or tenderness.  Moves all extremities well.   Neurological: She is alert and oriented to person, place, and time. She has normal strength. No cranial nerve deficit.  No pronator drift Grip strength  equal Motor strength equal  Skin: Skin is warm, dry and intact. No rash noted. No erythema. No pallor.  Psychiatric: She has a normal mood and affect. Her speech is normal and behavior is normal. Her mood appears not anxious.  Nursing note and vitals reviewed.   ED Course  Procedures (including critical care time)  DIAGNOSTIC STUDIES: Oxygen Saturation is 99% on room air, normal by my interpretation.    COORDINATION OF CARE: 11:31 PM Discussed treatment plan with patient at beside, the patient agrees with the plan and has no further questions at this time.   Patient and family were given her test results. They are relieved she has not had another stroke. Patient is discharged follow up with her own primary care doctor to further evaluate her sleepiness.  Labs Review Results for orders placed or performed during the hospital encounter of 07/11/14  CBC with Differential  Result Value Ref Range   WBC 8.6 4.0 - 10.5 K/uL   RBC 4.31 3.87 - 5.11 MIL/uL   Hemoglobin 12.6 12.0 - 15.0 g/dL   HCT 39.1 36.0 - 46.0 %   MCV 90.7 78.0 - 100.0 fL   MCH 29.2 26.0 - 34.0 pg   MCHC 32.2 30.0 - 36.0 g/dL   RDW 12.8 11.5 - 15.5 %   Platelets 167 150 - 400 K/uL   Neutrophils Relative % 57 43 - 77 %   Neutro Abs 4.9 1.7 - 7.7 K/uL   Lymphocytes Relative 29 12 - 46 %   Lymphs Abs 2.5 0.7 - 4.0 K/uL   Monocytes Relative 13 (H) 3 - 12 %   Monocytes Absolute 1.1 (H) 0.1 - 1.0 K/uL   Eosinophils Relative 1 0 - 5 %   Eosinophils Absolute 0.1 0.0 - 0.7 K/uL   Basophils Relative 0 0 - 1 %   Basophils Absolute 0.0 0.0 - 0.1 K/uL  Comprehensive metabolic panel  Result Value Ref Range   Sodium 138 135 - 145 mmol/L   Potassium 4.2 3.5 - 5.1 mmol/L   Chloride 105 96 - 112 mmol/L   CO2 27 19 - 32 mmol/L   Glucose, Bld 111 (H) 70 - 99 mg/dL  BUN 19 6 - 23 mg/dL   Creatinine, Ser 1.12 (H) 0.50 - 1.10 mg/dL   Calcium 9.7 8.4 - 10.5 mg/dL   Total Protein 7.3 6.0 - 8.3 g/dL   Albumin 3.5 3.5 - 5.2 g/dL    AST 16 0 - 37 U/L   ALT 11 0 - 35 U/L   Alkaline Phosphatase 77 39 - 117 U/L   Total Bilirubin 0.6 0.3 - 1.2 mg/dL   GFR calc non Af Amer 44 (L) >90 mL/min   GFR calc Af Amer 50 (L) >90 mL/min   Anion gap 6 5 - 15  Urinalysis, Routine w reflex microscopic  Result Value Ref Range   Color, Urine YELLOW YELLOW   APPearance CLEAR CLEAR   Specific Gravity, Urine 1.015 1.005 - 1.030   pH 6.0 5.0 - 8.0   Glucose, UA NEGATIVE NEGATIVE mg/dL   Hgb urine dipstick TRACE (A) NEGATIVE   Bilirubin Urine NEGATIVE NEGATIVE   Ketones, ur NEGATIVE NEGATIVE mg/dL   Protein, ur NEGATIVE NEGATIVE mg/dL   Urobilinogen, UA 1.0 0.0 - 1.0 mg/dL   Nitrite NEGATIVE NEGATIVE   Leukocytes, UA NEGATIVE NEGATIVE  Urine microscopic-add on  Result Value Ref Range   Squamous Epithelial / LPF RARE RARE   WBC, UA 0-2 <3 WBC/hpf   RBC / HPF 0-2 <3 RBC/hpf   Bacteria, UA FEW (A) RARE    Laboratory interpretation all normal   Imaging Review Ct Head Wo Contrast  07/12/2014   CLINICAL DATA:  Dizziness and weakness for 1 day.  EXAM: CT HEAD WITHOUT CONTRAST  TECHNIQUE: Contiguous axial images were obtained from the base of the skull through the vertex without intravenous contrast.  COMPARISON:  Head CT 01/20/2014, 08/19/2013  FINDINGS: No intracranial hemorrhage, mass effect, or midline shift. No hydrocephalus. The basilar cisterns are patent. No evidence of territorial infarct. No intracranial fluid collection. Stable mild atrophy and chronic small vessel ischemic change. Remote left cerebellar infarct and bilateral lacunar infarcts in the basal ganglia, right greater than left, unchanged from prior. Calvarium is intact. There is mucosal thickening in the right maxillary sinus. Paranasal sinuses are otherwise well-aerated. The mastoid air cells are clear.  IMPRESSION: 1. No acute intracranial abnormality. 2. Chronic small vessel ischemic change remote prior lacunar infarcts, stable. 3. Right maxillary paranasal sinus  disease.   Electronically Signed   By: Jeb Levering M.D.   On: 07/12/2014 01:40     EKG Interpretation None      MDM   Final diagnoses:  Excessive sleepiness    Plan discharge   Rolland Porter, MD, FACEP  I personally performed the services described in this documentation, which was scribed in my presence. The recorded information has been reviewed and considered.  Rolland Porter, MD, Abram Sander   [name], MD 07/12/14 [phone]

## 2014-07-11 NOTE — ED Notes (Signed)
Has felt weak and sleeping more than usual for 2 weeks.  No pain

## 2014-07-12 LAB — COMPREHENSIVE METABOLIC PANEL
ALT: 11 U/L (ref 0–35)
ANION GAP: 6 (ref 5–15)
AST: 16 U/L (ref 0–37)
Albumin: 3.5 g/dL (ref 3.5–5.2)
Alkaline Phosphatase: 77 U/L (ref 39–117)
BUN: 19 mg/dL (ref 6–23)
CO2: 27 mmol/L (ref 19–32)
CREATININE: 1.12 mg/dL — AB (ref 0.50–1.10)
Calcium: 9.7 mg/dL (ref 8.4–10.5)
Chloride: 105 mmol/L (ref 96–112)
GFR calc non Af Amer: 44 mL/min — ABNORMAL LOW (ref >90)
GFR, EST AFRICAN AMERICAN: 50 mL/min — AB (ref >90)
GLUCOSE: 111 mg/dL — AB (ref 70–99)
Potassium: 4.2 mmol/L (ref 3.5–5.1)
SODIUM: 138 mmol/L (ref 135–145)
Total Bilirubin: 0.6 mg/dL (ref 0.3–1.2)
Total Protein: 7.3 g/dL (ref 6.0–8.3)

## 2014-07-12 NOTE — Discharge Instructions (Signed)
Follow up with your doctor if you continue to feel sleepy. Return to the ED if you feel worse such as chest pain, struggling to breathe, fever, headache.

## 2014-07-24 ENCOUNTER — Ambulatory Visit (INDEPENDENT_AMBULATORY_CARE_PROVIDER_SITE_OTHER): Payer: Private Health Insurance - Indemnity | Admitting: *Deleted

## 2014-07-24 DIAGNOSIS — I639 Cerebral infarction, unspecified: Secondary | ICD-10-CM

## 2014-07-24 DIAGNOSIS — Z5181 Encounter for therapeutic drug level monitoring: Secondary | ICD-10-CM

## 2014-07-24 DIAGNOSIS — I495 Sick sinus syndrome: Secondary | ICD-10-CM

## 2014-07-24 LAB — POCT INR: INR: 2.4

## 2014-09-04 ENCOUNTER — Ambulatory Visit (INDEPENDENT_AMBULATORY_CARE_PROVIDER_SITE_OTHER): Payer: Medicare HMO | Admitting: *Deleted

## 2014-09-04 DIAGNOSIS — I639 Cerebral infarction, unspecified: Secondary | ICD-10-CM | POA: Diagnosis not present

## 2014-09-04 DIAGNOSIS — Z5181 Encounter for therapeutic drug level monitoring: Secondary | ICD-10-CM | POA: Diagnosis not present

## 2014-09-04 DIAGNOSIS — I495 Sick sinus syndrome: Secondary | ICD-10-CM

## 2014-09-04 LAB — POCT INR: INR: 1.7

## 2014-09-05 ENCOUNTER — Ambulatory Visit (INDEPENDENT_AMBULATORY_CARE_PROVIDER_SITE_OTHER): Payer: Medicare HMO | Admitting: *Deleted

## 2014-09-05 DIAGNOSIS — I495 Sick sinus syndrome: Secondary | ICD-10-CM

## 2014-09-05 NOTE — Progress Notes (Signed)
Remote pacemaker transmission.   

## 2014-09-06 DIAGNOSIS — I495 Sick sinus syndrome: Secondary | ICD-10-CM | POA: Diagnosis not present

## 2014-09-06 LAB — MDC_IDC_ENUM_SESS_TYPE_REMOTE
Brady Statistic RV Percent Paced: 54 %
Implantable Pulse Generator Model: 2210
Implantable Pulse Generator Serial Number: 2323973
Lead Channel Sensing Intrinsic Amplitude: 12 mV
Lead Channel Setting Pacing Amplitude: 2.5 V
Lead Channel Setting Pacing Pulse Width: 0.4 ms
Lead Channel Setting Sensing Sensitivity: 2 mV

## 2014-09-11 ENCOUNTER — Encounter: Payer: Self-pay | Admitting: Cardiology

## 2014-09-18 ENCOUNTER — Ambulatory Visit (INDEPENDENT_AMBULATORY_CARE_PROVIDER_SITE_OTHER): Payer: Medicare HMO | Admitting: Internal Medicine

## 2014-09-18 ENCOUNTER — Encounter: Payer: Self-pay | Admitting: Internal Medicine

## 2014-09-18 VITALS — BP 130/60 | HR 62 | Ht 62.0 in | Wt 190.0 lb

## 2014-09-18 DIAGNOSIS — I482 Chronic atrial fibrillation, unspecified: Secondary | ICD-10-CM

## 2014-09-18 DIAGNOSIS — Z950 Presence of cardiac pacemaker: Secondary | ICD-10-CM

## 2014-09-18 DIAGNOSIS — I10 Essential (primary) hypertension: Secondary | ICD-10-CM

## 2014-09-18 DIAGNOSIS — I495 Sick sinus syndrome: Secondary | ICD-10-CM

## 2014-09-18 LAB — MDC_IDC_ENUM_SESS_TYPE_INCLINIC
Battery Remaining Longevity: 93.6 mo
Brady Statistic RA Percent Paced: 0 %
Brady Statistic RV Percent Paced: 54 %
Date Time Interrogation Session: 20160413110027
Implantable Pulse Generator Model: 2210
Implantable Pulse Generator Serial Number: 2323973
Lead Channel Pacing Threshold Pulse Width: 0.4 ms
Lead Channel Setting Sensing Sensitivity: 2 mV
MDC IDC MSMT BATTERY VOLTAGE: 2.92 V
MDC IDC MSMT LEADCHNL RV IMPEDANCE VALUE: 462.5 Ohm
MDC IDC MSMT LEADCHNL RV PACING THRESHOLD AMPLITUDE: 0.75 V
MDC IDC MSMT LEADCHNL RV SENSING INTR AMPL: 12 mV
MDC IDC SET LEADCHNL RV PACING AMPLITUDE: 2.5 V
MDC IDC SET LEADCHNL RV PACING PULSEWIDTH: 0.4 ms

## 2014-09-18 NOTE — Patient Instructions (Addendum)
Your physician wants you to follow-up in: 1 year with Dr. Lovena Le. You will receive a reminder letter in the mail two months in advance. If you don't receive a letter, please call our office to schedule the follow-up appointment.  Your physician recommends that you continue on your current medications as directed. Please refer to the Current Medication list given to you today.   Remote monitoring is used to monitor your Pacemaker of ICD from home. This monitoring reduces the number of office visits required to check your device to one time per year. It allows [place] to keep an eye on the functioning of your device to ensure it is working properly. You are scheduled for a device check from home on 12-18-14. You may send your transmission at any time that day. If you have a wireless device, the transmission will be sent automatically. After your physician reviews your transmission, you will receive a postcard with your next transmission date.   If any issues/ concerns with your device, call the device clinic in [place] at [phone].  Thank you for choosing [facility]!

## 2014-09-18 NOTE — Assessment & Plan Note (Signed)
Her blood pressure is well controlled. No change in meds. She is encouraged to maintain a low sodium diet.

## 2014-09-18 NOTE — Assessment & Plan Note (Signed)
Her [employer]. Jude DDD PM is working normally. Will recheck in several months. Approx. 7 years of battery longevity.

## 2014-09-18 NOTE — Assessment & Plan Note (Signed)
Her ventricular rate is well controlled. She will continue her current meds.

## 2014-09-18 NOTE — Progress Notes (Signed)
HPI  [name] returns today for followup. She is a very pleasant 81 year old woman with a history of now chronic atrial fibrillation, symptomatic bradycardia, and remote strokes.  In the interim, she has been stable. She denies peripheral edema, cough, or chest pain. No syncope. She has dyspnea with exertion. She describes an episode of bleeding behind her right eye. Her vision is almost back to her baseline. No Known Allergies   Current Outpatient Prescriptions  Medication Sig Dispense Refill  . acetaminophen (TYLENOL) 325 MG tablet Take 325 mg by mouth every 6 (six) hours as needed for moderate pain.    Marland Kitchen albuterol (PROVENTIL HFA;VENTOLIN HFA) 108 (90 BASE) MCG/ACT inhaler Inhale 2 puffs into the lungs every 6 (six) hours as needed for wheezing or shortness of breath.    . allopurinol (ZYLOPRIM) 100 MG tablet Take 100 mg by mouth daily.      Marland Kitchen diltiazem (TIAZAC) 360 MG 24 hr capsule TAKE ONE CAPSULE BY MOUTH ONCE DAILY. 30 capsule 6  . dorzolamide-timolol (COSOPT) 22.3-6.8 MG/ML ophthalmic solution Place 1 drop into both eyes at bedtime.     . furosemide (LASIX) 20 MG tablet TAKE ONE TABLET BY MOUTH ONCE DAILY. 30 tablet 3  . lisinopril (PRINIVIL,ZESTRIL) 20 MG tablet TAKE ONE TABLET BY MOUTH ONCE DAILY. 30 tablet 6  . meclizine (ANTIVERT) 25 MG tablet Take 25 mg by mouth 3 (three) times daily as needed. Dizziness    . pravastatin (PRAVACHOL) 80 MG tablet Take 80 mg by mouth at bedtime.    . ranitidine (ZANTAC) 150 MG tablet Take 150 mg by mouth at bedtime.     Marland Kitchen warfarin (COUMADIN) 5 MG tablet TAKE 1/2 TABLET BY MOUTH DAILY OR AS DIRECTED. 30 tablet 3  . [DISCONTINUED] colchicine 0.6 MG tablet Take 0.6 mg by mouth daily as needed. Gout Flare Ups    . [DISCONTINUED] diltiazem (CARDIZEM CD) 360 MG 24 hr capsule TAKE (1) CAPSULE BY MOUTH ONCE DAILY. 30 capsule 5  . [DISCONTINUED] simvastatin (ZOCOR) 40 MG tablet Take 40 mg by mouth at bedtime.       No current facility-administered  medications for this visit.     Past Medical History  Diagnosis Date  . Tachycardia-bradycardia syndrome     with pauses and syncope;PPM implantation 10/2008,negative stress nuclear study 03/2005  . Atrial fibrillation     persistant  . Mitral valve disease     Severe mitral annular calcification; mild to moderate stenosis-not clearly rheumatic  . Hypertension     heart disease diastolic dysfunction ; 123456; mild pulmonary edema in 2006;responded to diurectics ; [facility]  . Hyperlipidemia   . Angioedema 08/2006  . Anemia     H/H of 11.3/36.7 in 12/2008 ;normal MCV; normal CBC in 2011  . Gout   . 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 Vertigo   . Chronic bronchitis   . Blood transfusion   . DJD (degenerative joint disease)     hands, knees  . Seizure disorder   . Stroke   . Retinal hemorrhage   . Shortness of breath     only with exertion  . Pacemaker   . Chronic renal insufficiency     baseline creatine 1.4; 1.04 in 03/2010  . Nephrolithiasis   . Kidney stones     ROS:  All systems reviewed and negative except as noted in the HPI.   Past Surgical History  Procedure Laterality Date  . Insert / replace / remove pacemaker  11/2008    St.Jude DUAL chamber pacemaker 11/06/2008  . Total knee arthroplasty  04/2009    Right  . Cataract extraction w/phaco  03/29/2011    Procedure: CATARACT EXTRACTION PHACO AND INTRAOCULAR LENS PLACEMENT (IOC);  Surgeon: Tonny Branch;  Location: [facility];  Service: Ophthalmology;  Laterality: Right;  CDE 12.62  . Pars plana vitrectomy Right 02/12/2014    Procedure: PARS PLANA VITRECTOMY WITH 25 GAUGE;  Surgeon: [name], MD;  Location: [place];  Service: Ophthalmology;  Laterality: Right;  . Membrane peel Right 02/12/2014    Procedure: MEMBRANE  PEEL;  Surgeon: [name], MD;  Location: [place];  Service: Ophthalmology;  Laterality: Right;     Family History  Problem Relation Age of Onset  . Hypotension Neg Hx   . Anesthesia problems Neg Hx   . Pseudochol deficiency Neg Hx   . Malignant hyperthermia Neg Hx   . Cancer Other     unknown cancer     History   Social History  . Marital Status: Married    Spouse Name: N/A  . Number of Children: N/A  . Years of Education: N/A   Occupational History  . Not on file.   Social History Main Topics  . Smoking status: Never Smoker   . Smokeless tobacco: Never Used  . Alcohol Use: No  . Drug Use: No  . Sexual Activity: Not on file   Other Topics Concern  . Not on file   Social History Narrative     Pulse 62  Ht 5\' 2"  (1.575 m)  Wt 190 lb (86.183 kg)  BMI 34.74 kg/m2  Physical Exam:  Well appearing 81 year old woman, obese,NAD HEENT: Unremarkable Neck:  7 cm JVD, no thyromegally Lungs:  Clear with no wheezes, rales, or rhonchi. HEART:  IRegular rate rhythm, no murmurs, no rubs, no clicks Abd:  soft, positive bowel sounds, no organomegally, no rebound, no guarding Ext:  2 plus pulses, no edema, no cyanosis, no clubbing Skin:  No rashes no nodules Neuro:  CN II through XII intact, motor grossly intact except for very minimal right HP.  DEVICE  Normal device function.  See PaceArt for details.   Assess/Plan:

## 2014-09-25 ENCOUNTER — Ambulatory Visit (INDEPENDENT_AMBULATORY_CARE_PROVIDER_SITE_OTHER): Payer: Medicare HMO | Admitting: *Deleted

## 2014-09-25 DIAGNOSIS — Z5181 Encounter for therapeutic drug level monitoring: Secondary | ICD-10-CM

## 2014-09-25 DIAGNOSIS — I495 Sick sinus syndrome: Secondary | ICD-10-CM

## 2014-09-25 DIAGNOSIS — I639 Cerebral infarction, unspecified: Secondary | ICD-10-CM

## 2014-09-25 LAB — POCT INR: INR: 1.8

## 2014-09-27 ENCOUNTER — Emergency Department (HOSPITAL_COMMUNITY): Payer: Medicare HMO

## 2014-09-27 ENCOUNTER — Emergency Department (HOSPITAL_COMMUNITY)
Admission: EM | Admit: 2014-09-27 | Discharge: 2014-09-27 | Disposition: A | Discharge status: Home / Self Care | Payer: Medicare HMO | Attending: Emergency Medicine | Admitting: Emergency Medicine

## 2014-09-27 ENCOUNTER — Encounter (HOSPITAL_COMMUNITY): Payer: Self-pay | Admitting: Emergency Medicine

## 2014-09-27 DIAGNOSIS — M79675 Pain in left toe(s): Secondary | ICD-10-CM

## 2014-09-27 DIAGNOSIS — Z862 Personal history of diseases of the blood and blood-forming organs and certain disorders involving the immune mechanism: Secondary | ICD-10-CM | POA: Diagnosis not present

## 2014-09-27 DIAGNOSIS — E785 Hyperlipidemia, unspecified: Secondary | ICD-10-CM | POA: Insufficient documentation

## 2014-09-27 DIAGNOSIS — Z8669 Personal history of other diseases of the nervous system and sense organs: Secondary | ICD-10-CM | POA: Insufficient documentation

## 2014-09-27 DIAGNOSIS — Z87448 Personal history of other diseases of urinary system: Secondary | ICD-10-CM | POA: Insufficient documentation

## 2014-09-27 DIAGNOSIS — Z79899 Other long term (current) drug therapy: Secondary | ICD-10-CM | POA: Insufficient documentation

## 2014-09-27 DIAGNOSIS — Z8673 Personal history of transient ischemic attack (TIA), and cerebral infarction without residual deficits: Secondary | ICD-10-CM | POA: Insufficient documentation

## 2014-09-27 DIAGNOSIS — M79671 Pain in right foot: Secondary | ICD-10-CM | POA: Insufficient documentation

## 2014-09-27 DIAGNOSIS — Z7901 Long term (current) use of anticoagulants: Secondary | ICD-10-CM | POA: Insufficient documentation

## 2014-09-27 DIAGNOSIS — Z8709 Personal history of other diseases of the respiratory system: Secondary | ICD-10-CM | POA: Insufficient documentation

## 2014-09-27 DIAGNOSIS — Z87442 Personal history of urinary calculi: Secondary | ICD-10-CM | POA: Diagnosis not present

## 2014-09-27 DIAGNOSIS — I10 Essential (primary) hypertension: Secondary | ICD-10-CM | POA: Diagnosis not present

## 2014-09-27 DIAGNOSIS — Z950 Presence of cardiac pacemaker: Secondary | ICD-10-CM | POA: Insufficient documentation

## 2014-09-27 MED ORDER — ACETAMINOPHEN 325 MG PO TABS
650.0000 mg | ORAL_TABLET | Freq: Once | ORAL | Status: AC
  Administered 2014-09-27: 650 mg via ORAL
  Filled 2014-09-27: qty 2

## 2014-09-27 NOTE — Discharge Instructions (Signed)
If you were given medicines take as directed.  If you are on coumadin or contraceptives realize their levels and effectiveness is altered by many different medicines.  If you have any reaction (rash, tongues swelling, other) to the medicines stop taking and see a physician.   Please follow up as directed and return to the ER or see a physician for new or worsening symptoms.  Thank you. Filed Vitals:   09/27/14 1027 09/27/14 1030 09/27/14 1045  BP: 161/102 165/60   Pulse: 75 75 68  Temp: 97.8 F (36.6 C)    TempSrc: Oral    Resp: 16    Height: 5\' 2"  (1.575 m)    Weight: 179 lb (81.194 kg)    SpO2: 100% 98% 99%

## 2014-09-27 NOTE — ED Notes (Signed)
Pt c/o pain to her L foot that started last night. Pt has hx of gout. Pt isolates worst pain to her great toe.

## 2014-09-27 NOTE — ED Provider Notes (Addendum)
CSN: [id number]     Arrival date & time 09/27/14  1019 History  This chart was scribed for No att. providers found by Mercy Moore, ED scribe.  This patient was seen in room APA05/[facility]05 and the patient's care was started at 10:46 AM.   Chief Complaint  Patient presents with  . Foot Pain   The history is provided by the patient.   HPI Comments: [name] is a 81 y.o. female who presents to the Emergency Department complaining of left great toe pain onset last night. Patient reports trimming her toes nails five days ago. Patient shares history of stroke and complications with her left leg.   Patient denies surgeries to her foot. Patient denies history of PE/DVTs. Patient shares history of gout.   Past Medical History  Diagnosis Date  . Tachycardia-bradycardia syndrome     with pauses and syncope;PPM implantation 10/2008,negative stress nuclear study 03/2005  . Atrial fibrillation     persistant  . Mitral valve disease     Severe mitral annular calcification; mild to moderate stenosis-not clearly rheumatic  . Hypertension     heart disease diastolic dysfunction ; 123456; mild pulmonary edema in 2006;responded to diurectics ; [facility]  . Hyperlipidemia   . Angioedema 08/2006  . Anemia     H/H of 11.3/36.7 in 12/2008 ;normal MCV; normal CBC in 2011  . Gout   . 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 Vertigo   . Chronic bronchitis   . Blood transfusion   . DJD (degenerative joint disease)     hands, knees  . Seizure disorder   . Stroke   . Retinal hemorrhage   . Shortness of breath     only with exertion  . Pacemaker   . Chronic renal insufficiency     baseline creatine 1.4; 1.04 in 03/2010  . Nephrolithiasis   . Kidney stones    Past Surgical History  Procedure Laterality  Date  . Insert / replace / remove pacemaker  11/2008    St.Jude DUAL chamber pacemaker 11/06/2008  . Total knee arthroplasty  04/2009    Right  . Cataract extraction w/phaco  03/29/2011    Procedure: CATARACT EXTRACTION PHACO AND INTRAOCULAR LENS PLACEMENT (IOC);  Surgeon: Tonny Branch;  Location: [facility];  Service: Ophthalmology;  Laterality: Right;  CDE 12.62  . Pars plana vitrectomy Right 02/12/2014    Procedure: PARS PLANA VITRECTOMY WITH 25 GAUGE;  Surgeon: [name], MD;  Location: [place];  Service: Ophthalmology;  Laterality: Right;  . Membrane peel Right 02/12/2014    Procedure: MEMBRANE PEEL;  Surgeon: [name], MD;  Location: [place];  Service: Ophthalmology;  Laterality: Right;   Family History  Problem Relation Age of Onset  . Hypotension Neg Hx   . Anesthesia problems Neg Hx   . Pseudochol deficiency Neg Hx   . Malignant hyperthermia Neg Hx   . Cancer Other     unknown cancer   History  Substance Use Topics  . Smoking status: Never Smoker   . Smokeless tobacco: Never Used  . Alcohol Use: No   OB History    Gravida Para Term Preterm AB TAB SAB Ectopic Multiple Living   5 5 5             Review of  Systems  Allergic/Immunologic: Negative for immunocompromised state.      Allergies  Review of patient's allergies indicates no known allergies.  Home Medications   Prior to Admission medications   Medication Sig Start Date End Date Taking? Authorizing Provider  acetaminophen (TYLENOL) 325 MG tablet Take 325 mg by mouth every 6 (six) hours as needed for moderate pain.   Yes Historical Provider, MD  albuterol (PROVENTIL HFA;VENTOLIN HFA) 108 (90 BASE) MCG/ACT inhaler Inhale 2 puffs into the lungs every 6 (six) hours as needed for wheezing or shortness of breath.   Yes Historical Provider, MD  allopurinol (ZYLOPRIM) 100 MG tablet Take 100 mg by mouth daily.     Yes Historical Provider, MD  diltiazem (TIAZAC) 360 MG 24 hr capsule TAKE ONE CAPSULE BY MOUTH ONCE DAILY.  06/26/14  Yes Herminio Commons, MD  dorzolamide-timolol (COSOPT) 22.3-6.8 MG/ML ophthalmic solution Place 1 drop into both eyes at bedtime.    Yes Historical Provider, MD  furosemide (LASIX) 20 MG tablet TAKE ONE TABLET BY MOUTH ONCE DAILY. 05/09/14  Yes Lendon Colonel, NP  lisinopril (PRINIVIL,ZESTRIL) 20 MG tablet TAKE ONE TABLET BY MOUTH ONCE DAILY. 05/09/14  Yes Herminio Commons, MD  meclizine (ANTIVERT) 25 MG tablet Take 25 mg by mouth 3 (three) times daily as needed. Dizziness 11/11/10  Yes Imogene Burn, PA-C  pravastatin (PRAVACHOL) 80 MG tablet Take 80 mg by mouth at bedtime.   Yes Historical Provider, MD  ranitidine (ZANTAC) 150 MG tablet Take 150 mg by mouth at bedtime.    Yes Historical Provider, MD  warfarin (COUMADIN) 5 MG tablet TAKE 1/2 TABLET BY MOUTH DAILY OR AS DIRECTED. Patient taking differently: TAKE 1/2 TABLET BY MOUTH DAILY. 05/20/14  Yes Herminio Commons, MD   Triage Vitals: BP 161/102 mmHg  Pulse 75  Temp(Src) 97.8 F (36.6 C) (Oral)  Resp 16  Ht 5\' 2"  (1.575 m)  Wt 179 lb (81.194 kg)  BMI 32.73 kg/m2  SpO2 100% Physical Exam  Constitutional: She is oriented to person, place, and time. She appears well-developed and well-nourished. No distress.  HENT:  Head: Normocephalic and atraumatic.  Eyes: EOM are normal.  Neck: Neck supple. No tracheal deviation present.  Cardiovascular: Normal rate.   Pulmonary/Chest: Effort normal. No respiratory distress.  Musculoskeletal: Normal range of motion.  Neurological: She is alert and oriented to person, place, and time.  Skin: Skin is warm and dry.  Tenderness to lateral great toe. No warmth no erythema. Dried nails with fungal appearance to nails and nail bed. Good ROM.   Psychiatric: She has a normal mood and affect. Her behavior is normal.  Nursing note and vitals reviewed.   ED Course  Procedures (including critical care time)  COORDINATION OF CARE: 12:50 AM- Discussed treatment plan with patient at  bedside and patient agreed to plan.   Labs Review Labs Reviewed - No data to display  Imaging Review No results found.   EKG Interpretation None      MDM   Final diagnoses:  Toe pain, left   I personally performed the services described in this documentation, which was scribed in my presence. The recorded information has been reviewed and is accurate.   Results and differential diagnosis were discussed with the patient/parent/guardian. Close follow up outpatient was discussed, comfortable with the plan.   Medications  acetaminophen (TYLENOL) tablet 650 mg (650 mg Oral Given 09/27/14 1104)    Filed Vitals:   09/27/14 1130 09/27/14 1200 09/27/14 1215 09/27/14  1230  BP:  140/74  142/63  Pulse: 63 58 67 63  Temp:      TempSrc:      Resp:      Height:      Weight:      SpO2: 99% 97% 100% 96%    Final diagnoses:  Toe pain, left      Elnora Morrison, MD 10/13/14 [id number]  Elnora Morrison, MD 11/08/14 [phone]

## 2014-10-15 ENCOUNTER — Encounter: Payer: Self-pay | Admitting: Internal Medicine

## 2014-10-16 ENCOUNTER — Ambulatory Visit (INDEPENDENT_AMBULATORY_CARE_PROVIDER_SITE_OTHER): Payer: Medicare HMO | Admitting: *Deleted

## 2014-10-16 DIAGNOSIS — I639 Cerebral infarction, unspecified: Secondary | ICD-10-CM | POA: Diagnosis not present

## 2014-10-16 DIAGNOSIS — Z5181 Encounter for therapeutic drug level monitoring: Secondary | ICD-10-CM | POA: Diagnosis not present

## 2014-10-16 DIAGNOSIS — I495 Sick sinus syndrome: Secondary | ICD-10-CM | POA: Diagnosis not present

## 2014-10-16 LAB — POCT INR: INR: 2.1

## 2014-11-13 ENCOUNTER — Ambulatory Visit (INDEPENDENT_AMBULATORY_CARE_PROVIDER_SITE_OTHER): Payer: Medicare HMO | Admitting: *Deleted

## 2014-11-13 DIAGNOSIS — I639 Cerebral infarction, unspecified: Secondary | ICD-10-CM

## 2014-11-13 DIAGNOSIS — Z5181 Encounter for therapeutic drug level monitoring: Secondary | ICD-10-CM

## 2014-11-13 DIAGNOSIS — I495 Sick sinus syndrome: Secondary | ICD-10-CM | POA: Diagnosis not present

## 2014-11-13 LAB — POCT INR: INR: 1.9

## 2014-11-18 ENCOUNTER — Encounter: Payer: Medicare HMO | Admitting: Internal Medicine

## 2014-12-07 ENCOUNTER — Other Ambulatory Visit: Payer: Self-pay | Admitting: Cardiovascular Disease

## 2014-12-11 ENCOUNTER — Ambulatory Visit (INDEPENDENT_AMBULATORY_CARE_PROVIDER_SITE_OTHER): Payer: Medicare HMO | Admitting: *Deleted

## 2014-12-11 DIAGNOSIS — I495 Sick sinus syndrome: Secondary | ICD-10-CM | POA: Diagnosis not present

## 2014-12-11 DIAGNOSIS — I639 Cerebral infarction, unspecified: Secondary | ICD-10-CM

## 2014-12-11 DIAGNOSIS — Z5181 Encounter for therapeutic drug level monitoring: Secondary | ICD-10-CM

## 2014-12-11 LAB — POCT INR: INR: 1.8

## 2014-12-16 ENCOUNTER — Other Ambulatory Visit: Payer: Self-pay | Admitting: Adult Health

## 2014-12-18 ENCOUNTER — Ambulatory Visit (INDEPENDENT_AMBULATORY_CARE_PROVIDER_SITE_OTHER): Payer: Medicare HMO | Admitting: *Deleted

## 2014-12-18 DIAGNOSIS — I495 Sick sinus syndrome: Secondary | ICD-10-CM | POA: Diagnosis not present

## 2014-12-18 NOTE — Progress Notes (Signed)
Remote pacemaker transmission.   

## 2014-12-23 ENCOUNTER — Other Ambulatory Visit: Payer: Self-pay | Admitting: Cardiovascular Disease

## 2014-12-26 LAB — CUP PACEART REMOTE DEVICE CHECK
Battery Remaining Longevity: 105 mo
Battery Voltage: 2.93 V
Brady Statistic RV Percent Paced: 63 %
Date Time Interrogation Session: 20160713073634
Lead Channel Impedance Value: 460 Ohm
Lead Channel Sensing Intrinsic Amplitude: 12 mV
MDC IDC MSMT BATTERY REMAINING PERCENTAGE: 81 %
MDC IDC PG SERIAL: 2323973
MDC IDC SET LEADCHNL RV PACING AMPLITUDE: 2.5 V
MDC IDC SET LEADCHNL RV PACING PULSEWIDTH: 0.4 ms
MDC IDC SET LEADCHNL RV SENSING SENSITIVITY: 2 mV
Pulse Gen Model: 2210

## 2015-01-01 ENCOUNTER — Ambulatory Visit (INDEPENDENT_AMBULATORY_CARE_PROVIDER_SITE_OTHER): Payer: Medicare HMO | Admitting: *Deleted

## 2015-01-01 DIAGNOSIS — I495 Sick sinus syndrome: Secondary | ICD-10-CM

## 2015-01-01 DIAGNOSIS — Z5181 Encounter for therapeutic drug level monitoring: Secondary | ICD-10-CM | POA: Diagnosis not present

## 2015-01-01 DIAGNOSIS — I639 Cerebral infarction, unspecified: Secondary | ICD-10-CM

## 2015-01-01 LAB — POCT INR: INR: 3

## 2015-01-07 ENCOUNTER — Encounter: Payer: Self-pay | Admitting: Family Medicine

## 2015-01-07 ENCOUNTER — Ambulatory Visit: Payer: Medicare HMO | Admitting: Family Medicine

## 2015-01-07 VITALS — BP 124/60 | Ht 62.0 in | Wt 186.5 lb

## 2015-01-07 DIAGNOSIS — E785 Hyperlipidemia, unspecified: Secondary | ICD-10-CM

## 2015-01-07 DIAGNOSIS — M1000 Idiopathic gout, unspecified site: Secondary | ICD-10-CM

## 2015-01-07 DIAGNOSIS — I10 Essential (primary) hypertension: Secondary | ICD-10-CM

## 2015-01-07 MED ORDER — ALLOPURINOL 100 MG PO TABS: 100.0000 mg | ORAL_TABLET | Freq: Every day | ORAL | Status: DC

## 2015-01-07 MED ORDER — PRAVASTATIN SODIUM 80 MG PO TABS: 80.0000 mg | ORAL_TABLET | Freq: Every day | ORAL | Status: DC

## 2015-01-07 MED ORDER — FUROSEMIDE 20 MG PO TABS: 20.0000 mg | ORAL_TABLET | Freq: Every day | ORAL | Status: DC

## 2015-01-07 NOTE — Progress Notes (Signed)
   Subjective:    Patient ID: [name], female    DOB: [birth year], 81 y.o.   MRN: [id number]  Hypertension This is a chronic problem. The current episode started more than 1 year ago. There are no compliance problems.    she relates taking her medication she does try to watch her diet Hx gout no problems with this currently History hyperlipidemia takes medication Findings she had lab work from her previous doctor within the past several months  She has a history of gout but none recently she takes medication  Patient states that she has no concerns this visit.  Review of Systems    denies chest tightness pressure pain shortness breath nausea vomiting diarrhea Objective:   Physical Exam Neck no masses lungs are clear no crackles heart irregular abdomen soft extremities no edema skin warm dry   30 minutes spent with the patient establishing her care here    Assessment & Plan:  Atrial fibrillation rate under good control Blood pressure under good control We'll get records from her previous doctor possibly be able to use the labs that they did if not we will need to reorder Follow-up in 3 months Coumadin via cardiology

## 2015-01-10 ENCOUNTER — Encounter: Payer: Self-pay | Admitting: Cardiology

## 2015-01-17 ENCOUNTER — Encounter: Payer: Self-pay | Admitting: Internal Medicine

## 2015-01-22 ENCOUNTER — Other Ambulatory Visit: Payer: Self-pay

## 2015-01-22 MED ORDER — LISINOPRIL 20 MG PO TABS: 20.0000 mg | ORAL_TABLET | Freq: Every day | ORAL | Status: DC

## 2015-01-22 NOTE — Telephone Encounter (Signed)
Refill complete 

## 2015-01-29 ENCOUNTER — Ambulatory Visit (INDEPENDENT_AMBULATORY_CARE_PROVIDER_SITE_OTHER): Payer: Medicare HMO | Admitting: *Deleted

## 2015-01-29 DIAGNOSIS — I495 Sick sinus syndrome: Secondary | ICD-10-CM

## 2015-01-29 DIAGNOSIS — I639 Cerebral infarction, unspecified: Secondary | ICD-10-CM

## 2015-01-29 DIAGNOSIS — Z5181 Encounter for therapeutic drug level monitoring: Secondary | ICD-10-CM

## 2015-01-29 LAB — POCT INR: INR: 3.8

## 2015-01-30 ENCOUNTER — Other Ambulatory Visit: Payer: Self-pay | Admitting: Cardiovascular Disease

## 2015-02-19 ENCOUNTER — Ambulatory Visit (INDEPENDENT_AMBULATORY_CARE_PROVIDER_SITE_OTHER): Payer: Medicare HMO | Admitting: *Deleted

## 2015-02-19 DIAGNOSIS — Z5181 Encounter for therapeutic drug level monitoring: Secondary | ICD-10-CM

## 2015-02-19 DIAGNOSIS — I495 Sick sinus syndrome: Secondary | ICD-10-CM

## 2015-02-19 DIAGNOSIS — I639 Cerebral infarction, unspecified: Secondary | ICD-10-CM

## 2015-02-19 LAB — POCT INR: INR: 2.4

## 2015-03-19 ENCOUNTER — Other Ambulatory Visit: Payer: Self-pay | Admitting: Cardiovascular Disease

## 2015-03-19 ENCOUNTER — Ambulatory Visit (INDEPENDENT_AMBULATORY_CARE_PROVIDER_SITE_OTHER): Payer: Medicare HMO | Admitting: *Deleted

## 2015-03-19 DIAGNOSIS — I495 Sick sinus syndrome: Secondary | ICD-10-CM

## 2015-03-19 DIAGNOSIS — Z5181 Encounter for therapeutic drug level monitoring: Secondary | ICD-10-CM | POA: Diagnosis not present

## 2015-03-19 DIAGNOSIS — I639 Cerebral infarction, unspecified: Secondary | ICD-10-CM

## 2015-03-19 LAB — POCT INR: INR: 3.3

## 2015-03-24 ENCOUNTER — Ambulatory Visit (INDEPENDENT_AMBULATORY_CARE_PROVIDER_SITE_OTHER): Payer: Medicare HMO | Admitting: *Deleted

## 2015-03-24 DIAGNOSIS — I495 Sick sinus syndrome: Secondary | ICD-10-CM | POA: Diagnosis not present

## 2015-03-24 LAB — CUP PACEART REMOTE DEVICE CHECK
Brady Statistic RV Percent Paced: 65 %
Implantable Lead Implant Date: 20100602
Implantable Lead Location: 753859
Lead Channel Impedance Value: 410 Ohm
Lead Channel Setting Pacing Amplitude: 2.5 V
Lead Channel Setting Pacing Pulse Width: 0.4 ms
MDC IDC LEAD IMPLANT DT: 20100602
MDC IDC LEAD LOCATION: 753860
MDC IDC MSMT BATTERY REMAINING LONGEVITY: 91 mo
MDC IDC MSMT BATTERY REMAINING PERCENTAGE: 73 %
MDC IDC MSMT BATTERY VOLTAGE: 2.92 V
MDC IDC MSMT LEADCHNL RV SENSING INTR AMPL: 12 mV
MDC IDC SESS DTM: 20161017060907
MDC IDC SET LEADCHNL RV SENSING SENSITIVITY: 2 mV
Pulse Gen Serial Number: 2323973

## 2015-03-24 NOTE — Progress Notes (Signed)
Remote pacemaker transmission.   

## 2015-03-25 ENCOUNTER — Encounter: Payer: Self-pay | Admitting: Cardiology

## 2015-04-09 ENCOUNTER — Encounter: Payer: Self-pay | Admitting: Family Medicine

## 2015-04-09 ENCOUNTER — Ambulatory Visit (INDEPENDENT_AMBULATORY_CARE_PROVIDER_SITE_OTHER): Payer: Medicare HMO | Admitting: *Deleted

## 2015-04-09 ENCOUNTER — Ambulatory Visit (INDEPENDENT_AMBULATORY_CARE_PROVIDER_SITE_OTHER): Payer: Medicare HMO | Admitting: Family Medicine

## 2015-04-09 VITALS — BP 142/88 | Ht 62.0 in | Wt 192.6 lb

## 2015-04-09 DIAGNOSIS — Z5181 Encounter for therapeutic drug level monitoring: Secondary | ICD-10-CM | POA: Diagnosis not present

## 2015-04-09 DIAGNOSIS — R7301 Impaired fasting glucose: Secondary | ICD-10-CM | POA: Diagnosis not present

## 2015-04-09 DIAGNOSIS — I10 Essential (primary) hypertension: Secondary | ICD-10-CM | POA: Diagnosis not present

## 2015-04-09 DIAGNOSIS — Z7901 Long term (current) use of anticoagulants: Secondary | ICD-10-CM | POA: Diagnosis not present

## 2015-04-09 DIAGNOSIS — I639 Cerebral infarction, unspecified: Secondary | ICD-10-CM

## 2015-04-09 DIAGNOSIS — Z23 Encounter for immunization: Secondary | ICD-10-CM | POA: Diagnosis not present

## 2015-04-09 DIAGNOSIS — E785 Hyperlipidemia, unspecified: Secondary | ICD-10-CM | POA: Diagnosis not present

## 2015-04-09 DIAGNOSIS — I495 Sick sinus syndrome: Secondary | ICD-10-CM | POA: Diagnosis not present

## 2015-04-09 LAB — POCT INR: INR: 1.9

## 2015-04-09 NOTE — Progress Notes (Signed)
   Subjective:    Patient ID: [name], female    DOB: [birth year], 81 y.o.   MRN: [id number]  Hypertension This is a chronic problem. The current episode started more than 1 year ago. Pertinent negatives include no chest pain. Risk factors for coronary artery disease include post-menopausal state and sedentary lifestyle. Treatments tried: tiazac, lasix, lisinopril. There are no compliance problems.     patient states she takes her cholesterol medicine on a regular basis denies any problems she tries watch her diet She avoids excessive sugars in the diet.  Constant the knee pain causes her to feel weak in her legs uses a cane to get around she tries to deal with the best she can no recent falls she states her breathing is been doing good.   Review of Systems  Constitutional: Negative for activity change, appetite change and fatigue.  HENT: Negative for congestion.   Respiratory: Negative for cough.   Cardiovascular: Negative for chest pain.  Gastrointestinal: Negative for abdominal pain.  Endocrine: Negative for polydipsia and polyphagia.  Neurological: Negative for weakness.  Psychiatric/Behavioral: Negative for confusion.   25 minutes was spent with the patient. Greater than half the time was spent in discussion and answering questions and counseling regarding the issues that the patient came in for today.     Objective:   Physical Exam  Constitutional: She appears well-nourished. No distress.  Cardiovascular: Normal rate and normal heart sounds.   No murmur heard. Pulmonary/Chest: Effort normal and breath sounds normal. No respiratory distress.  Musculoskeletal: She exhibits no edema.  Lymphadenopathy:    She has no cervical adenopathy.  Neurological: She is alert. She exhibits normal muscle tone.  Psychiatric: Her behavior is normal.  Vitals reviewed.         Assessment & Plan:   HTN-good control current medications continue current medications watch diet stay  active   osteoarthritis of the knees not much can be done for this Tylenol for pain use her cane to walk around no recent falls   intermittent atrial fibrillation on chronic Coumadin I would recommend checking CBC to make sure hemoglobin is good patient denies any bleeding issues  hyperlipidemia check lipid profile await the results of this. Continue medication  history hyperglycemia check glucose level. Watch diet closely stay active

## 2015-04-10 LAB — CBC WITH DIFFERENTIAL/PLATELET
BASOS ABS: 0 10*3/uL (ref 0.0–0.2)
Basos: 0 %
EOS (ABSOLUTE): 0 10*3/uL (ref 0.0–0.4)
EOS: 1 %
HEMATOCRIT: 40.4 % (ref 34.0–46.6)
HEMOGLOBIN: 13.2 g/dL (ref 11.1–15.9)
IMMATURE GRANS (ABS): 0 10*3/uL (ref 0.0–0.1)
IMMATURE GRANULOCYTES: 0 %
LYMPHS: 32 %
Lymphocytes Absolute: 2.2 10*3/uL (ref 0.7–3.1)
MCH: 30 pg (ref 26.6–33.0)
MCHC: 32.7 g/dL (ref 31.5–35.7)
MCV: 92 fL (ref 79–97)
MONOCYTES: 10 %
Monocytes Absolute: 0.7 10*3/uL (ref 0.1–0.9)
Neutrophils Absolute: 4 10*3/uL (ref 1.4–7.0)
Neutrophils: 57 %
Platelets: 217 10*3/uL (ref 150–379)
RBC: 4.4 x10E6/uL (ref 3.77–5.28)
RDW: 13.8 % (ref 12.3–15.4)
WBC: 6.9 10*3/uL (ref 3.4–10.8)

## 2015-04-10 LAB — LIPID PANEL
CHOL/HDL RATIO: 2.1 ratio (ref 0.0–4.4)
CHOLESTEROL TOTAL: 127 mg/dL (ref 100–199)
HDL: 60 mg/dL (ref >39)
LDL CALC: 44 mg/dL (ref 0–99)
TRIGLYCERIDES: 114 mg/dL (ref 0–149)
VLDL CHOLESTEROL CAL: 23 mg/dL (ref 5–40)

## 2015-04-10 LAB — BASIC METABOLIC PANEL
BUN/Creatinine Ratio: 13 (ref 11–26)
BUN: 14 mg/dL (ref 8–27)
CALCIUM: 10.6 mg/dL — AB (ref 8.7–10.3)
CO2: 25 mmol/L (ref 18–29)
Chloride: 103 mmol/L (ref 97–106)
Creatinine, Ser: 1.05 mg/dL — ABNORMAL HIGH (ref 0.57–1.00)
GFR, EST AFRICAN AMERICAN: 56 mL/min/{1.73_m2} — AB (ref >59)
GFR, EST NON AFRICAN AMERICAN: 48 mL/min/{1.73_m2} — AB (ref >59)
GLUCOSE: 114 mg/dL — AB (ref 65–99)
POTASSIUM: 4.5 mmol/L (ref 3.5–5.2)
Sodium: 143 mmol/L (ref 136–144)

## 2015-04-10 LAB — HEPATIC FUNCTION PANEL
ALT: 10 IU/L (ref 0–32)
AST: 17 IU/L (ref 0–40)
Albumin: 4.1 g/dL (ref 3.5–4.7)
Alkaline Phosphatase: 81 IU/L (ref 39–117)
BILIRUBIN TOTAL: 0.4 mg/dL (ref 0.0–1.2)
Bilirubin, Direct: 0.13 mg/dL (ref 0.00–0.40)
Total Protein: 6.6 g/dL (ref 6.0–8.5)

## 2015-04-11 ENCOUNTER — Other Ambulatory Visit: Payer: Self-pay

## 2015-04-11 DIAGNOSIS — E58 Dietary calcium deficiency: Secondary | ICD-10-CM

## 2015-04-30 ENCOUNTER — Ambulatory Visit (INDEPENDENT_AMBULATORY_CARE_PROVIDER_SITE_OTHER): Payer: Medicare HMO | Admitting: *Deleted

## 2015-04-30 DIAGNOSIS — I639 Cerebral infarction, unspecified: Secondary | ICD-10-CM | POA: Diagnosis not present

## 2015-04-30 DIAGNOSIS — Z5181 Encounter for therapeutic drug level monitoring: Secondary | ICD-10-CM | POA: Diagnosis not present

## 2015-04-30 DIAGNOSIS — I495 Sick sinus syndrome: Secondary | ICD-10-CM

## 2015-04-30 LAB — POCT INR: INR: 2

## 2015-05-21 ENCOUNTER — Telehealth: Payer: Self-pay | Admitting: Family Medicine

## 2015-05-21 MED ORDER — HYDROCODONE-HOMATROPINE 5-1.5 MG/5ML PO SYRP: ORAL_SOLUTION | ORAL | Status: DC

## 2015-05-21 NOTE — Telephone Encounter (Signed)
Called patient and informed her that prescription for hydromet was ready for pickup per Dr.Scott Luking- Patient verbalized understanding.

## 2015-05-21 NOTE — Telephone Encounter (Signed)
Pt is needing a refill on her Hydromet syrup.

## 2015-05-21 NOTE — Telephone Encounter (Signed)
1 refill would be fine, home use only in the evening caution drowsiness

## 2015-05-28 ENCOUNTER — Ambulatory Visit (INDEPENDENT_AMBULATORY_CARE_PROVIDER_SITE_OTHER): Payer: Medicare HMO | Admitting: Pharmacist

## 2015-05-28 DIAGNOSIS — I495 Sick sinus syndrome: Secondary | ICD-10-CM

## 2015-05-28 DIAGNOSIS — I639 Cerebral infarction, unspecified: Secondary | ICD-10-CM | POA: Diagnosis not present

## 2015-05-28 DIAGNOSIS — Z5181 Encounter for therapeutic drug level monitoring: Secondary | ICD-10-CM | POA: Diagnosis not present

## 2015-05-28 LAB — POCT INR: INR: 3.6

## 2015-06-17 ENCOUNTER — Other Ambulatory Visit: Payer: Self-pay | Admitting: Family Medicine

## 2015-06-23 ENCOUNTER — Ambulatory Visit (INDEPENDENT_AMBULATORY_CARE_PROVIDER_SITE_OTHER): Payer: Medicare HMO | Admitting: Ophthalmology

## 2015-06-23 ENCOUNTER — Ambulatory Visit (INDEPENDENT_AMBULATORY_CARE_PROVIDER_SITE_OTHER): Payer: Medicare HMO | Admitting: *Deleted

## 2015-06-23 DIAGNOSIS — I495 Sick sinus syndrome: Secondary | ICD-10-CM | POA: Diagnosis not present

## 2015-06-23 NOTE — Progress Notes (Signed)
Remote pacemaker transmission.   

## 2015-06-25 ENCOUNTER — Ambulatory Visit (INDEPENDENT_AMBULATORY_CARE_PROVIDER_SITE_OTHER): Payer: Medicare HMO | Admitting: Pharmacist

## 2015-06-25 DIAGNOSIS — I495 Sick sinus syndrome: Secondary | ICD-10-CM | POA: Diagnosis not present

## 2015-06-25 DIAGNOSIS — I639 Cerebral infarction, unspecified: Secondary | ICD-10-CM | POA: Diagnosis not present

## 2015-06-25 DIAGNOSIS — Z5181 Encounter for therapeutic drug level monitoring: Secondary | ICD-10-CM | POA: Diagnosis not present

## 2015-06-25 LAB — POCT INR: INR: 2

## 2015-06-27 ENCOUNTER — Encounter: Payer: Self-pay | Admitting: Cardiology

## 2015-06-27 LAB — CUP PACEART REMOTE DEVICE CHECK
Battery Remaining Longevity: 94 mo
Battery Remaining Percentage: 73 %
Implantable Lead Implant Date: 20100602
Implantable Lead Location: 753860
Lead Channel Impedance Value: 460 Ohm
Lead Channel Sensing Intrinsic Amplitude: 12 mV
Lead Channel Setting Pacing Pulse Width: 0.4 ms
MDC IDC LEAD IMPLANT DT: 20100602
MDC IDC LEAD LOCATION: 753859
MDC IDC MSMT BATTERY VOLTAGE: 2.92 V
MDC IDC PG SERIAL: 2323973
MDC IDC SESS DTM: 20170116084100
MDC IDC SET LEADCHNL RV PACING AMPLITUDE: 2.5 V
MDC IDC SET LEADCHNL RV SENSING SENSITIVITY: 2 mV
MDC IDC STAT BRADY RV PERCENT PACED: 63 %

## 2015-07-07 ENCOUNTER — Other Ambulatory Visit: Payer: Self-pay | Admitting: Family Medicine

## 2015-07-22 DIAGNOSIS — L03031 Cellulitis of right toe: Secondary | ICD-10-CM | POA: Diagnosis not present

## 2015-07-22 DIAGNOSIS — L84 Corns and callosities: Secondary | ICD-10-CM | POA: Diagnosis not present

## 2015-07-22 DIAGNOSIS — L602 Onychogryphosis: Secondary | ICD-10-CM | POA: Diagnosis not present

## 2015-07-22 DIAGNOSIS — L97519 Non-pressure chronic ulcer of other part of right foot with unspecified severity: Secondary | ICD-10-CM | POA: Diagnosis not present

## 2015-07-23 ENCOUNTER — Ambulatory Visit (INDEPENDENT_AMBULATORY_CARE_PROVIDER_SITE_OTHER): Payer: Medicare HMO | Admitting: *Deleted

## 2015-07-23 DIAGNOSIS — Z5181 Encounter for therapeutic drug level monitoring: Secondary | ICD-10-CM

## 2015-07-23 DIAGNOSIS — I639 Cerebral infarction, unspecified: Secondary | ICD-10-CM

## 2015-07-23 DIAGNOSIS — I495 Sick sinus syndrome: Secondary | ICD-10-CM

## 2015-07-23 LAB — POCT INR: INR: 2.3

## 2015-07-30 ENCOUNTER — Ambulatory Visit (INDEPENDENT_AMBULATORY_CARE_PROVIDER_SITE_OTHER): Payer: Medicare HMO | Admitting: *Deleted

## 2015-07-30 DIAGNOSIS — Z5181 Encounter for therapeutic drug level monitoring: Secondary | ICD-10-CM | POA: Diagnosis not present

## 2015-07-30 DIAGNOSIS — I639 Cerebral infarction, unspecified: Secondary | ICD-10-CM | POA: Diagnosis not present

## 2015-07-30 DIAGNOSIS — I495 Sick sinus syndrome: Secondary | ICD-10-CM | POA: Diagnosis not present

## 2015-07-30 LAB — POCT INR: INR: 1.8

## 2015-08-12 DIAGNOSIS — Z0100 Encounter for examination of eyes and vision without abnormal findings: Secondary | ICD-10-CM | POA: Diagnosis not present

## 2015-08-12 DIAGNOSIS — H5200 Hypermetropia, unspecified eye: Secondary | ICD-10-CM | POA: Diagnosis not present

## 2015-08-22 ENCOUNTER — Other Ambulatory Visit: Payer: Self-pay | Admitting: Internal Medicine

## 2015-08-25 ENCOUNTER — Ambulatory Visit (INDEPENDENT_AMBULATORY_CARE_PROVIDER_SITE_OTHER): Payer: Medicare HMO | Admitting: *Deleted

## 2015-08-25 DIAGNOSIS — Z5181 Encounter for therapeutic drug level monitoring: Secondary | ICD-10-CM | POA: Diagnosis not present

## 2015-08-25 DIAGNOSIS — I495 Sick sinus syndrome: Secondary | ICD-10-CM | POA: Diagnosis not present

## 2015-08-25 DIAGNOSIS — I639 Cerebral infarction, unspecified: Secondary | ICD-10-CM | POA: Diagnosis not present

## 2015-08-25 LAB — POCT INR: INR: 2.2

## 2015-09-06 ENCOUNTER — Other Ambulatory Visit: Payer: Self-pay | Admitting: Family Medicine

## 2015-09-17 ENCOUNTER — Ambulatory Visit (INDEPENDENT_AMBULATORY_CARE_PROVIDER_SITE_OTHER): Payer: Medicare HMO | Admitting: Internal Medicine

## 2015-09-17 ENCOUNTER — Encounter: Payer: Self-pay | Admitting: Internal Medicine

## 2015-09-17 VITALS — BP 169/67 | HR 91 | Ht 62.0 in | Wt 197.0 lb

## 2015-09-17 DIAGNOSIS — I4891 Unspecified atrial fibrillation: Secondary | ICD-10-CM | POA: Diagnosis not present

## 2015-09-17 NOTE — Patient Instructions (Signed)

## 2015-09-17 NOTE — Progress Notes (Signed)
HPI  [name] returns today for followup. She is a very pleasant 82 year old woman with a history of now chronic atrial fibrillation, symptomatic bradycardia, and remote strokes.  In the interim, she has been stable. She denies peripheral edema, cough, or chest pain. No syncope. She has dyspnea with exertion. She remains active cooking and cleaning her house. No Known Allergies   Current Outpatient Prescriptions  Medication Sig Dispense Refill  . acetaminophen (TYLENOL) 325 MG tablet Take 325 mg by mouth every 6 (six) hours as needed for moderate pain.    Marland Kitchen albuterol (PROVENTIL HFA;VENTOLIN HFA) 108 (90 BASE) MCG/ACT inhaler Inhale 2 puffs into the lungs every 6 (six) hours as needed for wheezing or shortness of breath.    . allopurinol (ZYLOPRIM) 100 MG tablet TAKE ONE TABLET DAILY TO PREVENT GOUT. 30 tablet 4  . diltiazem (TIAZAC) 360 MG 24 hr capsule TAKE ONE CAPSULE BY MOUTH ONCE DAILY. 30 capsule 11  . dorzolamide-timolol (COSOPT) 22.3-6.8 MG/ML ophthalmic solution Place 1 drop into both eyes at bedtime.     . furosemide (LASIX) 20 MG tablet TAKE ONE TABLET BY MOUTH ONCE DAILY. 30 tablet 0  . HYDROcodone-homatropine (HYCODAN) 5-1.5 MG/5ML syrup Take 1 teaspoon at bedtime as needed for cough. (Caution drowsiness) 90 mL 0  . lisinopril (PRINIVIL,ZESTRIL) 20 MG tablet Take 1 tablet (20 mg total) by mouth daily. 90 tablet 4  . meclizine (ANTIVERT) 25 MG tablet Take 25 mg by mouth 3 (three) times daily as needed. Dizziness    . pravastatin (PRAVACHOL) 80 MG tablet TAKE (1) TABLET BY MOUTH AT BEDTIME. 30 tablet 5  . ranitidine (ZANTAC) 150 MG tablet Take 150 mg by mouth at bedtime.     Marland Kitchen warfarin (COUMADIN) 5 MG tablet Take 0.5-1 tablets (2.5-5 mg total) by mouth daily. 30 tablet 3  . [DISCONTINUED] colchicine 0.6 MG tablet Take 0.6 mg by mouth daily as needed. Gout Flare Ups    . [DISCONTINUED] diltiazem (CARDIZEM CD) 360 MG 24 hr capsule TAKE (1) CAPSULE BY MOUTH ONCE DAILY. 30 capsule 5   . [DISCONTINUED] simvastatin (ZOCOR) 40 MG tablet Take 40 mg by mouth at bedtime.       No current facility-administered medications for this visit.     Past Medical History  Diagnosis Date  . Tachycardia-bradycardia syndrome ([place])     with pauses and syncope;PPM implantation 10/2008,negative stress nuclear study 03/2005  . Atrial fibrillation ([facility])     persistant  . Mitral valve disease     Severe mitral annular calcification; mild to moderate stenosis-not clearly rheumatic  . Hypertension     heart disease diastolic dysfunction ; 123456; mild pulmonary edema in 2006;responded to diurectics ; [facility]  . Hyperlipidemia   . Angioedema 08/2006  . Anemia     H/H of 11.3/36.7 in 12/2008 ;normal MCV; normal CBC in 2011  . Gout   . 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 Vertigo   . Chronic bronchitis   . Blood transfusion   . DJD (degenerative joint disease)     hands, knees  . Seizure disorder ([place])   . Stroke ([place])   . Retinal hemorrhage   . Shortness of breath     only with exertion  . Pacemaker   . Chronic renal insufficiency     baseline  creatine 1.4; 1.04 in 03/2010  . Nephrolithiasis   . Kidney stones     ROS:   All systems reviewed and negative except as noted in the HPI.   Past Surgical History  Procedure Laterality Date  . Insert / replace / remove pacemaker  11/2008    St.Jude DUAL chamber pacemaker 11/06/2008  . Total knee arthroplasty  04/2009    Right  . Cataract extraction w/phaco  03/29/2011    Procedure: CATARACT EXTRACTION PHACO AND INTRAOCULAR LENS PLACEMENT (IOC);  Surgeon: Tonny Branch;  Location: [facility];  Service: Ophthalmology;  Laterality: Right;  CDE 12.62  . Pars plana vitrectomy Right 02/12/2014    Procedure: PARS PLANA VITRECTOMY WITH 25 GAUGE;   Surgeon: [name], MD;  Location: [place];  Service: Ophthalmology;  Laterality: Right;  . Membrane peel Right 02/12/2014    Procedure: MEMBRANE PEEL;  Surgeon: [name], MD;  Location: [place];  Service: Ophthalmology;  Laterality: Right;     Family History  Problem Relation Age of Onset  . Hypotension Neg Hx   . Anesthesia problems Neg Hx   . Pseudochol deficiency Neg Hx   . Malignant hyperthermia Neg Hx   . Cancer Other     unknown cancer     Social History   Social History  . Marital Status: Married    Spouse Name: N/A  . Number of Children: N/A  . Years of Education: N/A   Occupational History  . Not on file.   Social History Main Topics  . Smoking status: Never Smoker   . Smokeless tobacco: Never Used  . Alcohol Use: No  . Drug Use: No  . Sexual Activity: Not on file   Other Topics Concern  . Not on file   Social History Narrative     BP 169/67 mmHg  Pulse 91  Ht 5\' 2"  (1.575 m)  Wt 197 lb (89.359 kg)  BMI 36.02 kg/m2  SpO2 94%  Physical Exam:  Well appearing 82 year old woman, obese,NAD HEENT: Unremarkable Neck:  6 cm JVD, no thyromegally Lungs:  Clear with no wheezes, rales, or rhonchi. HEART:  IRegular rate rhythm, no murmurs, no rubs, no clicks Abd:  soft, positive bowel sounds, no organomegally, no rebound, no guarding Ext:  2 plus pulses, no edema, no cyanosis, no clubbing Skin:  No rashes no nodules Neuro:  CN II through XII intact, motor grossly intact except for very minimal right HP.  ECG - atrial fib with a controlled VR  DEVICE  Normal device function.  See PaceArt for details.   Assess/Plan: 1. Atrial fib - her ventricular rate is well controlled. Will follow. 2. HTN - her blood pressure is elevated. She is encouraged to reduce her salt intake. I will defer uptitration of her bp meds to her primary MD. 3. PPM - her [employer]. Jude DDD PM is working normally. She has 7 years on her battery.  Mikle Bosworth.D.

## 2015-09-18 LAB — CUP PACEART INCLINIC DEVICE CHECK
Brady Statistic RV Percent Paced: 61 %
Implantable Lead Implant Date: 20100602
Implantable Lead Location: 753859
Lead Channel Impedance Value: 450 Ohm
Lead Channel Sensing Intrinsic Amplitude: 12 mV
MDC IDC LEAD IMPLANT DT: 20100602
MDC IDC LEAD LOCATION: 753860
MDC IDC MSMT BATTERY REMAINING LONGEVITY: 91 mo
MDC IDC MSMT BATTERY REMAINING PERCENTAGE: 73 %
MDC IDC MSMT BATTERY VOLTAGE: 2.92 V
MDC IDC MSMT LEADCHNL RV PACING THRESHOLD AMPLITUDE: 0.75 V
MDC IDC MSMT LEADCHNL RV PACING THRESHOLD PULSEWIDTH: 0.4 ms
MDC IDC SESS DTM: 20170413103010
Pulse Gen Model: 2210
Pulse Gen Serial Number: 2323973

## 2015-10-01 ENCOUNTER — Ambulatory Visit (INDEPENDENT_AMBULATORY_CARE_PROVIDER_SITE_OTHER): Payer: Medicare HMO | Admitting: Pharmacist

## 2015-10-01 DIAGNOSIS — Z5181 Encounter for therapeutic drug level monitoring: Secondary | ICD-10-CM | POA: Diagnosis not present

## 2015-10-01 DIAGNOSIS — I495 Sick sinus syndrome: Secondary | ICD-10-CM | POA: Diagnosis not present

## 2015-10-01 DIAGNOSIS — I639 Cerebral infarction, unspecified: Secondary | ICD-10-CM

## 2015-10-01 LAB — POCT INR: INR: 2.2

## 2015-10-07 ENCOUNTER — Ambulatory Visit: Payer: Medicare HMO | Admitting: Family Medicine

## 2015-10-14 ENCOUNTER — Other Ambulatory Visit: Payer: Self-pay | Admitting: Family Medicine

## 2015-10-24 ENCOUNTER — Encounter: Payer: Self-pay | Admitting: Family Medicine

## 2015-10-24 ENCOUNTER — Ambulatory Visit (INDEPENDENT_AMBULATORY_CARE_PROVIDER_SITE_OTHER): Payer: Medicare HMO | Admitting: Family Medicine

## 2015-10-24 VITALS — BP 136/70 | Temp 98.4°F | Ht 62.0 in | Wt 193.0 lb

## 2015-10-24 DIAGNOSIS — R05 Cough: Secondary | ICD-10-CM

## 2015-10-24 DIAGNOSIS — R059 Cough, unspecified: Secondary | ICD-10-CM

## 2015-10-24 DIAGNOSIS — J309 Allergic rhinitis, unspecified: Secondary | ICD-10-CM | POA: Diagnosis not present

## 2015-10-24 MED ORDER — HYDROCODONE-HOMATROPINE 5-1.5 MG/5ML PO SYRP: ORAL_SOLUTION | ORAL | Status: DC

## 2015-10-24 NOTE — Patient Instructions (Addendum)
Could be allergies or could be a virus  I do not find evidence of a bacteria, I would not recommend antibiotic at this point  Call back if worse  May use cough med at night time as needed ( caution drowsiness)  May use allergy med,Loratadine daily  If fevers or worse call or follow up

## 2015-10-24 NOTE — Progress Notes (Signed)
   Subjective:    Patient ID: [name], female    DOB: Oct 30, 1926, 82 y.o.   MRN: [id number]  Cough This is a new problem. Episode onset: 2 days ago. Associated symptoms include wheezing. Pertinent negatives include no chest pain, fever or rhinorrhea. Associated symptoms comments: Congestion, wheezing. Treatments tried: tylenol, hycodan.    Patient with intermittent runny nose postnasal drip cough no wheezing or difficulty breathing  Review of Systems  Constitutional: Negative for fever, diaphoresis and fatigue.  HENT: Positive for sneezing. Negative for congestion and rhinorrhea.   Respiratory: Positive for cough and wheezing.   Cardiovascular: Negative for chest pain.  Gastrointestinal: Negative for abdominal pain.       Objective:   Physical Exam  Constitutional: She appears well-developed.  HENT:  Head: Normocephalic.  Nose: Nose normal.  Mouth/Throat: Oropharynx is clear and moist. No oropharyngeal exudate.  Neck: Neck supple.  Cardiovascular: Normal rate and normal heart sounds.   No murmur heard. Pulmonary/Chest: Effort normal and breath sounds normal. She has no wheezes.  Lymphadenopathy:    She has no cervical adenopathy.  Skin: Skin is warm and dry.  Nursing note and vitals reviewed.  No respiratory distress no wheezing no crackles in the lungs patient not toxic       Assessment & Plan:  Allergic rhinitis over-the-counter allergy medicine recommended  Cough-cough medication recommended no antibiotics indicated currently if worsening symptoms call us warning signs regarding secondary infection were discussed

## 2015-11-05 ENCOUNTER — Ambulatory Visit (INDEPENDENT_AMBULATORY_CARE_PROVIDER_SITE_OTHER): Payer: Medicare HMO | Admitting: *Deleted

## 2015-11-05 DIAGNOSIS — Z5181 Encounter for therapeutic drug level monitoring: Secondary | ICD-10-CM

## 2015-11-05 DIAGNOSIS — I495 Sick sinus syndrome: Secondary | ICD-10-CM

## 2015-11-05 DIAGNOSIS — I639 Cerebral infarction, unspecified: Secondary | ICD-10-CM | POA: Diagnosis not present

## 2015-11-05 LAB — POCT INR: INR: 2.3

## 2015-11-29 ENCOUNTER — Other Ambulatory Visit: Payer: Self-pay | Admitting: Family Medicine

## 2015-12-03 DIAGNOSIS — R55 Syncope and collapse: Secondary | ICD-10-CM | POA: Diagnosis not present

## 2015-12-04 ENCOUNTER — Ambulatory Visit (INDEPENDENT_AMBULATORY_CARE_PROVIDER_SITE_OTHER): Payer: Medicare HMO | Admitting: Family Medicine

## 2015-12-04 ENCOUNTER — Encounter: Payer: Self-pay | Admitting: Family Medicine

## 2015-12-04 VITALS — BP 132/82 | Ht 62.0 in | Wt 193.0 lb

## 2015-12-04 DIAGNOSIS — R55 Syncope and collapse: Secondary | ICD-10-CM | POA: Diagnosis not present

## 2015-12-04 NOTE — Progress Notes (Signed)
   Subjective:    Patient ID: [name], female    DOB: 02/24/1927, 82 y.o.   MRN: [id number]  HPI  Patietn arrives with c/o passing out spell yesterday. Patient says she was cooking and her feet got tired and her feet hurt and she sat down and blacked out for a few seconds-EMS called and did ekg-patient with hx of a fib and pace [facility] cardiology and on blood thinner. Next  Patient does have history of prior cerebral infarction with a lacunar infarct. Next  Also complicated by known anticoagulation. Not experiencing headache at this time. Felt fatigued and dizzy immediately prior to passing out. Stated she may have been on her feet too long in the kitchen. Became week and then experienced a syncopal event  No seizure activity seen.  Also of note history of sick sinus syndrome the patient has pacemaker which is doing well and just recently interrogated by cardiologist Review of Systems No headache, no major weight loss or weight gain, no chest pain no back pain abdominal pain no change in bowel habits complete ROS otherwise negative     Objective:   Physical Exam  Alert vital stable pleasant no acute distress HEENT normal no focal neurological deficits lungs clear. Heart  Rhythm strip from EMS/cardiologist reviewed. Paced be no dropped beats      Assessment & Plan:  Impression syncopal event. May well in that not knowing what caused this particular 1. However patient does have multiple risk factors. Plan CT scan of head. Since just saw a cardiologist no need to go right back, however if these continue with definitely recommend return to the cardiologist [facility]

## 2015-12-08 ENCOUNTER — Ambulatory Visit (HOSPITAL_COMMUNITY)
Admission: RE | Admit: 2015-12-08 | Discharge: 2015-12-08 | Disposition: A | Discharge status: Home / Self Care | Payer: Medicare HMO | Source: Ambulatory Visit | Attending: Family Medicine | Admitting: Family Medicine

## 2015-12-08 ENCOUNTER — Other Ambulatory Visit: Payer: Self-pay | Admitting: Family Medicine

## 2015-12-08 DIAGNOSIS — R55 Syncope and collapse: Secondary | ICD-10-CM | POA: Insufficient documentation

## 2015-12-08 DIAGNOSIS — I708 Atherosclerosis of other arteries: Secondary | ICD-10-CM | POA: Insufficient documentation

## 2015-12-17 ENCOUNTER — Ambulatory Visit (INDEPENDENT_AMBULATORY_CARE_PROVIDER_SITE_OTHER): Payer: Medicare HMO | Admitting: *Deleted

## 2015-12-17 ENCOUNTER — Ambulatory Visit (INDEPENDENT_AMBULATORY_CARE_PROVIDER_SITE_OTHER): Payer: Medicare HMO | Admitting: Pharmacist

## 2015-12-17 DIAGNOSIS — I495 Sick sinus syndrome: Secondary | ICD-10-CM

## 2015-12-17 DIAGNOSIS — I639 Cerebral infarction, unspecified: Secondary | ICD-10-CM

## 2015-12-17 DIAGNOSIS — Z5181 Encounter for therapeutic drug level monitoring: Secondary | ICD-10-CM

## 2015-12-17 LAB — POCT INR: INR: 2.4

## 2015-12-17 NOTE — Progress Notes (Signed)
Remote pacemaker transmission.   

## 2015-12-19 ENCOUNTER — Encounter: Payer: Self-pay | Admitting: Cardiology

## 2015-12-23 LAB — CUP PACEART REMOTE DEVICE CHECK
Battery Voltage: 2.9 V
Brady Statistic RV Percent Paced: 62 %
Date Time Interrogation Session: 20170711111006
Implantable Lead Implant Date: 20100602
Implantable Lead Location: 753859
Implantable Lead Location: 753860
Lead Channel Impedance Value: 430 Ohm
Lead Channel Sensing Intrinsic Amplitude: 12 mV
Lead Channel Setting Pacing Pulse Width: 0.4 ms
MDC IDC LEAD IMPLANT DT: 20100602
MDC IDC MSMT BATTERY REMAINING LONGEVITY: 82 mo
MDC IDC MSMT BATTERY REMAINING PERCENTAGE: 65 %
MDC IDC MSMT LEADCHNL RV PACING THRESHOLD AMPLITUDE: 0.75 V
MDC IDC MSMT LEADCHNL RV PACING THRESHOLD PULSEWIDTH: 0.4 ms
MDC IDC SET LEADCHNL RV PACING AMPLITUDE: 2.5 V
MDC IDC SET LEADCHNL RV SENSING SENSITIVITY: 2 mV
Pulse Gen Model: 2210
Pulse Gen Serial Number: 2323973

## 2015-12-25 ENCOUNTER — Other Ambulatory Visit: Payer: Self-pay | Admitting: Internal Medicine

## 2015-12-25 ENCOUNTER — Encounter: Payer: Self-pay | Admitting: Family Medicine

## 2015-12-25 ENCOUNTER — Ambulatory Visit (INDEPENDENT_AMBULATORY_CARE_PROVIDER_SITE_OTHER): Payer: Medicare HMO | Admitting: Family Medicine

## 2015-12-25 VITALS — BP 128/70 | Ht 62.0 in | Wt 192.0 lb

## 2015-12-25 DIAGNOSIS — M1000 Idiopathic gout, unspecified site: Secondary | ICD-10-CM

## 2015-12-25 DIAGNOSIS — R739 Hyperglycemia, unspecified: Secondary | ICD-10-CM

## 2015-12-25 DIAGNOSIS — I10 Essential (primary) hypertension: Secondary | ICD-10-CM | POA: Diagnosis not present

## 2015-12-25 DIAGNOSIS — R7303 Prediabetes: Secondary | ICD-10-CM | POA: Insufficient documentation

## 2015-12-25 MED ORDER — ALLOPURINOL 100 MG PO TABS: ORAL_TABLET | ORAL | Status: DC

## 2015-12-25 NOTE — Progress Notes (Signed)
   Subjective:    Patient ID: [name], female    DOB: 09-Jun-1926, 82 y.o.   MRN: [id number]  HPIfollow up from passing out spell. Pt states she got too hot that day. Has not had any issues since then. Seen on 6/29 by dr Richardson Landry and Had ct head on 12/08/15.  Had some dizziness one day. Took meclizine and it helped.  No unilateral numbness or weakness Needs refill on gout med allopurinol.       Review of Systems Patient denies any chest tightness pressure pain shortness of breath    Objective:   Physical Exam Lungs clear hearts regular pulse normal BP good extremities no edema       Assessment & Plan:  Gout-continue medication. Check lab work  Blood pressure watch diet continue medication  Hyperglycemia watch diet closely. Try to minimize starches  Lab work pending await the results

## 2015-12-26 LAB — BASIC METABOLIC PANEL
BUN / CREAT RATIO: 10 — AB (ref 12–28)
BUN: 12 mg/dL (ref 8–27)
CO2: 25 mmol/L (ref 18–29)
CREATININE: 1.15 mg/dL — AB (ref 0.57–1.00)
Calcium: 10.2 mg/dL (ref 8.7–10.3)
Chloride: 100 mmol/L (ref 96–106)
GFR, EST AFRICAN AMERICAN: 50 mL/min/{1.73_m2} — AB (ref >59)
GFR, EST NON AFRICAN AMERICAN: 43 mL/min/{1.73_m2} — AB (ref >59)
Glucose: 108 mg/dL — ABNORMAL HIGH (ref 65–99)
Potassium: 4.3 mmol/L (ref 3.5–5.2)
SODIUM: 141 mmol/L (ref 134–144)

## 2015-12-26 LAB — URIC ACID: Uric Acid: 6.1 mg/dL (ref 2.5–7.1)

## 2015-12-26 LAB — HEMOGLOBIN A1C
Est. average glucose Bld gHb Est-mCnc: 146 mg/dL
Hgb A1c MFr Bld: 6.7 % — ABNORMAL HIGH (ref 4.8–5.6)

## 2016-01-08 ENCOUNTER — Other Ambulatory Visit: Payer: Self-pay | Admitting: Family Medicine

## 2016-01-27 ENCOUNTER — Other Ambulatory Visit: Payer: Self-pay | Admitting: Internal Medicine

## 2016-01-28 ENCOUNTER — Encounter: Payer: Self-pay | Admitting: *Deleted

## 2016-01-28 DIAGNOSIS — L84 Corns and callosities: Secondary | ICD-10-CM | POA: Diagnosis not present

## 2016-01-28 DIAGNOSIS — L602 Onychogryphosis: Secondary | ICD-10-CM | POA: Diagnosis not present

## 2016-02-02 ENCOUNTER — Other Ambulatory Visit: Payer: Self-pay | Admitting: Family Medicine

## 2016-02-02 ENCOUNTER — Ambulatory Visit (INDEPENDENT_AMBULATORY_CARE_PROVIDER_SITE_OTHER): Payer: Medicare HMO | Admitting: *Deleted

## 2016-02-02 DIAGNOSIS — I495 Sick sinus syndrome: Secondary | ICD-10-CM | POA: Diagnosis not present

## 2016-02-02 DIAGNOSIS — Z5181 Encounter for therapeutic drug level monitoring: Secondary | ICD-10-CM | POA: Diagnosis not present

## 2016-02-02 DIAGNOSIS — I639 Cerebral infarction, unspecified: Secondary | ICD-10-CM | POA: Diagnosis not present

## 2016-02-02 LAB — POCT INR: INR: 3.1

## 2016-02-12 ENCOUNTER — Other Ambulatory Visit: Payer: Self-pay | Admitting: Family Medicine

## 2016-03-01 ENCOUNTER — Ambulatory Visit (INDEPENDENT_AMBULATORY_CARE_PROVIDER_SITE_OTHER): Payer: Medicare HMO | Admitting: *Deleted

## 2016-03-01 DIAGNOSIS — Z5181 Encounter for therapeutic drug level monitoring: Secondary | ICD-10-CM | POA: Diagnosis not present

## 2016-03-01 DIAGNOSIS — I639 Cerebral infarction, unspecified: Secondary | ICD-10-CM | POA: Diagnosis not present

## 2016-03-01 DIAGNOSIS — I495 Sick sinus syndrome: Secondary | ICD-10-CM

## 2016-03-01 LAB — POCT INR: INR: 2.3

## 2016-03-09 ENCOUNTER — Other Ambulatory Visit: Payer: Self-pay | Admitting: Family Medicine

## 2016-03-17 ENCOUNTER — Ambulatory Visit (INDEPENDENT_AMBULATORY_CARE_PROVIDER_SITE_OTHER): Payer: Medicare HMO | Admitting: *Deleted

## 2016-03-17 DIAGNOSIS — I495 Sick sinus syndrome: Secondary | ICD-10-CM

## 2016-03-17 NOTE — Progress Notes (Signed)
Remote pacemaker transmission.   

## 2016-03-18 ENCOUNTER — Encounter: Payer: Self-pay | Admitting: Cardiology

## 2016-03-26 ENCOUNTER — Ambulatory Visit (INDEPENDENT_AMBULATORY_CARE_PROVIDER_SITE_OTHER): Payer: Medicare HMO | Admitting: Family Medicine

## 2016-03-26 ENCOUNTER — Encounter: Payer: Self-pay | Admitting: Family Medicine

## 2016-03-26 VITALS — BP 140/70 | Ht 62.0 in | Wt 193.0 lb

## 2016-03-26 DIAGNOSIS — E784 Other hyperlipidemia: Secondary | ICD-10-CM

## 2016-03-26 DIAGNOSIS — E7849 Other hyperlipidemia: Secondary | ICD-10-CM

## 2016-03-26 DIAGNOSIS — E119 Type 2 diabetes mellitus without complications: Secondary | ICD-10-CM

## 2016-03-26 DIAGNOSIS — M1000 Idiopathic gout, unspecified site: Secondary | ICD-10-CM | POA: Diagnosis not present

## 2016-03-26 DIAGNOSIS — Z79899 Other long term (current) drug therapy: Secondary | ICD-10-CM | POA: Diagnosis not present

## 2016-03-26 DIAGNOSIS — M5431 Sciatica, right side: Secondary | ICD-10-CM

## 2016-03-26 DIAGNOSIS — I4891 Unspecified atrial fibrillation: Secondary | ICD-10-CM | POA: Diagnosis not present

## 2016-03-26 DIAGNOSIS — I10 Essential (primary) hypertension: Secondary | ICD-10-CM

## 2016-03-26 NOTE — Progress Notes (Signed)
   Subjective:    Patient ID: [name], female    DOB: Oct 05, 1926, 82 y.o.   MRN: [id number]  Hypertension  This is a chronic problem. The current episode started more than 1 year ago. The problem has been gradually improving since onset. There are no associated agents to hypertension. There are no known risk factors for coronary artery disease. Treatments tried: lisinopril. The current treatment provides moderate improvement. There are no compliance problems.    Patient has low back pain that radiates down the right leg. Onset 2 weeks ago. Treatments tried: none  Has history of prediabetes last A1c was elevated at 6.7 she states she does try to watch his starches in her dying tries to stay somewhat active. She denies any gout flareups She denies any chest tightness pressure pain or congestive heart failure symptoms She takes her cholesterol medicine on a regular basis relates tolerates it. She uses Coumadin on a regular basis follows up with the cardiac Coumadin clinic. Review of Systems Denies chest tightness pressure pain shortness breath relates low back pain on the right lower back radiates down the leg uses a cane to walk.    Objective:   Physical Exam Lungs are clear heart irregular rate controlled extremities no edema skin warm dry neurologic gross normal patient can walk fairly well with a cane for her age   63 minutes was spent with the patient. Greater than half the time was spent in discussion and answering questions and counseling regarding the issues that the patient came in for today.     Assessment & Plan:  Low back pain possible pinched nerve Tylenol when necessary hold off on any other medicines hold off on any testing if worse over the next month follow-up may need some testing gentle range of motion excises  Blood pressure good control continue current measures  Diabetes check A1c  Hyperlipidemia continue medication previous labs reviewed check lab  work  Gout no flareup recently check lab work continue allopurinol  Atrial fib ablation under good control continue Coumadin no signs of bleeding follow-up with Coumadin clinic on regular basis  Follow-up here in 6 months.

## 2016-03-29 DIAGNOSIS — Z79899 Other long term (current) drug therapy: Secondary | ICD-10-CM | POA: Diagnosis not present

## 2016-03-29 DIAGNOSIS — M1000 Idiopathic gout, unspecified site: Secondary | ICD-10-CM | POA: Diagnosis not present

## 2016-03-29 DIAGNOSIS — E119 Type 2 diabetes mellitus without complications: Secondary | ICD-10-CM | POA: Diagnosis not present

## 2016-03-29 DIAGNOSIS — E784 Other hyperlipidemia: Secondary | ICD-10-CM | POA: Diagnosis not present

## 2016-03-30 LAB — URIC ACID: Uric Acid: 5.8 mg/dL (ref 2.5–7.1)

## 2016-03-30 LAB — BASIC METABOLIC PANEL
BUN / CREAT RATIO: 20 (ref 12–28)
BUN: 25 mg/dL (ref 8–27)
CHLORIDE: 103 mmol/L (ref 96–106)
CO2: 25 mmol/L (ref 18–29)
Calcium: 10.7 mg/dL — ABNORMAL HIGH (ref 8.7–10.3)
Creatinine, Ser: 1.23 mg/dL — ABNORMAL HIGH (ref 0.57–1.00)
GFR calc non Af Amer: 40 mL/min/{1.73_m2} — ABNORMAL LOW (ref >59)
GFR, EST AFRICAN AMERICAN: 46 mL/min/{1.73_m2} — AB (ref >59)
Glucose: 131 mg/dL — ABNORMAL HIGH (ref 65–99)
POTASSIUM: 4.8 mmol/L (ref 3.5–5.2)
Sodium: 142 mmol/L (ref 134–144)

## 2016-03-30 LAB — LIPID PANEL
CHOLESTEROL TOTAL: 118 mg/dL (ref 100–199)
Chol/HDL Ratio: 2.1 ratio units (ref 0.0–4.4)
HDL: 55 mg/dL (ref >39)
LDL Calculated: 45 mg/dL (ref 0–99)
TRIGLYCERIDES: 92 mg/dL (ref 0–149)
VLDL Cholesterol Cal: 18 mg/dL (ref 5–40)

## 2016-03-30 LAB — HEPATIC FUNCTION PANEL
ALT: 10 IU/L (ref 0–32)
AST: 14 IU/L (ref 0–40)
Albumin: 4.2 g/dL (ref 3.5–4.7)
Alkaline Phosphatase: 76 IU/L (ref 39–117)
BILIRUBIN TOTAL: 0.3 mg/dL (ref 0.0–1.2)
BILIRUBIN, DIRECT: 0.12 mg/dL (ref 0.00–0.40)
Total Protein: 6.8 g/dL (ref 6.0–8.5)

## 2016-03-30 LAB — HEMOGLOBIN A1C
Est. average glucose Bld gHb Est-mCnc: 151 mg/dL
Hgb A1c MFr Bld: 6.9 % — ABNORMAL HIGH (ref 4.8–5.6)

## 2016-04-05 ENCOUNTER — Ambulatory Visit (INDEPENDENT_AMBULATORY_CARE_PROVIDER_SITE_OTHER): Payer: Medicare HMO | Admitting: *Deleted

## 2016-04-05 DIAGNOSIS — Z23 Encounter for immunization: Secondary | ICD-10-CM

## 2016-04-05 NOTE — Addendum Note (Signed)
Addended by: Ofilia Neas R on: 04/05/2016 02:41 PM   Modules accepted: Orders

## 2016-04-07 LAB — PTH, INTACT AND CALCIUM
CALCIUM: 10 mg/dL (ref 8.7–10.3)
PTH: 80 pg/mL — AB (ref 15–65)

## 2016-04-07 LAB — VITAMIN D 25 HYDROXY (VIT D DEFICIENCY, FRACTURES): Vit D, 25-Hydroxy: 8.1 ng/mL — ABNORMAL LOW (ref 30.0–100.0)

## 2016-04-08 MED ORDER — VITAMIN D (ERGOCALCIFEROL) 1.25 MG (50000 UNIT) PO CAPS: 50000.0000 [IU] | ORAL_CAPSULE | ORAL | 1 refills | Status: DC

## 2016-04-08 NOTE — Addendum Note (Signed)
Addended by: Dairl Ponder on: 04/08/2016 10:53 AM   Modules accepted: Orders

## 2016-04-12 ENCOUNTER — Other Ambulatory Visit: Payer: Self-pay | Admitting: Family Medicine

## 2016-04-12 ENCOUNTER — Ambulatory Visit (INDEPENDENT_AMBULATORY_CARE_PROVIDER_SITE_OTHER): Payer: Medicare HMO | Admitting: *Deleted

## 2016-04-12 DIAGNOSIS — I495 Sick sinus syndrome: Secondary | ICD-10-CM

## 2016-04-12 DIAGNOSIS — I639 Cerebral infarction, unspecified: Secondary | ICD-10-CM | POA: Diagnosis not present

## 2016-04-12 DIAGNOSIS — Z5181 Encounter for therapeutic drug level monitoring: Secondary | ICD-10-CM | POA: Diagnosis not present

## 2016-04-12 LAB — POCT INR: INR: 3

## 2016-05-05 DIAGNOSIS — Z6838 Body mass index (BMI) 38.0-38.9, adult: Secondary | ICD-10-CM | POA: Diagnosis not present

## 2016-05-05 DIAGNOSIS — I4892 Unspecified atrial flutter: Secondary | ICD-10-CM | POA: Diagnosis not present

## 2016-05-05 DIAGNOSIS — I10 Essential (primary) hypertension: Secondary | ICD-10-CM | POA: Diagnosis not present

## 2016-05-05 DIAGNOSIS — K219 Gastro-esophageal reflux disease without esophagitis: Secondary | ICD-10-CM | POA: Diagnosis not present

## 2016-05-05 DIAGNOSIS — E785 Hyperlipidemia, unspecified: Secondary | ICD-10-CM | POA: Diagnosis not present

## 2016-05-05 DIAGNOSIS — Z0000 Encounter for general adult medical examination without abnormal findings: Secondary | ICD-10-CM | POA: Diagnosis not present

## 2016-05-05 DIAGNOSIS — I4891 Unspecified atrial fibrillation: Secondary | ICD-10-CM | POA: Diagnosis not present

## 2016-05-10 ENCOUNTER — Emergency Department (HOSPITAL_COMMUNITY): Payer: Medicare HMO

## 2016-05-10 ENCOUNTER — Encounter (HOSPITAL_COMMUNITY): Payer: Self-pay | Admitting: Emergency Medicine

## 2016-05-10 ENCOUNTER — Emergency Department (HOSPITAL_COMMUNITY)
Admission: EM | Admit: 2016-05-10 | Discharge: 2016-05-10 | Disposition: A | Discharge status: Home / Self Care | Payer: Medicare HMO | Attending: Emergency Medicine | Admitting: Emergency Medicine

## 2016-05-10 DIAGNOSIS — Y929 Unspecified place or not applicable: Secondary | ICD-10-CM | POA: Insufficient documentation

## 2016-05-10 DIAGNOSIS — R05 Cough: Secondary | ICD-10-CM | POA: Insufficient documentation

## 2016-05-10 DIAGNOSIS — R0602 Shortness of breath: Secondary | ICD-10-CM | POA: Diagnosis not present

## 2016-05-10 DIAGNOSIS — Z79899 Other long term (current) drug therapy: Secondary | ICD-10-CM | POA: Diagnosis not present

## 2016-05-10 DIAGNOSIS — S92902A Unspecified fracture of left foot, initial encounter for closed fracture: Secondary | ICD-10-CM

## 2016-05-10 DIAGNOSIS — M549 Dorsalgia, unspecified: Secondary | ICD-10-CM | POA: Diagnosis not present

## 2016-05-10 DIAGNOSIS — M79672 Pain in left foot: Secondary | ICD-10-CM | POA: Diagnosis not present

## 2016-05-10 DIAGNOSIS — Y939 Activity, unspecified: Secondary | ICD-10-CM | POA: Insufficient documentation

## 2016-05-10 DIAGNOSIS — R062 Wheezing: Secondary | ICD-10-CM | POA: Diagnosis not present

## 2016-05-10 DIAGNOSIS — I10 Essential (primary) hypertension: Secondary | ICD-10-CM | POA: Diagnosis not present

## 2016-05-10 DIAGNOSIS — S99922A Unspecified injury of left foot, initial encounter: Secondary | ICD-10-CM | POA: Diagnosis present

## 2016-05-10 DIAGNOSIS — M7989 Other specified soft tissue disorders: Secondary | ICD-10-CM | POA: Diagnosis not present

## 2016-05-10 DIAGNOSIS — Y999 Unspecified external cause status: Secondary | ICD-10-CM | POA: Diagnosis not present

## 2016-05-10 DIAGNOSIS — W1839XA Other fall on same level, initial encounter: Secondary | ICD-10-CM | POA: Diagnosis not present

## 2016-05-10 DIAGNOSIS — S92212A Displaced fracture of cuboid bone of left foot, initial encounter for closed fracture: Secondary | ICD-10-CM | POA: Diagnosis not present

## 2016-05-10 NOTE — ED Notes (Signed)
Called to triage x 1with no answer  

## 2016-05-10 NOTE — Discharge Instructions (Signed)
Use the Cam Walker. Make appointment to follow-up with orthopedics. X-rays very suggestive of cuboid fracture in the left foot.

## 2016-05-10 NOTE — ED Notes (Signed)
Patient was unable to ambulate with a cam walker. Post op shoe applied

## 2016-05-10 NOTE — ED Provider Notes (Signed)
[facility] DEPT Provider Note   CSN: [id number] Arrival date & time: 05/10/16  1451 By signing my name below, I, [name], attest that this documentation has been prepared under the direction and in the presence of Meghan Sorrow, MD . Electronically Signed: Dyke White, Scribe. 05/10/2016. 9:53 PM.   History   Chief Complaint Chief Complaint  Patient presents with  . Fall    HPI [name] is a 82 y.o. female who presents to the Emergency Department complaining of gradual onset, worsening left foot pain which began a few days s/p fall on 04/29/16. Pt states she got "tangled up in her shoes" and fell, injuring her left foot. She notes associated leg swelling, back pain, cough, SOB, and wheezing. No alleviating or modifying factors noted. Pt uses a cane chronically due to a stroke that affected her left leg. She endorses use of blood thinners. Pt denies any fever, chills, rhinorrhea, sore throat, visual disturbance, CP, n/v/d, abdominal pain, dysuria, hematuria, or headaches.   Pt is followed by [facility] for orthopedics  The history is provided by the patient. No language interpreter was used.   Past Medical History:  Diagnosis Date  . Anemia    H/H of 11.3/36.7 in 12/2008 ;normal MCV; normal CBC in 2011  . Angioedema 08/2006  . Atrial fibrillation (HCC)    persistant  . Blood transfusion   . 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 Chronic bronchitis   . Chronic renal insufficiency    baseline creatine 1.4; 1.04 in 03/2010  . DJD (degenerative joint disease)    hands, knees  . Gout   . Hyperlipidemia   . Hypertension    heart disease diastolic dysfunction ; SL-37%; mild pulmonary edema in 2006;responded to diurectics ; LVH  . Kidney stones   .  Mitral valve disease    Severe mitral annular calcification; mild to moderate stenosis-not clearly rheumatic  . Nephrolithiasis   . Pacemaker   . Retinal hemorrhage   . Seizure disorder ([place])   . Shortness of breath    only with exertion  . Stroke ([place])   . Tachycardia-bradycardia syndrome ([place])    with pauses and syncope;PPM implantation 10/2008,negative stress nuclear study 03/2005  . Vertigo     Patient Active Problem List   Diagnosis Date Noted  . Prediabetes 12/25/2015  . 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 Vitreous hemorrhage ([place]) 02/12/2014  . TIA (transient ischemic attack) 08/20/2013  . Encounter for therapeutic drug monitoring 06/28/2013  . Gout 11/14/2012  . Fasting hyperglycemia 04/06/2012  . Atrial fibrillation ([place]) 08/31/2011  . Chronic anticoagulation 12/16/2010  . 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 Cerebrovascular accident ([place])   . DJD (degenerative joint disease)   . Hypertension 02/21/2009  . Sick sinus syndrome ([place]) 02/21/2009  . Hyperlipidemia 11/26/2008  . SEIZURE DISORDER 10/31/2008    Past Surgical History:  Procedure Laterality Date  . CATARACT EXTRACTION W/PHACO  03/29/2011   Procedure: CATARACT EXTRACTION PHACO AND INTRAOCULAR LENS PLACEMENT (IOC);  Surgeon: Tonny Branch;  Location: [facility];  Service: Ophthalmology;  Laterality: Right;  CDE 12.62  . INSERT / REPLACE / REMOVE PACEMAKER  11/2008   St.Jude DUAL chamber pacemaker 11/06/2008  . MEMBRANE PEEL Right 02/12/2014   Procedure: MEMBRANE PEEL;  Surgeon: [name], MD;  Location: [facility]  OR;  Service: Ophthalmology;  Laterality: Right;  . PARS PLANA VITRECTOMY Right 02/12/2014   Procedure: PARS PLANA VITRECTOMY WITH 25 GAUGE;  Surgeon: [name], MD;  Location: [place];  Service: Ophthalmology;  Laterality: Right;  . TOTAL KNEE ARTHROPLASTY  04/2009   Right    OB History    Gravida Para Term Preterm AB Living   5 5 5     5    SAB TAB Ectopic Multiple Live Births                    Home Medications    Prior to Admission medications   Medication Sig Start Date End Date Taking? Authorizing Provider  acetaminophen (TYLENOL) 325 MG tablet Take 325 mg by mouth every 6 (six) hours as needed for moderate pain.   Yes Historical Provider, MD  albuterol (PROVENTIL HFA;VENTOLIN HFA) 108 (90 BASE) MCG/ACT inhaler Inhale 2 puffs into the lungs every 6 (six) hours as needed for wheezing or shortness of breath.   Yes Historical Provider, MD  allopurinol (ZYLOPRIM) 100 MG tablet TAKE ONE TABLET DAILY TO PREVENT GOUT. Patient taking differently: Take 100 mg by mouth daily. TO PREVENT GOUT. 12/25/15  Yes Kathyrn Drown, MD  diltiazem (TIAZAC) 360 MG 24 hr capsule TAKE ONE CAPSULE BY MOUTH ONCE DAILY. 12/25/15  Yes Evans Lance, MD  dorzolamide-timolol (COSOPT) 22.3-6.8 MG/ML ophthalmic solution Place 1 drop into both eyes at bedtime.    Yes Historical Provider, MD  furosemide (LASIX) 20 MG tablet TAKE ONE TABLET BY MOUTH ONCE DAILY. 02/12/16  Yes Kathyrn Drown, MD  lisinopril (PRINIVIL,ZESTRIL) 20 MG tablet TAKE ONE TABLET BY MOUTH ONCE DAILY. 01/27/16  Yes Evans Lance, MD  meclizine (ANTIVERT) 25 MG tablet Take 25 mg by mouth 3 (three) times daily as needed. Dizziness 11/11/10  Yes Imogene Burn, PA-C  pravastatin (PRAVACHOL) 80 MG tablet TAKE (1) TABLET BY MOUTH AT BEDTIME. 04/12/16  Yes Kathyrn Drown, MD  ranitidine (ZANTAC) 150 MG tablet Take 150 mg by mouth at bedtime.    Yes Historical Provider, MD  warfarin (COUMADIN) 5 MG tablet Take 0.5-1 tablets (2.5-5 mg total) by mouth daily. Patient taking differently: Take 2.5-5 mg by mouth daily. Takes 2.5mg  on all days. Take 5mg  on Wednesdays only 08/22/15  Yes Evans Lance, MD  Vitamin D, Ergocalciferol, (DRISDOL) 50000 units CAPS capsule Take 1 capsule (50,000 Units total) by mouth every 7 (seven) days. Patient not taking: Reported on 05/10/2016 04/08/16   Meghan Kirschner, MD    Family History Family History  Problem Relation  Age of Onset  . Cancer Other     unknown cancer  . Hypotension Neg Hx   . Anesthesia problems Neg Hx   . Pseudochol deficiency Neg Hx   . Malignant hyperthermia Neg Hx     Social History Social History  Substance Use Topics  . Smoking status: Never Smoker  . Smokeless tobacco: Never Used  . Alcohol use No     Allergies   Patient has no known allergies.   Review of Systems Review of Systems  Constitutional: Negative for chills and fever.  HENT: Negative for rhinorrhea and sore throat.   Eyes: Negative for visual disturbance.  Respiratory: Positive for cough, shortness of breath and wheezing.   Cardiovascular: Positive for leg swelling. Negative for chest pain.  Gastrointestinal: Negative for abdominal pain, diarrhea, nausea and vomiting.  Genitourinary: Negative for dysuria and hematuria.  Musculoskeletal: Positive for back  pain.  Skin: Negative for rash.  Neurological: Negative for headaches.  Hematological: Bruises/bleeds easily.  Psychiatric/Behavioral: Negative for confusion.   Physical Exam Updated Vital Signs BP 161/58 (BP Location: Left Arm)   Pulse 65   Temp 97.6 F (36.4 C) (Oral)   Resp 16   Ht 5\' 2"  (1.575 m)   Wt 83.9 kg   SpO2 96%   BMI 33.84 kg/m   Physical Exam  Constitutional: She is oriented to person, place, and time. She appears well-developed and well-nourished. No distress.  HENT:  Head: Normocephalic and atraumatic.  Moist mucus membranes  Eyes: Conjunctivae and EOM are normal. Pupils are equal, round, and reactive to light. No scleral icterus.  Cardiovascular: Normal rate.   Pulmonary/Chest: Effort normal.  Lungs are clear bilaterally. RA SO2 98% during exam  Abdominal: Bowel sounds are normal. She exhibits no distension. There is no tenderness.  Musculoskeletal:  No swelling in the ankles; deformity on the medial part of the ankle. Tender across the forefoot; slight pitting edema on the left forefoot. Sensation is intact.    Neurological: She is alert and oriented to person, place, and time. No cranial nerve deficit or sensory deficit. She exhibits normal muscle tone. Coordination normal.  Skin: Skin is warm and dry.  Cap refill to bilateral toes is about 2 seconds  Psychiatric: She has a normal mood and affect.  Nursing note and vitals reviewed.  ED Treatments / Results  DIAGNOSTIC STUDIES:  Oxygen Saturation is 98% on RA, normal by my interpretation.    COORDINATION OF CARE:  9:35 PM Discussed treatment plan with pt at bedside and pt agreed to plan.  Labs (all labs ordered are listed, but only abnormal results are displayed) Labs Reviewed - No data to display  EKG  EKG Interpretation None       Radiology Dg Foot Complete Left  Result Date: 05/10/2016 CLINICAL DATA:  Fall 10 days ago. Left foot pain and swelling since. History of gout. EXAM: LEFT FOOT - COMPLETE 3+ VIEW COMPARISON:  09/27/2014 FINDINGS: There is a subtle sclerotic line across the anterior cuboid, which was not evident on the prior exam. This could potentially reflect an incomplete fracture. There is no other evidence of a fracture. The joints are normally aligned. There is a pes planus deformity which is stable from the prior study. A small plantar calcaneal spurs also stable. Ossification is noted in the Achilles tendon, unchanged. There are dense arterial calcifications. Subcutaneous edema is noted along the dorsal foot. IMPRESSION: 1. Equivocal evidence of an incomplete fracture or stress fracture of the cuboid. 2. No other evidence of a fracture.  No dislocation. Electronically Signed   By: Lajean Manes M.D.   On: 05/10/2016 15:50    Procedures Procedures (including critical care time)  Medications Ordered in ED Medications - No data to display   Initial Impression / Assessment and Plan / ED Course  I have reviewed the triage vital signs and the nursing notes.  Pertinent labs & imaging results that were available during  my care of the patient were reviewed by me and considered in my medical decision making (see chart for details).  Clinical Course     Patient with a fall on Thanksgiving day. No loss of consciousness no syncope. 2 days later started having swelling in the left foot. Patient's had a previous stroke that is affected some of her strength in that left leg. Does have sensation. Past few days swelling got worse. Patient came in  for evaluation was concerned about possible fracture. No other injuries. X-rays raise high suspicion for cuboid fracture of the left foot. Patient would not do well with crutches so be treated with a Cam Walker and follow-up with orthopedics. Patient is followed by Dr. Aline Brochure in the past. No other significant injuries.  Final Clinical Impressions(s) / ED Diagnoses   Final diagnoses:  Closed fracture of left foot, initial encounter    New Prescriptions New Prescriptions   No medications on file  I personally performed the services described in this documentation, which was scribed in my presence. The recorded information has been reviewed and is accurate.   }    Meghan Sorrow, MD 05/10/16 2212

## 2016-05-10 NOTE — ED Triage Notes (Signed)
PT states she fell and hurt her left food on Thanksgiving day and since accident has been able to walk but has experienced swelling to left foot.

## 2016-05-12 ENCOUNTER — Ambulatory Visit (INDEPENDENT_AMBULATORY_CARE_PROVIDER_SITE_OTHER): Payer: Medicare HMO | Admitting: Orthopedic Surgery

## 2016-05-12 ENCOUNTER — Encounter: Payer: Self-pay | Admitting: Orthopedic Surgery

## 2016-05-12 VITALS — BP 170/58 | HR 77 | Ht 62.0 in | Wt 193.0 lb

## 2016-05-12 DIAGNOSIS — S92212A Displaced fracture of cuboid bone of left foot, initial encounter for closed fracture: Secondary | ICD-10-CM

## 2016-05-12 NOTE — Progress Notes (Signed)
Patient ID: [name], female   DOB: 03/10/1927, 82 y.o.   MRN: [id number]  Chief Complaint  Patient presents with  . Foot Injury    LEFT FOOT FRACTURE DOI 04/29/16    HPI [name] is a 82 y.o. female.  82 year old female fell around Thanksgiving injured her left foot complains of pain and swelling over the dorsum of the foot which is constant but getting better. It's a dull ache with associated swelling  Review of Systems Review of Systems Denies shortness of breath or chest pain denies fever  Past Medical History:  Diagnosis Date  . Anemia    H/H of 11.3/36.7 in 12/2008 ;normal MCV; normal CBC in 2011  . Angioedema 08/2006  . Atrial fibrillation (HCC)    persistant  . Blood transfusion   . 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 Chronic bronchitis   . Chronic renal insufficiency    baseline creatine 1.4; 1.04 in 03/2010  . DJD (degenerative joint disease)    hands, knees  . Gout   . Hyperlipidemia   . Hypertension    heart disease diastolic dysfunction ; CB-44%; mild pulmonary edema in 2006;responded to diurectics ; LVH  . Kidney stones   . Mitral valve disease    Severe mitral annular calcification; mild to moderate stenosis-not clearly rheumatic  . Nephrolithiasis   . Pacemaker   . Retinal hemorrhage   . Seizure disorder ([place])   . Shortness of breath    only with exertion  . Stroke ([place])   . Tachycardia-bradycardia syndrome ([place])    with pauses and syncope;PPM implantation 10/2008,negative stress nuclear study 03/2005  . Vertigo     Past Surgical History:  Procedure Laterality Date  . CATARACT EXTRACTION W/PHACO  03/29/2011   Procedure: CATARACT EXTRACTION PHACO AND INTRAOCULAR LENS PLACEMENT (IOC);  Surgeon: Tonny Branch;  Location: [facility];   Service: Ophthalmology;  Laterality: Right;  CDE 12.62  . INSERT / REPLACE / REMOVE PACEMAKER  11/2008   St.Jude DUAL chamber pacemaker 11/06/2008  . MEMBRANE PEEL Right 02/12/2014   Procedure: MEMBRANE PEEL;  Surgeon: [name], MD;  Location: [place];  Service: Ophthalmology;  Laterality: Right;  . PARS PLANA VITRECTOMY Right 02/12/2014   Procedure: PARS PLANA VITRECTOMY WITH 25 GAUGE;  Surgeon: [name], MD;  Location: [place];  Service: Ophthalmology;  Laterality: Right;  . TOTAL KNEE ARTHROPLASTY  04/2009   Right    Social History Social History  Substance Use Topics  . Smoking status: Never Smoker  . Smokeless tobacco: Never Used  . Alcohol use No    No Known Allergies  Current Meds  Medication Sig  . acetaminophen (TYLENOL) 325 MG tablet Take 325 mg by mouth every 6 (six) hours as needed for moderate pain.  Marland Kitchen albuterol (PROVENTIL HFA;VENTOLIN HFA) 108 (90 BASE) MCG/ACT inhaler Inhale 2 puffs into the lungs every 6 (six) hours as needed for wheezing or shortness of breath.  . allopurinol (ZYLOPRIM) 100 MG tablet TAKE ONE TABLET DAILY TO PREVENT GOUT. (Patient taking differently: Take 100 mg by mouth daily. TO PREVENT GOUT.)  . diltiazem (TIAZAC) 360 MG 24 hr capsule TAKE ONE CAPSULE BY MOUTH ONCE DAILY.  Marland Kitchen dorzolamide-timolol (COSOPT) 22.3-6.8 MG/ML ophthalmic solution Place 1 drop into both eyes at bedtime.   Marland Kitchen  furosemide (LASIX) 20 MG tablet TAKE ONE TABLET BY MOUTH ONCE DAILY.  Marland Kitchen lisinopril (PRINIVIL,ZESTRIL) 20 MG tablet TAKE ONE TABLET BY MOUTH ONCE DAILY.  . meclizine (ANTIVERT) 25 MG tablet Take 25 mg by mouth 3 (three) times daily as needed. Dizziness  . pravastatin (PRAVACHOL) 80 MG tablet TAKE (1) TABLET BY MOUTH AT BEDTIME.  . ranitidine (ZANTAC) 150 MG tablet Take 150 mg by mouth at bedtime.   . Vitamin D, Ergocalciferol, (DRISDOL) 50000 units CAPS capsule Take 1 capsule (50,000 Units total) by mouth every 7 (seven) days.  Marland Kitchen warfarin (COUMADIN) 5 MG tablet Take  0.5-1 tablets (2.5-5 mg total) by mouth daily. (Patient taking differently: Take 2.5-5 mg by mouth daily. Takes 2.5mg  on all days. Take 5mg  on Wednesdays only)      Physical Exam Physical Exam BP (!) 170/58   Pulse 77   Ht 5\' 2"  (1.575 m)   Wt 193 lb (87.5 kg)   BMI 35.30 kg/m   Gen. appearance. The patient is well-developed and well-nourished, grooming and hygiene are normal. There are no gross congenital abnormalities  The patient is alert and oriented to person place and time  Mood and affect are normal  Ambulation ambulation is with a cane and a limp favoring the left leg with a postop shoe on the left foot  Examination reveals the following: On inspection we find tenderness and swelling in the foot primarily over the cuboid  With the range of motion of  left ankle diminished with pes planus fixed  Stability tests were normal  at the ankle joint  Motor exam no atrophy   Skin we find no rash ulceration or erythema  Sensation remains intact  Impression vascular system shows no peripheral edema  Data Reviewed X-ray shows osteoarthritis flatfoot and fracture of the cuboid  Assessment    Cuboid fracture    Plan    Hard sole shoe x-ray 4 weeks       Arther Abbott 05/12/2016, 11:39 AM

## 2016-05-12 NOTE — Patient Instructions (Signed)
WEAR SHOE X 4 WEEKS  

## 2016-05-24 ENCOUNTER — Ambulatory Visit (INDEPENDENT_AMBULATORY_CARE_PROVIDER_SITE_OTHER): Payer: Medicare HMO | Admitting: *Deleted

## 2016-05-24 ENCOUNTER — Other Ambulatory Visit: Payer: Self-pay | Admitting: Internal Medicine

## 2016-05-24 DIAGNOSIS — I495 Sick sinus syndrome: Secondary | ICD-10-CM

## 2016-05-24 DIAGNOSIS — I639 Cerebral infarction, unspecified: Secondary | ICD-10-CM | POA: Diagnosis not present

## 2016-05-24 DIAGNOSIS — Z5181 Encounter for therapeutic drug level monitoring: Secondary | ICD-10-CM | POA: Diagnosis not present

## 2016-05-24 LAB — POCT INR: INR: 1.9

## 2016-06-11 ENCOUNTER — Ambulatory Visit (INDEPENDENT_AMBULATORY_CARE_PROVIDER_SITE_OTHER): Payer: Self-pay | Admitting: Orthopedic Surgery

## 2016-06-11 ENCOUNTER — Encounter: Payer: Self-pay | Admitting: Orthopedic Surgery

## 2016-06-11 ENCOUNTER — Ambulatory Visit (INDEPENDENT_AMBULATORY_CARE_PROVIDER_SITE_OTHER): Payer: Medicare HMO

## 2016-06-11 DIAGNOSIS — S92212D Displaced fracture of cuboid bone of left foot, subsequent encounter for fracture with routine healing: Secondary | ICD-10-CM

## 2016-06-11 NOTE — Progress Notes (Signed)
Chief Complaint  Patient presents with  . Follow-up    LEFT FOOT FRACTURE, DOI 04/29/16    Cuboid fracture left foot patient has no complaints ambulating without any pain  X-ray shows osteopenia but healed fracture  Clinical exam patient nontender  Patient released

## 2016-06-16 ENCOUNTER — Ambulatory Visit (INDEPENDENT_AMBULATORY_CARE_PROVIDER_SITE_OTHER): Payer: Medicare HMO | Admitting: *Deleted

## 2016-06-16 DIAGNOSIS — I495 Sick sinus syndrome: Secondary | ICD-10-CM | POA: Diagnosis not present

## 2016-06-16 NOTE — Progress Notes (Signed)
Remote pacemaker transmission.   

## 2016-06-17 ENCOUNTER — Encounter: Payer: Self-pay | Admitting: Cardiology

## 2016-06-21 ENCOUNTER — Ambulatory Visit (INDEPENDENT_AMBULATORY_CARE_PROVIDER_SITE_OTHER): Payer: Medicare HMO | Admitting: *Deleted

## 2016-06-21 DIAGNOSIS — I495 Sick sinus syndrome: Secondary | ICD-10-CM

## 2016-06-21 DIAGNOSIS — Z5181 Encounter for therapeutic drug level monitoring: Secondary | ICD-10-CM

## 2016-06-21 DIAGNOSIS — I639 Cerebral infarction, unspecified: Secondary | ICD-10-CM

## 2016-06-21 LAB — POCT INR: INR: 1.9

## 2016-06-26 LAB — PTH, INTACT AND CALCIUM
CALCIUM: 10.5 mg/dL — AB (ref 8.7–10.3)
PTH: 70 pg/mL — ABNORMAL HIGH (ref 15–65)

## 2016-07-01 LAB — CUP PACEART REMOTE DEVICE CHECK
Battery Remaining Longevity: 71 mo
Battery Remaining Percentage: 57 %
Brady Statistic RV Percent Paced: 65 %
Date Time Interrogation Session: 20180110084840
Implantable Lead Implant Date: 20100602
Implantable Lead Implant Date: 20100602
Implantable Lead Location: 753859
Implantable Pulse Generator Implant Date: 20100602
Lead Channel Pacing Threshold Amplitude: 0.75 V
Lead Channel Setting Pacing Amplitude: 2.5 V
Lead Channel Setting Sensing Sensitivity: 2 mV
MDC IDC LEAD LOCATION: 753860
MDC IDC MSMT BATTERY VOLTAGE: 2.89 V
MDC IDC MSMT LEADCHNL RV IMPEDANCE VALUE: 410 Ohm
MDC IDC MSMT LEADCHNL RV PACING THRESHOLD PULSEWIDTH: 0.4 ms
MDC IDC MSMT LEADCHNL RV SENSING INTR AMPL: 10.4 mV
MDC IDC SET LEADCHNL RV PACING PULSEWIDTH: 0.4 ms
Pulse Gen Model: 2210
Pulse Gen Serial Number: 2323973

## 2016-07-05 ENCOUNTER — Ambulatory Visit (INDEPENDENT_AMBULATORY_CARE_PROVIDER_SITE_OTHER): Payer: Medicare HMO | Admitting: Orthopedic Surgery

## 2016-07-05 DIAGNOSIS — M79672 Pain in left foot: Secondary | ICD-10-CM

## 2016-07-05 NOTE — Progress Notes (Signed)
Patient ID: [name], female   DOB: [birth year], 83 y.o.   MRN: [id number]  Chief Complaint  Patient presents with  . Follow-up    left foot pain, fx 04/29/16    HPI [name] is a 83 y.o. female.   HPI  83 year old female recently released for left foot fracture. She complains of two-week history of pain and swelling erythema in the left foot involving her toes the fracture was in the midfoot  She missed her podiatry appointment developed an ingrown toenail which was treated at home by her daughter who is a nurse  Review of Systems Review of Systems  Constitutional: Negative for fever.    Physical Exam  Difficult gait favoring the left foot inspection and palpation revealed tenderness and swelling and redness over the dorsum of the metatarsophalangeal joints and distally towards the lesser digits painful range of motion all 5 digits all 5 digits stable no atrophy skin is erythematous pulses are good deformity of all nails are noted   MEDICAL DECISION MAKING  DATA     DIAGNOSIS  Foot pain unexplained possible gout possible ingrown nail extension of nail infection  PLAN(RISK)   Protect the foot in a shoe, see the podiatrist follow-up 3 weeks

## 2016-07-06 ENCOUNTER — Ambulatory Visit: Payer: Medicare HMO | Admitting: Family Medicine

## 2016-07-06 DIAGNOSIS — S92302G Fracture of unspecified metatarsal bone(s), left foot, subsequent encounter for fracture with delayed healing: Secondary | ICD-10-CM | POA: Diagnosis not present

## 2016-07-06 DIAGNOSIS — I70293 Other atherosclerosis of native arteries of extremities, bilateral legs: Secondary | ICD-10-CM | POA: Diagnosis not present

## 2016-07-06 DIAGNOSIS — L84 Corns and callosities: Secondary | ICD-10-CM | POA: Diagnosis not present

## 2016-07-06 DIAGNOSIS — L602 Onychogryphosis: Secondary | ICD-10-CM | POA: Diagnosis not present

## 2016-07-08 ENCOUNTER — Encounter: Payer: Self-pay | Admitting: Family Medicine

## 2016-07-08 ENCOUNTER — Ambulatory Visit (INDEPENDENT_AMBULATORY_CARE_PROVIDER_SITE_OTHER): Payer: Medicare HMO | Admitting: Family Medicine

## 2016-07-08 VITALS — BP 130/72 | Wt 194.8 lb

## 2016-07-08 DIAGNOSIS — I10 Essential (primary) hypertension: Secondary | ICD-10-CM | POA: Diagnosis not present

## 2016-07-08 DIAGNOSIS — E118 Type 2 diabetes mellitus with unspecified complications: Secondary | ICD-10-CM | POA: Diagnosis not present

## 2016-07-08 DIAGNOSIS — I4891 Unspecified atrial fibrillation: Secondary | ICD-10-CM | POA: Diagnosis not present

## 2016-07-08 DIAGNOSIS — M1991 Primary osteoarthritis, unspecified site: Secondary | ICD-10-CM

## 2016-07-08 LAB — POCT GLYCOSYLATED HEMOGLOBIN (HGB A1C): HEMOGLOBIN A1C: 6.7

## 2016-07-08 NOTE — Patient Instructions (Signed)
Diabetes Mellitus and Food It is important for you to manage your blood sugar (glucose) level. Your blood glucose level can be greatly affected by what you eat. Eating healthier foods in the appropriate amounts throughout the day at about the same time each day will help you control your blood glucose level. It can also help slow or prevent worsening of your diabetes mellitus. Healthy eating may even help you improve the level of your blood pressure and reach or maintain a healthy weight. General recommendations for healthful eating and cooking habits include:  Eating meals and snacks regularly. Avoid going long periods of time without eating to lose weight.  Eating a diet that consists mainly of plant-based foods, such as fruits, vegetables, nuts, legumes, and whole grains.  Using low-heat cooking methods, such as baking, instead of high-heat cooking methods, such as deep frying.  Work with your dietitian to make sure you understand how to use the Nutrition Facts information on food labels. How can food affect me? Carbohydrates Carbohydrates affect your blood glucose level more than any other type of food. Your dietitian will help you determine how many carbohydrates to eat at each meal and teach you how to count carbohydrates. Counting carbohydrates is important to keep your blood glucose at a healthy level, especially if you are using insulin or taking certain medicines for diabetes mellitus. Alcohol Alcohol can cause sudden decreases in blood glucose (hypoglycemia), especially if you use insulin or take certain medicines for diabetes mellitus. Hypoglycemia can be a life-threatening condition. Symptoms of hypoglycemia (sleepiness, dizziness, and disorientation) are similar to symptoms of having too much alcohol. If your health care provider has given you approval to drink alcohol, do so in moderation and use the following guidelines:  Women should not have more than one drink per day, and men  should not have more than two drinks per day. One drink is equal to: ? 12 oz of beer. ? 5 oz of wine. ? 1 oz of hard liquor.  Do not drink on an empty stomach.  Keep yourself hydrated. Have water, diet soda, or unsweetened iced tea.  Regular soda, juice, and other mixers might contain a lot of carbohydrates and should be counted.  What foods are not recommended? As you make food choices, it is important to remember that all foods are not the same. Some foods have fewer nutrients per serving than other foods, even though they might have the same number of calories or carbohydrates. It is difficult to get your body what it needs when you eat foods with fewer nutrients. Examples of foods that you should avoid that are high in calories and carbohydrates but low in nutrients include:  Trans fats (most processed foods list trans fats on the Nutrition Facts label).  Regular soda.  Juice.  Candy.  Sweets, such as cake, pie, doughnuts, and cookies.  Fried foods.  What foods can I eat? Eat nutrient-rich foods, which will nourish your body and keep you healthy. The food you should eat also will depend on several factors, including:  The calories you need.  The medicines you take.  Your weight.  Your blood glucose level.  Your blood pressure level.  Your cholesterol level.  You should eat a variety of foods, including:  Protein. ? Lean cuts of meat. ? Proteins low in saturated fats, such as fish, egg whites, and beans. Avoid processed meats.  Fruits and vegetables. ? Fruits and vegetables that may help control blood glucose levels, such as apples,   mangoes, and yams.  Dairy products. ? Choose fat-free or low-fat dairy products, such as milk, yogurt, and cheese.  Grains, bread, pasta, and rice. ? Choose whole grain products, such as multigrain bread, whole oats, and brown rice. These foods may help control blood pressure.  Fats. ? Foods containing healthful fats, such as  nuts, avocado, olive oil, canola oil, and fish.  Does everyone with diabetes mellitus have the same meal plan? Because every person with diabetes mellitus is different, there is not one meal plan that works for everyone. It is very important that you meet with a dietitian who will help you create a meal plan that is just right for you. This information is not intended to replace advice given to you by your health care provider. Make sure you discuss any questions you have with your health care provider. Document Released: 02/18/2005 Document Revised: 10/30/2015 Document Reviewed: 04/20/2013 Elsevier Interactive Patient Education  2017 [employer] Inc[employer]  

## 2016-07-08 NOTE — Progress Notes (Addendum)
   Subjective:    Patient ID: [name], female    DOB: 1927-04-22, 83 y.o.   MRN: [id number]  HPI  Patient presents to office today c/o painful swelling of left foot.  She states she fell on Thanksgiving and injured foot.  She c/o pain and swelling worsening. She states she saw Dr. Aline Brochure in regards to injury and had 2Vs.  Face-to-face evaluation was done today in the purpose of dealing with her physical disabilities that requires special help in the household regarding renovations  Her diabetes is been stable she tries to watch her diet. She does admit to taking in too much starches including breads and potatoes. She is not on any medication. We are trying to control this by diet alone Patient with heart disease and hypertension and atrial fibrillation on her Coumadin on her blood pressure medicine denies any shortness of breath recently Patient does have history gout but has not had any flareups recently. Review of Systems Patient denies any chest tightness pressure pain shortness breath patient denies nausea vomiting diarrhea fever chills    Objective:   Physical Exam Lungs are clear no crackle heart irregular but rate is controlled Abdomen soft Skin warm dry neurologic grossly normal Tenderness midfoot on left foot but she is wearing a brace that the patient is unable to squat   patient with significant osteoarthritis of knees and ankles hands she manages his best she can walks with a cane unsteady gait      Assessment & Plan:  diabetes diet control watch diet closely continue to be as active as possible  Physical disabilities due to accumulation of health issues including arthritis patient unable to do simple walking without the risk of falling she would benefit from renovation of her household facilities In order to lessen the risk of falls. Also she is unable to get in and out of the bathtub-she would benefit from renovation of her shower facilities as well as bathroom  facilities to prevent falls. She has severe osteoarthritis as well as ataxia related to her condition and weakness. She has multiple limitations. She also has difficult time reaching sink as well as sitting on a standard toilet seat she needs adjustment in the heights of these.  Blood pressure recheck the blood pressure is good continue current measures  Diabetes A1c stable recheck it again in 4 months   25 minutes was spent with the patient. Greater than half the time was spent in discussion and answering questions and counseling regarding the issues that the patient came in for today.

## 2016-07-12 ENCOUNTER — Other Ambulatory Visit: Payer: Self-pay | Admitting: Family Medicine

## 2016-07-12 NOTE — Telephone Encounter (Signed)
May have this plus number four additional refills

## 2016-07-19 ENCOUNTER — Ambulatory Visit (INDEPENDENT_AMBULATORY_CARE_PROVIDER_SITE_OTHER): Payer: Medicare HMO | Admitting: *Deleted

## 2016-07-19 DIAGNOSIS — I639 Cerebral infarction, unspecified: Secondary | ICD-10-CM

## 2016-07-19 DIAGNOSIS — I495 Sick sinus syndrome: Secondary | ICD-10-CM | POA: Diagnosis not present

## 2016-07-19 DIAGNOSIS — Z5181 Encounter for therapeutic drug level monitoring: Secondary | ICD-10-CM

## 2016-07-19 LAB — POCT INR: INR: 2.8

## 2016-07-20 ENCOUNTER — Other Ambulatory Visit: Payer: Self-pay | Admitting: Family Medicine

## 2016-07-26 ENCOUNTER — Ambulatory Visit (INDEPENDENT_AMBULATORY_CARE_PROVIDER_SITE_OTHER): Payer: Medicare HMO | Admitting: Orthopedic Surgery

## 2016-07-26 DIAGNOSIS — M79672 Pain in left foot: Secondary | ICD-10-CM

## 2016-07-26 NOTE — Progress Notes (Signed)
Chief Complaint  Patient presents with  . Follow-up    Recheck on left foot, DOI 04-29-16.    Recheck left foot pain swelling resolved  No tenderness in the foot  She does have a severe chronic has planus and planovalgus  Recommend regular shoes follow-up prn

## 2016-07-26 NOTE — Patient Instructions (Signed)
Regular shoes

## 2016-08-05 DIAGNOSIS — I70293 Other atherosclerosis of native arteries of extremities, bilateral legs: Secondary | ICD-10-CM | POA: Diagnosis not present

## 2016-08-05 DIAGNOSIS — S92302G Fracture of unspecified metatarsal bone(s), left foot, subsequent encounter for fracture with delayed healing: Secondary | ICD-10-CM | POA: Diagnosis not present

## 2016-08-23 ENCOUNTER — Ambulatory Visit (INDEPENDENT_AMBULATORY_CARE_PROVIDER_SITE_OTHER): Payer: Medicare HMO | Admitting: *Deleted

## 2016-08-23 DIAGNOSIS — I495 Sick sinus syndrome: Secondary | ICD-10-CM

## 2016-08-23 DIAGNOSIS — I639 Cerebral infarction, unspecified: Secondary | ICD-10-CM

## 2016-08-23 DIAGNOSIS — Z5181 Encounter for therapeutic drug level monitoring: Secondary | ICD-10-CM

## 2016-08-23 LAB — POCT INR: INR: 3.5

## 2016-09-01 DIAGNOSIS — I70293 Other atherosclerosis of native arteries of extremities, bilateral legs: Secondary | ICD-10-CM | POA: Diagnosis not present

## 2016-09-01 DIAGNOSIS — S92302G Fracture of unspecified metatarsal bone(s), left foot, subsequent encounter for fracture with delayed healing: Secondary | ICD-10-CM | POA: Diagnosis not present

## 2016-09-13 ENCOUNTER — Ambulatory Visit (INDEPENDENT_AMBULATORY_CARE_PROVIDER_SITE_OTHER): Payer: Medicare HMO | Admitting: *Deleted

## 2016-09-13 DIAGNOSIS — I639 Cerebral infarction, unspecified: Secondary | ICD-10-CM

## 2016-09-13 DIAGNOSIS — I495 Sick sinus syndrome: Secondary | ICD-10-CM | POA: Diagnosis not present

## 2016-09-13 DIAGNOSIS — Z5181 Encounter for therapeutic drug level monitoring: Secondary | ICD-10-CM

## 2016-09-13 LAB — POCT INR: INR: 3.2

## 2016-09-23 DIAGNOSIS — I70293 Other atherosclerosis of native arteries of extremities, bilateral legs: Secondary | ICD-10-CM | POA: Diagnosis not present

## 2016-09-23 DIAGNOSIS — S92302G Fracture of unspecified metatarsal bone(s), left foot, subsequent encounter for fracture with delayed healing: Secondary | ICD-10-CM | POA: Diagnosis not present

## 2016-09-24 ENCOUNTER — Ambulatory Visit: Payer: Medicare HMO | Admitting: Family Medicine

## 2016-10-05 ENCOUNTER — Other Ambulatory Visit: Payer: Self-pay | Admitting: Family Medicine

## 2016-10-05 DIAGNOSIS — H401111 Primary open-angle glaucoma, right eye, mild stage: Secondary | ICD-10-CM | POA: Diagnosis not present

## 2016-10-11 ENCOUNTER — Encounter: Payer: Self-pay | Admitting: Internal Medicine

## 2016-10-11 ENCOUNTER — Ambulatory Visit (INDEPENDENT_AMBULATORY_CARE_PROVIDER_SITE_OTHER): Payer: Medicare HMO | Admitting: Internal Medicine

## 2016-10-11 ENCOUNTER — Ambulatory Visit (INDEPENDENT_AMBULATORY_CARE_PROVIDER_SITE_OTHER): Payer: Medicare HMO | Admitting: *Deleted

## 2016-10-11 VITALS — BP 144/80 | HR 86 | Ht 65.0 in | Wt 199.0 lb

## 2016-10-11 DIAGNOSIS — I639 Cerebral infarction, unspecified: Secondary | ICD-10-CM | POA: Diagnosis not present

## 2016-10-11 DIAGNOSIS — Z5181 Encounter for therapeutic drug level monitoring: Secondary | ICD-10-CM

## 2016-10-11 DIAGNOSIS — I495 Sick sinus syndrome: Secondary | ICD-10-CM | POA: Diagnosis not present

## 2016-10-11 DIAGNOSIS — I4891 Unspecified atrial fibrillation: Secondary | ICD-10-CM | POA: Diagnosis not present

## 2016-10-11 LAB — CUP PACEART INCLINIC DEVICE CHECK
Brady Statistic RA Percent Paced: 0 %
Implantable Lead Location: 753860
Lead Channel Pacing Threshold Amplitude: 0.5 V
Lead Channel Pacing Threshold Amplitude: 0.5 V
Lead Channel Pacing Threshold Amplitude: 1.125 V
Lead Channel Pacing Threshold Pulse Width: 0.4 ms
Lead Channel Pacing Threshold Pulse Width: 0.4 ms
Lead Channel Setting Pacing Amplitude: 2.5 V
Lead Channel Setting Pacing Pulse Width: 0.4 ms
Lead Channel Setting Sensing Sensitivity: 2 mV
MDC IDC LEAD IMPLANT DT: 20100602
MDC IDC LEAD IMPLANT DT: 20100602
MDC IDC LEAD LOCATION: 753859
MDC IDC MSMT BATTERY VOLTAGE: 2.89 V
MDC IDC MSMT LEADCHNL RA PACING THRESHOLD PULSEWIDTH: 0.4 ms
MDC IDC MSMT LEADCHNL RA SENSING INTR AMPL: 5 mV
MDC IDC MSMT LEADCHNL RV IMPEDANCE VALUE: 462.5 Ohm
MDC IDC MSMT LEADCHNL RV SENSING INTR AMPL: 12 mV
MDC IDC PG IMPLANT DT: 20100602
MDC IDC PG SERIAL: 2323973
MDC IDC SESS DTM: 20180507115246
MDC IDC STAT BRADY RV PERCENT PACED: 65 %

## 2016-10-11 LAB — POCT INR: INR: 2

## 2016-10-11 NOTE — Progress Notes (Signed)
HPI  [name] returns today for followup. She is a very pleasant 83 year old woman with a history of now chronic atrial fibrillation, symptomatic bradycardia, and remote strokes.  In the interim, she has been stable. She denies peripheral edema, cough, or chest pain. No syncope. She has dyspnea with exertion. She remains active cooking and cleaning her house. 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 No Known Allergies   Current Outpatient Prescriptions  Medication Sig Dispense Refill  . acetaminophen (TYLENOL) 325 MG tablet Take 325 mg by mouth every 6 (six) hours as needed for moderate pain.    Marland Kitchen allopurinol (ZYLOPRIM) 100 MG tablet TAKE ONE TABLET DAILY TO PREVENT GOUT. (Patient taking differently: Take 100 mg by mouth daily. TO PREVENT GOUT.) 30 tablet 5  . diltiazem (TIAZAC) 360 MG 24 hr capsule TAKE ONE CAPSULE BY MOUTH ONCE DAILY. 30 capsule 11  . dorzolamide-timolol (COSOPT) 22.3-6.8 MG/ML ophthalmic solution Place 1 drop into both eyes at bedtime.     . furosemide (LASIX) 20 MG tablet TAKE ONE TABLET BY MOUTH ONCE DAILY. 30 tablet 5  . lisinopril (PRINIVIL,ZESTRIL) 20 MG tablet TAKE ONE TABLET BY MOUTH ONCE DAILY. 90 tablet 3  . meclizine (ANTIVERT) 25 MG tablet Take 25 mg by mouth 3 (three) times daily as needed. Dizziness    . pravastatin (PRAVACHOL) 80 MG tablet TAKE (1) TABLET BY MOUTH AT BEDTIME. 30 tablet 0  . ranitidine (ZANTAC) 150 MG tablet TAKE (1) TABLET BY MOUTH TWICE DAILY. 60 tablet 5  . VENTOLIN HFA 108 (90 Base) MCG/ACT inhaler TAKE 2 PUFFS EVERY 6 HOURS AS NEEDED 18 g 2  . Vitamin D, Ergocalciferol, (DRISDOL) 50000 units CAPS capsule Take 1 capsule (50,000 Units total) by mouth every 7 (seven) days. 4 capsule 1  . warfarin (COUMADIN) 5 MG tablet TAKE 1/2 TO 1 TABLET BY MOUTH DAILY OR AS DIRECTED. 30 tablet 3   No current facility-administered medications for this visit.      Past Medical History:  Diagnosis Date   . Anemia    H/H of 11.3/36.7 in 12/2008 ;normal MCV; normal CBC in 2011  . Angioedema 08/2006  . Atrial fibrillation (HCC)    persistant  . Blood transfusion   . 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 Chronic bronchitis   . Chronic renal insufficiency    baseline creatine 1.4; 1.04 in 03/2010  . DJD (degenerative joint disease)    hands, knees  . Gout   . Hyperlipidemia   . Hypertension    heart disease diastolic dysfunction ; EN-27%; mild pulmonary edema in 2006;responded to diurectics ; LVH  . Kidney stones   . Mitral valve disease    Severe mitral annular calcification; mild to moderate stenosis-not clearly rheumatic  . Nephrolithiasis   . Pacemaker   . Retinal hemorrhage   . Seizure disorder ([name])   . Shortness of breath    only with exertion  . Stroke ([name])   . Tachycardia-bradycardia syndrome ([name])    with pauses and syncope;PPM implantation 10/2008,negative stress nuclear study 03/2005  . Vertigo     ROS:   All systems reviewed and negative except as noted in the HPI.   Past Surgical History:  Procedure Laterality Date  . CATARACT EXTRACTION W/PHACO  03/29/2011   Procedure: CATARACT EXTRACTION PHACO AND INTRAOCULAR LENS PLACEMENT (IOC);  Surgeon: Tonny Branch;  Location: [facility];  Service: Ophthalmology;  Laterality: Right;  CDE 12.62  . INSERT / REPLACE / REMOVE PACEMAKER  11/2008   St.Jude DUAL chamber pacemaker 11/06/2008  . MEMBRANE PEEL Right 02/12/2014   Procedure: MEMBRANE PEEL;  Surgeon: Meghan Pedro, MD;  Location: [facility];  Service: Ophthalmology;  Laterality: Right;  . PARS PLANA VITRECTOMY Right 02/12/2014   Procedure: PARS PLANA VITRECTOMY WITH 25 GAUGE;  Surgeon: Meghan Pedro, MD;  Location: [facility];  Service: Ophthalmology;  Laterality: Right;  .  TOTAL KNEE ARTHROPLASTY  04/2009   Right     Family History  Problem Relation Age of Onset  . Cancer Other     unknown cancer  . Hypotension Neg Hx   . Anesthesia problems Neg Hx   . Pseudochol deficiency Neg Hx   . Malignant hyperthermia Neg Hx      Social History   Social History  . Marital status: Married    Spouse name: N/A  . Number of children: N/A  . Years of education: N/A   Occupational History  . Not on file.   Social History Main Topics  . Smoking status: Never Smoker  . Smokeless tobacco: Never Used  . Alcohol use No  . Drug use: No  . Sexual activity: Not on file   Other Topics Concern  . Not on file   Social History Narrative  . No narrative on file     BP (!) 144/80   Pulse 86   Ht 5\' 5"  (1.651 m)   Wt 199 lb (90.3 kg)   SpO2 94%   BMI 33.12 kg/m   Physical Exam:  Well appearing 83 year old woman, obese,NAD HEENT: Unremarkable Neck:  6 cm JVD, no thyromegally Lungs:  Clear with no wheezes, rales, or rhonchi. HEART:  IRegular rate rhythm, no murmurs, no rubs, no clicks Abd:  soft, positive bowel sounds, no organomegally, no rebound, no guarding Ext:  2 plus pulses, no edema, no cyanosis, no clubbing Skin:  No rashes no nodules Neuro:  CN II through XII intact, motor grossly intact except for very minimal right HP.  ECG - atrial fib with a controlled VR  DEVICE  Normal device function.  See PaceArt for details.   Assess/Plan: 1. Atrial fib - her ventricular rate is well controlled. Will follow. 2. HTN - her blood pressure is better. She is encouraged to reduce her salt intake. No change in her BP meds today. 3. PPM - her St. Jude DDD PM is working normally. She has 7 years on her battery. 4. Obesity - I have encouraged the patient to lose weight.   Mikle Bosworth.D.

## 2016-10-11 NOTE — Patient Instructions (Signed)
Your physician wants you to follow-up in: 1 year Dr Knox Saliva will receive a reminder letter in the mail two months in advance. If you don't receive a letter, please call our office to schedule the follow-up appointment.    Your physician recommends that you continue on your current medications as directed. Please refer to the Current Medication list given to you today.   Remote monitoring is used to monitor your Pacemaker of ICD from home. This monitoring reduces the number of office visits required to check your device to one time per year. It allows [place] to keep an eye on the functioning of your device to ensure it is working properly. You are scheduled for a device check from home on 01/10/17. You may send your transmission at any time that day. If you have a wireless device, the transmission will be sent automatically. After your physician reviews your transmission, you will receive a postcard with your next transmission date.      If you need a refill on your cardiac medications before your next appointment, please call your pharmacy.       Thank you for choosing [facility] !

## 2016-10-13 ENCOUNTER — Other Ambulatory Visit: Payer: Self-pay | Admitting: Internal Medicine

## 2016-10-13 ENCOUNTER — Other Ambulatory Visit: Payer: Self-pay | Admitting: Family Medicine

## 2016-10-25 ENCOUNTER — Other Ambulatory Visit: Payer: Self-pay | Admitting: Family Medicine

## 2016-10-25 ENCOUNTER — Other Ambulatory Visit: Payer: Self-pay | Admitting: Internal Medicine

## 2016-11-04 ENCOUNTER — Ambulatory Visit: Payer: Medicare HMO | Admitting: Family Medicine

## 2016-11-04 DIAGNOSIS — S92353K Displaced fracture of fifth metatarsal bone, unspecified foot, subsequent encounter for fracture with nonunion: Secondary | ICD-10-CM | POA: Diagnosis not present

## 2016-11-04 DIAGNOSIS — R609 Edema, unspecified: Secondary | ICD-10-CM | POA: Diagnosis not present

## 2016-11-04 DIAGNOSIS — M79672 Pain in left foot: Secondary | ICD-10-CM | POA: Diagnosis not present

## 2016-11-05 ENCOUNTER — Ambulatory Visit: Payer: Medicare HMO | Admitting: Family Medicine

## 2016-11-08 ENCOUNTER — Ambulatory Visit (INDEPENDENT_AMBULATORY_CARE_PROVIDER_SITE_OTHER): Payer: Medicare HMO | Admitting: *Deleted

## 2016-11-08 DIAGNOSIS — I639 Cerebral infarction, unspecified: Secondary | ICD-10-CM

## 2016-11-08 DIAGNOSIS — Z5181 Encounter for therapeutic drug level monitoring: Secondary | ICD-10-CM | POA: Diagnosis not present

## 2016-11-08 DIAGNOSIS — I495 Sick sinus syndrome: Secondary | ICD-10-CM | POA: Diagnosis not present

## 2016-11-08 LAB — POCT INR: INR: 2.4

## 2016-11-15 ENCOUNTER — Other Ambulatory Visit: Payer: Self-pay | Admitting: Family Medicine

## 2016-11-15 ENCOUNTER — Other Ambulatory Visit: Payer: Self-pay | Admitting: Internal Medicine

## 2016-11-17 DIAGNOSIS — S92353K Displaced fracture of fifth metatarsal bone, unspecified foot, subsequent encounter for fracture with nonunion: Secondary | ICD-10-CM | POA: Diagnosis not present

## 2016-11-18 ENCOUNTER — Ambulatory Visit (INDEPENDENT_AMBULATORY_CARE_PROVIDER_SITE_OTHER): Payer: Medicare HMO | Admitting: Family Medicine

## 2016-11-18 ENCOUNTER — Encounter: Payer: Self-pay | Admitting: Family Medicine

## 2016-11-18 VITALS — BP 134/82 | Ht 62.0 in | Wt 197.0 lb

## 2016-11-18 DIAGNOSIS — Z79899 Other long term (current) drug therapy: Secondary | ICD-10-CM

## 2016-11-18 DIAGNOSIS — E7849 Other hyperlipidemia: Secondary | ICD-10-CM

## 2016-11-18 DIAGNOSIS — Z7901 Long term (current) use of anticoagulants: Secondary | ICD-10-CM

## 2016-11-18 DIAGNOSIS — M1000 Idiopathic gout, unspecified site: Secondary | ICD-10-CM

## 2016-11-18 DIAGNOSIS — E784 Other hyperlipidemia: Secondary | ICD-10-CM

## 2016-11-18 DIAGNOSIS — E118 Type 2 diabetes mellitus with unspecified complications: Secondary | ICD-10-CM

## 2016-11-18 DIAGNOSIS — I4891 Unspecified atrial fibrillation: Secondary | ICD-10-CM

## 2016-11-18 DIAGNOSIS — Z23 Encounter for immunization: Secondary | ICD-10-CM

## 2016-11-18 DIAGNOSIS — I10 Essential (primary) hypertension: Secondary | ICD-10-CM

## 2016-11-18 LAB — POCT GLYCOSYLATED HEMOGLOBIN (HGB A1C): Hemoglobin A1C: 6.5

## 2016-11-18 NOTE — Progress Notes (Signed)
   Subjective:    Patient ID: [name], female    DOB: Apr 22, 1927, 83 y.o.   MRN: [id number]  Diabetes  She presents for her follow-up diabetic visit. She has type 2 diabetes mellitus. Pertinent negatives for hypoglycemia include no confusion. Pertinent negatives for diabetes include no chest pain, no fatigue, no polydipsia, no polyphagia and no weakness. She sees a podiatrist.Eye exam is current.  She does have significant bilateral foot pain worse on the left side where she had a fracture she does see a podiatrist on a regular basis Pain in lower back,says it is better since stopped drinking soda.Patient denies radiation down the legs. Pt complains of dizziness while lying down. She does state she feels dizzy when she lays down on her back with her head tilted backwards she does not get that way when she lays on her side she denies any unilateral numbness or weakness denies headaches vomiting or double vision Results for orders placed or performed in visit on 11/18/16  POCT glycosylated hemoglobin (Hb A1C)  Result Value Ref Range   Hemoglobin A1C 6.5    Review of Systems  Constitutional: Negative for activity change, appetite change and fatigue.  HENT: Negative for congestion.   Respiratory: Negative for cough.   Cardiovascular: Negative for chest pain.  Gastrointestinal: Negative for abdominal pain.  Endocrine: Negative for polydipsia and polyphagia.  Neurological: Negative for weakness.  Psychiatric/Behavioral: Negative for confusion.   She does have a history of atrial fibrillation takes her medication takes anticoagulant denies any bleeding issues. Denies any rectal bleeding she also takes her medication to prevent acid reflux-in addition this takes blood pressure medicine help her blood pressure and also keep her heart rate under good control and finally she takes her cholesterol medicine and tolerates it well     Objective:   Physical Exam  Constitutional: She appears  well-nourished. No distress.  Cardiovascular: Normal rate, regular rhythm and normal heart sounds.   No murmur heard. Pulmonary/Chest: Effort normal and breath sounds normal. No respiratory distress.  Musculoskeletal: She exhibits no edema.  Lymphadenopathy:    She has no cervical adenopathy.  Neurological: She is alert. She exhibits normal muscle tone.  Psychiatric: Her behavior is normal.  Vitals reviewed.   25 minutes was spent with the patient. Greater than half the time was spent in discussion and answering questions and counseling regarding the issues that the patient came in for today.       Assessment & Plan:  Diabetes overall good control. No low sugar spells. Continue current measures  Blood pressure good control no changes watch diet  Atrial fibrillation without any problems rate is controlledfailure  With history of anticoagulation we will check CBC  Hyperlipidemia previous labs reviewed 's ordered await results  History of gout will check lab work. Continue current medication  Pneumonia vaccine given today.

## 2016-11-22 DIAGNOSIS — E784 Other hyperlipidemia: Secondary | ICD-10-CM | POA: Diagnosis not present

## 2016-11-22 DIAGNOSIS — Z79899 Other long term (current) drug therapy: Secondary | ICD-10-CM | POA: Diagnosis not present

## 2016-11-22 DIAGNOSIS — M1000 Idiopathic gout, unspecified site: Secondary | ICD-10-CM | POA: Diagnosis not present

## 2016-11-22 DIAGNOSIS — E118 Type 2 diabetes mellitus with unspecified complications: Secondary | ICD-10-CM | POA: Diagnosis not present

## 2016-11-22 DIAGNOSIS — I10 Essential (primary) hypertension: Secondary | ICD-10-CM | POA: Diagnosis not present

## 2016-11-23 ENCOUNTER — Other Ambulatory Visit: Payer: Self-pay | Admitting: *Deleted

## 2016-11-23 LAB — HEPATIC FUNCTION PANEL
ALBUMIN: 4 g/dL (ref 3.5–4.7)
ALT: 10 IU/L (ref 0–32)
AST: 15 IU/L (ref 0–40)
Alkaline Phosphatase: 70 IU/L (ref 39–117)
BILIRUBIN TOTAL: 0.2 mg/dL (ref 0.0–1.2)
Bilirubin, Direct: 0.1 mg/dL (ref 0.00–0.40)
TOTAL PROTEIN: 6.6 g/dL (ref 6.0–8.5)

## 2016-11-23 LAB — CBC WITH DIFFERENTIAL/PLATELET
BASOS ABS: 0 10*3/uL (ref 0.0–0.2)
Basos: 0 %
EOS (ABSOLUTE): 0.1 10*3/uL (ref 0.0–0.4)
Eos: 2 %
HEMOGLOBIN: 12.1 g/dL (ref 11.1–15.9)
Hematocrit: 38 % (ref 34.0–46.6)
IMMATURE GRANS (ABS): 0 10*3/uL (ref 0.0–0.1)
IMMATURE GRANULOCYTES: 0 %
LYMPHS: 41 %
Lymphocytes Absolute: 2.5 10*3/uL (ref 0.7–3.1)
MCH: 29 pg (ref 26.6–33.0)
MCHC: 31.8 g/dL (ref 31.5–35.7)
MCV: 91 fL (ref 79–97)
MONOCYTES: 10 %
Monocytes Absolute: 0.6 10*3/uL (ref 0.1–0.9)
NEUTROS ABS: 2.8 10*3/uL (ref 1.4–7.0)
NEUTROS PCT: 47 %
PLATELETS: 200 10*3/uL (ref 150–379)
RBC: 4.17 x10E6/uL (ref 3.77–5.28)
RDW: 14.1 % (ref 12.3–15.4)
WBC: 6 10*3/uL (ref 3.4–10.8)

## 2016-11-23 LAB — LIPID PANEL
CHOL/HDL RATIO: 2.2 ratio (ref 0.0–4.4)
Cholesterol, Total: 108 mg/dL (ref 100–199)
HDL: 49 mg/dL (ref >39)
LDL CALC: 40 mg/dL (ref 0–99)
Triglycerides: 97 mg/dL (ref 0–149)
VLDL CHOLESTEROL CAL: 19 mg/dL (ref 5–40)

## 2016-11-23 LAB — URIC ACID: URIC ACID: 6.2 mg/dL (ref 2.5–7.1)

## 2016-11-23 LAB — BASIC METABOLIC PANEL
BUN/Creatinine Ratio: 17 (ref 12–28)
BUN: 22 mg/dL (ref 8–27)
CALCIUM: 10.7 mg/dL — AB (ref 8.7–10.3)
CHLORIDE: 106 mmol/L (ref 96–106)
CO2: 25 mmol/L (ref 20–29)
Creatinine, Ser: 1.31 mg/dL — ABNORMAL HIGH (ref 0.57–1.00)
GFR calc non Af Amer: 37 mL/min/{1.73_m2} — ABNORMAL LOW (ref >59)
GFR, EST AFRICAN AMERICAN: 42 mL/min/{1.73_m2} — AB (ref >59)
Glucose: 128 mg/dL — ABNORMAL HIGH (ref 65–99)
POTASSIUM: 4.8 mmol/L (ref 3.5–5.2)
Sodium: 144 mmol/L (ref 134–144)

## 2016-11-23 LAB — MICROALBUMIN / CREATININE URINE RATIO
CREATININE, UR: 108.2 mg/dL
MICROALB/CREAT RATIO: 28.1 mg/g{creat} (ref 0.0–30.0)
Microalbumin, Urine: 30.4 ug/mL

## 2016-11-24 ENCOUNTER — Other Ambulatory Visit: Payer: Self-pay | Admitting: Family Medicine

## 2016-11-25 LAB — PTH, INTACT AND CALCIUM
Calcium: 10.3 mg/dL (ref 8.7–10.3)
PTH: 56 pg/mL (ref 15–65)

## 2016-12-06 ENCOUNTER — Ambulatory Visit (INDEPENDENT_AMBULATORY_CARE_PROVIDER_SITE_OTHER): Payer: Medicare HMO | Admitting: *Deleted

## 2016-12-06 DIAGNOSIS — I639 Cerebral infarction, unspecified: Secondary | ICD-10-CM

## 2016-12-06 DIAGNOSIS — I495 Sick sinus syndrome: Secondary | ICD-10-CM

## 2016-12-06 DIAGNOSIS — Z5181 Encounter for therapeutic drug level monitoring: Secondary | ICD-10-CM

## 2016-12-06 LAB — POCT INR: INR: 5.4

## 2016-12-15 ENCOUNTER — Ambulatory Visit (INDEPENDENT_AMBULATORY_CARE_PROVIDER_SITE_OTHER): Payer: Medicare HMO | Admitting: *Deleted

## 2016-12-15 DIAGNOSIS — I639 Cerebral infarction, unspecified: Secondary | ICD-10-CM

## 2016-12-15 DIAGNOSIS — Z5181 Encounter for therapeutic drug level monitoring: Secondary | ICD-10-CM | POA: Diagnosis not present

## 2016-12-15 DIAGNOSIS — I495 Sick sinus syndrome: Secondary | ICD-10-CM | POA: Diagnosis not present

## 2016-12-15 LAB — POCT INR: INR: 2

## 2016-12-22 DIAGNOSIS — S92353K Displaced fracture of fifth metatarsal bone, unspecified foot, subsequent encounter for fracture with nonunion: Secondary | ICD-10-CM | POA: Diagnosis not present

## 2016-12-22 DIAGNOSIS — L84 Corns and callosities: Secondary | ICD-10-CM | POA: Diagnosis not present

## 2016-12-22 DIAGNOSIS — M79672 Pain in left foot: Secondary | ICD-10-CM | POA: Diagnosis not present

## 2016-12-22 DIAGNOSIS — I70293 Other atherosclerosis of native arteries of extremities, bilateral legs: Secondary | ICD-10-CM | POA: Diagnosis not present

## 2016-12-22 DIAGNOSIS — R609 Edema, unspecified: Secondary | ICD-10-CM | POA: Diagnosis not present

## 2016-12-22 DIAGNOSIS — B351 Tinea unguium: Secondary | ICD-10-CM | POA: Diagnosis not present

## 2016-12-27 ENCOUNTER — Ambulatory Visit (INDEPENDENT_AMBULATORY_CARE_PROVIDER_SITE_OTHER): Payer: Medicare HMO | Admitting: Family Medicine

## 2016-12-27 ENCOUNTER — Encounter: Payer: Self-pay | Admitting: Family Medicine

## 2016-12-27 VITALS — BP 132/84 | Ht 62.0 in | Wt 194.4 lb

## 2016-12-27 DIAGNOSIS — S161XXA Strain of muscle, fascia and tendon at neck level, initial encounter: Secondary | ICD-10-CM

## 2016-12-27 MED ORDER — PREDNISONE 10 MG PO TABS: ORAL_TABLET | ORAL | 0 refills | Status: DC

## 2016-12-27 NOTE — Progress Notes (Signed)
   Subjective:    Patient ID: [name], female    DOB: 1926-10-20, 83 y.o.   MRN: [id number]  HPI  Patient arrives with c/o neck pain going into her head since Saturday.  Pain is sharp. Right-sided. Worse with movement of head and neck. Occasional shooting pains. Next  Recalls no injury.   No congestion drainage or cough.  Patient is on anticoagulation   Review of Systems No headache, no major weight loss or weight gain, no chest pain no back pain abdominal pain no change in bowel habits complete ROS otherwise negative     Objective:   Physical Exam  Alert vitals stable, NAD. Blood pressure good on repeat. HEENT normal. Lungs clear. Heart regular rate and rhythm. Significant pain turning head and neck right-sided sensitive in nature  Neurological exam stable    Assessment & Plan:  Impression cervical strain with radiation to head, options discussed. Will add prednisone for anti-inflammatory effect may temporarily affect sugars, local measures discussed

## 2017-01-05 ENCOUNTER — Ambulatory Visit (INDEPENDENT_AMBULATORY_CARE_PROVIDER_SITE_OTHER): Payer: Medicare HMO | Admitting: *Deleted

## 2017-01-05 DIAGNOSIS — I639 Cerebral infarction, unspecified: Secondary | ICD-10-CM | POA: Diagnosis not present

## 2017-01-05 DIAGNOSIS — Z5181 Encounter for therapeutic drug level monitoring: Secondary | ICD-10-CM | POA: Diagnosis not present

## 2017-01-05 DIAGNOSIS — I495 Sick sinus syndrome: Secondary | ICD-10-CM | POA: Diagnosis not present

## 2017-01-05 LAB — POCT INR: INR: 2

## 2017-01-07 ENCOUNTER — Other Ambulatory Visit: Payer: Self-pay | Admitting: Family Medicine

## 2017-01-10 ENCOUNTER — Ambulatory Visit (INDEPENDENT_AMBULATORY_CARE_PROVIDER_SITE_OTHER): Payer: Medicare HMO | Admitting: *Deleted

## 2017-01-10 DIAGNOSIS — I495 Sick sinus syndrome: Secondary | ICD-10-CM | POA: Diagnosis not present

## 2017-01-11 NOTE — Progress Notes (Signed)
Remote pacemaker transmission.   

## 2017-01-12 ENCOUNTER — Encounter: Payer: Self-pay | Admitting: Cardiology

## 2017-01-14 LAB — CUP PACEART REMOTE DEVICE CHECK
Battery Remaining Percentage: 51 %
Battery Voltage: 2.87 V
Brady Statistic RV Percent Paced: 66 %
Date Time Interrogation Session: 20180806060007
Implantable Lead Implant Date: 20100602
Implantable Lead Location: 753859
Implantable Lead Location: 753860
Lead Channel Sensing Intrinsic Amplitude: 12 mV
Lead Channel Setting Pacing Amplitude: 2.5 V
Lead Channel Setting Pacing Pulse Width: 0.4 ms
MDC IDC LEAD IMPLANT DT: 20100602
MDC IDC MSMT BATTERY REMAINING LONGEVITY: 64 mo
MDC IDC MSMT LEADCHNL RV IMPEDANCE VALUE: 400 Ohm
MDC IDC MSMT LEADCHNL RV PACING THRESHOLD AMPLITUDE: 0.5 V
MDC IDC MSMT LEADCHNL RV PACING THRESHOLD PULSEWIDTH: 0.4 ms
MDC IDC PG IMPLANT DT: 20100602
MDC IDC PG SERIAL: 2323973
MDC IDC SET LEADCHNL RV SENSING SENSITIVITY: 2 mV
Pulse Gen Model: 2210

## 2017-02-02 ENCOUNTER — Ambulatory Visit (INDEPENDENT_AMBULATORY_CARE_PROVIDER_SITE_OTHER): Payer: Medicare HMO | Admitting: *Deleted

## 2017-02-02 DIAGNOSIS — I639 Cerebral infarction, unspecified: Secondary | ICD-10-CM

## 2017-02-02 DIAGNOSIS — Z5181 Encounter for therapeutic drug level monitoring: Secondary | ICD-10-CM | POA: Diagnosis not present

## 2017-02-02 DIAGNOSIS — I495 Sick sinus syndrome: Secondary | ICD-10-CM | POA: Diagnosis not present

## 2017-02-02 LAB — POCT INR: INR: 2.7

## 2017-02-04 ENCOUNTER — Other Ambulatory Visit: Payer: Self-pay | Admitting: Family Medicine

## 2017-02-15 ENCOUNTER — Other Ambulatory Visit: Payer: Self-pay | Admitting: Family Medicine

## 2017-02-22 DIAGNOSIS — E785 Hyperlipidemia, unspecified: Secondary | ICD-10-CM | POA: Diagnosis not present

## 2017-02-22 DIAGNOSIS — M109 Gout, unspecified: Secondary | ICD-10-CM | POA: Diagnosis not present

## 2017-02-22 DIAGNOSIS — E669 Obesity, unspecified: Secondary | ICD-10-CM | POA: Diagnosis not present

## 2017-02-22 DIAGNOSIS — I10 Essential (primary) hypertension: Secondary | ICD-10-CM | POA: Diagnosis not present

## 2017-02-22 DIAGNOSIS — I4891 Unspecified atrial fibrillation: Secondary | ICD-10-CM | POA: Diagnosis not present

## 2017-02-22 DIAGNOSIS — K219 Gastro-esophageal reflux disease without esophagitis: Secondary | ICD-10-CM | POA: Diagnosis not present

## 2017-02-22 DIAGNOSIS — K089 Disorder of teeth and supporting structures, unspecified: Secondary | ICD-10-CM | POA: Diagnosis not present

## 2017-02-22 DIAGNOSIS — Z6837 Body mass index (BMI) 37.0-37.9, adult: Secondary | ICD-10-CM | POA: Diagnosis not present

## 2017-02-22 DIAGNOSIS — Z0000 Encounter for general adult medical examination without abnormal findings: Secondary | ICD-10-CM | POA: Diagnosis not present

## 2017-02-22 DIAGNOSIS — H6123 Impacted cerumen, bilateral: Secondary | ICD-10-CM | POA: Diagnosis not present

## 2017-02-24 ENCOUNTER — Other Ambulatory Visit: Payer: Self-pay | Admitting: Family Medicine

## 2017-02-24 NOTE — Telephone Encounter (Signed)
Last seen 11/18/16 for medication check. May we refill?

## 2017-03-02 DIAGNOSIS — I70293 Other atherosclerosis of native arteries of extremities, bilateral legs: Secondary | ICD-10-CM | POA: Diagnosis not present

## 2017-03-02 DIAGNOSIS — M79672 Pain in left foot: Secondary | ICD-10-CM | POA: Diagnosis not present

## 2017-03-02 DIAGNOSIS — B351 Tinea unguium: Secondary | ICD-10-CM | POA: Diagnosis not present

## 2017-03-02 DIAGNOSIS — R609 Edema, unspecified: Secondary | ICD-10-CM | POA: Diagnosis not present

## 2017-03-02 DIAGNOSIS — S92353K Displaced fracture of fifth metatarsal bone, unspecified foot, subsequent encounter for fracture with nonunion: Secondary | ICD-10-CM | POA: Diagnosis not present

## 2017-03-09 ENCOUNTER — Ambulatory Visit (INDEPENDENT_AMBULATORY_CARE_PROVIDER_SITE_OTHER): Payer: Medicare HMO | Admitting: *Deleted

## 2017-03-09 DIAGNOSIS — Z5181 Encounter for therapeutic drug level monitoring: Secondary | ICD-10-CM | POA: Diagnosis not present

## 2017-03-09 DIAGNOSIS — I639 Cerebral infarction, unspecified: Secondary | ICD-10-CM | POA: Diagnosis not present

## 2017-03-09 DIAGNOSIS — I4891 Unspecified atrial fibrillation: Secondary | ICD-10-CM

## 2017-03-09 DIAGNOSIS — I495 Sick sinus syndrome: Secondary | ICD-10-CM

## 2017-03-09 LAB — POCT INR: INR: 3.3

## 2017-03-15 ENCOUNTER — Encounter: Payer: Self-pay | Admitting: Family Medicine

## 2017-03-15 ENCOUNTER — Ambulatory Visit (INDEPENDENT_AMBULATORY_CARE_PROVIDER_SITE_OTHER): Payer: Medicare HMO | Admitting: Family Medicine

## 2017-03-15 ENCOUNTER — Other Ambulatory Visit (HOSPITAL_COMMUNITY)
Admission: RE | Admit: 2017-03-15 | Discharge: 2017-03-15 | Disposition: A | Discharge status: Home / Self Care | Payer: Medicare HMO | Source: Ambulatory Visit | Attending: Family Medicine | Admitting: Family Medicine

## 2017-03-15 VITALS — BP 138/70 | Ht 62.0 in | Wt 202.0 lb

## 2017-03-15 DIAGNOSIS — R0789 Other chest pain: Secondary | ICD-10-CM | POA: Diagnosis not present

## 2017-03-15 DIAGNOSIS — Z23 Encounter for immunization: Secondary | ICD-10-CM

## 2017-03-15 DIAGNOSIS — M542 Cervicalgia: Secondary | ICD-10-CM | POA: Diagnosis not present

## 2017-03-15 LAB — TROPONIN I: Troponin I: <0.03 ng/mL (ref <0.03)

## 2017-03-15 MED ORDER — PREDNISONE 10 MG PO TABS: ORAL_TABLET | ORAL | 0 refills | Status: DC

## 2017-03-15 NOTE — Progress Notes (Signed)
   Subjective:    Patient ID: [name], female    DOB: 15-Aug-1926, 83 y.o.   MRN: [id number]  Neck Pain   This is a new problem. Episode onset: 2 days. The pain is present in the midline (radiates to both shoulders). Associated symptoms include chest pain. Pertinent negatives include no fever or headaches. She has tried heat for the symptoms. The treatment provided no relief.  Patient relates pain in the back of the neck that she relates aching and soreness in the discomfort but then she stated that she started having symptoms of neck discomfort that radiated into both shoulders and into her chest that only lasted for a few seconds it made her concerned that there is a possibility of a heart attack going on but she denies any shortness of breath or wheezing with it denies any numbness or tingling down the arms and states he symptoms only lasted a few seconds PMH she does have risk factors for heart disease including age    Review of Systems  Constitutional: Negative for activity change, fatigue and fever.  HENT: Negative for congestion.   Respiratory: Negative for cough, chest tightness and shortness of breath.   Cardiovascular: Positive for chest pain. Negative for leg swelling.  Gastrointestinal: Negative for abdominal pain.  Musculoskeletal: Positive for arthralgias, back pain and neck pain.  Skin: Negative for color change.  Neurological: Negative for headaches.  Psychiatric/Behavioral: Negative for behavioral problems.       Objective:   Physical Exam  Constitutional: She appears well-developed and well-nourished. No distress.  HENT:  Head: Normocephalic and atraumatic.  Eyes: Right eye exhibits no discharge. Left eye exhibits no discharge.  Neck: No tracheal deviation present.  Cardiovascular: Normal rate, regular rhythm and normal heart sounds.   No murmur heard. Pulmonary/Chest: Effort normal and breath sounds normal. No respiratory distress. She has no wheezes. She  has no rales.  Musculoskeletal: She exhibits no edema.  Lymphadenopathy:    She has no cervical adenopathy.  Neurological: She is alert. She exhibits normal muscle tone.  Skin: Skin is warm and dry. No erythema.  Psychiatric: Her behavior is normal.  Vitals reviewed.  EKG was reviewed there appears to be new Q waves although EMS EKG showed some Q waves over year ago the indication for the EKG was chest discomfort.       Assessment & Plan:   neck spasms Neck arthralgia Short course   prednisone  Nonspecific chest pain check EKG  EKG shows loss of QRS progression shows new Q waves that wasn't present a year ago except was to some degree on EKG that EMS did back in April of last year I believe the patient should go ahead with troponin if this is negative I would recommend echo to look for wall motion abnormality

## 2017-03-16 ENCOUNTER — Encounter: Payer: Self-pay | Admitting: Family Medicine

## 2017-03-18 NOTE — Telephone Encounter (Signed)
I recommend that she reduce the dose to be 1 on Monday Wednesdays and Fridays. Please make change in Epic and send in refills notify pharmacy or the patient of the change in the instructions please

## 2017-03-18 NOTE — Telephone Encounter (Signed)
I called and left a vm,asked that she r/c. I have already sent in the new Rx for the Allopurinol to her pharmacy.

## 2017-03-31 ENCOUNTER — Emergency Department (HOSPITAL_COMMUNITY)
Admission: EM | Admit: 2017-03-31 | Discharge: 2017-03-31 | Disposition: A | Discharge status: Home / Self Care | Payer: Medicare HMO | Attending: Emergency Medicine | Admitting: Emergency Medicine

## 2017-03-31 ENCOUNTER — Encounter (HOSPITAL_COMMUNITY): Payer: Self-pay | Admitting: Emergency Medicine

## 2017-03-31 DIAGNOSIS — K645 Perianal venous thrombosis: Secondary | ICD-10-CM | POA: Diagnosis not present

## 2017-03-31 DIAGNOSIS — Z79899 Other long term (current) drug therapy: Secondary | ICD-10-CM | POA: Diagnosis not present

## 2017-03-31 DIAGNOSIS — I10 Essential (primary) hypertension: Secondary | ICD-10-CM | POA: Diagnosis not present

## 2017-03-31 DIAGNOSIS — D649 Anemia, unspecified: Secondary | ICD-10-CM | POA: Diagnosis not present

## 2017-03-31 DIAGNOSIS — Z7901 Long term (current) use of anticoagulants: Secondary | ICD-10-CM | POA: Diagnosis not present

## 2017-03-31 DIAGNOSIS — K6289 Other specified diseases of anus and rectum: Secondary | ICD-10-CM | POA: Diagnosis present

## 2017-03-31 DIAGNOSIS — E119 Type 2 diabetes mellitus without complications: Secondary | ICD-10-CM | POA: Insufficient documentation

## 2017-03-31 DIAGNOSIS — I4891 Unspecified atrial fibrillation: Secondary | ICD-10-CM | POA: Insufficient documentation

## 2017-03-31 DIAGNOSIS — Z8673 Personal history of transient ischemic attack (TIA), and cerebral infarction without residual deficits: Secondary | ICD-10-CM | POA: Insufficient documentation

## 2017-03-31 DIAGNOSIS — Z950 Presence of cardiac pacemaker: Secondary | ICD-10-CM | POA: Insufficient documentation

## 2017-03-31 MED ORDER — TRAMADOL HCL 50 MG PO TABS: 50.0000 mg | ORAL_TABLET | Freq: Four times a day (QID) | ORAL | 0 refills | Status: DC | PRN

## 2017-03-31 NOTE — Discharge Instructions (Signed)
Take the pain medication as directed.  Follow-up with your doctor.  Recommend soaking in warm water for 20 minutes each day.  Return for any new or worse symptoms.

## 2017-03-31 NOTE — ED Provider Notes (Signed)
[facility] EMERGENCY DEPARTMENT Provider Note   CSN: [id number] Arrival date & time: 03/31/17  1102     History   Chief Complaint Chief Complaint  Patient presents with  . Rectal Pain    HPI [name] is a 83 y.o. female.  Patient with complaint of some anal pain for the past 2 days.  Patient states her reminds her when she had hemorrhoids many many years ago.  No increased pain with defecation.  Some increase pain with wiping.  No blood.  No abdominal pain.  No nausea no vomiting.  Patient is on Coumadin for atrial fibrillation      Past Medical History:  Diagnosis Date  . Anemia    H/H of 11.3/36.7 in 12/2008 ;normal MCV; normal CBC in 2011  . Angioedema 08/2006  . Atrial fibrillation (HCC)    persistant  . Blood transfusion   . 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 Chronic bronchitis   . Chronic renal insufficiency    baseline creatine 1.4; 1.04 in 03/2010  . DJD (degenerative joint disease)    hands, knees  . Gout   . Hyperlipidemia   . Hypertension    heart disease diastolic dysfunction ; GY-17%; mild pulmonary edema in 2006;responded to diurectics ; LVH  . Kidney stones   . Mitral valve disease    Severe mitral annular calcification; mild to moderate stenosis-not clearly rheumatic  . Nephrolithiasis   . Pacemaker   . Retinal hemorrhage   . Seizure disorder ([place])   . Shortness of breath    only with exertion  . Stroke ([place])   . Tachycardia-bradycardia syndrome ([place])    with pauses and syncope;PPM implantation 10/2008,negative stress nuclear study 03/2005  . Vertigo     Patient Active Problem List   Diagnosis Date Noted  . Controlled type 2 diabetes mellitus with complication, without long-term current use of insulin ([place]) 07/08/2016    . Prediabetes 12/25/2015  . 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 Vitreous hemorrhage ([place]) 02/12/2014  . TIA (transient ischemic attack) 08/20/2013  . Encounter for therapeutic drug monitoring 06/28/2013  . Gout 11/14/2012  . Fasting hyperglycemia 04/06/2012  . Atrial fibrillation ([place]) 08/31/2011  . Chronic anticoagulation 12/16/2010  . 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 Cerebrovascular accident ([place])   . DJD (degenerative joint disease)   . Hypertension 02/21/2009  . Sick sinus syndrome ([place]) 02/21/2009  . Hyperlipidemia 11/26/2008  . SEIZURE DISORDER 10/31/2008    Past Surgical History:  Procedure Laterality Date  . CATARACT EXTRACTION W/PHACO  03/29/2011   Procedure: CATARACT EXTRACTION PHACO AND INTRAOCULAR LENS PLACEMENT (IOC);  Surgeon: Tonny Branch;  Location: [facility];  Service: Ophthalmology;  Laterality: Right;  CDE 12.62  . INSERT / REPLACE / REMOVE PACEMAKER  11/2008   St.Jude DUAL chamber pacemaker 11/06/2008  . MEMBRANE PEEL Right 02/12/2014   Procedure: MEMBRANE PEEL;  Surgeon: [name], MD;  Location: [place];  Service: Ophthalmology;  Laterality: Right;  . PARS PLANA VITRECTOMY Right 02/12/2014   Procedure: PARS PLANA VITRECTOMY WITH 25 GAUGE;  Surgeon: [name], MD;  Location: [place];  Service: Ophthalmology;  Laterality: Right;  . TOTAL KNEE ARTHROPLASTY  04/2009   Right    OB History    Gravida Para Term Preterm AB Living   5  5 5     5    SAB TAB Ectopic Multiple Live Births                   Home Medications    Prior to Admission medications   Medication Sig Start Date End Date Taking? Authorizing Provider  acetaminophen (TYLENOL) 325 MG tablet Take 325 mg by mouth every 6 (six) hours as needed for moderate pain.   Yes [provider]  allopurinol (ZYLOPRIM) 100 MG tablet Take one po every Monday,Wednesday,and Friday Patient taking differently: Take 100 mg by mouth 3 (three) times a week. Take one po every Monday,Wednesday,and Friday  03/18/17  Yes Mikey Kirschner, MD  diltiazem (TIAZAC) 360 MG 24 hr capsule TAKE ONE CAPSULE BY MOUTH ONCE DAILY. 11/16/16  Yes Evans Lance, MD  dorzolamide-timolol (COSOPT) 22.3-6.8 MG/ML ophthalmic solution Place 1 drop into both eyes at bedtime.    Yes [provider]  furosemide (LASIX) 20 MG tablet TAKE (1) TABLET BY MOUTH ONCE DAILY. 02/15/17  Yes Luking, Elayne Snare, MD  lisinopril (PRINIVIL,ZESTRIL) 20 MG tablet TAKE ONE TABLET BY MOUTH ONCE DAILY. 10/26/16  Yes Evans Lance, MD  meclizine (ANTIVERT) 25 MG tablet Take 25 mg by mouth 3 (three) times daily as needed. Dizziness 11/11/10  Yes Imogene Burn, PA-C  pravastatin (PRAVACHOL) 80 MG tablet TAKE (1) TABLET BY MOUTH AT BEDTIME. 02/04/17  Yes Luking, Elayne Snare, MD  ranitidine (ZANTAC) 150 MG tablet TAKE (1) TABLET BY MOUTH TWICE DAILY. 07/20/16  Yes Kathyrn Drown, MD  VENTOLIN HFA 108 (90 Base) MCG/ACT inhaler TAKE 2 PUFFS EVERY 6 HOURS AS NEEDED 07/13/16  Yes Luking, Nasario Czerniak A, MD  warfarin (COUMADIN) 5 MG tablet Take 2.5-5 mg by mouth See admin instructions. Take 2.5 mg every day except 5 mg on Thursday.   Yes [provider]  traMADol (ULTRAM) 50 MG tablet Take 1 tablet (50 mg total) by mouth every 6 (six) hours as needed. 03/31/17   Fredia Sorrow, MD  warfarin (COUMADIN) 5 MG tablet TAKE 1/2 TO 1 TABLET BY MOUTH DAILY OR AS DIRECTED. 10/13/16   Evans Lance, MD    Family History Family History  Problem Relation Age of Onset  . Cancer Other        unknown cancer  . Hypotension Neg Hx   . Anesthesia problems Neg Hx   . Pseudochol deficiency Neg Hx   . Malignant hyperthermia Neg Hx     Social History Social History  Substance Use Topics  . Smoking status: Never Smoker  . Smokeless tobacco: Never Used  . Alcohol use No     Allergies   Patient has no known allergies.   Review of Systems Review of Systems  Constitutional: Negative for fever.  HENT: Negative for congestion.   Eyes: Negative for  visual disturbance.  Respiratory: Negative for shortness of breath.   Cardiovascular: Negative for chest pain.  Gastrointestinal: Positive for rectal pain. Negative for abdominal pain, blood in stool, nausea and vomiting.  Genitourinary: Negative for dysuria.  Musculoskeletal: Negative for back pain.  Neurological: Negative for headaches.  Hematological: Bruises/bleeds easily.  Psychiatric/Behavioral: Negative for confusion.     Physical Exam Updated Vital Signs BP (!) 183/86 (BP Location: Right Arm)   Pulse 83   Temp 98.3 F (36.8 C) (Oral)   Resp 17   Ht 1.575 m (5\' 2" )   Wt 91.6 kg (202 lb)   SpO2 100%   BMI 36.95 kg/m  Physical Exam  Constitutional: She is oriented to person, place, and time. She appears well-developed and well-nourished. No distress.  HENT:  Head: Normocephalic and atraumatic.  Mouth/Throat: Oropharynx is clear and moist.  Eyes: Pupils are equal, round, and reactive to light. Conjunctivae and EOM are normal.  Neck: Neck supple.  Cardiovascular: Normal rate, regular rhythm and normal heart sounds.   Pulmonary/Chest: Effort normal and breath sounds normal. No respiratory distress.  Abdominal: Soft. Bowel sounds are normal. There is no tenderness.  Genitourinary:  Genitourinary Comments: Perianal area with evidence of a 1-2 cm external hemorrhoid which is tender suggesting thrombosis.  No visible clot.  Some old skin tags.  No prolapsed internal hemorrhoids.  No fissure.  No active bleeding.  Musculoskeletal: Normal range of motion.  Neurological: She is alert and oriented to person, place, and time. No cranial nerve deficit. She exhibits normal muscle tone. Coordination normal.  Skin: Skin is warm.  Nursing note and vitals reviewed.    ED Treatments / Results  Labs (all labs ordered are listed, but only abnormal results are displayed) Labs Reviewed - No data to display  EKG  EKG Interpretation None       Radiology No results  found.  Procedures Procedures (including critical care time)  Medications Ordered in ED Medications - No data to display   Initial Impression / Assessment and Plan / ED Course  I have reviewed the triage vital signs and the nursing notes.  Pertinent labs & imaging results that were available during my care of the patient were reviewed by me and considered in my medical decision making (see chart for details).    Findings consistent with external hemorrhoid with a thrombosis component.  Patient is already 2 days with symptoms.  She is on blood thinners.  Feel that based on the degree of discomfort that I&D is not required.  We will go and treat symptomatically with pain medicine and sits baths.  Patient is okay with this plan and prefers it.  She will follow-up with her regular doctor.  She will return for any new or worse symptoms.   Final Clinical Impressions(s) / ED Diagnoses   Final diagnoses:  External hemorrhoid, thrombosed    New Prescriptions New Prescriptions   TRAMADOL (ULTRAM) 50 MG TABLET    Take 1 tablet (50 mg total) by mouth every 6 (six) hours as needed.     Fredia Sorrow, MD 03/31/17 [phone]

## 2017-03-31 NOTE — ED Triage Notes (Signed)
Patient states she has hemorrhoids. States pain x 2 days. States no aggravating or alleviating factors.

## 2017-04-04 ENCOUNTER — Ambulatory Visit (INDEPENDENT_AMBULATORY_CARE_PROVIDER_SITE_OTHER): Payer: Medicare HMO | Admitting: Family Medicine

## 2017-04-04 ENCOUNTER — Encounter: Payer: Self-pay | Admitting: Family Medicine

## 2017-04-04 VITALS — BP 132/82 | Ht 62.0 in | Wt 198.8 lb

## 2017-04-04 DIAGNOSIS — K645 Perianal venous thrombosis: Secondary | ICD-10-CM | POA: Diagnosis not present

## 2017-04-04 NOTE — Progress Notes (Signed)
   Subjective:    Patient ID: [name], female    DOB: 11-13-1926, 83 y.o.   MRN: [id number]  HPI Patient arrives for a ER follow up for a hemorrhoid. Patient having significant little lower rectal pain denies any bleeding relates that she has a bowel movement once every several days which is normal for her and she tends to stay constipated she does not use any stool softeners she went to the ER because of pain they diagnosed a hemorrhoid put her on tramadol she relates still having infrequent bowel movements not taking a stool softener and the tramadol seems to be causing her to have significant sleepiness PMH benign Review of Systems Denies abdominal pain denies breathing difficulties denies change in appetite fever chills sweats    Objective:   Physical Exam Abdomen soft no tenderness no masses extremities no edema Thrombosed hemorrhoid noted not infected does not appear to be a growth        Assessment & Plan:  Thrombosed hemorrhoid-not causing any pain or bleeding currently I advise her to stop taking tramadol because it was causing side effects of drowsiness Encouraged patient to do good job with keeping bowel movements soft recommend half capful MiraLAX every other day  If the patient has return of severe pain with the hemorrhoid or bleeding issues we will refer to surgery  Patient will keep her regular follow-up visits

## 2017-04-06 ENCOUNTER — Ambulatory Visit (INDEPENDENT_AMBULATORY_CARE_PROVIDER_SITE_OTHER): Payer: Medicare HMO | Admitting: *Deleted

## 2017-04-06 DIAGNOSIS — I639 Cerebral infarction, unspecified: Secondary | ICD-10-CM

## 2017-04-06 DIAGNOSIS — Z5181 Encounter for therapeutic drug level monitoring: Secondary | ICD-10-CM | POA: Diagnosis not present

## 2017-04-06 DIAGNOSIS — I495 Sick sinus syndrome: Secondary | ICD-10-CM

## 2017-04-06 DIAGNOSIS — I4891 Unspecified atrial fibrillation: Secondary | ICD-10-CM | POA: Diagnosis not present

## 2017-04-06 LAB — POCT INR: INR: 2.9

## 2017-04-11 ENCOUNTER — Telehealth: Payer: Self-pay | Admitting: Family Medicine

## 2017-04-11 ENCOUNTER — Ambulatory Visit (INDEPENDENT_AMBULATORY_CARE_PROVIDER_SITE_OTHER): Payer: Medicare HMO | Admitting: *Deleted

## 2017-04-11 DIAGNOSIS — I495 Sick sinus syndrome: Secondary | ICD-10-CM | POA: Diagnosis not present

## 2017-04-11 NOTE — Progress Notes (Signed)
Remote pacemaker transmission.   

## 2017-04-11 NOTE — Telephone Encounter (Signed)
Patient states she is having pain running from her back down her leg. She is willing to come in this as per Dr.Scott request.

## 2017-04-11 NOTE — Telephone Encounter (Signed)
Patient wanted a follow-up appointment with you about her leg/hip pain.She states cant hardly use leg now.Your first available appointment is 11/26 at 8am, will patient need a sooner appointment before this date if so please call daughter [phone].

## 2017-04-13 ENCOUNTER — Encounter: Payer: Self-pay | Admitting: Cardiology

## 2017-04-14 ENCOUNTER — Ambulatory Visit (INDEPENDENT_AMBULATORY_CARE_PROVIDER_SITE_OTHER): Payer: Medicare HMO | Admitting: Family Medicine

## 2017-04-14 ENCOUNTER — Ambulatory Visit (HOSPITAL_COMMUNITY)
Admission: RE | Admit: 2017-04-14 | Discharge: 2017-04-14 | Disposition: A | Discharge status: Home / Self Care | Payer: Medicare HMO | Source: Ambulatory Visit | Attending: Family Medicine | Admitting: Family Medicine

## 2017-04-14 VITALS — BP 130/82 | Ht 62.0 in | Wt 198.0 lb

## 2017-04-14 DIAGNOSIS — M5441 Lumbago with sciatica, right side: Secondary | ICD-10-CM

## 2017-04-14 DIAGNOSIS — M5442 Lumbago with sciatica, left side: Secondary | ICD-10-CM

## 2017-04-14 DIAGNOSIS — M47816 Spondylosis without myelopathy or radiculopathy, lumbar region: Secondary | ICD-10-CM | POA: Insufficient documentation

## 2017-04-14 DIAGNOSIS — M545 Low back pain: Secondary | ICD-10-CM | POA: Diagnosis not present

## 2017-04-14 NOTE — Progress Notes (Signed)
   Subjective:    Patient ID: [name], female    DOB: [birth year], 83 y.o.   MRN: [id number]  HPI  Patient arrives with c/o back pain and leg pain that is ongoing. Patient with history of back pain goes into the leg she also has hemorrhoid that causes her some mild discomfort but no bleeding she is on anticoagulant She also has difficulty standing up and walking because of the pain in the lower back that radiates into her leg She is not a good surgical candidate Review of Systems No loss of bowel or bladder control no sweats chills fever abdominal pain no vomiting or diarrhea    Objective:   Physical Exam Lungs clear hearts regular low back subjective tenderness subjected discomfort that radiates into both by Dr. regions and thigh regions patient has difficult time getting up out of a chair difficult time walking with a walker  On a previous visit just recently she had a large hemorrhoid I do not feel this is causing her pain     Assessment & Plan:  Mild lumbar pain Significant sciatica symptoms into both legs X-rays indicated because the severity MRI not necessarily indicated currently Physical therapy to help her with balance ataxia strengthening and sciatica  Patient has hemorrhoid issue warm sitz bath's warm compresses if progressive troubles referral to surgeon  Because tramadol causes drowsiness I would recommend Tylenol for her pain

## 2017-04-15 ENCOUNTER — Encounter: Payer: Self-pay | Admitting: Family Medicine

## 2017-04-25 ENCOUNTER — Other Ambulatory Visit: Payer: Self-pay

## 2017-04-25 ENCOUNTER — Encounter (HOSPITAL_COMMUNITY): Payer: Self-pay | Admitting: Physical Therapy

## 2017-04-25 ENCOUNTER — Ambulatory Visit (HOSPITAL_COMMUNITY): Payer: Medicare HMO | Attending: Family Medicine | Admitting: Physical Therapy

## 2017-04-25 DIAGNOSIS — R262 Difficulty in walking, not elsewhere classified: Secondary | ICD-10-CM | POA: Diagnosis present

## 2017-04-25 DIAGNOSIS — R293 Abnormal posture: Secondary | ICD-10-CM | POA: Diagnosis present

## 2017-04-25 DIAGNOSIS — M5416 Radiculopathy, lumbar region: Secondary | ICD-10-CM | POA: Insufficient documentation

## 2017-04-25 NOTE — Therapy (Signed)
[place], [place], [address] Phone: [phone]   Fax:  [phone]  Physical Therapy Evaluation  Patient Details  Name: [name] MRN: [id number] Date of Birth: 25-Jun-1926 Referring Provider: Sallee Lange    Encounter Date: 04/25/2017  PT End of Session - 04/25/17 0945    Visit Number  1    Number of Visits  18    Date for PT Re-Evaluation  05/25/17    Authorization Type  [employer] medicare     Authorization - Visit Number  1    Authorization - Number of Visits  10    PT Start Time  0900    PT Stop Time  0945    PT Time Calculation (min)  45 min    Activity Tolerance  Patient tolerated treatment well    Behavior During Therapy  [facility] for tasks assessed/performed       Past Medical History:  Diagnosis Date  . Anemia    H/H of 11.3/36.7 in 12/2008 ;normal MCV; normal CBC in 2011  . Angioedema 08/2006  . Atrial fibrillation (HCC)    persistant  . Blood transfusion   . 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 Chronic bronchitis   . Chronic renal insufficiency    baseline creatine 1.4; 1.04 in 03/2010  . DJD (degenerative joint disease)    hands, knees  . Gout   . Hyperlipidemia   . Hypertension    heart disease diastolic dysfunction ; PY-19%; mild pulmonary edema in 2006;responded to diurectics ; LVH  . Kidney stones   . Mitral valve disease    Severe mitral annular calcification; mild to moderate stenosis-not clearly rheumatic  . Nephrolithiasis   . Pacemaker   . Retinal hemorrhage   . Seizure disorder ([place])   . Shortness of breath    only with exertion  . Stroke ([place])   . Tachycardia-bradycardia syndrome ([place])    with pauses and syncope;PPM implantation 10/2008,negative stress nuclear  study 03/2005  . Vertigo     Past Surgical History:  Procedure Laterality Date  . AIR/FLUID EXCHANGE Right 02/12/2014   Performed by [name], MD at [place]  . CATARACT EXTRACTION PHACO AND INTRAOCULAR LENS PLACEMENT ([place]) Right 03/29/2011   Performed by Tonny Branch, MD at [facility]  . INSERT / REPLACE / REMOVE PACEMAKER  11/2008   St.Jude DUAL chamber pacemaker 11/06/2008  . MEMBRANE PEEL Right 02/12/2014   Performed by [name], MD at [facility] OR  . PARS PLANA VITRECTOMY WITH 25 GAUGE Right 02/12/2014   Performed by [name], MD at [place]  . TOTAL KNEE ARTHROPLASTY  04/2009   Right    There were no vitals filed for this visit.   Subjective Assessment - 04/25/17 1034    Subjective  [name] states that she has had back pain for years but recently she started having pain going down her left leg to her mid thigh level  therefore her MD referred her to physical therapy.      Pertinent History  OA, Rt TKR, HTN, A fib, CVA    Limitations  Standing;Walking;House hold activities    How long can you sit comfortably?  able to sit for as long as she wants  How long can you stand comfortably?  less than five minutes     How long can you walk comfortably?  walks only short distances with her walker; (pt fell las Thanksgiving and has been on a walker ever since)     Patient Stated Goals  my back to feel better.     Pain Onset  Other (comment)         [facility] PT Assessment - 04/25/17 0001      Assessment   Medical Diagnosis  B radiculopathy     Referring Provider  Nicki Reaper Luking     Onset Date/Surgical Date  03/25/17    Next MD Visit  not scheduled     Prior Therapy  A year ago she was not using a wallker and was planting her own flowers       Precautions   Precautions  None      Restrictions   Weight Bearing Restrictions  No      Balance Screen   Has the patient fallen in the past 6 months  No    Has the patient had a decrease in activity level because of a fear of  falling?   Yes    Is the patient reluctant to leave their home because of a fear of falling?   No      Home Environment   Living Environment  Private residence    Type of [place] to enter    Entrance Stairs-Number of Steps  5    Entrance Stairs-Rails  Right      Prior Function   Level of Independence  Independent with community mobility with device    Vocation  Retired    Leisure  cooking  not cooking like she was       Charity fundraiser Status  Within Functional Limits for tasks assessed      Observation/Other Assessments   Focus on Therapeutic Outcomes (FOTO)   44      Functional Tests   Functional tests  Single leg stance;Sit to Stand      Single Leg Stance   Comments  unable at this time without UE support       Sit to Stand   Comments  5 x with UE assist 31.46      Posture/Postural Control   Posture/Postural Control  Postural limitations    Postural Limitations  Rounded Shoulders;Forward head;Decreased lumbar lordosis;Increased thoracic kyphosis      ROM / Strength   AROM / PROM / Strength  Strength      Strength   Strength Assessment Site  Knee;Ankle    Right/Left Knee  Right;Left    Right Knee Flexion  3+/5    Right Knee Extension  4-/5    Left Knee Flexion  4-/5    Left Knee Extension  5/5    Right/Left Ankle  Right;Left    Right Ankle Dorsiflexion  4/5    Left Ankle Dorsiflexion  4/5      Ambulation/Gait   Ambulation Distance (Feet)  214 Feet    Assistive device  Rolling walker    Ambulation Surface  Level    Gait velocity  slow     Gait Comments  3'  Pt walks with 20 degrees forward flexion              Objective measurements completed on examination: See above findings.      [facility] Adult  PT Treatment/Exercise - 04/25/17 0001      Exercises   Exercises  Lumbar      Lumbar Exercises: Seated   Other Seated Lumbar Exercises  scapular retraction x 10    Other Seated Lumbar Exercises  abdominal and  gluteal sets x 10; sit as tall as possible x 10              PT Education - 04/25/17 0943    Education provided  Yes    Education Details  The importance  of good posture     Person(s) Educated  Patient    Methods  Explanation;Handout    Comprehension  Verbalized understanding       PT Short Term Goals - 04/25/17 1115      PT SHORT TERM GOAL #1   Title  Pt to be walking with only 10 degree forward bend to decrease stress on pt lower back.      Time  3    Period  Weeks    Status  New    Target Date  05/16/17      PT SHORT TERM GOAL #2   Title  Pt to be able to verbalize the importance of proper sitting posture for back care     Time  3    Period  Weeks    Status  New      PT SHORT TERM GOAL #3   Title  Pt core and LE strength increased where pt states that she is not having any difficulty rolling in bed    Time  3    Period  Weeks    Status  New      PT SHORT TERM GOAL #4   Title  PT to state that the furthest down her legs her pain goes is into her hips to demonstrate decreased nerve irritation     Time  3    Period  Weeks    Status  New        PT Long Term Goals - 04/25/17 1200      PT LONG TERM GOAL #1   Title  Pt pain in low back to be no greater than a 6/10 to allow pt to ambulate 300 ft in 3 minutes to improve community ambulation     Time  6    Period  Weeks    Status  New    Target Date  06/06/17      PT LONG TERM GOAL #2   Title  Pt to be able to single leg stance on both feet for 5 seconds to reduce risk of falling     Time  6    Period  Weeks    Status  New      PT LONG TERM GOAL #3   Title  Pt to be able to walk with no forward bend at waist to reduce stress on pt lower back.     Time  6    Period  Weeks    Status  New      PT LONG TERM GOAL #4   Title  Pt to states that she has not had any radicular pain in either legs the past week     Time  6    Period  Weeks    Status  New             Plan - 04/25/17 1029    Clinical  Impression Statement  Ms. Crandell is  a 83 yo female who has had chronic back pain.  Recently she has had pain going into both of her legs which is new.  She went to her MD who has referred her to skilled physical therapy.  Evaluation demonstrates poor postrue, decreased activity tolerance, decreased balance, decreased  core and LE strength, difficulty walking and increased pain.  Ms. Huot will benefit from skilled PT to address these issues.       History and Personal Factors relevant to plan of care:  OA, Rt TKR, HTN, A fib, CVA    Clinical Presentation  Evolving    Clinical Decision Making  Low    PT Frequency  3x / week    PT Duration  6 weeks    PT Treatment/Interventions  ADLs/Self Care Home Management;Gait training;Functional mobility training;Therapeutic activities;Therapeutic exercise;Balance training;Neuromuscular re-education;Patient/family education;Manual techniques    PT Next Visit Plan  begin decompression exercises and decompression t-band exercises progress to supine stabilization.  Promote good posture.     PT Home Exercise Plan  eval:  sitting scapular retraction, glut sets, ab sets and tall sititng        Patient will benefit from skilled therapeutic intervention in order to improve the following deficits and impairments:  Abnormal gait, Decreased activity tolerance, Decreased balance, Decreased range of motion, Decreased strength, Difficulty walking, Postural dysfunction, Improper body mechanics, Pain  Visit Diagnosis: Radiculopathy, lumbar region  Difficulty in walking, not elsewhere classified  Abnormal posture  G-Codes - 05/15/2017 1028    Functional Limitation  Mobility: Walking and moving around    Mobility: Walking and Moving Around Current Status [phone])  At least 40 percent but less than 60 percent impaired, limited or restricted    Mobility: Walking and Moving Around Goal Status [phone])  At least 40 percent but less than 60 percent impaired, limited or  restricted        Problem List Patient Active Problem List   Diagnosis Date Noted  . Controlled type 2 diabetes mellitus with complication, without long-term current use of insulin ([place]) 07/08/2016  . Prediabetes 12/25/2015  . 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 Vitreous hemorrhage ([place]) 02/12/2014  . TIA (transient ischemic attack) 08/20/2013  . Encounter for therapeutic drug monitoring 06/28/2013  . Gout 11/14/2012  . Fasting hyperglycemia 04/06/2012  . Atrial fibrillation ([place]) 08/31/2011  . Chronic anticoagulation 12/16/2010  . 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 Cerebrovascular accident ([place])   . DJD (degenerative joint disease)   . Hypertension 02/21/2009  . Sick sinus syndrome ([place]) 02/21/2009  . Hyperlipidemia 11/26/2008  . SEIZURE DISORDER 10/31/2008   Rayetta Humphrey, PT CLT [phone] 2017/05/15, 12:04 PM  [place] [address] [place], [place], [address] Phone: [phone]   Fax:  [phone]  Name: [name] MRN: [id number] Date of Birth: Jun 18, 1926

## 2017-04-25 NOTE — Addendum Note (Signed)
Addended by: Leeroy Cha on: 04/25/2017 12:09 PM   Modules accepted: Orders

## 2017-04-25 NOTE — Patient Instructions (Addendum)
Isometric Abdominal    Lying on back with knees bent, tighten lower  stomach by pulling your right and left sides together you may do this sitting. . Hold _5___ seconds. Repeat _10___ times per set. Do _1___ sets per session. Do __3__ sessions per day.  http://orth.exer.us/1086   Copyright  VHI. All rights reserved.  Scapular Retraction (Standing)   May do sitting  With arms at sides, pinch shoulder blades together. Repeat _10___ times per set. Do _1___ sets per session. Do _3___ sessions per day.  http://orth.exer.us/944   Copyright  VHI. All rights reserved.  Isometric Gluteals   May do sitting in a chair Tighten buttock muscles. Repeat _10___ times per set. Do ___1_ sets per session. Do _2___ sessions per day.  http://orth.exer.us/1126   Copyright  VHI. All rights reserved.     Sit in a chair as tall as you can.  Hold for 5-10 seconds and relax.

## 2017-04-26 LAB — CUP PACEART REMOTE DEVICE CHECK
Battery Remaining Percentage: 51 %
Date Time Interrogation Session: 20181105063000
Implantable Lead Implant Date: 20100602
Implantable Lead Location: 753859
Implantable Lead Location: 753860
Implantable Pulse Generator Implant Date: 20100602
Lead Channel Impedance Value: 450 Ohm
Lead Channel Pacing Threshold Pulse Width: 0.4 ms
Lead Channel Setting Pacing Amplitude: 2.5 V
Lead Channel Setting Pacing Pulse Width: 0.4 ms
MDC IDC LEAD IMPLANT DT: 20100602
MDC IDC MSMT BATTERY REMAINING LONGEVITY: 65 mo
MDC IDC MSMT BATTERY VOLTAGE: 2.87 V
MDC IDC MSMT LEADCHNL RV PACING THRESHOLD AMPLITUDE: 0.5 V
MDC IDC MSMT LEADCHNL RV SENSING INTR AMPL: 12 mV
MDC IDC SET LEADCHNL RV SENSING SENSITIVITY: 2 mV
MDC IDC STAT BRADY RV PERCENT PACED: 66 %
Pulse Gen Model: 2210
Pulse Gen Serial Number: 2323973

## 2017-05-02 ENCOUNTER — Other Ambulatory Visit: Payer: Self-pay | Admitting: Family Medicine

## 2017-05-02 ENCOUNTER — Ambulatory Visit (HOSPITAL_COMMUNITY): Payer: Medicare HMO | Admitting: Physical Therapy

## 2017-05-02 ENCOUNTER — Ambulatory Visit: Payer: Medicare HMO | Admitting: Family Medicine

## 2017-05-02 DIAGNOSIS — R262 Difficulty in walking, not elsewhere classified: Secondary | ICD-10-CM

## 2017-05-02 DIAGNOSIS — R293 Abnormal posture: Secondary | ICD-10-CM

## 2017-05-02 DIAGNOSIS — M5416 Radiculopathy, lumbar region: Secondary | ICD-10-CM | POA: Diagnosis not present

## 2017-05-02 NOTE — Therapy (Signed)
[place], [place], [address] Phone: [phone]   Fax:  [phone]  Physical Therapy Treatment  Patient Details  Name: [name] MRN: [id number] Date of Birth: [birth year] Referring Provider: Sallee Lange    Encounter Date: 05/02/2017  PT End of Session - 05/02/17 1359    Visit Number  2    Number of Visits  18    Date for PT Re-Evaluation  05/25/17    Authorization Type  [employer] medicare     Authorization - Visit Number  2    Authorization - Number of Visits  10    PT Start Time  6734    PT Stop Time  1345    PT Time Calculation (min)  40 min    Activity Tolerance  Patient tolerated treatment well    Behavior During Therapy  [facility] for tasks assessed/performed       Past Medical History:  Diagnosis Date  . Anemia    H/H of 11.3/36.7 in 12/2008 ;normal MCV; normal CBC in 2011  . Angioedema 08/2006  . Atrial fibrillation (HCC)    persistant  . Blood transfusion   . 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 Chronic bronchitis   . Chronic renal insufficiency    baseline creatine 1.4; 1.04 in 03/2010  . DJD (degenerative joint disease)    hands, knees  . Gout   . Hyperlipidemia   . Hypertension    heart disease diastolic dysfunction ; LP-37%; mild pulmonary edema in 2006;responded to diurectics ; LVH  . Kidney stones   . Mitral valve disease    Severe mitral annular calcification; mild to moderate stenosis-not clearly rheumatic  . Nephrolithiasis   . Pacemaker   . Retinal hemorrhage   . Seizure disorder ([place])   . Shortness of breath    only with exertion  . Stroke ([place])   . Tachycardia-bradycardia syndrome ([place])    with pauses and syncope;PPM implantation 10/2008,negative stress nuclear  study 03/2005  . Vertigo     Past Surgical History:  Procedure Laterality Date  . CATARACT EXTRACTION W/PHACO  03/29/2011   Procedure: CATARACT EXTRACTION PHACO AND INTRAOCULAR LENS PLACEMENT (IOC);  Surgeon: Tonny Branch;  [facility]: [facility];  Service: Ophthalmology;  Laterality: Right;  CDE 12.62  . INSERT / REPLACE / REMOVE PACEMAKER  11/2008   St.Jude DUAL chamber pacemaker 11/06/2008  . MEMBRANE PEEL Right 02/12/2014   Procedure: MEMBRANE PEEL;  Surgeon: [name], MD;  Location: [place];  Service: Ophthalmology;  Laterality: Right;  . PARS PLANA VITRECTOMY Right 02/12/2014   Procedure: PARS PLANA VITRECTOMY WITH 25 GAUGE;  Surgeon: [name], MD;  Location: [place];  Service: Ophthalmology;  Laterality: Right;  . TOTAL KNEE ARTHROPLASTY  04/2009   Right    There were no vitals filed for this visit.  Subjective Assessment - 05/02/17 1404    Subjective  Pt states she is not hurting anywhere but in her Lt hip but was unable to give rating.   STates her husband usually rolls her when she needs to in bed.     Currently in Pain?  No/denies                      [facility] Adult PT  Treatment/Exercise - 05/02/17 0001      Ambulation/Gait   Gait Comments  50 feet X 2 working on posture      Lumbar Exercises: Seated   Other Seated Lumbar Exercises  scapular retraction x 10    Other Seated Lumbar Exercises  abdominal and gluteal sets x 10; sit as tall as possible x 10       Lumbar Exercises: Supine   Bridge  10 reps    Other Supine Lumbar Exercises  rolling each side 5X    Other Supine Lumbar Exercises  decompression 1-5             PT Education - 05/02/17 1346    Education provided  Yes    Education Details  reviewed HEP and goals per initial evaluation.  Discussed importance of completing exercises and doing things independently.  Given written instructions for decompression 1-5 exericses.      Person(s) Educated  Patient    Methods   Explanation;Handout;Demonstration;Verbal cues;Tactile cues    Comprehension  Verbalized understanding;Returned demonstration;Verbal cues required;Tactile cues required;Need further instruction       PT Short Term Goals - 04/25/17 1115      PT SHORT TERM GOAL #1   Title  Pt to be walking with only 10 degree forward bend to decrease stress on pt lower back.      Time  3    Period  Weeks    Status  New    Target Date  05/16/17      PT SHORT TERM GOAL #2   Title  Pt to be able to verbalize the importance of proper sitting posture for back care     Time  3    Period  Weeks    Status  New      PT SHORT TERM GOAL #3   Title  Pt core and LE strength increased where pt states that she is not having any difficulty rolling in bed    Time  3    Period  Weeks    Status  New      PT SHORT TERM GOAL #4   Title  PT to state that the furthest down her legs her pain goes is into her hips to demonstrate decreased nerve irritation     Time  3    Period  Weeks    Status  New        PT Long Term Goals - 04/25/17 1200      PT LONG TERM GOAL #1   Title  Pt pain in low back to be no greater than a 6/10 to allow pt to ambulate 300 ft in 3 minutes to improve community ambulation     Time  6    Period  Weeks    Status  New    Target Date  06/06/17      PT LONG TERM GOAL #2   Title  Pt to be able to single leg stance on both feet for 5 seconds to reduce risk of falling     Time  6    Period  Weeks    Status  New      PT LONG TERM GOAL #3   Title  Pt to be able to walk with no forward bend at waist to reduce stress on pt lower back.     Time  6    Period  Weeks    Status  New      PT  LONG TERM GOAL #4   Title  Pt to states that she has not had any radicular pain in either legs the past week     Time  6    Period  Weeks    Status  New            Plan - 05/02/17 1359    Clinical Impression Statement  Reviewed HEP and goals per initial evaluation.  Initiated decompression  exercises 1-5 wtih extra time spent on instruction and form.  pt with difficulty completing all reps without constant instruction as she would lose correct form of exericse.  Worked on rolling and core stability.  Pt will need further instruction for all activities.      PT Frequency  3x / week    PT Duration  6 weeks    PT Treatment/Interventions  ADLs/Self Care Home Management;Gait training;Functional mobility training;Therapeutic activities;Therapeutic exercise;Balance training;Neuromuscular re-education;Patient/family education;Manual techniques    PT Next Visit Plan  Continue with exercise instruction in correct form.  Progress to decompression t-band exercises when patient is independent with general 1-5 decompression exercises.   Continue to focus on improving posture.     PT Home Exercise Plan  eval:  sitting scapular retraction, glut sets, ab sets and tall sititng        Patient will benefit from skilled therapeutic intervention in order to improve the following deficits and impairments:  Abnormal gait, Decreased activity tolerance, Decreased balance, Decreased range of motion, Decreased strength, Difficulty walking, Postural dysfunction, Improper body mechanics, Pain  Visit Diagnosis: Radiculopathy, lumbar region  Difficulty in walking, not elsewhere classified  Abnormal posture     Problem List Patient Active Problem List   Diagnosis Date Noted  . Controlled type 2 diabetes mellitus with complication, without long-term current use of insulin ([place]) 07/08/2016  . Prediabetes 12/25/2015  . 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 Vitreous hemorrhage ([place]) 02/12/2014  . TIA (transient ischemic attack) 08/20/2013  . Encounter for therapeutic drug monitoring 06/28/2013  . Gout 11/14/2012  . Fasting hyperglycemia 04/06/2012  . Atrial fibrillation ([place]) 08/31/2011  . Chronic anticoagulation 12/16/2010  . 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 Cerebrovascular accident ([place])   . DJD  (degenerative joint disease)   . Hypertension 02/21/2009  . Sick sinus syndrome ([place]) 02/21/2009  . Hyperlipidemia 11/26/2008  . SEIZURE DISORDER 10/31/2008   Teena Irani, PTA/CLT [phone]  Teena Irani 05/02/2017, 2:06 PM  [place] [address] [place], [place], [address] Phone: [phone]   Fax:  [phone]  Name: MASON BURLEIGH MRN: [id number] Date of Birth: Mar 19, 1927

## 2017-05-04 ENCOUNTER — Ambulatory Visit (HOSPITAL_COMMUNITY): Payer: Medicare HMO

## 2017-05-04 ENCOUNTER — Encounter (HOSPITAL_COMMUNITY): Payer: Self-pay

## 2017-05-04 ENCOUNTER — Other Ambulatory Visit: Payer: Self-pay

## 2017-05-04 ENCOUNTER — Ambulatory Visit (INDEPENDENT_AMBULATORY_CARE_PROVIDER_SITE_OTHER): Payer: Medicare HMO | Admitting: *Deleted

## 2017-05-04 DIAGNOSIS — M5416 Radiculopathy, lumbar region: Secondary | ICD-10-CM | POA: Diagnosis not present

## 2017-05-04 DIAGNOSIS — I639 Cerebral infarction, unspecified: Secondary | ICD-10-CM

## 2017-05-04 DIAGNOSIS — I495 Sick sinus syndrome: Secondary | ICD-10-CM | POA: Diagnosis not present

## 2017-05-04 DIAGNOSIS — R262 Difficulty in walking, not elsewhere classified: Secondary | ICD-10-CM

## 2017-05-04 DIAGNOSIS — Z5181 Encounter for therapeutic drug level monitoring: Secondary | ICD-10-CM

## 2017-05-04 DIAGNOSIS — R293 Abnormal posture: Secondary | ICD-10-CM

## 2017-05-04 LAB — POCT INR: INR: 4.4

## 2017-05-04 NOTE — Therapy (Signed)
[place], [place], [address] Phone: [phone]   Fax:  [phone]  Physical Therapy Treatment  Patient Details  Name: [name] MRN: [id number] Date of Birth: 08/18/1926 Referring Provider: Sallee Lange    Encounter Date: 05/04/2017  PT End of Session - 05/04/17 0935    Visit Number  3    Number of Visits  18    Date for PT Re-Evaluation  05/25/17    Authorization Type  [employer] medicare     Authorization - Visit Number  3    Authorization - Number of Visits  10    PT Start Time  0940    PT Stop Time  1031    PT Time Calculation (min)  51 min    Activity Tolerance  Patient tolerated treatment well    Behavior During Therapy  [facility] for tasks assessed/performed       Past Medical History:  Diagnosis Date  . Anemia    H/H of 11.3/36.7 in 12/2008 ;normal MCV; normal CBC in 2011  . Angioedema 08/2006  . Atrial fibrillation (HCC)    persistant  . Blood transfusion   . 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 Chronic bronchitis   . Chronic renal insufficiency    baseline creatine 1.4; 1.04 in 03/2010  . DJD (degenerative joint disease)    hands, knees  . Gout   . Hyperlipidemia   . Hypertension    heart disease diastolic dysfunction ; AS-50%; mild pulmonary edema in 2006;responded to diurectics ; LVH  . Kidney stones   . Mitral valve disease    Severe mitral annular calcification; mild to moderate stenosis-not clearly rheumatic  . Nephrolithiasis   . Pacemaker   . Retinal hemorrhage   . Seizure disorder ([place])   . Shortness of breath    only with exertion  . Stroke ([place])   . Tachycardia-bradycardia syndrome ([place])    with pauses and syncope;PPM implantation 10/2008,negative stress nuclear  study 03/2005  . Vertigo     Past Surgical History:  Procedure Laterality Date  . CATARACT EXTRACTION W/PHACO  03/29/2011   Procedure: CATARACT EXTRACTION PHACO AND INTRAOCULAR LENS PLACEMENT (IOC);  Surgeon: Tonny Branch;  Location: [facility];  Service: Ophthalmology;  Laterality: Right;  CDE 12.62  . INSERT / REPLACE / REMOVE PACEMAKER  11/2008   St.Jude DUAL chamber pacemaker 11/06/2008  . MEMBRANE PEEL Right 02/12/2014   Procedure: MEMBRANE PEEL;  Surgeon: [name], MD;  Location: [place];  Service: Ophthalmology;  Laterality: Right;  . PARS PLANA VITRECTOMY Right 02/12/2014   Procedure: PARS PLANA VITRECTOMY WITH 25 GAUGE;  Surgeon: [name], MD;  Location: [place];  Service: Ophthalmology;  Laterality: Right;  . TOTAL KNEE ARTHROPLASTY  04/2009   Right    There were no vitals filed for this visit.  Subjective Assessment - 05/04/17 0940    Subjective  Pt states had a good thanksgiving. She has been trying to do her exercises. She reports he husband is still helping her in and out of bed to roll. She has pain but is unable to rate it and states it is along her left leg form hip to knee.    Pertinent History  OA, Rt TKR, HTN, A fib, CVA  Limitations  Standing;Walking;House hold activities    Patient Stated Goals  my back to feel better.     Currently in Pain?  Yes left latearl leg, hip to knee , unable to rate when asked    Pain Score  -- unable to rate today when asked       [facility] Adult PT Treatment/Exercise - 05/04/17 0001      Transfers   Transfers  --    Supine to Sit  5: Supervision    Comments  3x supine to sit with log roll technique to R/L. Pateitn required verbal and tactile cues at start of session, however had good carryover on last 2 transfers, she requires extra time for processing and sequencing      Ambulation/Gait   Ambulation Distance (Feet)  125 Feet    Assistive device  Rolling walker    Ambulation Surface  Level    Gait velocity - backwards  50 feet,  cues for large steps and to bring walker with her, patient maitained upright posture during backwards walking with minimal verbacl/tactile cuing    Gait Comments  2 buots forward amb with RW, 50' and 90' with minimal cuing required to satnd upright "chest to ceiling" and maintain safe proximity to RW, patient had excellent carryover form start of session to end of session      Lumbar Exercises: Seated   Long Arc Quad on Chair  Strengthening;Both;1 set;10 reps;Weights    LAQ on Chair Weights (lbs)  2    Other Seated Lumbar Exercises  scapular retraction 2x 10    Other Seated Lumbar Exercises  knee flexion with red theraband, 1x 10 reps BLE      Lumbar Exercises: Supine   Bridge  10 reps;Limitations;3 seconds    Bridge Limitations  2 sets, manual facilitation to prevent hip adduction        PT Education - 05/04/17 1148    Education provided  Yes    Education Details  reviewed HEP, educated on proper form and technique for log roll to improve independence with getting in and out of bed and decrease back pain.     Person(s) Educated  Patient    Methods  Explanation    Comprehension  Verbalized understanding;Returned demonstration;Need further instruction;Tactile cues required       PT Short Term Goals - 04/25/17 1115      PT SHORT TERM GOAL #1   Title  Pt to be walking with only 10 degree forward bend to decrease stress on pt lower back.      Time  3    Period  Weeks    Status  New    Target Date  05/16/17      PT SHORT TERM GOAL #2   Title  Pt to be able to verbalize the importance of proper sitting posture for back care     Time  3    Period  Weeks    Status  New      PT SHORT TERM GOAL #3   Title  Pt core and LE strength increased where pt states that she is not having any difficulty rolling in bed    Time  3    Period  Weeks    Status  New      PT SHORT TERM GOAL #4   Title  PT to state that the furthest down her legs her pain goes is into her hips to demonstrate  decreased nerve irritation  Time  3    Period  Weeks    Status  New        PT Long Term Goals - 04/25/17 1200      PT LONG TERM GOAL #1   Title  Pt pain in low back to be no greater than a 6/10 to allow pt to ambulate 300 ft in 3 minutes to improve community ambulation     Time  6    Period  Weeks    Status  New    Target Date  06/06/17      PT LONG TERM GOAL #2   Title  Pt to be able to single leg stance on both feet for 5 seconds to reduce risk of falling     Time  6    Period  Weeks    Status  New      PT LONG TERM GOAL #3   Title  Pt to be able to walk with no forward bend at waist to reduce stress on pt lower back.     Time  6    Period  Weeks    Status  New      PT LONG TERM GOAL #4   Title  Pt to states that she has not had any radicular pain in either legs the past week     Time  6    Period  Weeks    Status  New        Plan - 05/04/17 1150    Clinical Impression Statement  Pateint is progressing in therapy and had good carry over with safe use of RW from start of session to end. PT adjusted RW for proper height resulting in improved upright posture for patient while ambulating. Session focused on log roll technique for supinjt<>sit transfer training to decrease stress on back and improve patients functional independence at home. She continues to require extra time for processing and sequecning all activities and will benefit form skilled PT to improve safety wtih DME, strength, and overall functional independence.    PT Frequency  3x / week    PT Duration  6 weeks    PT Treatment/Interventions  ADLs/Self Care Home Management;Gait training;Functional mobility training;Therapeutic activities;Therapeutic exercise;Balance training;Neuromuscular re-education;Patient/family education;Manual techniques    PT Next Visit Plan  Reviwe log roll technique. Continue with exercise instruction in correct form. Progress to decompression t-band exercises when patient is  independent with general 1-5 decompression exercises. Continue to focus on improving posture while ambulating, Continue with retro-walkign for posture and hip extension strengthening.    PT Home Exercise Plan  eval:  sitting scapular retraction, glut sets, ab sets and tall sititng     Consulted and Agree with Plan of Care  Patient       Patient will benefit from skilled therapeutic intervention in order to improve the following deficits and impairments:  Abnormal gait, Decreased activity tolerance, Decreased balance, Decreased range of motion, Decreased strength, Difficulty walking, Postural dysfunction, Improper body mechanics, Pain  Visit Diagnosis: Radiculopathy, lumbar region  Difficulty in walking, not elsewhere classified  Abnormal posture     Problem List Patient Active Problem List   Diagnosis Date Noted  . Controlled type 2 diabetes mellitus with complication, without long-term current use of insulin ([place]) 07/08/2016  . Prediabetes 12/25/2015  . 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 Vitreous hemorrhage ([place]) 02/12/2014  . TIA (transient ischemic attack) 08/20/2013  . Encounter for therapeutic drug monitoring 06/28/2013  .  Gout 11/14/2012  . Fasting hyperglycemia 04/06/2012  . Atrial fibrillation ([place]) 08/31/2011  . Chronic anticoagulation 12/16/2010  . 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 Cerebrovascular accident ([place])   . DJD (degenerative joint disease)   . Hypertension 02/21/2009  . Sick sinus syndrome ([place]) 02/21/2009  . Hyperlipidemia 11/26/2008  . SEIZURE DISORDER 10/31/2008    Debara Pickett, PT, DPT Physical Therapist with [facility]  05/04/2017 11:57 AM    [place], [place], [address] Phone: [phone]   Fax:  [phone]  Name: [name] MRN: [id number] Date of Birth: 30-Mar-1927

## 2017-05-06 ENCOUNTER — Encounter (HOSPITAL_COMMUNITY): Payer: Self-pay | Admitting: Physical Therapy

## 2017-05-06 ENCOUNTER — Ambulatory Visit (HOSPITAL_COMMUNITY): Payer: Medicare HMO | Admitting: Physical Therapy

## 2017-05-06 DIAGNOSIS — R262 Difficulty in walking, not elsewhere classified: Secondary | ICD-10-CM

## 2017-05-06 DIAGNOSIS — M5416 Radiculopathy, lumbar region: Secondary | ICD-10-CM | POA: Diagnosis not present

## 2017-05-06 DIAGNOSIS — R293 Abnormal posture: Secondary | ICD-10-CM

## 2017-05-06 NOTE — Therapy (Signed)
[place], [place], [address] Phone: [phone]   Fax:  [phone]  Physical Therapy Treatment  Patient Details  Name: [name] MRN: [id number] Date of Birth: 1927/05/27 Referring Provider: Sallee Lange    Encounter Date: 05/06/2017  PT End of Session - 05/06/17 1157    Visit Number  4    Number of Visits  18    Date for PT Re-Evaluation  05/25/17    Authorization Type  [employer] medicare     Authorization - Visit Number  4    Authorization - Number of Visits  10    PT Start Time  1120    PT Stop Time  1200    PT Time Calculation (min)  40 min    Activity Tolerance  Patient tolerated treatment well    Behavior During Therapy  [facility] for tasks assessed/performed       Past Medical History:  Diagnosis Date  . Anemia    H/H of 11.3/36.7 in 12/2008 ;normal MCV; normal CBC in 2011  . Angioedema 08/2006  . Atrial fibrillation (HCC)    persistant  . Blood transfusion   . Cerebrovascular accident [facility]) 2006   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 ;magnetic MRI->  multiple cerebral infarctions; Rx-ASA but subsequently changed to coumadin  when found to have rheumatic mitral valve disease carotid duplex mild plaque in 08/2008  . Chronic bronchitis   . Chronic renal insufficiency    baseline creatine 1.4; 1.04 in 03/2010  . DJD (degenerative joint disease)    hands, knees  . Gout   . Hyperlipidemia   . Hypertension    heart disease diastolic dysfunction ; KB-52%; mild pulmonary edema in 2006;responded to diurectics ; LVH  . Kidney stones   . Mitral valve disease    Severe mitral annular calcification; mild to moderate stenosis-not clearly rheumatic  . Nephrolithiasis   . Pacemaker   . Retinal hemorrhage   . Seizure disorder ([place])   . Shortness of breath    only with exertion  . Stroke ([place])   . Tachycardia-bradycardia syndrome ([place])    with pauses and syncope;PPM implantation 10/2008,negative stress nuclear  study 03/2005  . Vertigo     Past Surgical History:  Procedure Laterality Date  . CATARACT EXTRACTION W/PHACO  03/29/2011   Procedure: CATARACT EXTRACTION PHACO AND INTRAOCULAR LENS PLACEMENT (IOC);  Surgeon: Tonny Branch;  [facility]: [facility];  Service: Ophthalmology;  Laterality: Right;  CDE 12.62  . INSERT / REPLACE / REMOVE PACEMAKER  11/2008   St.Jude DUAL chamber pacemaker 11/06/2008  . MEMBRANE PEEL Right 02/12/2014   Procedure: MEMBRANE PEEL;  Surgeon: [name], MD;  Location: [place];  Service: Ophthalmology;  Laterality: Right;  . PARS PLANA VITRECTOMY Right 02/12/2014   Procedure: PARS PLANA VITRECTOMY WITH 25 GAUGE;  Surgeon: [name], MD;  Location: [place];  Service: Ophthalmology;  Laterality: Right;  . TOTAL KNEE ARTHROPLASTY  04/2009   Right    There were no vitals filed for this visit.  Subjective Assessment - 05/06/17 1121    Subjective  [name] states that she is not having any pain at this time.  Walking seems to be easier with her walker     Pertinent History  OA, Rt TKR, HTN, A fib, CVA    Limitations  Standing;Walking;House hold activities    How long can you sit comfortably?  able to sit for as long as she wants  How long can you stand comfortably?  less than five minutes     How long can you walk comfortably?  walks only short distances with her walker; (pt fell las Thanksgiving and has been on a walker ever since)     Patient Stated Goals  my back to feel better.     Currently in Pain?  No/denies    Pain Onset  Other (comment)                      [facility] Adult PT Treatment/Exercise - 05/06/17 0001      Exercises   Exercises  Lumbar      Lumbar Exercises: Stretches   Active Hamstring Stretch  2 reps;20 seconds      Lumbar Exercises: Standing   Other Standing Lumbar Exercises  wall arch x 5       Lumbar Exercises: Seated   Other Seated Lumbar Exercises  scapular retraction 2x 10      Lumbar Exercises: Supine   Clam  10 reps     Bridge  15 reps    Other Supine Lumbar Exercises  decompression tband exercises     Other Supine Lumbar Exercises  decompression 1-5             PT Education - 05/06/17 1156    Education provided  Yes    Education Details  decompression t-band exercises    Person(s) Educated  Patient    Methods  Explanation;Handout;Tactile cues;Demonstration;Verbal cues    Comprehension  Tactile cues required;Verbal cues required;Need further instruction       PT Short Term Goals - 05/06/17 1205      PT SHORT TERM GOAL #1   Title  Pt to be walking with only 10 degree forward bend to decrease stress on pt lower back.      Time  3    Period  Weeks    Status  Achieved      PT SHORT TERM GOAL #2   Title  Pt to be able to verbalize the importance of proper sitting posture for back care     Time  3    Period  Weeks    Status  Achieved      PT SHORT TERM GOAL #3   Title  Pt core and LE strength increased where pt states that she is not having any difficulty rolling in bed    Time  3    Period  Weeks    Status  On-going      PT SHORT TERM GOAL #4   Title  PT to state that the furthest down her legs her pain goes is into her hips to demonstrate decreased nerve irritation     Time  3    Period  Weeks    Status  Achieved        PT Long Term Goals - 05/06/17 1205      PT LONG TERM GOAL #1   Title  Pt pain in low back to be no greater than a 6/10 to allow pt to ambulate 300 ft in 3 minutes to improve community ambulation     Time  6    Period  Weeks    Status  Partially Met      PT LONG TERM GOAL #2   Title  Pt to be able to single leg stance on both feet for 5 seconds to reduce risk of falling     Time    6    Period  Weeks    Status  On-going      PT LONG TERM GOAL #3   Title  Pt to be able to walk with no forward bend at waist to reduce stress on pt lower back.     Time  6    Period  Weeks    Status  On-going      PT LONG TERM GOAL #4   Title  Pt to states that she has  not had any radicular pain in either legs the past week     Time  6    Period  Weeks    Status  Partially Met            Plan - 05/06/17 1158    Clinical Impression Statement  Pt continues to need verbal and tactile cuing for decompression exercises but is showing improved technique with bridging and scapular retraction .  Added new exercises with significant cuing needed for proper technique.      PT Frequency  3x / week    PT Duration  6 weeks    PT Treatment/Interventions  ADLs/Self Care Home Management;Gait training;Functional mobility training;Therapeutic activities;Therapeutic exercise;Balance training;Neuromuscular re-education;Patient/family education;Manual techniques    PT Next Visit Plan  Review all exercises.  We will not add any new exercises until pt is able to show I in her current program.     PT Home Exercise Plan  eval:  sitting scapular retraction, glut sets, ab sets and tall sititng     Consulted and Agree with Plan of Care  Patient       Patient will benefit from skilled therapeutic intervention in order to improve the following deficits and impairments:  Abnormal gait, Decreased activity tolerance, Decreased balance, Decreased range of motion, Decreased strength, Difficulty walking, Postural dysfunction, Improper body mechanics, Pain  Visit Diagnosis: Radiculopathy, lumbar region  Difficulty in walking, not elsewhere classified  Abnormal posture     Problem List Patient Active Problem List   Diagnosis Date Noted  . Controlled type 2 diabetes mellitus with complication, without long-term current use of insulin ([place]) 07/08/2016  . Prediabetes 12/25/2015  . 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 Vitreous hemorrhage ([place]) 02/12/2014  . TIA (transient ischemic attack) 08/20/2013  . Encounter for therapeutic drug monitoring 06/28/2013  . Gout 11/14/2012  . Fasting hyperglycemia 04/06/2012  . Atrial fibrillation ([place]) 08/31/2011  . Chronic  anticoagulation 12/16/2010  . 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 Cerebrovascular accident ([place])   . DJD (degenerative joint disease)   . Hypertension 02/21/2009  . Sick sinus syndrome ([place]) 02/21/2009  . Hyperlipidemia 11/26/2008  . SEIZURE DISORDER 10/31/2008  Rayetta Humphrey, PT CLT [phone] 05/06/2017, 12:06 PM  [place] [address] [place], [place], [address] Phone: [phone]   Fax:  [phone]  Name: [name] MRN: [id number] Date of Birth: 07-24-1926

## 2017-05-09 ENCOUNTER — Other Ambulatory Visit: Payer: Self-pay

## 2017-05-09 ENCOUNTER — Encounter (HOSPITAL_COMMUNITY): Payer: Self-pay

## 2017-05-09 ENCOUNTER — Ambulatory Visit (HOSPITAL_COMMUNITY): Payer: Medicare HMO | Attending: Family Medicine

## 2017-05-09 DIAGNOSIS — M5416 Radiculopathy, lumbar region: Secondary | ICD-10-CM | POA: Diagnosis present

## 2017-05-09 DIAGNOSIS — R262 Difficulty in walking, not elsewhere classified: Secondary | ICD-10-CM | POA: Insufficient documentation

## 2017-05-09 DIAGNOSIS — R293 Abnormal posture: Secondary | ICD-10-CM | POA: Insufficient documentation

## 2017-05-09 NOTE — Therapy (Signed)
[name], [place], [address] Phone: [phone]   Fax:  [phone]  Physical Therapy Treatment  Patient Details  Name: [name] MRN: [id number] Date of Birth: 01/08/1927 Referring Provider: Sallee Lange    Encounter Date: 05/09/2017  PT End of Session - 05/09/17 1038    Visit Number  5    Number of Visits  18    Date for PT Re-Evaluation  05/25/17    Authorization Type  [employer] medicare     Authorization - Visit Number  5    Authorization - Number of Visits  10    PT Start Time  [phone]    PT Stop Time  1028    PT Time Calculation (min)  41 min    Activity Tolerance  Patient tolerated treatment well    Behavior During Therapy  [facility] for tasks assessed/performed       Past Medical History:  Diagnosis Date  . Anemia    H/H of 11.3/36.7 in 12/2008 ;normal MCV; normal CBC in 2011  . Angioedema 08/2006  . Atrial fibrillation (HCC)    persistant  . Blood transfusion   . Cerebrovascular accident [facility]) 2006   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 ;magnetic MRI->  multiple cerebral infarctions; Rx-ASA but subsequently changed to coumadin  when found to have rheumatic mitral valve disease carotid duplex mild plaque in 08/2008  . Chronic bronchitis   . Chronic renal insufficiency    baseline creatine 1.4; 1.04 in 03/2010  . DJD (degenerative joint disease)    hands, knees  . Gout   . Hyperlipidemia   . Hypertension    heart disease diastolic dysfunction ; TG-54%; mild pulmonary edema in 2006;responded to diurectics ; LVH  . Kidney stones   . Mitral valve disease    Severe mitral annular calcification; mild to moderate stenosis-not clearly rheumatic  . Nephrolithiasis   . Pacemaker   . Retinal hemorrhage   . Seizure disorder ([place])   . Shortness of breath    only with exertion  . Stroke ([place])   . Tachycardia-bradycardia syndrome ([place])    with pauses and syncope;PPM implantation 10/2008,negative stress nuclear  study 03/2005  . Vertigo     Past Surgical History:  Procedure Laterality Date  . CATARACT EXTRACTION W/PHACO  03/29/2011   Procedure: CATARACT EXTRACTION PHACO AND INTRAOCULAR LENS PLACEMENT (IOC);  Surgeon: Tonny Branch;  [facility];  Service: Ophthalmology;  Laterality: Right;  CDE 12.62  . INSERT / REPLACE / REMOVE PACEMAKER  11/2008   St.Jude DUAL chamber pacemaker 11/06/2008  . MEMBRANE PEEL Right 02/12/2014   Procedure: MEMBRANE PEEL;  Surgeon: [name], MD;  Location: [place];  Service: Ophthalmology;  Laterality: Right;  . PARS PLANA VITRECTOMY Right 02/12/2014   Procedure: PARS PLANA VITRECTOMY WITH 25 GAUGE;  Surgeon: [name], MD;  Location: [place];  Service: Ophthalmology;  Laterality: Right;  . TOTAL KNEE ARTHROPLASTY  04/2009   Right    There were no vitals filed for this visit.  Subjective Assessment - 05/09/17 1021    Subjective  [name] states that she is not having any pain at this time.  She had a good weekend and was able to go to church yesterday; she did not feel too tired and was able to walk aith her walker. Patient is walking more upright today.    Pertinent History  OA, Rt TKR, HTN, A fib, CVA    Limitations  Standing;Walking;House hold activities    Patient Stated Goals  my back to feel better.     Currently in Pain?  No/denies       [facility] Adult PT Treatment/Exercise - 05/09/17 0001      Transfers   Comments  repeated sit to stand with RW, 2x 5 reps, patient required verbal cues for sequencing. Patient able to sequence on second set with no cues and counted out loud for cognitive dual task.      Lumbar Exercises: Seated   Other Seated Lumbar Exercises  scapular retraction 2x 10    Other Seated Lumbar Exercises  glut set for tall sitting, 1x 10      Lumbar Exercises: Supine   Clam  10 reps;Limitations    Clam Limitations  blue theraband, 2 sets    Bridge  3 seconds;10 reps;Limitations    Bridge Limitations  2 sests, maual facilitaiton  of hip alignment to prevent adduction    Other Supine Lumbar Exercises  decompression 1-5, 2 sets x 10 reps       PT Education - 05/09/17 1034    Education provided  Yes    Education Details  Educated on form for exercises and reviewed importance of breathing during exercises.    Person(s) Educated  Patient    Methods  Explanation;Tactile cues;Verbal cues    Comprehension  Tactile cues required;Verbal cues required;Need further instruction       PT Short Term Goals - 05/06/17 1205      PT SHORT TERM GOAL #1   Title  Pt to be walking with only 10 degree forward bend to decrease stress on pt lower back.      Time  3    Period  Weeks    Status  Achieved      PT SHORT TERM GOAL #2   Title  Pt to be able to verbalize the importance of proper sitting posture for back care     Time  3    Period  Weeks    Status  Achieved      PT SHORT TERM GOAL #3   Title  Pt core and LE strength increased where pt states that she is not having any difficulty rolling in bed    Time  3    Period  Weeks    Status  On-going      PT SHORT TERM GOAL #4   Title  PT to state that the furthest down her legs her pain goes is into her hips to demonstrate decreased nerve irritation     Time  3    Period  Weeks    Status  Achieved        PT Long Term Goals - 05/06/17 1205      PT LONG TERM GOAL #1   Title  Pt pain in low back to be no greater than a 6/10 to allow pt to ambulate 300 ft in 3 minutes to improve community ambulation     Time  6    Period  Weeks    Status  Partially Met      PT LONG TERM GOAL #2   Title  Pt to be able to single leg stance on both feet for 5 seconds to reduce risk of falling     Time  6    Period  Weeks    Status  On-going      PT LONG TERM GOAL #3   Title  Pt to  be able to walk with no forward bend at waist to reduce stress on pt lower back.     Time  6    Period  Weeks    Status  On-going      PT LONG TERM GOAL #4   Title  Pt to states that she has not had  any radicular pain in either legs the past week     Time  6    Period  Weeks    Status  Partially Met        Plan - 05/09/17 1039    Clinical Impression Statement  Patient is making gradual progress with PT and continues to require verbal/tactile cues for proper sequencing and form with exercises. She had improved scapular retractions and glut sets in sitting. Transfer trainging for safe use of RW during Sit to Stands was performed; patient required cues at end of session indicating poor carryover. She will continue to benefit from skilled PT to address current impairments.    PT Frequency  3x / week    PT Duration  6 weeks    PT Treatment/Interventions  ADLs/Self Care Home Management;Gait training;Functional mobility training;Therapeutic activities;Therapeutic exercise;Balance training;Neuromuscular re-education;Patient/family education;Manual techniques    PT Next Visit Plan  Review all exercises.  Review safe use of RW with sit<>stands and safe log roll technique. We will not add any new exercises until pt is able to show I in her current program.     PT Home Exercise Plan  eval:  sitting scapular retraction, glut sets, ab sets and tall sititng     Consulted and Agree with Plan of Care  Patient       Patient will benefit from skilled therapeutic intervention in order to improve the following deficits and impairments:  Abnormal gait, Decreased activity tolerance, Decreased balance, Decreased range of motion, Decreased strength, Difficulty walking, Postural dysfunction, Improper body mechanics, Pain  Visit Diagnosis: Radiculopathy, lumbar region  Difficulty in walking, not elsewhere classified  Abnormal posture     Problem List Patient Active Problem List   Diagnosis Date Noted  . Controlled type 2 diabetes mellitus with complication, without long-term current use of insulin ([place]) 07/08/2016  . Prediabetes 12/25/2015  . 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 Vitreous  hemorrhage ([place]) 02/12/2014  . TIA (transient ischemic attack) 08/20/2013  . Encounter for therapeutic drug monitoring 06/28/2013  . Gout 11/14/2012  . Fasting hyperglycemia 04/06/2012  . Atrial fibrillation ([place]) 08/31/2011  . Chronic anticoagulation 12/16/2010  . 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 Cerebrovascular accident ([place])   . DJD (degenerative joint disease)   . Hypertension 02/21/2009  . Sick sinus syndrome ([place]) 02/21/2009  . Hyperlipidemia 11/26/2008  . SEIZURE DISORDER 10/31/2008    Debara Pickett, PT, DPT Physical Therapist with [facility]  05/09/2017 10:44 AM    [place], [place], [address] Phone: [phone]   Fax:  [phone]  Name: [name] MRN: [id number] Date of Birth: 06-28-1926

## 2017-05-11 ENCOUNTER — Ambulatory Visit (HOSPITAL_COMMUNITY): Payer: Medicare HMO | Admitting: Physical Therapy

## 2017-05-11 DIAGNOSIS — M5416 Radiculopathy, lumbar region: Secondary | ICD-10-CM

## 2017-05-11 DIAGNOSIS — R293 Abnormal posture: Secondary | ICD-10-CM

## 2017-05-11 DIAGNOSIS — R262 Difficulty in walking, not elsewhere classified: Secondary | ICD-10-CM

## 2017-05-11 NOTE — Therapy (Signed)
[place], [place], [address] Phone: [phone]   Fax:  [phone]  Physical Therapy Treatment  Patient Details  Name: [name] MRN: [id number] Date of Birth: 07/05/1926 Referring Provider: Sallee Lange    Encounter Date: 05/11/2017  PT End of Session - 05/11/17 1100    Visit Number  6    Number of Visits  18    Date for PT Re-Evaluation  05/25/17    Authorization Type  [employer] medicare     Authorization - Visit Number  6    Authorization - Number of Visits  10    PT Start Time  [phone]    PT Stop Time  1045    PT Time Calculation (min)  50 min    Activity Tolerance  Patient tolerated treatment well    Behavior During Therapy  [facility] for tasks assessed/performed       Past Medical History:  Diagnosis Date  . Anemia    H/H of 11.3/36.7 in 12/2008 ;normal MCV; normal CBC in 2011  . Angioedema 08/2006  . Atrial fibrillation (HCC)    persistant  . Blood transfusion   . Cerebrovascular accident [facility]) 2006   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 ;magnetic MRI->  multiple cerebral infarctions; Rx-ASA but subsequently changed to coumadin  when found to have rheumatic mitral valve disease carotid duplex mild plaque in 08/2008  . Chronic bronchitis   . Chronic renal insufficiency    baseline creatine 1.4; 1.04 in 03/2010  . DJD (degenerative joint disease)    hands, knees  . Gout   . Hyperlipidemia   . Hypertension    heart disease diastolic dysfunction ; VQ-00%; mild pulmonary edema in 2006;responded to diurectics ; LVH  . Kidney stones   . Mitral valve disease    Severe mitral annular calcification; mild to moderate stenosis-not clearly rheumatic  . Nephrolithiasis   . Pacemaker   . Retinal hemorrhage   . Seizure disorder ([place])   . Shortness of breath    only with exertion  . Stroke ([place])   . Tachycardia-bradycardia syndrome ([place])    with pauses and syncope;PPM implantation 10/2008,negative stress nuclear  study 03/2005  . Vertigo     Past Surgical History:  Procedure Laterality Date  . CATARACT EXTRACTION W/PHACO  03/29/2011   Procedure: CATARACT EXTRACTION PHACO AND INTRAOCULAR LENS PLACEMENT (IOC);  Surgeon: Tonny Branch;  Location: [facility];  Service: Ophthalmology;  Laterality: Right;  CDE 12.62  . INSERT / REPLACE / REMOVE PACEMAKER  11/2008   [employer]ude DUAL chamber pacemaker 11/06/2008  . MEMBRANE PEEL Right 02/12/2014   Procedure: MEMBRANE PEEL;  Surgeon: [name], MD;  Location: [place];  Service: Ophthalmology;  Laterality: Right;  . PARS PLANA VITRECTOMY Right 02/12/2014   Procedure: PARS PLANA VITRECTOMY WITH 25 GAUGE;  Surgeon: [name], MD;  Location: [place];  Service: Ophthalmology;  Laterality: Right;  . TOTAL KNEE ARTHROPLASTY  04/2009   Right    There were no vitals filed for this visit.  Subjective Assessment - 05/11/17 1007    Subjective  Pt states she is having a little discomfort in her Lt hip but not bad at all.   Walking with her RW today.    Currently in Pain?  No/denies                      [facility] Adult PT Treatment/Exercise - 05/11/17 0001  Transfers   Comments  STS without UE assist 10 reps      Lumbar Exercises: Seated   Other Seated Lumbar Exercises  scapular retraction 2x 10    Other Seated Lumbar Exercises  glut set for tall sitting, 1x 10      Lumbar Exercises: Supine   Clam  10 reps;Limitations    Clam Limitations  blue theraband, 2 sets    Bridge  3 seconds;10 reps;Limitations    Other Supine Lumbar Exercises  logrolling supine <--> sit     Other Supine Lumbar Exercises  decompression 1-5, 2 sets x 10 reps               PT Short Term Goals - 05/06/17 1205      PT SHORT TERM GOAL #1   Title  Pt to be walking with only 10 degree forward bend to decrease stress on pt lower back.      Time  3    Period  Weeks    Status  Achieved      PT SHORT TERM GOAL #2   Title  Pt to be able to verbalize the importance of  proper sitting posture for back care     Time  3    Period  Weeks    Status  Achieved      PT SHORT TERM GOAL #3   Title  Pt core and LE strength increased where pt states that she is not having any difficulty rolling in bed    Time  3    Period  Weeks    Status  On-going      PT SHORT TERM GOAL #4   Title  PT to state that the furthest down her legs her pain goes is into her hips to demonstrate decreased nerve irritation     Time  3    Period  Weeks    Status  Achieved        PT Long Term Goals - 05/06/17 1205      PT LONG TERM GOAL #1   Title  Pt pain in low back to be no greater than a 6/10 to allow pt to ambulate 300 ft in 3 minutes to improve community ambulation     Time  6    Period  Weeks    Status  Partially Met      PT LONG TERM GOAL #2   Title  Pt to be able to single leg stance on both feet for 5 seconds to reduce risk of falling     Time  6    Period  Weeks    Status  On-going      PT LONG TERM GOAL #3   Title  Pt to be able to walk with no forward bend at waist to reduce stress on pt lower back.     Time  6    Period  Weeks    Status  On-going      PT LONG TERM GOAL #4   Title  Pt to states that she has not had any radicular pain in either legs the past week     Time  6    Period  Weeks    Status  Partially Met            Plan - 05/11/17 1102    Clinical Impression Statement  Pt still with difficulty coordinating and completing logroll technique, rolling to side appears challenging.  Increased difficulty of  sit to stand actvitiy wtih discontinuation of walker and UE's.  Pt able to complete with good form and noted fatigue by last several repetitions.      PT Frequency  3x / week    PT Duration  6 weeks    PT Treatment/Interventions  ADLs/Self Care Home Management;Gait training;Functional mobility training;Therapeutic activities;Therapeutic exercise;Balance training;Neuromuscular re-education;Patient/family education;Manual techniques    PT Next  Visit Plan  continue to work on log roll technique and general safety with transfers and ambulation. We will not add any new exercises until pt is able to show I in her current program.     PT Home Exercise Plan  eval:  sitting scapular retraction, glut sets, ab sets and tall sititng     Consulted and Agree with Plan of Care  Patient       Patient will benefit from skilled therapeutic intervention in order to improve the following deficits and impairments:  Abnormal gait, Decreased activity tolerance, Decreased balance, Decreased range of motion, Decreased strength, Difficulty walking, Postural dysfunction, Improper body mechanics, Pain  Visit Diagnosis: Radiculopathy, lumbar region  Difficulty in walking, not elsewhere classified  Abnormal posture     Problem List Patient Active Problem List   Diagnosis Date Noted  . Controlled type 2 diabetes mellitus with complication, without long-term current use of insulin ([place]) 07/08/2016  . Prediabetes 12/25/2015  . 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 Vitreous hemorrhage ([place]) 02/12/2014  . TIA (transient ischemic attack) 08/20/2013  . Encounter for therapeutic drug monitoring 06/28/2013  . Gout 11/14/2012  . Fasting hyperglycemia 04/06/2012  . Atrial fibrillation ([place]) 08/31/2011  . Chronic anticoagulation 12/16/2010  . 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 Cerebrovascular accident ([place])   . DJD (degenerative joint disease)   . Hypertension 02/21/2009  . Sick sinus syndrome ([place]) 02/21/2009  . Hyperlipidemia 11/26/2008  . SEIZURE DISORDER 10/31/2008   Teena Irani, PTA/CLT [phone]  Teena Irani 05/11/2017, 11:07 AM  [place], [place], [address] Phone: [phone]   Fax:  [phone]  Name: [name] MRN: [id number] Date of Birth: July 17, 1926

## 2017-05-13 ENCOUNTER — Encounter (HOSPITAL_COMMUNITY): Payer: Self-pay | Admitting: Physical Therapy

## 2017-05-13 ENCOUNTER — Ambulatory Visit (HOSPITAL_COMMUNITY): Payer: Medicare HMO | Admitting: Physical Therapy

## 2017-05-13 DIAGNOSIS — R262 Difficulty in walking, not elsewhere classified: Secondary | ICD-10-CM

## 2017-05-13 DIAGNOSIS — R293 Abnormal posture: Secondary | ICD-10-CM

## 2017-05-13 DIAGNOSIS — M5416 Radiculopathy, lumbar region: Secondary | ICD-10-CM

## 2017-05-13 NOTE — Therapy (Signed)
[place], [place], [address] Phone: [phone]   Fax:  [phone]  Physical Therapy Treatment  Patient Details  Name: [name] MRN: [id number] Date of Birth: [birth year] Referring Provider: Sallee Lange    Encounter Date: 05/13/2017  PT End of Session - 05/13/17 1119    Visit Number  7    Number of Visits  18    Date for PT Re-Evaluation  05/25/17    Authorization Type  [employer] medicare     Authorization - Visit Number  7    Authorization - Number of Visits  10    PT Start Time  2993    PT Stop Time  1118    PT Time Calculation (min)  43 min    Activity Tolerance  Patient tolerated treatment well    Behavior During Therapy  [facility] for tasks assessed/performed       Past Medical History:  Diagnosis Date  . Anemia    H/H of 11.3/36.7 in 12/2008 ;normal MCV; normal CBC in 2011  . Angioedema 08/2006  . Atrial fibrillation (HCC)    persistant  . Blood transfusion   . 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 Chronic bronchitis   . Chronic renal insufficiency    baseline creatine 1.4; 1.04 in 03/2010  . DJD (degenerative joint disease)    hands, knees  . Gout   . Hyperlipidemia   . Hypertension    heart disease diastolic dysfunction ; ZJ-69%; mild pulmonary edema in 2006;responded to diurectics ; LVH  . Kidney stones   . Mitral valve disease    Severe mitral annular calcification; mild to moderate stenosis-not clearly rheumatic  . Nephrolithiasis   . Pacemaker   . Retinal hemorrhage   . Seizure disorder ([place])   . Shortness of breath    only with exertion  . Stroke ([place])   . Tachycardia-bradycardia syndrome ([place])    with pauses and syncope;PPM implantation 10/2008,negative stress nuclear  study 03/2005  . Vertigo     Past Surgical History:  Procedure Laterality Date  . CATARACT EXTRACTION W/PHACO  03/29/2011   Procedure: CATARACT EXTRACTION PHACO AND INTRAOCULAR LENS PLACEMENT (IOC);  Surgeon: Tonny Branch;  [facility];  Service: Ophthalmology;  Laterality: Right;  CDE 12.62  . INSERT / REPLACE / REMOVE PACEMAKER  11/2008   [employer]ude DUAL chamber pacemaker 11/06/2008  . MEMBRANE PEEL Right 02/12/2014   Procedure: MEMBRANE PEEL;  Surgeon: [name], MD;  Location: [place];  Service: Ophthalmology;  Laterality: Right;  . PARS PLANA VITRECTOMY Right 02/12/2014   Procedure: PARS PLANA VITRECTOMY WITH 25 GAUGE;  Surgeon: [name], MD;  Location: [place];  Service: Ophthalmology;  Laterality: Right;  . TOTAL KNEE ARTHROPLASTY  04/2009   Right    There were no vitals filed for this visit.  Subjective Assessment - 05/13/17 1035    Subjective  Pt states that her back is feeling good     Pertinent History  OA, Rt TKR, HTN, A fib, CVA    Limitations  Standing;Walking;House hold activities    Patient Stated Goals  my back to feel better.     Currently in Pain?  No/denies  [place] Adult PT Treatment/Exercise - 05/13/17 0001      Transfers   Comments  STS without UE assist 10 reps      Ambulation/Gait   Ambulation Distance (Feet)  226 Feet    Assistive device  Straight cane    Ambulation Surface  Level      Exercises   Exercises  Lumbar      Lumbar Exercises: Standing   Heel Raises  10 reps heels do not clear the floor     Functional Squats  10 reps    Forward Lunge  10 reps    Other Standing Lumbar Exercises  hip extendion/abduction x 10       Lumbar Exercises: Seated   Sit to Stand  10 reps    Other Seated Lumbar Exercises  scapular retraction 2x 10    Other Seated Lumbar Exercises  glut set for tall sitting, 1x 10      Lumbar Exercises: Supine   Clam  10 reps;Limitations    Clam Limitations  blue theraband, 2 sets     Bridge  3 seconds;10 reps;Limitations    Other Supine Lumbar Exercises  logrolling supine <--> sit     Other Supine Lumbar Exercises  decompression 1-5, 2 sets x 10 reps               PT Short Term Goals - 05/06/17 1205      PT SHORT TERM GOAL #1   Title  Pt to be walking with only 10 degree forward bend to decrease stress on pt lower back.      Time  3    Period  Weeks    Status  Achieved      PT SHORT TERM GOAL #2   Title  Pt to be able to verbalize the importance of proper sitting posture for back care     Time  3    Period  Weeks    Status  Achieved      PT SHORT TERM GOAL #3   Title  Pt core and LE strength increased where pt states that she is not having any difficulty rolling in bed    Time  3    Period  Weeks    Status  On-going      PT SHORT TERM GOAL #4   Title  PT to state that the furthest down her legs her pain goes is into her hips to demonstrate decreased nerve irritation     Time  3    Period  Weeks    Status  Achieved        PT Long Term Goals - 05/06/17 1205      PT LONG TERM GOAL #1   Title  Pt pain in low back to be no greater than a 6/10 to allow pt to ambulate 300 ft in 3 minutes to improve community ambulation     Time  6    Period  Weeks    Status  Partially Met      PT LONG TERM GOAL #2   Title  Pt to be able to single leg stance on both feet for 5 seconds to reduce risk of falling     Time  6    Period  Weeks    Status  On-going      PT LONG TERM GOAL #3   Title  Pt to be able to walk with no forward bend at waist to reduce stress on pt  lower back.     Time  6    Period  Weeks    Status  On-going      PT LONG TERM GOAL #4   Title  Pt to states that she has not had any radicular pain in either legs the past week     Time  6    Period  Weeks    Status  Partially Met            Plan - 05/13/17 1120    Clinical Impression Statement  Began Weight bearing and balance exercises with good technique.  Began gait training pt  with the cane again with pt able to demonstrate increased use of core mm with ambulation     PT Frequency  3x / week    PT Duration  6 weeks    PT Treatment/Interventions  ADLs/Self Care Home Management;Gait training;Functional mobility training;Therapeutic activities;Therapeutic exercise;Balance training;Neuromuscular re-education;Patient/family education;Manual techniques    PT Next Visit Plan  Continue to work on ambulation with the cane.   Pt appears to do better with weightbearing activity.    PT Home Exercise Plan  eval:  sitting scapular retraction, glut sets, ab sets and tall sititng     Consulted and Agree with Plan of Care  Patient       Patient will benefit from skilled therapeutic intervention in order to improve the following deficits and impairments:  Abnormal gait, Decreased activity tolerance, Decreased balance, Decreased range of motion, Decreased strength, Difficulty walking, Postural dysfunction, Improper body mechanics, Pain  Visit Diagnosis: Radiculopathy, lumbar region  Difficulty in walking, not elsewhere classified  Abnormal posture     Problem List Patient Active Problem List   Diagnosis Date Noted  . Controlled type 2 diabetes mellitus with complication, without long-term current use of insulin ([place]) 07/08/2016  . Prediabetes 12/25/2015  . 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 Vitreous hemorrhage ([place]) 02/12/2014  . TIA (transient ischemic attack) 08/20/2013  . Encounter for therapeutic drug monitoring 06/28/2013  . Gout 11/14/2012  . Fasting hyperglycemia 04/06/2012  . Atrial fibrillation ([place]) 08/31/2011  . Chronic anticoagulation 12/16/2010  . 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 Cerebrovascular accident ([place])   . DJD (degenerative joint disease)   . Hypertension 02/21/2009  . Sick sinus syndrome ([place]) 02/21/2009  . Hyperlipidemia 11/26/2008  . SEIZURE DISORDER 10/31/2008    Rayetta Humphrey, PT CLT [phone] 05/13/2017, [place], [place], [address] Phone: [phone]   Fax:  [phone]  Name: [name] MRN: [id number] Date of Birth: 03-21-1927

## 2017-05-16 ENCOUNTER — Ambulatory Visit (HOSPITAL_COMMUNITY): Payer: Medicare HMO | Admitting: Physical Therapy

## 2017-05-18 ENCOUNTER — Telehealth (HOSPITAL_COMMUNITY): Payer: Self-pay | Admitting: Family Medicine

## 2017-05-18 ENCOUNTER — Ambulatory Visit (HOSPITAL_COMMUNITY): Payer: Medicare HMO | Admitting: Physical Therapy

## 2017-05-18 ENCOUNTER — Encounter: Payer: Self-pay | Admitting: *Deleted

## 2017-05-18 NOTE — Telephone Encounter (Signed)
05/18/17  pt cx said that they can't get the car out of the driveway

## 2017-05-20 ENCOUNTER — Telehealth (HOSPITAL_COMMUNITY): Payer: Self-pay | Admitting: Physical Therapy

## 2017-05-20 ENCOUNTER — Ambulatory Visit (HOSPITAL_COMMUNITY): Payer: Medicare HMO | Admitting: Physical Therapy

## 2017-05-20 NOTE — Telephone Encounter (Signed)
Called pt and scheduled more appointments. NF 05/20/17

## 2017-05-20 NOTE — Telephone Encounter (Signed)
Pt state that she called due to a tire being flat.  Pt will not be able to make today's appointment.  Pt has no future visits scheduled will have desk call pt with returning appointment.   Rayetta Humphrey, [place] CLT [phone]

## 2017-05-23 ENCOUNTER — Encounter (HOSPITAL_COMMUNITY): Payer: Self-pay

## 2017-05-23 ENCOUNTER — Telehealth (HOSPITAL_COMMUNITY): Payer: Self-pay | Admitting: Physical Therapy

## 2017-05-23 ENCOUNTER — Ambulatory Visit (HOSPITAL_COMMUNITY): Payer: Medicare HMO

## 2017-05-23 DIAGNOSIS — R293 Abnormal posture: Secondary | ICD-10-CM

## 2017-05-23 DIAGNOSIS — M5416 Radiculopathy, lumbar region: Secondary | ICD-10-CM

## 2017-05-23 DIAGNOSIS — R262 Difficulty in walking, not elsewhere classified: Secondary | ICD-10-CM

## 2017-05-23 NOTE — Telephone Encounter (Signed)
Patient came on the wrong day and was able to be seen by PT

## 2017-05-23 NOTE — Therapy (Signed)
[place], [place], [address] Phone: [phone]   Fax:  [phone]  Physical Therapy Treatment  Patient Details  Name: [name] MRN: [id number] Date of Birth: [birth year] Referring Provider: Sallee Lange    Encounter Date: 05/23/2017  PT End of Session - 05/23/17 0957    Visit Number  8    Number of Visits  18    Date for PT Re-Evaluation  05/25/17    Authorization Type  [employer]na medicare     Authorization - Visit Number  8    Authorization - Number of Visits  10    PT Start Time  0945    PT Stop Time  1030    PT Time Calculation (min)  45 min    Activity Tolerance  Patient tolerated treatment well    Behavior During Therapy  [facility] for tasks assessed/performed       Past Medical History:  Diagnosis Date  . Anemia    H/H of 11.3/36.7 in 12/2008 ;normal MCV; normal CBC in 2011  . Angioedema 08/2006  . Atrial fibrillation (HCC)    persistant  . Blood transfusion   . 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 Chronic bronchitis   . Chronic renal insufficiency    baseline creatine 1.4; 1.04 in 03/2010  . DJD (degenerative joint disease)    hands, knees  . Gout   . Hyperlipidemia   . Hypertension    heart disease diastolic dysfunction ; GB-15%; mild pulmonary edema in 2006;responded to diurectics ; LVH  . Kidney stones   . Mitral valve disease    Severe mitral annular calcification; mild to moderate stenosis-not clearly rheumatic  . Nephrolithiasis   . Pacemaker   . Retinal hemorrhage   . Seizure disorder ([place])   . Shortness of breath    only with exertion  . Stroke ([place])   . Tachycardia-bradycardia syndrome ([place])    with pauses and syncope;PPM implantation 10/2008,negative stress nuclear  study 03/2005  . Vertigo     Past Surgical History:  Procedure Laterality Date  . CATARACT EXTRACTION W/PHACO  03/29/2011   Procedure: CATARACT EXTRACTION PHACO AND INTRAOCULAR LENS PLACEMENT (IOC);  Surgeon: Tonny Branch;  Location: [facility];  Service: Ophthalmology;  Laterality: Right;  CDE 12.62  . INSERT / REPLACE / REMOVE PACEMAKER  11/2008   St.Jude DUAL chamber pacemaker 11/06/2008  . MEMBRANE PEEL Right 02/12/2014   Procedure: MEMBRANE PEEL;  Surgeon: [name], MD;  Location: [place];  Service: Ophthalmology;  Laterality: Right;  . PARS PLANA VITRECTOMY Right 02/12/2014   Procedure: PARS PLANA VITRECTOMY WITH 25 GAUGE;  Surgeon: [name], MD;  Location: [place];  Service: Ophthalmology;  Laterality: Right;  . TOTAL KNEE ARTHROPLASTY  04/2009   Right    There were no vitals filed for this visit.  Subjective Assessment - 05/23/17 0956    Subjective  Pt states that her back is feeling pretty good. She reports waking up feeling dizzy and lightheaded but now she feels okay.     Currently in Pain?  No/denies                      [facility] Adult PT Treatment/Exercise - 05/23/17 0001      Transfers  Comments  STS without UE assist 10 reps      Ambulation/Gait   Ambulation Distance (Feet)  226 Feet break half way due to knee pain    Assistive device  Straight cane    Ambulation Surface  Level      Lumbar Exercises: Standing   Heel Raises  15 reps    Functional Squats  10 reps    Forward Lunge  10 reps    Other Standing Lumbar Exercises  hip extension/abduction x 10     Other Standing Lumbar Exercises  side step 3 RT in // bars with bilateral UE assist       Lumbar Exercises: Seated   Sit to Stand  10 reps    Other Seated Lumbar Exercises  scapular retraction x 10      Knee/Hip Exercises: Standing   Knee Flexion  20 reps    Knee Flexion Limitations  toe tap "marching" on 4" step             PT Education - 05/23/17 1027    Education provided  Yes     Education Details  Pt educated on bed mobility rolling and utilizing UE assist to push into sitting position from supine, instead of pt verbalization of using LE momentum in supine to get into seated position. Trial to decrease dizziness wehn getting out of bed.     Person(s) Educated  Patient    Methods  Explanation    Comprehension  Verbalized understanding       PT Short Term Goals - 05/06/17 1205      PT SHORT TERM GOAL #1   Title  Pt to be walking with only 10 degree forward bend to decrease stress on pt lower back.      Time  3    Period  Weeks    Status  Achieved      PT SHORT TERM GOAL #2   Title  Pt to be able to verbalize the importance of proper sitting posture for back care     Time  3    Period  Weeks    Status  Achieved      PT SHORT TERM GOAL #3   Title  Pt core and LE strength increased where pt states that she is not having any difficulty rolling in bed    Time  3    Period  Weeks    Status  On-going      PT SHORT TERM GOAL #4   Title  PT to state that the furthest down her legs her pain goes is into her hips to demonstrate decreased nerve irritation     Time  3    Period  Weeks    Status  Achieved        PT Long Term Goals - 05/06/17 1205      PT LONG TERM GOAL #1   Title  Pt pain in low back to be no greater than a 6/10 to allow pt to ambulate 300 ft in 3 minutes to improve community ambulation     Time  6    Period  Weeks    Status  Partially Met      PT LONG TERM GOAL #2   Title  Pt to be able to single leg stance on both feet for 5 seconds to reduce risk of falling     Time  6    Period  Weeks    Status  On-going  PT LONG TERM GOAL #3   Title  Pt to be able to walk with no forward bend at waist to reduce stress on pt lower back.     Time  6    Period  Weeks    Status  On-going      PT LONG TERM GOAL #4   Title  Pt to states that she has not had any radicular pain in either legs the past week     Time  6    Period  Weeks    Status   Partially Met            Plan - 05/23/17 1026    Clinical Impression Statement  Today's session continued to focus on standing functional strength and ambulation with less restrictive AD. She required a rest break half way through gait ambulation today secondary to complaints of knee pain. She continues to present with decreased ability to control decent with STS this session without UE assist. Patient tolerated addition of standing marching and side steps well today. She required verbal cueing for form and posture throughout hip extension exercises this session with visual cueing prn. Overall, pt tolerated session well today and is continuing to progress.     PT Frequency  3x / week    PT Duration  6 weeks    PT Treatment/Interventions  ADLs/Self Care Home Management;Gait training;Functional mobility training;Therapeutic activities;Therapeutic exercise;Balance training;Neuromuscular re-education;Patient/family education;Manual techniques    PT Next Visit Plan  Continue to work on ambulation with the cane. Continue focus on WB activities transitioning from supine. Follow up on bed mobility education     PT Home Exercise Plan  eval:  sitting scapular retraction, glut sets, ab sets and tall sititng     Consulted and Agree with Plan of Care  Patient       Patient will benefit from skilled therapeutic intervention in order to improve the following deficits and impairments:  Abnormal gait, Decreased activity tolerance, Decreased balance, Decreased range of motion, Decreased strength, Difficulty walking, Postural dysfunction, Improper body mechanics, Pain  Visit Diagnosis: Radiculopathy, lumbar region  Difficulty in walking, not elsewhere classified  Abnormal posture     Problem List Patient Active Problem List   Diagnosis Date Noted  . Controlled type 2 diabetes mellitus with complication, without long-term current use of insulin ([place]) 07/08/2016  . Prediabetes 12/25/2015  . Retinal  macroaneurysm of right eye 02/12/2014  . Vitreous hemorrhage ([place]) 02/12/2014  . TIA (transient ischemic attack) 08/20/2013  . Encounter for therapeutic drug monitoring 06/28/2013  . Gout 11/14/2012  . Fasting hyperglycemia 04/06/2012  . Atrial fibrillation ([place]) 08/31/2011  . Chronic anticoagulation 12/16/2010  . 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 Cerebrovascular accident ([place])   . DJD (degenerative joint disease)   . Hypertension 02/21/2009  . Sick sinus syndrome ([place]) 02/21/2009  . Hyperlipidemia 11/26/2008  . SEIZURE DISORDER 10/31/2008   [place] PT, DPT 10:31 AM, 05/23/17 [place] PT, DPT  [facility] [address] [place], [place], [address] Phone: [phone]   Fax:  [phone]  Name: [name] MRN: [id number] Date of Birth: 22-May-1927

## 2017-05-24 ENCOUNTER — Ambulatory Visit (HOSPITAL_COMMUNITY): Payer: Medicare HMO | Admitting: Physical Therapy

## 2017-05-25 ENCOUNTER — Ambulatory Visit (INDEPENDENT_AMBULATORY_CARE_PROVIDER_SITE_OTHER): Payer: Medicare HMO | Admitting: *Deleted

## 2017-05-25 ENCOUNTER — Other Ambulatory Visit: Payer: Self-pay | Admitting: Family Medicine

## 2017-05-25 DIAGNOSIS — I639 Cerebral infarction, unspecified: Secondary | ICD-10-CM

## 2017-05-25 DIAGNOSIS — I495 Sick sinus syndrome: Secondary | ICD-10-CM | POA: Diagnosis not present

## 2017-05-25 DIAGNOSIS — Z5181 Encounter for therapeutic drug level monitoring: Secondary | ICD-10-CM | POA: Diagnosis not present

## 2017-05-25 LAB — POCT INR: INR: 3.1

## 2017-05-26 ENCOUNTER — Ambulatory Visit (HOSPITAL_COMMUNITY): Payer: Medicare HMO | Admitting: Physical Therapy

## 2017-05-26 ENCOUNTER — Encounter (HOSPITAL_COMMUNITY): Payer: Self-pay | Admitting: Physical Therapy

## 2017-05-26 DIAGNOSIS — M5416 Radiculopathy, lumbar region: Secondary | ICD-10-CM | POA: Diagnosis not present

## 2017-05-26 DIAGNOSIS — R293 Abnormal posture: Secondary | ICD-10-CM

## 2017-05-26 DIAGNOSIS — R262 Difficulty in walking, not elsewhere classified: Secondary | ICD-10-CM

## 2017-05-26 NOTE — Therapy (Signed)
[place], [place], [address] Phone: [phone]   Fax:  [phone]  Physical Therapy Treatment  Patient Details  Name: [name] MRN: [id number] Date of Birth: July 13, 1926 Referring Provider: Sallee Lange    Encounter Date: 05/26/2017  PT End of Session - 05/26/17 0948    Visit Number  9    Number of Visits  9    Date for PT Re-Evaluation  05/25/17    Authorization Type  Aetna medicare     Authorization - Visit Number  9    Authorization - Number of Visits  9    PT Start Time  [phone]    PT Stop Time  0945    PT Time Calculation (min)  55 min    Activity Tolerance  Patient tolerated treatment well    Behavior During Therapy  [facility] for tasks assessed/performed       Past Medical History:  Diagnosis Date  . Anemia    H/H of 11.3/36.7 in 12/2008 ;normal MCV; normal CBC in 2011  . Angioedema 08/2006  . Atrial fibrillation (HCC)    persistant  . Blood transfusion   . 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 Chronic bronchitis   . Chronic renal insufficiency    baseline creatine 1.4; 1.04 in 03/2010  . DJD (degenerative joint disease)    hands, knees  . Gout   . Hyperlipidemia   . Hypertension    heart disease diastolic dysfunction ; QQ-76%; mild pulmonary edema in 2006;responded to diurectics ; LVH  . Kidney stones   . Mitral valve disease    Severe mitral annular calcification; mild to moderate stenosis-not clearly rheumatic  . Nephrolithiasis   . Pacemaker   . Retinal hemorrhage   . Seizure disorder ([place])   . Shortness of breath    only with exertion  . Stroke ([place])   . Tachycardia-bradycardia syndrome ([place])    with pauses and syncope;PPM implantation 10/2008,negative stress nuclear study  03/2005  . Vertigo     Past Surgical History:  Procedure Laterality Date  . CATARACT EXTRACTION W/PHACO  03/29/2011   Procedure: CATARACT EXTRACTION PHACO AND INTRAOCULAR LENS PLACEMENT (IOC);  Surgeon: Tonny Branch;  [facility];  Service: Ophthalmology;  Laterality: Right;  CDE 12.62  . INSERT / REPLACE / REMOVE PACEMAKER  11/2008   St.Jude DUAL chamber pacemaker 11/06/2008  . MEMBRANE PEEL Right 02/12/2014   Procedure: MEMBRANE PEEL;  Surgeon: [name], MD;  Location: [place];  Service: Ophthalmology;  Laterality: Right;  . PARS PLANA VITRECTOMY Right 02/12/2014   Procedure: PARS PLANA VITRECTOMY WITH 25 GAUGE;  Surgeon: [name], MD;  Location: [place];  Service: Ophthalmology;  Laterality: Right;  . TOTAL KNEE ARTHROPLASTY  04/2009   Right    There were no vitals filed for this visit.  Subjective Assessment - 05/26/17 0903    Subjective  Pt states that she has not really had any trouble with her back since she started coming to therapy .  She coughed all night long and feels like she might be getting sick     Pertinent History  OA, Rt TKR, HTN, A fib, CVA    Limitations  Standing;Walking;House hold activities    How long can you sit comfortably?  no problem     How long can you stand comfortably?  an hour was less than five minutes     How long can you walk comfortably?  using her walker pt is able to walk for at least 10-15 minutes was only walking short distances     Patient Stated Goals  my back to feel better.     Currently in Pain?  No/denies         [facility] PT Assessment - 05/26/17 0001      Assessment   Medical Diagnosis  B radiculopathy     Referring Provider  Nicki Reaper Luking     Onset Date/Surgical Date  03/25/17    Next MD Visit  not scheduled     Prior Therapy  A year ago she was not using a wallker and was planting her own flowers       Precautions   Precautions  None      Restrictions   Weight Bearing Restrictions  No      Balance Screen   Has the  patient fallen in the past 6 months  No    Has the patient had a decrease in activity level because of a fear of falling?   No    Is the patient reluctant to leave their home because of a fear of falling?   No      Home Environment   Living Environment  Private residence    Type of [place] to enter    Entrance Stairs-Number of Steps  5    Entrance Stairs-Rails  Right      Prior Function   Level of Independence  Independent with community mobility with device    Vocation  Retired    Leisure  cooking  not cooking like she was       Charity fundraiser Status  Within Functional Limits for tasks assessed      Observation/Other Assessments   Focus on Therapeutic Outcomes (FOTO)   52 was 44      Functional Tests   Functional tests  Single leg stance;Sit to Stand      Single Leg Stance   Comments  unable at this time without UE support       Sit to Stand   Comments  able to complete 5x without UE assist in 24.2; was 5 x with UE assist 31.46      Posture/Postural Control   Posture/Postural Control  Postural limitations    Postural Limitations  Rounded Shoulders;Forward head;Decreased lumbar lordosis;Increased thoracic kyphosis      Strength   Right Knee Flexion  4+/5 was 3+/5     Right Knee Extension  5/5 was 4-/5     Left Knee Flexion  4/5 was 4-    Left Knee Extension  5/5 was 5/5     Right Ankle Dorsiflexion  4/5 was 4/5     Left Ankle Dorsiflexion  4+/5 was 4/5      Ambulation/Gait   Ambulation Distance (Feet)  268 Feet was 226     Assistive device  Rolling walker    Ambulation Surface  Level    Gait velocity  --    Gait Comments  3'  Pt walks with 20 degrees forward flexion                   [facility] Adult PT Treatment/Exercise - 05/26/17 0001  Exercises   Exercises  Lumbar      Lumbar Exercises: Aerobic   Stationary Bike  -- Nustep level 2 hills 1 x 7:00 for reciprocal motion and stre      Lumbar Exercises:  Standing   Other Standing Lumbar Exercises  Single leg stance with one hand hold x 5 B       Lumbar Exercises: Seated   Other Seated Lumbar Exercises  scapular retraction x 10    Other Seated Lumbar Exercises  abdominal set              PT Education - 05/26/17 0946    Education provided  Yes    Education Details  PT given tennis ball for the back of her walker to allow walker to go smoother.  Pt educated that the reason she came to therapy, her back, has resolved.  But pt balance still remains poor and therapist feels she should stay on a wallker for this reason. Pt encouraged to go to her church's gym on a regular basis.     Person(s) Educated  Patient;Spouse    Methods  Explanation    Comprehension  Verbalized understanding       PT Short Term Goals - 05/26/17 0914      PT SHORT TERM GOAL #1   Title  Pt to be walking with only 10 degree forward bend to decrease stress on pt lower back.      Time  3    Period  Weeks    Status  Achieved      PT SHORT TERM GOAL #2   Title  Pt to be able to verbalize the importance of proper sitting posture for back care     Time  3    Period  Weeks    Status  Achieved      PT SHORT TERM GOAL #3   Title  Pt core and LE strength increased where pt states that she is not having any difficulty rolling in bed    Time  3    Period  Weeks    Status  Achieved      PT SHORT TERM GOAL #4   Title  PT to state that the furthest down her legs her pain goes is into her hips to demonstrate decreased nerve irritation     Time  3    Period  Weeks    Status  Achieved        PT Long Term Goals - 05/26/17 0914      PT LONG TERM GOAL #1   Title  Pt pain in low back to be no greater than a 6/10 to allow pt to ambulate 300 ft in 3 minutes to improve community ambulation     Time  6    Period  Weeks    Status  Achieved      PT LONG TERM GOAL #2   Title  Pt to be able to single leg stance on both feet for 5 seconds to reduce risk of falling      Time  6    Period  Weeks    Status  On-going      PT LONG TERM GOAL #3   Title  Pt to be able to walk with no forward bend at waist to reduce stress on pt lower back.     Time  6    Period  Weeks    Status  Partially Met  PT LONG TERM GOAL #4   Title  Pt to states that she has not had any radicular pain in either legs the past week     Time  6    Period  Weeks    Status  Achieved            Plan - 06/15/2017 0948    Clinical Impression Statement  Pt reassessed today.  The diagnosis that the pt was referred for, low back pain, has resolved.  The patient was encouraged to keep using her walker rather than her cane due to her balance still being poor.  Pt had been falling with her cane.  Pt understands that therapy could assist her with her balance but that would be another referral for a different reason.  We will discharge pt from her low back issue at this time.     Clinical Presentation  Stable    Rehab Potential  Good    PT Frequency  3x / week    PT Duration  6 weeks    PT Treatment/Interventions  ADLs/Self Care Home Management;Gait training;Functional mobility training;Therapeutic activities;Therapeutic exercise;Balance training;Neuromuscular re-education;Patient/family education;Manual techniques    PT Next Visit Plan  Discharge due to resolution of back pain     PT Home Exercise Plan  eval:  sitting scapular retraction, glut sets, ab sets and tall sititng     Consulted and Agree with Plan of Care  Patient       Patient will benefit from skilled therapeutic intervention in order to improve the following deficits and impairments:  Abnormal gait, Decreased activity tolerance, Decreased balance, Decreased range of motion, Decreased strength, Difficulty walking, Postural dysfunction, Improper body mechanics, Pain  Visit Diagnosis: Radiculopathy, lumbar region  Difficulty in walking, not elsewhere classified  Abnormal posture   G-Codes - 2017-06-15 0952    Functional  Limitation  Mobility: Walking and moving around    Mobility: Walking and Moving Around Goal Status [phone])  At least 40 percent but less than 60 percent impaired, limited or restricted    Mobility: Walking and Moving Around Discharge Status [phone])  At least 40 percent but less than 60 percent impaired, limited or restricted       Problem List Patient Active Problem List   Diagnosis Date Noted  . Controlled type 2 diabetes mellitus with complication, without long-term current use of insulin ([place]) 07/08/2016  . Prediabetes 12/25/2015  . 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 Vitreous hemorrhage ([place]) 02/12/2014  . TIA (transient ischemic attack) 08/20/2013  . Encounter for therapeutic drug monitoring 06/28/2013  . Gout 11/14/2012  . Fasting hyperglycemia 04/06/2012  . Atrial fibrillation ([place]) 08/31/2011  . Chronic anticoagulation 12/16/2010  . 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 Cerebrovascular accident ([place])   . DJD (degenerative joint disease)   . Hypertension 02/21/2009  . Sick sinus syndrome ([place]) 02/21/2009  . Hyperlipidemia 11/26/2008  . SEIZURE DISORDER 10/31/2008   Rayetta Humphrey, PT CLT [phone] 2017-06-15, 9:54 AM  [facility] [place] [address] [place], [place], [address] Phone: [phone]   Fax:  [phone]  Name: [name] MRN: [id number] Date of Birth: [birth year]  PHYSICAL THERAPY DISCHARGE SUMMARY  Visits from Start of Care: 9  Current functional level related to goals / functional outcomes: See above   Remaining deficits: See above    Education / Equipment: HEP Plan: Patient agrees to discharge.  Patient goals were partially met. Patient is being discharged due to meeting the stated rehab  goals.  ?????        Rayetta Humphrey, [place] CLT [phone]

## 2017-06-02 ENCOUNTER — Encounter (HOSPITAL_COMMUNITY): Payer: Medicare HMO | Admitting: Physical Therapy

## 2017-06-09 ENCOUNTER — Encounter (HOSPITAL_COMMUNITY): Payer: Medicare HMO

## 2017-06-11 ENCOUNTER — Other Ambulatory Visit: Payer: Self-pay | Admitting: Family Medicine

## 2017-06-11 ENCOUNTER — Other Ambulatory Visit: Payer: Self-pay | Admitting: Internal Medicine

## 2017-06-14 ENCOUNTER — Encounter (HOSPITAL_COMMUNITY): Payer: Medicare HMO | Admitting: Physical Therapy

## 2017-06-15 ENCOUNTER — Ambulatory Visit (INDEPENDENT_AMBULATORY_CARE_PROVIDER_SITE_OTHER): Payer: Medicare HMO | Admitting: *Deleted

## 2017-06-15 DIAGNOSIS — I495 Sick sinus syndrome: Secondary | ICD-10-CM

## 2017-06-15 DIAGNOSIS — I4891 Unspecified atrial fibrillation: Secondary | ICD-10-CM | POA: Diagnosis not present

## 2017-06-15 DIAGNOSIS — Z5181 Encounter for therapeutic drug level monitoring: Secondary | ICD-10-CM | POA: Diagnosis not present

## 2017-06-15 DIAGNOSIS — I639 Cerebral infarction, unspecified: Secondary | ICD-10-CM | POA: Diagnosis not present

## 2017-06-15 LAB — POCT INR: INR: 2.1

## 2017-06-15 NOTE — Patient Instructions (Signed)
Continue coumadin 1/2 tablet daily Recheck in 4 weeks  

## 2017-06-16 ENCOUNTER — Encounter (HOSPITAL_COMMUNITY): Payer: Medicare HMO | Admitting: Physical Therapy

## 2017-06-21 ENCOUNTER — Encounter (HOSPITAL_COMMUNITY): Payer: Medicare HMO | Admitting: Physical Therapy

## 2017-06-23 ENCOUNTER — Encounter (HOSPITAL_COMMUNITY): Payer: Medicare HMO | Admitting: Physical Therapy

## 2017-07-05 ENCOUNTER — Other Ambulatory Visit (HOSPITAL_COMMUNITY): Payer: Self-pay | Admitting: Family Medicine

## 2017-07-05 ENCOUNTER — Other Ambulatory Visit: Payer: Self-pay | Admitting: Internal Medicine

## 2017-07-11 ENCOUNTER — Telehealth: Payer: Self-pay | Admitting: Cardiology

## 2017-07-11 ENCOUNTER — Ambulatory Visit (INDEPENDENT_AMBULATORY_CARE_PROVIDER_SITE_OTHER): Payer: Medicare HMO | Admitting: *Deleted

## 2017-07-11 DIAGNOSIS — I495 Sick sinus syndrome: Secondary | ICD-10-CM | POA: Diagnosis not present

## 2017-07-11 NOTE — Telephone Encounter (Signed)
Spoke with pt and reminded pt of remote transmission that is due today. Pt verbalized understanding.   

## 2017-07-12 ENCOUNTER — Encounter: Payer: Self-pay | Admitting: Cardiology

## 2017-07-12 NOTE — Progress Notes (Signed)
Remote pacemaker transmission.   

## 2017-07-13 ENCOUNTER — Ambulatory Visit (INDEPENDENT_AMBULATORY_CARE_PROVIDER_SITE_OTHER): Payer: Medicare HMO | Admitting: *Deleted

## 2017-07-13 DIAGNOSIS — I639 Cerebral infarction, unspecified: Secondary | ICD-10-CM

## 2017-07-13 DIAGNOSIS — I4891 Unspecified atrial fibrillation: Secondary | ICD-10-CM

## 2017-07-13 DIAGNOSIS — Z5181 Encounter for therapeutic drug level monitoring: Secondary | ICD-10-CM

## 2017-07-13 DIAGNOSIS — I495 Sick sinus syndrome: Secondary | ICD-10-CM | POA: Diagnosis not present

## 2017-07-13 LAB — POCT INR: INR: 2.7

## 2017-07-13 NOTE — Patient Instructions (Signed)
Continue coumadin 1/2 tablet daily Recheck in 5 weeks 

## 2017-07-29 LAB — CUP PACEART REMOTE DEVICE CHECK
Battery Voltage: 2.84 V
Brady Statistic RV Percent Paced: 65 %
Implantable Lead Location: 753860
Lead Channel Impedance Value: 410 Ohm
Lead Channel Sensing Intrinsic Amplitude: 12 mV
Lead Channel Setting Pacing Pulse Width: 0.4 ms
MDC IDC LEAD IMPLANT DT: 20100602
MDC IDC LEAD IMPLANT DT: 20100602
MDC IDC LEAD LOCATION: 753859
MDC IDC MSMT BATTERY REMAINING LONGEVITY: 49 mo
MDC IDC MSMT BATTERY REMAINING PERCENTAGE: 40 %
MDC IDC MSMT LEADCHNL RV PACING THRESHOLD AMPLITUDE: 0.5 V
MDC IDC MSMT LEADCHNL RV PACING THRESHOLD PULSEWIDTH: 0.4 ms
MDC IDC PG IMPLANT DT: 20100602
MDC IDC SESS DTM: 20190204195310
MDC IDC SET LEADCHNL RV PACING AMPLITUDE: 2.5 V
MDC IDC SET LEADCHNL RV SENSING SENSITIVITY: 2 mV
Pulse Gen Serial Number: 2323973

## 2017-08-06 ENCOUNTER — Other Ambulatory Visit (HOSPITAL_COMMUNITY): Payer: Self-pay | Admitting: Family Medicine

## 2017-08-12 ENCOUNTER — Other Ambulatory Visit (HOSPITAL_COMMUNITY): Payer: Self-pay | Admitting: Family Medicine

## 2017-08-24 ENCOUNTER — Ambulatory Visit (INDEPENDENT_AMBULATORY_CARE_PROVIDER_SITE_OTHER): Payer: Medicare HMO | Admitting: *Deleted

## 2017-08-24 DIAGNOSIS — I639 Cerebral infarction, unspecified: Secondary | ICD-10-CM

## 2017-08-24 DIAGNOSIS — I4891 Unspecified atrial fibrillation: Secondary | ICD-10-CM

## 2017-08-24 DIAGNOSIS — Z5181 Encounter for therapeutic drug level monitoring: Secondary | ICD-10-CM | POA: Diagnosis not present

## 2017-08-24 DIAGNOSIS — I495 Sick sinus syndrome: Secondary | ICD-10-CM | POA: Diagnosis not present

## 2017-08-24 LAB — POCT INR: INR: 2.4

## 2017-08-24 NOTE — Patient Instructions (Signed)
Continue coumadin 1/2 tablet daily Recheck in 6 weeks 

## 2017-08-25 ENCOUNTER — Other Ambulatory Visit: Payer: Self-pay | Admitting: Family Medicine

## 2017-08-25 NOTE — Telephone Encounter (Signed)
scotts but we usually do not rx this

## 2017-09-14 ENCOUNTER — Ambulatory Visit (INDEPENDENT_AMBULATORY_CARE_PROVIDER_SITE_OTHER): Payer: Medicare HMO | Admitting: Family Medicine

## 2017-09-14 ENCOUNTER — Encounter: Payer: Self-pay | Admitting: Family Medicine

## 2017-09-14 VITALS — BP 126/76 | Ht 62.0 in | Wt 197.0 lb

## 2017-09-14 DIAGNOSIS — M5431 Sciatica, right side: Secondary | ICD-10-CM

## 2017-09-14 MED ORDER — PRAVASTATIN SODIUM 80 MG PO TABS: ORAL_TABLET | ORAL | 1 refills | Status: DC

## 2017-09-14 MED ORDER — FUROSEMIDE 20 MG PO TABS: ORAL_TABLET | ORAL | 5 refills | Status: DC

## 2017-09-14 MED ORDER — PREDNISONE 20 MG PO TABS: ORAL_TABLET | ORAL | 0 refills | Status: DC

## 2017-09-14 NOTE — Progress Notes (Signed)
   Subjective:    Patient ID: DENIM START, female    DOB: 1926-12-22, 84 y.o.   MRN: [id number]  HPI Patient arrives with flare of back pain for 2 days. Low back pain discomfort on the right lower back does not radiate into the abdomen radiates more toward the right hip in the groin hurts with certain movements.  Denies any abdominal pain denies nausea fever chills sweats denies rectal bleeding hematuria vomiting is on Coumadin.  Cannot tolerate tramadol.  Review of Systems Please see above no chest congestion drainage coughing denies vomiting diarrhea does relate some right low back and hip region pain    Objective:   Physical Exam Respiratory rate normal heart is regular pulses normal extremities no edema skin warm dry low back tenderness in the right lower back  Abdomen is soft on exam table straight leg raise on the right mild tenderness     Assessment & Plan:  Straight leg raise positive Short course prednisone Cannot do long-term because of warfarin Patient did not do well with tramadol therefore hold off on any type of controlled med but if this does not do well enough may need to try half a tablet of hydrocodone as necessary family will let us know if ongoing troubles may need additional x-rays but recently had x-rays  May need physical therapy

## 2017-09-20 ENCOUNTER — Encounter (HOSPITAL_COMMUNITY): Payer: Self-pay | Admitting: Emergency Medicine

## 2017-09-20 ENCOUNTER — Other Ambulatory Visit: Payer: Self-pay

## 2017-09-20 ENCOUNTER — Inpatient Hospital Stay (HOSPITAL_COMMUNITY)
Admission: EM | Admit: 2017-09-20 | Discharge: 2017-09-22 | DRG: 069 | Disposition: A | Discharge status: Home Health | Payer: Medicare HMO | Attending: Internal Medicine | Admitting: Internal Medicine

## 2017-09-20 ENCOUNTER — Emergency Department (HOSPITAL_COMMUNITY): Payer: Medicare HMO

## 2017-09-20 ENCOUNTER — Inpatient Hospital Stay (HOSPITAL_COMMUNITY): Payer: Medicare HMO

## 2017-09-20 DIAGNOSIS — I081 Rheumatic disorders of both mitral and tricuspid valves: Secondary | ICD-10-CM | POA: Diagnosis present

## 2017-09-20 DIAGNOSIS — I4891 Unspecified atrial fibrillation: Secondary | ICD-10-CM | POA: Diagnosis present

## 2017-09-20 DIAGNOSIS — I69351 Hemiplegia and hemiparesis following cerebral infarction affecting right dominant side: Secondary | ICD-10-CM

## 2017-09-20 DIAGNOSIS — E042 Nontoxic multinodular goiter: Secondary | ICD-10-CM | POA: Diagnosis present

## 2017-09-20 DIAGNOSIS — I69398 Other sequelae of cerebral infarction: Secondary | ICD-10-CM | POA: Diagnosis not present

## 2017-09-20 DIAGNOSIS — I482 Chronic atrial fibrillation: Secondary | ICD-10-CM | POA: Diagnosis not present

## 2017-09-20 DIAGNOSIS — I10 Essential (primary) hypertension: Secondary | ICD-10-CM | POA: Diagnosis present

## 2017-09-20 DIAGNOSIS — G9340 Encephalopathy, unspecified: Secondary | ICD-10-CM

## 2017-09-20 DIAGNOSIS — R9401 Abnormal electroencephalogram [EEG]: Secondary | ICD-10-CM | POA: Diagnosis present

## 2017-09-20 DIAGNOSIS — R531 Weakness: Secondary | ICD-10-CM

## 2017-09-20 DIAGNOSIS — Z961 Presence of intraocular lens: Secondary | ICD-10-CM | POA: Diagnosis present

## 2017-09-20 DIAGNOSIS — Z9841 Cataract extraction status, right eye: Secondary | ICD-10-CM | POA: Diagnosis not present

## 2017-09-20 DIAGNOSIS — Z79899 Other long term (current) drug therapy: Secondary | ICD-10-CM

## 2017-09-20 DIAGNOSIS — H5461 Unqualified visual loss, right eye, normal vision left eye: Secondary | ICD-10-CM | POA: Diagnosis present

## 2017-09-20 DIAGNOSIS — I481 Persistent atrial fibrillation: Secondary | ICD-10-CM | POA: Diagnosis present

## 2017-09-20 DIAGNOSIS — R402441 Other coma, without documented Glasgow coma scale score, or with partial score reported, in the field [EMT or ambulance]: Secondary | ICD-10-CM | POA: Diagnosis not present

## 2017-09-20 DIAGNOSIS — G459 Transient cerebral ischemic attack, unspecified: Principal | ICD-10-CM | POA: Diagnosis present

## 2017-09-20 DIAGNOSIS — E7849 Other hyperlipidemia: Secondary | ICD-10-CM | POA: Diagnosis not present

## 2017-09-20 DIAGNOSIS — Z87442 Personal history of urinary calculi: Secondary | ICD-10-CM

## 2017-09-20 DIAGNOSIS — M109 Gout, unspecified: Secondary | ICD-10-CM | POA: Diagnosis present

## 2017-09-20 DIAGNOSIS — M19041 Primary osteoarthritis, right hand: Secondary | ICD-10-CM | POA: Diagnosis present

## 2017-09-20 DIAGNOSIS — Z885 Allergy status to narcotic agent status: Secondary | ICD-10-CM | POA: Diagnosis not present

## 2017-09-20 DIAGNOSIS — Z96651 Presence of right artificial knee joint: Secondary | ICD-10-CM | POA: Diagnosis present

## 2017-09-20 DIAGNOSIS — E1165 Type 2 diabetes mellitus with hyperglycemia: Secondary | ICD-10-CM | POA: Diagnosis present

## 2017-09-20 DIAGNOSIS — Z950 Presence of cardiac pacemaker: Secondary | ICD-10-CM | POA: Diagnosis not present

## 2017-09-20 DIAGNOSIS — R55 Syncope and collapse: Secondary | ICD-10-CM

## 2017-09-20 DIAGNOSIS — I700 Atherosclerosis of aorta: Secondary | ICD-10-CM | POA: Diagnosis present

## 2017-09-20 DIAGNOSIS — N183 Chronic kidney disease, stage 3 (moderate): Secondary | ICD-10-CM | POA: Diagnosis present

## 2017-09-20 DIAGNOSIS — R29810 Facial weakness: Secondary | ICD-10-CM | POA: Diagnosis present

## 2017-09-20 DIAGNOSIS — G40909 Epilepsy, unspecified, not intractable, without status epilepticus: Secondary | ICD-10-CM | POA: Diagnosis present

## 2017-09-20 DIAGNOSIS — Z7901 Long term (current) use of anticoagulants: Secondary | ICD-10-CM

## 2017-09-20 DIAGNOSIS — R29818 Other symptoms and signs involving the nervous system: Secondary | ICD-10-CM | POA: Diagnosis not present

## 2017-09-20 DIAGNOSIS — I129 Hypertensive chronic kidney disease with stage 1 through stage 4 chronic kidney disease, or unspecified chronic kidney disease: Secondary | ICD-10-CM | POA: Diagnosis present

## 2017-09-20 DIAGNOSIS — M6281 Muscle weakness (generalized): Secondary | ICD-10-CM | POA: Diagnosis not present

## 2017-09-20 DIAGNOSIS — R791 Abnormal coagulation profile: Secondary | ICD-10-CM | POA: Diagnosis present

## 2017-09-20 DIAGNOSIS — E782 Mixed hyperlipidemia: Secondary | ICD-10-CM | POA: Diagnosis present

## 2017-09-20 DIAGNOSIS — Z7952 Long term (current) use of systemic steroids: Secondary | ICD-10-CM

## 2017-09-20 DIAGNOSIS — I639 Cerebral infarction, unspecified: Secondary | ICD-10-CM | POA: Diagnosis present

## 2017-09-20 DIAGNOSIS — E785 Hyperlipidemia, unspecified: Secondary | ICD-10-CM | POA: Diagnosis present

## 2017-09-20 DIAGNOSIS — D649 Anemia, unspecified: Secondary | ICD-10-CM | POA: Diagnosis present

## 2017-09-20 DIAGNOSIS — M19042 Primary osteoarthritis, left hand: Secondary | ICD-10-CM | POA: Diagnosis present

## 2017-09-20 DIAGNOSIS — I6523 Occlusion and stenosis of bilateral carotid arteries: Secondary | ICD-10-CM | POA: Diagnosis not present

## 2017-09-20 DIAGNOSIS — M5416 Radiculopathy, lumbar region: Secondary | ICD-10-CM | POA: Diagnosis present

## 2017-09-20 DIAGNOSIS — I361 Nonrheumatic tricuspid (valve) insufficiency: Secondary | ICD-10-CM | POA: Diagnosis not present

## 2017-09-20 DIAGNOSIS — M79609 Pain in unspecified limb: Secondary | ICD-10-CM | POA: Diagnosis not present

## 2017-09-20 DIAGNOSIS — R42 Dizziness and giddiness: Secondary | ICD-10-CM | POA: Diagnosis not present

## 2017-09-20 LAB — COMPREHENSIVE METABOLIC PANEL
ALK PHOS: 69 U/L (ref 38–126)
ALT: 20 U/L (ref 14–54)
AST: 26 U/L (ref 15–41)
Albumin: 3.6 g/dL (ref 3.5–5.0)
Anion gap: 11 (ref 5–15)
BILIRUBIN TOTAL: 1 mg/dL (ref 0.3–1.2)
BUN: 28 mg/dL — AB (ref 6–20)
CALCIUM: 10.1 mg/dL (ref 8.9–10.3)
CO2: 22 mmol/L (ref 22–32)
CREATININE: 1.47 mg/dL — AB (ref 0.44–1.00)
Chloride: 105 mmol/L (ref 101–111)
GFR, EST AFRICAN AMERICAN: 36 mL/min — AB (ref >60)
GFR, EST NON AFRICAN AMERICAN: 31 mL/min — AB (ref >60)
Glucose, Bld: 144 mg/dL — ABNORMAL HIGH (ref 65–99)
Potassium: 4.2 mmol/L (ref 3.5–5.1)
Sodium: 138 mmol/L (ref 135–145)
TOTAL PROTEIN: 6.6 g/dL (ref 6.5–8.1)

## 2017-09-20 LAB — CBC
HEMATOCRIT: 45.3 % (ref 36.0–46.0)
HEMOGLOBIN: 14.7 g/dL (ref 12.0–15.0)
MCH: 29.5 pg (ref 26.0–34.0)
MCHC: 32.5 g/dL (ref 30.0–36.0)
MCV: 90.8 fL (ref 78.0–100.0)
Platelets: 216 10*3/uL (ref 150–400)
RBC: 4.99 MIL/uL (ref 3.87–5.11)
RDW: 13.5 % (ref 11.5–15.5)
WBC: 11.8 10*3/uL — AB (ref 4.0–10.5)

## 2017-09-20 LAB — DIFFERENTIAL
BASOS ABS: 0 10*3/uL (ref 0.0–0.1)
BASOS PCT: 0 %
Eosinophils Absolute: 0.1 10*3/uL (ref 0.0–0.7)
Eosinophils Relative: 0 %
LYMPHS ABS: 4 10*3/uL (ref 0.7–4.0)
Lymphocytes Relative: 34 %
MONO ABS: 0.9 10*3/uL (ref 0.1–1.0)
MONOS PCT: 8 %
NEUTROS ABS: 6.8 10*3/uL (ref 1.7–7.7)
Neutrophils Relative %: 58 %

## 2017-09-20 LAB — I-STAT CHEM 8, ED
BUN: 30 mg/dL — AB (ref 6–20)
CALCIUM ION: 1.29 mmol/L (ref 1.15–1.40)
CHLORIDE: 102 mmol/L (ref 101–111)
Creatinine, Ser: 1.4 mg/dL — ABNORMAL HIGH (ref 0.44–1.00)
GLUCOSE: 152 mg/dL — AB (ref 65–99)
HCT: 46 % (ref 36.0–46.0)
Hemoglobin: 15.6 g/dL — ABNORMAL HIGH (ref 12.0–15.0)
Potassium: 4 mmol/L (ref 3.5–5.1)
Sodium: 140 mmol/L (ref 135–145)
TCO2: 28 mmol/L (ref 22–32)

## 2017-09-20 LAB — PROTIME-INR
INR: 3.7
Prothrombin Time: 36.4 seconds — ABNORMAL HIGH (ref 11.4–15.2)

## 2017-09-20 LAB — APTT: aPTT: 28 seconds (ref 24–36)

## 2017-09-20 LAB — I-STAT TROPONIN, ED: TROPONIN I, POC: 0.02 ng/mL (ref 0.00–0.08)

## 2017-09-20 LAB — CBG MONITORING, ED: Glucose-Capillary: 144 mg/dL — ABNORMAL HIGH (ref 65–99)

## 2017-09-20 MED ORDER — ALBUTEROL SULFATE (2.5 MG/3ML) 0.083% IN NEBU: 3.0000 mL | INHALATION_SOLUTION | Freq: Four times a day (QID) | RESPIRATORY_TRACT | Status: DC | PRN

## 2017-09-20 MED ORDER — ACETAMINOPHEN 650 MG RE SUPP: 650.0000 mg | RECTAL | Status: DC | PRN

## 2017-09-20 MED ORDER — STROKE: EARLY STAGES OF RECOVERY BOOK
Freq: Once | Status: AC
  Administered 2017-09-20
  Filled 2017-09-20: qty 1

## 2017-09-20 MED ORDER — PRAVASTATIN SODIUM 40 MG PO TABS
80.0000 mg | ORAL_TABLET | Freq: Every day | ORAL | Status: DC
  Administered 2017-09-20 – 2017-09-21 (×2): 80 mg via ORAL
  Filled 2017-09-20 (×2): qty 2

## 2017-09-20 MED ORDER — ACETAMINOPHEN 160 MG/5ML PO SOLN: 650.0000 mg | ORAL | Status: DC | PRN

## 2017-09-20 MED ORDER — SENNOSIDES-DOCUSATE SODIUM 8.6-50 MG PO TABS
1.0000 | ORAL_TABLET | Freq: Every evening | ORAL | Status: DC | PRN
Start: 2017-09-20 — End: 2017-09-22

## 2017-09-20 MED ORDER — DORZOLAMIDE HCL-TIMOLOL MAL 2-0.5 % OP SOLN
1.0000 [drp] | Freq: Every day | OPHTHALMIC | Status: DC
  Administered 2017-09-20 – 2017-09-21 (×2): 1 [drp] via OPHTHALMIC
  Filled 2017-09-20: qty 10

## 2017-09-20 MED ORDER — DILTIAZEM HCL ER BEADS 240 MG PO CP24
360.0000 mg | ORAL_CAPSULE | Freq: Every day | ORAL | Status: DC
  Filled 2017-09-20: qty 1

## 2017-09-20 MED ORDER — ALLOPURINOL 100 MG PO TABS
100.0000 mg | ORAL_TABLET | ORAL | Status: DC
  Administered 2017-09-21: 100 mg via ORAL
  Filled 2017-09-20: qty 1

## 2017-09-20 MED ORDER — IOPAMIDOL (ISOVUE-370) INJECTION 76%
INTRAVENOUS | Status: AC
  Administered 2017-09-21: 01:00:00 via INTRAVENOUS
  Filled 2017-09-20: qty 100

## 2017-09-20 MED ORDER — ACETAMINOPHEN 325 MG PO TABS: 650.0000 mg | ORAL_TABLET | ORAL | Status: DC | PRN

## 2017-09-20 MED ORDER — IOPAMIDOL (ISOVUE-370) INJECTION 76%
100.0000 mL | Freq: Once | INTRAVENOUS | Status: AC | PRN
  Administered 2017-09-20: 50 mL via INTRAVENOUS

## 2017-09-20 MED ORDER — FAMOTIDINE 20 MG PO TABS
20.0000 mg | ORAL_TABLET | Freq: Two times a day (BID) | ORAL | Status: DC
  Administered 2017-09-20 – 2017-09-22 (×4): 20 mg via ORAL
  Filled 2017-09-20 (×4): qty 1

## 2017-09-20 NOTE — ED Provider Notes (Signed)
[facility] Provider Note   CSN: [id number] Arrival date & time: 09/20/17  1631     History   Chief Complaint No chief complaint on file.   HPI [name] is a 84 y.o. female.  HPI  56:19 PM 84 year old female seen on arrival at the bridge as a code stroke.  Last known normal reported at 330.  She was in the kitchen when she suddenly felt something funny in the pit of her stomach and became lightheaded and dizzy.  Per EMS report she was noted to have some right facial droop.  There is a question of seizure in the past, but no report of seizure today.  EMS reports prehospital fingerstick blood sugar as 141.  She has been awake and alert and route per EMS.  It is reported that she is on Coumadin and has some headache currently.  At bridge, evaluated by myself and Dr. Malen Gauze.  Dr. Malen Gauze states that she has 1.4 right leg drift and flattening of right nasolabial fold.  Does not think she is a candidate for TPA due to low severity and being on Coumadin Repeat evaluation after initial stroke evaluation at bridge.  Patient states that she was in the yard and she had a sharp pains in the back of the right knee.  She then felt some pressure in her epigastrium and got very lightheaded and had a syncopal episode.  She states she did not hurt herself as her son caught her.  She had some headache at that time.  There is no history that she had any seizure type activity or had a prolonged or had a post ictal phase.  She currently is not having any pain in her leg, headache, or lightheadedness. Past Medical History:  Diagnosis Date  . Anemia    H/H of 11.3/36.7 in 12/2008 ;normal MCV; normal CBC in 2011  . Angioedema 08/2006  . Atrial fibrillation (HCC)    persistant  . Blood transfusion   . 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 Chronic bronchitis   . Chronic renal insufficiency    baseline creatine 1.4; 1.04 in 03/2010  . DJD (degenerative joint disease)    hands, knees  . Gout   . Hyperlipidemia   . Hypertension    heart disease diastolic dysfunction ; WS-56%; mild pulmonary edema in 2006;responded to diurectics ; LVH  . Kidney stones   . Mitral valve disease    Severe mitral annular calcification; mild to moderate stenosis-not clearly rheumatic  . Nephrolithiasis   . Pacemaker   . Retinal hemorrhage   . Seizure disorder ([place])   . Shortness of breath    only with exertion  . Stroke ([place])   . Tachycardia-bradycardia syndrome ([place])    with pauses and syncope;PPM implantation 10/2008,negative stress nuclear study 03/2005  . Vertigo     Patient Active Problem List   Diagnosis Date Noted  . Controlled type 2 diabetes mellitus with complication, without long-term current use of insulin ([place]) 07/08/2016  . Prediabetes 12/25/2015  . 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 Vitreous hemorrhage ([place]) 02/12/2014  . TIA (transient ischemic attack) 08/20/2013  . Encounter for therapeutic drug monitoring 06/28/2013  . Gout 11/14/2012  . Fasting hyperglycemia 04/06/2012  . Atrial fibrillation ([place]) 08/31/2011  .  Chronic anticoagulation 12/16/2010  . 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 Cerebrovascular accident ([place])   . DJD (degenerative joint disease)   . Hypertension 02/21/2009  . Sick sinus syndrome ([place]) 02/21/2009  . Hyperlipidemia 11/26/2008  . SEIZURE DISORDER 10/31/2008    Past Surgical History:  Procedure Laterality Date  . CATARACT EXTRACTION W/PHACO  03/29/2011   Procedure: CATARACT EXTRACTION PHACO AND INTRAOCULAR LENS PLACEMENT (IOC);  Surgeon: Tonny Branch;  Location: [facility];  Service: Ophthalmology;  Laterality: Right;  CDE 12.62  . INSERT / REPLACE / REMOVE PACEMAKER  11/2008   St.Jude DUAL chamber pacemaker  11/06/2008  . MEMBRANE PEEL Right 02/12/2014   Procedure: MEMBRANE PEEL;  Surgeon: [name], MD;  Location: [facility];  Service: Ophthalmology;  Laterality: Right;  . PARS PLANA VITRECTOMY Right 02/12/2014   Procedure: PARS PLANA VITRECTOMY WITH 25 GAUGE;  Surgeon: [name], MD;  Location: [facility];  Service: Ophthalmology;  Laterality: Right;  . TOTAL KNEE ARTHROPLASTY  04/2009   Right     OB History    Gravida  5   Para  5   Term  5   Preterm      AB      Living  5     SAB      TAB      Ectopic      Multiple      Live Births               Home Medications    Prior to Admission medications   Medication Sig Start Date End Date Taking? Authorizing Provider  acetaminophen (TYLENOL) 325 MG tablet Take 325 mg by mouth every 6 (six) hours as needed for moderate pain.    [provider]  allopurinol (ZYLOPRIM) 100 MG tablet TAKE 1 TABLET BY MOUTH EVERY MONDAY, WEDNESDAY, AND FRIDAY. 07/06/17   Kathyrn Drown, MD  diltiazem (TIAZAC) 360 MG 24 hr capsule TAKE ONE CAPSULE BY MOUTH ONCE DAILY. 11/16/16   Evans Lance, MD  dorzolamide-timolol (COSOPT) 22.3-6.8 MG/ML ophthalmic solution Place 1 drop into both eyes at bedtime.     [provider]  furosemide (LASIX) 20 MG tablet TAKE (1) TABLET BY MOUTH ONCE DAILY. 09/14/17   Kathyrn Drown, MD  lisinopril (PRINIVIL,ZESTRIL) 20 MG tablet TAKE ONE TABLET BY MOUTH ONCE DAILY. 10/26/16   Evans Lance, MD  lisinopril (PRINIVIL,ZESTRIL) 20 MG tablet TAKE (1) TABLET BY MOUTH ONCE DAILY. 07/06/17   Evans Lance, MD  meclizine (ANTIVERT) 25 MG tablet Take 25 mg by mouth 3 (three) times daily as needed. Dizziness 11/11/10   Imogene Burn, PA-C  pravastatin (PRAVACHOL) 80 MG tablet TAKE (1) TABLET BY MOUTH AT BEDTIME. 09/14/17   Kathyrn Drown, MD  predniSONE (DELTASONE) 20 MG tablet 2 qd for 5 d 09/14/17   Kathyrn Drown, MD  ranitidine (ZANTAC) 150 MG tablet TAKE (1) TABLET BY MOUTH TWICE DAILY. 07/06/17    Kathyrn Drown, MD  VENTOLIN HFA 108 (90 Base) MCG/ACT inhaler TAKE 2 PUFFS EVERY 6 HOURS AS NEEDED 07/13/16   Kathyrn Drown, MD  warfarin (COUMADIN) 5 MG tablet TAKE 1/2 TO 1 TABLET BY MOUTH DAILY OR AS DIRECTED. 06/13/17   Evans Lance, MD  colchicine 0.6 MG tablet Take 0.6 mg by mouth daily as needed. Gout Flare Ups  08/31/11  [provider]  diltiazem (CARDIZEM CD) 360 MG 24 hr capsule TAKE (1) CAPSULE BY MOUTH ONCE DAILY. 08/12/11 08/31/11  Yehuda Savannah, MD  simvastatin (ZOCOR) 40 MG tablet Take 40 mg by mouth at bedtime.    08/31/11  [provider]    Family History Family History  Problem Relation Age of Onset  . Cancer Other        unknown cancer  . Hypotension Neg Hx   . Anesthesia problems Neg Hx   . Pseudochol deficiency Neg Hx   . Malignant hyperthermia Neg Hx     Social History Social History   Tobacco Use  . Smoking status: Never Smoker  . Smokeless tobacco: Never Used  Substance Use Topics  . Alcohol use: No    Alcohol/week: 0.0 oz  . Drug use: No     Allergies   Tramadol   Review of Systems Review of Systems  Constitutional: Negative.   HENT: Negative.   Eyes: Negative.   Respiratory: Positive for chest tightness.   Cardiovascular: Negative.   Gastrointestinal: Positive for abdominal pain.  Endocrine: Negative.   Genitourinary: Negative.   Musculoskeletal: Positive for arthralgias.  Skin: Negative.   Allergic/Immunologic: Negative.   Neurological: Positive for syncope and weakness.  Psychiatric/Behavioral: Negative.   All other systems reviewed and are negative.    Physical Exam Updated Vital Signs There were no vitals taken for this visit.  Physical Exam  Constitutional: She appears well-developed and well-nourished. No distress.  HENT:  Head: Normocephalic and atraumatic.  Right Ear: External ear normal.  Left Ear: External ear normal.  Mouth/Throat: Oropharynx is clear and moist.  Flattening right nasolabial  fold  Eyes: Pupils are equal, round, and reactive to light. EOM are normal.  Neck: Normal range of motion.  Pulmonary/Chest: Effort normal and breath sounds normal.  Abdominal: Soft.  Musculoskeletal: Normal range of motion.  Neurological: She is alert. She displays normal reflexes. No cranial nerve deficit. Coordination normal.  Oriented to person place and year but wrong month  Skin: Skin is warm. Capillary refill takes less than 2 seconds.  Psychiatric: She has a normal mood and affect.  Nursing note and vitals reviewed.    ED Treatments / Results  Labs (all labs ordered are listed, but only abnormal results are displayed) Labs Reviewed  CBG MONITORING, ED - Abnormal; Notable for the following components:      Result Value   Glucose-Capillary 144 (*)    All other components within normal limits  PROTIME-INR  APTT  CBC  DIFFERENTIAL  COMPREHENSIVE METABOLIC PANEL  I-STAT TROPONIN, ED  I-STAT CHEM 8, ED    EKG EKG Interpretation  Date/Time:  Tuesday September 20 2017 17:01:53 EDT Ventricular Rate:  70 PR Interval:    QRS Duration: 103 QT Interval:  430 QTC Calculation: 481 R Axis:   -15 Text Interpretation:  Atrial fibrillation Borderline left axis deviation Anterior infarct, old st depression t wave inversion III and avF appear new from first prior of 01/20/14 Confirmed by Pattricia Boss [phone]) on 09/20/2017 5:51:31 PM   Radiology Ct Angio Head W Or Wo Contrast  Result Date: 09/20/2017 CLINICAL DATA:  Right-sided weakness EXAM: CT ANGIOGRAPHY HEAD AND NECK TECHNIQUE: Multidetector CT imaging of the head and neck was performed using the standard protocol during bolus administration of intravenous contrast. Multiplanar CT image reconstructions and MIPs were obtained to evaluate the vascular anatomy. Carotid stenosis measurements (when applicable) are obtained utilizing NASCET criteria, using the distal internal carotid diameter as the denominator. CONTRAST:  21mL ISOVUE-370  IOPAMIDOL (ISOVUE-370) INJECTION 76% COMPARISON:  Head CT 09/20/2017 FINDINGS: CTA NECK FINDINGS  AORTIC ARCH: There is moderate calcific atherosclerosis of the aortic arch. There is no aneurysm, dissection or hemodynamically significant stenosis of the visualized ascending aorta and aortic arch. Conventional 3 vessel aortic branching pattern. Moderate atherosclerosis of the proximal subclavian arteries without flow-limiting stenosis. RIGHT CAROTID SYSTEM: --Common carotid artery: Widely patent origin. Moderate mixed density atherosclerosis without hemodynamically significant stenosis. --Internal carotid artery: Predominantly calcified plaque at the proximal right ICA with less than 50% stenosis. Partially retropharyngeal course. --External carotid artery: Severe stenosis of the proximal right ECA due to predominantly calcified plaque. LEFT CAROTID SYSTEM: --Common carotid artery: Widely patent origin without common carotid artery dissection or aneurysm. --Internal carotid artery:No dissection, occlusion or aneurysm. Mild atherosclerotic calcification at the carotid bifurcation without hemodynamically significant stenosis. --External carotid artery: Moderate stenosis proximally due to calcific atherosclerosis. VERTEBRAL ARTERIES: Left dominant configuration. The right vertebral artery is occluded at its origin. This is age indeterminate, but new compared to 08/20/2013. There is calcific gas pleural sclerosis at the origin of the left vertebral artery, which is otherwise widely patent to the vertebrobasilar confluence. There is opacification of the distal V3 and V4 segments of the right vertebral artery, likely via collateral flow across the PICAs. SKELETON: Multilevel degenerative disc disease and facet hypertrophy without bony spinal canal stenosis. OTHER NECK: Near complete opacification of the right maxillary sinus. Bilateral hypodense thyroid nodules measuring up to 2.1 cm. UPPER CHEST: No pneumothorax or pleural  effusion. No nodules or masses. CTA HEAD FINDINGS ANTERIOR CIRCULATION: --Intracranial internal carotid arteries: Normal. --Anterior cerebral arteries: Normal. Both A1 segments are present. --Middle cerebral arteries: Normal. --Posterior communicating arteries: Present bilaterally. POSTERIOR CIRCULATION: --Basilar artery: Normal. --Posterior cerebral arteries: Normal. --Superior cerebellar arteries: Normal. --Inferior cerebellar arteries: Normal anterior and posterior inferior cerebellar arteries. VENOUS SINUSES: As permitted by contrast timing, patent. ANATOMIC VARIANTS: Fetal origins of the posterior cerebellar arteries. DELAYED PHASE: Not performed. Review of the MIP images confirms the above findings. IMPRESSION: 1. No intracranial arterial occlusion or hemodynamically significant stenosis. 2. 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 The V4 segment is opacified via collateral flow. 3. Atherosclerotic calcification at both carotid bifurcations causing less than 50% stenosis of the proximal internal carotid arteries. There is moderate-to-severe stenosis of the proximal external carotid arteries. 4.  Aortic Atherosclerosis (ICD10-I70.0). 5. Thyroid nodules measuring up to 2.1 cm. If not previously obtained, nonemergent thyroid ultrasound is recommended for further assessment. These results were called by telephone at the time of interpretation on 09/20/2017 at 5:13 pm to Dr. Amie Portland , who verbally acknowledged these results. Electronically Signed   By: Ulyses Jarred M.D.   On: 09/20/2017 17:13   Ct Angio Neck W Or Wo Contrast  Result Date: 09/20/2017 CLINICAL DATA:  Right-sided weakness EXAM: CT ANGIOGRAPHY HEAD AND NECK TECHNIQUE: Multidetector CT imaging of the head and neck was performed using the standard protocol during bolus administration of intravenous contrast. Multiplanar CT image reconstructions and MIPs  were obtained to evaluate the vascular anatomy. Carotid stenosis measurements (when applicable) are obtained utilizing NASCET criteria, using the distal internal carotid diameter as the denominator. CONTRAST:  34mL ISOVUE-370 IOPAMIDOL (ISOVUE-370) INJECTION 76% COMPARISON:  Head CT 09/20/2017 FINDINGS: CTA NECK FINDINGS AORTIC ARCH: There is moderate calcific atherosclerosis of the aortic arch. There is no aneurysm, dissection or hemodynamically significant stenosis of the visualized ascending aorta and aortic arch. Conventional 3 vessel aortic branching pattern. Moderate atherosclerosis of the proximal  subclavian arteries without flow-limiting stenosis. RIGHT CAROTID SYSTEM: --Common carotid artery: Widely patent origin. Moderate mixed density atherosclerosis without hemodynamically significant stenosis. --Internal carotid artery: Predominantly calcified plaque at the proximal right ICA with less than 50% stenosis. Partially retropharyngeal course. --External carotid artery: Severe stenosis of the proximal right ECA due to predominantly calcified plaque. LEFT CAROTID SYSTEM: --Common carotid artery: Widely patent origin without common carotid artery dissection or aneurysm. --Internal carotid artery:No dissection, occlusion or aneurysm. Mild atherosclerotic calcification at the carotid bifurcation without hemodynamically significant stenosis. --External carotid artery: Moderate stenosis proximally due to calcific atherosclerosis. VERTEBRAL ARTERIES: Left dominant configuration. The right vertebral artery is occluded at its origin. This is age indeterminate, but new compared to 08/20/2013. There is calcific gas pleural sclerosis at the origin of the left vertebral artery, which is otherwise widely patent to the vertebrobasilar confluence. There is opacification of the distal V3 and V4 segments of the right vertebral artery, likely via collateral flow across the PICAs. SKELETON: Multilevel degenerative disc disease  and facet hypertrophy without bony spinal canal stenosis. OTHER NECK: Near complete opacification of the right maxillary sinus. Bilateral hypodense thyroid nodules measuring up to 2.1 cm. UPPER CHEST: No pneumothorax or pleural effusion. No nodules or masses. CTA HEAD FINDINGS ANTERIOR CIRCULATION: --Intracranial internal carotid arteries: Normal. --Anterior cerebral arteries: Normal. Both A1 segments are present. --Middle cerebral arteries: Normal. --Posterior communicating arteries: Present bilaterally. POSTERIOR CIRCULATION: --Basilar artery: Normal. --Posterior cerebral arteries: Normal. --Superior cerebellar arteries: Normal. --Inferior cerebellar arteries: Normal anterior and posterior inferior cerebellar arteries. VENOUS SINUSES: As permitted by contrast timing, patent. ANATOMIC VARIANTS: Fetal origins of the posterior cerebellar arteries. DELAYED PHASE: Not performed. Review of the MIP images confirms the above findings. IMPRESSION: 1. No intracranial arterial occlusion or hemodynamically significant stenosis. 2. 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 The V4 segment is opacified via collateral flow. 3. Atherosclerotic calcification at both carotid bifurcations causing less than 50% stenosis of the proximal internal carotid arteries. There is moderate-to-severe stenosis of the proximal external carotid arteries. 4.  Aortic Atherosclerosis (ICD10-I70.0). 5. Thyroid nodules measuring up to 2.1 cm. If not previously obtained, nonemergent thyroid ultrasound is recommended for further assessment. These results were called by telephone at the time of interpretation on 09/20/2017 at 5:13 pm to Dr. Amie Portland , who verbally acknowledged these results. Electronically Signed   By: Ulyses Jarred M.D.   On: 09/20/2017 17:13   Ct Head Code Stroke Wo Contrast  Result Date: 09/20/2017 CLINICAL DATA:  Code stroke.  Right  facial droop and leg weakness. EXAM: CT HEAD WITHOUT CONTRAST TECHNIQUE: Contiguous axial images were obtained from the base of the skull through the vertex without intravenous contrast. COMPARISON:  12/08/2015 FINDINGS: Brain: No convincing acute infarction. Indistinct left putamen is similar to prior and fairly symmetric. No visible acute cortical infarct. No hemorrhage, hydrocephalus, or masslike finding. There is advanced chronic small vessel ischemia with extensive gliosis in the cerebral white matter. Remote small vessel infarcts in the bilateral cerebellum, bilateral thalamus, left caudate head, and bilateral deep white matter tracts. Vascular: Atherosclerotic calcification. Skull: No acute or aggressive finding. Sinuses/Orbits: Negative Other: These results were communicated to Dr. Rory Percy at 4:49 pmon 4/16/2019by text page via the [facility] messaging system. ASPECTS [facility] Stroke Program Early CT Score) - Ganglionic level infarction (caudate, lentiform nuclei, internal capsule, insula, M1-M3 cortex): 7 - Supraganglionic infarction (M4-M6 cortex): 3 Total score (0-10 with 10  being normal): 10 IMPRESSION: 1. No acute hemorrhage. No definite acute infarct when compared to prior. 2. Advanced chronic small vessel disease with numerous remote small vessel infarcts. Infarcts have become more numerous in the bilateral cerebellum when compared to 2017. Electronically Signed   By: Monte Fantasia M.D.   On: 09/20/2017 16:52  Radiology studies reviewed  Procedures Procedures (including critical care time)  Medications Ordered in ED Medications - No data to display   Initial Impression / Assessment and Plan / ED Course  I have reviewed the triage vital signs and the nursing notes.  Pertinent labs & imaging results that were available during my care of the patient were reviewed by me and considered in my medical decision making (see chart for details).  Clinical Course as of Sep 20 1857  Tue Sep 20, 2017    1859 Reviewed elevated glucose, BUN, and creatinine   [DR]    Clinical Course User Index [DR] Pattricia Boss, MD   1- right sided weakness- patient seen as code stroke with neuro- please see neuro note. Symptoms mild and resolving, no indication tpa per neuro.  CT without acute changes.  May need mr and further stroke evaluation 2- syncope/chest discomfort- ddx- cardiac, stroke , seizure, hypoglycemia- patient without acute ekg changes, chronic a fib, initial troponin normal-, bs normal here, history not c.w. seizure 3- hyperglycemia 4- renal insufficiency- creatinine 1.4 here gradual trend up with last 11/22/16 -1.31 5-aortic atherosclerosis 6-thyroid nodules recommend nonemergent ultrasound per CT scan 7 A. fib chronic with anticoagulation INR is 3.7-   Final Clinical Impressions(s) / ED Diagnoses   Final diagnoses:  Syncope and collapse  Right sided weakness    ED Discharge Orders    None       Pattricia Boss, MD 09/20/17 2110

## 2017-09-20 NOTE — ED Notes (Signed)
EDP at bedside  

## 2017-09-20 NOTE — ED Triage Notes (Signed)
Patient presents with [facility] with sudden onset of "weird feeling in my stomach and head", very dizzy, HA.  EMS states right sided weakness and facial droop, AMS.  Patient axox3 upon arrival.

## 2017-09-20 NOTE — H&P (Signed)
History and Physical    [name] XQJ:[id number] DOB: 1927-01-16 DOA: 09/20/2017  PCP: Kathyrn Drown, MD  Patient coming from: Home  I have personally briefly reviewed patient's old medical records in [place]  Chief Complaint: R sided weakness  HPI: [name] is a 84 y.o. 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  ? Seizure disorder.  Patient was in usual state of health until 1530.  Sudden onset of dizziness, abnormal sensation in stomach, severe pain in R leg.  Family noted RSW and facial droop.  Patient brought in as code stroke.   ED Course: Symptoms have improved / resolved at this time.   Review of Systems: As per HPI otherwise 10 point review of systems negative.   Past Medical History:  Diagnosis Date  . Anemia    H/H of 11.3/36.7 in 12/2008 ;normal MCV; normal CBC in 2011  . Angioedema 08/2006  . Atrial fibrillation (HCC)    persistant  . Blood transfusion   . 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 Chronic bronchitis   . Chronic renal insufficiency    baseline creatine 1.4; 1.04 in 03/2010  . DJD (degenerative joint disease)    hands, knees  . Gout   . Hyperlipidemia   . Hypertension    heart disease diastolic dysfunction ; KG-81%; mild pulmonary edema in 2006;responded to diurectics ; LVH  . Kidney stones   . Mitral valve disease    Severe mitral annular calcification; mild to moderate stenosis-not clearly rheumatic  . Nephrolithiasis   . Pacemaker   . Retinal hemorrhage   . Seizure disorder ([place])   . Shortness of breath    only with exertion  . Stroke ([place])   . Tachycardia-bradycardia syndrome ([place])    with  pauses and syncope;PPM implantation 10/2008,negative stress nuclear study 03/2005  . Vertigo     Past Surgical History:  Procedure Laterality Date  . CATARACT EXTRACTION W/PHACO  03/29/2011   Procedure: CATARACT EXTRACTION PHACO AND INTRAOCULAR LENS PLACEMENT (IOC);  Surgeon: Tonny Branch;  Location: [facility];  Service: Ophthalmology;  Laterality: Right;  CDE 12.62  . INSERT / REPLACE / REMOVE PACEMAKER  11/2008   St.Jude DUAL chamber pacemaker 11/06/2008  . MEMBRANE PEEL Right 02/12/2014   Procedure: MEMBRANE PEEL;  Surgeon: [name], MD;  Location: [place];  Service: Ophthalmology;  Laterality: Right;  . PARS PLANA VITRECTOMY Right 02/12/2014   Procedure: PARS PLANA VITRECTOMY WITH 25 GAUGE;  Surgeon: [name], MD;  Location: [place];  Service: Ophthalmology;  Laterality: Right;  . TOTAL KNEE ARTHROPLASTY  04/2009   Right     reports that she has never smoked. She has never used smokeless tobacco. She reports that she does not drink alcohol or use drugs.  Allergies  Allergen Reactions  . Tramadol     Drowsiness    Family History  Problem Relation Age of Onset  . Cancer Other        unknown cancer  . Hypotension Neg Hx   . Anesthesia problems Neg Hx   . Pseudochol deficiency Neg Hx   . Malignant hyperthermia Neg Hx      Prior to  Admission medications   Medication Sig Start Date End Date Taking? Authorizing Provider  allopurinol (ZYLOPRIM) 100 MG tablet TAKE 1 TABLET BY MOUTH EVERY MONDAY, WEDNESDAY, AND FRIDAY. 07/06/17  Yes Luking, Scott A, MD  diltiazem (TIAZAC) 360 MG 24 hr capsule TAKE ONE CAPSULE BY MOUTH ONCE DAILY. 11/16/16  Yes Evans Lance, MD  dorzolamide-timolol (COSOPT) 22.3-6.8 MG/ML ophthalmic solution Place 1 drop into both eyes at bedtime.    Yes [provider]  furosemide (LASIX) 20 MG tablet TAKE (1) TABLET BY MOUTH ONCE DAILY. 09/14/17  Yes Luking, Elayne Snare, MD  lisinopril (PRINIVIL,ZESTRIL) 20 MG tablet TAKE (1) TABLET BY MOUTH ONCE DAILY.  07/06/17  Yes Evans Lance, MD  meclizine (ANTIVERT) 25 MG tablet Take 25 mg by mouth 3 (three) times daily as needed. Dizziness 11/11/10  Yes Imogene Burn, PA-C  pravastatin (PRAVACHOL) 80 MG tablet TAKE (1) TABLET BY MOUTH AT BEDTIME. 09/14/17  Yes Luking, Elayne Snare, MD  ranitidine (ZANTAC) 150 MG tablet TAKE (1) TABLET BY MOUTH TWICE DAILY. 07/06/17  Yes Luking, Elayne Snare, MD  VENTOLIN HFA 108 (90 Base) MCG/ACT inhaler TAKE 2 PUFFS EVERY 6 HOURS AS NEEDED Patient taking differently: TAKE 2 PUFFS EVERY 6 HOURS AS NEEDED FOR WHEEZING 07/13/16  Yes Luking, Scott A, MD  warfarin (COUMADIN) 5 MG tablet Take 2.5-5 mg by mouth See admin instructions. Take 1/2 tablet on Wednesday and Thursday then take 1 tablet all the other days   Yes [provider]  colchicine 0.6 MG tablet Take 0.6 mg by mouth daily as needed. Gout Flare Ups  08/31/11  [provider]  diltiazem (CARDIZEM CD) 360 MG 24 hr capsule TAKE (1) CAPSULE BY MOUTH ONCE DAILY. 08/12/11 08/31/11  Yehuda Savannah, MD  simvastatin (ZOCOR) 40 MG tablet Take 40 mg by mouth at bedtime.    08/31/11  [provider]    Physical Exam: Vitals:   09/20/17 1745 09/20/17 1845 09/20/17 1930 09/20/17 2015  BP: 136/66 139/81 (!) 145/61 (!) 151/74  Pulse: 68 65 69 75  Resp: (!) 24 20 18 17   Temp:      TempSrc:      SpO2: 97% 97% 98% 99%    Constitutional: NAD, calm, comfortable Eyes: PERRL, lids and conjunctivae normal ENMT: Mucous membranes are moist. Posterior pharynx clear of any exudate or lesions.Normal dentition.  Neck: normal, supple, no masses, no thyromegaly Respiratory: clear to auscultation bilaterally, no wheezing, no crackles. Normal respiratory effort. No accessory muscle use.  Cardiovascular: Regular rate and rhythm, no murmurs / rubs / gallops. No extremity edema. 2+ pedal pulses. No carotid bruits.  Abdomen: no tenderness, no masses palpated. No hepatosplenomegaly. Bowel sounds positive.  Musculoskeletal: no  clubbing / cyanosis. No joint deformity upper and lower extremities. Good ROM, no contractures. Normal muscle tone.  Skin: no rashes, lesions, ulcers. 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 Alert and oriented x 3. Normal mood.    Labs on Admission: I have personally reviewed following labs and imaging studies  CBC: Recent Labs  Lab 09/20/17 1633 09/20/17 1645  WBC 11.8*  --   NEUTROABS 6.8  --   HGB 14.7 15.6*  HCT 45.3 46.0  MCV 90.8  --   PLT 216  --    Basic Metabolic Panel: Recent Labs  Lab 09/20/17 1633 09/20/17 1645  NA 138 140  K 4.2 4.0  CL 105 102  CO2 22  --   GLUCOSE 144* 152*  BUN 28* 30*  CREATININE 1.47* 1.40*  CALCIUM 10.1  --    GFR: Estimated Creatinine Clearance: 28.9 mL/min (A) (by C-G formula based on SCr of 1.4 mg/dL (H)). Liver Function Tests: Recent Labs  Lab 09/20/17 1633  AST 26  ALT 20  ALKPHOS 69  BILITOT 1.0  PROT 6.6  ALBUMIN 3.6   No results for input(s): LIPASE, AMYLASE in the last 168 hours. No results for input(s): AMMONIA in the last 168 hours. Coagulation Profile: Recent Labs  Lab 09/20/17 1633  INR 3.70   Cardiac Enzymes: No results for input(s): CKTOTAL, CKMB, CKMBINDEX, TROPONINI in the last 168 hours. BNP (last 3 results) No results for input(s): PROBNP in the last 8760 hours. HbA1C: No results for input(s): HGBA1C in the last 72 hours. CBG: Recent Labs  Lab 09/20/17 1639  GLUCAP 144*   Lipid Profile: No results for input(s): CHOL, HDL, LDLCALC, TRIG, CHOLHDL, LDLDIRECT in the last 72 hours. Thyroid Function Tests: No results for input(s): TSH, T4TOTAL, FREET4, T3FREE, THYROIDAB in the last 72 hours. Anemia Panel: No results for input(s): VITAMINB12, FOLATE, FERRITIN, TIBC, IRON, RETICCTPCT in the last 72 hours. Urine analysis:    Component Value Date/Time   COLORURINE YELLOW 07/11/2014 2325    APPEARANCEUR CLEAR 07/11/2014 2325   LABSPEC 1.015 07/11/2014 2325   PHURINE 6.0 07/11/2014 2325   GLUCOSEU NEGATIVE 07/11/2014 2325   HGBUR TRACE (A) 07/11/2014 2325   BILIRUBINUR NEGATIVE 07/11/2014 2325   KETONESUR NEGATIVE 07/11/2014 2325   PROTEINUR NEGATIVE 07/11/2014 2325   UROBILINOGEN 1.0 07/11/2014 2325   NITRITE NEGATIVE 07/11/2014 2325   LEUKOCYTESUR NEGATIVE 07/11/2014 2325    Radiological Exams on Admission: Ct Angio Head W Or Wo Contrast  Result Date: 09/20/2017 CLINICAL DATA:  Right-sided weakness EXAM: CT ANGIOGRAPHY HEAD AND NECK TECHNIQUE: Multidetector CT imaging of the head and neck was performed using the standard protocol during bolus administration of intravenous contrast. Multiplanar CT image reconstructions and MIPs were obtained to evaluate the vascular anatomy. Carotid stenosis measurements (when applicable) are obtained utilizing NASCET criteria, using the distal internal carotid diameter as the denominator. CONTRAST:  51mL ISOVUE-370 IOPAMIDOL (ISOVUE-370) INJECTION 76% COMPARISON:  Head CT 09/20/2017 FINDINGS: CTA NECK FINDINGS AORTIC ARCH: There is moderate calcific atherosclerosis of the aortic arch. There is no aneurysm, dissection or hemodynamically significant stenosis of the visualized ascending aorta and aortic arch. Conventional 3 vessel aortic branching pattern. Moderate atherosclerosis of the proximal subclavian arteries without flow-limiting stenosis. RIGHT CAROTID SYSTEM: --Common carotid artery: Widely patent origin. Moderate mixed density atherosclerosis without hemodynamically significant stenosis. --Internal carotid artery: Predominantly calcified plaque at the proximal right ICA with less than 50% stenosis. Partially retropharyngeal course. --External carotid artery: Severe stenosis of the proximal right ECA due to predominantly calcified plaque. LEFT CAROTID SYSTEM: --Common carotid artery: Widely patent origin without common carotid artery dissection  or aneurysm. --Internal carotid artery:No dissection, occlusion or aneurysm. Mild atherosclerotic calcification at the carotid bifurcation without hemodynamically significant stenosis. --External carotid artery: Moderate stenosis proximally due to calcific atherosclerosis. VERTEBRAL ARTERIES: Left dominant configuration. The right vertebral artery is occluded at its origin. This is age indeterminate, but new compared to 08/20/2013. There is calcific gas pleural sclerosis at the origin of the left vertebral artery, which is otherwise widely patent to the vertebrobasilar confluence. There is opacification of the distal V3 and V4 segments of the right vertebral artery, likely via collateral flow across the PICAs. SKELETON: Multilevel degenerative  disc disease and facet hypertrophy without bony spinal canal stenosis. OTHER NECK: Near complete opacification of the right maxillary sinus. Bilateral hypodense thyroid nodules measuring up to 2.1 cm. UPPER CHEST: No pneumothorax or pleural effusion. No nodules or masses. CTA HEAD FINDINGS ANTERIOR CIRCULATION: --Intracranial internal carotid arteries: Normal. --Anterior cerebral arteries: Normal. Both A1 segments are present. --Middle cerebral arteries: Normal. --Posterior communicating arteries: Present bilaterally. POSTERIOR CIRCULATION: --Basilar artery: Normal. --Posterior cerebral arteries: Normal. --Superior cerebellar arteries: Normal. --Inferior cerebellar arteries: Normal anterior and posterior inferior cerebellar arteries. VENOUS SINUSES: As permitted by contrast timing, patent. ANATOMIC VARIANTS: Fetal origins of the posterior cerebellar arteries. DELAYED PHASE: Not performed. Review of the MIP images confirms the above findings. IMPRESSION: 1. No intracranial arterial occlusion or hemodynamically significant stenosis. 2. 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 The V4  segment is opacified via collateral flow. 3. Atherosclerotic calcification at both carotid bifurcations causing less than 50% stenosis of the proximal internal carotid arteries. There is moderate-to-severe stenosis of the proximal external carotid arteries. 4.  Aortic Atherosclerosis (ICD10-I70.0). 5. Thyroid nodules measuring up to 2.1 cm. If not previously obtained, nonemergent thyroid ultrasound is recommended for further assessment. These results were called by telephone at the time of interpretation on 09/20/2017 at 5:13 pm to Dr. Amie Portland , who verbally acknowledged these results. Electronically Signed   By: Ulyses Jarred M.D.   On: 09/20/2017 17:13   Ct Angio Neck W Or Wo Contrast  Result Date: 09/20/2017 CLINICAL DATA:  Right-sided weakness EXAM: CT ANGIOGRAPHY HEAD AND NECK TECHNIQUE: Multidetector CT imaging of the head and neck was performed using the standard protocol during bolus administration of intravenous contrast. Multiplanar CT image reconstructions and MIPs were obtained to evaluate the vascular anatomy. Carotid stenosis measurements (when applicable) are obtained utilizing NASCET criteria, using the distal internal carotid diameter as the denominator. CONTRAST:  18mL ISOVUE-370 IOPAMIDOL (ISOVUE-370) INJECTION 76% COMPARISON:  Head CT 09/20/2017 FINDINGS: CTA NECK FINDINGS AORTIC ARCH: There is moderate calcific atherosclerosis of the aortic arch. There is no aneurysm, dissection or hemodynamically significant stenosis of the visualized ascending aorta and aortic arch. Conventional 3 vessel aortic branching pattern. Moderate atherosclerosis of the proximal subclavian arteries without flow-limiting stenosis. RIGHT CAROTID SYSTEM: --Common carotid artery: Widely patent origin. Moderate mixed density atherosclerosis without hemodynamically significant stenosis. --Internal carotid artery: Predominantly calcified plaque at the proximal right ICA with less than 50% stenosis. Partially  retropharyngeal course. --External carotid artery: Severe stenosis of the proximal right ECA due to predominantly calcified plaque. LEFT CAROTID SYSTEM: --Common carotid artery: Widely patent origin without common carotid artery dissection or aneurysm. --Internal carotid artery:No dissection, occlusion or aneurysm. Mild atherosclerotic calcification at the carotid bifurcation without hemodynamically significant stenosis. --External carotid artery: Moderate stenosis proximally due to calcific atherosclerosis. VERTEBRAL ARTERIES: Left dominant configuration. The right vertebral artery is occluded at its origin. This is age indeterminate, but new compared to 08/20/2013. There is calcific gas pleural sclerosis at the origin of the left vertebral artery, which is otherwise widely patent to the vertebrobasilar confluence. There is opacification of the distal V3 and V4 segments of the right vertebral artery, likely via collateral flow across the PICAs. SKELETON: Multilevel degenerative disc disease and facet hypertrophy without bony spinal canal stenosis. OTHER NECK: Near complete opacification of the right maxillary sinus. Bilateral hypodense thyroid nodules measuring up to 2.1 cm. UPPER CHEST: No pneumothorax or pleural effusion. No nodules or  masses. CTA HEAD FINDINGS ANTERIOR CIRCULATION: --Intracranial internal carotid arteries: Normal. --Anterior cerebral arteries: Normal. Both A1 segments are present. --Middle cerebral arteries: Normal. --Posterior communicating arteries: Present bilaterally. POSTERIOR CIRCULATION: --Basilar artery: Normal. --Posterior cerebral arteries: Normal. --Superior cerebellar arteries: Normal. --Inferior cerebellar arteries: Normal anterior and posterior inferior cerebellar arteries. VENOUS SINUSES: As permitted by contrast timing, patent. ANATOMIC VARIANTS: Fetal origins of the posterior cerebellar arteries. DELAYED PHASE: Not performed. Review of the MIP images confirms the above findings.  IMPRESSION: 1. No intracranial arterial occlusion or hemodynamically significant stenosis. 2. 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 The V4 segment is opacified via collateral flow. 3. Atherosclerotic calcification at both carotid bifurcations causing less than 50% stenosis of the proximal internal carotid arteries. There is moderate-to-severe stenosis of the proximal external carotid arteries. 4.  Aortic Atherosclerosis (ICD10-I70.0). 5. Thyroid nodules measuring up to 2.1 cm. If not previously obtained, nonemergent thyroid ultrasound is recommended for further assessment. These results were called by telephone at the time of interpretation on 09/20/2017 at 5:13 pm to Dr. Amie Portland , who verbally acknowledged these results. Electronically Signed   By: Ulyses Jarred M.D.   On: 09/20/2017 17:13   Ct Head Code Stroke Wo Contrast  Result Date: 09/20/2017 CLINICAL DATA:  Code stroke.  Right facial droop and leg weakness. EXAM: CT HEAD WITHOUT CONTRAST TECHNIQUE: Contiguous axial images were obtained from the base of the skull through the vertex without intravenous contrast. COMPARISON:  12/08/2015 FINDINGS: Brain: No convincing acute infarction. Indistinct left putamen is similar to prior and fairly symmetric. No visible acute cortical infarct. No hemorrhage, hydrocephalus, or masslike finding. There is advanced chronic small vessel ischemia with extensive gliosis in the cerebral white matter. Remote small vessel infarcts in the bilateral cerebellum, bilateral thalamus, left caudate head, and bilateral deep white matter tracts. Vascular: Atherosclerotic calcification. Skull: No acute or aggressive finding. Sinuses/Orbits: Negative Other: These results were communicated to Dr. Rory Percy at 4:49 pmon 4/16/2019by text page via the [facility] messaging system. ASPECTS [facility] Stroke Program Early CT Score) - Ganglionic level  infarction (caudate, lentiform nuclei, internal capsule, insula, M1-M3 cortex): 7 - Supraganglionic infarction (M4-M6 cortex): 3 Total score (0-10 with 10 being normal): 10 IMPRESSION: 1. No acute hemorrhage. No definite acute infarct when compared to prior. 2. Advanced chronic small vessel disease with numerous remote small vessel infarcts. Infarcts have become more numerous in the bilateral cerebellum when compared to 2017. Electronically Signed   By: Monte Fantasia M.D.   On: 09/20/2017 16:52    EKG: Independently reviewed.  Assessment/Plan Principal Problem:   Acute ischemic stroke [facility]) Active Problems:   Hyperlipidemia   Hypertension   PPM-[employer]ude   Atrial fibrillation ([place])    1. Stroke vs TIA vs seizure - 1. Certianly has extensive history of multiple strokes, TIAs 2. Stroke pathway 3. Repeat CT at 1700 tomorrow 1. Cant MRI due to PPM, looks like PPM placed in 2010, they decided they couldn't MRI during prior TIA work up in 2015 admit. 4. EEG in AM 5. 2d echo 2. A.Fib - 1. Continue coumadin per pharm 1. Actually supratheraputic right now with INR 3.7 2. Continue cardizem for rate control 3. HTN - 1. Holding home lasix and lisinopril, allow permissive HTN 4. HLD - continue statin  DVT prophylaxis: coumadin Code Status: Full Family Communication: Family at bedside Disposition Plan: Home after admit, needs repeat CT at [place]  called: Neuro Admission status: Admit to inpatient - IP status, patient will be in hospital until Thurs at earliest, repeat CT at 1700 tomorrow.   Etta Quill DO [facility] Pager [phone]  If 7AM-7PM, please contact day team taking care of patient [email or web address] Password [email or web address]  09/20/2017, 8:50 PM

## 2017-09-20 NOTE — Code Documentation (Signed)
84 yo female coming from home via [place] with complaints of sudden onset of dizziness, right facial droop, and right sided weakness while at her home with her family. EMS called and activated a Code Stroke. Stroke Team met patient upon arrival to the ED. Initial NIHSS 3 due to answering question incorrectly, right leg drift, and right facial droop. EMS reported a right arm drift that had resolved during transport. Pt taken to CT. CTA complete. No tPA due to contraindicated. Pt is taking Coumadin. Pt to be admitted for Stroke workup. Handoff given to Joellen Jersey, Therapist, sports.

## 2017-09-20 NOTE — ED Notes (Signed)
Patient transported to radiology

## 2017-09-20 NOTE — Progress Notes (Signed)
[place] for warfarin Indication: atrial fibrillation   Assessment: 79 yof on warfarin PTA for afib, hx CVA presenting with stroke-like symptoms. Pharmacy consulted to dose warfarin inpatient per Neuro recommendations to continue, as exam not suggestive of large stroke and low bleed risk. INR supratherapeutic at 3.7 on admit. Hg 15.6, plt wnl on admit. No active bleed issues documented. CT head negative for bleed.  PTA warfarin dose: 2.5mg  daily (last dose 4/15 PTA)  Goal of Therapy:  INR 2-3 Monitor platelets by anticoagulation protocol: Yes   Plan:  Hold warfarin tonight with supratherapeutic INR Daily INR Monitor CBC, s/sx bleeding  Elicia Lamp, PharmD, BCPS Clinical Pharmacist 09/20/2017 8:24 PM

## 2017-09-20 NOTE — Consult Note (Signed)
Neurology Consultation  Reason for Consult: stroke Referring Physician: Dr Jeanell Sparrow  CC: RSW, light headedness  History is obtained from:EMT  HPI: [name] is a 84 y.o. 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   She was noted to be weak on the right per family. EMS was called. Their initial assessment was also significant for Right sided weakness. Code stroke was paged and she was brought to ER at [facility]. She had a CBG in 170s.  Her blood pressures also were described to be in the normal range on the way to the hospital. She denies any preceding flulike symptoms, fevers chills.  She denies any nausea vomiting.  She describes a headache at the time the symptoms happened but the headache has now subsided. Denies any cough.  Denies abdominal pain.  Denies urinary frequency or urgency.  Denies diarrhea.  Denies double vision.  LKW: 3:30 PM on 09/20/2017 tpa given?: no, nonfocal exam Premorbid modified Rankin scale (mRS): 2-3  ROS: ROS was performed and is negative except as noted in the HPI.  Past Medical History:  Diagnosis Date  . Anemia    H/H of 11.3/36.7 in 12/2008 ;normal MCV; normal CBC in 2011  . Angioedema 08/2006  . Atrial fibrillation (HCC)    persistant  . Blood transfusion   . 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 Chronic bronchitis   . Chronic renal insufficiency    baseline creatine 1.4; 1.04 in 03/2010  . DJD (degenerative joint disease)    hands, knees  . Gout   . Hyperlipidemia   . Hypertension    heart disease diastolic dysfunction ; [place]-37%; mild pulmonary edema in 2006;responded to diurectics ; LVH  . Kidney stones   . Mitral valve disease    Severe mitral annular calcification; mild to moderate stenosis-not clearly rheumatic  . Nephrolithiasis   . Pacemaker   . Retinal hemorrhage   . Seizure disorder ([place])   . Shortness of breath    only with exertion  . Stroke ([place])   . Tachycardia-bradycardia syndrome ([place])    with pauses and syncope;PPM implantation 10/2008,negative stress nuclear study 03/2005  . Vertigo     Family History  Problem Relation Age of Onset  . Cancer Other        unknown cancer  . Hypotension Neg Hx   . Anesthesia problems Neg Hx   . Pseudochol deficiency Neg Hx   . Malignant hyperthermia Neg Hx     Social History:   reports that she has never smoked. She has never used smokeless tobacco. She reports that she does not drink alcohol or use drugs.  Medications No current facility-administered medications for this encounter.   Current Outpatient Medications:  .  acetaminophen (TYLENOL) 325 MG tablet, Take 325 mg by mouth every 6 (six) hours  as needed for moderate pain., Disp: , Rfl:  .  allopurinol (ZYLOPRIM) 100 MG tablet, TAKE 1 TABLET BY MOUTH EVERY MONDAY, WEDNESDAY, AND FRIDAY., Disp: 12 tablet, Rfl: 0 .  diltiazem (TIAZAC) 360 MG 24 hr capsule, TAKE ONE CAPSULE BY MOUTH ONCE DAILY., Disp: 30 capsule, Rfl: 11 .  dorzolamide-timolol (COSOPT) 22.3-6.8 MG/ML ophthalmic solution, Place 1 drop into both eyes at bedtime. , Disp: , Rfl:  .  furosemide (LASIX) 20 MG tablet, TAKE (1) TABLET BY MOUTH ONCE DAILY., Disp: 30 tablet, Rfl: 5 .  lisinopril (PRINIVIL,ZESTRIL) 20 MG tablet, TAKE ONE TABLET BY MOUTH ONCE DAILY., Disp: 90 tablet,  Rfl: 2 .  lisinopril (PRINIVIL,ZESTRIL) 20 MG tablet, TAKE (1) TABLET BY MOUTH ONCE DAILY., Disp: 90 tablet, Rfl: 3 .  meclizine (ANTIVERT) 25 MG tablet, Take 25 mg by mouth 3 (three) times daily as needed. Dizziness, Disp: , Rfl:  .  pravastatin (PRAVACHOL) 80 MG tablet, TAKE (1) TABLET BY MOUTH AT BEDTIME., Disp: 90 tablet, Rfl: 1 .  predniSONE (DELTASONE) 20 MG tablet, 2 qd for 5 d, Disp: 10 tablet, Rfl: 0 .  ranitidine (ZANTAC) 150 MG tablet, TAKE (1) TABLET BY MOUTH TWICE DAILY., Disp: 60 tablet, Rfl: 0 .  VENTOLIN HFA 108 (90 Base) MCG/ACT inhaler, TAKE 2 PUFFS EVERY 6 HOURS AS NEEDED, Disp: 18 g, Rfl: 2 .  warfarin (COUMADIN) 5 MG tablet, TAKE 1/2 TO 1 TABLET BY MOUTH DAILY OR AS DIRECTED., Disp: 30 tablet, Rfl: 3  Exam: Current vital signs: There were no vitals taken for this visit. Vital signs in last 24 hours:   Heart rate 88/min regular Respiratory rate 20/min Blood pressure 126/76 General: Awake alert in no apparent distress HEENT: Normocephalic, atraumatic, dry mucous membranes, Lungs: Clear to auscultation bilaterally with no wheezing Cardiovascular: S1-S2 heard, regular rate rhythm Abdomen: Soft nontender nondistended Neurological exam Awake alert oriented x3.  Speech is clear. Naming, comprehension, repetition intact. 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 EOMI, visual fields full, mild flattening of the right nasolabial fold facial sensation intact, hearing intact, tongue/uvula/soft palate midline, normal sternocleidomastoid and trapezius muscle strength. No evidence of tongue atrophy or fibrillations Motor: 5/5 left upper extremity.  4+/5 right upper extremity with no vertical drift.  4/5 right lower extremity exam limited by pain.  4+/5 left lower extremity. Tone: is normal and bulk is normal Sensation- Intact to light touch bilaterally Coordination: FTN intact bilaterally Gait- deferred  NIHSS-3  Labs I have  reviewed labs in epic and the results pertinent to this consultation are:  CBC    Component Value Date/Time   WBC 6.0 11/22/2016 0906   WBC 8.6 07/11/2014 2255   RBC 4.17 11/22/2016 0906   RBC 4.31 07/11/2014 2255   HGB 12.1 11/22/2016 0906   HCT 38.0 11/22/2016 0906   PLT 200 11/22/2016 0906   MCV 91 11/22/2016 0906   MCH 29.0 11/22/2016 0906   MCH 29.2 07/11/2014 2255   MCHC 31.8 11/22/2016 0906   MCHC 32.2 07/11/2014 2255   RDW 14.1 11/22/2016 0906   LYMPHSABS 2.5 11/22/2016 0906   MONOABS 1.1 (H) 07/11/2014 2255   EOSABS 0.1 11/22/2016 0906   BASOSABS 0.0 11/22/2016 0906    CMP     Component Value Date/Time   NA 144 11/22/2016 0906   K 4.8 11/22/2016 0906   CL 106 11/22/2016 0906   CO2 25 11/22/2016 0906   GLUCOSE 128 (H) 11/22/2016 0906   GLUCOSE 111 (H)  07/11/2014 2255   BUN 22 11/22/2016 0906   CREATININE 1.31 (H) 11/22/2016 0906   CREATININE 1.17 (H) 04/12/2013 1244   CALCIUM 10.3 11/24/2016 0820   PROT 6.6 11/22/2016 0906   ALBUMIN 4.0 11/22/2016 0906   AST 15 11/22/2016 0906   ALT 10 11/22/2016 0906   ALKPHOS 70 11/22/2016 0906   BILITOT 0.2 11/22/2016 0906   GFRNONAA 37 (L) 11/22/2016 0906   GFRAA 42 (L) 11/22/2016 0906   Imaging I have reviewed the images obtained: CT-scan of the brain-no acute changes. CT angios of the head and neck was performed-shows occluded right vertebral artery-age undetermined.  Stenosis less than 50% at both carotid bifurcations.  Extensive calcifications of the arch all the way to the intracranial vessels.  Assessment:  84 year old woman past history of A. fib on Coumadin, hypertension, stroke with residual right-sided weakness, CRAO with residual vision loss in the right eye, chronic kidney disease, hyperlipidemia, status post pacemaker, documented seizure disorder, presenting for evaluation of right-sided weakness. Her history is suggestive of more of pain related weakness in the right leg.  Examination also is not pointing  towards one particular vascular territory.  CT angiogram of the head and neck though showed a occluded right vertebral artery, but that could be chronic and again cannot explain just the right leg weakness, which present in isolation would localize to the [place] territory and not to posterior circ.  Given that she has a risk factors though, I would recommend that she be admitted for stroke risk factor workup.  Other differentials to consider-recrudescence of old stroke symptoms in the setting of a systemic infection, right knee pathology causing right knee weakness, possible seizure disorder given the description of feeling unwell and describing sensation of her stomach not feeling right and heartburn followed by weakness although less likely but not impossible given history of old strokes.  Not a candidate for TPA due to nonfocal exam.  Not a candidate for endovascular treatment because of absence of signs pointing towards large vessel occlusion and CT angiogram negative for emergent large vessel occlusion.  Impression: Evaluate for stroke TIA Evaluate for stroke recrudescence Evaluate for possible seizures  Recommendations: Stroke risk factor workup: -Admit to hospitalist -Telemetry monitoring -Allow for permissive hypertension for the first 24-48h - only treat PRN if SBP >220 mmHg. Blood pressures can be gradually normalized to SBP<140 upon discharge. -MRI brain without contrast-if pacer is compatible. Otherwise repeat CTH w/o contrast at 1700 hrs on 09/22/15. (Last MRI documented in the chart is from 2010) -Echocardiogram -HgbA1c, fasting lipid panel -Frequent neuro checks -Prophylactic therapy-on coumadin for afib. Continue with Coumadin as exam is not suggestive of a large stroke and she will be at low risk for bleed.  Taking her off of Coumadin might put her at a increased risk of ischemic stroke. -Atorvastatin 80 mg PO daily -Risk factor modification -PT consult, OT consult, Speech  consult  Evaluate for seizure workup: -Obtain routine EEG in the morning.  Evaluate for stroke recrudescence: -Obtain UA and chest x-ray- -obtain CBC, CMP, liver function tests  Please page stroke NP/PA/MD (listed on AMION)  from 8am-4 pm as this patient will be followed by the stroke team at this point.  -- Amie Portland, MD [facility] Pager: [phone] If 7pm to 7am, please call on call as listed on AMION.

## 2017-09-21 ENCOUNTER — Inpatient Hospital Stay (HOSPITAL_COMMUNITY): Payer: Medicare HMO

## 2017-09-21 DIAGNOSIS — R531 Weakness: Secondary | ICD-10-CM

## 2017-09-21 DIAGNOSIS — I361 Nonrheumatic tricuspid (valve) insufficiency: Secondary | ICD-10-CM

## 2017-09-21 DIAGNOSIS — M79609 Pain in unspecified limb: Secondary | ICD-10-CM

## 2017-09-21 LAB — GLUCOSE, CAPILLARY
GLUCOSE-CAPILLARY: 148 mg/dL — AB (ref 65–99)
GLUCOSE-CAPILLARY: 209 mg/dL — AB (ref 65–99)
Glucose-Capillary: 172 mg/dL — ABNORMAL HIGH (ref 65–99)

## 2017-09-21 LAB — LIPID PANEL
CHOL/HDL RATIO: 2.2 ratio
Cholesterol: 121 mg/dL (ref 0–200)
HDL: 55 mg/dL (ref >40)
LDL Cholesterol: 24 mg/dL (ref 0–99)
Triglycerides: 209 mg/dL — ABNORMAL HIGH (ref <150)
VLDL: 42 mg/dL — ABNORMAL HIGH (ref 0–40)

## 2017-09-21 LAB — ECHOCARDIOGRAM COMPLETE
HEIGHTINCHES: 62 in
WEIGHTICAEL: 3160.51 [oz_av]

## 2017-09-21 LAB — HEMOGLOBIN A1C
Hgb A1c MFr Bld: 7.3 % — ABNORMAL HIGH (ref 4.8–5.6)
MEAN PLASMA GLUCOSE: 162.81 mg/dL

## 2017-09-21 LAB — PROTIME-INR
INR: 3.29
Prothrombin Time: 33.2 seconds — ABNORMAL HIGH (ref 11.4–15.2)

## 2017-09-21 MED ORDER — DILTIAZEM HCL ER COATED BEADS 180 MG PO CP24
360.0000 mg | ORAL_CAPSULE | Freq: Every day | ORAL | Status: DC
  Administered 2017-09-21 – 2017-09-22 (×2): 360 mg via ORAL
  Filled 2017-09-21 (×2): qty 2

## 2017-09-21 MED ORDER — ONDANSETRON HCL 4 MG/2ML IJ SOLN
4.0000 mg | Freq: Four times a day (QID) | INTRAMUSCULAR | Status: DC | PRN
  Administered 2017-09-21: 4 mg via INTRAVENOUS
  Filled 2017-09-21: qty 2

## 2017-09-21 MED ORDER — WARFARIN - PHARMACIST DOSING INPATIENT: Freq: Every day | Status: DC

## 2017-09-21 MED ORDER — WARFARIN SODIUM 2.5 MG PO TABS
2.5000 mg | ORAL_TABLET | Freq: Once | ORAL | Status: AC
  Administered 2017-09-21: 2.5 mg via ORAL
  Filled 2017-09-21: qty 1

## 2017-09-21 NOTE — Evaluation (Signed)
Occupational Therapy Evaluation Patient Details Name: [name] MRN: [id number] DOB: 03-24-1927 Today's Date: 09/21/2017    History of Present Illness 84 y.o. 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  ? Seizure disorder.  Patient was in usual state of health until 1530 on 09/20/17.  Sudden onset of dizziness, abnormal sensation in stomach, severe pain in R leg.  Family noted RSW and facial droop.  Patient brought in as code stroke.   Clinical Impression   Pt presents to OT with above and impairments listed below (see OT problem list) impacting pt ability to complete ADLs at PLOF.  Pt currently requires min guard with stand pivot transfers due to reports of dizziness and pain in Rt knee.  Pt and husband report pt independent with cane at home.  Limited eval due to pt c/o pain in knee with any standing and dizziness with mobility.  Pt will benefit from OT therapy acutely to address deficits and increase independence to return to home with [facility] and supervision.    Follow Up Recommendations  Home health OT;Supervision/Assistance - 24 hour    Equipment Recommendations  None recommended by OT    Recommendations for Other Services       Precautions / Restrictions Precautions Precautions: Fall      Mobility Bed Mobility Overal bed mobility: Needs Assistance Bed Mobility: Sit to Supine       Sit to supine: Supervision   General bed mobility comments: MIn cues for transfer to ensure proper positioning in bed  Transfers Overall transfer level: Needs assistance   Transfers: Sit to/from Stand;Stand Pivot Transfers Sit to Stand: Min guard Stand pivot transfers: Min guard       General transfer comment: bed <> BSC transfers with min guard due to pt reports of dizziness with mobility        ADL either performed or assessed with clinical judgement   ADL  Overall ADL's : Needs assistance/impaired     Grooming: Sitting;Set up   Upper Body Bathing: Set up;Sitting   Lower Body Bathing: Min guard;Sit to/from stand   Upper Body Dressing : Set up;Sitting   Lower Body Dressing: Min guard;Sit to/from stand;Minimal assistance   Toilet Transfer: Min guard;BSC   Toileting- Water quality scientist and Hygiene: Min guard;Sit to/from stand       Functional mobility during ADLs: Min guard;Minimal assistance General ADL Comments: Transferred bed <> BSC with min guard for toileting.  Pt able to complete toileting tasks to adjust hospital gown and complete hygiene with min guard.  Pt reports pain in Rt knee with standing and transfer, therefore pt declined any further OOB activity.     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) Patient Visual Report: No change from baseline Vision Assessment?: Vision impaired- to be further tested in functional context            Pertinent Vitals/Pain Pain Assessment: Faces Faces Pain Scale: Hurts even more Pain Location: Rt knee Pain Descriptors / Indicators: Aching Pain Intervention(s): Limited activity within patient's tolerance;Repositioned     Hand Dominance Right   Extremity/Trunk Assessment Upper Extremity Assessment Upper Extremity Assessment: Generalized weakness;RUE deficits/detail RUE Deficits / Details: premorbid Rt sided weakness, limited shoulder ROM to grossly 120* and loose gross grasp.  Strength grossly 4/5   Lower Extremity Assessment Lower Extremity Assessment: Defer to PT evaluation  Communication Communication Communication: No difficulties   Cognition Arousal/Alertness: Awake/alert Behavior During Therapy: WFL for tasks assessed/performed Overall Cognitive Status: Within Functional Limits for tasks assessed                                                Home Living Family/patient expects to be  discharged to:: Private residence Living Arrangements: Spouse/significant other Available Help at Discharge: Family;Available 24 hours/day Type of Home: House Home Access: Stairs to enter;Ramped entrance Entrance Stairs-Number of Steps: mixed reports from pt and husband.  Per report they have 6 small steps with rails and a ramp.  Will need further clarification.   Home Layout: One level     Bathroom Shower/Tub: Occupational psychologist: Standard Bathroom Accessibility: Yes How Accessible: Accessible via walker Home Equipment: Cane - single point;Shower seat   Additional Comments: Pt only used cane outside, as she reports she would misplace it in the home      Prior Functioning/Environment Level of Independence: Independent with assistive device(s)                 OT Problem List: Decreased strength;Decreased range of motion;Impaired balance (sitting and/or standing);Pain      OT Treatment/Interventions: Self-care/ADL training;Therapeutic exercise;Patient/family education    OT Goals(Current goals can be found in the care plan section) Acute Rehab OT Goals Patient Stated Goal: to not have pain OT Goal Formulation: With patient Time For Goal Achievement: 10/05/17 Potential to Achieve Goals: Good  OT Frequency: Min 2X/week    AM-PAC PT "6 Clicks" Daily Activity     Outcome Measure Help from another person eating meals?: None Help from another person taking care of personal grooming?: A Little Help from another person toileting, which includes using toliet, bedpan, or urinal?: A Little Help from another person bathing (including washing, rinsing, drying)?: A Little Help from another person to put on and taking off regular upper body clothing?: A Little Help from another person to put on and taking off regular lower body clothing?: A Little 6 Click Score: 19   End of Session    Activity Tolerance: Patient limited by pain Patient left: in bed;with  family/visitor present  OT Visit Diagnosis: Unsteadiness on feet (R26.81);Muscle weakness (generalized) (M62.81);Dizziness and giddiness (R42);Pain Pain - Right/Left: Right Pain - part of body: Knee                Time: 0902-0921 OT Time Calculation (min): 19 min Charges:  OT General Charges $OT Visit: 1 Visit OT Evaluation $OT Eval Moderate Complexity: [place], [place], [phone] 09/21/2017, 9:54 AM

## 2017-09-21 NOTE — Progress Notes (Addendum)
[facility] - DAILY PROGRESS NOTE    HISTORY [name] is a 84 y.o. 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   She was noted to be weak on the right per family. EMS was called. Their initial assessment was also significant for Right sided weakness. Code stroke was paged and she was brought to ER at [facility]. She had a CBG in 170s.  Her blood pressures also were described to be in the normal range on the way to the hospital. She denies any preceding flulike symptoms, fevers chills.  She denies any nausea vomiting.  She describes a headache at the time the symptoms happened but the headache has now subsided. Denies any cough.  Denies abdominal pain.  Denies urinary frequency or urgency.  Denies diarrhea.  Denies double vision. 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   SUBJECTIVE Patient in bed with family at the bedside.  Family states patient is back to her baseline mentation.  Patient is awake alert and oriented x3 in no distress, admits to left eye blindness from previous stroke. She admits to hx of chronic dizziness and takes meclizine prn, but denies dizziness at this time, denies headaches, nausea but had an episode of vomiting yesterday.  She denies loss of consciousness or extremity jerking today.   OBJECTIVE Most recent Vital Signs: Vitals:   09/20/17  2216 09/21/17 0016 09/21/17 0416 09/21/17 0747  BP: (!) 168/77 (!) 153/65 131/67 (!) 155/61  Pulse: 71 71 62 65  Resp: 18 16 14 14   Temp: 97.7 F (36.5 C) 97.9 F (36.6 C) 98.7 F (37.1 C) (!) 97.5 F (36.4 C)  TempSrc: Oral Oral Oral Oral  SpO2: 100%   97%  Weight: 89.6 kg (197 lb 8.5 oz)     Height: 5\' 2"  (1.575 m)      CBG (last 3)  Recent Labs    09/20/17 1639 09/21/17 0623 09/21/17 1213  GLUCAP 144* 172* 148*    Physical Exam  HEENT-  Normocephalic, no lesions, without obvious abnormality.  Left eye blindness with opacity Cardiovascular- S1-S2 audible, pulses palpable throughout   Lungs-no rhonchi or wheezing noted, no excessive working breathing.  Saturations within normal limits Abdomen- All 4 quadrants palpated and nontender Musculoskeletal-no joint tenderness, deformity or swelling Skin-warm and dry,   Neuro:  Mental Status: Alert, oriented, thought content appropriate.  Speech fluent without evidence of aphasia.  Patient with intact naming, comprehension, repetition and recall able to follow 3 step commands without difficulty. Cranial Nerves: II: Impaired left visual field secondary to blindness,  normal in right III,IV, VI: ptosis not present, extra-ocular motions  intact bilaterally pupils equal, round, reactive to light and accommodation V,VII: Left nasolabial flattening VIII: hearing intact to voice IX,X: uvula rises symmetrically XI: bilateral shoulder shrug XII: midline tongue extension Motor:  Right : Upper extremity   5/5    Left:     Upper extremity   4/5  Lower extremity   5/5     Lower extremity   4/5 Tone and bulk:normal tone throughout; no atrophy noted Sensory: Pinprick and light touch intact throughout, bilaterally Deep Tendon Reflexes: 2+ and symmetric throughout Plantars: Right: downgoing   Left: downgoing Cerebellar: normal finger-to-nose, normal rapid alternating movements and normal heel-to-shin test, despite pain Gait: deffered  IV  Fluid Intake:    MEDICATIONS  . allopurinol  100 mg Oral Q M,W,F  . diltiazem  360 mg Oral Daily  . dorzolamide-timolol  1 drop Both Eyes QHS  . famotidine  20 mg Oral BID  . pravastatin  80 mg Oral q1800   PRN:  acetaminophen **OR** acetaminophen (TYLENOL) oral liquid 160 mg/5 mL **OR** acetaminophen, albuterol, ondansetron, senna-docusate  Diet:  Fall precautions Diet Heart Room service appropriate? Yes; Fluid consistency: Thin   CLINICALLY SIGNIFICANT STUDIES Basic Metabolic Panel:  Recent Labs  Lab 09/20/17 1633 09/20/17 1645  NA 138 140  K 4.2 4.0  CL 105 102  CO2 22  --   GLUCOSE 144* 152*  BUN 28* 30*  CREATININE 1.47* 1.40*  CALCIUM 10.1  --    Liver Function Tests:  Recent Labs  Lab 09/20/17 1633  AST 26  ALT 20  ALKPHOS 69  BILITOT 1.0  PROT 6.6  ALBUMIN 3.6   CBC:  Recent Labs  Lab 09/20/17 1633 09/20/17 1645  WBC 11.8*  --   NEUTROABS 6.8  --   HGB 14.7 15.6*  HCT 45.3 46.0  MCV 90.8  --   PLT 216  --    Coagulation:  Recent Labs  Lab 09/20/17 1633 09/21/17 0519  LABPROT 36.4* 33.2*  INR 3.70 3.29   Lipid Panel    Component Value Date/Time   CHOL 121 09/21/2017 0519   CHOL 108 11/22/2016 0906   TRIG 209 (H) 09/21/2017 0519   HDL 55 09/21/2017 0519   HDL 49 11/22/2016 0906   CHOLHDL 2.2 09/21/2017 0519   VLDL 42 (H) 09/21/2017 0519   LDLCALC 24 09/21/2017 0519   LDLCALC 40 11/22/2016 0906   HgbA1C  Lab Results  Component Value Date   HGBA1C 7.3 (H) 09/21/2017    Urine Drug Screen:      Component Value Date/Time   LABOPIA NONE DETECTED 08/20/2013 0006   COCAINSCRNUR NONE DETECTED 08/20/2013 0006   LABBENZ NONE DETECTED 08/20/2013 0006   AMPHETMU NONE DETECTED 08/20/2013 0006   THCU NONE DETECTED 08/20/2013 0006   LABBARB NONE DETECTED 08/20/2013 0006    Alcohol Level: No results for input(s): ETH in the last 168 hours.  Ct Angio Head/Neck W Or Wo Contrast 09/20/2017 IMPRESSION:  1. No intracranial arterial occlusion  or hemodynamically significant stenosis.  2. 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 The V4 segment is opacified via collateral flow.  3. Atherosclerotic calcification at both carotid bifurcations causing less than 50% stenosis of the proximal internal carotid arteries. There is moderate-to-severe stenosis of the proximal external carotid arteries.  4.  Aortic Atherosclerosis (ICD10-I70.0).  5. Thyroid nodules measuring up to 2.1 cm. If not previously obtained, nonemergent thyroid ultrasound  is recommended for further assessment.  Chest 2 View  09/20/2017 IMPRESSION:  No active cardiopulmonary disease.  Cardiomegaly.   Ct Head Code Stroke Wo Contrast  09/20/2017  IMPRESSION:  1. No acute hemorrhage. No definite acute infarct when compared to prior.  2. Advanced chronic small vessel disease with numerous remote small vessel infarcts. Infarcts have become more numerous in the bilateral cerebellum when compared to 2017.   2D Echocardiogram :  Impressions: - No cardiac source of emboli was indentified. LV EF: 65% -   70% - Left ventricle: The cavity size was normal. Wall thickness was   increased in a pattern of moderate LVH. Systolic function was   vigorous. The estimated ejection fraction was in the range of 65%   to 70%. Wall motion was normal; there were no regional wall   motion abnormalities. - Aortic valve: Valve area (Vmax): 1.25 cm^2. - Mitral valve: Severely calcified annulus. Moderately thickened   leaflets . The findings are consistent with moderate stenosis.   There was mild regurgitation. Mean gradient (D): 7 mm Hg. Valve   area by pressure half-time: 1.24 cm^2. Valve area by continuity   equation (using LVOT flow): 0.5 cm^2. - Left atrium: The atrium was severely dilated. - Right ventricle: Pacer wire or catheter noted in right ventricle. - Right atrium: The atrium was severely  dilated. - Tricuspid valve: There was severe regurgitation. - Pulmonary arteries: Systolic pressure was mildly increased. PA   peak pressure: 43 mm Hg (S).    MRI brain unable to be completed due to patient having a pacemaker  Outstanding Stroke Work-up Studies:     EEG:                                                                      PENDING Repeat CT head  ASSESSMENT [name] is a 84 y.o. 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   She was noted to be weak on the right per family. EMS was called. Their initial assessment was also significant for Right sided weakness. Code stroke was paged and she was brought to ER at [facility].   1. TIA in patient with a history of previous multiple TIAs and stroke in 2015.   likely cardioembolic accident secondary to A. fib, but patient did come in with supratherapeutic INR or recrudescence of stroke.  MRI of the brain could not be done due to patient having a pacemaker, will repeat head CT today at 1700, to evaluate for improvement.  Full stroke workup was pursued with: Noncon CT of the head not showing any acute infarct or LVO, but did show advanced small vessel disease and infarcts which have become more numerous since the last CT. CTA head/neck showed no intracranial  arterial occlusion or hemodynamically significant stenosis, discharged calcifications are complete to the intracranial vessels.  Echocardiogram showed no cardiac source of emboli.  Patient also with metabolic  risk factors, triglycerides elevated at 204 and cholesterol, 121 hemoglobin A1c 7.3.  TIA symptoms have since resolved and patient's family state patient is back to her baseline mentation.  Patient to continue rehab therapy while in hospital and awaiting recommendations.  Continue Pravastatin and discussed risk factor modification with patient and family. Concerning for TIA symptoms in patient with supratherapeutic INR on Coumadin and will consider another NOAC or other antiplatelet.  Also will complete workup with EEG to rule out possible seizure source.  2.  A. fib continue Coumadin, INR today is 3.29.  Consider NOAC 3.  Hypertension, allow permissive hypertension for the next-24 hours and gradually reduce blood pressure to acceptable range of SBP 140 4.  Dyslipidemia 5.  Left eye blindness 6. Lumbar radiculopathy with right leg pain  Hospital day # 1  PLAN/RECOMMENDATIONS  Allow for permissive hypertension for the next 24hrs- only treat PRN if SBP >220 mmHg. Blood pressures can be gradually normalized to SBP<140 upon discharge.  Telemetry monitoring  CT head- non contrast today at 1700  Frequent neuro checks  Continue Coumadin   Pravastatin 40 mg PO daily  Risk factor modification  Early Rehab with PT consult, OT consult, Speech consult  Fall and seizure precautions   SIGNED Letha Cape DNP Neuro-hospitalist Team [phone] 09/21/2017, 2:51 PM   09/21/2017 ATTENDING ASSESSMENT    I have personally examined this patient, reviewed notes, independently viewed imaging studies, participated in medical decision making and plan of care.ROS completed by me personally and pertinent positives fully documented  I have made any additions or clarifications directly to the above note. Agree with note above.patient informed me that she developed severe pain in her right calf and leg and when she got up she passed out in the kitchen and had transient neurological symptoms following that  which may represent recrudescence of old deficits. Recommend check lower extremity venous Dopplers for DVT. Continue warfarin for stroke prevention as she is tolerating it well without significantly fluctuating INR. Long discussion with patient`s son and husband  at the bedside and answered questions. Greater than 50% time during this 35 minute visit was spent on counseling and coordination of care about TIA and atrial fibrillation and stroke risk prevention and treatment discussion and answering questions  Antony Contras, MD Medical Director [facility] Pager: [phone] 09/21/2017 3:38 PM    To contact Stroke Continuity provider, please refer to [email or web address] After hours, contact General Neurology

## 2017-09-21 NOTE — Progress Notes (Signed)
PROGRESS NOTE    [name]  GEZ:[id number] DOB: 1926-08-24 DOA: 09/20/2017 PCP: Kathyrn Drown, MD   Brief Narrative:  HPI on 09/20/2017 by Dr. Jennette Kettle [name] is a 84 y.o. 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  ? Seizure disorder.  Patient was in usual state of health until 1530.  Sudden onset of dizziness, abnormal sensation in stomach, severe pain in R leg.  Family noted RSW and facial droop.  Patient brought in as code stroke.  Assessment & Plan   Right-sided weakness/sudden onset of dizziness -no longer complaining of right sided weakness -CT head: No acute hemorrhage.  No definite acute infarct when compared to prior. -CTA head neck: No intracranial arterial occlusion or hemodynamically significant stenosis -Cannot obtain MRI due to pacemaker -Echocardiogram: EF 65-70 percent, no regional wall motion abnormalities.  No cardiac source of emboli identified -LDL 24, hemoglobin A1c 7.3 -Neurology consulted and appreciated, recommended EEG to evaluate for seizure, statin -currently on coumadin -PT/OT recommended HH -Discussed with PT, patient experienced dizziness with evaluation, will order Vestibular PT -pending further recommendations from neurology  -continue antiemetics   Atrial fibrillation -Continue Coumadin per pharmacy, INR currently 3.29 -Continue Cardizem for rate control  Essential hypertension -Given possible CVA, allowing for permissive hypertension -Lasix and lisinopril held  Hyperlipidemia -Continue statin  Thyroid nodule -Incidental finding on CTA head and neck -Nodule measuring 2.1 cm, patient will need thyroid ultrasound as an outpatient  Chronic kidney disease, stage III -Currently stable, continue to monitor   DVT Prophylaxis  Coumadin  Code Status: Full  Family Communication: Husband at  bedside  Disposition Plan:Admitted, pending further neurology recommendations and workup  Consultants Neurology  Procedures  Echocardiogram  Antibiotics   Anti-infectives (From admission, onward)   None      Subjective:   [name] seen and examined today.  No longer complaining of right-sided weakness.  Has some dizziness when she gets up.  Denies current chest pain, shortness of breath, abdominal pain, nausea or vomiting, diarrhea or constipation.  Patient did admit to one episode of nausea and vomiting upon admission.  Objective:   Vitals:   09/20/17 2216 09/21/17 0016 09/21/17 0416 09/21/17 0747  BP: (!) 168/77 (!) 153/65 131/67 (!) 155/61  Pulse: 71 71 62 65  Resp: 18 16 14 14   Temp: 97.7 F (36.5 C) 97.9 F (36.6 C) 98.7 F (37.1 C) (!) 97.5 F (36.4 C)  TempSrc: Oral Oral Oral Oral  SpO2: 100%   97%  Weight: 89.6 kg (197 lb 8.5 oz)     Height: 5\' 2"  (1.575 m)      No intake or output data in the 24 hours ending 09/21/17 1515 Filed Weights   09/20/17 2216  Weight: 89.6 kg (197 lb 8.5 oz)    Exam  General: Well developed, well nourished, NAD, appears stated age  HEENT: NCAT, mucous membranes moist.   Neck: Supple  Cardiovascular: S1 S2 auscultated, irregular  Respiratory: Clear to auscultation bilaterally with equal chest rise  Abdomen: Soft, nontender, nondistended, + bowel sounds  Extremities: warm dry without cyanosis clubbing or edema  Neuro: AAOx3, nonfocal  Psych: Normal affect and demeanor   Data Reviewed: I have personally reviewed following labs and imaging studies  CBC: Recent Labs  Lab 09/20/17 1633 09/20/17 1645  WBC 11.8*  --   NEUTROABS 6.8  --  HGB 14.7 15.6*  HCT 45.3 46.0  MCV 90.8  --   PLT 216  --    Basic Metabolic Panel: Recent Labs  Lab 09/20/17 1633 09/20/17 1645  NA 138 140  K 4.2 4.0  CL 105 102  CO2 22  --   GLUCOSE 144* 152*  BUN 28* 30*  CREATININE 1.47* 1.40*  CALCIUM 10.1  --     GFR: Estimated Creatinine Clearance: 28.9 mL/min (A) (by C-G formula based on SCr of 1.4 mg/dL (H)). Liver Function Tests: Recent Labs  Lab 09/20/17 1633  AST 26  ALT 20  ALKPHOS 69  BILITOT 1.0  PROT 6.6  ALBUMIN 3.6   No results for input(s): LIPASE, AMYLASE in the last 168 hours. No results for input(s): AMMONIA in the last 168 hours. Coagulation Profile: Recent Labs  Lab 09/20/17 1633 09/21/17 0519  INR 3.70 3.29   Cardiac Enzymes: No results for input(s): CKTOTAL, CKMB, CKMBINDEX, TROPONINI in the last 168 hours. BNP (last 3 results) No results for input(s): PROBNP in the last 8760 hours. HbA1C: Recent Labs    09/21/17 0519  HGBA1C 7.3*   CBG: Recent Labs  Lab 09/20/17 1639 09/21/17 0623 09/21/17 1213  GLUCAP 144* 172* 148*   Lipid Profile: Recent Labs    09/21/17 0519  CHOL 121  HDL 55  LDLCALC 24  TRIG 209*  CHOLHDL 2.2   Thyroid Function Tests: No results for input(s): TSH, T4TOTAL, FREET4, T3FREE, THYROIDAB in the last 72 hours. Anemia Panel: No results for input(s): VITAMINB12, FOLATE, FERRITIN, TIBC, IRON, RETICCTPCT in the last 72 hours. Urine analysis:    Component Value Date/Time   COLORURINE YELLOW 07/11/2014 2325   APPEARANCEUR CLEAR 07/11/2014 2325   LABSPEC 1.015 07/11/2014 2325   PHURINE 6.0 07/11/2014 2325   GLUCOSEU NEGATIVE 07/11/2014 2325   HGBUR TRACE (A) 07/11/2014 2325   BILIRUBINUR NEGATIVE 07/11/2014 2325   KETONESUR NEGATIVE 07/11/2014 2325   PROTEINUR NEGATIVE 07/11/2014 2325   UROBILINOGEN 1.0 07/11/2014 2325   NITRITE NEGATIVE 07/11/2014 2325   LEUKOCYTESUR NEGATIVE 07/11/2014 2325   Sepsis Labs: @LABRCNTIP (procalcitonin:4,lacticidven:4)  )No results found for this or any previous visit (from the past 240 hour(s)).    Radiology Studies: Ct Angio Head W Or Wo Contrast  Result Date: 09/20/2017 CLINICAL DATA:  Right-sided weakness EXAM: CT ANGIOGRAPHY HEAD AND NECK TECHNIQUE: Multidetector CT imaging of  the head and neck was performed using the standard protocol during bolus administration of intravenous contrast. Multiplanar CT image reconstructions and MIPs were obtained to evaluate the vascular anatomy. Carotid stenosis measurements (when applicable) are obtained utilizing NASCET criteria, using the distal internal carotid diameter as the denominator. CONTRAST:  3mL ISOVUE-370 IOPAMIDOL (ISOVUE-370) INJECTION 76% COMPARISON:  Head CT 09/20/2017 FINDINGS: CTA NECK FINDINGS AORTIC ARCH: There is moderate calcific atherosclerosis of the aortic arch. There is no aneurysm, dissection or hemodynamically significant stenosis of the visualized ascending aorta and aortic arch. Conventional 3 vessel aortic branching pattern. Moderate atherosclerosis of the proximal subclavian arteries without flow-limiting stenosis. RIGHT CAROTID SYSTEM: --Common carotid artery: Widely patent origin. Moderate mixed density atherosclerosis without hemodynamically significant stenosis. --Internal carotid artery: Predominantly calcified plaque at the proximal right ICA with less than 50% stenosis. Partially retropharyngeal course. --External carotid artery: Severe stenosis of the proximal right ECA due to predominantly calcified plaque. LEFT CAROTID SYSTEM: --Common carotid artery: Widely patent origin without common carotid artery dissection or aneurysm. --Internal carotid artery:No dissection, occlusion or aneurysm. Mild atherosclerotic calcification at the carotid bifurcation without hemodynamically  significant stenosis. --External carotid artery: Moderate stenosis proximally due to calcific atherosclerosis. VERTEBRAL ARTERIES: Left dominant configuration. The right vertebral artery is occluded at its origin. This is age indeterminate, but new compared to 08/20/2013. There is calcific gas pleural sclerosis at the origin of the left vertebral artery, which is otherwise widely patent to the vertebrobasilar confluence. There is  opacification of the distal V3 and V4 segments of the right vertebral artery, likely via collateral flow across the PICAs. SKELETON: Multilevel degenerative disc disease and facet hypertrophy without bony spinal canal stenosis. OTHER NECK: Near complete opacification of the right maxillary sinus. Bilateral hypodense thyroid nodules measuring up to 2.1 cm. UPPER CHEST: No pneumothorax or pleural effusion. No nodules or masses. CTA HEAD FINDINGS ANTERIOR CIRCULATION: --Intracranial internal carotid arteries: Normal. --Anterior cerebral arteries: Normal. Both A1 segments are present. --Middle cerebral arteries: Normal. --Posterior communicating arteries: Present bilaterally. POSTERIOR CIRCULATION: --Basilar artery: Normal. --Posterior cerebral arteries: Normal. --Superior cerebellar arteries: Normal. --Inferior cerebellar arteries: Normal anterior and posterior inferior cerebellar arteries. VENOUS SINUSES: As permitted by contrast timing, patent. ANATOMIC VARIANTS: Fetal origins of the posterior cerebellar arteries. DELAYED PHASE: Not performed. Review of the MIP images confirms the above findings. IMPRESSION: 1. No intracranial arterial occlusion or hemodynamically significant stenosis. 2. 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 The V4 segment is opacified via collateral flow. 3. Atherosclerotic calcification at both carotid bifurcations causing less than 50% stenosis of the proximal internal carotid arteries. There is moderate-to-severe stenosis of the proximal external carotid arteries. 4.  Aortic Atherosclerosis (ICD10-I70.0). 5. Thyroid nodules measuring up to 2.1 cm. If not previously obtained, nonemergent thyroid ultrasound is recommended for further assessment. These results were called by telephone at the time of interpretation on 09/20/2017 at 5:13 pm to Dr. Amie Portland , who verbally acknowledged these results.  Electronically Signed   By: Ulyses Jarred M.D.   On: 09/20/2017 17:13   Dg Chest 2 View  Result Date: 09/20/2017 CLINICAL DATA:  Stroke sudden onset dizziness EXAM: CHEST - 2 VIEW COMPARISON:  02/12/2014 FINDINGS: Left-sided pacing device as before. Cardiomegaly with aortic atherosclerosis. No pleural effusion or focal airspace disease. No pneumothorax. IMPRESSION: No active cardiopulmonary disease.  Cardiomegaly. Electronically Signed   By: Donavan Foil M.D.   On: 09/20/2017 21:57   Ct Angio Neck W Or Wo Contrast  Result Date: 09/20/2017 CLINICAL DATA:  Right-sided weakness EXAM: CT ANGIOGRAPHY HEAD AND NECK TECHNIQUE: Multidetector CT imaging of the head and neck was performed using the standard protocol during bolus administration of intravenous contrast. Multiplanar CT image reconstructions and MIPs were obtained to evaluate the vascular anatomy. Carotid stenosis measurements (when applicable) are obtained utilizing NASCET criteria, using the distal internal carotid diameter as the denominator. CONTRAST:  10mL ISOVUE-370 IOPAMIDOL (ISOVUE-370) INJECTION 76% COMPARISON:  Head CT 09/20/2017 FINDINGS: CTA NECK FINDINGS AORTIC ARCH: There is moderate calcific atherosclerosis of the aortic arch. There is no aneurysm, dissection or hemodynamically significant stenosis of the visualized ascending aorta and aortic arch. Conventional 3 vessel aortic branching pattern. Moderate atherosclerosis of the proximal subclavian arteries without flow-limiting stenosis. RIGHT CAROTID SYSTEM: --Common carotid artery: Widely patent origin. Moderate mixed density atherosclerosis without hemodynamically significant stenosis. --Internal carotid artery: Predominantly calcified plaque at the proximal right ICA with less than 50% stenosis. Partially retropharyngeal course. --External carotid artery: Severe stenosis of the proximal right ECA due to predominantly calcified plaque. LEFT CAROTID SYSTEM: --Common carotid artery:  Widely  patent origin without common carotid artery dissection or aneurysm. --Internal carotid artery:No dissection, occlusion or aneurysm. Mild atherosclerotic calcification at the carotid bifurcation without hemodynamically significant stenosis. --External carotid artery: Moderate stenosis proximally due to calcific atherosclerosis. VERTEBRAL ARTERIES: Left dominant configuration. The right vertebral artery is occluded at its origin. This is age indeterminate, but new compared to 08/20/2013. There is calcific gas pleural sclerosis at the origin of the left vertebral artery, which is otherwise widely patent to the vertebrobasilar confluence. There is opacification of the distal V3 and V4 segments of the right vertebral artery, likely via collateral flow across the PICAs. SKELETON: Multilevel degenerative disc disease and facet hypertrophy without bony spinal canal stenosis. OTHER NECK: Near complete opacification of the right maxillary sinus. Bilateral hypodense thyroid nodules measuring up to 2.1 cm. UPPER CHEST: No pneumothorax or pleural effusion. No nodules or masses. CTA HEAD FINDINGS ANTERIOR CIRCULATION: --Intracranial internal carotid arteries: Normal. --Anterior cerebral arteries: Normal. Both A1 segments are present. --Middle cerebral arteries: Normal. --Posterior communicating arteries: Present bilaterally. POSTERIOR CIRCULATION: --Basilar artery: Normal. --Posterior cerebral arteries: Normal. --Superior cerebellar arteries: Normal. --Inferior cerebellar arteries: Normal anterior and posterior inferior cerebellar arteries. VENOUS SINUSES: As permitted by contrast timing, patent. ANATOMIC VARIANTS: Fetal origins of the posterior cerebellar arteries. DELAYED PHASE: Not performed. Review of the MIP images confirms the above findings. IMPRESSION: 1. No intracranial arterial occlusion or hemodynamically significant stenosis. 2. 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 The V4 segment is opacified via collateral flow. 3. Atherosclerotic calcification at both carotid bifurcations causing less than 50% stenosis of the proximal internal carotid arteries. There is moderate-to-severe stenosis of the proximal external carotid arteries. 4.  Aortic Atherosclerosis (ICD10-I70.0). 5. Thyroid nodules measuring up to 2.1 cm. If not previously obtained, nonemergent thyroid ultrasound is recommended for further assessment. These results were called by telephone at the time of interpretation on 09/20/2017 at 5:13 pm to Dr. Amie Portland , who verbally acknowledged these results. Electronically Signed   By: Ulyses Jarred M.D.   On: 09/20/2017 17:13   Ct Head Code Stroke Wo Contrast  Result Date: 09/20/2017 CLINICAL DATA:  Code stroke.  Right facial droop and leg weakness. EXAM: CT HEAD WITHOUT CONTRAST TECHNIQUE: Contiguous axial images were obtained from the base of the skull through the vertex without intravenous contrast. COMPARISON:  12/08/2015 FINDINGS: Brain: No convincing acute infarction. Indistinct left putamen is similar to prior and fairly symmetric. No visible acute cortical infarct. No hemorrhage, hydrocephalus, or masslike finding. There is advanced chronic small vessel ischemia with extensive gliosis in the cerebral white matter. Remote small vessel infarcts in the bilateral cerebellum, bilateral thalamus, left caudate head, and bilateral deep white matter tracts. Vascular: Atherosclerotic calcification. Skull: No acute or aggressive finding. Sinuses/Orbits: Negative Other: These results were communicated to Dr. Rory Percy at 4:49 pmon 4/16/2019by text page via the [facility] messaging system. ASPECTS [facility] Stroke Program Early CT Score) - Ganglionic level infarction (caudate, lentiform nuclei, internal capsule, insula, M1-M3 cortex): 7 - Supraganglionic infarction (M4-M6 cortex): 3 Total score (0-10 with 10 being normal): 10 IMPRESSION: 1.  No acute hemorrhage. No definite acute infarct when compared to prior. 2. Advanced chronic small vessel disease with numerous remote small vessel infarcts. Infarcts have become more numerous in the bilateral cerebellum when compared to 2017. Electronically Signed   By: Monte Fantasia M.D.   On: 09/20/2017 16:52     Scheduled Meds: .  allopurinol  100 mg Oral Q M,W,F  . diltiazem  360 mg Oral Daily  . dorzolamide-timolol  1 drop Both Eyes QHS  . famotidine  20 mg Oral BID  . pravastatin  80 mg Oral q1800   Continuous Infusions:   LOS: 1 day   Time Spent in minutes   30 minutes  Alper Guilmette D.O. on 09/21/2017 at 3:15 PM  Between 7am to 7pm - Pager - [phone]  After 7pm go to [email or web address] - password [email or web address]  And look for the night coverage person covering for me after hours  [facility] Office  [phone]

## 2017-09-21 NOTE — Progress Notes (Signed)
Bilateral lower extremity venous duplex has been completed. Negative for DVT.  09/21/17 5:20 PM Carlos Levering RVT

## 2017-09-21 NOTE — Progress Notes (Signed)
  Echocardiogram 2D Echocardiogram has been performed.  [name] [name] 09/21/2017, 11:24 AM

## 2017-09-21 NOTE — Discharge Instructions (Addendum)

## 2017-09-21 NOTE — Procedures (Signed)
ELECTROENCEPHALOGRAM REPORT  Date of Study: 09/21/2017  Patient's Name: [name] MRN: [id number] Date of Birth: 04-08-1927  Referring Provider: Dr. Amie Portland  Clinical History: This is an 84 year old woman with sudden onset dizziness, sensation in stomach, right leg pain, facial droop.  Medications: No AEDs or sedation  Technical Summary: A multichannel digital EEG recording measured by the international 10-20 system with electrodes applied with paste and impedances below 5000 ohms performed in our laboratory with EKG monitoring in an awake and asleep patient.  Hyperventilation and photic stimulation were not performed.  The digital EEG was referentially recorded, reformatted, and digitally filtered in a variety of bipolar and referential montages for optimal display.    Description: The patient is awake and asleep during the recording.  During maximal wakefulness, there is a symmetric, medium voltage 8.5 Hz posterior dominant rhythm that attenuates with eye opening.  There is occasional focal theta and delta slowing over the left temporal region. During drowsiness and sleep, there is an increase in theta and delta slowing of the background.  Vertex waves and symmetric sleep spindles were seen.  Hyperventilation and photic stimulation were not performed.  There were no epileptiform discharges or electrographic seizures seen.    EKG lead was unremarkable.  Impression: This awake and asleep EEG is abnormal due to occasional focal slowing over the left temporal region.  Clinical Correlation of the above findings indicates focal cerebral dysfunction over the left temporal region suggestive of underlying structural or physiologic abnormality. The absence of epileptiform discharges does not exclude a clinical diagnosis of epilepsy. Clinical correlation is advised.   Ellouise Newer, M.D.

## 2017-09-21 NOTE — Evaluation (Signed)
Physical Therapy Evaluation Patient Details Name: [name] MRN: [id number] DOB: 01-31-1927 Today's Date: 09/21/2017   History of Present Illness  84 y.o. 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  ? Seizure disorder.  Patient was in usual state of health until 1530 on 09/20/17.  Sudden onset of dizziness, abnormal sensation in stomach, severe pain in R leg.  Family noted RSW and facial droop.  Patient brought in as code stroke.  Clinical Impression  Orders received for PT evaluation. Patient demonstrates deficits in functional mobility as indicated below. Will benefit from continued skilled PT to address deficits and maximize function. Will see as indicated and progress as tolerated.      Follow Up Recommendations Home health PT;Supervision for mobility/OOB    Equipment Recommendations  None recommended by PT    Recommendations for Other Services       Precautions / Restrictions Precautions Precautions: Fall      Mobility  Bed Mobility Overal bed mobility: Needs Assistance Bed Mobility: Sit to Supine       Sit to supine: Supervision   General bed mobility comments: MIn cues for transfer to ensure proper positioning in bed  Transfers Overall transfer level: Needs assistance   Transfers: Sit to/from Stand;Stand Pivot Transfers Sit to Stand: Min guard Stand pivot transfers: Min guard       General transfer comment: No physical assist required  Ambulation/Gait Ambulation/Gait assistance: Min guard Ambulation Distance (Feet): 50 Feet Assistive device: Rolling walker (2 wheeled) Gait Pattern/deviations: Decreased stride length;Step-to pattern;Trunk flexed;Wide base of support;Staggering right Gait velocity: decreased Gait velocity interpretation: <1.31 ft/sec, indicative of household ambulator General Gait Details: patient with + dizziness and  right list during ambulation, VCs for positioning within the RW and safety  Stairs            Wheelchair Mobility    Modified Rankin (Stroke Patients Only)       Balance Overall balance assessment: Mild deficits observed, not formally tested                                           Pertinent Vitals/Pain Pain Assessment: Faces Faces Pain Scale: Hurts a little bit Pain Location: Rt knee with palpation Pain Descriptors / Indicators: Aching Pain Intervention(s): Monitored during session    Home Living Family/patient expects to be discharged to:: Private residence Living Arrangements: Spouse/significant other Available Help at Discharge: Family;Available 24 hours/day Type of Home: House Home Access: Stairs to enter;Ramped entrance   Entrance Stairs-Number of Steps: mixed reports from pt and husband.  Per report they have 6 small steps with rails and a ramp.  Will need further clarification. Home Layout: One level Home Equipment: Cane - single point;Shower seat Additional Comments: Pt only used cane outside, as she reports she would misplace it in the home    Prior Function Level of Independence: Independent with assistive device(s)               Hand Dominance   Dominant Hand: Right    Extremity/Trunk Assessment   Upper Extremity Assessment Upper Extremity Assessment: Generalized weakness;RUE deficits/detail RUE Deficits / Details: premorbid Rt sided weakness, limited shoulder ROM to grossly 120* and loose gross grasp.  Strength grossly 4/5    Lower Extremity Assessment Lower Extremity  Assessment: Generalized weakness(inconsistent deficits)       Communication   Communication: No difficulties  Cognition Arousal/Alertness: Awake/alert Behavior During Therapy: WFL for tasks assessed/performed Overall Cognitive Status: Impaired/Different from baseline Area of Impairment: Attention;Memory;Following commands;Safety/judgement;Problem  solving                   Current Attention Level: Selective   Following Commands: Follows multi-step commands with increased time Safety/Judgement: Decreased awareness of deficits   Problem Solving: Requires verbal cues;Requires tactile cues General Comments: patient with some inconsistency t in response to questions      General Comments      Exercises     Assessment/Plan    PT Assessment Patient needs continued PT services  PT Problem List Decreased strength;Decreased activity tolerance;Decreased balance;Decreased mobility;Pain;Decreased safety awareness       PT Treatment Interventions DME instruction;Gait training;Stair training;Functional mobility training;Therapeutic activities;Therapeutic exercise;Balance training;Patient/family education    PT Goals (Current goals can be found in the Care Plan section)  Acute Rehab PT Goals [name] PT Goal Formulation: With patient/family Time For Goal Achievement: 10/05/17 Potential to Achieve Goals: Good    Frequency Min 3X/week   Barriers to discharge        Co-evaluation               AM-PAC PT "6 Clicks" Daily Activity  Outcome Measure Difficulty turning over in bed (including adjusting bedclothes, sheets and blankets)?: A Little Difficulty moving from lying on back to sitting on the side of the bed? : A Little Difficulty sitting down on and standing up from a chair with arms (e.g., wheelchair, bedside commode, etc,.)?: A Little Help needed moving to and from a bed to chair (including a wheelchair)?: A Little Help needed walking in hospital room?: A Little Help needed climbing 3-5 steps with a railing? : A Lot 6 Click Score: 17    End of Session Equipment Utilized During Treatment: Gait belt Activity Tolerance: Patient limited by fatigue Patient left: in bed;with call bell/phone within reach;with bed alarm set;with family/visitor present Nurse Communication: Mobility status PT  Visit Diagnosis: Unsteadiness on feet (R26.81);Muscle weakness (generalized) (M62.81)    Time: 6948-5462 PT Time Calculation (min) (ACUTE ONLY): 19 min   Charges:   PT Evaluation $PT Eval Moderate Complexity: 1 Mod     PT G Codes:        Alben Deeds, PT DPT  Board Certified Neurologic Specialist [place] 09/21/2017, 11:14 AM

## 2017-09-21 NOTE — Progress Notes (Signed)
EEG Completed; Results Pending  

## 2017-09-21 NOTE — Evaluation (Signed)
Speech Language Pathology Evaluation Patient Details Name: [name] MRN: [id number] DOB: [birth year] Today's Date: 09/21/2017 Time: 2725-3664 SLP Time Calculation (min) (ACUTE ONLY): 17 min  Problem List:  Patient Active Problem List   Diagnosis Date Noted  . Acute ischemic stroke ([place]) 09/20/2017  . Controlled type 2 diabetes mellitus with complication, without long-term current use of insulin ([place]) 07/08/2016  . Prediabetes 12/25/2015  . 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 Vitreous hemorrhage ([place]) 02/12/2014  . TIA (transient ischemic attack) 08/20/2013  . Encounter for therapeutic drug monitoring 06/28/2013  . Gout 11/14/2012  . Fasting hyperglycemia 04/06/2012  . Atrial fibrillation ([place]) 08/31/2011  . Chronic anticoagulation 12/16/2010  . 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 Cerebrovascular accident ([place])   . DJD (degenerative joint disease)   . Hypertension 02/21/2009  . Sick sinus syndrome ([place]) 02/21/2009  . Hyperlipidemia 11/26/2008  . SEIZURE DISORDER 10/31/2008   Past Medical History:  Past Medical History:  Diagnosis Date  . Anemia    H/H of 11.3/36.7 in 12/2008 ;normal MCV; normal CBC in 2011  . Angioedema 08/2006  . Atrial fibrillation ([facility])    persistant  . Blood transfusion   . Cerebrovascular accident [facility]) 2006   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 ;magnetic MRI->  multiple cerebral infarctions; Rx-ASA but subsequently changed to coumadin  when found to have rheumatic mitral valve disease carotid duplex mild plaque in 08/2008  . Chronic bronchitis   . Chronic renal insufficiency    baseline creatine 1.4; 1.04 in 03/2010  . DJD (degenerative joint disease)    hands, knees  . Gout   . Hyperlipidemia   . Hypertension    heart disease diastolic dysfunction ; QI-34%; mild pulmonary edema in 2006;responded to diurectics ; LVH  . Kidney stones   . Mitral valve disease    Severe mitral annular calcification; mild to moderate stenosis-not clearly rheumatic   . Nephrolithiasis   . Pacemaker   . Retinal hemorrhage   . Seizure disorder ([place])   . Shortness of breath    only with exertion  . Stroke ([place])   . Tachycardia-bradycardia syndrome ([place])    with pauses and syncope;PPM implantation 10/2008,negative stress nuclear study 03/2005  . Vertigo    Past Surgical History:  Past Surgical History:  Procedure Laterality Date  . CATARACT EXTRACTION W/PHACO  03/29/2011   Procedure: CATARACT EXTRACTION PHACO AND INTRAOCULAR LENS PLACEMENT (IOC);  Surgeon: Tonny Branch;  Location: [facility];  Service: Ophthalmology;  Laterality: Right;  CDE 12.62  . INSERT / REPLACE / REMOVE PACEMAKER  11/2008   St.Jude DUAL chamber pacemaker 11/06/2008  . MEMBRANE PEEL Right 02/12/2014   Procedure: MEMBRANE PEEL;  Surgeon: [name], MD;  Location: [place];  Service: Ophthalmology;  Laterality: Right;  . PARS PLANA VITRECTOMY Right 02/12/2014   Procedure: PARS PLANA VITRECTOMY WITH 25 GAUGE;  Surgeon: [name], MD;  Location: [place] [facility];  Service: Ophthalmology;  Laterality: Right;  . TOTAL KNEE ARTHROPLASTY  04/2009   Right   HPI:  84 y.o. 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  ? Seizure disorder.  Pt admitted with sudden onset of dizziness, abnormal sensation in stomach, severe pain in R leg.  Family noted RSW and facial droop.  CT no acute hemorrhage. No definite acute infarct when compared to prior, advanced chronic small vessel disease with numerous remote small vessel infarcts. Infarcts  have become more numerous in the bilateral   Assessment / Plan / Recommendation Clinical Impression  Pt lives at home with spouse with close check in from family. Pt and husband are responsible for finances with assist from children and daughter. Daughter fills pill box each week. Pt drew clock with min assist to write numbers and max multimodal assist to write  dictated time on clock. Family present states she has good family support. Provided cognitive education. No further ST needed at this time.      SLP Assessment  SLP Recommendation/Assessment: Patient does not need any further Speech Lanaguage Pathology Services SLP Visit Diagnosis: Cognitive communication deficit (R41.841)    Follow Up Recommendations  None    Frequency and Duration           SLP Evaluation Cognition  Overall Cognitive Status: (at baseline) Arousal/Alertness: Awake/alert Orientation Level: Oriented to person;Oriented to place Attention: Sustained Sustained Attention: Appears intact Memory: (not formally assessed, suspect deficits) Awareness: Impaired Awareness Impairment: Emergent impairment;Anticipatory impairment Problem Solving: Impaired Problem Solving Impairment: Functional basic Safety/Judgment: Other (comment)(needs intermittent supervision)       Comprehension  Auditory Comprehension Overall Auditory Comprehension: Appears within functional limits for tasks assessed Visual Recognition/Discrimination Discrimination: Not tested Reading Comprehension Reading Status: Not tested    Expression Expression Primary Mode of Expression: Verbal Verbal Expression Overall Verbal Expression: Appears within functional limits for tasks assessed Initiation: No impairment Level of Generative/Spontaneous Verbalization: Sentence Pragmatics: No impairment Written Expression Dominant Hand: Right Written Expression: Not tested   Oral / Motor  Oral Motor/Sensory Function Overall Oral Motor/Sensory Function: Within functional limits Motor Speech Overall Motor Speech: Appears within functional limits for tasks assessed Respiration: Within functional limits Phonation: Normal Resonance: Within functional limits Articulation: Within functional limitis Intelligibility: Intelligible Motor Planning: Witnin functional limits   GO                    Houston Siren 09/21/2017, 3:23 PM  Orbie Pyo Liam Cammarata M.Ed [employer] [phone]

## 2017-09-21 NOTE — Progress Notes (Signed)
ANTICOAGULATION CONSULT NOTE - Follow Up Consult  Pharmacy Consult for Coumadin Indication: atrial fibrillation and prior stroke and TIAs  Allergies  Allergen Reactions  . Tramadol     Drowsiness    Patient Measurements: Height: 5\' 2"  (157.5 cm) Weight: 197 lb 8.5 oz (89.6 kg) IBW/kg (Calculated) : 50.1  Vital Signs: Temp: 97.5 F (36.4 C) (04/17 0747) Temp Source: Oral (04/17 0747) BP: 155/61 (04/17 0747) Pulse Rate: 65 (04/17 0747)  Labs: Recent Labs    09/20/17 1633 09/20/17 1645 09/21/17 0519  HGB 14.7 15.6*  --   HCT 45.3 46.0  --   PLT 216  --   --   APTT 28  --   --   LABPROT 36.4*  --  33.2*  INR 3.70  --  3.29  CREATININE 1.47* 1.40*  --     Estimated Creatinine Clearance: 28.9 mL/min (A) (by C-G formula based on SCr of 1.4 mg/dL (H)).  Assessment:  84 yr old female admitted last night with stroke-like symptoms, now resovled.  Continues on Coumadin for atrial fibrillation and prior stroke and TIAs.       INR 3.70 on admit 4/16 and Coumadin held.  INR down to 3.29 today.  Patient reports just finished 5-day course of Prednisone 20 mg BID on 4/16, which may have effected INR.     Home Coumadin regimen: 2.5 mg daily (using 5 mg tablets)  Goal of Therapy:  INR 2-3 Monitor platelets by anticoagulation protocol: Yes   Plan:   Coumadin 2.5 mg today, usual dose, to try to avoid INR falling too low.  Daily PT/INR.  Consider NOAC per Dr. Leonie Man.   Will follow up plans.  Arty Baumgartner, [place] Pager: [phone] 09/21/2017,4:35 PM

## 2017-09-22 ENCOUNTER — Telehealth: Payer: Self-pay | Admitting: Family Medicine

## 2017-09-22 LAB — CBC
HEMATOCRIT: 41.3 % (ref 36.0–46.0)
Hemoglobin: 13 g/dL (ref 12.0–15.0)
MCH: 28.8 pg (ref 26.0–34.0)
MCHC: 31.5 g/dL (ref 30.0–36.0)
MCV: 91.4 fL (ref 78.0–100.0)
PLATELETS: 212 10*3/uL (ref 150–400)
RBC: 4.52 MIL/uL (ref 3.87–5.11)
RDW: 13.2 % (ref 11.5–15.5)
WBC: 9.2 10*3/uL (ref 4.0–10.5)

## 2017-09-22 LAB — BASIC METABOLIC PANEL
Anion gap: 6 (ref 5–15)
BUN: 31 mg/dL — ABNORMAL HIGH (ref 6–20)
CALCIUM: 9.2 mg/dL (ref 8.9–10.3)
CO2: 27 mmol/L (ref 22–32)
Chloride: 105 mmol/L (ref 101–111)
Creatinine, Ser: 1.47 mg/dL — ABNORMAL HIGH (ref 0.44–1.00)
GFR calc Af Amer: 36 mL/min — ABNORMAL LOW (ref >60)
GFR, EST NON AFRICAN AMERICAN: 31 mL/min — AB (ref >60)
GLUCOSE: 152 mg/dL — AB (ref 65–99)
Potassium: 4.4 mmol/L (ref 3.5–5.1)
Sodium: 138 mmol/L (ref 135–145)

## 2017-09-22 LAB — PROTIME-INR
INR: 2.57
Prothrombin Time: 27.4 seconds — ABNORMAL HIGH (ref 11.4–15.2)

## 2017-09-22 NOTE — Telephone Encounter (Signed)
Patient scheduled hospital follow up next week with Dr Nicki Reaper and will bring all medications to visit.

## 2017-09-22 NOTE — Discharge Summary (Signed)
Physician Discharge Summary  [name] BMW:[id number] DOB: 1926/11/03 DOA: 09/20/2017  PCP: Kathyrn Drown, MD  Admit date: 09/20/2017 Discharge date: 09/22/2017  Time spent: 45 minutes  Recommendations for Outpatient Follow-up:  Patient will be discharged to home with home healthy physical and occupational therapy.  Patient will need to follow up with primary care provider within one week of discharge.  Patient should continue medications as prescribed.  Patient should follow a heart healthy diet.   Discharge Diagnoses:  Right-sided weakness/sudden onset of dizziness Atrial fibrillation Essential hypertension Hyperlipidemia Thyroid nodule Chronic kidney disease, stage III  Discharge Condition: Stable  Diet recommendation: heart healthy  Filed Weights   09/20/17 2216  Weight: 89.6 kg (197 lb 8.5 oz)    History of present illness:  on 09/20/2017 by Dr. 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 ? Seizure disorder. Patient was in usual state of health until 1530. Sudden onset of dizziness, abnormal sensation in stomach, severe pain in R leg. Family noted RSW and facial droop. Patient brought in as code stroke.  Hospital Course:  Right-sided weakness/sudden onset of dizziness/ TIA -no longer complaining of right sided weakness -CT head 4/16: No acute hemorrhage.  No definite acute infarct when compared to prior. -CTA head neck: No intracranial arterial occlusion or hemodynamically significant stenosis -Cannot obtain MRI due to pacemaker -Echocardiogram: EF 65-70 percent, no regional wall motion abnormalities.  No cardiac source of emboli identified -LDL 24, hemoglobin A1c 7.3 -Neurology consulted and appreciated, recommended EEG to evaluate for seizure, statin -EEG: awake and asleep EEG is abnormal due to  occasional focal slowing over the left temporal region  -Repeat CT head 4/17: No acute abnormality.  Extensive chronic ischemic changes are stable -currently on coumadin -PT/OT recommended HH -Discussed with PT, patient experienced dizziness with evaluation, Vestibular PT ordered -pending further recommendations from neurology  -continue antiemetics  -Discussed with Dr. Leonie Man, neurology. Would not make any changes to medications, does not need outpt follow up.   Atrial fibrillation -Continue Coumadin per pharmacy, INR currently 2.57 -Continue Cardizem for rate control  Essential hypertension -Given possible CVA, allowing for permissive hypertension -Lasix and lisinopril held, may resume on discharge  Hyperlipidemia -Continue statin  Thyroid nodule -Incidental finding on CTA head and neck -Nodule measuring 2.1 cm, patient will need thyroid ultrasound as an outpatient  Chronic kidney disease, stage III -Currently stable, currently 1.47  Lower extremity pain -resolved  -LE doppler negative for DVT  Procedures: Echocardiogram EEG Lower extremity doppler  Consultations: Neurology  Discharge Exam: Vitals:   09/22/17 0953 09/22/17 1014  BP: (!) 132/106 139/67  Pulse: 75   Resp: 17   Temp: 97.8 F (36.6 C)   SpO2:     No longer complaining of pain in her back or legs.  Denies any current chest pain, shortness breath, dizziness, headache, abdominal pain, nausea or vomiting, diarrhea or constipation.   General: Well developed, well nourished, NAD, appears stated age  37: NCAT, mucous membranes moist.  Neck: Supple  Cardiovascular: S1 S2 auscultated, irregular  Respiratory: Clear to auscultation bilaterally with equal chest rise  Abdomen: Soft, nontender, nondistended, + bowel sounds  Extremities: warm dry without cyanosis clubbing or edema  Neuro: AAOx3, nonfocal  Psych: appropriate mood and affect, pleasant  Discharge Instructions Discharge  Instructions    Discharge instructions   Complete by:  As directed    Patient will be discharged to home with home healthy physical and occupational therapy.  Patient will need to follow up with primary care provider within one week of discharge.  Patient should continue medications as prescribed.  Patient should follow a heart healthy diet.     Allergies as of 09/22/2017      Reactions   Tramadol    Drowsiness      Medication List    TAKE these medications   allopurinol 100 MG tablet Commonly known as:  Davis 1 TABLET BY MOUTH EVERY MONDAY, [place], AND FRIDAY.   diltiazem 360 MG 24 hr capsule Commonly known as:  TIAZAC TAKE ONE CAPSULE BY MOUTH ONCE DAILY.   dorzolamide-timolol 22.3-6.8 MG/ML ophthalmic solution Commonly known as:  COSOPT Place 1 drop into both eyes at bedtime.   furosemide 20 MG tablet Commonly known as:  LASIX TAKE (1) TABLET BY MOUTH ONCE DAILY.   lisinopril 20 MG tablet Commonly known as:  PRINIVIL,ZESTRIL TAKE (1) TABLET BY MOUTH ONCE DAILY.   meclizine 25 MG tablet Commonly known as:  ANTIVERT Take 25 mg by mouth 3 (three) times daily as needed. Dizziness   pravastatin 80 MG tablet Commonly known as:  PRAVACHOL TAKE (1) TABLET BY MOUTH AT BEDTIME.   ranitidine 150 MG tablet Commonly known as:  ZANTAC TAKE (1) TABLET BY MOUTH TWICE DAILY.   VENTOLIN HFA 108 (90 Base) MCG/ACT inhaler Generic drug:  albuterol TAKE 2 PUFFS EVERY 6 HOURS AS NEEDED What changed:  See the new instructions.   warfarin 5 MG tablet Commonly known as:  COUMADIN Take as directed. If you are unsure how to take this medication, talk to your nurse or doctor. Original instructions:  Take 2.5-5 mg by mouth See admin instructions. Take 1/2 tablet on Wednesday and Thursday then take 1 tablet all the other days      Allergies  Allergen Reactions  . Tramadol     Drowsiness   Follow-up Information    Kathyrn Drown, MD. Schedule an appointment as soon as  possible for a visit in 1 week(s).   Specialty:  Family Medicine Why:  Hospital follow up; discuss possible thyroid ultrasound Contact information: [address] [address] [phone]            The results of significant diagnostics from this hospitalization (including imaging, microbiology, ancillary and laboratory) are listed below for reference.    Significant Diagnostic Studies: Ct Angio Head W Or Wo Contrast  Result Date: 09/20/2017 CLINICAL DATA:  Right-sided weakness EXAM: CT ANGIOGRAPHY HEAD AND NECK TECHNIQUE: Multidetector CT imaging of the head and neck was performed using the standard protocol during bolus administration of intravenous contrast. Multiplanar CT image reconstructions and MIPs were obtained to evaluate the vascular anatomy. Carotid stenosis measurements (when applicable) are obtained utilizing NASCET criteria, using the distal internal carotid diameter as the denominator. CONTRAST:  45mL ISOVUE-370 IOPAMIDOL (ISOVUE-370) INJECTION 76% COMPARISON:  Head CT 09/20/2017 FINDINGS: CTA NECK FINDINGS AORTIC ARCH: There is moderate calcific atherosclerosis of the aortic arch. There is no aneurysm, dissection or hemodynamically significant stenosis of the visualized ascending aorta and aortic arch. Conventional 3 vessel aortic branching pattern. Moderate atherosclerosis of the proximal subclavian arteries without flow-limiting stenosis. RIGHT CAROTID SYSTEM: --Common carotid artery: Widely patent origin. Moderate mixed density atherosclerosis without hemodynamically significant stenosis. --Internal carotid artery: Predominantly calcified plaque at the proximal right ICA with less than 50% stenosis. Partially retropharyngeal course. --External carotid  artery: Severe stenosis of the proximal right ECA due to predominantly calcified plaque. LEFT CAROTID SYSTEM: --Common carotid artery: Widely patent origin without common carotid artery dissection or aneurysm.  --Internal carotid artery:No dissection, occlusion or aneurysm. Mild atherosclerotic calcification at the carotid bifurcation without hemodynamically significant stenosis. --External carotid artery: Moderate stenosis proximally due to calcific atherosclerosis. VERTEBRAL ARTERIES: Left dominant configuration. The right vertebral artery is occluded at its origin. This is age indeterminate, but new compared to 08/20/2013. There is calcific gas pleural sclerosis at the origin of the left vertebral artery, which is otherwise widely patent to the vertebrobasilar confluence. There is opacification of the distal V3 and V4 segments of the right vertebral artery, likely via collateral flow across the PICAs. SKELETON: Multilevel degenerative disc disease and facet hypertrophy without bony spinal canal stenosis. OTHER NECK: Near complete opacification of the right maxillary sinus. Bilateral hypodense thyroid nodules measuring up to 2.1 cm. UPPER CHEST: No pneumothorax or pleural effusion. No nodules or masses. CTA HEAD FINDINGS ANTERIOR CIRCULATION: --Intracranial internal carotid arteries: Normal. --Anterior cerebral arteries: Normal. Both A1 segments are present. --Middle cerebral arteries: Normal. --Posterior communicating arteries: Present bilaterally. POSTERIOR CIRCULATION: --Basilar artery: Normal. --Posterior cerebral arteries: Normal. --Superior cerebellar arteries: Normal. --Inferior cerebellar arteries: Normal anterior and posterior inferior cerebellar arteries. VENOUS SINUSES: As permitted by contrast timing, patent. ANATOMIC VARIANTS: Fetal origins of the posterior cerebellar arteries. DELAYED PHASE: Not performed. Review of the MIP images confirms the above findings. IMPRESSION: 1. No intracranial arterial occlusion or hemodynamically significant stenosis. 2. 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 The V4 segment is  opacified via collateral flow. 3. Atherosclerotic calcification at both carotid bifurcations causing less than 50% stenosis of the proximal internal carotid arteries. There is moderate-to-severe stenosis of the proximal external carotid arteries. 4.  Aortic Atherosclerosis (ICD10-I70.0). 5. Thyroid nodules measuring up to 2.1 cm. If not previously obtained, nonemergent thyroid ultrasound is recommended for further assessment. These results were called by telephone at the time of interpretation on 09/20/2017 at 5:13 pm to Dr. Amie Portland , who verbally acknowledged these results. Electronically Signed   By: Ulyses Jarred M.D.   On: 09/20/2017 17:13   Dg Chest 2 View  Result Date: 09/20/2017 CLINICAL DATA:  Stroke sudden onset dizziness EXAM: CHEST - 2 VIEW COMPARISON:  02/12/2014 FINDINGS: Left-sided pacing device as before. Cardiomegaly with aortic atherosclerosis. No pleural effusion or focal airspace disease. No pneumothorax. IMPRESSION: No active cardiopulmonary disease.  Cardiomegaly. Electronically Signed   By: Donavan Foil M.D.   On: 09/20/2017 21:57   Ct Head Wo Contrast  Result Date: 09/21/2017 CLINICAL DATA:  Stroke follow-up EXAM: CT HEAD WITHOUT CONTRAST TECHNIQUE: Contiguous axial images were obtained from the base of the skull through the vertex without intravenous contrast. COMPARISON:  CT 09/20/2017 FINDINGS: Brain: Moderate atrophy without hydrocephalus. Extensive chronic microvascular ischemic change in the white matter basal ganglia and cerebellum bilaterally. None of these appear to be acute. Negative for hemorrhage or mass. Vascular: Negative for hyperdense vessel Skull: Negative Sinuses/Orbits: Chronic mucoperiosteal thickening right maxillary sinus. Right cataract surgery Other: None IMPRESSION: No acute abnormality and no change from yesterday. Extensive chronic ischemic changes are stable. Electronically Signed   By: Franchot Gallo M.D.   On: 09/21/2017 17:24   Ct Angio Neck W Or  Wo Contrast  Result Date: 09/20/2017 CLINICAL DATA:  Right-sided weakness EXAM: CT ANGIOGRAPHY HEAD AND NECK TECHNIQUE: Multidetector CT  imaging of the head and neck was performed using the standard protocol during bolus administration of intravenous contrast. Multiplanar CT image reconstructions and MIPs were obtained to evaluate the vascular anatomy. Carotid stenosis measurements (when applicable) are obtained utilizing NASCET criteria, using the distal internal carotid diameter as the denominator. CONTRAST:  49mL ISOVUE-370 IOPAMIDOL (ISOVUE-370) INJECTION 76% COMPARISON:  Head CT 09/20/2017 FINDINGS: CTA NECK FINDINGS AORTIC ARCH: There is moderate calcific atherosclerosis of the aortic arch. There is no aneurysm, dissection or hemodynamically significant stenosis of the visualized ascending aorta and aortic arch. Conventional 3 vessel aortic branching pattern. Moderate atherosclerosis of the proximal subclavian arteries without flow-limiting stenosis. RIGHT CAROTID SYSTEM: --Common carotid artery: Widely patent origin. Moderate mixed density atherosclerosis without hemodynamically significant stenosis. --Internal carotid artery: Predominantly calcified plaque at the proximal right ICA with less than 50% stenosis. Partially retropharyngeal course. --External carotid artery: Severe stenosis of the proximal right ECA due to predominantly calcified plaque. LEFT CAROTID SYSTEM: --Common carotid artery: Widely patent origin without common carotid artery dissection or aneurysm. --Internal carotid artery:No dissection, occlusion or aneurysm. Mild atherosclerotic calcification at the carotid bifurcation without hemodynamically significant stenosis. --External carotid artery: Moderate stenosis proximally due to calcific atherosclerosis. VERTEBRAL ARTERIES: Left dominant configuration. The right vertebral artery is occluded at its origin. This is age indeterminate, but new compared to 08/20/2013. There is calcific gas  pleural sclerosis at the origin of the left vertebral artery, which is otherwise widely patent to the vertebrobasilar confluence. There is opacification of the distal V3 and V4 segments of the right vertebral artery, likely via collateral flow across the PICAs. SKELETON: Multilevel degenerative disc disease and facet hypertrophy without bony spinal canal stenosis. OTHER NECK: Near complete opacification of the right maxillary sinus. Bilateral hypodense thyroid nodules measuring up to 2.1 cm. UPPER CHEST: No pneumothorax or pleural effusion. No nodules or masses. CTA HEAD FINDINGS ANTERIOR CIRCULATION: --Intracranial internal carotid arteries: Normal. --Anterior cerebral arteries: Normal. Both A1 segments are present. --Middle cerebral arteries: Normal. --Posterior communicating arteries: Present bilaterally. POSTERIOR CIRCULATION: --Basilar artery: Normal. --Posterior cerebral arteries: Normal. --Superior cerebellar arteries: Normal. --Inferior cerebellar arteries: Normal anterior and posterior inferior cerebellar arteries. VENOUS SINUSES: As permitted by contrast timing, patent. ANATOMIC VARIANTS: Fetal origins of the posterior cerebellar arteries. DELAYED PHASE: Not performed. Review of the MIP images confirms the above findings. IMPRESSION: 1. No intracranial arterial occlusion or hemodynamically significant stenosis. 2. 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 The V4 segment is opacified via collateral flow. 3. Atherosclerotic calcification at both carotid bifurcations causing less than 50% stenosis of the proximal internal carotid arteries. There is moderate-to-severe stenosis of the proximal external carotid arteries. 4.  Aortic Atherosclerosis (ICD10-I70.0). 5. Thyroid nodules measuring up to 2.1 cm. If not previously obtained, nonemergent thyroid ultrasound is recommended for further assessment. These results were  called by telephone at the time of interpretation on 09/20/2017 at 5:13 pm to Dr. Amie Portland , who verbally acknowledged these results. Electronically Signed   By: Ulyses Jarred M.D.   On: 09/20/2017 17:13   Ct Head Code Stroke Wo Contrast  Result Date: 09/20/2017 CLINICAL DATA:  Code stroke.  Right facial droop and leg weakness. EXAM: CT HEAD WITHOUT CONTRAST TECHNIQUE: Contiguous axial images were obtained from the base of the skull through the vertex without intravenous contrast. COMPARISON:  12/08/2015 FINDINGS: Brain: No convincing acute infarction. Indistinct left putamen is similar to prior and fairly symmetric.  No visible acute cortical infarct. No hemorrhage, hydrocephalus, or masslike finding. There is advanced chronic small vessel ischemia with extensive gliosis in the cerebral white matter. Remote small vessel infarcts in the bilateral cerebellum, bilateral thalamus, left caudate head, and bilateral deep white matter tracts. Vascular: Atherosclerotic calcification. Skull: No acute or aggressive finding. Sinuses/Orbits: Negative Other: These results were communicated to Dr. Rory Percy at 4:49 pmon 4/16/2019by text page via the [facility] messaging system. ASPECTS [facility] Stroke Program Early CT Score) - Ganglionic level infarction (caudate, lentiform nuclei, internal capsule, insula, M1-M3 cortex): 7 - Supraganglionic infarction (M4-M6 cortex): 3 Total score (0-10 with 10 being normal): 10 IMPRESSION: 1. No acute hemorrhage. No definite acute infarct when compared to prior. 2. Advanced chronic small vessel disease with numerous remote small vessel infarcts. Infarcts have become more numerous in the bilateral cerebellum when compared to 2017. Electronically Signed   By: Monte Fantasia M.D.   On: 09/20/2017 16:52    Microbiology: No results found for this or any previous visit (from the past 240 hour(s)).   Labs: Basic Metabolic Panel: Recent Labs  Lab 09/20/17 1633 09/20/17 1645 09/22/17 0529  NA  138 140 138  K 4.2 4.0 4.4  CL 105 102 105  CO2 22  --  27  GLUCOSE 144* 152* 152*  BUN 28* 30* 31*  CREATININE 1.47* 1.40* 1.47*  CALCIUM 10.1  --  9.2   Liver Function Tests: Recent Labs  Lab 09/20/17 1633  AST 26  ALT 20  ALKPHOS 69  BILITOT 1.0  PROT 6.6  ALBUMIN 3.6   No results for input(s): LIPASE, AMYLASE in the last 168 hours. No results for input(s): AMMONIA in the last 168 hours. CBC: Recent Labs  Lab 09/20/17 1633 09/20/17 1645 09/22/17 0529  WBC 11.8*  --  9.2  NEUTROABS 6.8  --   --   HGB 14.7 15.6* 13.0  HCT 45.3 46.0 41.3  MCV 90.8  --  91.4  PLT 216  --  212   Cardiac Enzymes: No results for input(s): CKTOTAL, CKMB, CKMBINDEX, TROPONINI in the last 168 hours. BNP: BNP (last 3 results) No results for input(s): BNP in the last 8760 hours.  ProBNP (last 3 results) No results for input(s): PROBNP in the last 8760 hours.  CBG: Recent Labs  Lab 09/20/17 1639 09/21/17 0623 09/21/17 1213 09/21/17 1613  GLUCAP 144* 172* 148* 209*       Signed:  Suvan Stcyr  [facility] 09/22/2017, 10:18 AM

## 2017-09-22 NOTE — Progress Notes (Signed)
Occupational Therapy Treatment Patient Details Name: [name] MRN: [id number] DOB: 1927-02-12 Today's Date: 09/22/2017    History of present illness 84 y.o. 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  ? Seizure disorder.  Patient was in usual state of health until 1530 on 09/20/17.  Sudden onset of dizziness, abnormal sensation in stomach, severe pain in R leg.  Family noted RSW and facial droop.  Patient brought in as code stroke.   OT comments  Completed education to increase safe DC home with family. Continue to recommend follow up with [place].   Follow Up Recommendations  Home health OT;Supervision/Assistance - 24 hour    Equipment Recommendations  None recommended by OT    Recommendations for Other Services      Precautions / Restrictions Precautions Precautions: Fall Restrictions Weight Bearing Restrictions: No       Mobility Bed Mobility Overal bed mobility: Modified Independent Transfers Overall transfer level: Needs assistance   Transfers: Sit to/from Stand Sit to Stand: Supervision Stand pivot transfers: Supervision            Balance Overall balance assessment: Mild deficits observed, not formally tested                                         ADL either performed or assessed with clinical judgement   ADL                                       Functional mobility during ADLs: Supervision/safety;Rolling walker General ADL Comments: overall set up/S with ADL     Vision   Vision Assessment?: Vision impaired- to be further tested in functional context Additional Comments: vivion deficits from previous CVA   Perception     Praxis      Cognition Arousal/Alertness: Awake/alert Behavior During Therapy: WFL for tasks assessed/performed Overall Cognitive Status: Within Functional Limits for tasks  assessed                                 General Comments: most likely baseline cognition. Daughter confirmed        Exercises     Shoulder Instructions       General Comments      Pertinent Vitals/ Pain       Pain Assessment: No/denies pain  Home Living Family/patient expects to be discharged to:: Private residence Living Arrangements: Spouse/significant other   Type of Home: House Home Access: Stairs to enter Technical brewer of Steps: 1 Entrance Stairs-Rails: Can reach both;Right;Left Home Layout: Two level;Laundry or work area in basement;Able to live on main level with bedroom/bathroom     Bathroom Shower/Tub: Tub/shower unit(Tub has a door she can open so she can walk into the shower)   Bathroom Toilet: Standard(per patient, "comfort height") Bathroom Accessibility: Yes   Home Equipment: Cane - single point;Shower seat - built in;Grab bars - toilet;Grab bars - tub/shower;Hand held shower head      Lives With: Spouse    Prior Functioning/Environment Level of Independence: Independent with assistive device(s)        Comments: single point cane; husband does the driving  Frequency           Progress Toward Goals  OT Goals(current goals can now be found in the care plan section)  Progress towards OT goals: Progressing toward goals  Acute Rehab OT Goals [name] OT Goal Formulation: With patient Time For Goal Achievement: 10/05/17 Potential to Achieve Goals: Good ADL Goals Pt Will Perform Lower Body Bathing: with supervision;with set-up;sit to/from stand Pt Will Perform Lower Body Dressing: with set-up;with supervision;sit to/from stand Pt Will Transfer to Toilet: with set-up;ambulating;with supervision Pt Will Perform Toileting - Clothing Manipulation and hygiene: with supervision;with set-up;sit to/from stand Pt Will Perform Tub/Shower Transfer: Shower transfer;with set-up;with  supervision;ambulating;shower seat  Plan Discharge plan remains appropriate    Co-evaluation                 AM-PAC PT "6 Clicks" Daily Activity     Outcome Measure   Help from another person eating meals?: None Help from another person taking care of personal grooming?: None Help from another person toileting, which includes using toliet, bedpan, or urinal?: A Little Help from another person bathing (including washing, rinsing, drying)?: A Little Help from another person to put on and taking off regular upper body clothing?: None Help from another person to put on and taking off regular lower body clothing?: A Little 6 Click Score: 21    End of Session    OT Visit Diagnosis: Unsteadiness on feet (R26.81);Muscle weakness (generalized) (M62.81);Dizziness and giddiness (R42);Pain   Activity Tolerance Patient tolerated treatment well   Patient Left in bed;with call bell/phone within reach;with family/visitor present   Nurse Communication Mobility status        Time: 4462-8638 OT Time Calculation (min): 15 min  Charges: OT General Charges $OT Visit: 1 Visit OT Treatments $Self Care/Home Management : 8-22 mins  [facility], OT/L  [phone] 09/22/2017   Mysti Haley,HILLARY 09/22/2017, 3:30 PM

## 2017-09-22 NOTE — Consult Note (Signed)
            [facility] Primary Care Navigator  09/22/2017  VERSIA MIGNOGNA 1927-05-05 [id number]   Wentto seepatient in the room to identify possible discharge needs but she was alreadydischargedper staff report.  Per chart review, patient went home today with home health services. She was admitted for evaluation of right-sided weakness, sudden onset of dizziness (TIA).  Primary care provider's officeis listed as doing transition of care (TOC) follow-up.  Patient has a discharge instruction to follow-up with primary care provider within1 week.   For additional questions please contact:  Edwena Felty A. Kasiya Burck, BSN, RN-BC [facility] PRIMARY CARE Navigator Cell: [phone]

## 2017-09-22 NOTE — Progress Notes (Signed)
Physical Therapy Treatment/Vestibular Assessment Patient Details Name: [name] MRN: [id number] DOB: 03/16/1927 Today's Date: 09/22/2017    History of Present Illness 84 y.o. 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  ? Seizure disorder.  Patient was in usual state of health until 1530 on 09/20/17.  Sudden onset of dizziness, abnormal sensation in stomach, severe pain in R leg.  Family noted RSW and facial droop.  Patient brought in as code stroke.    PT Comments    Today, pt has no signs of vestibular dysfunction.  Her report of her episode sounds very vertiginous in nature, however, all of her testing today was negative for central or peripheral pathology.  She would still benefit from home therapy follow up.   Follow Up Recommendations  Home health PT;Supervision for mobility/OOB(vestibular not needed normal HH is fine)     Equipment Recommendations  None recommended by PT    Recommendations for Other Services   NA     Precautions / Restrictions Precautions Precautions: Fall Restrictions Weight Bearing Restrictions: No    Mobility  Bed Mobility Overal bed mobility: Needs Assistance Bed Mobility: Sit to Sidelying;Sidelying to Sit   Sidelying to sit: Min assist     Sit to sidelying: Min assist General bed mobility comments: Min assist as we were doing sidelying vestibular testing.       Balance         09/22/17 1338  Vestibular Assessment  General Observation PT reports no h/o recent URI, no head trauma, no whiplash injury, has no vision in L eye, wears bifocals, gets yearly vision checks, has had corrective eye surgery for cataracts in R eye, no reports of vision changes, no blurry vision or double vision, no tinnitus, no fullness, no hearing issues, was started on prednisone just PTA.   Symptom Behavior  Type of Dizziness Spinning  Frequency  of Dizziness it was near constant initially   Duration of Dizziness hours  Aggravating Factors Activity in general  Relieving Factors Rest  Occulomotor Exam  Occulomotor Alignment Normal  Spontaneous Absent  Gaze-induced Absent  Head shaking Horizontal Absent  Smooth Pursuits Intact  Saccades Dysmetria;Comment (very mild undershoting)  Vestibulo-Occular Reflex  VOR 1 Head Only (x 1 viewing) normal vertical and horizontal  VOR Cancellation Normal  Comment HIT negative bil  Auditory  Comments grossly equal bil  Positional Testing  Sidelying Test Sidelying Right;Sidelying Left  Sidelying Right  Sidelying Right Duration 0  Sidelying Right Symptoms No nystagmus  Sidelying Left  Sidelying Left Duration 0  Sidelying Left Symptoms No nystagmus                                         Cognition Arousal/Alertness: Awake/alert Behavior During Therapy: WFL for tasks assessed/performed                                   General Comments: Pt seems socially reliant on her husband to fill in the blanks.  DId not inquire to if this was baseline.               Pertinent Vitals/Pain Pain Assessment: No/denies pain    Home Living Family/patient expects to be discharged to:: Private residence  Living Arrangements: Spouse/significant other   Type of Home: House Home Access: Stairs to enter Entrance Stairs-Rails: Can reach both;Right;Left Home Layout: Two level;Laundry or work area in basement;Able to live on main level with bedroom/bathroom Home Equipment: Kasandra Knudsen - single point;Shower seat - built in;Grab bars - toilet;Grab bars - tub/shower;Hand held shower head      Prior Function Level of Independence: Independent with assistive device(s)      Comments: single point cane; husband does the driving   PT Goals (current goals can now be found in the care plan section) Acute Rehab PT Goals [name] Progress towards PT goals:  Progressing toward goals    Frequency    Min 3X/week      PT Plan Current plan remains appropriate       AM-PAC PT "6 Clicks" Daily Activity  Outcome Measure  Difficulty turning over in bed (including adjusting bedclothes, sheets and blankets)?: A Little Difficulty moving from lying on back to sitting on the side of the bed? : A Little Difficulty sitting down on and standing up from a chair with arms (e.g., wheelchair, bedside commode, etc,.)?: A Little Help needed moving to and from a bed to chair (including a wheelchair)?: A Little Help needed walking in hospital room?: A Little Help needed climbing 3-5 steps with a railing? : A Lot 6 Click Score: 17    End of Session   Activity Tolerance: Patient tolerated treatment well Patient left: in bed;Other (comment)(seated EOB with OT coming in)   PT Visit Diagnosis: Unsteadiness on feet (R26.81);Muscle weakness (generalized) (M62.81)     Time: 9444-6190 PT Time Calculation (min) (ACUTE ONLY): 30 min  Charges:  $Therapeutic Activity: 23-37 mins          [place] Hert B. Bernie, [place], DPT [phone]            09/22/2017, 1:42 PM

## 2017-09-22 NOTE — Progress Notes (Signed)
Nurse went over discharge with patient and family. Patient and family Verbalized understanding of discharge. All questions and concerns addressed. Discharging home. Taken down in a wheelchair.

## 2017-09-22 NOTE — Care Management Note (Signed)
Case Management Note  Patient Details  Name: [name] MRN: [id number] Date of Birth: 04/16/1927  Subjective/Objective:     Pt admitted with acute ischemic stroke. She is from home with her spouse.               Action/Plan: Pt discharging home with HH orders. CM met with the patient and her spouse and provided choice of HH agencies. She selected [facility]. Donna with [facility] notified and accepted the referral. Pt has transportation home and 24 hour supervision at home.   Expected Discharge Date:  09/22/17               Expected Discharge Plan:  Home w Home Health Services  In-House Referral:     Discharge planning Services  CM Consult  Post Acute Care Choice:  Home Health Choice offered to:  Patient, Spouse  DME Arranged:    DME Agency:     HH Arranged:  PT, OT(vestibular) HH Agency:  [facility]  Status of Service:  Completed, signed off  If discussed at Long Length of Stay Meetings, dates discussed:    Additional Comments:   F , RN 09/22/2017, 11:05 AM  

## 2017-09-22 NOTE — Telephone Encounter (Signed)
Nurse's-patient recently discharged from the hospital. Please call patient, let them know that we are aware that they were discharged from the hospital. Please schedule them to follow-up with [place] within the next 7 days. Advised the patient to bring all of their medications with him to the visit. Please inquire if they are having any acute issues currently and documented accordingly. Patient will need follow-up office visit next week bring all medications with her to the visit

## 2017-09-29 ENCOUNTER — Other Ambulatory Visit: Payer: Self-pay | Admitting: Family Medicine

## 2017-09-29 ENCOUNTER — Encounter: Payer: Self-pay | Admitting: Family Medicine

## 2017-09-29 ENCOUNTER — Ambulatory Visit (INDEPENDENT_AMBULATORY_CARE_PROVIDER_SITE_OTHER): Payer: Medicare HMO | Admitting: Family Medicine

## 2017-09-29 VITALS — BP 130/64 | Ht 62.0 in | Wt 195.0 lb

## 2017-09-29 DIAGNOSIS — E041 Nontoxic single thyroid nodule: Secondary | ICD-10-CM | POA: Diagnosis not present

## 2017-09-29 MED ORDER — MECLIZINE HCL 25 MG PO TABS: 25.0000 mg | ORAL_TABLET | Freq: Three times a day (TID) | ORAL | 2 refills | Status: DC | PRN

## 2017-09-29 NOTE — Progress Notes (Signed)
   Subjective:    Patient ID: [name], female    DOB: [birth year], 84 y.o.   MRN: [id number]  HPIFollow up hospitalization for stroke. Right leg still hurting.  Patient had an episode that she passed out she also had in-depth evaluation in the hospital for possible stroke because she had short-term right-sided weakness it was designated that she may have had a TIA she is already on warfarin her INR is therapeutic she does relate intermittent dizziness Would like refill on meclizine.  No evidence of a stroke currently She does describe intermittent low back pain intermittent right leg pain but no weakness in the leg Also work-up in the hospital did show thyroid nodule ultrasound recommended   Review of Systems  Constitutional: Negative for activity change and appetite change.  HENT: Negative for congestion.   Respiratory: Negative for cough.   Cardiovascular: Negative for chest pain.  Gastrointestinal: Negative for abdominal pain and vomiting.  Skin: Negative for color change.  Neurological: Negative for weakness.  Psychiatric/Behavioral: Negative for confusion.       Objective:   Physical Exam  Constitutional: She appears well-nourished. No distress.  HENT:  Head: Normocephalic.  Right Ear: External ear normal.  Left Ear: External ear normal.  Eyes: Right eye exhibits no discharge. Left eye exhibits no discharge.  Neck: No tracheal deviation present.  Cardiovascular: Normal rate, regular rhythm and normal heart sounds.  No murmur heard. Pulmonary/Chest: Effort normal and breath sounds normal. No respiratory distress. She has no wheezes. She has no rales.  Musculoskeletal: She exhibits no edema.  Lymphadenopathy:    She has no cervical adenopathy.  Neurological: She is alert.  Psychiatric: Her behavior is normal.  Vitals reviewed.   No unilateral weakness No neck mass felt       Assessment & Plan:  Continue Coumadin Risk factors under good control No  evidence of stroke Thyroid nodule not felt on today's exam by ultrasound ordered  More than likely follow-up patient in 3 to 4 months Has intermittent back and leg pain I believe is related to a pinched nerve but I do not feel the patient can undergo an injection because of her Coumadin use Tylenol for now  Patient has had long-standing dizziness she requests meclizine refill uses this intermittently

## 2017-09-30 ENCOUNTER — Telehealth: Payer: Self-pay

## 2017-09-30 NOTE — Telephone Encounter (Signed)
-----   Message from Garvin Fila, MD sent at 09/30/2017 11:27 AM EDT ----- [name] inform the patient that lower extremity venous dopplers show no evidence of deep vein thrombosis.

## 2017-09-30 NOTE — Telephone Encounter (Signed)
Notes recorded by Marval Regal, RN on 09/30/2017 at 12:37 PM EDT Left vm for patient to call back about vas [place] lower results. ------

## 2017-10-03 NOTE — Telephone Encounter (Signed)
Rn call pts listed number she was not at home. The person stated she was not at home.Rn stated a call will be made tomorrow. ------

## 2017-10-03 NOTE — Telephone Encounter (Signed)
Rn return patients call. Rn stated the venous dopplers showed no evidence of deep vein thrombosis which is blood clots. Pt had test in hospital when Dr.Sethi saw her. PT verbalized understanding.  PEr Dr.SEthi pt needs no follow up with him at [facility] office.

## 2017-10-03 NOTE — Telephone Encounter (Signed)
Pt has returned the call to RN Katrina, pt is asking for a call back please

## 2017-10-04 ENCOUNTER — Ambulatory Visit (HOSPITAL_COMMUNITY)
Admission: RE | Admit: 2017-10-04 | Discharge: 2017-10-04 | Disposition: A | Discharge status: Home / Self Care | Payer: Medicare HMO | Source: Ambulatory Visit | Attending: Family Medicine | Admitting: Family Medicine

## 2017-10-04 ENCOUNTER — Encounter: Payer: Self-pay | Admitting: Family Medicine

## 2017-10-04 DIAGNOSIS — E041 Nontoxic single thyroid nodule: Secondary | ICD-10-CM

## 2017-10-04 DIAGNOSIS — E042 Nontoxic multinodular goiter: Secondary | ICD-10-CM | POA: Diagnosis not present

## 2017-10-05 ENCOUNTER — Ambulatory Visit (INDEPENDENT_AMBULATORY_CARE_PROVIDER_SITE_OTHER): Payer: Medicare HMO | Admitting: *Deleted

## 2017-10-05 DIAGNOSIS — I639 Cerebral infarction, unspecified: Secondary | ICD-10-CM

## 2017-10-05 DIAGNOSIS — I495 Sick sinus syndrome: Secondary | ICD-10-CM

## 2017-10-05 DIAGNOSIS — Z5181 Encounter for therapeutic drug level monitoring: Secondary | ICD-10-CM

## 2017-10-05 LAB — POCT INR: INR: 6.7

## 2017-10-05 NOTE — Patient Instructions (Addendum)
Pt has been taking 1/2 tablet daily except 1 tablet on Tuesdays (Children were fixing med)  Suppose to be on 1/2 tablet daily Hold coumadin x 4 days then restart 1/2 tablet daily Recheck in 1 week Bleeding and fall precautions discussed with pt and she verbalized understanding

## 2017-10-10 ENCOUNTER — Ambulatory Visit (INDEPENDENT_AMBULATORY_CARE_PROVIDER_SITE_OTHER): Payer: Medicare HMO | Admitting: *Deleted

## 2017-10-10 DIAGNOSIS — I495 Sick sinus syndrome: Secondary | ICD-10-CM | POA: Diagnosis not present

## 2017-10-10 NOTE — Progress Notes (Signed)
Remote pacemaker transmission.   

## 2017-10-12 ENCOUNTER — Ambulatory Visit (INDEPENDENT_AMBULATORY_CARE_PROVIDER_SITE_OTHER): Payer: Medicare HMO | Admitting: *Deleted

## 2017-10-12 ENCOUNTER — Encounter: Payer: Self-pay | Admitting: Cardiology

## 2017-10-12 DIAGNOSIS — I639 Cerebral infarction, unspecified: Secondary | ICD-10-CM | POA: Diagnosis not present

## 2017-10-12 DIAGNOSIS — I495 Sick sinus syndrome: Secondary | ICD-10-CM

## 2017-10-12 DIAGNOSIS — Z5181 Encounter for therapeutic drug level monitoring: Secondary | ICD-10-CM

## 2017-10-12 DIAGNOSIS — I4891 Unspecified atrial fibrillation: Secondary | ICD-10-CM | POA: Diagnosis not present

## 2017-10-12 LAB — POCT INR: INR: 1.8

## 2017-10-12 NOTE — Patient Instructions (Signed)
Take coumadin 1 tablet tonight then resume 1/2 tablet daily Recheck in 3 weeks

## 2017-10-21 ENCOUNTER — Other Ambulatory Visit: Payer: Self-pay | Admitting: Family Medicine

## 2017-10-24 LAB — CUP PACEART REMOTE DEVICE CHECK
Battery Remaining Percentage: 40 %
Battery Voltage: 2.84 V
Implantable Lead Location: 753860
Lead Channel Impedance Value: 410 Ohm
Lead Channel Pacing Threshold Pulse Width: 0.4 ms
Lead Channel Sensing Intrinsic Amplitude: 12 mV
Lead Channel Setting Pacing Pulse Width: 0.4 ms
MDC IDC LEAD IMPLANT DT: 20100602
MDC IDC LEAD IMPLANT DT: 20100602
MDC IDC LEAD LOCATION: 753859
MDC IDC MSMT BATTERY REMAINING LONGEVITY: 49 mo
MDC IDC MSMT LEADCHNL RV PACING THRESHOLD AMPLITUDE: 0.5 V
MDC IDC PG IMPLANT DT: 20100602
MDC IDC SESS DTM: 20190506073805
MDC IDC SET LEADCHNL RV PACING AMPLITUDE: 2.5 V
MDC IDC SET LEADCHNL RV SENSING SENSITIVITY: 2 mV
MDC IDC STAT BRADY RV PERCENT PACED: 65 %
Pulse Gen Serial Number: 2323973

## 2017-10-27 ENCOUNTER — Other Ambulatory Visit: Payer: Self-pay | Admitting: Family Medicine

## 2017-11-04 ENCOUNTER — Ambulatory Visit (INDEPENDENT_AMBULATORY_CARE_PROVIDER_SITE_OTHER): Payer: Medicare HMO | Admitting: *Deleted

## 2017-11-04 DIAGNOSIS — Z5181 Encounter for therapeutic drug level monitoring: Secondary | ICD-10-CM | POA: Diagnosis not present

## 2017-11-04 DIAGNOSIS — I495 Sick sinus syndrome: Secondary | ICD-10-CM

## 2017-11-04 DIAGNOSIS — I4891 Unspecified atrial fibrillation: Secondary | ICD-10-CM | POA: Diagnosis not present

## 2017-11-04 DIAGNOSIS — I639 Cerebral infarction, unspecified: Secondary | ICD-10-CM | POA: Diagnosis not present

## 2017-11-04 LAB — POCT INR: INR: 2.5 (ref 2.0–3.0)

## 2017-11-04 NOTE — Patient Instructions (Signed)
Continue coumadin 1/2 tablet daily Recheck in 4 weeks  

## 2017-11-08 ENCOUNTER — Ambulatory Visit (INDEPENDENT_AMBULATORY_CARE_PROVIDER_SITE_OTHER): Payer: Medicare HMO | Admitting: Family Medicine

## 2017-11-08 ENCOUNTER — Encounter: Payer: Self-pay | Admitting: Family Medicine

## 2017-11-08 VITALS — BP 130/76 | Ht 62.0 in | Wt 198.8 lb

## 2017-11-08 DIAGNOSIS — N289 Disorder of kidney and ureter, unspecified: Secondary | ICD-10-CM

## 2017-11-08 DIAGNOSIS — I10 Essential (primary) hypertension: Secondary | ICD-10-CM

## 2017-11-08 DIAGNOSIS — M1000 Idiopathic gout, unspecified site: Secondary | ICD-10-CM | POA: Diagnosis not present

## 2017-11-08 DIAGNOSIS — E041 Nontoxic single thyroid nodule: Secondary | ICD-10-CM | POA: Diagnosis not present

## 2017-11-08 DIAGNOSIS — E785 Hyperlipidemia, unspecified: Secondary | ICD-10-CM

## 2017-11-08 NOTE — Progress Notes (Signed)
   Subjective:    Patient ID: [name], female    DOB: 21-Mar-1927, 84 y.o.   MRN: [id number]  HPI Patient arrives for a follow up on recent hospitalization. Patient comes in today she had recently had a stroke she is now doing better in addition to this she is also has atrial fibrillation on Coumadin doing well with no bleeding problems In addition to this recent thyroid ultrasound which showed most of the nodules not big enough for biopsy one nodule should be followed up again in approximately 1 year  She also had a flareup of gout over the past week but has not been on any medication recently  Review of Systems  Constitutional: Negative for activity change, appetite change and fatigue.  HENT: Negative for congestion.   Respiratory: Negative for cough.   Cardiovascular: Negative for chest pain.  Gastrointestinal: Negative for abdominal pain.  Endocrine: Negative for polydipsia and polyphagia.  Skin: Negative for color change.  Neurological: Negative for weakness.  Psychiatric/Behavioral: Negative for confusion.       Objective:   Physical Exam  Constitutional: She appears well-nourished. No distress.  Cardiovascular: Normal rate and normal heart sounds.  No murmur heard. Pulmonary/Chest: Effort normal and breath sounds normal. No respiratory distress.  Musculoskeletal: She exhibits no edema.  Lymphadenopathy:    She has no cervical adenopathy.  Neurological: She is alert. She exhibits normal muscle tone.  Psychiatric: Her behavior is normal.  Vitals reviewed.         Assessment & Plan:  Sciatica-resolved  Recent stroke making good recovery with no obvious issues with ambulating uses her cane to do so  Thyroid nodule only one meets the criteria to repeat this again so we will do ultrasound at the end of next April  Lab work indicated recent flareup of gout patient will follow-up with [place] in 3 to 4 months

## 2017-11-09 NOTE — Telephone Encounter (Signed)
Refuses medication patient was instructed to do lab work first

## 2017-11-11 ENCOUNTER — Other Ambulatory Visit: Payer: Self-pay | Admitting: Family Medicine

## 2017-11-11 ENCOUNTER — Ambulatory Visit: Payer: Medicare HMO | Admitting: Family Medicine

## 2017-11-11 ENCOUNTER — Other Ambulatory Visit: Payer: Self-pay | Admitting: Internal Medicine

## 2017-11-11 DIAGNOSIS — I10 Essential (primary) hypertension: Secondary | ICD-10-CM | POA: Diagnosis not present

## 2017-11-11 DIAGNOSIS — M1000 Idiopathic gout, unspecified site: Secondary | ICD-10-CM | POA: Diagnosis not present

## 2017-11-11 DIAGNOSIS — E785 Hyperlipidemia, unspecified: Secondary | ICD-10-CM | POA: Diagnosis not present

## 2017-11-11 DIAGNOSIS — N289 Disorder of kidney and ureter, unspecified: Secondary | ICD-10-CM | POA: Diagnosis not present

## 2017-11-11 NOTE — Telephone Encounter (Signed)
This is a [place] pt.  °

## 2017-11-12 LAB — LIPID PANEL
CHOL/HDL RATIO: 2.3 ratio (ref 0.0–4.4)
CHOLESTEROL TOTAL: 114 mg/dL (ref 100–199)
HDL: 50 mg/dL (ref >39)
LDL CALC: 35 mg/dL (ref 0–99)
TRIGLYCERIDES: 144 mg/dL (ref 0–149)
VLDL Cholesterol Cal: 29 mg/dL (ref 5–40)

## 2017-11-12 LAB — BASIC METABOLIC PANEL
BUN / CREAT RATIO: 11 — AB (ref 12–28)
BUN: 12 mg/dL (ref 8–27)
CALCIUM: 10.4 mg/dL — AB (ref 8.7–10.3)
CHLORIDE: 106 mmol/L (ref 96–106)
CO2: 23 mmol/L (ref 20–29)
Creatinine, Ser: 1.14 mg/dL — ABNORMAL HIGH (ref 0.57–1.00)
GFR, EST AFRICAN AMERICAN: 50 mL/min/{1.73_m2} — AB (ref >59)
GFR, EST NON AFRICAN AMERICAN: 43 mL/min/{1.73_m2} — AB (ref >59)
Glucose: 128 mg/dL — ABNORMAL HIGH (ref 65–99)
POTASSIUM: 4.2 mmol/L (ref 3.5–5.2)
Sodium: 145 mmol/L — ABNORMAL HIGH (ref 134–144)

## 2017-11-12 LAB — URIC ACID: URIC ACID: 6.4 mg/dL (ref 2.5–7.1)

## 2017-11-15 NOTE — Telephone Encounter (Signed)
May have 4 refills on these

## 2017-12-05 ENCOUNTER — Ambulatory Visit (INDEPENDENT_AMBULATORY_CARE_PROVIDER_SITE_OTHER): Payer: Medicare HMO | Admitting: *Deleted

## 2017-12-05 DIAGNOSIS — I639 Cerebral infarction, unspecified: Secondary | ICD-10-CM | POA: Diagnosis not present

## 2017-12-05 DIAGNOSIS — I4891 Unspecified atrial fibrillation: Secondary | ICD-10-CM | POA: Diagnosis not present

## 2017-12-05 DIAGNOSIS — I495 Sick sinus syndrome: Secondary | ICD-10-CM | POA: Diagnosis not present

## 2017-12-05 DIAGNOSIS — Z5181 Encounter for therapeutic drug level monitoring: Secondary | ICD-10-CM

## 2017-12-05 LAB — POCT INR: INR: 2.3 (ref 2.0–3.0)

## 2017-12-05 NOTE — Patient Instructions (Signed)
Continue coumadin 1/2 tablet daily Recheck in 4 weeks

## 2017-12-06 ENCOUNTER — Ambulatory Visit (INDEPENDENT_AMBULATORY_CARE_PROVIDER_SITE_OTHER): Payer: Medicare HMO | Admitting: Family Medicine

## 2017-12-06 ENCOUNTER — Encounter: Payer: Self-pay | Admitting: Family Medicine

## 2017-12-06 VITALS — BP 132/84 | Ht 62.0 in | Wt 198.0 lb

## 2017-12-06 DIAGNOSIS — I4891 Unspecified atrial fibrillation: Secondary | ICD-10-CM

## 2017-12-06 DIAGNOSIS — R51 Headache: Secondary | ICD-10-CM

## 2017-12-06 DIAGNOSIS — R519 Headache, unspecified: Secondary | ICD-10-CM

## 2017-12-06 NOTE — Progress Notes (Signed)
   Subjective:    Patient ID: [name], female    DOB: [birth year], 84 y.o.   MRN: [id number]  HPI Patient arrives with headache and vertigo for a week Testing from earlier this year reviewed including CAT scan and vascular studies Patient having some intermittent sharp headaches in the back of her head on the right posterior aspect the occiput region she also has intermittent dizziness that she describes as more of a feeling that sometimes the room spins other times she feels unsteady she is not experiencing any today she uses meclizine occasionally  Review of Systems  Constitutional: Negative for activity change, appetite change and fatigue.  HENT: Negative for congestion.   Respiratory: Negative for cough.   Cardiovascular: Negative for chest pain.  Gastrointestinal: Negative for abdominal pain.  Endocrine: Negative for polydipsia and polyphagia.  Skin: Negative for color change.  Neurological: Negative for weakness.  Psychiatric/Behavioral: Negative for confusion.       Objective:   Physical Exam Patient is able to stand without difficulty she walks with a cane she is not experiencing any vertigo currently her lungs are clear no crackles heart irregular rate is controlled extremities no edema skin warm dry no obvious evidence of stroke  15 minutes was spent with patient today discussing healthcare issues which they came.  More than 50% of this visit-total duration of visit-was spent in counseling and coordination of care.  Please see diagnosis regarding the focus of this coordination and care      Assessment & Plan:  Frequent occipital headaches Could be a nerve impingement Patient will do a headache diary over the next month She will follow-up in 1 month Had a recent CAT scan 2019 did not show tumor She does have some vertebral artery circulation issues which could be contributing to her vertigo if this gets worse may need neurology consultation I do not feel ENT  would be able to offer much

## 2017-12-15 ENCOUNTER — Other Ambulatory Visit: Payer: Self-pay | Admitting: Family Medicine

## 2017-12-22 ENCOUNTER — Encounter (HOSPITAL_COMMUNITY): Payer: Self-pay | Admitting: Emergency Medicine

## 2017-12-22 ENCOUNTER — Emergency Department (HOSPITAL_COMMUNITY)
Admission: EM | Admit: 2017-12-22 | Discharge: 2017-12-22 | Disposition: A | Discharge status: Home / Self Care | Payer: Medicare HMO | Attending: Emergency Medicine | Admitting: Emergency Medicine

## 2017-12-22 ENCOUNTER — Emergency Department (HOSPITAL_COMMUNITY): Payer: Medicare HMO

## 2017-12-22 ENCOUNTER — Other Ambulatory Visit: Payer: Self-pay

## 2017-12-22 DIAGNOSIS — E119 Type 2 diabetes mellitus without complications: Secondary | ICD-10-CM | POA: Insufficient documentation

## 2017-12-22 DIAGNOSIS — R51 Headache: Secondary | ICD-10-CM

## 2017-12-22 DIAGNOSIS — Z8673 Personal history of transient ischemic attack (TIA), and cerebral infarction without residual deficits: Secondary | ICD-10-CM | POA: Diagnosis not present

## 2017-12-22 DIAGNOSIS — I10 Essential (primary) hypertension: Secondary | ICD-10-CM | POA: Diagnosis not present

## 2017-12-22 DIAGNOSIS — Z79899 Other long term (current) drug therapy: Secondary | ICD-10-CM | POA: Diagnosis not present

## 2017-12-22 DIAGNOSIS — Z7901 Long term (current) use of anticoagulants: Secondary | ICD-10-CM | POA: Insufficient documentation

## 2017-12-22 DIAGNOSIS — M542 Cervicalgia: Secondary | ICD-10-CM | POA: Diagnosis not present

## 2017-12-22 DIAGNOSIS — R519 Headache, unspecified: Secondary | ICD-10-CM

## 2017-12-22 DIAGNOSIS — G43809 Other migraine, not intractable, without status migrainosus: Secondary | ICD-10-CM | POA: Diagnosis not present

## 2017-12-22 LAB — PROTIME-INR
INR: 2.15
PROTHROMBIN TIME: 23.8 s — AB (ref 11.4–15.2)

## 2017-12-22 MED ORDER — ACETAMINOPHEN 325 MG PO TABS
650.0000 mg | ORAL_TABLET | Freq: Once | ORAL | Status: AC
  Administered 2017-12-22: 650 mg via ORAL
  Filled 2017-12-22: qty 2

## 2017-12-22 NOTE — ED Provider Notes (Signed)
[facility] Provider Note   CSN: [id number] Arrival date & time: 12/22/17  1713     History   Chief Complaint Chief Complaint  Patient presents with  . Headache    HPI [name] is a 84 y.o. female.  HPI Patient presents with headache on and off for the last 4 days.  Reviewing records she has had it longer than this and is seen her primary care doctor for it.  It is dull in the back side of her right head.  Will sometimes go down her neck and sometimes up further.  No vision changes.  No numbness weakness.  No confusion.  She is on Coumadin for A. fib and previous strokes.  Has had some mild dizziness at times.  This also is not that unusual for her. Past Medical History:  Diagnosis Date  . Anemia    H/H of 11.3/36.7 in 12/2008 ;normal MCV; normal CBC in 2011  . Angioedema 08/2006  . Atrial fibrillation (HCC)    persistant  . Blood transfusion   . 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 Chronic bronchitis   . Chronic renal insufficiency    baseline creatine 1.4; 1.04 in 03/2010  . DJD (degenerative joint disease)    hands, knees  . Gout   . Hyperlipidemia   . Hypertension    heart disease diastolic dysfunction ; ZY-60%; mild pulmonary edema in 2006;responded to diurectics ; LVH  . Kidney stones   . Mitral valve disease    Severe mitral annular calcification; mild to moderate stenosis-not clearly rheumatic  . Nephrolithiasis   . Pacemaker   . Retinal hemorrhage   . Seizure disorder ([place])   . Shortness of breath    only with exertion  . Stroke ([place])   . Tachycardia-bradycardia syndrome ([place])    with pauses and syncope;PPM implantation 10/2008,negative stress nuclear study 03/2005  . Vertigo     Patient Active Problem List   Diagnosis Date Noted  . Thyroid nodule 10/04/2017  . Right sided weakness   . Acute ischemic stroke ([place]) 09/20/2017  . Controlled type 2 diabetes mellitus with complication, without long-term current use of insulin ([place]) 07/08/2016  . Prediabetes 12/25/2015  . 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 Vitreous hemorrhage ([place]) 02/12/2014  . TIA (transient ischemic attack) 08/20/2013  . Encounter for therapeutic drug monitoring 06/28/2013  . Gout 11/14/2012  . Fasting hyperglycemia 04/06/2012  . Atrial fibrillation ([place]) 08/31/2011  . Chronic anticoagulation 12/16/2010  . 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 Cerebrovascular accident ([place])   . DJD (degenerative joint disease)   . Hypertension 02/21/2009  . Sick sinus syndrome ([place]) 02/21/2009  . Hyperlipidemia 11/26/2008  . SEIZURE DISORDER 10/31/2008    Past Surgical History:  Procedure Laterality Date  . CATARACT EXTRACTION W/PHACO  03/29/2011   Procedure: CATARACT EXTRACTION PHACO AND INTRAOCULAR LENS PLACEMENT (IOC);  Surgeon: Tonny Branch;  Location: [facility];  Service: Ophthalmology;  Laterality: Right;  CDE 12.62  . INSERT / REPLACE / REMOVE PACEMAKER  11/2008   St.Jude DUAL chamber pacemaker 11/06/2008  . MEMBRANE PEEL Right 02/12/2014   Procedure: MEMBRANE PEEL;  Surgeon: [name], MD;  Location: [place];  Service: Ophthalmology;  Laterality: Right;  . PARS PLANA VITRECTOMY Right 02/12/2014   Procedure: PARS PLANA VITRECTOMY  WITH 25 GAUGE;  Surgeon: [name], MD;  Location: [place];  Service: Ophthalmology;  Laterality: Right;  . TOTAL KNEE ARTHROPLASTY  04/2009   Right     OB History    Gravida  5   Para  5   Term  5   Preterm      AB      Living  5     SAB      TAB      Ectopic      Multiple      Live Births               Home Medications    Prior to Admission medications   Medication Sig Start Date End Date Taking? Authorizing Provider  albuterol (VENTOLIN HFA) 108 (90 Base) MCG/ACT inhaler  TAKE 2 PUFFS EVERY 6 HOURS AS NEEDED FOR WHEEZING 09/29/17   Luking, Elayne Snare, MD  allopurinol (ZYLOPRIM) 100 MG tablet TAKE 1 TABLET BY MOUTH EVERY MONDAY, WEDNESDAY, AND FRIDAY. 11/16/17   Kathyrn Drown, MD  diltiazem (TIAZAC) 360 MG 24 hr capsule TAKE ONE CAPSULE BY MOUTH ONCE DAILY. 11/16/16   Evans Lance, MD  dorzolamide-timolol (COSOPT) 22.3-6.8 MG/ML ophthalmic solution Place 1 drop into both eyes at bedtime.     [provider]  furosemide (LASIX) 20 MG tablet TAKE (1) TABLET BY MOUTH ONCE DAILY. 09/14/17   Kathyrn Drown, MD  lisinopril (PRINIVIL,ZESTRIL) 20 MG tablet TAKE (1) TABLET BY MOUTH ONCE DAILY. 07/06/17   Evans Lance, MD  meclizine (ANTIVERT) 25 MG tablet Take 1 tablet (25 mg total) by mouth 3 (three) times daily as needed. Dizziness 09/29/17   Kathyrn Drown, MD  pravastatin (PRAVACHOL) 80 MG tablet TAKE (1) TABLET BY MOUTH AT BEDTIME. 09/14/17   Luking, Elayne Snare, MD  ranitidine (ZANTAC) 150 MG tablet TAKE (1) TABLET BY MOUTH TWICE DAILY. 12/15/17   Kathyrn Drown, MD  warfarin (COUMADIN) 5 MG tablet Take 2.5-5 mg by mouth See admin instructions. Take 1/2 tablet on Wednesday and Thursday then take 1 tablet all the other days    [provider]  colchicine 0.6 MG tablet Take 0.6 mg by mouth daily as needed. Gout Flare Ups  08/31/11  [provider]  diltiazem (CARDIZEM CD) 360 MG 24 hr capsule TAKE (1) CAPSULE BY MOUTH ONCE DAILY. 08/12/11 08/31/11  Yehuda Savannah, MD  simvastatin (ZOCOR) 40 MG tablet Take 40 mg by mouth at bedtime.    08/31/11  [provider]    Family History Family History  Problem Relation Age of Onset  . Cancer Other        unknown cancer  . Hypotension Neg Hx   . Anesthesia problems Neg Hx   . Pseudochol deficiency Neg Hx   . Malignant hyperthermia Neg Hx     Social History Social History   Tobacco Use  . Smoking status: Never Smoker  . Smokeless tobacco: Never Used  Substance Use Topics  . Alcohol use:  No    Alcohol/week: 0.0 oz  . Drug use: No     Allergies   Tramadol   Review of Systems Review of Systems  Constitutional: Negative for appetite change.  HENT: Negative for congestion.   Eyes: Negative for visual disturbance.  Cardiovascular: Negative for chest pain.  Gastrointestinal: Negative for abdominal pain.  Genitourinary: Negative for flank pain and frequency.  Musculoskeletal: Positive for neck pain.  Skin: Negative for rash.  Neurological: Positive  for headaches.  Hematological: Negative for adenopathy.  Psychiatric/Behavioral: Negative for confusion.     Physical Exam Updated Vital Signs BP (!) 172/50 (BP Location: Right Arm)   Pulse 66   Temp 98.2 F (36.8 C) (Oral)   Resp 16   Ht 5\' 2"  (1.575 m)   Wt 89.8 kg (198 lb)   SpO2 94%   BMI 36.21 kg/m   Physical Exam  Constitutional: She is oriented to person, place, and time. She appears well-developed.  HENT:  Head: Normocephalic.  Eyes:  Patient is chronically decreased vision in left eye.  Neck: Neck supple.   mild tenderness over superior musculature of right side of neck.  Cardiovascular: Normal rate.  Pulmonary/Chest: Breath sounds normal.  Abdominal: Soft.  Musculoskeletal: Normal range of motion.  Neurological: She is alert and oriented to person, place, and time. She has normal strength.     ED Treatments / Results  Labs (all labs ordered are listed, but only abnormal results are displayed) Labs Reviewed  PROTIME-INR - Abnormal; Notable for the following components:      Result Value   Prothrombin Time 23.8 (*)    All other components within normal limits    EKG None  Radiology Ct Head Wo Contrast  Result Date: 12/22/2017 CLINICAL DATA:  Headache x5 days. EXAM: CT HEAD WITHOUT CONTRAST TECHNIQUE: Contiguous axial images were obtained from the base of the skull through the vertex without intravenous contrast. COMPARISON:  09/21/2017 FINDINGS: Brain: Chronic involutional changes of  the brain with moderate to marked chronic small vessel ischemic disease of periventricular white matter, centra semiovale, basal ganglia and cerebellum. No hydrocephalus, intra-axial mass nor extra-axial fluid collections. No hemorrhage. Vascular: Moderate-to-marked atherosclerotic calcifications involving the carotid siphons and distal vertebral arteries. Skull: No acute nor suspicious osseous abnormality. Sinuses/Orbits: Stigmata of chronic right maxillary sinusitis with thickened right maxillary sinus walls and mucosal opacification noted. The ethmoid and sphenoid as well as frontal sinuses are clear. Unremarkable orbits and globes. Other: Clear mastoids. IMPRESSION: 1. Atrophy with chronic small vessel ischemia. No acute intracranial abnormality. 2. Stigmata of chronic right maxillary sinusitis. Electronically Signed   By: Ashley Royalty M.D.   On: 12/22/2017 20:19    Procedures Procedures (including critical care time)  Medications Ordered in ED Medications  acetaminophen (TYLENOL) tablet 650 mg (has no administration in time range)     Initial Impression / Assessment and Plan / ED Course  I have reviewed the triage vital signs and the nursing notes.  Pertinent labs & imaging results that were available during my care of the patient were reviewed by me and considered in my medical decision making (see chart for details).     Patient with headache.  Has had since Sunday but also has had on and off for longer time.  Has seen PCP for.  CT scan done due to anticoagulation with headache.  Reassuring.  Nonfocal exam.  Discharged home to follow-up with PCP.  Final Clinical Impressions(s) / ED Diagnoses   Final diagnoses:  Nonintractable headache, unspecified chronicity pattern, unspecified headache type  Neck pain    ED Discharge Orders    None       Davonna Belling, MD 12/22/17 2142

## 2017-12-22 NOTE — ED Triage Notes (Signed)
Headache on and off since Sunday

## 2017-12-23 ENCOUNTER — Ambulatory Visit (INDEPENDENT_AMBULATORY_CARE_PROVIDER_SITE_OTHER): Payer: Medicare HMO | Admitting: Family Medicine

## 2017-12-23 VITALS — BP 152/64 | Ht 62.0 in | Wt 198.0 lb

## 2017-12-23 DIAGNOSIS — M5481 Occipital neuralgia: Secondary | ICD-10-CM | POA: Diagnosis not present

## 2017-12-23 DIAGNOSIS — I10 Essential (primary) hypertension: Secondary | ICD-10-CM

## 2017-12-23 MED ORDER — PREDNISONE 20 MG PO TABS: ORAL_TABLET | ORAL | 0 refills | Status: DC

## 2017-12-23 NOTE — Patient Instructions (Signed)
May use Tylenol as needed  Use prednisone 2 tablets once a day for 4 days

## 2017-12-23 NOTE — Progress Notes (Signed)
   Subjective:    Patient ID: [name], female    DOB: 08/03/1926, 84 y.o.   MRN: [id number]  HPI  Patient arrives for an ER follow up for a recent visit for headache. Significant time spent with patient Talked about headache She denies dizziness passing out Denies any injury CT scan negative Blood pressure elevated in ER Blood pressure control follow-up overall fairly good Review of Systems  Constitutional: Negative for activity change, appetite change and fatigue.  HENT: Negative for congestion.   Respiratory: Negative for cough.   Cardiovascular: Negative for chest pain.  Gastrointestinal: Negative for abdominal pain.  Endocrine: Negative for polydipsia and polyphagia.  Skin: Negative for color change.  Neurological: Negative for weakness.  Psychiatric/Behavioral: Negative for confusion.       Objective:   Physical Exam  Constitutional: She appears well-nourished. No distress.  Cardiovascular: Normal rate, regular rhythm and normal heart sounds.  No murmur heard. Pulmonary/Chest: Effort normal and breath sounds normal. No respiratory distress.  Musculoskeletal: She exhibits no edema.  Lymphadenopathy:    She has no cervical adenopathy.  Neurological: She is alert. She exhibits normal muscle tone.  Psychiatric: Her behavior is normal.  Vitals reviewed.   15 minutes was spent with patient today discussing healthcare issues which they came.  More than 50% of this visit-total duration of visit-was spent in counseling and coordination of care.  Please see diagnosis regarding the focus of this coordination and care       Assessment & Plan:  Blood pressure was checked sitting and standing Frequent headaches posterior right side Denies radiation down the arm More than likely occipital neuralgia Prednisone taper Follow-up if problems

## 2018-01-02 ENCOUNTER — Ambulatory Visit (INDEPENDENT_AMBULATORY_CARE_PROVIDER_SITE_OTHER): Payer: Medicare HMO | Admitting: *Deleted

## 2018-01-02 DIAGNOSIS — I639 Cerebral infarction, unspecified: Secondary | ICD-10-CM | POA: Diagnosis not present

## 2018-01-02 DIAGNOSIS — I495 Sick sinus syndrome: Secondary | ICD-10-CM | POA: Diagnosis not present

## 2018-01-02 DIAGNOSIS — I4891 Unspecified atrial fibrillation: Secondary | ICD-10-CM | POA: Diagnosis not present

## 2018-01-02 DIAGNOSIS — Z5181 Encounter for therapeutic drug level monitoring: Secondary | ICD-10-CM | POA: Diagnosis not present

## 2018-01-02 LAB — POCT INR: INR: 2.7 (ref 2.0–3.0)

## 2018-01-02 NOTE — Patient Instructions (Signed)
Continue coumadin 1/2 tablet daily Recheck in 5 weeks

## 2018-01-09 ENCOUNTER — Ambulatory Visit (INDEPENDENT_AMBULATORY_CARE_PROVIDER_SITE_OTHER): Payer: Medicare HMO | Admitting: *Deleted

## 2018-01-09 DIAGNOSIS — I639 Cerebral infarction, unspecified: Secondary | ICD-10-CM

## 2018-01-09 DIAGNOSIS — I495 Sick sinus syndrome: Secondary | ICD-10-CM

## 2018-01-09 NOTE — Progress Notes (Signed)
Remote pacemaker transmission.   

## 2018-01-11 ENCOUNTER — Ambulatory Visit: Payer: Medicare HMO | Admitting: Family Medicine

## 2018-01-13 ENCOUNTER — Other Ambulatory Visit: Payer: Self-pay

## 2018-01-13 MED ORDER — DILTIAZEM HCL ER BEADS 360 MG PO CP24: 360.0000 mg | ORAL_CAPSULE | Freq: Every day | ORAL | 0 refills | Status: DC

## 2018-01-13 NOTE — Telephone Encounter (Signed)
Pt was due for f/u in May, 30 day supply given

## 2018-01-16 DIAGNOSIS — L602 Onychogryphosis: Secondary | ICD-10-CM | POA: Diagnosis not present

## 2018-01-16 DIAGNOSIS — I70293 Other atherosclerosis of native arteries of extremities, bilateral legs: Secondary | ICD-10-CM | POA: Diagnosis not present

## 2018-01-16 DIAGNOSIS — M79671 Pain in right foot: Secondary | ICD-10-CM | POA: Diagnosis not present

## 2018-01-16 DIAGNOSIS — M79672 Pain in left foot: Secondary | ICD-10-CM | POA: Diagnosis not present

## 2018-01-16 DIAGNOSIS — B351 Tinea unguium: Secondary | ICD-10-CM | POA: Diagnosis not present

## 2018-01-16 DIAGNOSIS — I739 Peripheral vascular disease, unspecified: Secondary | ICD-10-CM | POA: Diagnosis not present

## 2018-01-16 DIAGNOSIS — L84 Corns and callosities: Secondary | ICD-10-CM | POA: Diagnosis not present

## 2018-02-02 ENCOUNTER — Other Ambulatory Visit: Payer: Self-pay | Admitting: Internal Medicine

## 2018-02-02 LAB — CUP PACEART REMOTE DEVICE CHECK
Brady Statistic RV Percent Paced: 64 %
Date Time Interrogation Session: 20190805060759
Implantable Lead Implant Date: 20100602
Implantable Lead Location: 753860
Lead Channel Impedance Value: 430 Ohm
Lead Channel Pacing Threshold Amplitude: 0.5 V
Lead Channel Sensing Intrinsic Amplitude: 12 mV
Lead Channel Setting Pacing Amplitude: 2.5 V
MDC IDC LEAD IMPLANT DT: 20100602
MDC IDC LEAD LOCATION: 753859
MDC IDC MSMT BATTERY REMAINING LONGEVITY: 37 mo
MDC IDC MSMT BATTERY REMAINING PERCENTAGE: 29 %
MDC IDC MSMT BATTERY VOLTAGE: 2.81 V
MDC IDC MSMT LEADCHNL RV PACING THRESHOLD PULSEWIDTH: 0.4 ms
MDC IDC PG IMPLANT DT: 20100602
MDC IDC SET LEADCHNL RV PACING PULSEWIDTH: 0.4 ms
MDC IDC SET LEADCHNL RV SENSING SENSITIVITY: 2 mV
Pulse Gen Model: 2210
Pulse Gen Serial Number: 2323973

## 2018-02-08 ENCOUNTER — Ambulatory Visit (INDEPENDENT_AMBULATORY_CARE_PROVIDER_SITE_OTHER): Payer: Medicare HMO | Admitting: *Deleted

## 2018-02-08 ENCOUNTER — Other Ambulatory Visit: Payer: Self-pay | Admitting: Family Medicine

## 2018-02-08 DIAGNOSIS — I495 Sick sinus syndrome: Secondary | ICD-10-CM

## 2018-02-08 DIAGNOSIS — I639 Cerebral infarction, unspecified: Secondary | ICD-10-CM

## 2018-02-08 DIAGNOSIS — Z5181 Encounter for therapeutic drug level monitoring: Secondary | ICD-10-CM | POA: Diagnosis not present

## 2018-02-08 DIAGNOSIS — I4891 Unspecified atrial fibrillation: Secondary | ICD-10-CM

## 2018-02-08 LAB — POCT INR: INR: 2.8 (ref 2.0–3.0)

## 2018-02-08 NOTE — Patient Instructions (Signed)
Continue coumadin 1/2 tablet daily Recheck in 6 weeks

## 2018-03-08 ENCOUNTER — Other Ambulatory Visit: Payer: Self-pay | Admitting: Internal Medicine

## 2018-03-17 DIAGNOSIS — E785 Hyperlipidemia, unspecified: Secondary | ICD-10-CM | POA: Diagnosis not present

## 2018-03-17 DIAGNOSIS — I4891 Unspecified atrial fibrillation: Secondary | ICD-10-CM | POA: Diagnosis not present

## 2018-03-17 DIAGNOSIS — E669 Obesity, unspecified: Secondary | ICD-10-CM | POA: Diagnosis not present

## 2018-03-17 DIAGNOSIS — I4892 Unspecified atrial flutter: Secondary | ICD-10-CM | POA: Diagnosis not present

## 2018-03-17 DIAGNOSIS — I2510 Atherosclerotic heart disease of native coronary artery without angina pectoris: Secondary | ICD-10-CM | POA: Diagnosis not present

## 2018-03-17 DIAGNOSIS — G3184 Mild cognitive impairment, so stated: Secondary | ICD-10-CM | POA: Diagnosis not present

## 2018-03-17 DIAGNOSIS — H5462 Unqualified visual loss, left eye, normal vision right eye: Secondary | ICD-10-CM | POA: Diagnosis not present

## 2018-03-17 DIAGNOSIS — I10 Essential (primary) hypertension: Secondary | ICD-10-CM | POA: Diagnosis not present

## 2018-03-17 DIAGNOSIS — J42 Unspecified chronic bronchitis: Secondary | ICD-10-CM | POA: Diagnosis not present

## 2018-03-17 DIAGNOSIS — I69344 Monoplegia of lower limb following cerebral infarction affecting left non-dominant side: Secondary | ICD-10-CM | POA: Diagnosis not present

## 2018-03-21 ENCOUNTER — Other Ambulatory Visit: Payer: Self-pay | Admitting: Family Medicine

## 2018-04-03 ENCOUNTER — Ambulatory Visit (INDEPENDENT_AMBULATORY_CARE_PROVIDER_SITE_OTHER): Payer: Medicare HMO | Admitting: *Deleted

## 2018-04-03 DIAGNOSIS — I495 Sick sinus syndrome: Secondary | ICD-10-CM | POA: Diagnosis not present

## 2018-04-03 DIAGNOSIS — Z5181 Encounter for therapeutic drug level monitoring: Secondary | ICD-10-CM | POA: Diagnosis not present

## 2018-04-03 DIAGNOSIS — I4891 Unspecified atrial fibrillation: Secondary | ICD-10-CM

## 2018-04-03 DIAGNOSIS — I639 Cerebral infarction, unspecified: Secondary | ICD-10-CM

## 2018-04-03 LAB — POCT INR: INR: 2.4 (ref 2.0–3.0)

## 2018-04-03 NOTE — Patient Instructions (Signed)
Continue coumadin 1/2 tablet daily Recheck in 6 weeks

## 2018-04-10 ENCOUNTER — Ambulatory Visit (INDEPENDENT_AMBULATORY_CARE_PROVIDER_SITE_OTHER): Payer: Medicare HMO | Admitting: *Deleted

## 2018-04-10 DIAGNOSIS — I495 Sick sinus syndrome: Secondary | ICD-10-CM | POA: Diagnosis not present

## 2018-04-10 NOTE — Progress Notes (Signed)
Remote pacemaker transmission.   

## 2018-04-11 ENCOUNTER — Encounter: Payer: Self-pay | Admitting: Family Medicine

## 2018-04-11 ENCOUNTER — Ambulatory Visit (HOSPITAL_COMMUNITY)
Admission: RE | Admit: 2018-04-11 | Discharge: 2018-04-11 | Disposition: A | Discharge status: Home / Self Care | Payer: Medicare HMO | Source: Ambulatory Visit | Attending: Family Medicine | Admitting: Family Medicine

## 2018-04-11 ENCOUNTER — Ambulatory Visit (INDEPENDENT_AMBULATORY_CARE_PROVIDER_SITE_OTHER): Payer: Medicare HMO | Admitting: Family Medicine

## 2018-04-11 VITALS — BP 136/88 | Ht 62.0 in

## 2018-04-11 DIAGNOSIS — M5136 Other intervertebral disc degeneration, lumbar region: Secondary | ICD-10-CM | POA: Diagnosis not present

## 2018-04-11 DIAGNOSIS — M8938 Hypertrophy of bone, other site: Secondary | ICD-10-CM | POA: Insufficient documentation

## 2018-04-11 DIAGNOSIS — M5432 Sciatica, left side: Secondary | ICD-10-CM

## 2018-04-11 DIAGNOSIS — Z23 Encounter for immunization: Secondary | ICD-10-CM

## 2018-04-11 DIAGNOSIS — M549 Dorsalgia, unspecified: Secondary | ICD-10-CM | POA: Diagnosis present

## 2018-04-11 MED ORDER — PREDNISONE 20 MG PO TABS: ORAL_TABLET | ORAL | 0 refills | Status: DC

## 2018-04-11 MED ORDER — FAMOTIDINE 20 MG PO TABS: 20.0000 mg | ORAL_TABLET | Freq: Two times a day (BID) | ORAL | 5 refills | Status: DC

## 2018-04-11 NOTE — Progress Notes (Addendum)
  Subjective:     Patient ID: [name], female   DOB: 01-09-1927, 84 y.o.   MRN: [id number]  Headache   This is a chronic problem. Pertinent negatives include no abdominal pain or coughing.  pt states her headaches have gotten better.  She states that headaches are doing much much better not having any severe headaches no vomiting no double vision  Pt is having left leg pain and it is radiating up to thigh.  Patient with sciatica symptoms This is been going on for a while the past several weeks Have tried various measures without much success   Pt concerned about Ranitidine due to recall.  Patient wants to try something different Review of Systems  Constitutional: Negative for activity change, appetite change and fatigue.  HENT: Negative for congestion.   Respiratory: Negative for cough.   Cardiovascular: Negative for chest pain.  Gastrointestinal: Negative for abdominal pain.  Skin: Negative for color change.  Neurological: Positive for headaches.  Psychiatric/Behavioral: Negative for behavioral problems.       Objective:   Physical Exam  Constitutional: She appears well-nourished. No distress.  HENT:  Head: Normocephalic and atraumatic.  Eyes: Right eye exhibits no discharge. Left eye exhibits no discharge.  Neck: No tracheal deviation present.  Cardiovascular: Normal rate, regular rhythm and normal heart sounds.  No murmur heard. Pulmonary/Chest: Effort normal and breath sounds normal. No respiratory distress.  Musculoskeletal: She exhibits no edema.  Lymphadenopathy:    She has no cervical adenopathy.  Neurological: She is alert. Coordination normal.  Skin: Skin is warm and dry.  Psychiatric: She has a normal mood and affect. Her behavior is normal.  Vitals reviewed.      Assessment:     Headaches have resolved  Switch from ranitidine to Pepcid rationale discussed  Left leg sciatica x-rays use a cane or walker to get around short course of prednisone if  persisting may need injections Lumbar spine x-ray ordered    Plan:     See above     Elevated calcium recheck metabolic 7 in December

## 2018-04-13 ENCOUNTER — Encounter: Payer: Self-pay | Admitting: Cardiology

## 2018-04-13 ENCOUNTER — Ambulatory Visit: Payer: Medicare HMO

## 2018-04-17 ENCOUNTER — Other Ambulatory Visit: Payer: Self-pay | Admitting: Internal Medicine

## 2018-04-18 DIAGNOSIS — I739 Peripheral vascular disease, unspecified: Secondary | ICD-10-CM | POA: Diagnosis not present

## 2018-04-18 DIAGNOSIS — B351 Tinea unguium: Secondary | ICD-10-CM | POA: Diagnosis not present

## 2018-04-18 DIAGNOSIS — L84 Corns and callosities: Secondary | ICD-10-CM | POA: Diagnosis not present

## 2018-05-08 ENCOUNTER — Other Ambulatory Visit: Payer: Self-pay | Admitting: Family Medicine

## 2018-05-08 ENCOUNTER — Other Ambulatory Visit: Payer: Self-pay | Admitting: Internal Medicine

## 2018-05-12 ENCOUNTER — Other Ambulatory Visit: Payer: Self-pay

## 2018-05-12 DIAGNOSIS — E213 Hyperparathyroidism, unspecified: Secondary | ICD-10-CM

## 2018-05-12 LAB — BASIC METABOLIC PANEL
BUN/Creatinine Ratio: 11 — ABNORMAL LOW (ref 12–28)
BUN: 13 mg/dL (ref 8–27)
CALCIUM: 10.8 mg/dL — AB (ref 8.7–10.3)
CO2: 24 mmol/L (ref 20–29)
CREATININE: 1.2 mg/dL — AB (ref 0.57–1.00)
Chloride: 105 mmol/L (ref 96–106)
GFR calc Af Amer: 46 mL/min/{1.73_m2} — ABNORMAL LOW (ref >59)
GFR calc non Af Amer: 40 mL/min/{1.73_m2} — ABNORMAL LOW (ref >59)
GLUCOSE: 101 mg/dL — AB (ref 65–99)
POTASSIUM: 4.5 mmol/L (ref 3.5–5.2)
SODIUM: 144 mmol/L (ref 134–144)

## 2018-05-12 NOTE — Addendum Note (Signed)
Addended by: Karle Barr on: 05/12/2018 04:26 PM   Modules accepted: Orders

## 2018-05-15 ENCOUNTER — Ambulatory Visit (INDEPENDENT_AMBULATORY_CARE_PROVIDER_SITE_OTHER): Payer: Medicare HMO | Admitting: *Deleted

## 2018-05-15 ENCOUNTER — Encounter: Payer: Self-pay | Admitting: Family Medicine

## 2018-05-15 DIAGNOSIS — I639 Cerebral infarction, unspecified: Secondary | ICD-10-CM | POA: Diagnosis not present

## 2018-05-15 DIAGNOSIS — Z5181 Encounter for therapeutic drug level monitoring: Secondary | ICD-10-CM

## 2018-05-15 DIAGNOSIS — I4891 Unspecified atrial fibrillation: Secondary | ICD-10-CM | POA: Diagnosis not present

## 2018-05-15 DIAGNOSIS — I495 Sick sinus syndrome: Secondary | ICD-10-CM | POA: Diagnosis not present

## 2018-05-15 LAB — POCT INR: INR: 2.6 (ref 2.0–3.0)

## 2018-05-15 NOTE — Patient Instructions (Signed)
Continue coumadin 1/2 tablet daily Recheck in 6 weeks

## 2018-05-16 LAB — PTH, INTACT AND CALCIUM
Calcium: 10.8 mg/dL — ABNORMAL HIGH (ref 8.7–10.3)
PTH: 65 pg/mL (ref 15–65)

## 2018-05-18 ENCOUNTER — Other Ambulatory Visit: Payer: Self-pay

## 2018-05-18 ENCOUNTER — Emergency Department (HOSPITAL_COMMUNITY)
Admission: EM | Admit: 2018-05-18 | Discharge: 2018-05-19 | Disposition: A | Discharge status: Home / Self Care | Payer: Medicare HMO | Attending: Emergency Medicine | Admitting: Emergency Medicine

## 2018-05-18 ENCOUNTER — Encounter (HOSPITAL_COMMUNITY): Payer: Self-pay

## 2018-05-18 DIAGNOSIS — Z7901 Long term (current) use of anticoagulants: Secondary | ICD-10-CM | POA: Insufficient documentation

## 2018-05-18 DIAGNOSIS — I10 Essential (primary) hypertension: Secondary | ICD-10-CM | POA: Insufficient documentation

## 2018-05-18 DIAGNOSIS — R0902 Hypoxemia: Secondary | ICD-10-CM | POA: Diagnosis not present

## 2018-05-18 DIAGNOSIS — S161XXA Strain of muscle, fascia and tendon at neck level, initial encounter: Secondary | ICD-10-CM | POA: Insufficient documentation

## 2018-05-18 DIAGNOSIS — Y939 Activity, unspecified: Secondary | ICD-10-CM | POA: Insufficient documentation

## 2018-05-18 DIAGNOSIS — Y929 Unspecified place or not applicable: Secondary | ICD-10-CM | POA: Diagnosis not present

## 2018-05-18 DIAGNOSIS — Z79899 Other long term (current) drug therapy: Secondary | ICD-10-CM | POA: Diagnosis not present

## 2018-05-18 DIAGNOSIS — X58XXXA Exposure to other specified factors, initial encounter: Secondary | ICD-10-CM | POA: Diagnosis not present

## 2018-05-18 DIAGNOSIS — R51 Headache: Secondary | ICD-10-CM | POA: Diagnosis present

## 2018-05-18 DIAGNOSIS — I959 Hypotension, unspecified: Secondary | ICD-10-CM | POA: Diagnosis not present

## 2018-05-18 DIAGNOSIS — Y999 Unspecified external cause status: Secondary | ICD-10-CM | POA: Insufficient documentation

## 2018-05-18 DIAGNOSIS — I4891 Unspecified atrial fibrillation: Secondary | ICD-10-CM | POA: Diagnosis not present

## 2018-05-18 DIAGNOSIS — G4489 Other headache syndrome: Secondary | ICD-10-CM | POA: Diagnosis not present

## 2018-05-18 DIAGNOSIS — E119 Type 2 diabetes mellitus without complications: Secondary | ICD-10-CM | POA: Diagnosis not present

## 2018-05-18 MED ORDER — DIAZEPAM 2 MG PO TABS
2.0000 mg | ORAL_TABLET | Freq: Once | ORAL | Status: AC
  Administered 2018-05-18: 2 mg via ORAL
  Filled 2018-05-18: qty 1

## 2018-05-18 MED ORDER — KETOROLAC TROMETHAMINE 60 MG/2ML IM SOLN
30.0000 mg | Freq: Once | INTRAMUSCULAR | Status: AC
  Administered 2018-05-18: 30 mg via INTRAMUSCULAR
  Filled 2018-05-18: qty 2

## 2018-05-18 NOTE — ED Triage Notes (Signed)
Pt from home, in via [facility] for headache worsening since yesterday.

## 2018-05-19 MED ORDER — IBUPROFEN 400 MG PO TABS: 400.0000 mg | ORAL_TABLET | Freq: Four times a day (QID) | ORAL | 0 refills | Status: DC | PRN

## 2018-05-19 MED ORDER — DIAZEPAM 5 MG PO TABS: 2.5000 mg | ORAL_TABLET | Freq: Three times a day (TID) | ORAL | 0 refills | Status: DC | PRN

## 2018-05-19 NOTE — ED Provider Notes (Signed)
[facility] EMERGENCY DEPARTMENT Provider Note   CSN: [id number] Arrival date & time: 05/18/18  2210     History   Chief Complaint Chief Complaint  Patient presents with  . Headache    HPI [name] is a 84 y.o. female.   Headache   This is a new problem. The current episode started yesterday. The problem occurs constantly. The problem has not changed since onset.The pain is located in the temporal region. The quality of the pain is described as dull. The pain is at a severity of 5/10. The pain is moderate. The pain radiates to the right shoulder and right arm. She has tried nothing for the symptoms.    Past Medical History:  Diagnosis Date  . Anemia    H/H of 11.3/36.7 in 12/2008 ;normal MCV; normal CBC in 2011  . Angioedema 08/2006  . Atrial fibrillation (HCC)    persistant  . Blood transfusion   . 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 Chronic bronchitis   . Chronic renal insufficiency    baseline creatine 1.4; 1.04 in 03/2010  . DJD (degenerative joint disease)    hands, knees  . Gout   . Hyperlipidemia   . Hypertension    heart disease diastolic dysfunction ; OX-73%; mild pulmonary edema in 2006;responded to diurectics ; LVH  . Kidney stones   . Mitral valve disease    Severe mitral annular calcification; mild to moderate stenosis-not clearly rheumatic  . Nephrolithiasis   . Pacemaker   . Retinal hemorrhage   . Seizure disorder ([place])   . Shortness of breath    only with exertion  . Stroke ([place])   . Tachycardia-bradycardia syndrome ([place])    with pauses and syncope;PPM implantation 10/2008,negative stress nuclear study 03/2005  . Vertigo     Patient Active Problem List   Diagnosis Date Noted  . Thyroid nodule 10/04/2017  . Right sided  weakness   . Acute ischemic stroke ([place]) 09/20/2017  . Controlled type 2 diabetes mellitus with complication, without long-term current use of insulin ([place]) 07/08/2016  . Prediabetes 12/25/2015  . 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 Vitreous hemorrhage ([place]) 02/12/2014  . TIA (transient ischemic attack) 08/20/2013  . Encounter for therapeutic drug monitoring 06/28/2013  . Gout 11/14/2012  . Fasting hyperglycemia 04/06/2012  . Atrial fibrillation ([place]) 08/31/2011  . Chronic anticoagulation 12/16/2010  . 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 Cerebrovascular accident ([place])   . DJD (degenerative joint disease)   . Hypertension 02/21/2009  . Sick sinus syndrome ([place]) 02/21/2009  . Hyperlipidemia 11/26/2008  . SEIZURE DISORDER 10/31/2008    Past Surgical History:  Procedure Laterality Date  . CATARACT EXTRACTION W/PHACO  03/29/2011   Procedure: CATARACT EXTRACTION PHACO AND INTRAOCULAR LENS PLACEMENT (IOC);  Surgeon: Tonny Branch;  Location: [facility];  Service: Ophthalmology;  Laterality: Right;  CDE 12.62  . INSERT / REPLACE / REMOVE PACEMAKER  11/2008   St.Jude DUAL chamber pacemaker 11/06/2008  . MEMBRANE PEEL Right 02/12/2014   Procedure: MEMBRANE PEEL;  Surgeon: [name], MD;  Location: [place];  Service: Ophthalmology;  Laterality: Right;  . PARS PLANA VITRECTOMY Right 02/12/2014   Procedure: PARS PLANA VITRECTOMY WITH 25 GAUGE;  Surgeon: [name], MD;  Location: [place];  Service: Ophthalmology;  Laterality: Right;  .  TOTAL KNEE ARTHROPLASTY  04/2009   Right     OB History    Gravida  5   Para  5   Term  5   Preterm      AB      Living  5     SAB      TAB      Ectopic      Multiple      Live Births               Home Medications    Prior to Admission medications   Medication Sig Start Date End Date Taking? Authorizing Provider  albuterol (PROVENTIL HFA;VENTOLIN HFA) 108 (90 Base) MCG/ACT inhaler TAKE 2 PUFFS EVERY 6 HOURS AS NEEDED 03/21/18    Kathyrn Drown, MD  allopurinol (ZYLOPRIM) 100 MG tablet TAKE 1 TABLET BY MOUTH EVERY MONDAY, WEDNESDAY, AND FRIDAY. 11/16/17   Kathyrn Drown, MD  diazepam (VALIUM) 5 MG tablet Take 0.5 tablets (2.5 mg total) by mouth every 8 (eight) hours as needed (neck pain). 05/19/18   Zerenity Bowron, Corene Cornea, MD  diltiazem (TIAZAC) 360 MG 24 hr capsule Take 1 capsule (360 mg total) by mouth daily. 01/13/18   Evans Lance, MD  diltiazem (TIAZAC) 360 MG 24 hr capsule TAKE ONE CAPSULE BY MOUTH ONCE DAILY. 02/02/18   Evans Lance, MD  diltiazem (TIAZAC) 360 MG 24 hr capsule TAKE ONE CAPSULE BY MOUTH ONCE DAILY. 03/08/18   Evans Lance, MD  diltiazem (TIAZAC) 360 MG 24 hr capsule TAKE ONE CAPSULE BY MOUTH ONCE DAILY. 04/17/18   Evans Lance, MD  diltiazem (TIAZAC) 360 MG 24 hr capsule TAKE ONE CAPSULE BY MOUTH ONCE DAILY. 05/08/18   Evans Lance, MD  dorzolamide-timolol (COSOPT) 22.3-6.8 MG/ML ophthalmic solution Place 1 drop into both eyes at bedtime.     [provider]  famotidine (PEPCID) 20 MG tablet Take 1 tablet (20 mg total) by mouth 2 (two) times daily. 04/11/18   Kathyrn Drown, MD  furosemide (LASIX) 20 MG tablet TAKE (1) TABLET BY MOUTH ONCE DAILY. 09/14/17   Kathyrn Drown, MD  ibuprofen (ADVIL,MOTRIN) 400 MG tablet Take 1 tablet (400 mg total) by mouth every 6 (six) hours as needed. 05/19/18   Raelie Lohr, Corene Cornea, MD  lisinopril (PRINIVIL,ZESTRIL) 20 MG tablet TAKE (1) TABLET BY MOUTH ONCE DAILY. 07/06/17   Evans Lance, MD  meclizine (ANTIVERT) 25 MG tablet Take 1 tablet (25 mg total) by mouth 3 (three) times daily as needed. Dizziness 09/29/17   Kathyrn Drown, MD  pravastatin (PRAVACHOL) 80 MG tablet TAKE (1) TABLET BY MOUTH AT BEDTIME. 05/08/18   Kathyrn Drown, MD  predniSONE (DELTASONE) 20 MG tablet 2 qd for 5 days 04/11/18   Kathyrn Drown, MD  warfarin (COUMADIN) 5 MG tablet TAKE 1/2 TO 1 TABLET BY MOUTH DAILY OR AS DIRECTED. 03/08/18   Evans Lance, MD  colchicine 0.6 MG tablet Take  0.6 mg by mouth daily as needed. Gout Flare Ups  08/31/11  [provider]  diltiazem (CARDIZEM CD) 360 MG 24 hr capsule TAKE (1) CAPSULE BY MOUTH ONCE DAILY. 08/12/11 08/31/11  Yehuda Savannah, MD  simvastatin (ZOCOR) 40 MG tablet Take 40 mg by mouth at bedtime.    08/31/11  [provider]    Family History Family History  Problem Relation Age of Onset  . Cancer Other        unknown cancer  . Hypotension Neg  Hx   . Anesthesia problems Neg Hx   . Pseudochol deficiency Neg Hx   . Malignant hyperthermia Neg Hx     Social History Social History   Tobacco Use  . Smoking status: Never Smoker  . Smokeless tobacco: Never Used  Substance Use Topics  . Alcohol use: No    Alcohol/week: 0.0 standard drinks  . Drug use: No     Allergies   Tramadol   Review of Systems Review of Systems  Neurological: Positive for headaches.  All other systems reviewed and are negative.    Physical Exam Updated Vital Signs BP (!) 158/64 (BP Location: Left Arm)   Pulse 75   Temp 98.1 F (36.7 C) (Oral)   Resp 16   Ht 5\' 2"  (1.575 m)   Wt 81.6 kg   SpO2 97%   BMI 32.92 kg/m   Physical Exam Vitals signs and nursing note reviewed.  Constitutional:      Appearance: She is well-developed.  HENT:     Head: Normocephalic and atraumatic.  Eyes:     Extraocular Movements: Extraocular movements intact.  Neck:     Musculoskeletal: Normal range of motion.  Cardiovascular:     Rate and Rhythm: Normal rate and regular rhythm.  Pulmonary:     Effort: Pulmonary effort is normal. No respiratory distress.     Breath sounds: No stridor.  Abdominal:     General: There is no distension.     Palpations: Abdomen is soft.  Musculoskeletal:        General: Tenderness (right trapezius, neck, shoulder) present.  Skin:    General: Skin is warm and dry.  Neurological:     Mental Status: She is alert.  Psychiatric:        Mood and Affect: Mood normal.        Speech: Speech normal.         Behavior: Behavior normal.      ED Treatments / Results  Labs (all labs ordered are listed, but only abnormal results are displayed) Labs Reviewed - No data to display  EKG None  Radiology No results found.  Procedures Procedures (including critical care time)  Medications Ordered in ED Medications  diazepam (VALIUM) tablet 2 mg (2 mg Oral Given 05/18/18 2314)  ketorolac (TORADOL) injection 30 mg (30 mg Intramuscular Given 05/18/18 2314)     Initial Impression / Assessment and Plan / ED Course  I have reviewed the triage vital signs and the nursing notes.  Pertinent labs & imaging results that were available during my care of the patient were reviewed by me and considered in my medical decision making (see chart for details).     Think her pain is more related to a muscular strain rather than a primary headache.  Provement with muscle relaxers and anti-inflammatories here.  Low suspicion for intracranial causes.  Not radiculopathic to suggest possible neurocompression.  Stable for discharge. Discussed serious side effects of valium with her, her husband and son and she will use caution.  Final Clinical Impressions(s) / ED Diagnoses   Final diagnoses:  Strain of neck muscle, initial encounter    ED Discharge Orders         Ordered    ibuprofen (ADVIL,MOTRIN) 400 MG tablet  Every 6 hours PRN     05/19/18 0021    diazepam (VALIUM) 5 MG tablet  Every 8 hours PRN     05/19/18 0021  Markeeta Scalf, Corene Cornea, MD 05/19/18 [phone]

## 2018-05-19 NOTE — Discharge Instructions (Addendum)
Medicine I am giving you for your neck sprain can make you sleepy and decrease your balance to be careful if you are taking it.  I would suggest only taking it when your pain is very severe and you know you will not be needing to go anywhere.

## 2018-05-22 ENCOUNTER — Other Ambulatory Visit: Payer: Self-pay | Admitting: Internal Medicine

## 2018-05-24 ENCOUNTER — Encounter: Payer: Self-pay | Admitting: Family Medicine

## 2018-05-24 ENCOUNTER — Ambulatory Visit (INDEPENDENT_AMBULATORY_CARE_PROVIDER_SITE_OTHER): Payer: Medicare HMO | Admitting: Family Medicine

## 2018-05-24 VITALS — BP 122/78 | Ht 62.0 in | Wt 198.0 lb

## 2018-05-24 DIAGNOSIS — I10 Essential (primary) hypertension: Secondary | ICD-10-CM | POA: Diagnosis not present

## 2018-05-24 DIAGNOSIS — G542 Cervical root disorders, not elsewhere classified: Secondary | ICD-10-CM

## 2018-05-24 DIAGNOSIS — I4891 Unspecified atrial fibrillation: Secondary | ICD-10-CM | POA: Diagnosis not present

## 2018-05-24 DIAGNOSIS — E785 Hyperlipidemia, unspecified: Secondary | ICD-10-CM | POA: Diagnosis not present

## 2018-05-24 DIAGNOSIS — M542 Cervicalgia: Secondary | ICD-10-CM | POA: Diagnosis not present

## 2018-05-24 DIAGNOSIS — E118 Type 2 diabetes mellitus with unspecified complications: Secondary | ICD-10-CM

## 2018-05-24 MED ORDER — DILTIAZEM HCL ER BEADS 360 MG PO CP24: 360.0000 mg | ORAL_CAPSULE | Freq: Every day | ORAL | 1 refills | Status: DC

## 2018-05-24 NOTE — Progress Notes (Signed)
Subjective:    Patient ID: [name], female    DOB: [birth year], 84 y.o.   MRN: [id number]  HPI Patient arrives for a follow up on a recent ER visit for neck pain and headache. She relates that she has a fair amount of pain that is on the right side of her neck radiates up the side of her head to the top of her head no nausea or vomiting with it the pain comes and goes does not wake her up at night she has tried ibuprofen for it seemed to help  Also has diabetes controlled by dietary measures she watches diet closely.  She is unable to do much exercise because of her age the goal has been to keep her A1c within a reasonable range  Patient for blood pressure check up.  The patient does have hypertension.  The patient is on medication.  Patient relates compliance with meds. Todays BP reviewed with the patient. Patient denies issues with medication. Patient relates reasonable diet. Patient tries to minimize salt. Patient aware of BP goals.  Patient has atrial fibrillation under good control she does need to get get back in with cardiology  Hyperlipidemia she does try to watch her diet we try to keep this under reasonable control but at the same time she does take medication on a regular basis and states that it does well for her  Review of Systems  Constitutional: Negative for activity change, appetite change and fatigue.  HENT: Negative for congestion.   Respiratory: Negative for cough.   Cardiovascular: Negative for chest pain.  Gastrointestinal: Negative for abdominal pain.  Skin: Negative for color change.  Neurological: Negative for headaches.  Psychiatric/Behavioral: Negative for behavioral problems.       Objective:   Physical Exam Vitals signs reviewed.  Constitutional:      General: She is not in acute distress. HENT:     Head: Normocephalic and atraumatic.  Eyes:     General:        Right eye: No discharge.        Left eye: No discharge.  Neck:     Trachea: No  tracheal deviation.  Cardiovascular:     Rate and Rhythm: Normal rate.     Heart sounds: Normal heart sounds. No murmur.  Pulmonary:     Effort: Pulmonary effort is normal. No respiratory distress.     Breath sounds: Normal breath sounds.  Lymphadenopathy:     Cervical: No cervical adenopathy.  Skin:    General: Skin is warm and dry.  Neurological:     Mental Status: She is alert.     Coordination: Coordination normal.  Psychiatric:        Behavior: Behavior normal.    Subjective discomfort on the right side of her neck to palpation no mass felt       Assessment & Plan:  Diabetes watch diet stay active as best as possible check A1c await the results  Cervical pain x-rays indicated do not feel patient needs an MRI she may use ibuprofen as needed follow-up if progressive troubles or worse  Blood pressure good control continue current measures watch diet  Atrial fibrillation is under good control follow-up with cardiology  Hyperlipidemia taking medication continue this check lab work  Recheck here in 4 months  25 minutes was spent with the patient.  This statement verifies that 25 minutes was indeed spent with the patient.  More than 50% of this visit-total duration of the  visit-was spent in counseling and coordination of care. The issues that the patient came in for today as reflected in the diagnosis (s) please refer to documentation for further details.

## 2018-05-24 NOTE — Patient Instructions (Addendum)
Do not take Diazepam It can cause drowsiness and effect your alertness  May use Ibuprofen   We will help set up follow up with Heart Doc  Also get your eyes checked up in the first part of the year!!!

## 2018-05-25 ENCOUNTER — Encounter: Payer: Self-pay | Admitting: Family Medicine

## 2018-05-25 LAB — LIPID PANEL
CHOL/HDL RATIO: 2.1 ratio (ref 0.0–4.4)
Cholesterol, Total: 116 mg/dL (ref 100–199)
HDL: 54 mg/dL (ref >39)
LDL Calculated: 41 mg/dL (ref 0–99)
Triglycerides: 105 mg/dL (ref 0–149)
VLDL Cholesterol Cal: 21 mg/dL (ref 5–40)

## 2018-05-25 LAB — HEMOGLOBIN A1C
Est. average glucose Bld gHb Est-mCnc: 157 mg/dL
Hgb A1c MFr Bld: 7.1 % — ABNORMAL HIGH (ref 4.8–5.6)

## 2018-05-25 NOTE — Progress Notes (Signed)
Clarification please. 4 months or 4 weeks?

## 2018-05-27 NOTE — Progress Notes (Signed)
Follow up 4 months

## 2018-05-29 ENCOUNTER — Ambulatory Visit (HOSPITAL_COMMUNITY)
Admission: RE | Admit: 2018-05-29 | Discharge: 2018-05-29 | Disposition: A | Discharge status: Home / Self Care | Payer: Medicare HMO | Source: Ambulatory Visit | Attending: Family Medicine | Admitting: Family Medicine

## 2018-05-29 ENCOUNTER — Encounter: Payer: Self-pay | Admitting: Family Medicine

## 2018-05-29 DIAGNOSIS — M542 Cervicalgia: Secondary | ICD-10-CM | POA: Diagnosis not present

## 2018-05-29 NOTE — Progress Notes (Signed)
Referral put in.

## 2018-05-29 NOTE — Addendum Note (Signed)
Addended by: Carmelina Noun on: 05/29/2018 10:44 AM   Modules accepted: Orders

## 2018-06-09 LAB — CUP PACEART REMOTE DEVICE CHECK
Battery Remaining Longevity: 32 mo
Battery Remaining Percentage: 25 %
Brady Statistic RV Percent Paced: 65 %
Date Time Interrogation Session: 20191104063908
Implantable Lead Implant Date: 20100602
Implantable Lead Implant Date: 20100602
Implantable Lead Location: 753859
Implantable Pulse Generator Implant Date: 20100602
Lead Channel Impedance Value: 460 Ohm
Lead Channel Pacing Threshold Amplitude: 0.5 V
Lead Channel Pacing Threshold Pulse Width: 0.4 ms
Lead Channel Sensing Intrinsic Amplitude: 12 mV
Lead Channel Setting Pacing Amplitude: 2.5 V
Lead Channel Setting Pacing Pulse Width: 0.4 ms
Lead Channel Setting Sensing Sensitivity: 2 mV
MDC IDC LEAD LOCATION: 753860
MDC IDC MSMT BATTERY VOLTAGE: 2.8 V
Pulse Gen Model: 2210
Pulse Gen Serial Number: 2323973

## 2018-06-13 ENCOUNTER — Encounter (INDEPENDENT_AMBULATORY_CARE_PROVIDER_SITE_OTHER): Payer: Medicare HMO | Admitting: Ophthalmology

## 2018-06-13 DIAGNOSIS — H3509 Other intraretinal microvascular abnormalities: Secondary | ICD-10-CM

## 2018-06-13 DIAGNOSIS — H2512 Age-related nuclear cataract, left eye: Secondary | ICD-10-CM

## 2018-06-13 DIAGNOSIS — H35033 Hypertensive retinopathy, bilateral: Secondary | ICD-10-CM

## 2018-06-13 DIAGNOSIS — I10 Essential (primary) hypertension: Secondary | ICD-10-CM | POA: Diagnosis not present

## 2018-06-16 ENCOUNTER — Other Ambulatory Visit: Payer: Self-pay | Admitting: Family Medicine

## 2018-06-21 ENCOUNTER — Encounter: Payer: Self-pay | Admitting: "Endocrinology

## 2018-06-21 ENCOUNTER — Ambulatory Visit (INDEPENDENT_AMBULATORY_CARE_PROVIDER_SITE_OTHER): Payer: Medicare HMO | Admitting: "Endocrinology

## 2018-06-21 NOTE — Progress Notes (Signed)
Consult Note       06/21/2018, 1:14 PM  [name] is a 85 y.o.-year-old female, referred by her PCP, Dr. Wolfgang Phoenix for evaluation for hypercalcemia/hyperparathyroidism.   Past Medical History:  Diagnosis Date  . Anemia    H/H of 11.3/36.7 in 12/2008 ;normal MCV; normal CBC in 2011  . Angioedema 08/2006  . Atrial fibrillation (HCC)    persistant  . Blood transfusion   . 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 Chronic bronchitis   . Chronic renal insufficiency    baseline creatine 1.4; 1.04 in 03/2010  . DJD (degenerative joint disease)    hands, knees  . Gout   . Hyperlipidemia   . Hypertension    heart disease diastolic dysfunction ; QB-34%; mild pulmonary edema in 2006;responded to diurectics ; LVH  . Kidney stones   . Mitral valve disease    Severe mitral annular calcification; mild to moderate stenosis-not clearly rheumatic  . Nephrolithiasis   . Pacemaker   . Retinal hemorrhage   . Seizure disorder ([place])   . Shortness of breath    only with exertion  . Stroke ([place])   . Tachycardia-bradycardia syndrome ([place])    with pauses and syncope;PPM implantation 10/2008,negative stress nuclear study 03/2005  . Vertigo     Past Surgical History:  Procedure Laterality Date  . CATARACT EXTRACTION W/PHACO  03/29/2011   Procedure: CATARACT EXTRACTION PHACO AND INTRAOCULAR LENS PLACEMENT (IOC);  Surgeon: Tonny Branch;  Location: [facility];  Service: Ophthalmology;  Laterality: Right;  CDE 12.62  . INSERT / REPLACE / REMOVE PACEMAKER  11/2008   St.Jude DUAL chamber pacemaker 11/06/2008  . MEMBRANE PEEL Right 02/12/2014   Procedure: MEMBRANE PEEL;  Surgeon: [name], MD;  Location: [place];  Service: Ophthalmology;   Laterality: Right;  . PARS PLANA VITRECTOMY Right 02/12/2014   Procedure: PARS PLANA VITRECTOMY WITH 25 GAUGE;  Surgeon: [name], MD;  Location: [place];  Service: Ophthalmology;  Laterality: Right;  . TOTAL KNEE ARTHROPLASTY  04/2009   Right    Social History   Tobacco Use  . Smoking status: Never Smoker  . Smokeless tobacco: Never Used  Substance Use Topics  . Alcohol use: No    Alcohol/week: 0.0 standard drinks  . Drug use: No    Outpatient Encounter Medications as of 06/21/2018  Medication Sig  . albuterol (PROVENTIL HFA;VENTOLIN HFA) 108 (90 Base) MCG/ACT inhaler TAKE 2 PUFFS EVERY 6 HOURS AS NEEDED  . allopurinol (ZYLOPRIM) 100 MG tablet TAKE 1 TABLET BY MOUTH EVERY MONDAY, WEDNESDAY, AND FRIDAY.  . diazepam (VALIUM) 5 MG tablet Take 0.5 tablets (2.5 mg total) by mouth every 8 (eight) hours as needed (neck pain).  Marland Kitchen diltiazem (TIAZAC) 360 MG 24 hr capsule Take 1 capsule (360 mg total) by mouth daily.  . dorzolamide-timolol (COSOPT) 22.3-6.8 MG/ML ophthalmic solution Place 1 drop into both eyes at bedtime.   . famotidine (PEPCID) 20 MG tablet Take 1 tablet (20  mg total) by mouth 2 (two) times daily.  . furosemide (LASIX) 20 MG tablet TAKE (1) TABLET BY MOUTH ONCE DAILY.  Marland Kitchen ibuprofen (ADVIL,MOTRIN) 400 MG tablet Take 1 tablet (400 mg total) by mouth every 6 (six) hours as needed.  Marland Kitchen lisinopril (PRINIVIL,ZESTRIL) 20 MG tablet TAKE (1) TABLET BY MOUTH ONCE DAILY.  . meclizine (ANTIVERT) 25 MG tablet Take 1 tablet (25 mg total) by mouth 3 (three) times daily as needed. Dizziness  . pravastatin (PRAVACHOL) 80 MG tablet TAKE (1) TABLET BY MOUTH AT BEDTIME.  Marland Kitchen warfarin (COUMADIN) 5 MG tablet TAKE 1/2 TO 1 TABLET BY MOUTH DAILY OR AS DIRECTED.  . [DISCONTINUED] colchicine 0.6 MG tablet Take 0.6 mg by mouth daily as needed. Gout Flare Ups  . [DISCONTINUED] diltiazem (CARDIZEM CD) 360 MG 24 hr capsule TAKE (1) CAPSULE BY MOUTH ONCE DAILY.  . [DISCONTINUED] simvastatin (ZOCOR) 40 MG  tablet Take 40 mg by mouth at bedtime.     No facility-administered encounter medications on file as of 06/21/2018.     Allergies  Allergen Reactions  . Tramadol     Drowsiness     HPI  [name] was diagnosed with hypercalcemia in June 2019 when it was 10.4 mg/dL.  Patient denies any prior history of parathyroid dysfunction.  She has remote past history of nephrolithiasis at least on one occasion.  Corresponding PTH on May 15, 2018 was 65.  She has memory lapses, cannot remember the details of her medical history.  Lab Results  Component Value Date   BUN 13 05/11/2018   CREATININE 1.20 (H) 05/11/2018  -She is not taking any over-the-counter calcium supplements.  She is not on hydrochlorothiazide.  Her recent vitamin D status is not known. She does not have bone density scan to review.  - she eats dairy and green, leafy, vegetables on average amounts.  she does not have a family history of hypercalcemia, pituitary tumors, thyroid cancer, or osteoporosis.  -Patient denies loss of height, walks with a walker due to disequilibrium.  ROS:  Constitutional: + Progressive weight gain, no fatigue, no subjective hyperthermia, no subjective hypothermia Eyes: no blurry vision, no xerophthalmia ENT: no sore throat, no nodules palpated in throat, no dysphagia/odynophagia, no hoarseness Cardiovascular: no Chest Pain, no Shortness of Breath, no palpitations, no leg swelling Respiratory: no cough, no SOB Gastrointestinal: no Nausea/Vomiting/Diarhhea Musculoskeletal: no muscle/joint aches Skin: no rashes Neurological: no tremors, no numbness, no tingling, no dizziness Psychiatric: no depression, no anxiety  PE: BP (!) 162/80   Pulse 86   Ht 5\' 2"  (1.575 m)   Wt 194 lb (88 kg)   BMI 35.48 kg/m  Wt Readings from Last 3 Encounters:  06/21/18 194 lb (88 kg)  05/24/18 198 lb (89.8 kg)  05/18/18 180 lb (81.6 kg)   Constitutional:  + obese for height, not in acute distress,  normal state of mind Eyes: PERRLA, EOMI, no exophthalmos ENT: moist mucous membranes, no thyromegaly, no cervical lymphadenopathy Cardiovascular: normal precordial activity, Regular Rate and Rhythm, no Murmur/Rubs/Gallops Respiratory:  adequate breathing efforts, no gross chest deformity, Clear to auscultation bilaterally Gastrointestinal: abdomen soft, Non -tender, No distension, Bowel Sounds present Musculoskeletal:  + Walks with a cane, no gross deformities, strength intact in all four extremities Skin: moist, warm, no rashes Neurological: no tremor with outstretched hands, Deep tendon reflexes normal in all four extremities.   CMP ( most recent) CMP     Component Value Date/Time   NA 144 05/11/2018 1119  K 4.5 05/11/2018 1119   CL 105 05/11/2018 1119   CO2 24 05/11/2018 1119   GLUCOSE 101 (H) 05/11/2018 1119   GLUCOSE 152 (H) 09/22/2017 0529   BUN 13 05/11/2018 1119   CREATININE 1.20 (H) 05/11/2018 1119   CREATININE 1.17 (H) 04/12/2013 1244   CALCIUM 10.8 (H) 05/15/2018 1023   PROT 6.6 09/20/2017 1633   PROT 6.6 11/22/2016 0906   ALBUMIN 3.6 09/20/2017 1633   ALBUMIN 4.0 11/22/2016 0906   AST 26 09/20/2017 1633   ALT 20 09/20/2017 1633   ALKPHOS 69 09/20/2017 1633   BILITOT 1.0 09/20/2017 1633   BILITOT 0.2 11/22/2016 0906   GFRNONAA 40 (L) 05/11/2018 1119   GFRAA 46 (L) 05/11/2018 1119     Diabetic Labs (most recent): Lab Results  Component Value Date   HGBA1C 7.1 (H) 05/24/2018   HGBA1C 7.3 (H) 09/21/2017   HGBA1C 6.5 11/18/2016     Lipid Panel ( most recent) Lipid Panel     Component Value Date/Time   CHOL 116 05/24/2018 0955   TRIG 105 05/24/2018 0955   HDL 54 05/24/2018 0955   CHOLHDL 2.1 05/24/2018 0955   CHOLHDL 2.2 09/21/2017 0519   VLDL 42 (H) 09/21/2017 0519   LDLCALC 41 05/24/2018 0955     Assessment: 1. Hypercalcemia / Hyperparathyroidism  Plan: Patient has had several instances of elevated calcium, with the highest level being at 10.8  mg/dL. A corresponding intact PTH level was also high, at 65.  -She is not taking over-the-counter calcium supplements, her vitamin D status is not known.  Her remote past history of nephrolithiasis may not be related to her current hypercalcemia.  Her labs are more consistent with PTH related hypercalcemia although malignancy related hypercalcemia is common in this age group. -She will not need immediate intervention at this time.  She will be considered for repeat labs for PTH/calcium, magnesium, phosphorus, thyroid function tests and vitamin D and return in 4 weeks.   -He believes she is confirmed to have primary hyperparathyroidism, she is not a surgical candidate.  If she continues to have clinically significant hypercalcemia, she will be considered for Sensipar therapy. -She may not benefit from screening for osteoporosis.  - Time spent with the patient: 35 minutes, of which >50% was spent in obtaining information about her symptoms, reviewing her previous labs, evaluations, and treatments, counseling her about her hypercalcemia, and developing a plan to confirm the diagnosis and long term treatment as necessary.  [name] participated in the discussions, expressed understanding, and voiced agreement with the above plans.  All questions were answered to her satisfaction. she is encouraged to contact clinic should she have any questions or concerns prior to her return visit.  - Return in about 4 weeks (around 07/19/2018) for Follow up with Pre-visit Labs.   Glade Lloyd, MD [facility] [address] [place], [place] [address] Phone: [phone]  Fax: [phone]    This note was partially dictated with voice recognition software. Similar sounding words can be transcribed inadequately or may not  be corrected upon review.  06/21/2018, 1:14 PM

## 2018-06-26 ENCOUNTER — Ambulatory Visit (INDEPENDENT_AMBULATORY_CARE_PROVIDER_SITE_OTHER): Payer: Medicare HMO | Admitting: Pharmacist

## 2018-06-26 DIAGNOSIS — I495 Sick sinus syndrome: Secondary | ICD-10-CM | POA: Diagnosis not present

## 2018-06-26 DIAGNOSIS — I639 Cerebral infarction, unspecified: Secondary | ICD-10-CM

## 2018-06-26 DIAGNOSIS — Z5181 Encounter for therapeutic drug level monitoring: Secondary | ICD-10-CM | POA: Diagnosis not present

## 2018-06-26 LAB — POCT INR: INR: 2.4 (ref 2.0–3.0)

## 2018-06-26 NOTE — Patient Instructions (Signed)
Description   Continue coumadin 1/2 tablet daily Recheck in 6 weeks

## 2018-07-10 ENCOUNTER — Other Ambulatory Visit: Payer: Self-pay | Admitting: Internal Medicine

## 2018-07-10 ENCOUNTER — Ambulatory Visit (INDEPENDENT_AMBULATORY_CARE_PROVIDER_SITE_OTHER): Payer: Medicare HMO

## 2018-07-10 DIAGNOSIS — I639 Cerebral infarction, unspecified: Secondary | ICD-10-CM

## 2018-07-10 DIAGNOSIS — I495 Sick sinus syndrome: Secondary | ICD-10-CM

## 2018-07-12 LAB — CUP PACEART REMOTE DEVICE CHECK
Battery Remaining Longevity: 29 mo
Battery Remaining Percentage: 22 %
Battery Voltage: 2.78 V
Brady Statistic RV Percent Paced: 63 %
Date Time Interrogation Session: 20200203071124
Implantable Lead Implant Date: 20100602
Implantable Lead Implant Date: 20100602
Implantable Lead Location: 753859
Implantable Lead Location: 753860
Implantable Pulse Generator Implant Date: 20100602
Lead Channel Impedance Value: 460 Ohm
Lead Channel Pacing Threshold Amplitude: 0.5 V
Lead Channel Pacing Threshold Pulse Width: 0.4 ms
Lead Channel Sensing Intrinsic Amplitude: 12 mV
Lead Channel Setting Pacing Amplitude: 2.5 V
Lead Channel Setting Pacing Pulse Width: 0.4 ms
Lead Channel Setting Sensing Sensitivity: 2 mV
Pulse Gen Model: 2210
Pulse Gen Serial Number: 2323973

## 2018-07-13 ENCOUNTER — Encounter: Payer: Self-pay | Admitting: Internal Medicine

## 2018-07-13 ENCOUNTER — Ambulatory Visit: Payer: Medicare HMO | Admitting: Internal Medicine

## 2018-07-13 VITALS — BP 184/70 | HR 78 | Ht 65.0 in | Wt 192.2 lb

## 2018-07-13 DIAGNOSIS — I4891 Unspecified atrial fibrillation: Secondary | ICD-10-CM

## 2018-07-13 DIAGNOSIS — I495 Sick sinus syndrome: Secondary | ICD-10-CM | POA: Diagnosis not present

## 2018-07-13 LAB — CUP PACEART INCLINIC DEVICE CHECK
Battery Remaining Longevity: 27 mo
Battery Voltage: 2.78 V
Brady Statistic RA Percent Paced: 0 %
Brady Statistic RV Percent Paced: 63 %
Date Time Interrogation Session: 20200206141739
Implantable Lead Implant Date: 20100602
Implantable Lead Implant Date: 20100602
Implantable Lead Location: 753859
Implantable Lead Location: 753860
Implantable Pulse Generator Implant Date: 20100602
Lead Channel Pacing Threshold Amplitude: 1 V
Lead Channel Pacing Threshold Amplitude: 1 V
Lead Channel Pacing Threshold Pulse Width: 0.4 ms
Lead Channel Sensing Intrinsic Amplitude: 12 mV
Lead Channel Setting Pacing Amplitude: 2.5 V
Lead Channel Setting Pacing Pulse Width: 0.4 ms
Lead Channel Setting Sensing Sensitivity: 2 mV
MDC IDC MSMT LEADCHNL RV IMPEDANCE VALUE: 400 Ohm
MDC IDC MSMT LEADCHNL RV PACING THRESHOLD PULSEWIDTH: 0.4 ms
Pulse Gen Serial Number: 2323973

## 2018-07-13 NOTE — Progress Notes (Signed)
HPI [name] returns today for followup. She is a very pleasant 85 year old woman with a history of now chronic atrial fibrillation, symptomatic bradycardia, and remote strokes.  In the interim, she has been stable. She denies peripheral edema, cough, or chest pain. No syncope. She has dyspnea with exertion. She remains active cooking and cleaning her house. She occaisionally has some congestion.  Allergies  Allergen Reactions  . Tramadol     Drowsiness     Current Outpatient Medications  Medication Sig Dispense Refill  . albuterol (PROVENTIL HFA;VENTOLIN HFA) 108 (90 Base) MCG/ACT inhaler TAKE 2 PUFFS EVERY 6 HOURS AS NEEDED 18 g 5  . allopurinol (ZYLOPRIM) 100 MG tablet TAKE 1 TABLET BY MOUTH EVERY MONDAY, WEDNESDAY, AND FRIDAY. 12 tablet 3  . diazepam (VALIUM) 5 MG tablet Take 0.5 tablets (2.5 mg total) by mouth every 8 (eight) hours as needed (neck pain). 10 tablet 0  . diltiazem (TIAZAC) 360 MG 24 hr capsule Take 1 capsule (360 mg total) by mouth daily. 30 capsule 1  . dorzolamide-timolol (COSOPT) 22.3-6.8 MG/ML ophthalmic solution Place 1 drop into both eyes at bedtime.     . famotidine (PEPCID) 20 MG tablet Take 1 tablet (20 mg total) by mouth 2 (two) times daily. 60 tablet 5  . furosemide (LASIX) 20 MG tablet TAKE (1) TABLET BY MOUTH ONCE DAILY. 30 tablet 5  . ibuprofen (ADVIL,MOTRIN) 400 MG tablet Take 1 tablet (400 mg total) by mouth every 6 (six) hours as needed. 30 tablet 0  . lisinopril (PRINIVIL,ZESTRIL) 20 MG tablet TAKE (1) TABLET BY MOUTH ONCE DAILY. 90 tablet 0  . meclizine (ANTIVERT) 25 MG tablet Take 1 tablet (25 mg total) by mouth 3 (three) times daily as needed. Dizziness 30 tablet 2  . pravastatin (PRAVACHOL) 80 MG tablet TAKE (1) TABLET BY MOUTH AT BEDTIME. 90 tablet 0  . warfarin (COUMADIN) 5 MG tablet TAKE 1/2 TO 1 TABLET BY MOUTH DAILY OR AS DIRECTED. 30 tablet 1   No current facility-administered medications for this visit.      Past Medical  History:  Diagnosis Date  . Anemia    H/H of 11.3/36.7 in 12/2008 ;normal MCV; normal CBC in 2011  . Angioedema 08/2006  . Atrial fibrillation (HCC)    persistant  . Blood transfusion   . 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 Chronic bronchitis   . Chronic renal insufficiency    baseline creatine 1.4; 1.04 in 03/2010  . DJD (degenerative joint disease)    hands, knees  . Gout   . Hyperlipidemia   . Hypertension    heart disease diastolic dysfunction ; FW-26%; mild pulmonary edema in 2006;responded to diurectics ; LVH  . Kidney stones   . Mitral valve disease    Severe mitral annular calcification; mild to moderate stenosis-not clearly rheumatic  . Nephrolithiasis   . Pacemaker   . Retinal hemorrhage   . Seizure disorder ([place])   . Shortness of breath    only with exertion  . Stroke ([place])   . Tachycardia-bradycardia syndrome ([place])    with pauses and syncope;PPM implantation 10/2008,negative stress nuclear study 03/2005  . Vertigo     ROS:   All systems reviewed and negative except as noted in the HPI.   Past Surgical History:  Procedure Laterality Date  . CATARACT EXTRACTION W/PHACO  03/29/2011   Procedure: CATARACT EXTRACTION PHACO AND INTRAOCULAR LENS PLACEMENT (IOC);  Surgeon: Tonny Branch;  [facility];  Service: Ophthalmology;  Laterality: Right;  CDE 12.62  . INSERT / REPLACE / REMOVE PACEMAKER  11/2008   St.Jude DUAL chamber pacemaker 11/06/2008  . MEMBRANE PEEL Right 02/12/2014   Procedure: MEMBRANE PEEL;  Surgeon: [name], MD;  Location: [place];  Service: Ophthalmology;  Laterality: Right;  . PARS PLANA VITRECTOMY Right 02/12/2014   Procedure: PARS PLANA VITRECTOMY WITH 25 GAUGE;  Surgeon: [name], MD;  Location: [place];  Service: Ophthalmology;   Laterality: Right;  . TOTAL KNEE ARTHROPLASTY  04/2009   Right     Family History  Problem Relation Age of Onset  . Cancer Other        unknown cancer  . Hypotension Neg Hx   . Anesthesia problems Neg Hx   . Pseudochol deficiency Neg Hx   . Malignant hyperthermia Neg Hx      Social History   Socioeconomic History  . Marital status: Married    Spouse name: Not on file  . Number of children: Not on file  . Years of education: Not on file  . Highest education level: Not on file  Occupational History  . Not on file  Social Needs  . Financial resource strain: Not on file  . Food insecurity:    Worry: Not on file    Inability: Not on file  . Transportation needs:    Medical: Not on file    Non-medical: Not on file  Tobacco Use  . Smoking status: Never Smoker  . Smokeless tobacco: Never Used  Substance and Sexual Activity  . Alcohol use: No    Alcohol/week: 0.0 standard drinks  . Drug use: No  . Sexual activity: Not on file  Lifestyle  . Physical activity:    Days per week: Not on file    Minutes per session: Not on file  . Stress: Not on file  Relationships  . Social connections:    Talks on phone: Not on file    Gets together: Not on file    Attends religious service: Not on file    Active member of club or organization: Not on file    Attends meetings of clubs or organizations: Not on file    Relationship status: Not on file  . Intimate partner violence:    Fear of current or ex partner: Not on file    Emotionally abused: Not on file    Physically abused: Not on file    Forced sexual activity: Not on file  Other Topics Concern  . Not on file  Social History Narrative  . Not on file     BP (!) 184/70 (BP Location: Left Arm, Cuff Size: Large)   Pulse 78   Ht 5\' 5"  (1.651 m)   Wt 192 lb 3.2 oz (87.2 kg)   SpO2 97%   BMI 31.98 kg/m   Physical Exam:  Well appearing NAD HEENT: Unremarkable Neck:  6 cm JVD, no thyromegally Lymphatics:  No  adenopathy Back:  No CVA tenderness Lungs:  Clear with no wheezes HEART:  Regular rate rhythm, no murmurs, no rubs, no clicks Abd:  soft, positive bowel sounds, no organomegally, no rebound, no guarding Ext:  2 plus pulses, no edema, no cyanosis, no clubbing Skin:  No rashes no nodules Neuro:  CN II through XII intact,  motor grossly intact  DEVICE  Normal device function.  See PaceArt for details.   Assess/Plan: 1. Sinus node dysfunction - she is s/p PPM and asymptomatic.  2. HTN - her blood pressure is up but she took her meds just before she got to the office. I encouraged her to maintain a low sodium diet.  3. PPM - her [employer]. Jude device is working normally. She has just over 2 years on the battery.  Mikle Bosworth.D.

## 2018-07-13 NOTE — Patient Instructions (Signed)
Medication Instructions:  Your physician recommends that you continue on your current medications as directed. Please refer to the Current Medication list given to you today.  If you need a refill on your cardiac medications before your next appointment, please call your pharmacy.   Lab work: NONE  If you have labs (blood work) drawn today and your tests are completely normal, you will receive your results only by: Marland Kitchen MyChart Message (if you have MyChart) OR . A paper copy in the mail If you have any lab test that is abnormal or we need to change your treatment, we will call you to review the results.  Testing/Procedures: NONE   Follow-Up: At [facility], you and your health needs are our priority.  As part of our continuing mission to provide you with exceptional heart care, we have created designated Provider Care Teams.  These Care Teams include your primary Cardiologist (physician) and Advanced Practice Providers (APPs -  Physician Assistants and Nurse Practitioners) who all work together to provide you with the care you need, when you need it. You will need a follow up appointment in 1 years.  Please call our office 2 months in advance to schedule this appointment.  You may see Cristopher Peru, MD or one of the following Advanced Practice Providers on your designated Care Team:   Chanetta Marshall, NP . Tommye Standard, PA-C  Any Other Special Instructions Will Be Listed Below (If Applicable). Thank you for choosing [facility]!   Low-Sodium Eating Plan Sodium, which is an element that makes up salt, helps you maintain a healthy balance of fluids in your body. Too much sodium can increase your blood pressure and cause fluid and waste to be held in your body. Your health care provider or dietitian may recommend following this plan if you have high blood pressure (hypertension), kidney disease, liver disease, or heart failure. Eating less sodium can help lower your blood pressure,  reduce swelling, and protect your heart, liver, and kidneys. What are tips for following this plan? General guidelines  Most people on this plan should limit their sodium intake to 1,500-2,000 mg (milligrams) of sodium each day. Reading food labels   The Nutrition Facts label lists the amount of sodium in one serving of the food. If you eat more than one serving, you must multiply the listed amount of sodium by the number of servings.  Choose foods with less than 140 mg of sodium per serving.  Avoid foods with 300 mg of sodium or more per serving. Shopping  Look for lower-sodium products, often labeled as "low-sodium" or "no salt added."  Always check the sodium content even if foods are labeled as "unsalted" or "no salt added".  Buy fresh foods. ? Avoid canned foods and premade or frozen meals. ? Avoid canned, cured, or processed meats  Buy breads that have less than 80 mg of sodium per slice. Cooking  Eat more home-cooked food and less restaurant, buffet, and fast food.  Avoid adding salt when cooking. Use salt-free seasonings or herbs instead of table salt or sea salt. Check with your health care provider or pharmacist before using salt substitutes.  Cook with plant-based oils, such as canola, sunflower, or olive oil. Meal planning  When eating at a restaurant, ask that your food be prepared with less salt or no salt, if possible.  Avoid foods that contain MSG (monosodium glutamate). MSG is sometimes added to [place] food, bouillon, and some canned foods. What foods are recommended?  The items listed may not be a complete list. Talk with your dietitian about what dietary choices are best for you. Grains Low-sodium cereals, including oats, puffed wheat and rice, and shredded wheat. Low-sodium crackers. Unsalted rice. Unsalted pasta. Low-sodium bread. Whole-grain breads and whole-grain pasta. Vegetables Fresh or frozen vegetables. "No salt added" canned vegetables. "No salt  added" tomato sauce and paste. Low-sodium or reduced-sodium tomato and vegetable juice. Fruits Fresh, frozen, or canned fruit. Fruit juice. Meats and other protein foods Fresh or frozen (no salt added) meat, poultry, seafood, and fish. Low-sodium canned tuna and salmon. Unsalted nuts. Dried peas, beans, and lentils without added salt. Unsalted canned beans. Eggs. Unsalted nut butters. Dairy Milk. Soy milk. Cheese that is naturally low in sodium, such as ricotta cheese, fresh mozzarella, or Swiss cheese Low-sodium or reduced-sodium cheese. Cream cheese. Yogurt. Fats and oils Unsalted butter. Unsalted margarine with no trans fat. Vegetable oils such as canola or olive oils. Seasonings and other foods Fresh and dried herbs and spices. Salt-free seasonings. Low-sodium mustard and ketchup. Sodium-free salad dressing. Sodium-free light mayonnaise. Fresh or refrigerated horseradish. Lemon juice. Vinegar. Homemade, reduced-sodium, or low-sodium soups. Unsalted popcorn and pretzels. Low-salt or salt-free chips. What foods are not recommended? The items listed may not be a complete list. Talk with your dietitian about what dietary choices are best for you. Grains Instant hot cereals. Bread stuffing, pancake, and biscuit mixes. Croutons. Seasoned rice or pasta mixes. Noodle soup cups. Boxed or frozen macaroni and cheese. Regular salted crackers. Self-rising flour. Vegetables Sauerkraut, pickled vegetables, and relishes. Olives. [place] fries. Onion rings. Regular canned vegetables (not low-sodium or reduced-sodium). Regular canned tomato sauce and paste (not low-sodium or reduced-sodium). Regular tomato and vegetable juice (not low-sodium or reduced-sodium). Frozen vegetables in sauces. Meats and other protein foods Meat or fish that is salted, canned, smoked, spiced, or pickled. Bacon, ham, sausage, hotdogs, corned beef, chipped beef, packaged lunch meats, salt pork, jerky, pickled herring, anchovies, regular  canned tuna, sardines, salted nuts. Dairy Processed cheese and cheese spreads. Cheese curds. Blue cheese. Feta cheese. String cheese. Regular cottage cheese. Buttermilk. Canned milk. Fats and oils Salted butter. Regular margarine. Ghee. Bacon fat. Seasonings and other foods Onion salt, garlic salt, seasoned salt, table salt, and sea salt. Canned and packaged gravies. Worcestershire sauce. Tartar sauce. Barbecue sauce. Teriyaki sauce. Soy sauce, including reduced-sodium. Steak sauce. Fish sauce. Oyster sauce. Cocktail sauce. Horseradish that you find on the shelf. Regular ketchup and mustard. Meat flavorings and tenderizers. Bouillon cubes. Hot sauce and Tabasco sauce. Premade or packaged marinades. Premade or packaged taco seasonings. Relishes. Regular salad dressings. Salsa. Potato and tortilla chips. Corn chips and puffs. Salted popcorn and pretzels. Canned or dried soups. Pizza. Frozen entrees and pot pies. Summary  Eating less sodium can help lower your blood pressure, reduce swelling, and protect your heart, liver, and kidneys.  Most people on this plan should limit their sodium intake to 1,500-2,000 mg (milligrams) of sodium each day.  Canned, boxed, and frozen foods are high in sodium. Restaurant foods, fast foods, and pizza are also very high in sodium. You also get sodium by adding salt to food.  Try to cook at home, eat more fresh fruits and vegetables, and eat less fast food, canned, processed, or prepared foods. This information is not intended to replace advice given to you by your health care provider. Make sure you discuss any questions you have with your health care provider. Document Released: 11/13/2001 Document Revised: 05/17/2016 Document Reviewed: 05/17/2016 Elsevier  Interactive Patient Education  [employer].

## 2018-07-18 NOTE — Progress Notes (Signed)
Remote pacemaker transmission.   

## 2018-07-19 ENCOUNTER — Ambulatory Visit: Payer: Medicare HMO | Admitting: "Endocrinology

## 2018-07-24 ENCOUNTER — Other Ambulatory Visit: Payer: Self-pay | Admitting: Family Medicine

## 2018-07-24 DIAGNOSIS — Z961 Presence of intraocular lens: Secondary | ICD-10-CM | POA: Diagnosis not present

## 2018-07-24 DIAGNOSIS — H40053 Ocular hypertension, bilateral: Secondary | ICD-10-CM | POA: Diagnosis not present

## 2018-07-24 NOTE — Telephone Encounter (Signed)
When I looked at the note it states diazepam is what she should not take  It is fine to use diltiazem-blood pressure heart medicine May have this with 4 refills

## 2018-08-07 ENCOUNTER — Ambulatory Visit (INDEPENDENT_AMBULATORY_CARE_PROVIDER_SITE_OTHER): Payer: Medicare HMO | Admitting: *Deleted

## 2018-08-07 DIAGNOSIS — I4891 Unspecified atrial fibrillation: Secondary | ICD-10-CM

## 2018-08-07 DIAGNOSIS — I639 Cerebral infarction, unspecified: Secondary | ICD-10-CM

## 2018-08-07 DIAGNOSIS — I495 Sick sinus syndrome: Secondary | ICD-10-CM | POA: Diagnosis not present

## 2018-08-07 DIAGNOSIS — Z5181 Encounter for therapeutic drug level monitoring: Secondary | ICD-10-CM | POA: Diagnosis not present

## 2018-08-07 LAB — POCT INR: INR: 2 (ref 2.0–3.0)

## 2018-08-07 NOTE — Patient Instructions (Signed)
Take coumadin 1 tablet tonight then resume 1/2 tablet daily Recheck in 6 weeks

## 2018-08-14 ENCOUNTER — Other Ambulatory Visit: Payer: Self-pay | Admitting: Family Medicine

## 2018-08-14 ENCOUNTER — Other Ambulatory Visit: Payer: Self-pay | Admitting: Internal Medicine

## 2018-09-01 ENCOUNTER — Telehealth: Payer: Self-pay

## 2018-09-01 NOTE — Telephone Encounter (Signed)
I spoke with the pt sister and she agreed to let the patient know not to send everyday.

## 2018-09-11 ENCOUNTER — Ambulatory Visit (INDEPENDENT_AMBULATORY_CARE_PROVIDER_SITE_OTHER): Payer: Medicare HMO | Admitting: Family Medicine

## 2018-09-11 ENCOUNTER — Other Ambulatory Visit: Payer: Self-pay

## 2018-09-11 DIAGNOSIS — R51 Headache: Secondary | ICD-10-CM | POA: Diagnosis not present

## 2018-09-11 DIAGNOSIS — R519 Headache, unspecified: Secondary | ICD-10-CM

## 2018-09-11 MED ORDER — HYDROCODONE-ACETAMINOPHEN 5-325 MG PO TABS: ORAL_TABLET | ORAL | 0 refills | Status: DC

## 2018-09-11 NOTE — Progress Notes (Signed)
   Subjective:    Patient ID: MYRENE BOUGHER, female    DOB: 1927-05-13, 85 y.o.   MRN: [id number]  HPI  Patient states she has been having problems with headaches for last week. Patient states they have been giving her a fit- it is some better today but yesterday was bad.  Telephone visit only patient did not have video capabilities Virtual Visit via Video Note  I connected with ROZELL KETTLEWELL on 09/11/18 at 11:00 AM EDT by a video enabled telemedicine application and verified that I am speaking with the correct person using two identifiers.   I discussed the limitations of evaluation and management by telemedicine and the availability of in person appointments. The patient expressed understanding and agreed to proceed.  History of Present Illness:    Observations/Objective:   Assessment and Plan:   Follow Up Instructions:    I discussed the assessment and treatment plan with the patient. The patient was provided an opportunity to ask questions and all were answered. The patient agreed with the plan and demonstrated an understanding of the instructions.   The patient was advised to call back or seek an in-person evaluation if the symptoms worsen or if the condition fails to improve as anticipated.  I provided 15 minutes of non-face-to-face time during this encounter.    Patient denies any unilateral numbness denies any unilateral weakness.  Relates back overall feels fairly good relates headaches sometimes worse on the right side than left side does not wake her up at night no tenderness in the scalp no tenderness in the temporal region   Review of Systems     Objective:   Physical Exam  I did try to call her daughter [name] left a message for her to call us to inform her to check blood pressure occasionally and that we will try a low-dose pain medicine when the headaches are severe but otherwise stick with Tylenol if the headache medicine causes too much drowsiness  stop the medicine      Assessment & Plan:  Taking Tylenol currently when headache is severe may use a half of a tablet of the hydrocodone as long as it does not cause excessive drowsiness If her headaches persist next step would be consideration of a sedimentation rate Patient had negative CAT scan last year I do not feel CAT scan necessary Her daughter-Phyllis will check her blood pressure occasionally and let us know the results We will do a follow-up phone visit within 10 days

## 2018-09-12 ENCOUNTER — Telehealth: Payer: Self-pay | Admitting: Family Medicine

## 2018-09-12 NOTE — Telephone Encounter (Signed)
Was told to call back today with blood pressure reading.  It was 152/50 when they checked last night.

## 2018-09-12 NOTE — Telephone Encounter (Signed)
Pt daughter contacted. Pt daughter states she is going to call her sister to set up the appt with Dr.Scott. pt daughter verbalized understanding.

## 2018-09-12 NOTE — Telephone Encounter (Signed)
Blood pressure shows top numbers slightly elevated.  Problem number looks good.  Given the patient's age I would not recommend increasing the medication currently.  I recommend checking blood pressure once or twice a week for the next couple weeks Please call to schedule for a follow-up virtual office visit weeks from now for her headaches and blood pressure

## 2018-09-15 ENCOUNTER — Other Ambulatory Visit: Payer: Self-pay | Admitting: Internal Medicine

## 2018-09-15 ENCOUNTER — Telehealth: Payer: Self-pay | Admitting: *Deleted

## 2018-09-15 NOTE — Telephone Encounter (Signed)
° °  COVID-19 Pre-Screening Questions: ° °• Do you currently have a fever?NO ° ° °• Have you recently travelled on a cruise, internationally, or to [place], [place], [place], [place], [place], or [place], [place] ([employer]) ? NO °•  °• Have you been in contact with someone that is currently pending confirmation of Covid19 testing or has been confirmed to have the Covid19 virus?  NO °•  °Are you currently experiencing fatigue or cough? NO ° ° °   ° ° ° ° °

## 2018-09-18 ENCOUNTER — Ambulatory Visit (INDEPENDENT_AMBULATORY_CARE_PROVIDER_SITE_OTHER): Payer: Medicare HMO | Admitting: *Deleted

## 2018-09-18 DIAGNOSIS — I495 Sick sinus syndrome: Secondary | ICD-10-CM | POA: Diagnosis not present

## 2018-09-18 DIAGNOSIS — Z5181 Encounter for therapeutic drug level monitoring: Secondary | ICD-10-CM

## 2018-09-18 DIAGNOSIS — I639 Cerebral infarction, unspecified: Secondary | ICD-10-CM

## 2018-09-18 LAB — POCT INR: INR: 2.2 (ref 2.0–3.0)

## 2018-09-18 NOTE — Patient Instructions (Signed)
Continue coumadin 1/2 tablet daily Recheck in 6 weeks

## 2018-10-07 ENCOUNTER — Other Ambulatory Visit: Payer: Self-pay | Admitting: Internal Medicine

## 2018-10-10 ENCOUNTER — Encounter (HOSPITAL_COMMUNITY): Payer: Self-pay

## 2018-10-10 ENCOUNTER — Emergency Department (HOSPITAL_COMMUNITY): Payer: Medicare HMO

## 2018-10-10 ENCOUNTER — Encounter: Payer: Self-pay | Admitting: Family Medicine

## 2018-10-10 ENCOUNTER — Other Ambulatory Visit: Payer: Self-pay

## 2018-10-10 ENCOUNTER — Ambulatory Visit (INDEPENDENT_AMBULATORY_CARE_PROVIDER_SITE_OTHER): Payer: Medicare HMO | Admitting: Family Medicine

## 2018-10-10 ENCOUNTER — Emergency Department (HOSPITAL_COMMUNITY)
Admission: EM | Admit: 2018-10-10 | Discharge: 2018-10-10 | Disposition: A | Discharge status: Home / Self Care | Payer: Medicare HMO | Attending: Emergency Medicine | Admitting: Emergency Medicine

## 2018-10-10 DIAGNOSIS — I10 Essential (primary) hypertension: Secondary | ICD-10-CM | POA: Diagnosis not present

## 2018-10-10 DIAGNOSIS — Z8673 Personal history of transient ischemic attack (TIA), and cerebral infarction without residual deficits: Secondary | ICD-10-CM | POA: Insufficient documentation

## 2018-10-10 DIAGNOSIS — R1010 Upper abdominal pain, unspecified: Secondary | ICD-10-CM | POA: Diagnosis not present

## 2018-10-10 DIAGNOSIS — Z7901 Long term (current) use of anticoagulants: Secondary | ICD-10-CM | POA: Diagnosis not present

## 2018-10-10 DIAGNOSIS — Z96651 Presence of right artificial knee joint: Secondary | ICD-10-CM | POA: Insufficient documentation

## 2018-10-10 DIAGNOSIS — Z950 Presence of cardiac pacemaker: Secondary | ICD-10-CM | POA: Diagnosis not present

## 2018-10-10 DIAGNOSIS — R109 Unspecified abdominal pain: Secondary | ICD-10-CM

## 2018-10-10 DIAGNOSIS — N281 Cyst of kidney, acquired: Secondary | ICD-10-CM | POA: Diagnosis not present

## 2018-10-10 DIAGNOSIS — R1031 Right lower quadrant pain: Secondary | ICD-10-CM | POA: Diagnosis not present

## 2018-10-10 LAB — URINALYSIS, ROUTINE W REFLEX MICROSCOPIC
Bilirubin Urine: NEGATIVE
Glucose, UA: NEGATIVE mg/dL
Ketones, ur: NEGATIVE mg/dL
Leukocytes,Ua: NEGATIVE
Nitrite: NEGATIVE
Protein, ur: 30 mg/dL — AB
Specific Gravity, Urine: 1.016 (ref 1.005–1.030)
pH: 5 (ref 5.0–8.0)

## 2018-10-10 LAB — CBC WITH DIFFERENTIAL/PLATELET
Abs Immature Granulocytes: 0.03 10*3/uL (ref 0.00–0.07)
Basophils Absolute: 0 10*3/uL (ref 0.0–0.1)
Basophils Relative: 0 %
Eosinophils Absolute: 0 10*3/uL (ref 0.0–0.5)
Eosinophils Relative: 0 %
HCT: 40.9 % (ref 36.0–46.0)
Hemoglobin: 13 g/dL (ref 12.0–15.0)
Immature Granulocytes: 0 %
Lymphocytes Relative: 16 %
Lymphs Abs: 1.5 10*3/uL (ref 0.7–4.0)
MCH: 28.3 pg (ref 26.0–34.0)
MCHC: 31.8 g/dL (ref 30.0–36.0)
MCV: 89.1 fL (ref 80.0–100.0)
Monocytes Absolute: 0.7 10*3/uL (ref 0.1–1.0)
Monocytes Relative: 7 %
Neutro Abs: 7.1 10*3/uL (ref 1.7–7.7)
Neutrophils Relative %: 77 %
Platelets: 194 10*3/uL (ref 150–400)
RBC: 4.59 MIL/uL (ref 3.87–5.11)
RDW: 13 % (ref 11.5–15.5)
WBC: 9.4 10*3/uL (ref 4.0–10.5)
nRBC: 0 % (ref 0.0–0.2)

## 2018-10-10 LAB — COMPREHENSIVE METABOLIC PANEL
ALT: 9 U/L (ref 0–44)
AST: 19 U/L (ref 15–41)
Albumin: 4.2 g/dL (ref 3.5–5.0)
Alkaline Phosphatase: 66 U/L (ref 38–126)
Anion gap: 10 (ref 5–15)
BUN: 30 mg/dL — ABNORMAL HIGH (ref 8–23)
CO2: 28 mmol/L (ref 22–32)
Calcium: 11 mg/dL — ABNORMAL HIGH (ref 8.9–10.3)
Chloride: 101 mmol/L (ref 98–111)
Creatinine, Ser: 1.94 mg/dL — ABNORMAL HIGH (ref 0.44–1.00)
GFR calc Af Amer: 26 mL/min — ABNORMAL LOW (ref >60)
GFR calc non Af Amer: 22 mL/min — ABNORMAL LOW (ref >60)
Glucose, Bld: 150 mg/dL — ABNORMAL HIGH (ref 70–99)
Potassium: 4.8 mmol/L (ref 3.5–5.1)
Sodium: 139 mmol/L (ref 135–145)
Total Bilirubin: 0.4 mg/dL (ref 0.3–1.2)
Total Protein: 8 g/dL (ref 6.5–8.1)

## 2018-10-10 LAB — LIPASE, BLOOD: Lipase: 50 U/L (ref 11–51)

## 2018-10-10 MED ORDER — HYDROCODONE-ACETAMINOPHEN 5-325 MG PO TABS: 2.0000 | ORAL_TABLET | ORAL | 0 refills | Status: DC | PRN

## 2018-10-10 MED ORDER — SODIUM CHLORIDE 0.9 % IV BOLUS
500.0000 mL | Freq: Once | INTRAVENOUS | Status: AC
  Administered 2018-10-10: 14:00:00 500 mL via INTRAVENOUS

## 2018-10-10 MED ORDER — HYDROMORPHONE HCL 1 MG/ML IJ SOLN
0.5000 mg | Freq: Once | INTRAMUSCULAR | Status: AC
  Administered 2018-10-10: 12:00:00 0.5 mg via INTRAVENOUS
  Filled 2018-10-10: qty 1

## 2018-10-10 MED ORDER — SODIUM CHLORIDE 0.9 % IV SOLN
INTRAVENOUS | Status: DC
  Administered 2018-10-10: 12:00:00 via INTRAVENOUS

## 2018-10-10 NOTE — ED Triage Notes (Signed)
Pt presents to ED with RLQ abdominal pain which started at 0100. Pt states she also had vomiting at that time. Pt denies diarrhea.

## 2018-10-10 NOTE — Progress Notes (Signed)
   Subjective:    Patient ID: [name], female    DOB: 01/09/1927, 85 y.o.   MRN: [id number]  HPI Patient reports she got to feeling nauseated and vomited last night. Patient states she felt really bad last night and now she still feels burning and acid in her stomach. Patient has not vomited this am but has a lot of burning.  Virtual Visit via Video Note  I connected with [name] on 10/10/18 at  9:30 AM EDT by a video enabled telemedicine application and verified that I am speaking with the correct person using two identifiers.  Location: Patient: home Provider: office    I discussed the limitations of evaluation and management by telemedicine and the availability of in person appointments. The patient expressed understanding and agreed to proceed.  History of Present Illness:    Observations/Objective:   Assessment and Plan:   Follow Up Instructions:    I discussed the assessment and treatment plan with the patient. The patient was provided an opportunity to ask questions and all were answered. The patient agreed with the plan and demonstrated an understanding of the instructions.   The patient was advised to call back or seek an in-person evaluation if the symptoms worsen or if the condition fails to improve as anticipated.  I provided 25 minutes of non-face-to-face time during this encounter.   Patient is an 85 year old nice lady who unfortunately last evening developed upper abdominal pain.  Describes it as burning.  Accompanied by nausea.  Then also vomiting.  No obvious fever no obvious chills.  Awoke this morning feeling "terrible".  Notes 0 appetite.  Ongoing burning sensation in the abdomen.  Positive nausea.  No vomiting this morning no obvious fever or chills.    Review of Systems No rash no cough no shortness of breath    Objective:   Physical Exam   Virtual visit     Assessment & Plan:  Impression concerning combination of symptoms  nausea vomiting abdominal pain anorexia in an 85 year old patient.  Long discussion held.  Recommend ER evaluation.  I spoke with the ER doctor.  We spoke with the triage nurse.  I spoke with Dr. Nicki Reaper.  Patient in agreement.  Greater than 50% of this 25 minute face to face visit was spent in counseling and discussion and coordination of care regarding the above diagnosis/diagnosies

## 2018-10-13 NOTE — ED Provider Notes (Signed)
[facility] EMERGENCY DEPARTMENT Provider Note   CSN: [id number] Arrival date & time: 10/10/18  1042    History   Chief Complaint Chief Complaint  Patient presents with  . Abdominal Pain    HPI [name] is a 85 y.o. female.   HPI  89yF with abdominal pain. RLQ to R flank. Onset this morning around 0100. Persistent since then. Vomited once this morning but not since. No fever. No diarrhea. No urinary complaints. Hasn't tried taking anything for symptoms.   Past Medical History:  Diagnosis Date  . Anemia    H/H of 11.3/36.7 in 12/2008 ;normal MCV; normal CBC in 2011  . Angioedema 08/2006  . Atrial fibrillation (HCC)    persistant  . Blood transfusion   . 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 Chronic bronchitis   . Chronic renal insufficiency    baseline creatine 1.4; 1.04 in 03/2010  . DJD (degenerative joint disease)    hands, knees  . Gout   . Hyperlipidemia   . Hypertension    heart disease diastolic dysfunction ; OE-32%; mild pulmonary edema in 2006;responded to diurectics ; LVH  . Kidney stones   . Mitral valve disease    Severe mitral annular calcification; mild to moderate stenosis-not clearly rheumatic  . Nephrolithiasis   . Pacemaker   . Retinal hemorrhage   . Seizure disorder ([place])   . Shortness of breath    only with exertion  . Stroke ([place])   . Tachycardia-bradycardia syndrome ([place])    with pauses and syncope;PPM implantation 10/2008,negative stress nuclear study 03/2005  . Vertigo     Patient Active Problem List   Diagnosis Date Noted  . Thyroid nodule 10/04/2017  . Right sided weakness   . Acute ischemic stroke ([place]) 09/20/2017  . Controlled type 2 diabetes mellitus with complication, without long-term current use of insulin  ([place]) 07/08/2016  . Prediabetes 12/25/2015  . 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 Vitreous hemorrhage ([place]) 02/12/2014  . TIA (transient ischemic attack) 08/20/2013  . Encounter for therapeutic drug monitoring 06/28/2013  . Gout 11/14/2012  . Fasting hyperglycemia 04/06/2012  . Atrial fibrillation ([place]) 08/31/2011  . Chronic anticoagulation 12/16/2010  . 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 Cerebrovascular accident ([place])   . DJD (degenerative joint disease)   . Hypertension 02/21/2009  . Sick sinus syndrome ([place]) 02/21/2009  . Hyperlipidemia 11/26/2008  . SEIZURE DISORDER 10/31/2008    Past Surgical History:  Procedure Laterality Date  . CATARACT EXTRACTION W/PHACO  03/29/2011   Procedure: CATARACT EXTRACTION PHACO AND INTRAOCULAR LENS PLACEMENT (IOC);  Surgeon: Tonny Branch;  Location: [facility];  Service: Ophthalmology;  Laterality: Right;  CDE 12.62  . INSERT / REPLACE / REMOVE PACEMAKER  11/2008   St.Jude DUAL chamber pacemaker 11/06/2008  . MEMBRANE PEEL Right 02/12/2014   Procedure: MEMBRANE PEEL;  Surgeon: [name], MD;  Location: [facility];  Service: Ophthalmology;  Laterality: Right;  . PARS PLANA VITRECTOMY Right 02/12/2014   Procedure: PARS PLANA VITRECTOMY WITH 25 GAUGE;  Surgeon: [name], MD;  Location: [facility];  Service: Ophthalmology;  Laterality: Right;  . TOTAL KNEE ARTHROPLASTY  04/2009   Right     OB History    Gravida  5   Para  5   Term  5  Preterm      AB      Living  5     SAB      TAB      Ectopic      Multiple      Live Births               Home Medications    Prior to Admission medications   Medication Sig Start Date End Date Taking? Authorizing Provider  albuterol (PROVENTIL HFA;VENTOLIN HFA) 108 (90 Base) MCG/ACT inhaler TAKE 2 PUFFS EVERY 6 HOURS AS NEEDED 06/16/18  Yes Luking, Scott A, MD  diltiazem (TIAZAC) 360 MG 24 hr capsule TAKE ONE CAPSULE BY MOUTH ONCE DAILY. 07/24/18  Yes Luking, Elayne Snare, MD  furosemide (LASIX)  20 MG tablet TAKE (1) TABLET BY MOUTH ONCE DAILY. 08/14/18  Yes Luking, Scott A, MD  lisinopril (ZESTRIL) 20 MG tablet TAKE (1) TABLET BY MOUTH ONCE DAILY. 10/09/18  Yes Evans Lance, MD  pravastatin (PRAVACHOL) 80 MG tablet TAKE (1) TABLET BY MOUTH AT BEDTIME. 05/08/18  Yes Luking, Scott A, MD  Travoprost, BAK Free, (TRAVATAN) 0.004 % SOLN ophthalmic solution Place 1 drop into both eyes at bedtime.    Yes [provider]  warfarin (COUMADIN) 5 MG tablet TAKE 1/2 TO 1 TABLET BY MOUTH DAILY OR AS DIRECTED. 09/15/18  Yes Branch, Alphonse Guild, MD  allopurinol (ZYLOPRIM) 100 MG tablet TAKE 1 TABLET BY MOUTH EVERY MONDAY, [place], AND FRIDAY. Patient not taking: Reported on 10/10/2018 11/16/17   Kathyrn Drown, MD  HYDROcodone-acetaminophen (NORCO/VICODIN) 5-325 MG tablet Take 2 tablets by mouth every 4 (four) hours as needed. 10/10/18   Virgel Manifold, MD    Family History Family History  Problem Relation Age of Onset  . Cancer Other        unknown cancer  . Hypotension Neg Hx   . Anesthesia problems Neg Hx   . Pseudochol deficiency Neg Hx   . Malignant hyperthermia Neg Hx     Social History Social History   Tobacco Use  . Smoking status: Never Smoker  . Smokeless tobacco: Never Used  Substance Use Topics  . Alcohol use: No    Alcohol/week: 0.0 standard drinks  . Drug use: No     Allergies   Tramadol   Review of Systems Review of Systems  All systems reviewed and negative, other than as noted in HPI.  Physical Exam Updated Vital Signs BP (!) 150/89 (BP Location: Right Arm)   Pulse 92   Temp 98.9 F (37.2 C) (Oral)   Resp 18   Ht 5\' 2"  (1.575 m)   Wt 81.6 kg   SpO2 99%   BMI 32.92 kg/m   Physical Exam Vitals signs and nursing note reviewed.  Constitutional:      General: She is not in acute distress.    Appearance: She is well-developed.  HENT:     Head: Normocephalic and atraumatic.  Eyes:     General:        Right eye: No discharge.        Left eye: No  discharge.     Conjunctiva/sclera: Conjunctivae normal.  Neck:     Musculoskeletal: Neck supple.  Cardiovascular:     Rate and Rhythm: Normal rate and regular rhythm.     Heart sounds: Normal heart sounds. No murmur. No friction rub. No gallop.   Pulmonary:     Effort: Pulmonary effort is normal. No respiratory distress.     Breath  sounds: Normal breath sounds.  Abdominal:     General: There is no distension.     Palpations: Abdomen is soft.     Tenderness: There is abdominal tenderness in the right lower quadrant.     Comments: Mild RLQ tenderness w/o rebound or guarding  Musculoskeletal:        General: No tenderness.  Skin:    General: Skin is warm and dry.  Neurological:     Mental Status: She is alert.  Psychiatric:        Behavior: Behavior normal.        Thought Content: Thought content normal.      ED Treatments / Results  Labs (all labs ordered are listed, but only abnormal results are displayed) Labs Reviewed  URINALYSIS, ROUTINE W REFLEX MICROSCOPIC - Abnormal; Notable for the following components:      Result Value   Hgb urine dipstick SMALL (*)    Protein, ur 30 (*)    Bacteria, UA RARE (*)    All other components within normal limits  COMPREHENSIVE METABOLIC PANEL - Abnormal; Notable for the following components:   Glucose, Bld 150 (*)    BUN 30 (*)    Creatinine, Ser 1.94 (*)    Calcium 11.0 (*)    GFR calc non Af Amer 22 (*)    GFR calc Af Amer 26 (*)    All other components within normal limits  LIPASE, BLOOD  CBC WITH DIFFERENTIAL/PLATELET    EKG None  Radiology No results found.  Procedures Procedures (including critical care time)  Medications Ordered in ED Medications  HYDROmorphone (DILAUDID) injection 0.5 mg (0.5 mg Intravenous Given 10/10/18 1209)  sodium chloride 0.9 % bolus 500 mL (0 mLs Intravenous Stopped 10/10/18 1447)     Initial Impression / Assessment and Plan / ED Course  I have reviewed the triage vital signs and the  nursing notes.  Pertinent labs & imaging results that were available during my care of the patient were reviewed by me and considered in my medical decision making (see chart for details).   89yF with abdominal pain. Improved. W/u including CT a/p and UA pretty unremarkable. Potentially related to cholelithiasis? Tenderness is lower than I would expect. Regardless, I think fine for outpt FU. PRN pain meds. Return precautions discussed.   Final Clinical Impressions(s) / ED Diagnoses   Final diagnoses:  Abdominal pain, unspecified abdominal location    ED Discharge Orders         Ordered    HYDROcodone-acetaminophen (NORCO/VICODIN) 5-325 MG tablet  Every 4 hours PRN     10/10/18 1414           Virgel Manifold, MD 10/13/18 1713

## 2018-10-16 ENCOUNTER — Ambulatory Visit (INDEPENDENT_AMBULATORY_CARE_PROVIDER_SITE_OTHER): Payer: Medicare HMO | Admitting: *Deleted

## 2018-10-16 ENCOUNTER — Other Ambulatory Visit: Payer: Self-pay

## 2018-10-16 DIAGNOSIS — I495 Sick sinus syndrome: Secondary | ICD-10-CM

## 2018-10-16 DIAGNOSIS — I639 Cerebral infarction, unspecified: Secondary | ICD-10-CM

## 2018-10-17 LAB — CUP PACEART REMOTE DEVICE CHECK
Date Time Interrogation Session: 20200512103030
Implantable Lead Implant Date: 20100602
Implantable Lead Implant Date: 20100602
Implantable Lead Location: 753859
Implantable Lead Location: 753860
Implantable Pulse Generator Implant Date: 20100602
Pulse Gen Model: 2210
Pulse Gen Serial Number: 2323973

## 2018-10-23 ENCOUNTER — Other Ambulatory Visit: Payer: Self-pay

## 2018-10-23 ENCOUNTER — Ambulatory Visit (INDEPENDENT_AMBULATORY_CARE_PROVIDER_SITE_OTHER): Payer: Medicare HMO | Admitting: Family Medicine

## 2018-10-23 DIAGNOSIS — R630 Anorexia: Secondary | ICD-10-CM | POA: Diagnosis not present

## 2018-10-23 DIAGNOSIS — R109 Unspecified abdominal pain: Secondary | ICD-10-CM

## 2018-10-23 NOTE — Progress Notes (Signed)
   Subjective:    Patient ID: [name], female    DOB: 29-Apr-1927, 85 y.o.   MRN: [id number] Video was not capable for the patient HPI Pt went to ER on 10/10/2018 for abdominal pain. Pt needing a follow up. Pt states she is not having any pain at this moment. Pt has not regained appetite back at this time. No fever, no cough, no diarrhea, no constipation. She relates no bloody stools she states her appetite is just not there she states this is been going on for a while she denies any chest tightness pressure pain shortness of breath she does state no blood in her bowel movements  Denies sweats chills fevers She eats oatmeal in the morning time check a little soup at lunch she does not feel like eating supper Virtual Visit via Video Note  I connected with [name] on 10/23/18 at  1:40 PM EDT by a video enabled telemedicine application and verified that I am speaking with the correct person using two identifiers. She denies all abdominal discomforts currently Location: Patient: home Provider: office   I discussed the limitations of evaluation and management by telemedicine and the availability of in person appointments. The patient expressed understanding and agreed to proceed.   History of Present Illness:    Observations/Objective:   Assessment and Plan:   Follow Up Instructions:    I discussed the assessment and treatment plan with the patient. The patient was provided an opportunity to ask questions and all were answered. The patient agreed with the plan and demonstrated an understanding of the instructions.   The patient was advised to call back or seek an in-person evaluation if the symptoms worsen or if the condition fails to improve as anticipated.  I provided 15 minutes of non-face-to-face time during this encounter.   Vicente Males, LPN    Review of Systems  Constitutional: Negative for activity change, fatigue and fever.  HENT: Negative for  congestion and rhinorrhea.   Respiratory: Negative for cough, chest tightness and shortness of breath.   Cardiovascular: Negative for chest pain and leg swelling.  Gastrointestinal: Negative for abdominal pain and nausea.  Skin: Negative for color change.  Neurological: Negative for dizziness and headaches.  Psychiatric/Behavioral: Negative for agitation and behavioral problems.       Objective:   Physical Exam  Unable to do physical exam via telephone      Assessment & Plan:  Abdominal pain resolved I went over the warning signs to watch for if she has progressive troubles follow-up  Very important for the patient to eat supper and try to get enough calories then we will do a office visit in approximately 6 weeks to see how her weight is doing

## 2018-10-23 NOTE — Progress Notes (Signed)
Remote pacemaker transmission.   

## 2018-11-01 ENCOUNTER — Ambulatory Visit (INDEPENDENT_AMBULATORY_CARE_PROVIDER_SITE_OTHER): Payer: Medicare HMO | Admitting: *Deleted

## 2018-11-01 DIAGNOSIS — I495 Sick sinus syndrome: Secondary | ICD-10-CM | POA: Diagnosis not present

## 2018-11-01 DIAGNOSIS — Z5181 Encounter for therapeutic drug level monitoring: Secondary | ICD-10-CM | POA: Diagnosis not present

## 2018-11-01 DIAGNOSIS — I639 Cerebral infarction, unspecified: Secondary | ICD-10-CM

## 2018-11-01 DIAGNOSIS — I4891 Unspecified atrial fibrillation: Secondary | ICD-10-CM

## 2018-11-01 LAB — POCT INR: INR: 2.2 (ref 2.0–3.0)

## 2018-11-01 NOTE — Patient Instructions (Signed)
Continue coumadin 1/2 tablet daily Recheck in 6 weeks

## 2018-11-03 DIAGNOSIS — I70293 Other atherosclerosis of native arteries of extremities, bilateral legs: Secondary | ICD-10-CM | POA: Diagnosis not present

## 2018-11-03 DIAGNOSIS — L84 Corns and callosities: Secondary | ICD-10-CM | POA: Diagnosis not present

## 2018-11-03 DIAGNOSIS — B351 Tinea unguium: Secondary | ICD-10-CM | POA: Diagnosis not present

## 2018-11-13 ENCOUNTER — Other Ambulatory Visit: Payer: Self-pay | Admitting: Family Medicine

## 2018-11-16 ENCOUNTER — Other Ambulatory Visit: Payer: Self-pay

## 2018-11-16 ENCOUNTER — Ambulatory Visit (INDEPENDENT_AMBULATORY_CARE_PROVIDER_SITE_OTHER): Payer: Medicare HMO | Admitting: Family Medicine

## 2018-11-16 DIAGNOSIS — M1000 Idiopathic gout, unspecified site: Secondary | ICD-10-CM | POA: Diagnosis not present

## 2018-11-16 MED ORDER — PREDNISONE 10 MG PO TABS: ORAL_TABLET | ORAL | 0 refills | Status: DC

## 2018-11-16 MED ORDER — ALLOPURINOL 100 MG PO TABS: ORAL_TABLET | ORAL | 11 refills | Status: DC

## 2018-11-16 MED ORDER — CEPHALEXIN 500 MG PO CAPS: ORAL_CAPSULE | ORAL | 0 refills | Status: DC

## 2018-11-16 NOTE — Progress Notes (Signed)
   Subjective:    Patient ID: [name], female    DOB: [birth year], 85 y.o.   MRN: [id number]  Foot Pain This is a new problem. Episode onset: 2 days. Associated symptoms comments: Right foot swelling and pain, has to walk on her heel,.  started 2 days ago. Little toe was sore. Pt thinks it is gout. Use to take allopurinol but has been out of med for 3 -4 months.   Virtual Visit via Telephone Note  I connected with [name] on 11/16/18 at  2:00 PM EDT by telephone and verified that I am speaking with the correct person using two identifiers.  Location: Patient: home Provider: office   I discussed the limitations, risks, security and privacy concerns of performing an evaluation and management service by telephone and the availability of in person appointments. I also discussed with the patient that there may be a patient responsible charge related to this service. The patient expressed understanding and agreed to proceed.   History of Present Illness:    Observations/Objective:   Assessment and Plan:   Follow Up Instructions:    I discussed the assessment and treatment plan with the patient. The patient was provided an opportunity to ask questions and all were answered. The patient agreed with the plan and demonstrated an understanding of the instructions.   The patient was advised to call back or seek an in-person evaluation if the symptoms worsen or if the condition fails to improve as anticipated.  I provided 15 minutes of non-face-to-face time during this encounter.    Patient has history of gout.  Tach is a bit different on the smaller toe.  Usually gout attacks on the greater toe.  Recalls no injury.  Notes no break in the skin.  States pain and swelling very similar to usual gout.  Also ran out of her allopurinol warning signs discussed

## 2018-11-18 ENCOUNTER — Other Ambulatory Visit: Payer: Self-pay | Admitting: Family Medicine

## 2018-12-07 ENCOUNTER — Other Ambulatory Visit: Payer: Self-pay

## 2018-12-07 ENCOUNTER — Telehealth: Payer: Self-pay | Admitting: Cardiology

## 2018-12-07 ENCOUNTER — Ambulatory Visit (INDEPENDENT_AMBULATORY_CARE_PROVIDER_SITE_OTHER): Payer: Medicare HMO | Admitting: Family Medicine

## 2018-12-07 DIAGNOSIS — E785 Hyperlipidemia, unspecified: Secondary | ICD-10-CM

## 2018-12-07 DIAGNOSIS — Z7901 Long term (current) use of anticoagulants: Secondary | ICD-10-CM

## 2018-12-07 DIAGNOSIS — M1000 Idiopathic gout, unspecified site: Secondary | ICD-10-CM

## 2018-12-07 DIAGNOSIS — I4891 Unspecified atrial fibrillation: Secondary | ICD-10-CM

## 2018-12-07 DIAGNOSIS — E118 Type 2 diabetes mellitus with unspecified complications: Secondary | ICD-10-CM | POA: Diagnosis not present

## 2018-12-07 DIAGNOSIS — I10 Essential (primary) hypertension: Secondary | ICD-10-CM

## 2018-12-07 MED ORDER — DILTIAZEM HCL ER BEADS 360 MG PO CP24: 360.0000 mg | ORAL_CAPSULE | Freq: Every day | ORAL | 1 refills | Status: DC

## 2018-12-07 MED ORDER — FUROSEMIDE 20 MG PO TABS: ORAL_TABLET | ORAL | 1 refills | Status: DC

## 2018-12-07 MED ORDER — PRAVASTATIN SODIUM 80 MG PO TABS: ORAL_TABLET | ORAL | 1 refills | Status: DC

## 2018-12-07 NOTE — Progress Notes (Signed)
   Subjective:    Patient ID: [name], female    DOB: 02/13/1927, 85 y.o.   MRN: [id number] Telephone HPI  Patient calls for a follow up on abdominal pain. Patient states the pain is much better and she is getting her appetite back and is eating again. Blood pressure under good control as best patient knows Atrial fibrillation good control best this patient knows Virtual Visit via Video Note  I connected with [name] on 12/07/18 at 10:00 AM EDT by a video enabled telemedicine application and verified that I am speaking with the correct person using two identifiers.  Location: Patient: home Provider: office   I discussed the limitations of evaluation and management by telemedicine and the availability of in person appointments. The patient expressed understanding and agreed to proceed.  History of Present Illness:    Observations/Objective:   Assessment and Plan:   Follow Up Instructions:    I discussed the assessment and treatment plan with the patient. The patient was provided an opportunity to ask questions and all were answered. The patient agreed with the plan and demonstrated an understanding of the instructions.   The patient was advised to call back or seek an in-person evaluation if the symptoms worsen or if the condition fails to improve as anticipated.  I provided 15 minutes of non-face-to-face time during this encounter.      Review of Systems  Constitutional: Negative for activity change, appetite change and fatigue.  HENT: Negative for congestion and rhinorrhea.   Respiratory: Negative for cough and shortness of breath.   Cardiovascular: Negative for chest pain and leg swelling.  Gastrointestinal: Negative for abdominal pain and diarrhea.  Endocrine: Negative for polydipsia and polyphagia.  Skin: Negative for color change.  Neurological: Negative for dizziness and weakness.  Psychiatric/Behavioral: Negative for behavioral problems and  confusion.       Objective:   Physical Exam  Today's visit was via telephone Physical exam was not possible for this visit       Assessment & Plan:  abd pain resolved  A fib  1. Essential hypertension This patient does blood pressure under good control taking medication trying to be healthy with - Hepatic function panel - CBC with Differential/Platelet  2. Atrial fibrillation, unspecified type [facility]) She states that she overall is doing well with taking her medicines denies any bleeding issues - CBC with Differential/Platelet  3. Controlled type 2 diabetes mellitus with complication, without long-term current use of insulin (HCC) As best she knows her sugars are under good control - Basic metabolic panel - Hepatic function panel - CBC with Differential/Platelet  4. Hyperlipidemia, unspecified hyperlipidemia type Patient states she does try to be healthy with her diet - Lipid panel - Hepatic function panel - CBC with Differential/Platelet  5. Chronic anticoagulation She is followed by cardiology clinic - CBC with Differential/Platelet  6. Idiopathic gout, unspecified chronicity, unspecified site Has not had any flareups recently

## 2018-12-07 NOTE — Telephone Encounter (Signed)
LMOVM for pt to return call. Need to educate patient that she does not need to send a manual transmission w/ her [place] home monitor. Monitor is automatic.

## 2018-12-11 NOTE — Telephone Encounter (Signed)
I educated the pt on when to send a manual transmission and when not to. I let her know that her monitor is designed to send a transmission automatically. The pt verbalized understanding.

## 2018-12-20 ENCOUNTER — Ambulatory Visit (INDEPENDENT_AMBULATORY_CARE_PROVIDER_SITE_OTHER): Payer: Medicare HMO | Admitting: *Deleted

## 2018-12-20 DIAGNOSIS — Z5181 Encounter for therapeutic drug level monitoring: Secondary | ICD-10-CM | POA: Diagnosis not present

## 2018-12-20 DIAGNOSIS — I495 Sick sinus syndrome: Secondary | ICD-10-CM

## 2018-12-20 DIAGNOSIS — I639 Cerebral infarction, unspecified: Secondary | ICD-10-CM

## 2018-12-20 DIAGNOSIS — I4891 Unspecified atrial fibrillation: Secondary | ICD-10-CM | POA: Diagnosis not present

## 2018-12-20 LAB — POCT INR: INR: 2 (ref 2.0–3.0)

## 2018-12-20 NOTE — Patient Instructions (Signed)
Take coumadin 1 tablet tonight then resume 1/2 tablet daily Recheck in 6 weeks

## 2018-12-22 ENCOUNTER — Other Ambulatory Visit: Payer: Self-pay | Admitting: *Deleted

## 2018-12-22 DIAGNOSIS — E041 Nontoxic single thyroid nodule: Secondary | ICD-10-CM

## 2018-12-29 ENCOUNTER — Other Ambulatory Visit: Payer: Self-pay

## 2018-12-29 ENCOUNTER — Ambulatory Visit (HOSPITAL_COMMUNITY)
Admission: RE | Admit: 2018-12-29 | Discharge: 2018-12-29 | Disposition: A | Discharge status: Home / Self Care | Payer: Medicare HMO | Source: Ambulatory Visit | Attending: Family Medicine | Admitting: Family Medicine

## 2018-12-29 DIAGNOSIS — E042 Nontoxic multinodular goiter: Secondary | ICD-10-CM | POA: Diagnosis not present

## 2018-12-29 DIAGNOSIS — E041 Nontoxic single thyroid nodule: Secondary | ICD-10-CM | POA: Diagnosis not present

## 2018-12-30 ENCOUNTER — Encounter: Payer: Self-pay | Admitting: Family Medicine

## 2018-12-30 DIAGNOSIS — E041 Nontoxic single thyroid nodule: Secondary | ICD-10-CM

## 2018-12-30 HISTORY — DX: Nontoxic single thyroid nodule: E04.1

## 2019-01-02 ENCOUNTER — Other Ambulatory Visit: Payer: Self-pay

## 2019-01-02 ENCOUNTER — Ambulatory Visit (INDEPENDENT_AMBULATORY_CARE_PROVIDER_SITE_OTHER): Payer: Medicare HMO | Admitting: Family Medicine

## 2019-01-02 DIAGNOSIS — R42 Dizziness and giddiness: Secondary | ICD-10-CM

## 2019-01-02 DIAGNOSIS — I10 Essential (primary) hypertension: Secondary | ICD-10-CM

## 2019-01-02 NOTE — Progress Notes (Signed)
   Subjective:    Patient ID: [name], female    DOB: 1926/07/26, 85 y.o.   MRN: [id number]  HPI Pt has had some dizziness this week when rolling over in bed. Also when pt stands up to fast, she states she gets swimmy headed. Pt states that she has taken Meclizine in the past and that stops the dizziness. Pt states when she had her ultrasound, she was laying on her back and when she went to turn over, she couldn't see anything. Patient had a recent ultrasound which shows a thyroid cyst not a growth no need for any type of biopsy or removal Virtual Visit via Video Note  I connected with ERRICA DUTIL on 01/02/19 at 11:00 AM EDT by a video enabled telemedicine application and verified that I am speaking with the correct person using two identifiers.  Location: Patient: home Provider: office   I discussed the limitations of evaluation and management by telemedicine and the availability of in person appointments. The patient expressed understanding and agreed to proceed.  History of Present Illness:    Observations/Objective:   Assessment and Plan:   Follow Up Instructions:    I discussed the assessment and treatment plan with the patient. The patient was provided an opportunity to ask questions and all were answered. The patient agreed with the plan and demonstrated an understanding of the instructions.   The patient was advised to call back or seek an in-person evaluation if the symptoms worsen or if the condition fails to improve as anticipated.  I provided 15 minutes of non-face-to-face time during this encounter.   Vicente Males, LPN  15 minutes was spent with patient today discussing healthcare issues which they came.  More than 50% of this visit-total duration of visit-was spent in counseling and coordination of care.  Please see diagnosis regarding the focus of this coordination and care   Review of Systems  Constitutional: Negative for activity change,  fatigue and fever.  HENT: Negative for congestion and rhinorrhea.   Respiratory: Negative for cough, chest tightness and shortness of breath.   Cardiovascular: Negative for chest pain and leg swelling.  Gastrointestinal: Negative for abdominal pain and nausea.  Skin: Negative for color change.  Neurological: Negative for dizziness and headaches.  Psychiatric/Behavioral: Negative for agitation and behavioral problems.       Objective:   Physical Exam  Patient had virtual visit Appears to be in no distress Atraumatic Neuro able to relate and oriented No apparent resp distress Color normal       Assessment & Plan:  I suspect some of the dizziness is inner ear I cannot rule out the possibility of blood pressure related issues Review family member is a nurse who will check her blood pressure sitting and standing and notify us of the results As for the thyroid no need for any type of biopsy or removal  Follow-up here if any ongoing issues and for regular visits

## 2019-01-03 ENCOUNTER — Telehealth: Payer: Self-pay | Admitting: Family Medicine

## 2019-01-03 NOTE — Telephone Encounter (Signed)
Pt had virtual visit yesterday and was told to call back with BP readings for sitting and standing.   Sitting 150/60  Standing 158/62

## 2019-01-05 NOTE — Telephone Encounter (Signed)
Discussed with pt. Pt verbalized understanding.  °

## 2019-01-05 NOTE — Telephone Encounter (Signed)
Those numbers are not terrible for her age  I recommend that she periodically get her blood pressure checked over the next few weeks then send [place] or call us with those results

## 2019-01-10 ENCOUNTER — Other Ambulatory Visit: Payer: Self-pay | Admitting: Internal Medicine

## 2019-01-16 ENCOUNTER — Ambulatory Visit (INDEPENDENT_AMBULATORY_CARE_PROVIDER_SITE_OTHER): Payer: Medicare HMO | Admitting: *Deleted

## 2019-01-16 DIAGNOSIS — I495 Sick sinus syndrome: Secondary | ICD-10-CM | POA: Diagnosis not present

## 2019-01-16 DIAGNOSIS — I639 Cerebral infarction, unspecified: Secondary | ICD-10-CM

## 2019-01-16 LAB — CUP PACEART REMOTE DEVICE CHECK
Battery Remaining Longevity: 22 mo
Battery Remaining Percentage: 17 %
Battery Voltage: 2.75 V
Brady Statistic RV Percent Paced: 62 %
Date Time Interrogation Session: 20200810062024
Implantable Lead Implant Date: 20100602
Implantable Lead Implant Date: 20100602
Implantable Lead Location: 753859
Implantable Lead Location: 753860
Implantable Pulse Generator Implant Date: 20100602
Lead Channel Impedance Value: 400 Ohm
Lead Channel Pacing Threshold Amplitude: 1 V
Lead Channel Pacing Threshold Pulse Width: 0.4 ms
Lead Channel Sensing Intrinsic Amplitude: 12 mV
Lead Channel Setting Pacing Amplitude: 2.5 V
Lead Channel Setting Pacing Pulse Width: 0.4 ms
Lead Channel Setting Sensing Sensitivity: 2 mV
Pulse Gen Model: 2210
Pulse Gen Serial Number: 2323973

## 2019-01-22 ENCOUNTER — Other Ambulatory Visit: Payer: Self-pay

## 2019-01-22 ENCOUNTER — Ambulatory Visit (INDEPENDENT_AMBULATORY_CARE_PROVIDER_SITE_OTHER): Payer: Medicare HMO | Admitting: Pharmacist Clinician (PhC)/ Clinical Pharmacy Specialist

## 2019-01-22 DIAGNOSIS — Z5181 Encounter for therapeutic drug level monitoring: Secondary | ICD-10-CM

## 2019-01-22 DIAGNOSIS — I495 Sick sinus syndrome: Secondary | ICD-10-CM

## 2019-01-22 DIAGNOSIS — I639 Cerebral infarction, unspecified: Secondary | ICD-10-CM | POA: Diagnosis not present

## 2019-01-22 DIAGNOSIS — I4891 Unspecified atrial fibrillation: Secondary | ICD-10-CM

## 2019-01-22 LAB — POCT INR: INR: 1.7 — AB (ref 2.0–3.0)

## 2019-01-22 NOTE — Patient Instructions (Signed)
Take coumadin 1 tablet tonight then resume 1/2 tablet daily Recheck in 4 weeks

## 2019-01-25 NOTE — Progress Notes (Signed)
Remote pacemaker transmission.   

## 2019-02-02 DIAGNOSIS — B351 Tinea unguium: Secondary | ICD-10-CM | POA: Diagnosis not present

## 2019-02-02 DIAGNOSIS — L84 Corns and callosities: Secondary | ICD-10-CM | POA: Diagnosis not present

## 2019-02-02 DIAGNOSIS — I70293 Other atherosclerosis of native arteries of extremities, bilateral legs: Secondary | ICD-10-CM | POA: Diagnosis not present

## 2019-02-19 ENCOUNTER — Ambulatory Visit (INDEPENDENT_AMBULATORY_CARE_PROVIDER_SITE_OTHER): Payer: Medicare HMO | Admitting: *Deleted

## 2019-02-19 ENCOUNTER — Other Ambulatory Visit: Payer: Self-pay

## 2019-02-19 DIAGNOSIS — Z5181 Encounter for therapeutic drug level monitoring: Secondary | ICD-10-CM

## 2019-02-19 DIAGNOSIS — I639 Cerebral infarction, unspecified: Secondary | ICD-10-CM | POA: Diagnosis not present

## 2019-02-19 DIAGNOSIS — I495 Sick sinus syndrome: Secondary | ICD-10-CM

## 2019-02-19 LAB — POCT INR: INR: 1.7 — AB (ref 2.0–3.0)

## 2019-02-19 NOTE — Patient Instructions (Signed)
Increase coumadin to 1/2 tablet daily except 1 tablet on Mondays Recheck in 4 weeks

## 2019-03-19 ENCOUNTER — Ambulatory Visit (INDEPENDENT_AMBULATORY_CARE_PROVIDER_SITE_OTHER): Payer: Medicare HMO | Admitting: *Deleted

## 2019-03-19 ENCOUNTER — Other Ambulatory Visit: Payer: Self-pay

## 2019-03-19 DIAGNOSIS — I495 Sick sinus syndrome: Secondary | ICD-10-CM

## 2019-03-19 DIAGNOSIS — Z5181 Encounter for therapeutic drug level monitoring: Secondary | ICD-10-CM

## 2019-03-19 DIAGNOSIS — I639 Cerebral infarction, unspecified: Secondary | ICD-10-CM | POA: Diagnosis not present

## 2019-03-19 DIAGNOSIS — I4891 Unspecified atrial fibrillation: Secondary | ICD-10-CM | POA: Diagnosis not present

## 2019-03-19 LAB — POCT INR: INR: 2 (ref 2.0–3.0)

## 2019-03-19 NOTE — Patient Instructions (Signed)
Continue coumadin 1/2 tablet daily except 1 tablet on Mondays Recheck in 4 weeks

## 2019-03-21 ENCOUNTER — Other Ambulatory Visit: Payer: Self-pay | Admitting: Family Medicine

## 2019-03-26 ENCOUNTER — Telehealth: Payer: Self-pay | Admitting: Family Medicine

## 2019-03-26 NOTE — Telephone Encounter (Signed)
Called to get more information and [name] did not know the name of the med. States dr Nicki Reaper gave it to her awhile back. Looked through notes could not find anything on neck pain. She was prescribed hydrocodone back in may #10. Ask [name] if that was the med and she said she didn't know the name. Just a pain pill for her neck.

## 2019-03-26 NOTE — Telephone Encounter (Signed)
[name] called and said that her mama was having trouble with her neck and needed some more of that "neck medicine" but did not know the name of it just said Dr. Nicki Reaper would know the name of her neck medicine.   [place]

## 2019-03-27 NOTE — Telephone Encounter (Signed)
I do not recommend anti-inflammatories because of increased risk of developing ulcers  Previously I tried tramadol but that caused drowsiness The safest thing to do is warm compresses to the neck as well as Tylenol as needed  Follow-up if ongoing troubles

## 2019-03-27 NOTE — Telephone Encounter (Signed)
Pt called back after talking with ava and Pt states she has tried warm compress and tylenol  For one week now. She wants to get some hydrocodone if possible. [facility]

## 2019-03-27 NOTE — Telephone Encounter (Signed)
Discussed with ava and she said to ask you if she could have more of the hydrocodone for her neck.

## 2019-03-28 ENCOUNTER — Telehealth: Payer: Self-pay | Admitting: Family Medicine

## 2019-03-28 NOTE — Telephone Encounter (Signed)
See other note from today

## 2019-03-28 NOTE — Telephone Encounter (Signed)
Left message to return call 

## 2019-03-28 NOTE — Telephone Encounter (Signed)
Discussed with [name] and [name] states she will talk with [name] and call us back.

## 2019-03-28 NOTE — Telephone Encounter (Signed)
Pt states she has been leaving messages and noone has called back about the request for hydrocodone

## 2019-03-28 NOTE — Telephone Encounter (Signed)
Tried to call daughter ava who is on release of information no answer.  Could not find in chart who was her medical power of attorney.

## 2019-03-28 NOTE — Telephone Encounter (Signed)
I will send in short script for hydrocodone later this evening Can pick up in the morning at her pharmacy  I highly recommend first time med used be 1/2 tablet and IF IT causes drowsiness stop the med For infrequent use

## 2019-03-28 NOTE — Telephone Encounter (Signed)
Discussed with pt's daughter [name] and with the pt. They both verbalized understanding and please send to [facility].

## 2019-03-28 NOTE — Telephone Encounter (Signed)
[name] called back and states she cannot get in touch with phyllis. I did not call phyllis because she is not on pt's release of information only ava and valerie are. [name] said she talked with valerie since she could not get in touch with phyllis and they both agreed that their mom is in her right mind and she needs something for pain. I asked ava if anyone in the family had medical power of attorney since me nor libby could find anything in her chart and ava said no that no one does.

## 2019-03-28 NOTE — Telephone Encounter (Signed)
Patient calling back mad because she still doesn't have her medicine.

## 2019-03-28 NOTE — Telephone Encounter (Signed)
Nurses  Although hydrocodone can help with the discomfort it can cause drowsiness Previously when we use tramadol which is weaker than hydrocodone it to cause drowsiness At that time Elige Radon who is the daughter of Yamaris stated that she did not want her mother on any type of narcotic medication-which is hydrocodone So therefore there is conflicting information  Please touch base with the family Who has the medical power of attorney to make that decision? I do not want to be put into a position where I prescribed 1 medicine that the family is requesting yet have another part of the family be upset about prescribing something they did not want their mother to have

## 2019-03-29 ENCOUNTER — Other Ambulatory Visit: Payer: Self-pay | Admitting: Family Medicine

## 2019-03-29 MED ORDER — HYDROCODONE-ACETAMINOPHEN 5-325 MG PO TABS: ORAL_TABLET | ORAL | 0 refills | Status: DC

## 2019-03-29 NOTE — Telephone Encounter (Signed)
Prescription was sent in as requested

## 2019-04-04 ENCOUNTER — Other Ambulatory Visit: Payer: Self-pay | Admitting: Cardiology

## 2019-04-13 ENCOUNTER — Other Ambulatory Visit: Payer: Self-pay | Admitting: Family Medicine

## 2019-04-16 ENCOUNTER — Other Ambulatory Visit: Payer: Self-pay

## 2019-04-16 ENCOUNTER — Ambulatory Visit (INDEPENDENT_AMBULATORY_CARE_PROVIDER_SITE_OTHER): Payer: Medicare HMO | Admitting: *Deleted

## 2019-04-16 DIAGNOSIS — Z5181 Encounter for therapeutic drug level monitoring: Secondary | ICD-10-CM | POA: Diagnosis not present

## 2019-04-16 DIAGNOSIS — I639 Cerebral infarction, unspecified: Secondary | ICD-10-CM

## 2019-04-16 DIAGNOSIS — I495 Sick sinus syndrome: Secondary | ICD-10-CM | POA: Diagnosis not present

## 2019-04-16 LAB — POCT INR: INR: 2.6 (ref 2.0–3.0)

## 2019-04-16 NOTE — Patient Instructions (Signed)
Continue coumadin 1/2 tablet daily except 1 tablet on Mondays Recheck in 6 weeks 

## 2019-04-18 ENCOUNTER — Ambulatory Visit (INDEPENDENT_AMBULATORY_CARE_PROVIDER_SITE_OTHER): Payer: Medicare HMO | Admitting: *Deleted

## 2019-04-18 DIAGNOSIS — I4891 Unspecified atrial fibrillation: Secondary | ICD-10-CM

## 2019-04-18 DIAGNOSIS — I495 Sick sinus syndrome: Secondary | ICD-10-CM

## 2019-04-18 LAB — CUP PACEART REMOTE DEVICE CHECK
Battery Remaining Longevity: 13 mo
Battery Remaining Percentage: 10 %
Battery Voltage: 2.71 V
Brady Statistic RV Percent Paced: 62 %
Date Time Interrogation Session: 20201109085129
Implantable Lead Implant Date: 20100602
Implantable Lead Implant Date: 20100602
Implantable Lead Location: 753859
Implantable Lead Location: 753860
Implantable Pulse Generator Implant Date: 20100602
Lead Channel Impedance Value: 400 Ohm
Lead Channel Pacing Threshold Amplitude: 1 V
Lead Channel Pacing Threshold Pulse Width: 0.4 ms
Lead Channel Sensing Intrinsic Amplitude: 12 mV
Lead Channel Setting Pacing Amplitude: 2.5 V
Lead Channel Setting Pacing Pulse Width: 0.4 ms
Lead Channel Setting Sensing Sensitivity: 2 mV
Pulse Gen Model: 2210
Pulse Gen Serial Number: 2323973

## 2019-04-27 DIAGNOSIS — R69 Illness, unspecified: Secondary | ICD-10-CM | POA: Diagnosis not present

## 2019-05-09 NOTE — Progress Notes (Signed)
Remote pacemaker transmission.   

## 2019-05-22 ENCOUNTER — Other Ambulatory Visit: Payer: Self-pay | Admitting: Family Medicine

## 2019-05-22 NOTE — Telephone Encounter (Signed)
Last seen for bp 01/02/19

## 2019-05-29 ENCOUNTER — Ambulatory Visit (INDEPENDENT_AMBULATORY_CARE_PROVIDER_SITE_OTHER): Payer: Medicare HMO | Admitting: *Deleted

## 2019-05-29 ENCOUNTER — Other Ambulatory Visit: Payer: Self-pay

## 2019-05-29 DIAGNOSIS — I4891 Unspecified atrial fibrillation: Secondary | ICD-10-CM | POA: Diagnosis not present

## 2019-05-29 DIAGNOSIS — I639 Cerebral infarction, unspecified: Secondary | ICD-10-CM | POA: Diagnosis not present

## 2019-05-29 DIAGNOSIS — Z5181 Encounter for therapeutic drug level monitoring: Secondary | ICD-10-CM

## 2019-05-29 LAB — POCT INR: INR: 2.2 (ref 2.0–3.0)

## 2019-05-29 NOTE — Patient Instructions (Signed)
Continue coumadin 1/2 tablet daily except 1 tablet on Mondays Recheck in 6 weeks 

## 2019-06-14 ENCOUNTER — Encounter (INDEPENDENT_AMBULATORY_CARE_PROVIDER_SITE_OTHER): Payer: Medicare HMO | Admitting: Ophthalmology

## 2019-06-14 ENCOUNTER — Other Ambulatory Visit: Payer: Self-pay

## 2019-06-14 DIAGNOSIS — H2512 Age-related nuclear cataract, left eye: Secondary | ICD-10-CM | POA: Diagnosis not present

## 2019-06-14 DIAGNOSIS — H3509 Other intraretinal microvascular abnormalities: Secondary | ICD-10-CM

## 2019-06-14 DIAGNOSIS — H3412 Central retinal artery occlusion, left eye: Secondary | ICD-10-CM | POA: Diagnosis not present

## 2019-06-14 DIAGNOSIS — H35033 Hypertensive retinopathy, bilateral: Secondary | ICD-10-CM

## 2019-06-14 DIAGNOSIS — I10 Essential (primary) hypertension: Secondary | ICD-10-CM | POA: Diagnosis not present

## 2019-06-20 ENCOUNTER — Telehealth: Payer: Self-pay

## 2019-06-20 NOTE — Telephone Encounter (Signed)
Attempted to call patient to assess her condition. Transmission from 06/19/19 showed device function WNL and no new alerts. Will attempt to reach patient at later time.

## 2019-06-20 NOTE — Telephone Encounter (Signed)
The pt states she has been experiencing some SOB for 3 days now. I told her I will have the nurse take a look at the transmissions and give her a call back.

## 2019-06-22 NOTE — Telephone Encounter (Signed)
Spoke with patient. She reports that she had a bout of bronchitis but that symptoms have since resolved. No SOB or other symptoms at this time. Requested that pt call our office if she is having symptoms and sending transmission so that we can review it and discuss results. Pt verbalizes understanding and denies questions at this time.

## 2019-07-05 ENCOUNTER — Other Ambulatory Visit: Payer: Self-pay | Admitting: Family Medicine

## 2019-07-05 ENCOUNTER — Other Ambulatory Visit: Payer: Self-pay | Admitting: Internal Medicine

## 2019-07-05 NOTE — Telephone Encounter (Signed)
Went back to 2018 and could not find a med check up

## 2019-07-10 ENCOUNTER — Encounter (INDEPENDENT_AMBULATORY_CARE_PROVIDER_SITE_OTHER): Payer: Self-pay

## 2019-07-10 ENCOUNTER — Ambulatory Visit (INDEPENDENT_AMBULATORY_CARE_PROVIDER_SITE_OTHER): Payer: Medicare HMO | Admitting: *Deleted

## 2019-07-10 ENCOUNTER — Other Ambulatory Visit: Payer: Self-pay

## 2019-07-10 DIAGNOSIS — I639 Cerebral infarction, unspecified: Secondary | ICD-10-CM | POA: Diagnosis not present

## 2019-07-10 DIAGNOSIS — Z5181 Encounter for therapeutic drug level monitoring: Secondary | ICD-10-CM

## 2019-07-10 DIAGNOSIS — I4891 Unspecified atrial fibrillation: Secondary | ICD-10-CM

## 2019-07-10 LAB — POCT INR: INR: 2.3 (ref 2.0–3.0)

## 2019-07-10 NOTE — Patient Instructions (Signed)
Continue coumadin 1/2 tablet daily except 1 tablet on Mondays Recheck in 6 weeks 

## 2019-07-16 ENCOUNTER — Ambulatory Visit (INDEPENDENT_AMBULATORY_CARE_PROVIDER_SITE_OTHER): Payer: Medicare HMO | Admitting: Family Medicine

## 2019-07-16 DIAGNOSIS — M1000 Idiopathic gout, unspecified site: Secondary | ICD-10-CM | POA: Diagnosis not present

## 2019-07-16 MED ORDER — PREDNISONE 20 MG PO TABS: ORAL_TABLET | ORAL | 0 refills | Status: DC

## 2019-07-16 NOTE — Progress Notes (Signed)
   Subjective:    Patient ID: [name], female    DOB: 1927/05/18, 86 y.o.   MRN: [id number]  HPIswelling in both ankles and feet. Started about one week ago. Right ankle is sore near back of heel. Tried an otc cream with no relief.  Significant heel in ankle tenderness pain discomfort no known injury no sores no blisters no sign of cellulitis.  PMH does have history of gout Virtual Visit via Telephone Note  I connected with LUVERTA MCQUEEN on 07/16/19 at  2:00 PM EST by telephone and verified that I am speaking with the correct person using two identifiers.  Location: Patient: home Provider: office   I discussed the limitations, risks, security and privacy concerns of performing an evaluation and management service by telephone and the availability of in person appointments. I also discussed with the patient that there may be a patient responsible charge related to this service. The patient expressed understanding and agreed to proceed.   History of Present Illness:    Observations/Objective:   Assessment and Plan:   Follow Up Instructions:    I discussed the assessment and treatment plan with the patient. The patient was provided an opportunity to ask questions and all were answered. The patient agreed with the plan and demonstrated an understanding of the instructions.   The patient was advised to call back or seek an in-person evaluation if the symptoms worsen or if the condition fails to improve as anticipated.  I provided 15 minutes of non-face-to-face time during this encounter.        Review of Systems  Constitutional: Negative for activity change and appetite change.  HENT: Negative for congestion and rhinorrhea.   Respiratory: Negative for cough and shortness of breath.   Cardiovascular: Negative for chest pain and leg swelling.  Gastrointestinal: Negative for abdominal pain, nausea and vomiting.  Musculoskeletal: Positive for arthralgias. Negative  for back pain.  Skin: Negative for color change.  Neurological: Negative for dizziness and weakness.  Psychiatric/Behavioral: Negative for agitation and confusion.   Patient relates trace edema in the right ankle but not in the other ankle    Objective:   Physical Exam  Today's visit was via telephone Physical exam was not possible for this visit       Assessment & Plan:  Possible gout recommend prednisone short course for 5 days if ongoing troubles neck step would be is to come to the office for further evaluation and examination patient understands this may use Tylenol as needed

## 2019-07-18 ENCOUNTER — Ambulatory Visit (INDEPENDENT_AMBULATORY_CARE_PROVIDER_SITE_OTHER): Payer: Medicare HMO | Admitting: *Deleted

## 2019-07-18 DIAGNOSIS — I4891 Unspecified atrial fibrillation: Secondary | ICD-10-CM | POA: Diagnosis not present

## 2019-07-18 LAB — CUP PACEART REMOTE DEVICE CHECK
Battery Remaining Longevity: 9 mo
Battery Remaining Percentage: 6 %
Battery Voltage: 2.68 V
Brady Statistic RV Percent Paced: 62 %
Date Time Interrogation Session: 20210210022331
Implantable Lead Implant Date: 20100602
Implantable Lead Implant Date: 20100602
Implantable Lead Location: 753859
Implantable Lead Location: 753860
Implantable Pulse Generator Implant Date: 20100602
Lead Channel Impedance Value: 410 Ohm
Lead Channel Pacing Threshold Amplitude: 1 V
Lead Channel Pacing Threshold Pulse Width: 0.4 ms
Lead Channel Sensing Intrinsic Amplitude: 11.4 mV
Lead Channel Setting Pacing Amplitude: 2.5 V
Lead Channel Setting Pacing Pulse Width: 0.4 ms
Lead Channel Setting Sensing Sensitivity: 2 mV
Pulse Gen Model: 2210
Pulse Gen Serial Number: 2323973

## 2019-07-18 NOTE — Progress Notes (Signed)
PPM Remote  

## 2019-08-08 ENCOUNTER — Other Ambulatory Visit: Payer: Self-pay

## 2019-08-08 ENCOUNTER — Ambulatory Visit (INDEPENDENT_AMBULATORY_CARE_PROVIDER_SITE_OTHER): Payer: Medicare HMO | Admitting: Family Medicine

## 2019-08-08 ENCOUNTER — Encounter: Payer: Self-pay | Admitting: Family Medicine

## 2019-08-08 VITALS — BP 158/74 | Temp 97.2°F | Ht 62.0 in

## 2019-08-08 DIAGNOSIS — R0602 Shortness of breath: Secondary | ICD-10-CM

## 2019-08-08 DIAGNOSIS — M2550 Pain in unspecified joint: Secondary | ICD-10-CM | POA: Diagnosis not present

## 2019-08-08 DIAGNOSIS — R609 Edema, unspecified: Secondary | ICD-10-CM

## 2019-08-08 DIAGNOSIS — E785 Hyperlipidemia, unspecified: Secondary | ICD-10-CM

## 2019-08-08 DIAGNOSIS — R5383 Other fatigue: Secondary | ICD-10-CM | POA: Diagnosis not present

## 2019-08-08 DIAGNOSIS — I38 Endocarditis, valve unspecified: Secondary | ICD-10-CM

## 2019-08-08 MED ORDER — FUROSEMIDE 20 MG PO TABS: ORAL_TABLET | ORAL | 0 refills | Status: DC

## 2019-08-08 NOTE — Progress Notes (Signed)
   Subjective:    Patient ID: KINZI DRUM, female    DOB: September 04, 1926, 86 y.o.   MRN: [id number]  HPIbilateral foot swelling and sob off and on for 2 weeks.   Patient notes she has had some ankle pain.  Right more than left.  Also notes swelling in both legs.  Some shortness of breath with exertion.  No orthopnea.  No chest pain.  Does have a history of mitral valvular disease.  Last echocardiogram 2 years ago.  Also has history of atrial fibrillation.  Followed by electrophysiologist Dr. Lovena Le and home monitoring.  No history of arthritis work-up per patient  Review of Systems No headache no chest pain no obvious shortness of breath    Objective:   Physical Exam  Alert active hydration good blood pressure good on repeat lungs no obvious crackles no tachypnea heart rhythm irregular but in good control.  Positive distinct flow murmur ankles 1-2+ edema right ankle some pain with flexion  Both hands reveals some swelling at the metacarpal phalangeal joint both hands      Assessment & Plan:  Impression progressive edema both legs accompanied by painful ankles.  History of gout.  We will check some blood work to assess.  No history of congestive failure but patient is on Lasix.  Increase Lasix to 1-1/2 tablets daily.  Appropriate blood work.  Referral back to general cardiologist for evaluation of heart and valves.  Likely will need another echocardiogram.  Warning signs discussed.  Further recommendations based on blood work  Greater than 50% of this 30 minute face to face visit was spent in counseling and discussion and coordination of care regarding the above diagnosis/diagnosies

## 2019-08-09 ENCOUNTER — Telehealth: Payer: Self-pay | Admitting: Family Medicine

## 2019-08-09 ENCOUNTER — Encounter: Payer: Self-pay | Admitting: Family Medicine

## 2019-08-09 LAB — HEPATIC FUNCTION PANEL
ALT: 11 IU/L (ref 0–32)
AST: 20 IU/L (ref 0–40)
Albumin: 4.4 g/dL (ref 3.5–4.6)
Alkaline Phosphatase: 95 IU/L (ref 39–117)
Bilirubin Total: 0.3 mg/dL (ref 0.0–1.2)
Bilirubin, Direct: 0.13 mg/dL (ref 0.00–0.40)
Total Protein: 6.8 g/dL (ref 6.0–8.5)

## 2019-08-09 LAB — BASIC METABOLIC PANEL
BUN/Creatinine Ratio: 10 — ABNORMAL LOW (ref 12–28)
BUN: 27 mg/dL (ref 10–36)
CO2: 22 mmol/L (ref 20–29)
Calcium: 9.8 mg/dL (ref 8.7–10.3)
Chloride: 102 mmol/L (ref 96–106)
Creatinine, Ser: 2.72 mg/dL — ABNORMAL HIGH (ref 0.57–1.00)
GFR calc Af Amer: 17 mL/min/{1.73_m2} — ABNORMAL LOW (ref >59)
GFR calc non Af Amer: 15 mL/min/{1.73_m2} — ABNORMAL LOW (ref >59)
Glucose: 100 mg/dL — ABNORMAL HIGH (ref 65–99)
Potassium: 4.8 mmol/L (ref 3.5–5.2)
Sodium: 140 mmol/L (ref 134–144)

## 2019-08-09 LAB — LIPID PANEL
Chol/HDL Ratio: 1.9 ratio (ref 0.0–4.4)
Cholesterol, Total: 133 mg/dL (ref 100–199)
HDL: 71 mg/dL (ref >39)
LDL Chol Calc (NIH): 45 mg/dL (ref 0–99)
Triglycerides: 90 mg/dL (ref 0–149)
VLDL Cholesterol Cal: 17 mg/dL (ref 5–40)

## 2019-08-09 LAB — CBC WITH DIFFERENTIAL/PLATELET
Basophils Absolute: 0 10*3/uL (ref 0.0–0.2)
Basos: 0 %
EOS (ABSOLUTE): 0.1 10*3/uL (ref 0.0–0.4)
Eos: 1 %
Hematocrit: 32.6 % — ABNORMAL LOW (ref 34.0–46.6)
Hemoglobin: 10.7 g/dL — ABNORMAL LOW (ref 11.1–15.9)
Immature Grans (Abs): 0 10*3/uL (ref 0.0–0.1)
Immature Granulocytes: 0 %
Lymphocytes Absolute: 1.5 10*3/uL (ref 0.7–3.1)
Lymphs: 23 %
MCH: 28.1 pg (ref 26.6–33.0)
MCHC: 32.8 g/dL (ref 31.5–35.7)
MCV: 86 fL (ref 79–97)
Monocytes Absolute: 0.6 10*3/uL (ref 0.1–0.9)
Monocytes: 9 %
Neutrophils Absolute: 4.4 10*3/uL (ref 1.4–7.0)
Neutrophils: 67 %
Platelets: 233 10*3/uL (ref 150–450)
RBC: 3.81 x10E6/uL (ref 3.77–5.28)
RDW: 13.5 % (ref 11.7–15.4)
WBC: 6.7 10*3/uL (ref 3.4–10.8)

## 2019-08-09 LAB — RHEUMATOID FACTOR: Rheumatoid fact SerPl-aCnc: 11.1 IU/mL (ref 0.0–13.9)

## 2019-08-09 LAB — TSH: TSH: 2.29 u[IU]/mL (ref 0.450–4.500)

## 2019-08-09 LAB — BRAIN NATRIURETIC PEPTIDE: BNP: 422.6 pg/mL — ABNORMAL HIGH (ref 0.0–100.0)

## 2019-08-09 LAB — SEDIMENTATION RATE: Sed Rate: 43 mm/hr — ABNORMAL HIGH (ref 0–40)

## 2019-08-09 NOTE — Telephone Encounter (Signed)
So autumn this is the kind of message in the future we will give nurses full permission to run with and take care of and not make the provider have to manage. Pt seen yest.  Card consult done. Appt with card later in mo. Part of b w still pending when I check an hour ago

## 2019-08-09 NOTE — Telephone Encounter (Signed)
Patient has appointment for INR with cardiology 3/16//21 -Patient notified that referral was placed in [place] and she would be notified of appt with cardiologist.

## 2019-08-09 NOTE — Telephone Encounter (Signed)
Patient left a message on the billing line wanting to know if we are making her an appt for the cardiologist or does she need to call him and make her own appt?  Patient was seen yesterday.

## 2019-08-10 ENCOUNTER — Other Ambulatory Visit: Payer: Self-pay | Admitting: Family Medicine

## 2019-08-10 ENCOUNTER — Other Ambulatory Visit: Payer: Self-pay | Admitting: *Deleted

## 2019-08-10 DIAGNOSIS — Z79899 Other long term (current) drug therapy: Secondary | ICD-10-CM

## 2019-08-10 DIAGNOSIS — D649 Anemia, unspecified: Secondary | ICD-10-CM

## 2019-08-14 ENCOUNTER — Other Ambulatory Visit: Payer: Self-pay | Admitting: Family Medicine

## 2019-08-14 DIAGNOSIS — D649 Anemia, unspecified: Secondary | ICD-10-CM

## 2019-08-16 ENCOUNTER — Other Ambulatory Visit: Payer: Self-pay

## 2019-08-16 ENCOUNTER — Encounter: Payer: Self-pay | Admitting: Internal Medicine

## 2019-08-16 ENCOUNTER — Ambulatory Visit: Payer: Medicare HMO | Admitting: Internal Medicine

## 2019-08-16 VITALS — BP 200/70 | HR 82 | Temp 96.0°F | Ht 65.0 in | Wt 182.0 lb

## 2019-08-16 DIAGNOSIS — Z950 Presence of cardiac pacemaker: Secondary | ICD-10-CM

## 2019-08-16 DIAGNOSIS — I4821 Permanent atrial fibrillation: Secondary | ICD-10-CM

## 2019-08-16 LAB — CUP PACEART INCLINIC DEVICE CHECK
Battery Remaining Longevity: 6 mo
Battery Voltage: 2.66 V
Brady Statistic RA Percent Paced: 0 %
Brady Statistic RV Percent Paced: 62 %
Date Time Interrogation Session: 20210311091849
Implantable Lead Implant Date: 20100602
Implantable Lead Implant Date: 20100602
Implantable Lead Location: 753859
Implantable Lead Location: 753860
Implantable Pulse Generator Implant Date: 20100602
Lead Channel Impedance Value: 425 Ohm
Lead Channel Pacing Threshold Amplitude: 1 V
Lead Channel Pacing Threshold Pulse Width: 0.4 ms
Lead Channel Sensing Intrinsic Amplitude: 12 mV
Lead Channel Setting Pacing Amplitude: 2.5 V
Lead Channel Setting Pacing Pulse Width: 0.4 ms
Lead Channel Setting Sensing Sensitivity: 2 mV
Pulse Gen Model: 2210
Pulse Gen Serial Number: 2323973

## 2019-08-16 MED ORDER — CARVEDILOL 6.25 MG PO TABS: 6.2500 mg | ORAL_TABLET | Freq: Two times a day (BID) | ORAL | 3 refills | Status: DC

## 2019-08-16 NOTE — Progress Notes (Signed)
HPI [name] returns today for followup. She is a very pleasant 86 year old woman with a history of now chronic atrial fibrillation, symptomatic bradycardia, and remote strokes. In the interim, she has been stable. She denies peripheral edema, cough, or chest pain. No syncope. She has dyspnea with exertion. She remains active cooking and cleaning her house. She occaisionally has some congestion.  Allergies  Allergen Reactions  . Tramadol     Drowsiness     Current Outpatient Medications  Medication Sig Dispense Refill  . albuterol (VENTOLIN HFA) 108 (90 Base) MCG/ACT inhaler TAKE 2 PUFFS EVERY 6 HOURS AS NEEDED 18 g 2  . allopurinol (ZYLOPRIM) 100 MG tablet Take one tablet by mouth every day 30 tablet 11  . diltiazem (TIAZAC) 360 MG 24 hr capsule TAKE ONE CAPSULE BY MOUTH ONCE DAILY. 90 capsule 0  . furosemide (LASIX) 20 MG tablet Take one and a half tablets once daily 135 tablet 0  . lisinopril (ZESTRIL) 20 MG tablet TAKE (1) TABLET BY MOUTH ONCE DAILY. 90 tablet 0  . meclizine (ANTIVERT) 25 MG tablet TAKE (1) TABLET BY MOUTH (3) TIMES DAILY AS NEEDED. 30 tablet 0  . pravastatin (PRAVACHOL) 80 MG tablet TAKE (1) TABLET BY MOUTH AT BEDTIME. 90 tablet 0  . Travoprost, BAK Free, (TRAVATAN) 0.004 % SOLN ophthalmic solution Place 1 drop into both eyes at bedtime.     Marland Kitchen warfarin (COUMADIN) 5 MG tablet TAKE 1/2 TO 1 TABLET BY MOUTH DAILY OR AS DIRECTED. 30 tablet 6   No current facility-administered medications for this visit.     Past Medical History:  Diagnosis Date  . Anemia    H/H of 11.3/36.7 in 12/2008 ;normal MCV; normal CBC in 2011  . Angioedema 08/2006  . Atrial fibrillation (HCC)    persistant  . 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   Ultrasound 12/29/2018 demonstrates cyst no further work-up recommended by criteria  . Blood transfusion   . 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 Chronic bronchitis   . Chronic renal insufficiency    baseline creatine 1.4; 1.04 in 03/2010  . DJD (degenerative joint disease)    hands, knees  . Gout   . Hyperlipidemia   . Hypertension    heart disease diastolic dysfunction ; FW-26%; mild pulmonary edema in 2006;responded to diurectics ; LVH  . Kidney stones   . Mitral valve disease    Severe mitral annular calcification; mild to moderate stenosis-not clearly rheumatic  . Nephrolithiasis   . Pacemaker   . Retinal hemorrhage   . Seizure disorder ([place])   . Shortness of breath    only with exertion  . Stroke ([place])   . Tachycardia-bradycardia syndrome ([place])    with pauses and syncope;PPM implantation 10/2008,negative stress nuclear study 03/2005  . Vertigo     ROS:   All systems reviewed and negative except as noted in the HPI.   Past Surgical History:  Procedure Laterality Date  . CATARACT EXTRACTION W/PHACO  03/29/2011   Procedure: CATARACT EXTRACTION PHACO AND INTRAOCULAR LENS PLACEMENT (IOC);  Surgeon: Tonny Branch;  Location: [facility];  Service: Ophthalmology;  Laterality: Right;  CDE 12.62  . INSERT / REPLACE / REMOVE PACEMAKER  11/2008   St.Jude DUAL chamber pacemaker 11/06/2008  . MEMBRANE PEEL Right 02/12/2014  Procedure: MEMBRANE PEEL;  Surgeon: [name], MD;  Location: [place];  Service: Ophthalmology;  Laterality: Right;  . PARS PLANA VITRECTOMY Right 02/12/2014   Procedure: PARS PLANA VITRECTOMY WITH 25 GAUGE;  Surgeon: [name], MD;  Location: [place];  Service: Ophthalmology;  Laterality: Right;  . TOTAL KNEE ARTHROPLASTY  04/2009   Right     Family History  Problem Relation Age of Onset  . Cancer Other        unknown cancer  . Hypotension Neg Hx   . Anesthesia problems Neg Hx   . Pseudochol deficiency Neg Hx   . Malignant hyperthermia Neg Hx      Social History    Socioeconomic History  . Marital status: Married    Spouse name: Not on file  . Number of children: Not on file  . Years of education: Not on file  . Highest education level: Not on file  Occupational History  . Not on file  Tobacco Use  . Smoking status: Never Smoker  . Smokeless tobacco: Never Used  Substance and Sexual Activity  . Alcohol use: No    Alcohol/week: 0.0 standard drinks  . Drug use: No  . Sexual activity: Not on file  Other Topics Concern  . Not on file  Social History Narrative  . Not on file   Social Determinants of Health   Financial Resource Strain:   . Difficulty of Paying Living Expenses:   Food Insecurity:   . Worried About Charity fundraiser in the Last Year:   . Arboriculturist in the Last Year:   Transportation Needs:   . Film/video editor (Medical):   Marland Kitchen Lack of Transportation (Non-Medical):   Physical Activity:   . Days of Exercise per Week:   . Minutes of Exercise per Session:   Stress:   . Feeling of Stress :   Social Connections:   . Frequency of Communication with Friends and Family:   . Frequency of Social Gatherings with Friends and Family:   . Attends Religious Services:   . Active Member of Clubs or Organizations:   . Attends Archivist Meetings:   Marland Kitchen Marital Status:   Intimate Partner Violence:   . Fear of Current or Ex-Partner:   . Emotionally Abused:   Marland Kitchen Physically Abused:   . Sexually Abused:      BP (!) 200/70 (BP Location: Left Arm, Cuff Size: Normal)   Pulse 82   Temp (!) 96 F (35.6 C)   Ht 5\' 5"  (1.651 m)   Wt 182 lb (82.6 kg)   SpO2 96%   BMI 30.29 kg/m   Physical Exam:  Well appearing NAD HEENT: Unremarkable Neck:  No JVD, no thyromegally Lymphatics:  No adenopathy Back:  No CVA tenderness Lungs:  Clear HEART:  Regular rate rhythm, no murmurs, no rubs, no clicks Abd:  soft, positive bowel sounds, no organomegally, no rebound, no guarding Ext:  2 plus pulses, no edema, no cyanosis,  no clubbing Skin:  No rashes no nodules Neuro:  CN II through XII intact, motor grossly intact  DEVICE  Normal device function.  See PaceArt for details.   Assess/Plan: 1. Uncontrolled HTN - her sbp is 200 today. She is asymptomatic. I have asked her to continue her current meds and start coreg 6.25 bid. 2. Atrial fib - her vr is mostly well controlled. She is pacing 62% of the time. 3. Diastolic heart failure - her symptoms  are class 2. She is encouraged to avoid salty foods. 4. Obesity - She is encouraged to lose weight.   Cristopher Peru, M.D.

## 2019-08-16 NOTE — Patient Instructions (Signed)
Medication Instructions:  Your physician has recommended you make the following change in your medication:  Start Coreg 6.25 mg Two Times Daily   *If you need a refill on your cardiac medications before your next appointment, please call your pharmacy*   Lab Work: NONE  If you have labs (blood work) drawn today and your tests are completely normal, you will receive your results only by: Marland Kitchen MyChart Message (if you have MyChart) OR . A paper copy in the mail If you have any lab test that is abnormal or we need to change your treatment, we will call you to review the results.   Testing/Procedures: NONE    Follow-Up: At [facility], you and your health needs are our priority.  As part of our continuing mission to provide you with exceptional heart care, we have created designated Provider Care Teams.  These Care Teams include your primary Cardiologist (physician) and Advanced Practice Providers (APPs -  Physician Assistants and Nurse Practitioners) who all work together to provide you with the care you need, when you need it.  We recommend signing up for the patient portal called "MyChart".  Sign up information is provided on this After Visit Summary.  MyChart is used to connect with patients for Virtual Visits (Telemedicine).  Patients are able to view lab/test results, encounter notes, upcoming appointments, etc.  Non-urgent messages can be sent to your provider as well.   To learn more about what you can do with MyChart, go to [email or web address].    Your next appointment:   3 month(s)  The format for your next appointment:   In Person  Provider:   Cristopher Peru, MD   Other Instructions Thank you for choosing [facility]!

## 2019-08-20 ENCOUNTER — Encounter: Payer: Self-pay | Admitting: Family Medicine

## 2019-08-21 ENCOUNTER — Ambulatory Visit (INDEPENDENT_AMBULATORY_CARE_PROVIDER_SITE_OTHER): Payer: Medicare HMO | Admitting: *Deleted

## 2019-08-21 ENCOUNTER — Other Ambulatory Visit: Payer: Self-pay

## 2019-08-21 DIAGNOSIS — Z79899 Other long term (current) drug therapy: Secondary | ICD-10-CM | POA: Diagnosis not present

## 2019-08-21 DIAGNOSIS — I639 Cerebral infarction, unspecified: Secondary | ICD-10-CM

## 2019-08-21 DIAGNOSIS — Z5181 Encounter for therapeutic drug level monitoring: Secondary | ICD-10-CM

## 2019-08-21 DIAGNOSIS — D649 Anemia, unspecified: Secondary | ICD-10-CM | POA: Diagnosis not present

## 2019-08-21 DIAGNOSIS — I4891 Unspecified atrial fibrillation: Secondary | ICD-10-CM | POA: Diagnosis not present

## 2019-08-21 LAB — POCT INR: INR: 2 (ref 2.0–3.0)

## 2019-08-21 NOTE — Patient Instructions (Signed)
Continue coumadin 1/2 tablet daily except 1 tablet on Mondays Recheck in 6 weeks

## 2019-08-22 LAB — CBC WITH DIFFERENTIAL/PLATELET
Basophils Absolute: 0 10*3/uL (ref 0.0–0.2)
Basos: 0 %
EOS (ABSOLUTE): 0 10*3/uL (ref 0.0–0.4)
Eos: 1 %
Hematocrit: 32.7 % — ABNORMAL LOW (ref 34.0–46.6)
Hemoglobin: 10.7 g/dL — ABNORMAL LOW (ref 11.1–15.9)
Immature Grans (Abs): 0 10*3/uL (ref 0.0–0.1)
Immature Granulocytes: 0 %
Lymphocytes Absolute: 1.5 10*3/uL (ref 0.7–3.1)
Lymphs: 27 %
MCH: 29 pg (ref 26.6–33.0)
MCHC: 32.7 g/dL (ref 31.5–35.7)
MCV: 89 fL (ref 79–97)
Monocytes Absolute: 0.6 10*3/uL (ref 0.1–0.9)
Monocytes: 10 %
Neutrophils Absolute: 3.4 10*3/uL (ref 1.4–7.0)
Neutrophils: 62 %
Platelets: 215 10*3/uL (ref 150–450)
RBC: 3.69 x10E6/uL — ABNORMAL LOW (ref 3.77–5.28)
RDW: 13.4 % (ref 11.7–15.4)
WBC: 5.5 10*3/uL (ref 3.4–10.8)

## 2019-08-22 LAB — BASIC METABOLIC PANEL
BUN/Creatinine Ratio: 16 (ref 12–28)
BUN: 35 mg/dL (ref 10–36)
CO2: 22 mmol/L (ref 20–29)
Calcium: 9.8 mg/dL (ref 8.7–10.3)
Chloride: 103 mmol/L (ref 96–106)
Creatinine, Ser: 2.19 mg/dL — ABNORMAL HIGH (ref 0.57–1.00)
GFR calc Af Amer: 22 mL/min/{1.73_m2} — ABNORMAL LOW (ref >59)
GFR calc non Af Amer: 19 mL/min/{1.73_m2} — ABNORMAL LOW (ref >59)
Glucose: 104 mg/dL — ABNORMAL HIGH (ref 65–99)
Potassium: 4.8 mmol/L (ref 3.5–5.2)
Sodium: 141 mmol/L (ref 134–144)

## 2019-08-22 LAB — FERRITIN: Ferritin: 115 ng/mL (ref 15–150)

## 2019-08-28 ENCOUNTER — Ambulatory Visit (INDEPENDENT_AMBULATORY_CARE_PROVIDER_SITE_OTHER): Payer: Medicare HMO | Admitting: Family Medicine

## 2019-08-28 ENCOUNTER — Telehealth: Payer: Self-pay | Admitting: *Deleted

## 2019-08-28 ENCOUNTER — Other Ambulatory Visit: Payer: Self-pay

## 2019-08-28 VITALS — BP 154/70 | Temp 97.5°F | Ht 65.0 in

## 2019-08-28 DIAGNOSIS — I509 Heart failure, unspecified: Secondary | ICD-10-CM

## 2019-08-28 NOTE — Telephone Encounter (Signed)
Echo order put in for pt. Pt can go anyday but prefers morning.

## 2019-08-28 NOTE — Progress Notes (Signed)
   Subjective:    Patient ID: [name], female    DOB: [birth year], 86 y.o.   MRN: [id number]  HPIpt states sob started about one week ago. Usually happens around 3 am and pt will get up. Uses albuterol inhaler and states it does help.   Swelling in ankles and foot. Significant swelling in the lower legs and in the feet she gets short of breath when she moves around she gets short of breath when she lays down she has to get up in the middle night to be able to breathe better she states uses albuterol a little bit and that helps some but not a lot.  She denies chest pressure tightness pain she saw Dr. Lovena Le they did not do an echo at that time she had a recent BNP which was elevated she is due for an echo.  She feels like she is not getting better.   Review of Systems  Constitutional: Negative for activity change, fatigue and fever.  HENT: Negative for congestion and rhinorrhea.   Respiratory: Positive for shortness of breath. Negative for cough and chest tightness.   Cardiovascular: Positive for leg swelling. Negative for chest pain.  Gastrointestinal: Negative for abdominal pain and nausea.  Skin: Negative for color change.  Neurological: Negative for dizziness and headaches.  Psychiatric/Behavioral: Negative for agitation and behavioral problems.       Objective:   Physical Exam Vitals reviewed.  Constitutional:      General: She is not in acute distress. HENT:     Head: Normocephalic and atraumatic.  Eyes:     General:        Right eye: No discharge.        Left eye: No discharge.  Neck:     Trachea: No tracheal deviation.  Cardiovascular:     Rate and Rhythm: Normal rate and regular rhythm.     Heart sounds: Normal heart sounds. No murmur.  Pulmonary:     Effort: Pulmonary effort is normal. No respiratory distress.     Breath sounds: Normal breath sounds.  Lymphadenopathy:     Cervical: No cervical adenopathy.  Skin:    General: Skin is warm and dry.    Neurological:     Mental Status: She is alert.     Coordination: Coordination normal.  Psychiatric:        Behavior: Behavior normal.    I reviewed over her lab work reviewed over her cardiology note and previous note for Dr. Richardson Landry Patient has significant pedal edema bilateral.  No crackles in the lungs.      Assessment & Plan:  Probable CHF with pedal edema bump up the dose of the Lasix to each morning Follow-up the patient in 2 to 3 weeks Check echo await results If patient gets worse she is to follow-up immediately Go to ER if severely worse Warning signs discussed with patient

## 2019-08-29 NOTE — Telephone Encounter (Signed)
PA# [id number], valid 08/30/2019-09/29/2019  Called & notified pt & pt's daughter appt 09/03/2019, arrive [facility] main entrance 9:15am

## 2019-09-03 ENCOUNTER — Other Ambulatory Visit: Payer: Self-pay

## 2019-09-03 ENCOUNTER — Ambulatory Visit (HOSPITAL_COMMUNITY)
Admission: RE | Admit: 2019-09-03 | Discharge: 2019-09-03 | Disposition: A | Discharge status: Home / Self Care | Payer: Medicare HMO | Source: Ambulatory Visit | Attending: Family Medicine | Admitting: Family Medicine

## 2019-09-03 DIAGNOSIS — I509 Heart failure, unspecified: Secondary | ICD-10-CM | POA: Diagnosis not present

## 2019-09-03 NOTE — Progress Notes (Signed)
*  PRELIMINARY RESULTS* Echocardiogram 2D Echocardiogram has been performed.  [name] 09/03/2019, 10:08 AM

## 2019-09-14 ENCOUNTER — Telehealth: Payer: Self-pay | Admitting: Internal Medicine

## 2019-09-14 NOTE — Telephone Encounter (Signed)
Patient's daughter requesting the patient to switch from Dr. Lovena Le to Dr. Rayann Heman.

## 2019-09-14 NOTE — Telephone Encounter (Signed)
That is fine with me. GT

## 2019-09-15 NOTE — Telephone Encounter (Signed)
On chart review, it looks like she is receiving great care with Dr Lovena Le.  I am not sure that I would have much to add.  She could follow with him and EP APPs.  Also might consider referral to hypertension clinic.  Not sure that seeing me would offer any additional benefit.

## 2019-09-17 ENCOUNTER — Ambulatory Visit (INDEPENDENT_AMBULATORY_CARE_PROVIDER_SITE_OTHER): Payer: Medicare HMO | Admitting: *Deleted

## 2019-09-17 DIAGNOSIS — I4891 Unspecified atrial fibrillation: Secondary | ICD-10-CM

## 2019-09-17 LAB — CUP PACEART REMOTE DEVICE CHECK
Battery Remaining Longevity: 4 mo
Battery Remaining Percentage: 3 %
Battery Voltage: 2.65 V
Brady Statistic RV Percent Paced: 88 %
Date Time Interrogation Session: 20210412034449
Implantable Lead Implant Date: 20100602
Implantable Lead Implant Date: 20100602
Implantable Lead Location: 753859
Implantable Lead Location: 753860
Implantable Pulse Generator Implant Date: 20100602
Lead Channel Impedance Value: 410 Ohm
Lead Channel Pacing Threshold Amplitude: 1 V
Lead Channel Pacing Threshold Pulse Width: 0.4 ms
Lead Channel Sensing Intrinsic Amplitude: 12 mV
Lead Channel Setting Pacing Amplitude: 2.5 V
Lead Channel Setting Pacing Pulse Width: 0.4 ms
Lead Channel Setting Sensing Sensitivity: 2 mV
Pulse Gen Model: 2210
Pulse Gen Serial Number: 2323973

## 2019-09-18 ENCOUNTER — Ambulatory Visit: Payer: Medicare HMO | Admitting: Family Medicine

## 2019-09-20 ENCOUNTER — Other Ambulatory Visit: Payer: Self-pay | Admitting: Family Medicine

## 2019-09-25 DIAGNOSIS — Z9889 Other specified postprocedural states: Secondary | ICD-10-CM | POA: Diagnosis not present

## 2019-09-25 DIAGNOSIS — H44512 Absolute glaucoma, left eye: Secondary | ICD-10-CM | POA: Diagnosis not present

## 2019-09-25 DIAGNOSIS — H40053 Ocular hypertension, bilateral: Secondary | ICD-10-CM | POA: Diagnosis not present

## 2019-09-25 DIAGNOSIS — Z961 Presence of intraocular lens: Secondary | ICD-10-CM | POA: Diagnosis not present

## 2019-10-01 NOTE — Telephone Encounter (Signed)
Patient made aware she is recommended to stay with Dr. Lovena Le.

## 2019-10-10 ENCOUNTER — Telehealth: Payer: Self-pay | Admitting: Internal Medicine

## 2019-10-10 ENCOUNTER — Ambulatory Visit (INDEPENDENT_AMBULATORY_CARE_PROVIDER_SITE_OTHER): Payer: Medicare HMO | Admitting: *Deleted

## 2019-10-10 ENCOUNTER — Other Ambulatory Visit: Payer: Self-pay

## 2019-10-10 DIAGNOSIS — Z5181 Encounter for therapeutic drug level monitoring: Secondary | ICD-10-CM

## 2019-10-10 DIAGNOSIS — I4891 Unspecified atrial fibrillation: Secondary | ICD-10-CM | POA: Diagnosis not present

## 2019-10-10 DIAGNOSIS — I639 Cerebral infarction, unspecified: Secondary | ICD-10-CM

## 2019-10-10 LAB — POCT INR: INR: 4.1 — AB (ref 2.0–3.0)

## 2019-10-10 NOTE — Patient Instructions (Signed)
Hold warfarin today then resume 1/2 tablet daily except 1 tablet on Mondays Eat extra greens/salad today Recheck in 3 weeks

## 2019-10-10 NOTE — Telephone Encounter (Signed)
Follow UP;   Pt daughter called and said even though Dr Rayann Heman recommended pt to stay with Dr Lovena Le. Pt still wants to see Dr Rayann Heman.

## 2019-10-17 LAB — CUP PACEART REMOTE DEVICE CHECK
Battery Remaining Longevity: 1 mo
Battery Remaining Percentage: 1 %
Battery Voltage: 2.63 V
Brady Statistic RV Percent Paced: 88 %
Date Time Interrogation Session: 20210510035342
Implantable Lead Implant Date: 20100602
Implantable Lead Implant Date: 20100602
Implantable Lead Location: 753859
Implantable Lead Location: 753860
Implantable Pulse Generator Implant Date: 20100602
Lead Channel Impedance Value: 410 Ohm
Lead Channel Pacing Threshold Amplitude: 1 V
Lead Channel Pacing Threshold Pulse Width: 0.4 ms
Lead Channel Sensing Intrinsic Amplitude: 12 mV
Lead Channel Setting Pacing Amplitude: 2.5 V
Lead Channel Setting Pacing Pulse Width: 0.4 ms
Lead Channel Setting Sensing Sensitivity: 2 mV
Pulse Gen Model: 2210
Pulse Gen Serial Number: 2323973

## 2019-10-18 ENCOUNTER — Ambulatory Visit (INDEPENDENT_AMBULATORY_CARE_PROVIDER_SITE_OTHER): Payer: Medicare HMO | Admitting: *Deleted

## 2019-10-18 DIAGNOSIS — I495 Sick sinus syndrome: Secondary | ICD-10-CM | POA: Diagnosis not present

## 2019-10-18 DIAGNOSIS — I639 Cerebral infarction, unspecified: Secondary | ICD-10-CM

## 2019-10-19 NOTE — Progress Notes (Signed)
Remote pacemaker transmission.   

## 2019-10-21 NOTE — Telephone Encounter (Signed)
As before, I am not managing hypertension and she has received great care from Dr Lovena Le.  She can be seen in HTN clinic or with EP APP  Thompson Grayer MD, [place] 10/21/2019 1:15 PM

## 2019-10-31 ENCOUNTER — Other Ambulatory Visit: Payer: Self-pay

## 2019-10-31 ENCOUNTER — Ambulatory Visit (INDEPENDENT_AMBULATORY_CARE_PROVIDER_SITE_OTHER): Payer: Medicare HMO | Admitting: *Deleted

## 2019-10-31 ENCOUNTER — Other Ambulatory Visit: Payer: Self-pay | Admitting: Family Medicine

## 2019-10-31 DIAGNOSIS — I639 Cerebral infarction, unspecified: Secondary | ICD-10-CM

## 2019-10-31 DIAGNOSIS — Z5181 Encounter for therapeutic drug level monitoring: Secondary | ICD-10-CM

## 2019-10-31 DIAGNOSIS — I4891 Unspecified atrial fibrillation: Secondary | ICD-10-CM | POA: Diagnosis not present

## 2019-10-31 LAB — POCT INR: INR: 2.6 (ref 2.0–3.0)

## 2019-10-31 NOTE — Patient Instructions (Signed)
Continue warfarin 1/2 tablet daily except 1 tablet on Mondays Eat extra greens/salad today Recheck in 4 weeks

## 2019-11-06 ENCOUNTER — Encounter: Payer: Self-pay | Admitting: Internal Medicine

## 2019-11-06 NOTE — Telephone Encounter (Signed)
Patient's daughter calling to request the patient switch from Dr. Lovena Le to Dr. Caryl Comes.

## 2019-11-06 NOTE — Telephone Encounter (Signed)
error 

## 2019-11-06 NOTE — Telephone Encounter (Signed)
Good morning, [name],  I dont know if we have met Maybe you can get a sense from the patient/family how it is that we can help, specifically it is BP or  We may be able to better serve if its clearer Thanks SK   

## 2019-11-07 NOTE — Telephone Encounter (Signed)
Follow up    Pts daughter is calling back    Claiborne Billings, can you please advise?

## 2019-11-09 ENCOUNTER — Telehealth: Payer: Self-pay

## 2019-11-09 ENCOUNTER — Telehealth: Payer: Self-pay | Admitting: Internal Medicine

## 2019-11-09 NOTE — Telephone Encounter (Signed)
error 

## 2019-11-09 NOTE — Telephone Encounter (Signed)
Returned call to pt's daughter.  Per Pt's daughter- Pt saw Dr. Wolfgang Phoenix 2 weeks after seeing Dr. Lovena Le.  Dr. Wolfgang Phoenix advised Pt she needed an echo and per daughters understanding he suggested that Dr. Lovena Le should have ordered it.  Dr. Wolfgang Phoenix also advised daughter Pt needed her pacemaker generator changed.    Per daughter states they are unhappy with Dr. Lovena Le and they want to change EP doctors and she states "that is our right".  Discussed with management.  Need to discuss further with Dr. Lovena Le.    Spoke with EC daughter again.  Reassured that Pt's battery is not yet at [facility].  Explained ERI and that we constantly are monitoring Pt's pacemaker remotely.    Daughter wanted to know if Pt needed to be seen soon (had appt with GT).  Advised she should be seen for new start carvedilol.  Made appt with AS for evaluation.  Advised would have plan for new doctor at that appt.  Daughter thanked Marine scientist for assistance.

## 2019-11-09 NOTE — Telephone Encounter (Signed)
New Message  Pt's daughter was returning Myrtie Hawk phone call. Please call back

## 2019-11-09 NOTE — Telephone Encounter (Signed)
Left message requesting  call back.

## 2019-11-10 ENCOUNTER — Other Ambulatory Visit: Payer: Self-pay | Admitting: Family Medicine

## 2019-11-12 ENCOUNTER — Telehealth: Payer: Self-pay | Admitting: Family Medicine

## 2019-11-12 MED ORDER — PRAVASTATIN SODIUM 80 MG PO TABS: ORAL_TABLET | ORAL | 1 refills | Status: DC

## 2019-11-12 NOTE — Telephone Encounter (Signed)
Pt made appt for July 19th at 9:00 am

## 2019-11-12 NOTE — Telephone Encounter (Signed)
May have this but follow-up office visit later in the summer

## 2019-11-12 NOTE — Telephone Encounter (Signed)
Pt last seen 3/23 CHF. Please advise. Thank you

## 2019-11-12 NOTE — Telephone Encounter (Signed)
May have 6 months worth

## 2019-11-12 NOTE — Telephone Encounter (Signed)
Patient  Is requesting refill completely out of pravastin 80 mg called into [facility]

## 2019-11-12 NOTE — Telephone Encounter (Signed)
Please schedule and then route back to nurses to send in refill °

## 2019-11-12 NOTE — Telephone Encounter (Signed)
Could not find recent visit for this med but did have lipid done march 2021

## 2019-11-12 NOTE — Telephone Encounter (Signed)
Refills sent to pharmacy. Left message to return call

## 2019-11-14 NOTE — Telephone Encounter (Signed)
Pt.notified

## 2019-11-19 ENCOUNTER — Other Ambulatory Visit: Payer: Self-pay | Admitting: Family Medicine

## 2019-11-19 ENCOUNTER — Ambulatory Visit (INDEPENDENT_AMBULATORY_CARE_PROVIDER_SITE_OTHER): Payer: Medicare HMO | Admitting: *Deleted

## 2019-11-19 DIAGNOSIS — I495 Sick sinus syndrome: Secondary | ICD-10-CM

## 2019-11-19 LAB — CUP PACEART REMOTE DEVICE CHECK
Battery Remaining Longevity: 1 mo
Battery Remaining Percentage: 0.5 %
Battery Voltage: 2.62 V
Brady Statistic RV Percent Paced: 88 %
Date Time Interrogation Session: 20210614020539
Implantable Lead Implant Date: 20100602
Implantable Lead Implant Date: 20100602
Implantable Lead Location: 753859
Implantable Lead Location: 753860
Implantable Pulse Generator Implant Date: 20100602
Lead Channel Impedance Value: 410 Ohm
Lead Channel Pacing Threshold Amplitude: 1 V
Lead Channel Pacing Threshold Pulse Width: 0.4 ms
Lead Channel Sensing Intrinsic Amplitude: 12 mV
Lead Channel Setting Pacing Amplitude: 2.5 V
Lead Channel Setting Pacing Pulse Width: 0.4 ms
Lead Channel Setting Sensing Sensitivity: 2 mV
Pulse Gen Model: 2210
Pulse Gen Serial Number: 2323973

## 2019-11-19 NOTE — Telephone Encounter (Signed)
Discussed case with Dr. Caryl Comes.  Dr. Caryl Comes is willing to assume care for Pt.  Will make AS aware for upcoming appt.

## 2019-11-19 NOTE — Progress Notes (Signed)
Remote pacemaker transmission.   

## 2019-11-21 DIAGNOSIS — B351 Tinea unguium: Secondary | ICD-10-CM | POA: Diagnosis not present

## 2019-11-22 ENCOUNTER — Ambulatory Visit: Payer: Medicare HMO | Admitting: Nurse Practitioner

## 2019-11-22 ENCOUNTER — Encounter: Payer: Medicare HMO | Admitting: Internal Medicine

## 2019-11-22 ENCOUNTER — Encounter: Payer: Self-pay | Admitting: Nurse Practitioner

## 2019-11-22 ENCOUNTER — Other Ambulatory Visit: Payer: Self-pay

## 2019-11-22 VITALS — BP 140/68 | HR 60 | Ht 65.0 in | Wt 182.0 lb

## 2019-11-22 DIAGNOSIS — I4821 Permanent atrial fibrillation: Secondary | ICD-10-CM | POA: Diagnosis not present

## 2019-11-22 DIAGNOSIS — I495 Sick sinus syndrome: Secondary | ICD-10-CM | POA: Diagnosis not present

## 2019-11-22 DIAGNOSIS — I10 Essential (primary) hypertension: Secondary | ICD-10-CM

## 2019-11-22 DIAGNOSIS — I5032 Chronic diastolic (congestive) heart failure: Secondary | ICD-10-CM

## 2019-11-22 LAB — CUP PACEART INCLINIC DEVICE CHECK
Battery Remaining Longevity: 1 mo
Battery Voltage: 2.62 V
Brady Statistic RA Percent Paced: 0 %
Brady Statistic RV Percent Paced: 88 %
Date Time Interrogation Session: 20210617090853
Implantable Lead Implant Date: 20100602
Implantable Lead Implant Date: 20100602
Implantable Lead Location: 753859
Implantable Lead Location: 753860
Implantable Pulse Generator Implant Date: 20100602
Lead Channel Impedance Value: 412.5 Ohm
Lead Channel Pacing Threshold Amplitude: 1 V
Lead Channel Pacing Threshold Amplitude: 1 V
Lead Channel Pacing Threshold Amplitude: 1.125 V
Lead Channel Pacing Threshold Pulse Width: 0.4 ms
Lead Channel Pacing Threshold Pulse Width: 0.4 ms
Lead Channel Pacing Threshold Pulse Width: 0.4 ms
Lead Channel Sensing Intrinsic Amplitude: 12 mV
Lead Channel Sensing Intrinsic Amplitude: 5 mV
Lead Channel Setting Pacing Amplitude: 2.5 V
Lead Channel Setting Pacing Pulse Width: 0.4 ms
Lead Channel Setting Sensing Sensitivity: 2 mV
Pulse Gen Model: 2210
Pulse Gen Serial Number: 2323973

## 2019-11-22 NOTE — Progress Notes (Signed)
Electrophysiology Office Note Date: 11/22/2019  ID:  [name], DOB May 27, 1927, MRN [id number]  PCP: Kathyrn Drown, MD Electrophysiologist: Lorenz Coaster  CC: Pacemaker follow-up  [name] is a 86 y.o. female seen today for Dr Lovena Le.  She presents today for routine electrophysiology followup.  Since last being seen in our clinic, the patient reports doing relatively well.  She is tolerating coreg without side effects.  She denies chest pain, palpitations, dyspnea, PND, orthopnea, nausea, vomiting, dizziness, syncope, edema, weight gain, or early satiety.  Device History: STJ dual chamber PPM implanted 2010 for tachy/brady   Past Medical History:  Diagnosis Date  . Anemia    H/H of 11.3/36.7 in 12/2008 ;normal MCV; normal CBC in 2011  . Angioedema 08/2006  . Atrial fibrillation (HCC)    persistant  . 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   Ultrasound 12/29/2018 demonstrates cyst no further work-up recommended by criteria  . Blood transfusion   . 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 Chronic bronchitis   . Chronic renal insufficiency    baseline creatine 1.4; 1.04 in 03/2010  . DJD (degenerative joint disease)    hands, knees  . Gout   . Hyperlipidemia   . Hypertension    heart disease diastolic dysfunction ; ST-41%; mild pulmonary edema in 2006;responded to diurectics ; LVH  . Kidney stones   . Mitral valve disease    Severe mitral annular calcification; mild to moderate stenosis-not clearly rheumatic  . Nephrolithiasis   . Pacemaker   . Retinal hemorrhage   . Seizure disorder ([place])   . Shortness of breath    only with exertion  . Stroke ([place])   . Tachycardia-bradycardia syndrome ([place])    with pauses and syncope;PPM implantation 10/2008,negative  stress nuclear study 03/2005  . Vertigo    Past Surgical History:  Procedure Laterality Date  . CATARACT EXTRACTION W/PHACO  03/29/2011   Procedure: CATARACT EXTRACTION PHACO AND INTRAOCULAR LENS PLACEMENT (IOC);  Surgeon: Tonny Branch;  [facility];  Service: Ophthalmology;  Laterality: Right;  CDE 12.62  . INSERT / REPLACE / REMOVE PACEMAKER  11/2008   St.Jude DUAL chamber pacemaker 11/06/2008  . MEMBRANE PEEL Right 02/12/2014   Procedure: MEMBRANE PEEL;  Surgeon: [name], MD;  Location: [place];  Service: Ophthalmology;  Laterality: Right;  . PARS PLANA VITRECTOMY Right 02/12/2014   Procedure: PARS PLANA VITRECTOMY WITH 25 GAUGE;  Surgeon: [name], MD;  Location: [place];  Service: Ophthalmology;  Laterality: Right;  . TOTAL KNEE ARTHROPLASTY  04/2009   Right    Current Outpatient Medications  Medication Sig Dispense Refill  . albuterol (VENTOLIN HFA) 108 (90 Base) MCG/ACT inhaler TAKE 2 PUFFS EVERY 6 HOURS AS NEEDED 18 g 2  . allopurinol (ZYLOPRIM) 100 MG tablet TAKE 1 TABLET BY MOUTH ONCE DAILY. 30 tablet 2  . carvedilol (COREG) 6.25 MG tablet Take 1 tablet (6.25 mg total) by mouth 2 (two) times daily. 180 tablet 3  . diltiazem (TIAZAC) 360 MG 24 hr capsule TAKE ONE CAPSULE BY MOUTH ONCE DAILY. 90 capsule 0  . furosemide (LASIX) 20 MG tablet Take one and a half tablets once daily 135 tablet 0  . lisinopril (ZESTRIL) 20 MG tablet TAKE (1) TABLET BY MOUTH ONCE DAILY. 90 tablet 0  .  meclizine (ANTIVERT) 25 MG tablet TAKE (1) TABLET BY MOUTH (3) TIMES DAILY AS NEEDED. 20 tablet 0  . pravastatin (PRAVACHOL) 80 MG tablet TAKE (1) TABLET BY MOUTH AT BEDTIME. 90 tablet 1  . Travoprost, BAK Free, (TRAVATAN) 0.004 % SOLN ophthalmic solution Place 1 drop into both eyes at bedtime.     Marland Kitchen warfarin (COUMADIN) 5 MG tablet TAKE 1/2 TO 1 TABLET BY MOUTH DAILY OR AS DIRECTED. 30 tablet 6   No current facility-administered medications for this visit.    Allergies:   Tramadol   Social  History: Social History   Socioeconomic History  . Marital status: Married    Spouse name: Not on file  . Number of children: Not on file  . Years of education: Not on file  . Highest education level: Not on file  Occupational History  . Not on file  Tobacco Use  . Smoking status: Never Smoker  . Smokeless tobacco: Never Used  Vaping Use  . Vaping Use: Never used  Substance and Sexual Activity  . Alcohol use: No    Alcohol/week: 0.0 standard drinks  . Drug use: No  . Sexual activity: Not on file  Other Topics Concern  . Not on file  Social History Narrative  . Not on file   Social Determinants of Health   Financial Resource Strain:   . Difficulty of Paying Living Expenses:   Food Insecurity:   . Worried About Charity fundraiser in the Last Year:   . Arboriculturist in the Last Year:   Transportation Needs:   . Film/video editor (Medical):   Marland Kitchen Lack of Transportation (Non-Medical):   Physical Activity:   . Days of Exercise per Week:   . Minutes of Exercise per Session:   Stress:   . Feeling of Stress :   Social Connections:   . Frequency of Communication with Friends and Family:   . Frequency of Social Gatherings with Friends and Family:   . Attends Religious Services:   . Active Member of Clubs or Organizations:   . Attends Archivist Meetings:   Marland Kitchen Marital Status:   Intimate Partner Violence:   . Fear of Current or Ex-Partner:   . Emotionally Abused:   Marland Kitchen Physically Abused:   . Sexually Abused:     Family History: Family History  Problem Relation Age of Onset  . Cancer Other        unknown cancer  . Hypotension Neg Hx   . Anesthesia problems Neg Hx   . Pseudochol deficiency Neg Hx   . Malignant hyperthermia Neg Hx      Review of Systems: All other systems reviewed and are otherwise negative except as noted above.   Physical Exam: VS:  BP 140/68   Pulse 60   Ht 5\' 5"  (1.651 m)   Wt 182 lb (82.6 kg)   SpO2 98%   BMI 30.29 kg/m   , BMI Body mass index is 30.29 kg/m.  GEN- The patient is elderly appearing, alert and oriented x 3 today.   HEENT: normocephalic, atraumatic; sclera clear, conjunctiva pink; hearing intact; oropharynx clear; neck supple  Lungs- Clear to ausculation bilaterally, normal work of breathing.  No wheezes, rales, rhonchi Heart- Regular rate and rhythm (paced) GI- soft, non-tender, non-distended, bowel sounds present  Extremities- no clubbing, cyanosis, or edema  MS- no significant deformity or atrophy Skin- warm and dry, no rash or lesion; PPM pocket well healed Psych- euthymic mood,  full affect Neuro- strength and sensation are intact  PPM Interrogation- reviewed in detail today,  See PACEART report  EKG:  EKG is ordered today. EKG today shows AF with V pacing  Recent Labs: 08/08/2019: ALT 11; BNP 422.6; TSH 2.290 08/21/2019: BUN 35; Creatinine, Ser 2.19; Hemoglobin 10.7; Platelets 215; Potassium 4.8; Sodium 141   Wt Readings from Last 3 Encounters:  11/22/19 182 lb (82.6 kg)  08/16/19 182 lb (82.6 kg)  10/10/18 180 lb (81.6 kg)     Other studies Reviewed: Additional studies/ records that were reviewed today include: Dr Tanna Furry office notes   Assessment and Plan:  1.  Tachy/brady syndrome Normal PPM function See Pace Art report No changes today Estimated longevity <3 months. Risks, benefits to gen change reviewed with patient today.  Ok to schedule with Dr Caryl Comes once she reaches ERI.  She will need INR closer to 2 at time of procedure.   2.  Permanent atrial fibrillation Continue [place] for CHADS2VASC of 5  3.  Hypertension Stable No change required today  4.  Diastolic heart failure Stable No change required today  She has requested to change providers to Dr Caryl Comes. Will arrange follow up with him in 3 months.     Current medicines are reviewed at length with the patient today.   The patient does not have concerns regarding her medicines.  The following changes were made  today:  none  Labs/ tests ordered today include:  Orders Placed This Encounter  Procedures  . EKG 12-Lead     Disposition:   Follow up with Dr Caryl Comes in 3 months, [place]     Signed, Chanetta Marshall, NP 11/22/2019 9:06 AM  [facility] [address] [place] [address] [phone] (office) [phone] (fax)

## 2019-11-22 NOTE — Patient Instructions (Addendum)
Medication Instructions:  *If you need a refill on your cardiac medications before your next appointment, please call your pharmacy*  Lab Work: If you have labs (blood work) drawn today and your tests are completely normal, you will receive your results only by: Marland Kitchen MyChart Message (if you have MyChart) OR . A paper copy in the mail If you have any lab test that is abnormal or we need to change your treatment, we will call you to review the results.  Testing/Procedures: NONE  Follow-Up: At [facility], you and your health needs are our priority.  As part of our continuing mission to provide you with exceptional heart care, we have created designated Provider Care Teams.  These Care Teams include your primary Cardiologist (physician) and Advanced Practice Providers (APPs -  Physician Assistants and Nurse Practitioners) who all work together to provide you with the care you need, when you need it.  We recommend signing up for the patient portal called "MyChart".  Sign up information is provided on this After Visit Summary.  MyChart is used to connect with patients for Virtual Visits (Telemedicine).  Patients are able to view lab/test results, encounter notes, upcoming appointments, etc.  Non-urgent messages can be sent to your provider as well.   To learn more about what you can do with MyChart, go to [email or web address].    Your next appointment:    Your physician recommends that you schedule a follow-up appointment in: 3 MONTHS with Dr. Caryl Comes.  The format for your next appointment:   In person  Provider:   Virl Axe, MD  Other Instructions If your Pacemaker reaches ERI (end of battery) before your next scheduled follow up we will call you to come in sooner and schedule your generator change (battery replacement).

## 2019-11-29 ENCOUNTER — Other Ambulatory Visit: Payer: Self-pay

## 2019-11-29 ENCOUNTER — Ambulatory Visit (INDEPENDENT_AMBULATORY_CARE_PROVIDER_SITE_OTHER): Payer: Medicare HMO | Admitting: *Deleted

## 2019-11-29 DIAGNOSIS — Z5181 Encounter for therapeutic drug level monitoring: Secondary | ICD-10-CM | POA: Diagnosis not present

## 2019-11-29 DIAGNOSIS — I4891 Unspecified atrial fibrillation: Secondary | ICD-10-CM | POA: Diagnosis not present

## 2019-11-29 DIAGNOSIS — I639 Cerebral infarction, unspecified: Secondary | ICD-10-CM

## 2019-11-29 LAB — POCT INR: INR: 2.2 (ref 2.0–3.0)

## 2019-11-29 NOTE — Patient Instructions (Signed)
Continue warfarin 1/2 tablet daily except 1 tablet on Mondays Eat extra greens/salad today Recheck in 4 weeks

## 2019-12-05 NOTE — Telephone Encounter (Signed)
Set up to see Chanetta Marshall, NP.  Per her note will change to Dr. Caryl Comes when [facility]

## 2019-12-15 ENCOUNTER — Other Ambulatory Visit: Payer: Self-pay | Admitting: Family Medicine

## 2019-12-20 ENCOUNTER — Ambulatory Visit (INDEPENDENT_AMBULATORY_CARE_PROVIDER_SITE_OTHER): Payer: Medicare HMO | Admitting: *Deleted

## 2019-12-20 ENCOUNTER — Encounter: Payer: Self-pay | Admitting: *Deleted

## 2019-12-20 DIAGNOSIS — I495 Sick sinus syndrome: Secondary | ICD-10-CM

## 2019-12-20 LAB — CUP PACEART REMOTE DEVICE CHECK
Battery Remaining Longevity: 1 mo
Battery Remaining Percentage: 0.5 %
Battery Voltage: 2.6 V
Brady Statistic RV Percent Paced: 87 %
Date Time Interrogation Session: 20210715021609
Implantable Lead Implant Date: 20100602
Implantable Lead Implant Date: 20100602
Implantable Lead Location: 753859
Implantable Lead Location: 753860
Implantable Pulse Generator Implant Date: 20100602
Lead Channel Impedance Value: 430 Ohm
Lead Channel Pacing Threshold Amplitude: 1 V
Lead Channel Pacing Threshold Pulse Width: 0.4 ms
Lead Channel Sensing Intrinsic Amplitude: 12 mV
Lead Channel Setting Pacing Amplitude: 2.5 V
Lead Channel Setting Pacing Pulse Width: 0.4 ms
Lead Channel Setting Sensing Sensitivity: 2 mV
Pulse Gen Model: 2210
Pulse Gen Serial Number: 2323973

## 2019-12-20 NOTE — Telephone Encounter (Signed)
Opened in error

## 2019-12-21 NOTE — Progress Notes (Signed)
Remote pacemaker transmission.   

## 2019-12-24 ENCOUNTER — Ambulatory Visit: Payer: Medicare HMO | Admitting: Family Medicine

## 2019-12-27 ENCOUNTER — Ambulatory Visit (INDEPENDENT_AMBULATORY_CARE_PROVIDER_SITE_OTHER): Payer: Medicare HMO | Admitting: *Deleted

## 2019-12-27 DIAGNOSIS — I639 Cerebral infarction, unspecified: Secondary | ICD-10-CM

## 2019-12-27 DIAGNOSIS — I4891 Unspecified atrial fibrillation: Secondary | ICD-10-CM | POA: Diagnosis not present

## 2019-12-27 DIAGNOSIS — Z5181 Encounter for therapeutic drug level monitoring: Secondary | ICD-10-CM | POA: Diagnosis not present

## 2019-12-27 LAB — POCT INR: INR: 2.1 (ref 2.0–3.0)

## 2019-12-27 NOTE — Patient Instructions (Signed)
Continue warfarin 1/2 tablet daily except 1 tablet on Mondays Recheck in 6 weeks

## 2019-12-28 ENCOUNTER — Other Ambulatory Visit: Payer: Self-pay | Admitting: Internal Medicine

## 2020-01-21 ENCOUNTER — Ambulatory Visit (INDEPENDENT_AMBULATORY_CARE_PROVIDER_SITE_OTHER): Payer: Medicare HMO | Admitting: *Deleted

## 2020-01-21 DIAGNOSIS — Z45018 Encounter for adjustment and management of other part of cardiac pacemaker: Secondary | ICD-10-CM | POA: Diagnosis not present

## 2020-01-21 DIAGNOSIS — I639 Cerebral infarction, unspecified: Secondary | ICD-10-CM

## 2020-01-21 LAB — CUP PACEART REMOTE DEVICE CHECK
Battery Remaining Longevity: 1 mo
Battery Remaining Percentage: 0.5 %
Battery Voltage: 2.59 V
Brady Statistic RV Percent Paced: 78 %
Date Time Interrogation Session: 20210815022623
Implantable Lead Implant Date: 20100602
Implantable Lead Implant Date: 20100602
Implantable Lead Location: 753859
Implantable Lead Location: 753860
Implantable Pulse Generator Implant Date: 20100602
Lead Channel Impedance Value: 430 Ohm
Lead Channel Pacing Threshold Amplitude: 1 V
Lead Channel Pacing Threshold Pulse Width: 0.4 ms
Lead Channel Sensing Intrinsic Amplitude: 12 mV
Lead Channel Setting Pacing Amplitude: 2.5 V
Lead Channel Setting Pacing Pulse Width: 0.4 ms
Lead Channel Setting Sensing Sensitivity: 2 mV
Pulse Gen Model: 2210
Pulse Gen Serial Number: 2323973

## 2020-01-22 NOTE — Progress Notes (Signed)
Remote pacemaker transmission.   

## 2020-02-20 LAB — CUP PACEART REMOTE DEVICE CHECK
Battery Remaining Longevity: 1 mo
Battery Remaining Percentage: 0.5 %
Battery Voltage: 2.59 V
Brady Statistic RV Percent Paced: 75 %
Date Time Interrogation Session: 20210915023704
Implantable Lead Implant Date: 20100602
Implantable Lead Implant Date: 20100602
Implantable Lead Location: 753859
Implantable Lead Location: 753860
Implantable Pulse Generator Implant Date: 20100602
Lead Channel Impedance Value: 430 Ohm
Lead Channel Pacing Threshold Amplitude: 1 V
Lead Channel Pacing Threshold Pulse Width: 0.4 ms
Lead Channel Sensing Intrinsic Amplitude: 12 mV
Lead Channel Setting Pacing Amplitude: 2.5 V
Lead Channel Setting Pacing Pulse Width: 0.4 ms
Lead Channel Setting Sensing Sensitivity: 2 mV
Pulse Gen Model: 2210
Pulse Gen Serial Number: 2323973

## 2020-02-21 ENCOUNTER — Encounter: Payer: Self-pay | Admitting: *Deleted

## 2020-02-21 ENCOUNTER — Ambulatory Visit (INDEPENDENT_AMBULATORY_CARE_PROVIDER_SITE_OTHER): Payer: Medicare HMO | Admitting: *Deleted

## 2020-02-21 DIAGNOSIS — I495 Sick sinus syndrome: Secondary | ICD-10-CM

## 2020-02-21 NOTE — Telephone Encounter (Signed)
Opened in error

## 2020-02-22 NOTE — Progress Notes (Signed)
Remote pacemaker transmission.   

## 2020-02-26 ENCOUNTER — Ambulatory Visit (INDEPENDENT_AMBULATORY_CARE_PROVIDER_SITE_OTHER): Payer: Medicare HMO | Admitting: *Deleted

## 2020-02-26 DIAGNOSIS — I639 Cerebral infarction, unspecified: Secondary | ICD-10-CM

## 2020-02-26 DIAGNOSIS — I4891 Unspecified atrial fibrillation: Secondary | ICD-10-CM | POA: Diagnosis not present

## 2020-02-26 DIAGNOSIS — Z5181 Encounter for therapeutic drug level monitoring: Secondary | ICD-10-CM | POA: Diagnosis not present

## 2020-02-26 LAB — POCT INR: INR: 1.5 — AB (ref 2.0–3.0)

## 2020-02-26 NOTE — Patient Instructions (Signed)
Take warfarin 1 tablet tonight and tomorrow then resume 1/2 tablet daily except 1 tablet on Mondays Recheck in 3 weeks

## 2020-02-29 ENCOUNTER — Other Ambulatory Visit: Payer: Self-pay

## 2020-02-29 ENCOUNTER — Ambulatory Visit: Payer: Medicare HMO | Admitting: Internal Medicine

## 2020-02-29 ENCOUNTER — Other Ambulatory Visit: Payer: Self-pay | Admitting: *Deleted

## 2020-02-29 VITALS — BP 156/74 | HR 75 | Ht 65.0 in | Wt 171.0 lb

## 2020-02-29 DIAGNOSIS — Z950 Presence of cardiac pacemaker: Secondary | ICD-10-CM

## 2020-02-29 DIAGNOSIS — I495 Sick sinus syndrome: Secondary | ICD-10-CM | POA: Diagnosis not present

## 2020-02-29 DIAGNOSIS — I4821 Permanent atrial fibrillation: Secondary | ICD-10-CM | POA: Diagnosis not present

## 2020-02-29 DIAGNOSIS — Z01812 Encounter for preprocedural laboratory examination: Secondary | ICD-10-CM

## 2020-02-29 LAB — CBC
Hematocrit: 35.6 % (ref 34.0–46.6)
Hemoglobin: 11.4 g/dL (ref 11.1–15.9)
MCH: 28.6 pg (ref 26.6–33.0)
MCHC: 32 g/dL (ref 31.5–35.7)
MCV: 89 fL (ref 79–97)
Platelets: 194 10*3/uL (ref 150–450)
RBC: 3.98 x10E6/uL (ref 3.77–5.28)
RDW: 13.1 % (ref 11.7–15.4)
WBC: 5.8 10*3/uL (ref 3.4–10.8)

## 2020-02-29 LAB — BASIC METABOLIC PANEL
BUN/Creatinine Ratio: 17 (ref 12–28)
BUN: 32 mg/dL (ref 10–36)
CO2: 22 mmol/L (ref 20–29)
Calcium: 10.8 mg/dL — ABNORMAL HIGH (ref 8.7–10.3)
Chloride: 106 mmol/L (ref 96–106)
Creatinine, Ser: 1.88 mg/dL — ABNORMAL HIGH (ref 0.57–1.00)
GFR calc Af Amer: 27 mL/min/{1.73_m2} — ABNORMAL LOW (ref >59)
GFR calc non Af Amer: 23 mL/min/{1.73_m2} — ABNORMAL LOW (ref >59)
Glucose: 99 mg/dL (ref 65–99)
Potassium: 4.5 mmol/L (ref 3.5–5.2)
Sodium: 143 mmol/L (ref 134–144)

## 2020-02-29 NOTE — H&P (View-Only) (Signed)
Patient Care Team: Kathyrn Drown, MD as PCP - General (Family Medicine) Evans Lance, MD as PCP - Electrophysiology (Cardiology) Carole Civil, MD (Orthopedic Surgery) Evans Lance, MD (Cardiology)   HPI  [name] is a 86 y.o. female seen in follow-up for pacemaker implanted 2010 intercurrently followed by Dr. Elliot Cousin and now near [facility]. She has Atrial fibrillation, presumed permanent and is anticoagulated with warfarin  Ambulates with some dyspnea, mild edema but no chest pain   Date Cr K Hgb  3/21 2.19 4.8 10.7          DATE TEST EF   3/21 Echo   60-65 % LAE severe          Thromboembolic risk factors ( age  -2, HTN-1, TIA/CVA-2, CHF-1, Gender-1) for a CHADSVASc Score of>= 7   Records and Results Reviewed   Past Medical History:  Diagnosis Date  . Anemia    H/H of 11.3/36.7 in 12/2008 ;normal MCV; normal CBC in 2011  . Angioedema 08/2006  . Atrial fibrillation (HCC)    persistant  . 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   Ultrasound 12/29/2018 demonstrates cyst no further work-up recommended by criteria  . Blood transfusion   . 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 Chronic bronchitis   . Chronic renal insufficiency    baseline creatine 1.4; 1.04 in 03/2010  . DJD (degenerative joint disease)    hands, knees  . Gout   . Hyperlipidemia   . Hypertension    heart disease diastolic dysfunction ; VE-93%; mild pulmonary edema in 2006;responded to diurectics ; LVH  . Kidney stones   . Mitral valve disease    Severe mitral annular calcification; mild to moderate stenosis-not clearly rheumatic  . Nephrolithiasis   . Pacemaker   . Retinal hemorrhage   . Seizure disorder ([place])   . Shortness of breath    only with exertion  . Stroke ([place])   .  Tachycardia-bradycardia syndrome ([place])    with pauses and syncope;PPM implantation 10/2008,negative stress nuclear study 03/2005  . Vertigo     Past Surgical History:  Procedure Laterality Date  . CATARACT EXTRACTION W/PHACO  03/29/2011   Procedure: CATARACT EXTRACTION PHACO AND INTRAOCULAR LENS PLACEMENT (IOC);  Surgeon: Tonny Branch;  Location: [facility];  Service: Ophthalmology;  Laterality: Right;  CDE 12.62  . INSERT / REPLACE / REMOVE PACEMAKER  11/2008   St.Jude DUAL chamber pacemaker 11/06/2008  . MEMBRANE PEEL Right 02/12/2014   Procedure: MEMBRANE PEEL;  Surgeon: [name], MD;  Location: [place];  Service: Ophthalmology;  Laterality: Right;  . PARS PLANA VITRECTOMY Right 02/12/2014   Procedure: PARS PLANA VITRECTOMY WITH 25 GAUGE;  Surgeon: [name], MD;  Location: [place];  Service: Ophthalmology;  Laterality: Right;  . TOTAL KNEE ARTHROPLASTY  04/2009   Right   Current Outpatient Medications on File Prior to Visit  Medication Sig Dispense Refill  . albuterol (VENTOLIN HFA) 108 (90 Base) MCG/ACT inhaler TAKE 2 PUFFS EVERY 6 HOURS AS NEEDED 18 g 2  . allopurinol (ZYLOPRIM) 100 MG tablet TAKE 1 TABLET BY MOUTH ONCE DAILY. 30 tablet 2  . carvedilol (COREG) 6.25 MG tablet Take 1 tablet (6.25 mg total) by mouth 2 (two) times daily. 180 tablet 3  .  diltiazem (TIAZAC) 360 MG 24 hr capsule TAKE ONE CAPSULE BY MOUTH ONCE DAILY. 90 capsule 0  . furosemide (LASIX) 20 MG tablet Take one and a half tablets once daily 135 tablet 0  . lisinopril (ZESTRIL) 20 MG tablet TAKE (1) TABLET BY MOUTH ONCE DAILY. 90 tablet 2  . meclizine (ANTIVERT) 25 MG tablet TAKE (1) TABLET BY MOUTH (3) TIMES DAILY AS NEEDED. 20 tablet 0  . pravastatin (PRAVACHOL) 80 MG tablet TAKE (1) TABLET BY MOUTH AT BEDTIME. 90 tablet 1  . Travoprost, BAK Free, (TRAVATAN) 0.004 % SOLN ophthalmic solution Place 1 drop into both eyes at bedtime.     Marland Kitchen warfarin (COUMADIN) 5 MG tablet TAKE 1/2 TO 1 TABLET BY MOUTH DAILY OR AS  DIRECTED. 30 tablet 6   No current facility-administered medications on file prior to visit.     No outpatient medications have been marked as taking for the 02/29/20 encounter (Office Visit) with Deboraha Sprang, MD.    Allergies  Allergen Reactions  . Tramadol     Drowsiness      Review of Systems negative except from HPI and PMH  Physical Exam BP (!) 156/74   Pulse 75   Ht 5\' 5"  (1.651 m)   Wt 171 lb (77.6 kg)   SpO2 97%   BMI 28.46 kg/m  Well developed and well nourished in no acute distress HENT normal E scleral and icterus clear Neck Supple Clear to ausculation Irregularly irregular and rhythm, 2/6 murmurs gallops or rub Soft with active bowel sounds No clubbing cyanosis Trace Edema Alert and oriented, grossly normal motor and sensory function Skin Warm and Dry  ECG afib 73  CrCl cannot be calculated (Patient's most recent lab result is older than the maximum 21 days allowed.).   Assessment and  Plan  Bradycardia  Atrial fib-long standing persistent  Pacemaker Saint Jude approaching ERI  Renal insufficiency grade 4   The patient's device is approaching ERI.  We will plan to follow it remotely and then to change it out without a repeat office visit.  We have discussed the risks and benefits of generator replacement including but not limited infection and lead fracture.  They voiced understanding and agree.  Blood pressure is modestly elevated.  We will follow.       Current medicines are reviewed at length with the patient today .  The patient does not  have concerns regarding medicines.

## 2020-02-29 NOTE — Progress Notes (Signed)
Patient Care Team: Kathyrn Drown, MD as PCP - General (Family Medicine) Evans Lance, MD as PCP - Electrophysiology (Cardiology) Carole Civil, MD (Orthopedic Surgery) Evans Lance, MD (Cardiology)   HPI  [name] is a 86 y.o. female seen in follow-up for pacemaker implanted 2010 intercurrently followed by Dr. Elliot Cousin and now near [facility]. She has Atrial fibrillation, presumed permanent and is anticoagulated with warfarin  Ambulates with some dyspnea, mild edema but no chest pain   Date Cr K Hgb  3/21 2.19 4.8 10.7          DATE TEST EF   3/21 Echo   60-65 % LAE severe          Thromboembolic risk factors ( age  -2, HTN-1, TIA/CVA-2, CHF-1, Gender-1) for a CHADSVASc Score of>= 7   Records and Results Reviewed   Past Medical History:  Diagnosis Date  . Anemia    H/H of 11.3/36.7 in 12/2008 ;normal MCV; normal CBC in 2011  . Angioedema 08/2006  . Atrial fibrillation (HCC)    persistant  . 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   Ultrasound 12/29/2018 demonstrates cyst no further work-up recommended by criteria  . Blood transfusion   . 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 Chronic bronchitis   . Chronic renal insufficiency    baseline creatine 1.4; 1.04 in 03/2010  . DJD (degenerative joint disease)    hands, knees  . Gout   . Hyperlipidemia   . Hypertension    heart disease diastolic dysfunction ; KZ-60%; mild pulmonary edema in 2006;responded to diurectics ; LVH  . Kidney stones   . Mitral valve disease    Severe mitral annular calcification; mild to moderate stenosis-not clearly rheumatic  . Nephrolithiasis   . Pacemaker   . Retinal hemorrhage   . Seizure disorder ([place])   . Shortness of breath    only with exertion  . Stroke ([place])   .  Tachycardia-bradycardia syndrome ([place])    with pauses and syncope;PPM implantation 10/2008,negative stress nuclear study 03/2005  . Vertigo     Past Surgical History:  Procedure Laterality Date  . CATARACT EXTRACTION W/PHACO  03/29/2011   Procedure: CATARACT EXTRACTION PHACO AND INTRAOCULAR LENS PLACEMENT (IOC);  Surgeon: Tonny Branch;  Location: [facility];  Service: Ophthalmology;  Laterality: Right;  CDE 12.62  . INSERT / REPLACE / REMOVE PACEMAKER  11/2008   St.Jude DUAL chamber pacemaker 11/06/2008  . MEMBRANE PEEL Right 02/12/2014   Procedure: MEMBRANE PEEL;  Surgeon: [name], MD;  Location: [place];  Service: Ophthalmology;  Laterality: Right;  . PARS PLANA VITRECTOMY Right 02/12/2014   Procedure: PARS PLANA VITRECTOMY WITH 25 GAUGE;  Surgeon: [name], MD;  Location: [place];  Service: Ophthalmology;  Laterality: Right;  . TOTAL KNEE ARTHROPLASTY  04/2009   Right   Current Outpatient Medications on File Prior to Visit  Medication Sig Dispense Refill  . albuterol (VENTOLIN HFA) 108 (90 Base) MCG/ACT inhaler TAKE 2 PUFFS EVERY 6 HOURS AS NEEDED 18 g 2  . allopurinol (ZYLOPRIM) 100 MG tablet TAKE 1 TABLET BY MOUTH ONCE DAILY. 30 tablet 2  . carvedilol (COREG) 6.25 MG tablet Take 1 tablet (6.25 mg total) by mouth 2 (two) times daily. 180 tablet 3  .  diltiazem (TIAZAC) 360 MG 24 hr capsule TAKE ONE CAPSULE BY MOUTH ONCE DAILY. 90 capsule 0  . furosemide (LASIX) 20 MG tablet Take one and a half tablets once daily 135 tablet 0  . lisinopril (ZESTRIL) 20 MG tablet TAKE (1) TABLET BY MOUTH ONCE DAILY. 90 tablet 2  . meclizine (ANTIVERT) 25 MG tablet TAKE (1) TABLET BY MOUTH (3) TIMES DAILY AS NEEDED. 20 tablet 0  . pravastatin (PRAVACHOL) 80 MG tablet TAKE (1) TABLET BY MOUTH AT BEDTIME. 90 tablet 1  . Travoprost, BAK Free, (TRAVATAN) 0.004 % SOLN ophthalmic solution Place 1 drop into both eyes at bedtime.     Marland Kitchen warfarin (COUMADIN) 5 MG tablet TAKE 1/2 TO 1 TABLET BY MOUTH DAILY OR AS  DIRECTED. 30 tablet 6   No current facility-administered medications on file prior to visit.     No outpatient medications have been marked as taking for the 02/29/20 encounter (Office Visit) with Deboraha Sprang, MD.    Allergies  Allergen Reactions  . Tramadol     Drowsiness      Review of Systems negative except from HPI and PMH  Physical Exam BP (!) 156/74   Pulse 75   Ht 5\' 5"  (1.651 m)   Wt 171 lb (77.6 kg)   SpO2 97%   BMI 28.46 kg/m  Well developed and well nourished in no acute distress HENT normal E scleral and icterus clear Neck Supple Clear to ausculation Irregularly irregular and rhythm, 2/6 murmurs gallops or rub Soft with active bowel sounds No clubbing cyanosis Trace Edema Alert and oriented, grossly normal motor and sensory function Skin Warm and Dry  ECG afib 73  CrCl cannot be calculated (Patient's most recent lab result is older than the maximum 21 days allowed.).   Assessment and  Plan  Bradycardia  Atrial fib-long standing persistent  Pacemaker Saint Jude approaching ERI  Renal insufficiency grade 4   The patient's device is approaching ERI.  We will plan to follow it remotely and then to change it out without a repeat office visit.  We have discussed the risks and benefits of generator replacement including but not limited infection and lead fracture.  They voiced understanding and agree.  Blood pressure is modestly elevated.  We will follow.       Current medicines are reviewed at length with the patient today .  The patient does not  have concerns regarding medicines.

## 2020-02-29 NOTE — Patient Instructions (Signed)
Medication Instructions:   Your physician recommends that you continue on your current medications as directed. Please refer to the Current Medication list given to you today.  *If you need a refill on your cardiac medications before your next appointment, please call your pharmacy*   Lab Work: CBC and BMET today  If you have labs (blood work) drawn today and your tests are completely normal, you will receive your results only by: Marland Kitchen MyChart Message (if you have MyChart) OR . A paper copy in the mail If you have any lab test that is abnormal or we need to change your treatment, we will call you to review the results.   Testing/Procedures: Pacemaker Generator Change Your physician has recommended that you have a pacemaker inserted. A pacemaker is a small device that is placed under the skin of your chest or abdomen to help control abnormal heart rhythms. This device uses electrical pulses to prompt the heart to beat at a normal rate. Pacemakers are used to treat heart rhythms that are too slow. Wire (leads) are attached to the pacemaker that goes into the chambers of you heart. This is done in the hospital and usually requires and overnight stay. Please see the instruction sheet given to you today for more information.     Follow-Up: To be scheuled by Dr Olin Pia scheduler At [facility], you and your health needs are our priority.  As part of our continuing mission to provide you with exceptional heart care, we have created designated Provider Care Teams.  These Care Teams include your primary Cardiologist (physician) and Advanced Practice Providers (APPs -  Physician Assistants and Nurse Practitioners) who all work together to provide you with the care you need, when you need it.  We recommend signing up for the patient portal called "MyChart".  Sign up information is provided on this After Visit Summary.  MyChart is used to connect with patients for Virtual Visits (Telemedicine).   Patients are able to view lab/test results, encounter notes, upcoming appointments, etc.  Non-urgent messages can be sent to your provider as well.   To learn more about what you can do with MyChart, go to [email or web address].

## 2020-03-03 DIAGNOSIS — L84 Corns and callosities: Secondary | ICD-10-CM | POA: Diagnosis not present

## 2020-03-03 DIAGNOSIS — B351 Tinea unguium: Secondary | ICD-10-CM | POA: Diagnosis not present

## 2020-03-03 DIAGNOSIS — I739 Peripheral vascular disease, unspecified: Secondary | ICD-10-CM | POA: Diagnosis not present

## 2020-03-03 LAB — CUP PACEART INCLINIC DEVICE CHECK
Battery Remaining Longevity: 0 mo
Battery Voltage: 2.59 V
Brady Statistic RA Percent Paced: 0 %
Brady Statistic RV Percent Paced: 75 %
Date Time Interrogation Session: 20210924171652
Implantable Lead Implant Date: 20100602
Implantable Lead Implant Date: 20100602
Implantable Lead Location: 753859
Implantable Lead Location: 753860
Implantable Pulse Generator Implant Date: 20100602
Lead Channel Impedance Value: 450 Ohm
Lead Channel Pacing Threshold Amplitude: 1 V
Lead Channel Pacing Threshold Amplitude: 1 V
Lead Channel Pacing Threshold Amplitude: 1.125 V
Lead Channel Pacing Threshold Pulse Width: 0.4 ms
Lead Channel Pacing Threshold Pulse Width: 0.4 ms
Lead Channel Pacing Threshold Pulse Width: 0.4 ms
Lead Channel Sensing Intrinsic Amplitude: 12 mV
Lead Channel Sensing Intrinsic Amplitude: 5 mV
Lead Channel Setting Pacing Amplitude: 2.5 V
Lead Channel Setting Pacing Pulse Width: 0.4 ms
Lead Channel Setting Sensing Sensitivity: 2 mV
Pulse Gen Model: 2210
Pulse Gen Serial Number: 2323973

## 2020-03-03 MED ORDER — FUROSEMIDE 20 MG PO TABS
ORAL_TABLET | ORAL | 0 refills | Status: DC
Start: 2020-03-03 — End: 2020-04-23

## 2020-03-03 NOTE — Telephone Encounter (Signed)
vm box full

## 2020-03-03 NOTE — Telephone Encounter (Signed)
Made appt for Med Check had to schedule out till 10/27 pt is having Covid testing and pacemaker put in in middle of October.  Pt call back [phone]

## 2020-03-04 ENCOUNTER — Telehealth: Payer: Self-pay

## 2020-03-04 NOTE — Telephone Encounter (Signed)
Spoke with pt's daughter,DPR and reviewed pt's device instruction letter.  See letter for complete details.  Pt's daughter verbalizes understanding and agrees with current plan.  Updated hard copy of letter mailed to address on file in [place].

## 2020-03-04 NOTE — Telephone Encounter (Signed)
Attempted phone call to pt.  Left voicemail message to contact RN at [phone] re: device instruction letter.

## 2020-03-04 NOTE — Telephone Encounter (Signed)
[name] is returning [name]'s call. Please advise.

## 2020-03-07 ENCOUNTER — Telehealth: Payer: Self-pay

## 2020-03-07 NOTE — Telephone Encounter (Signed)
-----   Message from Deboraha Sprang, MD sent at 03/04/2020 10:17 PM EDT ----- Please Inform Patient  Labs are normal x #renal function which is improved   Thanks

## 2020-03-07 NOTE — Telephone Encounter (Signed)
Spoke with pt's daughter, DPR and advised pt's labs are normal with improved renal functions.  Pt's daughter verbalizes understanding and thanked Therapist, sports for call.

## 2020-03-12 ENCOUNTER — Other Ambulatory Visit: Payer: Medicare HMO

## 2020-03-13 ENCOUNTER — Ambulatory Visit (INDEPENDENT_AMBULATORY_CARE_PROVIDER_SITE_OTHER): Payer: Medicare HMO | Admitting: *Deleted

## 2020-03-13 DIAGNOSIS — Z5181 Encounter for therapeutic drug level monitoring: Secondary | ICD-10-CM | POA: Diagnosis not present

## 2020-03-13 DIAGNOSIS — I639 Cerebral infarction, unspecified: Secondary | ICD-10-CM

## 2020-03-13 DIAGNOSIS — I4891 Unspecified atrial fibrillation: Secondary | ICD-10-CM | POA: Diagnosis not present

## 2020-03-13 LAB — POCT INR: INR: 1.8 — AB (ref 2.0–3.0)

## 2020-03-13 NOTE — Patient Instructions (Signed)
Take warfarin 1/2 tablet tonight then take 1/4 tablet Friday, Saturday, Sunday.  Night of procedure restart warfarin 1/2 tablet daily except 1 tablet on Mondays and Thursdays (Per Dr. Olin Pia instructions) Recheck in 2 weeks

## 2020-03-14 ENCOUNTER — Telehealth: Payer: Self-pay

## 2020-03-14 ENCOUNTER — Other Ambulatory Visit (HOSPITAL_COMMUNITY)
Admission: RE | Admit: 2020-03-14 | Discharge: 2020-03-14 | Disposition: A | Discharge status: Home / Self Care | Payer: Medicare HMO | Source: Ambulatory Visit | Attending: Internal Medicine | Admitting: Internal Medicine

## 2020-03-14 DIAGNOSIS — Z20822 Contact with and (suspected) exposure to covid-19: Secondary | ICD-10-CM | POA: Insufficient documentation

## 2020-03-14 DIAGNOSIS — Z01812 Encounter for preprocedural laboratory examination: Secondary | ICD-10-CM | POA: Insufficient documentation

## 2020-03-14 LAB — SARS CORONAVIRUS 2 (TAT 6-24 HRS): SARS Coronavirus 2: NEGATIVE

## 2020-03-14 NOTE — Progress Notes (Signed)
Attempted to contact patient regarding procedure instructions tomorrow.  Unable to leave a voice mail, mailbox is full.

## 2020-03-14 NOTE — Telephone Encounter (Signed)
Attempted phone call to pt's daughter, DPR re pt's procedure time change for 03/17/2020.  Left voicemail message to contact RN at [phone]

## 2020-03-14 NOTE — Telephone Encounter (Signed)
Spoke with pt's daughter [name], [place] and advised pt's procedure report time has been changed to 03/17/2020 at 1030am at [facility].  Pt;s daughter verbalizes understanding and agrees with current plan.

## 2020-03-17 ENCOUNTER — Other Ambulatory Visit: Payer: Self-pay

## 2020-03-17 ENCOUNTER — Encounter (HOSPITAL_COMMUNITY)
Admission: RE | Disposition: A | Discharge status: Home / Self Care | Payer: Self-pay | Source: Home / Self Care | Attending: Internal Medicine

## 2020-03-17 ENCOUNTER — Ambulatory Visit (HOSPITAL_COMMUNITY)
Admission: RE | Admit: 2020-03-17 | Discharge: 2020-03-17 | Disposition: A | Discharge status: Home / Self Care | Payer: Medicare HMO | Attending: Internal Medicine | Admitting: Internal Medicine

## 2020-03-17 DIAGNOSIS — N189 Chronic kidney disease, unspecified: Secondary | ICD-10-CM | POA: Diagnosis not present

## 2020-03-17 DIAGNOSIS — Z96651 Presence of right artificial knee joint: Secondary | ICD-10-CM | POA: Insufficient documentation

## 2020-03-17 DIAGNOSIS — I495 Sick sinus syndrome: Secondary | ICD-10-CM | POA: Insufficient documentation

## 2020-03-17 DIAGNOSIS — I129 Hypertensive chronic kidney disease with stage 1 through stage 4 chronic kidney disease, or unspecified chronic kidney disease: Secondary | ICD-10-CM | POA: Insufficient documentation

## 2020-03-17 DIAGNOSIS — E785 Hyperlipidemia, unspecified: Secondary | ICD-10-CM | POA: Insufficient documentation

## 2020-03-17 DIAGNOSIS — Z7901 Long term (current) use of anticoagulants: Secondary | ICD-10-CM | POA: Diagnosis not present

## 2020-03-17 DIAGNOSIS — M109 Gout, unspecified: Secondary | ICD-10-CM | POA: Insufficient documentation

## 2020-03-17 DIAGNOSIS — G40909 Epilepsy, unspecified, not intractable, without status epilepticus: Secondary | ICD-10-CM | POA: Insufficient documentation

## 2020-03-17 DIAGNOSIS — M1990 Unspecified osteoarthritis, unspecified site: Secondary | ICD-10-CM | POA: Diagnosis not present

## 2020-03-17 DIAGNOSIS — Z79899 Other long term (current) drug therapy: Secondary | ICD-10-CM | POA: Insufficient documentation

## 2020-03-17 DIAGNOSIS — Z45010 Encounter for checking and testing of cardiac pacemaker pulse generator [battery]: Secondary | ICD-10-CM | POA: Diagnosis not present

## 2020-03-17 DIAGNOSIS — Z888 Allergy status to other drugs, medicaments and biological substances status: Secondary | ICD-10-CM | POA: Insufficient documentation

## 2020-03-17 DIAGNOSIS — R001 Bradycardia, unspecified: Secondary | ICD-10-CM | POA: Insufficient documentation

## 2020-03-17 DIAGNOSIS — I4891 Unspecified atrial fibrillation: Secondary | ICD-10-CM | POA: Insufficient documentation

## 2020-03-17 DIAGNOSIS — Z8673 Personal history of transient ischemic attack (TIA), and cerebral infarction without residual deficits: Secondary | ICD-10-CM | POA: Insufficient documentation

## 2020-03-17 HISTORY — PX: PPM GENERATOR CHANGEOUT: EP1233

## 2020-03-17 LAB — PROTIME-INR
INR: 1.5 — ABNORMAL HIGH (ref 0.8–1.2)
Prothrombin Time: 17.1 seconds — ABNORMAL HIGH (ref 11.4–15.2)

## 2020-03-17 SURGERY — PPM GENERATOR CHANGEOUT

## 2020-03-17 MED ORDER — CHLORHEXIDINE GLUCONATE 4 % EX LIQD
4.0000 "application " | Freq: Once | CUTANEOUS | Status: DC
  Filled 2020-03-17: qty 60

## 2020-03-17 MED ORDER — SODIUM CHLORIDE 0.9 % IV SOLN: INTRAVENOUS | Status: DC

## 2020-03-17 MED ORDER — LIDOCAINE HCL (PF) 1 % IJ SOLN
INTRAMUSCULAR | Status: DC | PRN
  Administered 2020-03-17: 60 mL

## 2020-03-17 MED ORDER — DILTIAZEM HCL ER COATED BEADS 360 MG PO CP24
360.0000 mg | ORAL_CAPSULE | Freq: Every day | ORAL | Status: DC
  Administered 2020-03-17: 360 mg via ORAL
  Filled 2020-03-17: qty 1

## 2020-03-17 MED ORDER — SODIUM CHLORIDE 0.9 % IV SOLN
80.0000 mg | INTRAVENOUS | Status: AC
  Administered 2020-03-17: 80 mg
  Filled 2020-03-17: qty 2

## 2020-03-17 MED ORDER — LISINOPRIL 20 MG PO TABS
20.0000 mg | ORAL_TABLET | Freq: Every day | ORAL | Status: DC
  Administered 2020-03-17: 20 mg via ORAL
  Filled 2020-03-17: qty 1

## 2020-03-17 MED ORDER — CARVEDILOL 6.25 MG PO TABS
6.2500 mg | ORAL_TABLET | Freq: Two times a day (BID) | ORAL | Status: DC
  Filled 2020-03-17: qty 1

## 2020-03-17 MED ORDER — CEFAZOLIN SODIUM-DEXTROSE 2-4 GM/100ML-% IV SOLN
2.0000 g | INTRAVENOUS | Status: AC
  Administered 2020-03-17: 2 g via INTRAVENOUS
  Filled 2020-03-17: qty 100

## 2020-03-17 MED ORDER — SODIUM CHLORIDE 0.9 % IV SOLN
INTRAVENOUS | Status: AC
  Filled 2020-03-17: qty 2

## 2020-03-17 MED ORDER — CEFAZOLIN SODIUM-DEXTROSE 2-4 GM/100ML-% IV SOLN
INTRAVENOUS | Status: AC
  Filled 2020-03-17: qty 100

## 2020-03-17 MED ORDER — ALBUTEROL SULFATE HFA 108 (90 BASE) MCG/ACT IN AERS
2.0000 | INHALATION_SPRAY | RESPIRATORY_TRACT | Status: DC
  Administered 2020-03-17: 2 via RESPIRATORY_TRACT
  Filled 2020-03-17: qty 6.7

## 2020-03-17 MED ORDER — ACETAMINOPHEN 325 MG PO TABS
325.0000 mg | ORAL_TABLET | ORAL | Status: DC | PRN
  Filled 2020-03-17: qty 2

## 2020-03-17 SURGICAL SUPPLY — 5 items
CABLE SURGICAL S-101-97-12 (CABLE) ×2 IMPLANT
HEMOSTAT SURGICEL 2X4 FIBR (HEMOSTASIS) ×1 IMPLANT
PACEMAKER ASSURITY DR-RF (Pacemaker) ×1 IMPLANT
PAD PRO RADIOLUCENT 2001M-C (PAD) ×2 IMPLANT
TRAY PACEMAKER INSERTION (PACKS) ×2 IMPLANT

## 2020-03-17 NOTE — Interval H&P Note (Signed)
History and Physical Interval Note:  03/17/2020 2:22 PM  [name]  has presented today for surgery, with the diagnosis of ERI.  The various methods of treatment have been discussed with the patient and family. After consideration of risks, benefits and other options for treatment, the patient has consented to  Procedure(s): PPM GENERATOR CHANGEOUT (N/A) as a surgical intervention.  The patient's history has been reviewed, patient examined, no change in status, stable for surgery.  I have reviewed the patient's chart and labs.  Questions were answered to the patient's satisfaction.     Virl Axe  Device now at [facility]

## 2020-03-17 NOTE — Progress Notes (Signed)
Patient daughter was given discharge instructions. She verbalized understanding.

## 2020-03-17 NOTE — Interval H&P Note (Signed)
History and Physical Interval Note:  03/17/2020 2:40 PM  [name]  has presented today for surgery, with the diagnosis of ERI.  The various methods of treatment have been discussed with the patient and family. After consideration of risks, benefits and other options for treatment, the patient has consented to  Procedure(s): PPM GENERATOR CHANGEOUT (N/A) as a surgical intervention.  The patient's history has been reviewed, patient examined, no change in status, stable for surgery.  I have reviewed the patient's chart and labs.  Questions were answered to the patient's satisfaction.     Virl Axe  C/o some shortness of breath  " bronchitis" for which seh takes inhaler ( albuterol )

## 2020-03-17 NOTE — Discharge Instructions (Signed)

## 2020-03-18 ENCOUNTER — Encounter (HOSPITAL_COMMUNITY): Payer: Self-pay | Admitting: Internal Medicine

## 2020-03-18 ENCOUNTER — Telehealth: Payer: Self-pay | Admitting: *Deleted

## 2020-03-18 NOTE — Telephone Encounter (Signed)
Patient called stating that she needs to speak with Edrick Oh, RN about her coumdin appointment.

## 2020-03-18 NOTE — Telephone Encounter (Signed)
Confirmed appt with pt.

## 2020-03-19 ENCOUNTER — Other Ambulatory Visit: Payer: Self-pay | Admitting: Family Medicine

## 2020-03-25 ENCOUNTER — Ambulatory Visit (INDEPENDENT_AMBULATORY_CARE_PROVIDER_SITE_OTHER): Payer: Medicare HMO | Admitting: *Deleted

## 2020-03-25 DIAGNOSIS — I4891 Unspecified atrial fibrillation: Secondary | ICD-10-CM | POA: Diagnosis not present

## 2020-03-25 DIAGNOSIS — Z5181 Encounter for therapeutic drug level monitoring: Secondary | ICD-10-CM

## 2020-03-25 DIAGNOSIS — I639 Cerebral infarction, unspecified: Secondary | ICD-10-CM

## 2020-03-25 LAB — POCT INR: INR: 1.5 — AB (ref 2.0–3.0)

## 2020-03-25 NOTE — Patient Instructions (Signed)
Take warfarin 1 tablet today and tomorrow then resume 1/2 tablet daily except 1 tablet on Mondays and Thursdays  Recheck in 2 weeks

## 2020-03-27 ENCOUNTER — Ambulatory Visit (INDEPENDENT_AMBULATORY_CARE_PROVIDER_SITE_OTHER): Payer: Medicare HMO | Admitting: Emergency Medicine

## 2020-03-27 ENCOUNTER — Other Ambulatory Visit: Payer: Self-pay

## 2020-03-27 DIAGNOSIS — I495 Sick sinus syndrome: Secondary | ICD-10-CM | POA: Diagnosis not present

## 2020-03-27 LAB — CUP PACEART INCLINIC DEVICE CHECK
Battery Remaining Longevity: 135 mo
Battery Voltage: 3.08 V
Brady Statistic RA Percent Paced: 0 %
Brady Statistic RV Percent Paced: 38 %
Date Time Interrogation Session: 20211021130554
Implantable Lead Implant Date: 20100602
Implantable Lead Implant Date: 20100602
Implantable Lead Location: 753859
Implantable Lead Location: 753860
Implantable Pulse Generator Implant Date: 20211011
Lead Channel Impedance Value: 450 Ohm
Lead Channel Pacing Threshold Amplitude: 0.75 V
Lead Channel Pacing Threshold Amplitude: 0.75 V
Lead Channel Pacing Threshold Pulse Width: 0.4 ms
Lead Channel Pacing Threshold Pulse Width: 0.4 ms
Lead Channel Sensing Intrinsic Amplitude: 12 mV
Lead Channel Setting Pacing Amplitude: 2.5 V
Lead Channel Setting Pacing Pulse Width: 0.4 ms
Lead Channel Setting Sensing Sensitivity: 2 mV
Pulse Gen Model: 2272
Pulse Gen Serial Number: 6220844

## 2020-03-27 NOTE — Progress Notes (Signed)
Wound check appointment. Dermabond removed. Wound without redness or edema. Incision edges approximated, wound well healed. Normal device function. Programmed VVIR.  RV Thresholds, sensing, and impedances consistent with implant measurements. Device outputs  programmed at  appropriate levels for safety margin in chronic leads. Histogram distribution appropriate for patient and level of activity. No mode switches or high ventricular rates noted.  2 episodes of ventricular noise recorded, both noted to have occurred during implant procedure.   Patient educated about wound care, arm mobility, lifting restrictions. ROV in 3 months with implanting physician.

## 2020-04-02 ENCOUNTER — Other Ambulatory Visit: Payer: Self-pay

## 2020-04-02 ENCOUNTER — Encounter: Payer: Self-pay | Admitting: Family Medicine

## 2020-04-02 ENCOUNTER — Ambulatory Visit (INDEPENDENT_AMBULATORY_CARE_PROVIDER_SITE_OTHER): Payer: Medicare HMO | Admitting: Family Medicine

## 2020-04-02 VITALS — BP 138/84 | HR 86 | Temp 97.7°F | Wt 168.6 lb

## 2020-04-02 DIAGNOSIS — N289 Disorder of kidney and ureter, unspecified: Secondary | ICD-10-CM | POA: Diagnosis not present

## 2020-04-02 DIAGNOSIS — E118 Type 2 diabetes mellitus with unspecified complications: Secondary | ICD-10-CM

## 2020-04-02 DIAGNOSIS — I10 Essential (primary) hypertension: Secondary | ICD-10-CM | POA: Diagnosis not present

## 2020-04-02 DIAGNOSIS — G459 Transient cerebral ischemic attack, unspecified: Secondary | ICD-10-CM | POA: Diagnosis not present

## 2020-04-02 DIAGNOSIS — M1000 Idiopathic gout, unspecified site: Secondary | ICD-10-CM

## 2020-04-02 DIAGNOSIS — Z23 Encounter for immunization: Secondary | ICD-10-CM | POA: Diagnosis not present

## 2020-04-02 LAB — POCT GLYCOSYLATED HEMOGLOBIN (HGB A1C): Hemoglobin A1C: 5.3 % (ref 4.0–5.6)

## 2020-04-02 MED ORDER — AMLODIPINE BESYLATE 5 MG PO TABS: 5.0000 mg | ORAL_TABLET | Freq: Every day | ORAL | 4 refills | Status: DC

## 2020-04-02 NOTE — Progress Notes (Signed)
Subjective:    Patient ID: [name], female    DOB: 1926-12-04, 86 y.o.   MRN: [id number]  Hypertension This is a chronic problem. Pertinent negatives include no chest pain or shortness of breath. Treatments tried: Lisinopril. There are no compliance problems.   Hyperlipidemia This is a chronic problem. Pertinent negatives include no chest pain or shortness of breath. Treatments tried: pravastatin. There are no compliance problems.   Diabetes Hypoglycemia symptoms include speech difficulty. Pertinent negatives for hypoglycemia include no confusion or dizziness. Pertinent negatives for diabetes include no chest pain, no fatigue, no polydipsia, no polyphagia and no weakness.  Patient reports pacemaker insertion 2 weeks ago, occasionally feels a slight burning at the site.  Controlled type 2 diabetes mellitus with complication, without long-term current use of insulin ([place]) - Plan: POCT glycosylated hemoglobin (Hb A1C)  Renal insufficiency  Primary hypertension - Plan: Basic metabolic panel  Idiopathic gout, unspecified chronicity, unspecified site - Plan: Uric acid  TIA (transient ischemic attack) - Plan: CT Head Wo Contrast, US Carotid Bilateral  Need for vaccination - Plan: Flu Vaccine QUAD High Dose(Fluad)   Results for orders placed or performed in visit on 04/02/20  POCT glycosylated hemoglobin (Hb A1C)  Result Value Ref Range   Hemoglobin A1C 5.3 4.0 - 5.6 %   HbA1c POC (<> result, manual entry)     HbA1c, POC (prediabetic range)     HbA1c, POC (controlled diabetic range)       Review of Systems  Constitutional: Negative for activity change, appetite change and fatigue.  HENT: Negative for congestion and rhinorrhea.   Respiratory: Negative for cough and shortness of breath.   Cardiovascular: Negative for chest pain and leg swelling.  Gastrointestinal: Negative for abdominal pain and diarrhea.  Endocrine: Negative for polydipsia and polyphagia.  Skin: Negative  for color change.  Neurological: Positive for speech difficulty. Negative for dizziness and weakness.  Psychiatric/Behavioral: Negative for behavioral problems and confusion.       Objective:   Physical Exam Vitals reviewed.  Constitutional:      General: She is not in acute distress. HENT:     Head: Normocephalic and atraumatic.  Eyes:     General:        Right eye: No discharge.        Left eye: No discharge.  Neck:     Trachea: No tracheal deviation.  Cardiovascular:     Rate and Rhythm: Normal rate.     Heart sounds: Normal heart sounds. No murmur heard.   Pulmonary:     Effort: Pulmonary effort is normal. No respiratory distress.     Breath sounds: Normal breath sounds.  Lymphadenopathy:     Cervical: No cervical adenopathy.  Skin:    General: Skin is warm and dry.  Neurological:     Mental Status: She is alert.     Coordination: Coordination normal.  Psychiatric:        Behavior: Behavior normal.    No unilateral numbness or weakness       Assessment & Plan:  1. Controlled type 2 diabetes mellitus with complication, without long-term current use of insulin (HCC) A1c respectable continue current measures. - POCT glycosylated hemoglobin (Hb A1C) patient keeping this under control via dietary measures  2. Renal insufficiency Renal insufficiency importance of vegetables fruits plenty of liquids in the diet  3. Primary hypertension Blood pressure good control continue current measures stop lisinopril because of elevated creatinine start amlodipine patient to recheck kidney  function in 3 weeks follow-up here in a month - Basic metabolic panel  4. Idiopathic gout, unspecified chronicity, unspecified site Check uric acid level potentially stop allopurinol - Uric acid  5. TIA (transient ischemic attack) Recent TIA symptoms yesterday with aphasia that lasted 15 minutes but then went away I told the patient that should she have this ever happen again go to ER CT  scan of the head along with carotid ultrasound ordered - CT Head Wo Contrast; Future - US Carotid Bilateral  6. Need for vaccination Flu vaccine - Flu Vaccine QUAD High Dose(Fluad)

## 2020-04-04 DIAGNOSIS — N289 Disorder of kidney and ureter, unspecified: Secondary | ICD-10-CM | POA: Diagnosis not present

## 2020-04-04 DIAGNOSIS — I10 Essential (primary) hypertension: Secondary | ICD-10-CM | POA: Diagnosis not present

## 2020-04-04 DIAGNOSIS — Z23 Encounter for immunization: Secondary | ICD-10-CM | POA: Diagnosis not present

## 2020-04-04 DIAGNOSIS — G459 Transient cerebral ischemic attack, unspecified: Secondary | ICD-10-CM | POA: Diagnosis not present

## 2020-04-04 DIAGNOSIS — M1000 Idiopathic gout, unspecified site: Secondary | ICD-10-CM | POA: Diagnosis not present

## 2020-04-04 DIAGNOSIS — E118 Type 2 diabetes mellitus with unspecified complications: Secondary | ICD-10-CM | POA: Diagnosis not present

## 2020-04-08 ENCOUNTER — Telehealth: Payer: Self-pay | Admitting: Family Medicine

## 2020-04-08 ENCOUNTER — Ambulatory Visit (INDEPENDENT_AMBULATORY_CARE_PROVIDER_SITE_OTHER): Payer: Medicare HMO | Admitting: *Deleted

## 2020-04-08 DIAGNOSIS — I639 Cerebral infarction, unspecified: Secondary | ICD-10-CM

## 2020-04-08 DIAGNOSIS — Z5181 Encounter for therapeutic drug level monitoring: Secondary | ICD-10-CM | POA: Diagnosis not present

## 2020-04-08 DIAGNOSIS — I4891 Unspecified atrial fibrillation: Secondary | ICD-10-CM

## 2020-04-08 LAB — POCT INR: INR: 2.3 (ref 2.0–3.0)

## 2020-04-08 NOTE — Patient Instructions (Signed)
Continue warfarin 1/2 tablet daily except 1 tablet on Mondays and Thursdays  Recheck in 4 weeks

## 2020-04-16 ENCOUNTER — Ambulatory Visit: Payer: Medicare HMO | Admitting: Family Medicine

## 2020-04-21 ENCOUNTER — Other Ambulatory Visit: Payer: Self-pay | Admitting: Cardiology

## 2020-04-22 ENCOUNTER — Other Ambulatory Visit: Payer: Self-pay | Admitting: Family Medicine

## 2020-04-23 ENCOUNTER — Ambulatory Visit (HOSPITAL_COMMUNITY)
Admission: RE | Admit: 2020-04-23 | Discharge: 2020-04-23 | Disposition: A | Discharge status: Home / Self Care | Payer: Medicare HMO | Source: Ambulatory Visit | Attending: Nurse Practitioner | Admitting: Nurse Practitioner

## 2020-04-23 ENCOUNTER — Other Ambulatory Visit: Payer: Self-pay

## 2020-04-23 ENCOUNTER — Telehealth: Payer: Self-pay | Admitting: Internal Medicine

## 2020-04-23 ENCOUNTER — Encounter (HOSPITAL_COMMUNITY): Payer: Self-pay | Admitting: Nurse Practitioner

## 2020-04-23 VITALS — BP 176/56 | HR 75 | Ht 62.0 in | Wt 176.8 lb

## 2020-04-23 DIAGNOSIS — N189 Chronic kidney disease, unspecified: Secondary | ICD-10-CM | POA: Diagnosis not present

## 2020-04-23 DIAGNOSIS — I517 Cardiomegaly: Secondary | ICD-10-CM | POA: Diagnosis not present

## 2020-04-23 DIAGNOSIS — J90 Pleural effusion, not elsewhere classified: Secondary | ICD-10-CM | POA: Diagnosis not present

## 2020-04-23 DIAGNOSIS — D539 Nutritional anemia, unspecified: Secondary | ICD-10-CM | POA: Diagnosis not present

## 2020-04-23 DIAGNOSIS — Z7901 Long term (current) use of anticoagulants: Secondary | ICD-10-CM | POA: Diagnosis not present

## 2020-04-23 DIAGNOSIS — Z950 Presence of cardiac pacemaker: Secondary | ICD-10-CM | POA: Diagnosis not present

## 2020-04-23 DIAGNOSIS — Z79899 Other long term (current) drug therapy: Secondary | ICD-10-CM | POA: Diagnosis not present

## 2020-04-23 DIAGNOSIS — Z96651 Presence of right artificial knee joint: Secondary | ICD-10-CM | POA: Diagnosis not present

## 2020-04-23 DIAGNOSIS — R0602 Shortness of breath: Secondary | ICD-10-CM | POA: Diagnosis not present

## 2020-04-23 DIAGNOSIS — R0989 Other specified symptoms and signs involving the circulatory and respiratory systems: Secondary | ICD-10-CM | POA: Insufficient documentation

## 2020-04-23 DIAGNOSIS — I4891 Unspecified atrial fibrillation: Secondary | ICD-10-CM

## 2020-04-23 DIAGNOSIS — I130 Hypertensive heart and chronic kidney disease with heart failure and stage 1 through stage 4 chronic kidney disease, or unspecified chronic kidney disease: Secondary | ICD-10-CM | POA: Diagnosis not present

## 2020-04-23 DIAGNOSIS — I4821 Permanent atrial fibrillation: Secondary | ICD-10-CM | POA: Insufficient documentation

## 2020-04-23 DIAGNOSIS — D6869 Other thrombophilia: Secondary | ICD-10-CM

## 2020-04-23 DIAGNOSIS — E1122 Type 2 diabetes mellitus with diabetic chronic kidney disease: Secondary | ICD-10-CM | POA: Insufficient documentation

## 2020-04-23 DIAGNOSIS — I5032 Chronic diastolic (congestive) heart failure: Secondary | ICD-10-CM | POA: Diagnosis not present

## 2020-04-23 DIAGNOSIS — Z8673 Personal history of transient ischemic attack (TIA), and cerebral infarction without residual deficits: Secondary | ICD-10-CM | POA: Insufficient documentation

## 2020-04-23 DIAGNOSIS — R0600 Dyspnea, unspecified: Secondary | ICD-10-CM | POA: Insufficient documentation

## 2020-04-23 DIAGNOSIS — J811 Chronic pulmonary edema: Secondary | ICD-10-CM | POA: Diagnosis not present

## 2020-04-23 LAB — CBC
HCT: 35.2 % — ABNORMAL LOW (ref 36.0–46.0)
Hemoglobin: 10.5 g/dL — ABNORMAL LOW (ref 12.0–15.0)
MCH: 28.1 pg (ref 26.0–34.0)
MCHC: 29.8 g/dL — ABNORMAL LOW (ref 30.0–36.0)
MCV: 94.1 fL (ref 80.0–100.0)
Platelets: 222 10*3/uL (ref 150–400)
RBC: 3.74 MIL/uL — ABNORMAL LOW (ref 3.87–5.11)
RDW: 13.8 % (ref 11.5–15.5)
WBC: 6.7 10*3/uL (ref 4.0–10.5)
nRBC: 0 % (ref 0.0–0.2)

## 2020-04-23 LAB — COMPREHENSIVE METABOLIC PANEL
ALT: 13 U/L (ref 0–44)
AST: 19 U/L (ref 15–41)
Albumin: 4 g/dL (ref 3.5–5.0)
Alkaline Phosphatase: 60 U/L (ref 38–126)
Anion gap: 11 (ref 5–15)
BUN: 38 mg/dL — ABNORMAL HIGH (ref 8–23)
CO2: 22 mmol/L (ref 22–32)
Calcium: 10.3 mg/dL (ref 8.9–10.3)
Chloride: 107 mmol/L (ref 98–111)
Creatinine, Ser: 2.7 mg/dL — ABNORMAL HIGH (ref 0.44–1.00)
GFR, Estimated: 16 mL/min — ABNORMAL LOW (ref >60)
Glucose, Bld: 86 mg/dL (ref 70–99)
Potassium: 4.9 mmol/L (ref 3.5–5.1)
Sodium: 140 mmol/L (ref 135–145)
Total Bilirubin: 0.8 mg/dL (ref 0.3–1.2)
Total Protein: 7 g/dL (ref 6.5–8.1)

## 2020-04-23 LAB — TSH: TSH: 3.536 u[IU]/mL (ref 0.350–4.500)

## 2020-04-23 MED ORDER — FUROSEMIDE 20 MG PO TABS
40.0000 mg | ORAL_TABLET | Freq: Every day | ORAL | 0 refills | Status: DC
Start: 2020-04-23 — End: 2020-04-24

## 2020-04-23 NOTE — Telephone Encounter (Signed)
Spoke with pt who is complaining of bilateral swelling of feet as well as SOB on exertion.  Pt states she has been having to sleep in her recliner.  Pt denies current CP.  She reports she is taking medications as prescribed and saw her PCP recently for this problem but has not had any resolution in the swelling or SOB.  Pt states she does not weigh herself daily does not add salt to her diet nor does she eat highly salted or preserved foods.  Pt sent a PPM transmission earlier today and spoke with device RN.  Transmission shows pt has been in A-FIB and information was forwarded to A-FIB clinic for further evaluation.  Per [place] with A-Fib clinic, pt has been scheduled for appointment in their office this afternoon and daughter will be bringing pt.

## 2020-04-23 NOTE — Telephone Encounter (Signed)
Returning patients phone call.   Patient reports of shortness of breath when she lays down since last week. No swelling in lower legs or abdomen. Denies chest pain or palpations. Reports compliance with all medications including Lasix 20 mg (1.5 tablest daily), coumadin (take as directed).  Requested manual transmission.  Patient reports Pacemaker site looks fine. Denies any swelling, redness or pain to site. Patient advised to call with changes or concerns. Advised patient her body is still adjusting to PPM, it also would be that this device is a different size than her previous device.   Presenting AF 70-150's. Advised patient I will forward to AF Clinic. Agreeable to plan.

## 2020-04-23 NOTE — Telephone Encounter (Signed)
Pt c/o Shortness Of Breath: STAT if SOB developed within the last 24 hours or pt is noticeably SOB on the phone  1. Are you currently SOB (can you hear that pt is SOB on the phone)? no  2. How long have you been experiencing SOB? Since last weeks  3. Are you SOB when sitting or when up moving around? When laying down  4. Are you currently experiencing any other symptoms? Dropping sensation on incision site.   Patient states she has been having SOB when she lays down since last week. She states she also feels a dropping sensation at her incision site of her pacemaker when she turns on her side.

## 2020-04-23 NOTE — Progress Notes (Addendum)
Primary Care Physician: Kathyrn Drown, MD Referring Physician:Device clininc EP: Dr. Delman Kitten [name] is a 86 y.o. female with a h/o htn, permanent afib ,PPM for SSS, with recent gen change out  10/11, TIA, CHF, DM, being seen in the Afib clinic for pt calling into the [facility] office with c/o's of orthopnea since last week.Marland Kitchen She was asked to send in a strip which shows afib with elevated v rates at times. She reported compliance with her lasix 30 mg daily. She is  on warfarin for a CHA2DS2VASc score of 8. Last INR was 2.3 11/2.   In the office, EKG shows  afib nicely rate controlled at 75 bpm. Pt denies any changes to health, new drugs, d/c drugs, no recent illness. She has noted increased  LLE and orthopnea. She is taking 40 mg daily. This was changed from 30 mg daily a couple of weeks ago by PCP for her complaints of her ankle swelling. Her weight is up 10 lbs from Epic visit 10/27.    Today, she denies symptoms of palpitations, chest pain, shortness of breath, orthopnea, PND, lower extremity edema, dizziness, presyncope, syncope, or neurologic sequela. The patient is tolerating medications without difficulties and is otherwise without complaint today.   Past Medical History:  Diagnosis Date  . Anemia    H/H of 11.3/36.7 in 12/2008 ;normal MCV; normal CBC in 2011  . Angioedema 08/2006  . Atrial fibrillation (HCC)    persistant  . 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   Ultrasound 12/29/2018 demonstrates cyst no further work-up recommended by criteria  . Blood transfusion   . 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 Chronic bronchitis   . Chronic renal insufficiency    baseline creatine 1.4; 1.04 in 03/2010  . DJD (degenerative joint disease)    hands, knees  .  Gout   . Hyperlipidemia   . Hypertension    heart disease diastolic dysfunction ; FT-73%; mild pulmonary edema in 2006;responded to diurectics ; LVH  . Kidney stones   . Mitral valve disease    Severe mitral annular calcification; mild to moderate stenosis-not clearly rheumatic  . Nephrolithiasis   . Pacemaker   . Retinal hemorrhage   . Seizure disorder ([place])   . Shortness of breath    only with exertion  . Stroke ([place])   . Tachycardia-bradycardia syndrome ([place])    with pauses and syncope;PPM implantation 10/2008,negative stress nuclear study 03/2005  . Vertigo    Past Surgical History:  Procedure Laterality Date  . CATARACT EXTRACTION W/PHACO  03/29/2011   Procedure: CATARACT EXTRACTION PHACO AND INTRAOCULAR LENS PLACEMENT (IOC);  Surgeon: Tonny Branch;  Location: [facility];  Service: Ophthalmology;  Laterality: Right;  CDE 12.62  . INSERT / REPLACE / REMOVE PACEMAKER  11/2008   [employer] DUAL chamber pacemaker 11/06/2008  . MEMBRANE PEEL Right 02/12/2014   Procedure: MEMBRANE PEEL;  Surgeon: [name], MD;  Location: [facility];  Service: Ophthalmology;  Laterality: Right;  . PARS PLANA VITRECTOMY Right 02/12/2014   Procedure: PARS PLANA VITRECTOMY WITH 25 GAUGE;  Surgeon: [name], MD;  Location: [facility];  Service: Ophthalmology;  Laterality: Right;  . PPM GENERATOR CHANGEOUT N/A 03/17/2020   Procedure: PPM GENERATOR CHANGEOUT;  Surgeon: Deboraha Sprang, MD;  Location: [facility]  INVASIVE CV LAB;  Service: Cardiovascular;  Laterality: N/A;  . TOTAL KNEE ARTHROPLASTY  04/2009   Right    Current Outpatient Medications  Medication Sig Dispense Refill  . albuterol (VENTOLIN HFA) 108 (90 Base) MCG/ACT inhaler TAKE 2 PUFFS EVERY 6 HOURS AS NEEDED 18 g 5  . allopurinol (ZYLOPRIM) 100 MG tablet TAKE 1 TABLET BY MOUTH ONCE DAILY. (Patient taking differently: Take 100 mg by mouth daily. ) 30 tablet 2  . amLODipine (NORVASC) 5 MG tablet Take 1 tablet (5 mg total) by mouth daily. 30 tablet 4  .  diltiazem (TIAZAC) 360 MG 24 hr capsule TAKE ONE CAPSULE BY MOUTH ONCE DAILY. 90 capsule 0  . furosemide (LASIX) 20 MG tablet Take one and a half tablets once daily (Patient taking differently: Take 10 mg by mouth daily. ) 135 tablet 0  . meclizine (ANTIVERT) 25 MG tablet TAKE (1) TABLET BY MOUTH (3) TIMES DAILY AS NEEDED. (Patient taking differently: Take 25 mg by mouth 3 (three) times daily as needed for dizziness. ) 20 tablet 0  . pravastatin (PRAVACHOL) 80 MG tablet TAKE (1) TABLET BY MOUTH AT BEDTIME. (Patient taking differently: Take 80 mg by mouth at bedtime. ) 90 tablet 1  . Travoprost, BAK Free, (TRAVATAN) 0.004 % SOLN ophthalmic solution Place 1 drop into both eyes at bedtime.     Marland Kitchen warfarin (COUMADIN) 5 MG tablet TAKE 1/2 TO 1 TABLET BY MOUTH DAILY OR AS DIRECTED. 30 tablet 4   No current facility-administered medications for this encounter.    Allergies  Allergen Reactions  . Tramadol     Drowsiness    Social History   Socioeconomic History  . Marital status: Married    Spouse name: Not on file  . Number of children: Not on file  . Years of education: Not on file  . Highest education level: Not on file  Occupational History  . Not on file  Tobacco Use  . Smoking status: Never Smoker  . Smokeless tobacco: Never Used  Vaping Use  . Vaping Use: Never used  Substance and Sexual Activity  . Alcohol use: No    Alcohol/week: 0.0 standard drinks  . Drug use: No  . Sexual activity: Not on file  Other Topics Concern  . Not on file  Social History Narrative  . Not on file   Social Determinants of Health   Financial Resource Strain:   . Difficulty of Paying Living Expenses: Not on file  Food Insecurity:   . Worried About Charity fundraiser in the Last Year: Not on file  . Ran Out of Food in the Last Year: Not on file  Transportation Needs:   . Lack of Transportation (Medical): Not on file  . Lack of Transportation (Non-Medical): Not on file  Physical Activity:   .  Days of Exercise per Week: Not on file  . Minutes of Exercise per Session: Not on file  Stress:   . Feeling of Stress : Not on file  Social Connections:   . Frequency of Communication with Friends and Family: Not on file  . Frequency of Social Gatherings with Friends and Family: Not on file  . Attends Religious Services: Not on file  . Active Member of Clubs or Organizations: Not on file  . Attends Archivist Meetings: Not on file  . Marital Status: Not on file  Intimate Partner Violence:   . Fear of Current or Ex-Partner: Not on file  . Emotionally Abused: Not  on file  . Physically Abused: Not on file  . Sexually Abused: Not on file    Family History  Problem Relation Age of Onset  . Cancer Other        unknown cancer  . Hypotension Neg Hx   . Anesthesia problems Neg Hx   . Pseudochol deficiency Neg Hx   . Malignant hyperthermia Neg Hx     ROS- All systems are reviewed and negative except as per the HPI above  Physical Exam: Vitals:   04/23/20 1526  Weight: 80.2 kg  Height: 5\' 2"  (1.575 m)   Wt Readings from Last 3 Encounters:  04/23/20 80.2 kg  04/02/20 76.5 kg  03/17/20 77.6 kg    Labs: Lab Results  Component Value Date   NA 143 02/29/2020   K 4.5 02/29/2020   CL 106 02/29/2020   CO2 22 02/29/2020   GLUCOSE 99 02/29/2020   BUN 32 02/29/2020   CREATININE 1.88 (H) 02/29/2020   CALCIUM 10.8 (H) 02/29/2020   PHOS 4.9 (H) 11/03/2008   MG 2.0 11/03/2008   Lab Results  Component Value Date   INR 2.3 04/08/2020   Lab Results  Component Value Date   CHOL 133 08/08/2019   HDL 71 08/08/2019   LDLCALC 45 08/08/2019   TRIG 90 08/08/2019     GEN- The patient is well appearing, alert and oriented x 3 today.   Head- normocephalic, atraumatic Eyes-  Sclera clear, conjunctiva pink Ears- hearing intact Oropharynx- clear Neck- supple, no JVP Lymph- no cervical lymphadenopathy Lungs- Clear to ausculation bilaterally, normal work of breathing Heart-  irregular rate and rhythm, no murmurs, rubs or gallops, PMI not laterally displaced GI- soft, NT, ND, + BS Extremities- no clubbing, cyanosis, or edema MS- no significant deformity or atrophy Skin- no rash or lesion Psych- euthymic mood, full affect Neuro- strength and sensation are intact  EKG-afib at 75 bpm    Assessment and Plan: 1. Permanent afib She is nicely rate controlled in the office but device clinic noted some elevation of HR's over her usual I feel that afib is not her primary issue as she as lived in [place] for years, well rate controlled No change in approach to afib today  2. Congestive heart failure Her diuretic was increased by PCP a couple of weeks ago for c/o LLE I feel she is in heart failure with a 10 lb weight gain  She has CKD and will need to see if there has been a change to the worse of  her kidney function  Will also check for anemia, thyroid issues as well  CXR today   3. PPM Generator change out performed 10/11  4. CHA2DS2VASc score of 8  Continue  coumadin   I will call pt in the am after I review labs and CXR, I anticipate that if labs look stable, she may need further increase in diuretic.   Addendum: Labs show worsening creatinine at 2.70 previous creatinine at 1.88, CBC with mild drop in hemoglobin at 10.5 HCT at 35.2, (chronic anemia). CXR shows small bilateral effusions, mild cardiomegaly and pulmonary vascular congestion. I discussed with Dr. Caryl Comes. Will change her furosemide tor torsemide 20 mg daily but 1in the am and 1 at 3 pm  for the first 2  days, then 20 mg daily. I will see her back on Monday for repeat bmet and re evaluation. I discussed the above with her daughter and she is aware to stop furosemide and start torsemide.  IF worsening edema, shortness of breath, report to the  ER.     Geroge Baseman Taren Dymek, [facility] [address] [place], [place] [address] [phone]

## 2020-04-23 NOTE — Telephone Encounter (Signed)
Called and spoke with patient's daughter, [name]-EC on file, who is aware of appt today at 3:30 pm with Roderic Palau, NP

## 2020-04-23 NOTE — Telephone Encounter (Signed)
Will send message to device clinic.

## 2020-04-23 NOTE — Telephone Encounter (Signed)
Patient states they she needs to speak with Dr. Caryl Comes or nurse. Please call

## 2020-04-24 ENCOUNTER — Other Ambulatory Visit (HOSPITAL_COMMUNITY): Payer: Self-pay | Admitting: *Deleted

## 2020-04-24 MED ORDER — TORSEMIDE 20 MG PO TABS: ORAL_TABLET | ORAL | 0 refills | Status: DC

## 2020-04-24 NOTE — Addendum Note (Signed)
Encounter addended by: Sherran Needs, NP on: 04/24/2020 1:44 PM  Actions taken: Clinical Note Signed

## 2020-04-28 ENCOUNTER — Encounter (HOSPITAL_COMMUNITY): Payer: Self-pay | Admitting: Nurse Practitioner

## 2020-04-28 ENCOUNTER — Other Ambulatory Visit: Payer: Self-pay

## 2020-04-28 ENCOUNTER — Ambulatory Visit (HOSPITAL_COMMUNITY)
Admission: RE | Admit: 2020-04-28 | Discharge: 2020-04-28 | Disposition: A | Discharge status: Home / Self Care | Payer: Medicare HMO | Source: Ambulatory Visit | Attending: Nurse Practitioner | Admitting: Nurse Practitioner

## 2020-04-28 VITALS — BP 180/54 | HR 74 | Ht 62.0 in | Wt 174.2 lb

## 2020-04-28 DIAGNOSIS — I4811 Longstanding persistent atrial fibrillation: Secondary | ICD-10-CM

## 2020-04-28 DIAGNOSIS — Z7901 Long term (current) use of anticoagulants: Secondary | ICD-10-CM | POA: Diagnosis not present

## 2020-04-28 DIAGNOSIS — Z8673 Personal history of transient ischemic attack (TIA), and cerebral infarction without residual deficits: Secondary | ICD-10-CM | POA: Diagnosis not present

## 2020-04-28 DIAGNOSIS — D6869 Other thrombophilia: Secondary | ICD-10-CM | POA: Diagnosis not present

## 2020-04-28 DIAGNOSIS — I509 Heart failure, unspecified: Secondary | ICD-10-CM | POA: Diagnosis not present

## 2020-04-28 DIAGNOSIS — N189 Chronic kidney disease, unspecified: Secondary | ICD-10-CM | POA: Diagnosis not present

## 2020-04-28 DIAGNOSIS — I4821 Permanent atrial fibrillation: Secondary | ICD-10-CM | POA: Insufficient documentation

## 2020-04-28 DIAGNOSIS — E1122 Type 2 diabetes mellitus with diabetic chronic kidney disease: Secondary | ICD-10-CM | POA: Diagnosis not present

## 2020-04-28 DIAGNOSIS — Z79899 Other long term (current) drug therapy: Secondary | ICD-10-CM | POA: Insufficient documentation

## 2020-04-28 DIAGNOSIS — I130 Hypertensive heart and chronic kidney disease with heart failure and stage 1 through stage 4 chronic kidney disease, or unspecified chronic kidney disease: Secondary | ICD-10-CM | POA: Diagnosis not present

## 2020-04-28 LAB — BASIC METABOLIC PANEL
Anion gap: 10 (ref 5–15)
BUN: 50 mg/dL — ABNORMAL HIGH (ref 8–23)
CO2: 25 mmol/L (ref 22–32)
Calcium: 10.1 mg/dL (ref 8.9–10.3)
Chloride: 101 mmol/L (ref 98–111)
Creatinine, Ser: 3.33 mg/dL — ABNORMAL HIGH (ref 0.44–1.00)
GFR, Estimated: 13 mL/min — ABNORMAL LOW (ref >60)
Glucose, Bld: 125 mg/dL — ABNORMAL HIGH (ref 70–99)
Potassium: 4.7 mmol/L (ref 3.5–5.1)
Sodium: 136 mmol/L (ref 135–145)

## 2020-04-28 NOTE — Progress Notes (Signed)
Primary Care Physician: Kathyrn Drown, MD Referring Physician:Device clininc EP: Dr. Delman Kitten [name] is a 86 y.o. female with a h/o htn, permanent afib ,PPM for SSS, with recent gen change out  10/11, TIA, CHF, DM, being seen in the Afib clinic for pt calling into the [facility] office with c/o's of orthopnea since last week.Marland Kitchen She was asked to send in a strip which shows afib with elevated v rates at times. She reported compliance with her lasix 30 mg daily. She is  on warfarin for a CHA2DS2VASc score of 8. Last INR was 2.3 11/2.   In the office, EKG shows  afib nicely rate controlled at 75 bpm. Pt denies any changes to health, new drugs, d/c drugs, no recent illness. She has noted increased  LLE and orthopnea. She is taking 40 mg daily. This was changed from 30 mg daily a couple of weeks ago by PCP for her complaints of her ankle swelling. Her weight is up 10 lbs from Epic visit 10/27.    F/u in afib clinic,04/28/20. She has switched to torsemide, her weight is down 2.5 lbs. She is feeling better, no further orthopnea. LLE is improved but not yet back to her baseline. BP is elevated, she feels [place] HTN, at  home better controlled. Bmet pending.   Today, she denies symptoms of palpitations, chest pain, shortness of breath, orthopnea, PND, lower extremity edema, dizziness, presyncope, syncope, or neurologic sequela. The patient is tolerating medications without difficulties and is otherwise without complaint today.   Past Medical History:  Diagnosis Date  . Anemia    H/H of 11.3/36.7 in 12/2008 ;normal MCV; normal CBC in 2011  . Angioedema 08/2006  . Atrial fibrillation (HCC)    persistant  . 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   Ultrasound 12/29/2018 demonstrates cyst no further work-up recommended by criteria  . Blood transfusion   . 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 Chronic bronchitis   . Chronic renal insufficiency    baseline creatine 1.4; 1.04 in 03/2010  . DJD (degenerative joint disease)    hands, knees  . Gout   . Hyperlipidemia   . Hypertension    heart disease diastolic dysfunction ; IZ-12%; mild pulmonary edema in 2006;responded to diurectics ; LVH  . Kidney stones   . Mitral valve disease    Severe mitral annular calcification; mild to moderate stenosis-not clearly rheumatic  . Nephrolithiasis   . Pacemaker   . Retinal hemorrhage   . Seizure disorder ([place])   . Shortness of breath    only with exertion  . Stroke ([place])   . Tachycardia-bradycardia syndrome ([place])    with pauses and syncope;PPM implantation 10/2008,negative stress nuclear study 03/2005  . Vertigo    Past Surgical History:  Procedure Laterality Date  . CATARACT EXTRACTION W/PHACO  03/29/2011   Procedure: CATARACT EXTRACTION PHACO AND INTRAOCULAR LENS PLACEMENT (IOC);  Surgeon: Tonny Branch;  Location: [facility];  Service: Ophthalmology;  Laterality: Right;  CDE 12.62  . INSERT / REPLACE / REMOVE PACEMAKER  11/2008   St.Jude DUAL chamber pacemaker 11/06/2008  . MEMBRANE PEEL Right 02/12/2014   Procedure: MEMBRANE PEEL;  Surgeon: [name], MD;  Location: [place];  Service: Ophthalmology;  Laterality: Right;  . PARS PLANA VITRECTOMY  Right 02/12/2014   Procedure: PARS PLANA VITRECTOMY WITH 25 GAUGE;  Surgeon: [name], MD;  Location: [place];  Service: Ophthalmology;  Laterality: Right;  . PPM GENERATOR CHANGEOUT N/A 03/17/2020   Procedure: PPM GENERATOR CHANGEOUT;  Surgeon: Deboraha Sprang, MD;  Location: [place] CV LAB;  Service: Cardiovascular;  Laterality: N/A;  . TOTAL KNEE ARTHROPLASTY  04/2009   Right    Current Outpatient Medications  Medication Sig Dispense Refill  . albuterol (VENTOLIN HFA) 108 (90 Base) MCG/ACT inhaler TAKE 2 PUFFS  EVERY 6 HOURS AS NEEDED 18 g 5  . allopurinol (ZYLOPRIM) 100 MG tablet TAKE 1 TABLET BY MOUTH ONCE DAILY. (Patient taking differently: Take 100 mg by mouth daily. ) 30 tablet 2  . amLODipine (NORVASC) 5 MG tablet Take 1 tablet (5 mg total) by mouth daily. 30 tablet 4  . diltiazem (TIAZAC) 360 MG 24 hr capsule TAKE ONE CAPSULE BY MOUTH ONCE DAILY. 90 capsule 0  . meclizine (ANTIVERT) 25 MG tablet TAKE (1) TABLET BY MOUTH (3) TIMES DAILY AS NEEDED. (Patient taking differently: Take 25 mg by mouth 3 (three) times daily as needed for dizziness. ) 20 tablet 0  . pravastatin (PRAVACHOL) 80 MG tablet TAKE (1) TABLET BY MOUTH AT BEDTIME. (Patient taking differently: Take 80 mg by mouth at bedtime. ) 90 tablet 1  . torsemide (DEMADEX) 20 MG tablet Take 1 tablet twice a day for 2 days then reduce to 1 tablet daily 32 tablet 0  . Travoprost, BAK Free, (TRAVATAN) 0.004 % SOLN ophthalmic solution Place 1 drop into both eyes at bedtime.     Marland Kitchen warfarin (COUMADIN) 5 MG tablet TAKE 1/2 TO 1 TABLET BY MOUTH DAILY OR AS DIRECTED. 30 tablet 4   No current facility-administered medications for this encounter.    Allergies  Allergen Reactions  . Tramadol     Drowsiness    Social History   Socioeconomic History  . Marital status: Married    Spouse name: Not on file  . Number of children: Not on file  . Years of education: Not on file  . Highest education level: Not on file  Occupational History  . Not on file  Tobacco Use  . Smoking status: Never Smoker  . Smokeless tobacco: Never Used  Vaping Use  . Vaping Use: Never used  Substance and Sexual Activity  . Alcohol use: No    Alcohol/week: 0.0 standard drinks  . Drug use: No  . Sexual activity: Not on file  Other Topics Concern  . Not on file  Social History Narrative  . Not on file   Social Determinants of Health   Financial Resource Strain:   . Difficulty of Paying Living Expenses: Not on file  Food Insecurity:   . Worried About Paediatric nurse in the Last Year: Not on file  . Ran Out of Food in the Last Year: Not on file  Transportation Needs:   . Lack of Transportation (Medical): Not on file  . Lack of Transportation (Non-Medical): Not on file  Physical Activity:   . Days of Exercise per Week: Not on file  . Minutes of Exercise per Session: Not on file  Stress:   . Feeling of Stress : Not on file  Social Connections:   . Frequency of Communication with Friends and Family: Not on file  . Frequency of Social Gatherings with Friends and Family: Not on file  . Attends Religious Services: Not on file  .  Active Member of Clubs or Organizations: Not on file  . Attends Archivist Meetings: Not on file  . Marital Status: Not on file  Intimate Partner Violence:   . Fear of Current or Ex-Partner: Not on file  . Emotionally Abused: Not on file  . Physically Abused: Not on file  . Sexually Abused: Not on file    Family History  Problem Relation Age of Onset  . Cancer Other        unknown cancer  . Hypotension Neg Hx   . Anesthesia problems Neg Hx   . Pseudochol deficiency Neg Hx   . Malignant hyperthermia Neg Hx     ROS- All systems are reviewed and negative except as per the HPI above  Physical Exam: Vitals:   04/28/20 1543  BP: (!) 180/54  Pulse: 74  Weight: 79 kg  Height: 5\' 2"  (1.575 m)   Wt Readings from Last 3 Encounters:  04/28/20 79 kg  04/23/20 80.2 kg  04/02/20 76.5 kg    Labs: Lab Results  Component Value Date   NA 140 04/23/2020   K 4.9 04/23/2020   CL 107 04/23/2020   CO2 22 04/23/2020   GLUCOSE 86 04/23/2020   BUN 38 (H) 04/23/2020   CREATININE 2.70 (H) 04/23/2020   CALCIUM 10.3 04/23/2020   PHOS 4.9 (H) 11/03/2008   MG 2.0 11/03/2008   Lab Results  Component Value Date   INR 2.3 04/08/2020   Lab Results  Component Value Date   CHOL 133 08/08/2019   HDL 71 08/08/2019   LDLCALC 45 08/08/2019   TRIG 90 08/08/2019     GEN- The patient is well appearing, alert  and oriented x 3 today.   Head- normocephalic, atraumatic Eyes-  Sclera clear, conjunctiva pink Ears- hearing intact Oropharynx- clear Neck- supple, no JVP Lymph- no cervical lymphadenopathy Lungs- Clear to ausculation bilaterally, normal work of breathing Heart- irregular rate and rhythm, no murmurs, rubs or gallops, PMI not laterally displaced GI- soft, NT, ND, + BS Extremities- no clubbing, cyanosis, or edema MS- no significant deformity or atrophy Skin- no rash or lesion Psych- euthymic mood, full affect Neuro- strength and sensation are intact  EKG-afib at 74 bpm, qrs int 82 ms, qtc 446 ms    Assessment and Plan: 1. Permanent afib She is nicely rate controlled in the office but device clinic noted some elevation of HR's over her usual I feel that afib is not her primary issue as she as lived in [place] for years, well rate controlled No change in approach to afib today  2. Congestive heart failure Her diuretic was increased by PCP a couple of weeks ago for c/o LLE, but unfortunately edema worsened  She had 10 lb weight gain on last visit  She has CKD which I am sure is contributing to edema  She was changed to torsemide 20 mg daily with 40 mg daily for the first 2 days Only 2.5 lb weight loss but feels improved with no orthopnea now, improved LLE, but not back to her baseline bmet pending  Cautioned to be extra careful about salt over the holidays  3. PPM Generator change out performed 10/11  4. CHA2DS2VASc score of 8  Continue  coumadin   F/u with Dr. Wolfgang Phoenix as scheduled   Geroge Baseman. Lada Fulbright, [facility] [address] [place], [place] [address] [phone]

## 2020-05-02 ENCOUNTER — Ambulatory Visit (HOSPITAL_COMMUNITY)
Admission: RE | Admit: 2020-05-02 | Discharge: 2020-05-02 | Disposition: A | Discharge status: Home / Self Care | Payer: Medicare HMO | Source: Ambulatory Visit | Attending: Family Medicine | Admitting: Family Medicine

## 2020-05-02 ENCOUNTER — Other Ambulatory Visit: Payer: Self-pay

## 2020-05-02 DIAGNOSIS — G459 Transient cerebral ischemic attack, unspecified: Secondary | ICD-10-CM | POA: Diagnosis not present

## 2020-05-02 DIAGNOSIS — I10 Essential (primary) hypertension: Secondary | ICD-10-CM | POA: Diagnosis not present

## 2020-05-02 DIAGNOSIS — I672 Cerebral atherosclerosis: Secondary | ICD-10-CM | POA: Diagnosis not present

## 2020-05-02 DIAGNOSIS — E782 Mixed hyperlipidemia: Secondary | ICD-10-CM | POA: Diagnosis not present

## 2020-05-02 DIAGNOSIS — Z8673 Personal history of transient ischemic attack (TIA), and cerebral infarction without residual deficits: Secondary | ICD-10-CM | POA: Diagnosis not present

## 2020-05-02 DIAGNOSIS — R410 Disorientation, unspecified: Secondary | ICD-10-CM | POA: Diagnosis not present

## 2020-05-05 ENCOUNTER — Other Ambulatory Visit: Payer: Self-pay

## 2020-05-05 ENCOUNTER — Encounter: Payer: Self-pay | Admitting: Family Medicine

## 2020-05-05 ENCOUNTER — Ambulatory Visit (INDEPENDENT_AMBULATORY_CARE_PROVIDER_SITE_OTHER): Payer: Medicare HMO | Admitting: Family Medicine

## 2020-05-05 VITALS — BP 136/68 | Ht 63.0 in | Wt 168.0 lb

## 2020-05-05 DIAGNOSIS — I779 Disorder of arteries and arterioles, unspecified: Secondary | ICD-10-CM

## 2020-05-05 DIAGNOSIS — I700 Atherosclerosis of aorta: Secondary | ICD-10-CM

## 2020-05-05 DIAGNOSIS — N1832 Chronic kidney disease, stage 3b: Secondary | ICD-10-CM | POA: Insufficient documentation

## 2020-05-05 DIAGNOSIS — M109 Gout, unspecified: Secondary | ICD-10-CM

## 2020-05-05 MED ORDER — ALLOPURINOL 100 MG PO TABS: ORAL_TABLET | ORAL | 5 refills | Status: DC

## 2020-05-05 NOTE — Progress Notes (Signed)
   Subjective:    Patient ID: [name], female    DOB: Mar 27, 1927, 86 y.o.   MRN: [id number]  HPI  Patient arrives for a follow up on blood pressure and kidney function Recent kidney function reviewed with the patient as well as reviewing the head scan and the carotid Doppler study.  Patient did have previous possible TIA we did a CT scan which is negative for stroke or carotid test does show significant current carotid artery disease.  Unfortunately patient would not be a good CTA candidate because of her kidney function being so severe.  Patient also has chronic kidney disease which was worsened from recent diuretic.  We will be rechecking the kidney function. Review of Systems Denies any passing out spells    Objective:   Physical Exam Blood pressure good lungs clear heart rate is controlled extremities trace edema skin warm dry       Assessment & Plan:  1. Stage 3b chronic kidney disease ([place]) I am concerned about the kidney function here.  Referral was made to nephrology previously and it looks like she has appointment but patient does not recall anything about this and never went to an appointment unfortunately she is becoming more forgetful as she gets older therefore we will redo the consultation and have her daughter take charge of this situation.  Daughter is aware.  We will recheck a metabolic 7 to see if it is improved with the recent change in her Demadex down to just 1 a day - Basic metabolic panel - Ambulatory referral to Nephrology  2. Gout, unspecified cause, unspecified chronicity, unspecified site Reduce the allopurinol to 50 mg twice a week her remaining tablets she can take half a tablet twice a day but then after that it is 50 mg pill 1 pill on Monday 1 pill on Friday. - Uric acid  3. Carotid artery disease, unspecified laterality, unspecified type ([place]) Significant coronary artery disease although I do not feel she is a great surgical candidate I do  believe it would be worthwhile getting an opinion from vascular surgery because she did have a TIA symptomatology better CT scan of the head did not show a stroke she is already on Coumadin and I do not recommend adding aspirin at this point in time - Ambulatory referral to Vascular Surgery  Patient with aortic atherosclerosis seen on CAT scan previously.  Has carotid artery disease.  On pravastatin.  Following cholesterols on a regular basis.

## 2020-05-06 ENCOUNTER — Other Ambulatory Visit: Payer: Self-pay | Admitting: *Deleted

## 2020-05-06 ENCOUNTER — Other Ambulatory Visit: Payer: Self-pay | Admitting: Family Medicine

## 2020-05-06 ENCOUNTER — Ambulatory Visit (INDEPENDENT_AMBULATORY_CARE_PROVIDER_SITE_OTHER): Payer: Medicare HMO | Admitting: *Deleted

## 2020-05-06 DIAGNOSIS — I4891 Unspecified atrial fibrillation: Secondary | ICD-10-CM

## 2020-05-06 DIAGNOSIS — I639 Cerebral infarction, unspecified: Secondary | ICD-10-CM

## 2020-05-06 DIAGNOSIS — Z79899 Other long term (current) drug therapy: Secondary | ICD-10-CM

## 2020-05-06 DIAGNOSIS — Z5181 Encounter for therapeutic drug level monitoring: Secondary | ICD-10-CM | POA: Diagnosis not present

## 2020-05-06 LAB — BASIC METABOLIC PANEL
BUN/Creatinine Ratio: 16 (ref 12–28)
BUN: 45 mg/dL — ABNORMAL HIGH (ref 10–36)
CO2: 25 mmol/L (ref 20–29)
Calcium: 10.3 mg/dL (ref 8.7–10.3)
Chloride: 101 mmol/L (ref 96–106)
Creatinine, Ser: 2.74 mg/dL — ABNORMAL HIGH (ref 0.57–1.00)
GFR calc Af Amer: 17 mL/min/{1.73_m2} — ABNORMAL LOW (ref >59)
GFR calc non Af Amer: 15 mL/min/{1.73_m2} — ABNORMAL LOW (ref >59)
Glucose: 97 mg/dL (ref 65–99)
Potassium: 4.3 mmol/L (ref 3.5–5.2)
Sodium: 139 mmol/L (ref 134–144)

## 2020-05-06 LAB — URIC ACID: Uric Acid: 8.3 mg/dL — ABNORMAL HIGH (ref 3.1–7.9)

## 2020-05-06 LAB — POCT INR: INR: 4.2 — AB (ref 2.0–3.0)

## 2020-05-06 NOTE — Telephone Encounter (Signed)
Seen yesterday

## 2020-05-06 NOTE — Patient Instructions (Signed)
Continue warfarin 1/2 tablet daily except 1 tablet on Mondays and Thursdays  Was changed to torsemide Recheck in 2 weeks

## 2020-05-08 ENCOUNTER — Telehealth: Payer: Self-pay | Admitting: Family Medicine

## 2020-05-14 ENCOUNTER — Other Ambulatory Visit: Payer: Self-pay | Admitting: Nephrology

## 2020-05-14 DIAGNOSIS — D638 Anemia in other chronic diseases classified elsewhere: Secondary | ICD-10-CM | POA: Diagnosis not present

## 2020-05-14 DIAGNOSIS — I129 Hypertensive chronic kidney disease with stage 1 through stage 4 chronic kidney disease, or unspecified chronic kidney disease: Secondary | ICD-10-CM | POA: Diagnosis not present

## 2020-05-14 DIAGNOSIS — I517 Cardiomegaly: Secondary | ICD-10-CM | POA: Diagnosis not present

## 2020-05-14 DIAGNOSIS — N170 Acute kidney failure with tubular necrosis: Secondary | ICD-10-CM | POA: Diagnosis not present

## 2020-05-14 DIAGNOSIS — Z79899 Other long term (current) drug therapy: Secondary | ICD-10-CM | POA: Diagnosis not present

## 2020-05-14 DIAGNOSIS — I050 Rheumatic mitral stenosis: Secondary | ICD-10-CM | POA: Diagnosis not present

## 2020-05-14 DIAGNOSIS — N184 Chronic kidney disease, stage 4 (severe): Secondary | ICD-10-CM | POA: Diagnosis not present

## 2020-05-16 DIAGNOSIS — D638 Anemia in other chronic diseases classified elsewhere: Secondary | ICD-10-CM | POA: Diagnosis not present

## 2020-05-16 DIAGNOSIS — N184 Chronic kidney disease, stage 4 (severe): Secondary | ICD-10-CM | POA: Diagnosis not present

## 2020-05-16 DIAGNOSIS — N170 Acute kidney failure with tubular necrosis: Secondary | ICD-10-CM | POA: Diagnosis not present

## 2020-05-16 DIAGNOSIS — I517 Cardiomegaly: Secondary | ICD-10-CM | POA: Diagnosis not present

## 2020-05-16 DIAGNOSIS — Z79899 Other long term (current) drug therapy: Secondary | ICD-10-CM | POA: Diagnosis not present

## 2020-05-16 DIAGNOSIS — I129 Hypertensive chronic kidney disease with stage 1 through stage 4 chronic kidney disease, or unspecified chronic kidney disease: Secondary | ICD-10-CM | POA: Diagnosis not present

## 2020-05-16 DIAGNOSIS — I050 Rheumatic mitral stenosis: Secondary | ICD-10-CM | POA: Diagnosis not present

## 2020-05-21 ENCOUNTER — Other Ambulatory Visit (HOSPITAL_COMMUNITY): Payer: Self-pay | Admitting: Nurse Practitioner

## 2020-05-21 ENCOUNTER — Other Ambulatory Visit: Payer: Self-pay | Admitting: Family Medicine

## 2020-05-21 ENCOUNTER — Ambulatory Visit (INDEPENDENT_AMBULATORY_CARE_PROVIDER_SITE_OTHER): Payer: Medicare HMO | Admitting: *Deleted

## 2020-05-21 DIAGNOSIS — Z5181 Encounter for therapeutic drug level monitoring: Secondary | ICD-10-CM

## 2020-05-21 DIAGNOSIS — I639 Cerebral infarction, unspecified: Secondary | ICD-10-CM

## 2020-05-21 DIAGNOSIS — I4891 Unspecified atrial fibrillation: Secondary | ICD-10-CM

## 2020-05-21 LAB — POCT INR: INR: 4 — AB (ref 2.0–3.0)

## 2020-05-21 NOTE — Patient Instructions (Signed)
Hold warfarin tonight then decrease dose to 1/2 tablet daily Was changed to torsemide Recheck in 2 weeks

## 2020-05-22 ENCOUNTER — Other Ambulatory Visit: Payer: Self-pay

## 2020-05-22 ENCOUNTER — Ambulatory Visit (HOSPITAL_COMMUNITY)
Admission: RE | Admit: 2020-05-22 | Discharge: 2020-05-22 | Disposition: A | Discharge status: Home / Self Care | Payer: Medicare HMO | Source: Ambulatory Visit | Attending: Nephrology | Admitting: Nephrology

## 2020-05-22 DIAGNOSIS — N179 Acute kidney failure, unspecified: Secondary | ICD-10-CM | POA: Diagnosis not present

## 2020-05-22 DIAGNOSIS — N281 Cyst of kidney, acquired: Secondary | ICD-10-CM | POA: Diagnosis not present

## 2020-05-22 DIAGNOSIS — N170 Acute kidney failure with tubular necrosis: Secondary | ICD-10-CM | POA: Diagnosis not present

## 2020-06-03 ENCOUNTER — Other Ambulatory Visit: Payer: Self-pay

## 2020-06-03 ENCOUNTER — Ambulatory Visit (INDEPENDENT_AMBULATORY_CARE_PROVIDER_SITE_OTHER): Payer: Medicare HMO | Admitting: *Deleted

## 2020-06-03 DIAGNOSIS — Z5181 Encounter for therapeutic drug level monitoring: Secondary | ICD-10-CM | POA: Diagnosis not present

## 2020-06-03 DIAGNOSIS — I639 Cerebral infarction, unspecified: Secondary | ICD-10-CM

## 2020-06-03 DIAGNOSIS — I4891 Unspecified atrial fibrillation: Secondary | ICD-10-CM | POA: Diagnosis not present

## 2020-06-03 LAB — POCT INR: INR: 2.8 (ref 2.0–3.0)

## 2020-06-03 NOTE — Patient Instructions (Signed)
Continue warfarin 1/2 tablet daily Was changed to torsemide Recheck in 4 weeks

## 2020-06-04 DIAGNOSIS — N184 Chronic kidney disease, stage 4 (severe): Secondary | ICD-10-CM | POA: Diagnosis not present

## 2020-06-04 DIAGNOSIS — E211 Secondary hyperparathyroidism, not elsewhere classified: Secondary | ICD-10-CM | POA: Diagnosis not present

## 2020-06-04 DIAGNOSIS — D638 Anemia in other chronic diseases classified elsewhere: Secondary | ICD-10-CM | POA: Diagnosis not present

## 2020-06-04 DIAGNOSIS — R768 Other specified abnormal immunological findings in serum: Secondary | ICD-10-CM | POA: Diagnosis not present

## 2020-06-04 DIAGNOSIS — N270 Small kidney, unilateral: Secondary | ICD-10-CM | POA: Diagnosis not present

## 2020-06-04 DIAGNOSIS — D508 Other iron deficiency anemias: Secondary | ICD-10-CM | POA: Diagnosis not present

## 2020-06-04 DIAGNOSIS — I129 Hypertensive chronic kidney disease with stage 1 through stage 4 chronic kidney disease, or unspecified chronic kidney disease: Secondary | ICD-10-CM | POA: Diagnosis not present

## 2020-06-04 DIAGNOSIS — E559 Vitamin D deficiency, unspecified: Secondary | ICD-10-CM | POA: Diagnosis not present

## 2020-06-04 DIAGNOSIS — R600 Localized edema: Secondary | ICD-10-CM | POA: Diagnosis not present

## 2020-06-05 ENCOUNTER — Telehealth: Payer: Self-pay | Admitting: Internal Medicine

## 2020-06-05 ENCOUNTER — Ambulatory Visit (HOSPITAL_COMMUNITY)
Admission: RE | Admit: 2020-06-05 | Discharge: 2020-06-05 | Disposition: A | Discharge status: Home / Self Care | Payer: Medicare HMO | Source: Ambulatory Visit | Attending: Nephrology | Admitting: Nephrology

## 2020-06-05 ENCOUNTER — Other Ambulatory Visit (HOSPITAL_COMMUNITY): Payer: Self-pay | Admitting: Nephrology

## 2020-06-05 ENCOUNTER — Other Ambulatory Visit: Payer: Self-pay

## 2020-06-05 DIAGNOSIS — J811 Chronic pulmonary edema: Secondary | ICD-10-CM | POA: Diagnosis not present

## 2020-06-05 DIAGNOSIS — I10 Essential (primary) hypertension: Secondary | ICD-10-CM | POA: Diagnosis not present

## 2020-06-05 DIAGNOSIS — I4891 Unspecified atrial fibrillation: Secondary | ICD-10-CM | POA: Diagnosis not present

## 2020-06-05 DIAGNOSIS — N184 Chronic kidney disease, stage 4 (severe): Secondary | ICD-10-CM | POA: Diagnosis not present

## 2020-06-05 DIAGNOSIS — J90 Pleural effusion, not elsewhere classified: Secondary | ICD-10-CM | POA: Diagnosis not present

## 2020-06-05 DIAGNOSIS — R0602 Shortness of breath: Secondary | ICD-10-CM | POA: Diagnosis not present

## 2020-06-05 DIAGNOSIS — N189 Chronic kidney disease, unspecified: Secondary | ICD-10-CM | POA: Diagnosis not present

## 2020-06-05 NOTE — Telephone Encounter (Signed)
Daughter called back.  Patient placed on iron supplement and vitamin D.  Advised daughter there should be no interactions with her warfarin.  Daughter voiced understanding.

## 2020-06-05 NOTE — Telephone Encounter (Signed)
One of the patients added vitamins to her medication and daughter is concerned it may interfere with her coumadin. Please advise.

## 2020-06-05 NOTE — Telephone Encounter (Signed)
Left message on machine.

## 2020-06-16 ENCOUNTER — Encounter: Payer: Medicare HMO | Admitting: Vascular Surgery

## 2020-06-17 ENCOUNTER — Ambulatory Visit: Payer: Medicare HMO

## 2020-06-17 DIAGNOSIS — I495 Sick sinus syndrome: Secondary | ICD-10-CM

## 2020-06-17 LAB — CUP PACEART REMOTE DEVICE CHECK
Battery Remaining Longevity: 130 mo
Battery Remaining Percentage: 95.5 %
Battery Voltage: 3.04 V
Brady Statistic RV Percent Paced: 28 %
Date Time Interrogation Session: 20220111071727
Implantable Lead Implant Date: 20100602
Implantable Lead Implant Date: 20100602
Implantable Lead Location: 753859
Implantable Lead Location: 753860
Implantable Pulse Generator Implant Date: 20211011
Lead Channel Impedance Value: 390 Ohm
Lead Channel Pacing Threshold Amplitude: 0.75 V
Lead Channel Pacing Threshold Pulse Width: 0.4 ms
Lead Channel Sensing Intrinsic Amplitude: 12 mV
Lead Channel Setting Pacing Amplitude: 2.5 V
Lead Channel Setting Pacing Pulse Width: 0.4 ms
Lead Channel Setting Sensing Sensitivity: 2 mV
Pulse Gen Model: 2272
Pulse Gen Serial Number: 6220844

## 2020-06-18 ENCOUNTER — Encounter (INDEPENDENT_AMBULATORY_CARE_PROVIDER_SITE_OTHER): Payer: Medicare HMO | Admitting: Ophthalmology

## 2020-06-18 ENCOUNTER — Other Ambulatory Visit: Payer: Self-pay

## 2020-06-18 DIAGNOSIS — I10 Essential (primary) hypertension: Secondary | ICD-10-CM

## 2020-06-18 DIAGNOSIS — H31001 Unspecified chorioretinal scars, right eye: Secondary | ICD-10-CM

## 2020-06-18 DIAGNOSIS — H35031 Hypertensive retinopathy, right eye: Secondary | ICD-10-CM

## 2020-06-20 ENCOUNTER — Encounter: Payer: Self-pay | Admitting: Internal Medicine

## 2020-06-20 ENCOUNTER — Ambulatory Visit: Payer: Medicare HMO | Admitting: Internal Medicine

## 2020-06-20 ENCOUNTER — Other Ambulatory Visit: Payer: Self-pay

## 2020-06-20 ENCOUNTER — Other Ambulatory Visit: Payer: Self-pay | Admitting: Family Medicine

## 2020-06-20 VITALS — BP 152/48 | HR 74 | Ht 63.0 in | Wt 174.8 lb

## 2020-06-20 DIAGNOSIS — I495 Sick sinus syndrome: Secondary | ICD-10-CM | POA: Diagnosis not present

## 2020-06-20 DIAGNOSIS — I4819 Other persistent atrial fibrillation: Secondary | ICD-10-CM

## 2020-06-20 DIAGNOSIS — Z950 Presence of cardiac pacemaker: Secondary | ICD-10-CM

## 2020-06-20 NOTE — Progress Notes (Signed)
Patient Care Team: Kathyrn Drown, MD as PCP - General (Family Medicine) Evans Lance, MD as PCP - Electrophysiology (Cardiology) Carole Civil, MD (Orthopedic Surgery) Evans Lance, MD (Cardiology)   HPI  [name] is a 87 y.o. female seen in follow-up for [facility] pacemaker implanted 2010 intercurrently followed by Dr. Elliot Cousin and underwent generator replacement 10/21 she has Atrial fibrillation, presumed permanent and is anticoagulated with warfarin  Worsening of heart failure prompted the use of torsemide.  Recently increased by nephrology.  There has been concern about her renal function.  But her edema is better and her added salt intake has been eliminated; exertional dyspnea is less and nocturnal dyspnea is improved.  Ambulates with some dyspnea, mild edema but no chest pain   Date Cr K Hgb  3/21 2.19 4.8 10.7   11.21 2.74<<3.3 4.3    DATE TEST EF   3/21 Echo   60-65 % LAE severe          Thromboembolic risk factors ( age  -2, HTN-1, TIA/CVA-2, CHF-1, Gender-1) for a CHADSVASc Score of>= 7   Records and Results Reviewed   Past Medical History:  Diagnosis Date  . Anemia    H/H of 11.3/36.7 in 12/2008 ;normal MCV; normal CBC in 2011  . Angioedema 08/2006  . Atrial fibrillation (HCC)    persistant  . 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   Ultrasound 12/29/2018 demonstrates cyst no further work-up recommended by criteria  . Blood transfusion   . 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 Chronic bronchitis   . Chronic renal insufficiency    baseline creatine 1.4; 1.04 in 03/2010  . DJD (degenerative joint disease)    hands, knees  . Gout   . Hyperlipidemia   . Hypertension    heart disease diastolic dysfunction ; UL-84%; mild pulmonary edema  in 2006;responded to diurectics ; LVH  . Kidney stones   . Mitral valve disease    Severe mitral annular calcification; mild to moderate stenosis-not clearly rheumatic  . Nephrolithiasis   . Pacemaker   . Retinal hemorrhage   . Seizure disorder ([place])   . Shortness of breath    only with exertion  . Stroke ([place])   . Tachycardia-bradycardia syndrome ([place])    with pauses and syncope;PPM implantation 10/2008,negative stress nuclear study 03/2005  . Vertigo     Past Surgical History:  Procedure Laterality Date  . CATARACT EXTRACTION W/PHACO  03/29/2011   Procedure: CATARACT EXTRACTION PHACO AND INTRAOCULAR LENS PLACEMENT (IOC);  Surgeon: Tonny Branch;  [facility];  Service: Ophthalmology;  Laterality: Right;  CDE 12.62  . INSERT / REPLACE / REMOVE PACEMAKER  11/2008   St.Jude DUAL chamber pacemaker 11/06/2008  . MEMBRANE PEEL Right 02/12/2014   Procedure: MEMBRANE PEEL;  Surgeon: [name], MD;  Location: [place];  Service: Ophthalmology;  Laterality: Right;  . PARS PLANA VITRECTOMY Right 02/12/2014   Procedure: PARS PLANA VITRECTOMY WITH 25 GAUGE;  Surgeon: [name], MD;  Location: [place];  Service: Ophthalmology;  Laterality: Right;  . PPM GENERATOR CHANGEOUT N/A 03/17/2020   Procedure: PPM GENERATOR CHANGEOUT;  Surgeon: Deboraha Sprang, MD;  Location: [place] CV LAB;  Service: Cardiovascular;  Laterality: N/A;  . TOTAL KNEE ARTHROPLASTY  04/2009  Right   Current Outpatient Medications on File Prior to Visit  Medication Sig Dispense Refill  . albuterol (VENTOLIN HFA) 108 (90 Base) MCG/ACT inhaler TAKE 2 PUFFS EVERY 6 HOURS AS NEEDED 18 g 5  . allopurinol (ZYLOPRIM) 100 MG tablet TAKE 1/2 TABLET BY MOUTH ON MONDAY AND FRIDAY 4 tablet 5  . amLODipine (NORVASC) 5 MG tablet Take 1 tablet (5 mg total) by mouth daily. 30 tablet 4  . ferrous sulfate 324 (65 Fe) MG TBEC Take 1 tablet by mouth daily.    . meclizine (ANTIVERT) 25 MG tablet Take 1 tablet (25 mg total) by mouth 2  (two) times daily as needed for dizziness. 20 tablet 0  . pravastatin (PRAVACHOL) 80 MG tablet TAKE (1) TABLET BY MOUTH AT BEDTIME. 90 tablet 0  . torsemide (DEMADEX) 20 MG tablet Take 1 tablet (20 mg total) by mouth daily. TAKE 1 TABLET TWICE DAILY FOR 2 DAYS, THEN TAKE 1 TABLET DAILY. 30 tablet 3  . Travoprost, BAK Free, (TRAVATAN) 0.004 % SOLN ophthalmic solution Place 1 drop into both eyes at bedtime.     Marland Kitchen VITAMIN D, CHOLECALCIFEROL, PO Take 1 tablet by mouth every morning.    . warfarin (COUMADIN) 5 MG tablet TAKE 1/2 TO 1 TABLET BY MOUTH DAILY OR AS DIRECTED. 30 tablet 4   No current facility-administered medications on file prior to visit.     Current Meds  Medication Sig  . albuterol (VENTOLIN HFA) 108 (90 Base) MCG/ACT inhaler TAKE 2 PUFFS EVERY 6 HOURS AS NEEDED  . allopurinol (ZYLOPRIM) 100 MG tablet TAKE 1/2 TABLET BY MOUTH ON MONDAY AND FRIDAY  . amLODipine (NORVASC) 5 MG tablet Take 1 tablet (5 mg total) by mouth daily.  Marland Kitchen diltiazem (TIAZAC) 360 MG 24 hr capsule TAKE ONE CAPSULE BY MOUTH ONCE DAILY.  . ferrous sulfate 324 (65 Fe) MG TBEC Take 1 tablet by mouth daily.  . meclizine (ANTIVERT) 25 MG tablet Take 1 tablet (25 mg total) by mouth 2 (two) times daily as needed for dizziness.  . pravastatin (PRAVACHOL) 80 MG tablet TAKE (1) TABLET BY MOUTH AT BEDTIME.  Marland Kitchen torsemide (DEMADEX) 20 MG tablet Take 1 tablet (20 mg total) by mouth daily. TAKE 1 TABLET TWICE DAILY FOR 2 DAYS, THEN TAKE 1 TABLET DAILY.  . Travoprost, BAK Free, (TRAVATAN) 0.004 % SOLN ophthalmic solution Place 1 drop into both eyes at bedtime.   Marland Kitchen VITAMIN D, CHOLECALCIFEROL, PO Take 1 tablet by mouth every morning.  . warfarin (COUMADIN) 5 MG tablet TAKE 1/2 TO 1 TABLET BY MOUTH DAILY OR AS DIRECTED.    Allergies  Allergen Reactions  . Tramadol     Drowsiness      Review of Systems negative except from HPI and PMH  Physical Exam BP (!) 152/48   Pulse 74   Ht 5\' 3"  (1.6 m)   Wt 174 lb 12.8 oz (79.3  kg)   SpO2 (!) 89%   BMI 30.96 kg/m  Well developed and nourished in no acute distress HENT normal Neck supple with JVP 8 Carotids brisk and full without bruits Clear Irregularly irregular rate and rhythm with controlled * ventricular response, 2/6 murmur Abd-soft with active BS without hepatomegaly No Clubbing cyanosis 2+ edema Skin-warm and dry A & Oriented  Grossly normal sensory and motor function   ECG atrial fibrillation at 74  CrCl cannot be calculated (Patient's most recent lab result is older than the maximum 21 days allowed.).   Assessment and  Plan  Bradycardia  Atrial fib-long standing persistent  Pacemaker [employer] Jude   Renal insufficiency grade 4  HFpEF chronic   Device function is normal.  She has healed well.   Her heart failure and her renal issues are competing.  Right now she is better with increased diuretics.  We will check her renal function.  Hopefully it will have deteriorated significantly.  She has decreased her sodium intake in her diet which I think is probably helping a lot.  She has made a decision that she does not want dialysis.  Reasonable.  On Anticoagulation;  No bleeding issues   At this point her renal function is not so bad that it would prompt the discontinuation of anticoagulation although that is probably coming   Current medicines are reviewed at length with the patient today .  The patient does not  have concerns regarding medicines.

## 2020-06-20 NOTE — Patient Instructions (Signed)
Medication Instructions:  Your physician recommends that you continue on your current medications as directed. Please refer to the Current Medication list given to you today.  *If you need a refill on your cardiac medications before your next appointment, please call your pharmacy*   Lab Work: BMET today If you have labs (blood work) drawn today and your tests are completely normal, you will receive your results only by: Marland Kitchen MyChart Message (if you have MyChart) OR . A paper copy in the mail If you have any lab test that is abnormal or we need to change your treatment, we will call you to review the results.   Testing/Procedures: None ordered.    Follow-Up: At [facility], you and your health needs are our priority.  As part of our continuing mission to provide you with exceptional heart care, we have created designated Provider Care Teams.  These Care Teams include your primary Cardiologist (physician) and Advanced Practice Providers (APPs -  Physician Assistants and Nurse Practitioners) who all work together to provide you with the care you need, when you need it.  We recommend signing up for the patient portal called "MyChart".  Sign up information is provided on this After Visit Summary.  MyChart is used to connect with patients for Virtual Visits (Telemedicine).  Patients are able to view lab/test results, encounter notes, upcoming appointments, etc.  Non-urgent messages can be sent to your provider as well.   To learn more about what you can do with MyChart, go to [email or web address].    Your next appointment:   12 month(s)  The format for your next appointment:   In Person  Provider:   Virl Axe, MD

## 2020-06-21 LAB — BASIC METABOLIC PANEL
BUN/Creatinine Ratio: 17 (ref 12–28)
BUN: 39 mg/dL — ABNORMAL HIGH (ref 10–36)
CO2: 25 mmol/L (ref 20–29)
Calcium: 9.6 mg/dL (ref 8.7–10.3)
Chloride: 102 mmol/L (ref 96–106)
Creatinine, Ser: 2.36 mg/dL — ABNORMAL HIGH (ref 0.57–1.00)
GFR calc Af Amer: 20 mL/min/{1.73_m2} — ABNORMAL LOW (ref >59)
GFR calc non Af Amer: 17 mL/min/{1.73_m2} — ABNORMAL LOW (ref >59)
Glucose: 104 mg/dL — ABNORMAL HIGH (ref 65–99)
Potassium: 4.4 mmol/L (ref 3.5–5.2)
Sodium: 141 mmol/L (ref 134–144)

## 2020-06-24 LAB — CUP PACEART INCLINIC DEVICE CHECK
Battery Remaining Longevity: 135 mo
Battery Voltage: 3.04 V
Brady Statistic RA Percent Paced: 0 %
Brady Statistic RV Percent Paced: 27 %
Date Time Interrogation Session: 20220114152900
Implantable Lead Implant Date: 20100602
Implantable Lead Implant Date: 20100602
Implantable Lead Location: 753859
Implantable Lead Location: 753860
Implantable Pulse Generator Implant Date: 20211011
Lead Channel Impedance Value: 400 Ohm
Lead Channel Pacing Threshold Amplitude: 1 V
Lead Channel Pacing Threshold Pulse Width: 0.4 ms
Lead Channel Sensing Intrinsic Amplitude: 12 mV
Lead Channel Setting Pacing Amplitude: 2.5 V
Lead Channel Setting Pacing Pulse Width: 0.4 ms
Lead Channel Setting Sensing Sensitivity: 2 mV
Pulse Gen Model: 2272
Pulse Gen Serial Number: 6220844

## 2020-07-01 ENCOUNTER — Ambulatory Visit (INDEPENDENT_AMBULATORY_CARE_PROVIDER_SITE_OTHER): Payer: Medicare HMO | Admitting: *Deleted

## 2020-07-01 ENCOUNTER — Other Ambulatory Visit: Payer: Self-pay

## 2020-07-01 DIAGNOSIS — I639 Cerebral infarction, unspecified: Secondary | ICD-10-CM | POA: Diagnosis not present

## 2020-07-01 DIAGNOSIS — I4891 Unspecified atrial fibrillation: Secondary | ICD-10-CM | POA: Diagnosis not present

## 2020-07-01 DIAGNOSIS — Z5181 Encounter for therapeutic drug level monitoring: Secondary | ICD-10-CM | POA: Diagnosis not present

## 2020-07-01 DIAGNOSIS — D638 Anemia in other chronic diseases classified elsewhere: Secondary | ICD-10-CM | POA: Diagnosis not present

## 2020-07-01 DIAGNOSIS — E211 Secondary hyperparathyroidism, not elsewhere classified: Secondary | ICD-10-CM | POA: Diagnosis not present

## 2020-07-01 DIAGNOSIS — N270 Small kidney, unilateral: Secondary | ICD-10-CM | POA: Diagnosis not present

## 2020-07-01 DIAGNOSIS — I10 Essential (primary) hypertension: Secondary | ICD-10-CM | POA: Diagnosis not present

## 2020-07-01 DIAGNOSIS — D508 Other iron deficiency anemias: Secondary | ICD-10-CM | POA: Diagnosis not present

## 2020-07-01 DIAGNOSIS — N184 Chronic kidney disease, stage 4 (severe): Secondary | ICD-10-CM | POA: Diagnosis not present

## 2020-07-01 DIAGNOSIS — I129 Hypertensive chronic kidney disease with stage 1 through stage 4 chronic kidney disease, or unspecified chronic kidney disease: Secondary | ICD-10-CM | POA: Diagnosis not present

## 2020-07-01 DIAGNOSIS — R768 Other specified abnormal immunological findings in serum: Secondary | ICD-10-CM | POA: Diagnosis not present

## 2020-07-01 LAB — POCT INR: INR: 1.9 — AB (ref 2.0–3.0)

## 2020-07-01 NOTE — Patient Instructions (Signed)
Take warfarin 1 tablet tonight then resume 1/2 tablet daily Was changed to torsemide Recheck in 4 weeks

## 2020-07-02 DIAGNOSIS — D638 Anemia in other chronic diseases classified elsewhere: Secondary | ICD-10-CM | POA: Diagnosis not present

## 2020-07-02 DIAGNOSIS — E212 Other hyperparathyroidism: Secondary | ICD-10-CM | POA: Diagnosis not present

## 2020-07-02 DIAGNOSIS — N184 Chronic kidney disease, stage 4 (severe): Secondary | ICD-10-CM | POA: Diagnosis not present

## 2020-07-02 DIAGNOSIS — E559 Vitamin D deficiency, unspecified: Secondary | ICD-10-CM | POA: Diagnosis not present

## 2020-07-02 DIAGNOSIS — I129 Hypertensive chronic kidney disease with stage 1 through stage 4 chronic kidney disease, or unspecified chronic kidney disease: Secondary | ICD-10-CM | POA: Diagnosis not present

## 2020-07-03 NOTE — Progress Notes (Signed)
Remote pacemaker transmission.   

## 2020-07-07 ENCOUNTER — Ambulatory Visit: Payer: Medicare HMO | Admitting: Family Medicine

## 2020-07-07 ENCOUNTER — Encounter: Payer: Self-pay | Admitting: Family Medicine

## 2020-07-08 ENCOUNTER — Ambulatory Visit: Payer: Medicare HMO | Admitting: Family Medicine

## 2020-07-08 ENCOUNTER — Other Ambulatory Visit: Payer: Self-pay

## 2020-07-10 ENCOUNTER — Other Ambulatory Visit: Payer: Self-pay

## 2020-07-10 ENCOUNTER — Ambulatory Visit (INDEPENDENT_AMBULATORY_CARE_PROVIDER_SITE_OTHER): Payer: Medicare HMO | Admitting: Family Medicine

## 2020-07-10 ENCOUNTER — Encounter: Payer: Self-pay | Admitting: Family Medicine

## 2020-07-10 VITALS — BP 148/70 | HR 89 | Temp 97.2°F | Ht 63.0 in

## 2020-07-10 DIAGNOSIS — L989 Disorder of the skin and subcutaneous tissue, unspecified: Secondary | ICD-10-CM

## 2020-07-10 DIAGNOSIS — R0602 Shortness of breath: Secondary | ICD-10-CM | POA: Diagnosis not present

## 2020-07-10 DIAGNOSIS — T148XXA Other injury of unspecified body region, initial encounter: Secondary | ICD-10-CM

## 2020-07-10 MED ORDER — CEPHALEXIN 500 MG PO CAPS: 500.0000 mg | ORAL_CAPSULE | Freq: Two times a day (BID) | ORAL | 0 refills | Status: DC

## 2020-07-10 NOTE — Progress Notes (Signed)
Patient ID: [name] White, female    DOB: [birth year], 87 y.o.   MRN: [id number]   No chief complaint on file.  Subjective:  CC: blister on leg leg popped  This is a new problem.  Presents today for an acute visit with a complaint of blister on left leg popped today.  Reports that she had put some ointment on the area on her left leg and it caused the area to blister.  Reports the area was quite large.  Clear fluid noted on area, no erythema no purulence.  Denies fever, chills, chest pain, however, reports that she has not taken her fluid pill today and slightly short of breath with walking.  Husband in room with patient.  Blister on leg. Came up 2 days ago. Pt did pop it 2 nights ago.   Sob of this morning.    Medical History [name] has a past medical history of Anemia, Angioedema (08/2006), Atrial fibrillation ([name] White), Benign thyroid cyst (12/30/2018), Blood transfusion, Cerebrovascular accident ([name] White) (2006), Chronic bronchitis, Chronic renal insufficiency, DJD (degenerative joint disease), Gout, Hyperlipidemia, Hypertension, Kidney stones, Mitral valve disease, Nephrolithiasis, Pacemaker, Retinal hemorrhage, Seizure disorder ([name] White), Shortness of breath, Stroke ([name]), Tachycardia-bradycardia syndrome ([name] White), and Vertigo.   Outpatient Encounter Medications as of 07/10/2020  Medication Sig  . albuterol (VENTOLIN HFA) 108 (90 Base) MCG/ACT inhaler TAKE 2 PUFFS EVERY 6 HOURS AS NEEDED  . allopurinol (ZYLOPRIM) 100 MG tablet TAKE 1/2 TABLET BY MOUTH ON MONDAY AND FRIDAY  . amLODipine (NORVASC) 5 MG tablet Take 1 tablet (5 mg total) by mouth daily.  . cephALEXin (KEFLEX) 500 MG capsule Take 1 capsule (500 mg total) by mouth 2 (two) times daily.  Marland Kitchen diltiazem (TIAZAC) 360 MG 24 hr capsule TAKE ONE CAPSULE BY MOUTH ONCE DAILY.  . ferrous sulfate 324 (65 Fe) MG TBEC Take 1 tablet by mouth daily.  . meclizine (ANTIVERT) 25 MG tablet Take 1 tablet (25 mg total) by mouth 2 (two) times daily as needed for  dizziness.  . pravastatin (PRAVACHOL) 80 MG tablet TAKE (1) TABLET BY MOUTH AT BEDTIME.  Marland Kitchen torsemide (DEMADEX) 20 MG tablet Take 1 tablet (20 mg total) by mouth daily. TAKE 1 TABLET TWICE DAILY FOR 2 DAYS, THEN TAKE 1 TABLET DAILY.  . Travoprost, BAK Free, (TRAVATAN) 0.004 % SOLN ophthalmic solution Place 1 drop into both eyes at bedtime.   Marland Kitchen VITAMIN D, CHOLECALCIFEROL, PO Take 1 tablet by mouth every morning.  . warfarin (COUMADIN) 5 MG tablet TAKE 1/2 TO 1 TABLET BY MOUTH DAILY OR AS DIRECTED.   No facility-administered encounter medications on file as of 07/10/2020.     Review of Systems  Constitutional: Negative for chills and fever.  Respiratory: Positive for shortness of breath.        Able to converse,  lungs clear. Has not taken fluid pill today.   Cardiovascular: Negative for chest pain.     Vitals BP (!) 148/70   Pulse 89   Temp (!) 97.2 F (36.2 C)   Ht '5\' 3"'$  (1.6 m)   SpO2 97%   BMI 30.96 kg/m   Objective:   Physical Exam Nursing note reviewed.  Constitutional:      General: She is not in acute distress.    Appearance: Normal appearance.  Cardiovascular:     Rate and Rhythm: Regular rhythm.  Pulmonary:     Effort: Pulmonary effort is normal.     Breath sounds: Normal breath sounds. No rales.  Skin:  General: Skin is warm and dry.     Findings: Lesion present. No erythema.     Comments: Left lower extremity with blister with clear fluid draining.  No erythema, no purulence.  Reports she popped the blister herself this morning.  Neurological:     Mental Status: She is alert.      Assessment and Plan   1. Blister of skin - cephALEXin (KEFLEX) 500 MG capsule; Take 1 capsule (500 mg total) by mouth 2 (two) times daily.  Dispense: 20 capsule; Refill: 0   Due to advanced age and comorbidities, will treat area with antibiotic to prevent infection.  Will keep area clean and dry and protected.  No erythema, no purulence noted today.  Recommend avoiding the  ointment that caused the original problem.  She will take her diuretic upon returning home, oxygen saturation 97% today, lungs clear.  Agrees with plan of care discussed today. Understands warning signs to seek further care: chest pain, shortness of breath, any significant change in health.  Understands to follow-up in 2 weeks for assessment, to ensure complete resolution.  [name] Ades, NP 07/10/2020

## 2020-07-10 NOTE — Patient Instructions (Addendum)
Keep area clean and dry. Take antibiotic     Blisters, Adult  A blister is a raised bubble of skin filled with liquid. Blisters often develop on skin that rubs or presses against another surface repeatedly (friction blister). Friction blisters can occur on any part of the body, but they usually form on the hands or the feet. Long-term pressure on that same area of skin can also cause the area of skin to become hardened. This hardened skin is called a callus. What are the causes? Besides friction, blisters can be caused by:  An injury, such as a burn.  An allergic reaction.  An infection.  Exposure to irritating chemicals. Friction blisters often occur in areas with a lot of heat and moisture. Friction blisters often result from:  Sports.  Repetitive activities.  Using tools and doing other activities without wearing gloves.  Shoes that are too tight or too loose. What are the signs or symptoms? A blister is often round and looks like a bump. It may hurt or feel itchy. Before a blister forms, your skin may:  Turn red.  Feel warm.  Itch.  Be painful to the touch. How is this diagnosed? A blister is diagnosed with a physical exam. How is this treated? Treatment usually involves protecting the area where the blister has formed until your skin has healed. Treatments may include:  Using a bandage (dressing) to cover your blister.  Putting extra padding around and over your blister so that the blister does not rub on anything.  Applying antibiotic ointment. Most blisters break open, dry up, and go away on their own within 1-2 weeks. Blisters that are very painful may be drained before they break open on their own. If your blister is large or painful, your health care provider can drain it. Follow these instructions at home: Medicines  Take or apply over-the-counter and prescription medicines only as told by your health care provider.  If you were prescribed an  antibiotic medicine, take or apply it as told by your health care provider. Do not stop using the antibiotic even if you start to feel better. Skin care  Do not pop your blister. This can cause infection.  Keep your blister clean and dry. This helps to prevent infection.  Before you swim or use a hot tub, cover your blister with a waterproof dressing.  Protect the area where your blister has formed as told by your health care provider.  Follow instructions from your health care provider about how to take care of your blister. Make sure you: ? Wash your hands with soap and water for at least 20 seconds before and after you change your dressing. If soap and water are not available, use hand sanitizer. ? Change your dressing as told by your health care provider. Infection signs Check your blister every day for signs of infection. Check for:  More redness, swelling, or pain.  More fluid or blood.  Warmth.  Pus or a bad smell. General instructions  If you have a blister on a foot or toe, wear different shoes until your blister heals.  Avoid the activity that caused the blister until your blister heals.  Keep all follow-up visits as told by your health care provider. This is important. How is this prevented? Taking these steps can help to prevent blisters that are caused by friction. Make sure you:  Wear comfortable shoes that fit well.  Always wear socks with shoes.  Wear extra socks or use tape,  dressings, or pads over blister-prone areas as needed. You may also apply petroleum jelly under dressings in blister-prone areas.  Wear protective gear, such as gloves, when taking part in sports or activities that can cause blisters.  Wear loose-fitting, moisture-wicking clothes when taking part in sports or activities.  Use powders as needed to keep your feet dry. Contact a health care provider if:  You have more redness, swelling, or pain around your blister.  You have more  fluid or blood coming from your blister.  Your blister feels warm to the touch.  You have pus or a bad smell coming from your blister.  You have a fever or chills.  Your blister gets better and then it gets worse. Summary  A blister is a raised bubble of skin filled with liquid. Blisters often develop on skin that rubs or presses against another surface repeatedly (friction blister).  Most blisters break open, dry up, and go away on their own within 1-2 weeks.  Keep your blister clean and dry. This helps to prevent infection.  Take steps to help prevent blisters that are caused by friction.  Contact a health care provider if you have signs of infection. This information is not intended to replace advice given to you by your health care provider. Make sure you discuss any questions you have with your health care provider. Document Revised: 04/12/2019 Document Reviewed: 04/12/2019 Elsevier Patient Education  2021 [employer].

## 2020-07-14 ENCOUNTER — Other Ambulatory Visit: Payer: Self-pay

## 2020-07-14 ENCOUNTER — Encounter: Payer: Self-pay | Admitting: Family Medicine

## 2020-07-14 ENCOUNTER — Ambulatory Visit (INDEPENDENT_AMBULATORY_CARE_PROVIDER_SITE_OTHER): Payer: Medicare HMO | Admitting: Family Medicine

## 2020-07-14 VITALS — BP 138/74 | HR 69 | Temp 98.1°F | Ht 63.0 in

## 2020-07-14 DIAGNOSIS — N1832 Chronic kidney disease, stage 3b: Secondary | ICD-10-CM | POA: Diagnosis not present

## 2020-07-14 DIAGNOSIS — R600 Localized edema: Secondary | ICD-10-CM | POA: Diagnosis not present

## 2020-07-14 DIAGNOSIS — T148XXA Other injury of unspecified body region, initial encounter: Secondary | ICD-10-CM

## 2020-07-14 MED ORDER — MUPIROCIN 2 % EX OINT: TOPICAL_OINTMENT | CUTANEOUS | 2 refills | Status: DC

## 2020-07-14 MED ORDER — TORSEMIDE 20 MG PO TABS: ORAL_TABLET | ORAL | 0 refills | Status: DC

## 2020-07-14 NOTE — Progress Notes (Signed)
   Subjective:    Patient ID: [name], female    DOB: Mar 31, 1927, 87 y.o.   MRN: [id number]  HPIrecheck blister on left lower leg. States it is not improving. Taking keflex.  Patient with some swelling in the lower legs.  Is taking her diuretic to each morning Has not had any major setbacks lately Denies any chest tightness shortness of breath Her energy level overall doing okay    Review of Systems Please see above no fevers denies tenderness    Objective:   Physical Exam  Pedal edema bilateral with blisters on the skin consistent with blistering from the edema I do not see evidence of staph infection      Assessment & Plan:  Take the antibiotics through Wednesday if doing well may stop Recheck in approximately 1 week Increase diuretic to 3 each morning Home health consult for monitoring and dressing changes Patient due to her age and her husband's age needs extra help within the home to be able to manage this

## 2020-07-21 ENCOUNTER — Other Ambulatory Visit: Payer: Self-pay

## 2020-07-21 ENCOUNTER — Ambulatory Visit (INDEPENDENT_AMBULATORY_CARE_PROVIDER_SITE_OTHER): Payer: Medicare HMO | Admitting: Family Medicine

## 2020-07-21 VITALS — Ht 63.0 in | Wt 174.0 lb

## 2020-07-21 DIAGNOSIS — R600 Localized edema: Secondary | ICD-10-CM

## 2020-07-21 MED ORDER — TORSEMIDE 20 MG PO TABS: ORAL_TABLET | ORAL | 0 refills | Status: DC

## 2020-07-21 NOTE — Progress Notes (Signed)
   Subjective:    Patient ID: [name], female    DOB: [birth year], 87 y.o.   MRN: [id number]  HPI  Patient arrives for a follow up on blister on left lower leg. When area got better another one popped up she is trying to use her diuretic she is try to keep her feet propped up when she is not active she is waiting on home health to come see her Review of Systems Swelling in the legs.    Objective:   Physical Exam  Lungs clear respiratory rate normal heart regular extremities 1-2+ edema.      Assessment & Plan:  Pedal edema Increase diuretic 3 each morning In addition to this communicate with nephrology so they can try to optimize diuretic use to keep swelling down to lessen the blistering in the skin Blistering the skin related to the swelling no sign of infection dressing applied home health consult Recheck in 4 weeks

## 2020-07-22 ENCOUNTER — Ambulatory Visit: Payer: Medicare HMO | Admitting: Family Medicine

## 2020-07-24 ENCOUNTER — Ambulatory Visit: Payer: Medicare HMO | Admitting: Family Medicine

## 2020-07-26 DIAGNOSIS — R238 Other skin changes: Secondary | ICD-10-CM | POA: Diagnosis not present

## 2020-07-26 DIAGNOSIS — I69351 Hemiplegia and hemiparesis following cerebral infarction affecting right dominant side: Secondary | ICD-10-CM | POA: Diagnosis not present

## 2020-07-26 DIAGNOSIS — M1030 Gout due to renal impairment, unspecified site: Secondary | ICD-10-CM | POA: Diagnosis not present

## 2020-07-26 DIAGNOSIS — Z4800 Encounter for change or removal of nonsurgical wound dressing: Secondary | ICD-10-CM | POA: Diagnosis not present

## 2020-07-26 DIAGNOSIS — N1832 Chronic kidney disease, stage 3b: Secondary | ICD-10-CM | POA: Diagnosis not present

## 2020-07-26 DIAGNOSIS — I130 Hypertensive heart and chronic kidney disease with heart failure and stage 1 through stage 4 chronic kidney disease, or unspecified chronic kidney disease: Secondary | ICD-10-CM | POA: Diagnosis not present

## 2020-07-26 DIAGNOSIS — I4891 Unspecified atrial fibrillation: Secondary | ICD-10-CM | POA: Diagnosis not present

## 2020-07-26 DIAGNOSIS — E1122 Type 2 diabetes mellitus with diabetic chronic kidney disease: Secondary | ICD-10-CM | POA: Diagnosis not present

## 2020-07-26 DIAGNOSIS — I509 Heart failure, unspecified: Secondary | ICD-10-CM | POA: Diagnosis not present

## 2020-07-28 ENCOUNTER — Telehealth: Payer: Self-pay | Admitting: Family Medicine

## 2020-07-28 DIAGNOSIS — I4891 Unspecified atrial fibrillation: Secondary | ICD-10-CM | POA: Diagnosis not present

## 2020-07-28 DIAGNOSIS — R238 Other skin changes: Secondary | ICD-10-CM | POA: Diagnosis not present

## 2020-07-28 DIAGNOSIS — Z4800 Encounter for change or removal of nonsurgical wound dressing: Secondary | ICD-10-CM | POA: Diagnosis not present

## 2020-07-28 DIAGNOSIS — M1030 Gout due to renal impairment, unspecified site: Secondary | ICD-10-CM | POA: Diagnosis not present

## 2020-07-28 DIAGNOSIS — E1122 Type 2 diabetes mellitus with diabetic chronic kidney disease: Secondary | ICD-10-CM | POA: Diagnosis not present

## 2020-07-28 DIAGNOSIS — I69351 Hemiplegia and hemiparesis following cerebral infarction affecting right dominant side: Secondary | ICD-10-CM | POA: Diagnosis not present

## 2020-07-28 DIAGNOSIS — N1832 Chronic kidney disease, stage 3b: Secondary | ICD-10-CM | POA: Diagnosis not present

## 2020-07-28 DIAGNOSIS — I130 Hypertensive heart and chronic kidney disease with heart failure and stage 1 through stage 4 chronic kidney disease, or unspecified chronic kidney disease: Secondary | ICD-10-CM | POA: Diagnosis not present

## 2020-07-28 DIAGNOSIS — I509 Heart failure, unspecified: Secondary | ICD-10-CM | POA: Diagnosis not present

## 2020-07-28 NOTE — Telephone Encounter (Signed)
I agree with this plan of action please notify home health thank you

## 2020-07-28 NOTE — Telephone Encounter (Signed)
Verbal order given to [facility]

## 2020-07-28 NOTE — Telephone Encounter (Signed)
Diane from home health calling to see if it is OK for patient to have legs wrapped. Home health would also like verbal orders for Xeroform twice weekly to affected areas on legs;so the family doesn't have to do daily dressing changes to legs. Please advise. Thank you  May leave detailed message on voicemail [phone]

## 2020-07-29 ENCOUNTER — Ambulatory Visit (INDEPENDENT_AMBULATORY_CARE_PROVIDER_SITE_OTHER): Payer: Medicare HMO | Admitting: *Deleted

## 2020-07-29 DIAGNOSIS — I4891 Unspecified atrial fibrillation: Secondary | ICD-10-CM

## 2020-07-29 DIAGNOSIS — I639 Cerebral infarction, unspecified: Secondary | ICD-10-CM

## 2020-07-29 DIAGNOSIS — Z5181 Encounter for therapeutic drug level monitoring: Secondary | ICD-10-CM | POA: Diagnosis not present

## 2020-07-29 LAB — POCT INR: INR: 2 (ref 2.0–3.0)

## 2020-07-29 NOTE — Patient Instructions (Signed)
Continue warfarin 1/2 tablet daily Recheck in 4 weeks 

## 2020-07-30 DIAGNOSIS — Z79899 Other long term (current) drug therapy: Secondary | ICD-10-CM | POA: Diagnosis not present

## 2020-07-31 ENCOUNTER — Telehealth: Payer: Self-pay

## 2020-07-31 DIAGNOSIS — N184 Chronic kidney disease, stage 4 (severe): Secondary | ICD-10-CM | POA: Diagnosis not present

## 2020-07-31 DIAGNOSIS — R809 Proteinuria, unspecified: Secondary | ICD-10-CM | POA: Diagnosis not present

## 2020-07-31 DIAGNOSIS — Z4800 Encounter for change or removal of nonsurgical wound dressing: Secondary | ICD-10-CM | POA: Diagnosis not present

## 2020-07-31 DIAGNOSIS — I4891 Unspecified atrial fibrillation: Secondary | ICD-10-CM | POA: Diagnosis not present

## 2020-07-31 DIAGNOSIS — I69351 Hemiplegia and hemiparesis following cerebral infarction affecting right dominant side: Secondary | ICD-10-CM | POA: Diagnosis not present

## 2020-07-31 DIAGNOSIS — E212 Other hyperparathyroidism: Secondary | ICD-10-CM | POA: Diagnosis not present

## 2020-07-31 DIAGNOSIS — E1122 Type 2 diabetes mellitus with diabetic chronic kidney disease: Secondary | ICD-10-CM | POA: Diagnosis not present

## 2020-07-31 DIAGNOSIS — I130 Hypertensive heart and chronic kidney disease with heart failure and stage 1 through stage 4 chronic kidney disease, or unspecified chronic kidney disease: Secondary | ICD-10-CM | POA: Diagnosis not present

## 2020-07-31 DIAGNOSIS — M1030 Gout due to renal impairment, unspecified site: Secondary | ICD-10-CM | POA: Diagnosis not present

## 2020-07-31 DIAGNOSIS — I129 Hypertensive chronic kidney disease with stage 1 through stage 4 chronic kidney disease, or unspecified chronic kidney disease: Secondary | ICD-10-CM | POA: Diagnosis not present

## 2020-07-31 DIAGNOSIS — R238 Other skin changes: Secondary | ICD-10-CM | POA: Diagnosis not present

## 2020-07-31 DIAGNOSIS — I509 Heart failure, unspecified: Secondary | ICD-10-CM | POA: Diagnosis not present

## 2020-07-31 DIAGNOSIS — N170 Acute kidney failure with tubular necrosis: Secondary | ICD-10-CM | POA: Diagnosis not present

## 2020-07-31 DIAGNOSIS — N1832 Chronic kidney disease, stage 3b: Secondary | ICD-10-CM | POA: Diagnosis not present

## 2020-07-31 DIAGNOSIS — D638 Anemia in other chronic diseases classified elsewhere: Secondary | ICD-10-CM | POA: Diagnosis not present

## 2020-07-31 LAB — BASIC METABOLIC PANEL
BUN/Creatinine Ratio: 16 (ref 12–28)
BUN: 58 mg/dL — ABNORMAL HIGH (ref 10–36)
CO2: 22 mmol/L (ref 20–29)
Calcium: 10 mg/dL (ref 8.7–10.3)
Chloride: 98 mmol/L (ref 96–106)
Creatinine, Ser: 3.67 mg/dL — ABNORMAL HIGH (ref 0.57–1.00)
GFR calc Af Amer: 12 mL/min/{1.73_m2} — ABNORMAL LOW (ref >59)
GFR calc non Af Amer: 10 mL/min/{1.73_m2} — ABNORMAL LOW (ref >59)
Glucose: 91 mg/dL (ref 65–99)
Potassium: 4.2 mmol/L (ref 3.5–5.2)
Sodium: 142 mmol/L (ref 134–144)

## 2020-07-31 NOTE — Telephone Encounter (Signed)
[name] returned call about [place]

## 2020-07-31 NOTE — Telephone Encounter (Signed)
[name] returned call to [place] about [name]

## 2020-08-01 ENCOUNTER — Other Ambulatory Visit: Payer: Self-pay

## 2020-08-01 MED ORDER — TORSEMIDE 20 MG PO TABS: ORAL_TABLET | ORAL | 0 refills | Status: DC

## 2020-08-01 NOTE — Telephone Encounter (Signed)
Daughter contacted and verbalized understanding. See result note

## 2020-08-05 ENCOUNTER — Ambulatory Visit: Payer: Medicare HMO | Admitting: Family Medicine

## 2020-08-06 ENCOUNTER — Ambulatory Visit: Payer: Medicare HMO | Admitting: Family Medicine

## 2020-08-07 ENCOUNTER — Encounter: Payer: Self-pay | Admitting: Family Medicine

## 2020-08-07 ENCOUNTER — Other Ambulatory Visit: Payer: Self-pay

## 2020-08-07 ENCOUNTER — Ambulatory Visit (INDEPENDENT_AMBULATORY_CARE_PROVIDER_SITE_OTHER): Payer: Medicare HMO | Admitting: Family Medicine

## 2020-08-07 VITALS — BP 150/72 | HR 79 | Temp 97.6°F | Ht 63.0 in | Wt 166.0 lb

## 2020-08-07 DIAGNOSIS — T148XXA Other injury of unspecified body region, initial encounter: Secondary | ICD-10-CM

## 2020-08-07 DIAGNOSIS — M79605 Pain in left leg: Secondary | ICD-10-CM

## 2020-08-07 NOTE — Patient Instructions (Signed)
Find out the home health nurse schedule for wound care.

## 2020-08-07 NOTE — Progress Notes (Signed)
Patient ID: [name], female    DOB: 20-Jun-1926, 87 y.o.   MRN: [id number]   Chief Complaint  Patient presents with  . Blister   Subjective:  CC: follow-up for blister   This is not a new problem. Presents today for follow-up on needing the left lower extremity blisters rechecked and dressing replaced. Reports that home health has been initiated, they have been to  Her house x 2, the last visit was last week, no one has been there so far this week. Reports she notified [facility] last night to inquire about [place] visits this week, no response yet.  Goal of visit today is to have blisters assessed and for a dressing change to be preformed. Denies fever, chills,  chest pain, has occasional shortness of breath (not new), Denies purulence from lower extremity blisters.   Originally seen: Feb 2 and Feb 7 for this problem. Saw Dr. Wolfgang Phoenix for pedal edema on Feb 14, diuretic dose adjusted at that time, referral place for nephrologist, seen on Feb 24 where her diuretic dose was adjusted. Will get repeat labs pre this specialist will repeat labs in  2 weeks. Presents today for left lower extremity recheck and dressing change.      recheck blister on left leg.    Medical History Dabney has a past medical history of Anemia, Angioedema (08/2006), Atrial fibrillation ([place]), Benign thyroid cyst (12/30/2018), Blood transfusion, Cerebrovascular accident ([place]) (2006), Chronic bronchitis, Chronic renal insufficiency, DJD (degenerative joint disease), Gout, Hyperlipidemia, Hypertension, Kidney stones, Mitral valve disease, Nephrolithiasis, Pacemaker, Retinal hemorrhage, Seizure disorder ([place]), Shortness of breath, Stroke ([place]), Tachycardia-bradycardia syndrome ([place]), and Vertigo.   Outpatient Encounter Medications as of 08/07/2020  Medication Sig  . albuterol (VENTOLIN HFA) 108 (90 Base) MCG/ACT inhaler TAKE 2 PUFFS EVERY 6 HOURS AS NEEDED  . allopurinol (ZYLOPRIM) 100 MG tablet TAKE 1/2 TABLET BY MOUTH ON MONDAY  AND FRIDAY  . amLODipine (NORVASC) 5 MG tablet Take 1 tablet (5 mg total) by mouth daily.  Marland Kitchen diltiazem (TIAZAC) 360 MG 24 hr capsule TAKE ONE CAPSULE BY MOUTH ONCE DAILY.  . ferrous sulfate 324 (65 Fe) MG TBEC Take 1 tablet by mouth daily.  . meclizine (ANTIVERT) 25 MG tablet Take 1 tablet (25 mg total) by mouth 2 (two) times daily as needed for dizziness.  . mupirocin ointment (BACTROBAN) 2 % Apply thin amount daily to skin blister  . pravastatin (PRAVACHOL) 80 MG tablet TAKE (1) TABLET BY MOUTH AT BEDTIME.  Marland Kitchen torsemide (DEMADEX) 20 MG tablet TAKE 2 qam  . Travoprost, BAK Free, (TRAVATAN) 0.004 % SOLN ophthalmic solution Place 1 drop into both eyes at bedtime.   Marland Kitchen VITAMIN D, CHOLECALCIFEROL, PO Take 1 tablet by mouth every morning.  . warfarin (COUMADIN) 5 MG tablet TAKE 1/2 TO 1 TABLET BY MOUTH DAILY OR AS DIRECTED.  . [DISCONTINUED] cephALEXin (KEFLEX) 500 MG capsule Take 1 capsule (500 mg total) by mouth 2 (two) times daily.   No facility-administered encounter medications on file as of 08/07/2020.     Review of Systems  Constitutional: Negative for chills and fever.  Respiratory: Negative for shortness of breath.   Cardiovascular: Positive for leg swelling. Negative for chest pain.       Left swelling greater than right.   Skin: Positive for wound.       Left lower extremity blister areas: no sign of infection today, skin pink, appears to be "healing" slightly. Home health nurse x 2.      Vitals  BP (!) 150/72   Pulse 79   Temp 97.6 F (36.4 C)   Ht '5\' 3"'$  (1.6 m)   Wt 166 lb (75.3 kg)   SpO2 95%   BMI 29.41 kg/m   Objective:   Physical Exam Vitals reviewed.  Constitutional:      Appearance: Normal appearance.  Cardiovascular:     Rate and Rhythm: Normal rate and regular rhythm.     Heart sounds: Normal heart sounds.     Comments: Leg dressing replaced today, making sure not too tight.  Pulmonary:     Effort: Pulmonary effort is normal.     Breath sounds: Normal  breath sounds.     Comments: No shortness of breath in office today. Needs to take diuretic once gets home.  Musculoskeletal:     Left lower leg: 1+ Edema present.     Comments: Left lower extremity swelling > than right. 2 blisters no sign of infection, home health nurses involved with wound care.   Skin:    General: Skin is warm and dry.  Neurological:     General: No focal deficit present.     Mental Status: She is alert.  Psychiatric:        Behavior: Behavior normal.      Assessment and Plan   1. Blister  Left lower blisters without evidence of infection, healing occurring slowly. Needed dressing changed. Health health has not performed wound care this week so far, encouraged patient to call [facility] to inquired about schedule this week.  Left lower extremity edema 1 + pitting L>R, blisters healing, no purulence, no redness.  Able to converse throughout visit without shortness of breath, Oxygen saturation 95%.   Agrees with plan of care discussed today. Understands warning signs to seek further care: chest pain, shortness of breath, any significant change in health.  Understands to follow-up as previously scheduled with Dr. Wolfgang Phoenix, sooner if needed. Hopefully home health will come later this week for wound care and assessment.     Meghan Ades, NP 08/07/2020

## 2020-08-08 DIAGNOSIS — E1122 Type 2 diabetes mellitus with diabetic chronic kidney disease: Secondary | ICD-10-CM | POA: Diagnosis not present

## 2020-08-08 DIAGNOSIS — Z4800 Encounter for change or removal of nonsurgical wound dressing: Secondary | ICD-10-CM | POA: Diagnosis not present

## 2020-08-08 DIAGNOSIS — N1832 Chronic kidney disease, stage 3b: Secondary | ICD-10-CM | POA: Diagnosis not present

## 2020-08-08 DIAGNOSIS — I130 Hypertensive heart and chronic kidney disease with heart failure and stage 1 through stage 4 chronic kidney disease, or unspecified chronic kidney disease: Secondary | ICD-10-CM | POA: Diagnosis not present

## 2020-08-08 DIAGNOSIS — I509 Heart failure, unspecified: Secondary | ICD-10-CM | POA: Diagnosis not present

## 2020-08-08 DIAGNOSIS — R238 Other skin changes: Secondary | ICD-10-CM | POA: Diagnosis not present

## 2020-08-08 DIAGNOSIS — M1030 Gout due to renal impairment, unspecified site: Secondary | ICD-10-CM | POA: Diagnosis not present

## 2020-08-08 DIAGNOSIS — I4891 Unspecified atrial fibrillation: Secondary | ICD-10-CM | POA: Diagnosis not present

## 2020-08-08 DIAGNOSIS — I69351 Hemiplegia and hemiparesis following cerebral infarction affecting right dominant side: Secondary | ICD-10-CM | POA: Diagnosis not present

## 2020-08-11 ENCOUNTER — Telehealth: Payer: Self-pay | Admitting: *Deleted

## 2020-08-11 ENCOUNTER — Other Ambulatory Visit: Payer: Self-pay | Admitting: *Deleted

## 2020-08-11 DIAGNOSIS — R238 Other skin changes: Secondary | ICD-10-CM | POA: Diagnosis not present

## 2020-08-11 DIAGNOSIS — E1122 Type 2 diabetes mellitus with diabetic chronic kidney disease: Secondary | ICD-10-CM | POA: Diagnosis not present

## 2020-08-11 DIAGNOSIS — M1030 Gout due to renal impairment, unspecified site: Secondary | ICD-10-CM | POA: Diagnosis not present

## 2020-08-11 DIAGNOSIS — Z4800 Encounter for change or removal of nonsurgical wound dressing: Secondary | ICD-10-CM | POA: Diagnosis not present

## 2020-08-11 DIAGNOSIS — I130 Hypertensive heart and chronic kidney disease with heart failure and stage 1 through stage 4 chronic kidney disease, or unspecified chronic kidney disease: Secondary | ICD-10-CM | POA: Diagnosis not present

## 2020-08-11 DIAGNOSIS — I509 Heart failure, unspecified: Secondary | ICD-10-CM | POA: Diagnosis not present

## 2020-08-11 DIAGNOSIS — I739 Peripheral vascular disease, unspecified: Secondary | ICD-10-CM

## 2020-08-11 DIAGNOSIS — I69351 Hemiplegia and hemiparesis following cerebral infarction affecting right dominant side: Secondary | ICD-10-CM | POA: Diagnosis not present

## 2020-08-11 DIAGNOSIS — N1832 Chronic kidney disease, stage 3b: Secondary | ICD-10-CM | POA: Diagnosis not present

## 2020-08-11 DIAGNOSIS — I4891 Unspecified atrial fibrillation: Secondary | ICD-10-CM | POA: Diagnosis not present

## 2020-08-11 NOTE — Telephone Encounter (Signed)
I certainly understand the concern and the restrictions on what could be done  Please go ahead and have our office schedule her with Dr. Sherren Mocha early for vascular evaluation, his office is here on [address]

## 2020-08-11 NOTE — Telephone Encounter (Signed)
Diane nurse from [facility] [phone]  Diane states she is going to see pt today around noon. She has blisters on both ankles. None healing wounds. She is assuming she has peripheral vascular disease but that diagnosis not on her chart. States she has not made her appt with vein and vascular like dr Wolfgang Phoenix recommended. States she needs a doppler before she can use compression on her.  Nurse unsure what she can do for her when she goes out today.

## 2020-08-11 NOTE — Telephone Encounter (Signed)
Referral put in. Diane home health nurse notified. She states this has been done before and pt will not make appt. I called pt to see if she was ok with me scheduling appt and no answer.

## 2020-08-12 NOTE — Telephone Encounter (Signed)
Home health nurse diane told me referral was already made but pt would not call dr early's office back to make appt. I called pt to ask her if she would let me make her an appt. She declines appt at this time.

## 2020-08-13 DIAGNOSIS — I10 Essential (primary) hypertension: Secondary | ICD-10-CM | POA: Diagnosis not present

## 2020-08-13 DIAGNOSIS — M1000 Idiopathic gout, unspecified site: Secondary | ICD-10-CM | POA: Diagnosis not present

## 2020-08-14 DIAGNOSIS — D638 Anemia in other chronic diseases classified elsewhere: Secondary | ICD-10-CM | POA: Diagnosis not present

## 2020-08-14 DIAGNOSIS — N170 Acute kidney failure with tubular necrosis: Secondary | ICD-10-CM | POA: Diagnosis not present

## 2020-08-14 DIAGNOSIS — R809 Proteinuria, unspecified: Secondary | ICD-10-CM | POA: Diagnosis not present

## 2020-08-14 DIAGNOSIS — I129 Hypertensive chronic kidney disease with stage 1 through stage 4 chronic kidney disease, or unspecified chronic kidney disease: Secondary | ICD-10-CM | POA: Diagnosis not present

## 2020-08-14 DIAGNOSIS — E212 Other hyperparathyroidism: Secondary | ICD-10-CM | POA: Diagnosis not present

## 2020-08-14 DIAGNOSIS — N184 Chronic kidney disease, stage 4 (severe): Secondary | ICD-10-CM | POA: Diagnosis not present

## 2020-08-14 DIAGNOSIS — R600 Localized edema: Secondary | ICD-10-CM | POA: Diagnosis not present

## 2020-08-14 LAB — BASIC METABOLIC PANEL
BUN/Creatinine Ratio: 13 (ref 12–28)
BUN: 46 mg/dL — ABNORMAL HIGH (ref 10–36)
CO2: 21 mmol/L (ref 20–29)
Calcium: 10.2 mg/dL (ref 8.7–10.3)
Chloride: 99 mmol/L (ref 96–106)
Creatinine, Ser: 3.54 mg/dL — ABNORMAL HIGH (ref 0.57–1.00)
Glucose: 102 mg/dL — ABNORMAL HIGH (ref 65–99)
Potassium: 4.8 mmol/L (ref 3.5–5.2)
Sodium: 145 mmol/L — ABNORMAL HIGH (ref 134–144)
eGFR: 12 mL/min/{1.73_m2} — ABNORMAL LOW (ref >59)

## 2020-08-14 LAB — URIC ACID: Uric Acid: 9.3 mg/dL — ABNORMAL HIGH (ref 3.1–7.9)

## 2020-08-15 DIAGNOSIS — N1832 Chronic kidney disease, stage 3b: Secondary | ICD-10-CM | POA: Diagnosis not present

## 2020-08-15 DIAGNOSIS — I130 Hypertensive heart and chronic kidney disease with heart failure and stage 1 through stage 4 chronic kidney disease, or unspecified chronic kidney disease: Secondary | ICD-10-CM | POA: Diagnosis not present

## 2020-08-15 DIAGNOSIS — M1030 Gout due to renal impairment, unspecified site: Secondary | ICD-10-CM | POA: Diagnosis not present

## 2020-08-15 DIAGNOSIS — I509 Heart failure, unspecified: Secondary | ICD-10-CM | POA: Diagnosis not present

## 2020-08-15 DIAGNOSIS — E1122 Type 2 diabetes mellitus with diabetic chronic kidney disease: Secondary | ICD-10-CM | POA: Diagnosis not present

## 2020-08-15 DIAGNOSIS — Z4800 Encounter for change or removal of nonsurgical wound dressing: Secondary | ICD-10-CM | POA: Diagnosis not present

## 2020-08-15 DIAGNOSIS — I69351 Hemiplegia and hemiparesis following cerebral infarction affecting right dominant side: Secondary | ICD-10-CM | POA: Diagnosis not present

## 2020-08-15 DIAGNOSIS — R238 Other skin changes: Secondary | ICD-10-CM | POA: Diagnosis not present

## 2020-08-15 DIAGNOSIS — I4891 Unspecified atrial fibrillation: Secondary | ICD-10-CM | POA: Diagnosis not present

## 2020-08-15 NOTE — Telephone Encounter (Signed)
You can only do what the patient allows [place] to do

## 2020-08-18 ENCOUNTER — Ambulatory Visit: Payer: Medicare HMO | Admitting: Family Medicine

## 2020-08-18 DIAGNOSIS — N1832 Chronic kidney disease, stage 3b: Secondary | ICD-10-CM | POA: Diagnosis not present

## 2020-08-18 DIAGNOSIS — E1122 Type 2 diabetes mellitus with diabetic chronic kidney disease: Secondary | ICD-10-CM | POA: Diagnosis not present

## 2020-08-18 DIAGNOSIS — R238 Other skin changes: Secondary | ICD-10-CM | POA: Diagnosis not present

## 2020-08-18 DIAGNOSIS — M1030 Gout due to renal impairment, unspecified site: Secondary | ICD-10-CM | POA: Diagnosis not present

## 2020-08-18 DIAGNOSIS — I69351 Hemiplegia and hemiparesis following cerebral infarction affecting right dominant side: Secondary | ICD-10-CM | POA: Diagnosis not present

## 2020-08-18 DIAGNOSIS — I509 Heart failure, unspecified: Secondary | ICD-10-CM | POA: Diagnosis not present

## 2020-08-18 DIAGNOSIS — I4891 Unspecified atrial fibrillation: Secondary | ICD-10-CM | POA: Diagnosis not present

## 2020-08-18 DIAGNOSIS — I130 Hypertensive heart and chronic kidney disease with heart failure and stage 1 through stage 4 chronic kidney disease, or unspecified chronic kidney disease: Secondary | ICD-10-CM | POA: Diagnosis not present

## 2020-08-18 DIAGNOSIS — Z4800 Encounter for change or removal of nonsurgical wound dressing: Secondary | ICD-10-CM | POA: Diagnosis not present

## 2020-08-21 DIAGNOSIS — I4891 Unspecified atrial fibrillation: Secondary | ICD-10-CM | POA: Diagnosis not present

## 2020-08-21 DIAGNOSIS — M1030 Gout due to renal impairment, unspecified site: Secondary | ICD-10-CM | POA: Diagnosis not present

## 2020-08-21 DIAGNOSIS — I130 Hypertensive heart and chronic kidney disease with heart failure and stage 1 through stage 4 chronic kidney disease, or unspecified chronic kidney disease: Secondary | ICD-10-CM | POA: Diagnosis not present

## 2020-08-21 DIAGNOSIS — Z4800 Encounter for change or removal of nonsurgical wound dressing: Secondary | ICD-10-CM | POA: Diagnosis not present

## 2020-08-21 DIAGNOSIS — I69351 Hemiplegia and hemiparesis following cerebral infarction affecting right dominant side: Secondary | ICD-10-CM | POA: Diagnosis not present

## 2020-08-21 DIAGNOSIS — N1832 Chronic kidney disease, stage 3b: Secondary | ICD-10-CM | POA: Diagnosis not present

## 2020-08-21 DIAGNOSIS — E1122 Type 2 diabetes mellitus with diabetic chronic kidney disease: Secondary | ICD-10-CM | POA: Diagnosis not present

## 2020-08-21 DIAGNOSIS — I509 Heart failure, unspecified: Secondary | ICD-10-CM | POA: Diagnosis not present

## 2020-08-21 DIAGNOSIS — R238 Other skin changes: Secondary | ICD-10-CM | POA: Diagnosis not present

## 2020-08-25 DIAGNOSIS — Z4800 Encounter for change or removal of nonsurgical wound dressing: Secondary | ICD-10-CM | POA: Diagnosis not present

## 2020-08-25 DIAGNOSIS — I69351 Hemiplegia and hemiparesis following cerebral infarction affecting right dominant side: Secondary | ICD-10-CM | POA: Diagnosis not present

## 2020-08-25 DIAGNOSIS — N1832 Chronic kidney disease, stage 3b: Secondary | ICD-10-CM | POA: Diagnosis not present

## 2020-08-25 DIAGNOSIS — M1030 Gout due to renal impairment, unspecified site: Secondary | ICD-10-CM | POA: Diagnosis not present

## 2020-08-25 DIAGNOSIS — E1122 Type 2 diabetes mellitus with diabetic chronic kidney disease: Secondary | ICD-10-CM | POA: Diagnosis not present

## 2020-08-25 DIAGNOSIS — I4891 Unspecified atrial fibrillation: Secondary | ICD-10-CM | POA: Diagnosis not present

## 2020-08-25 DIAGNOSIS — I509 Heart failure, unspecified: Secondary | ICD-10-CM | POA: Diagnosis not present

## 2020-08-25 DIAGNOSIS — R238 Other skin changes: Secondary | ICD-10-CM | POA: Diagnosis not present

## 2020-08-25 DIAGNOSIS — I130 Hypertensive heart and chronic kidney disease with heart failure and stage 1 through stage 4 chronic kidney disease, or unspecified chronic kidney disease: Secondary | ICD-10-CM | POA: Diagnosis not present

## 2020-08-26 ENCOUNTER — Ambulatory Visit (INDEPENDENT_AMBULATORY_CARE_PROVIDER_SITE_OTHER): Payer: Medicare HMO | Admitting: *Deleted

## 2020-08-26 DIAGNOSIS — I4891 Unspecified atrial fibrillation: Secondary | ICD-10-CM | POA: Diagnosis not present

## 2020-08-26 DIAGNOSIS — Z5181 Encounter for therapeutic drug level monitoring: Secondary | ICD-10-CM | POA: Diagnosis not present

## 2020-08-26 DIAGNOSIS — I639 Cerebral infarction, unspecified: Secondary | ICD-10-CM | POA: Diagnosis not present

## 2020-08-26 LAB — POCT INR: INR: 1.8 — AB (ref 2.0–3.0)

## 2020-08-26 NOTE — Patient Instructions (Signed)
Increase warfarin to 1/2 tablet daily except 1 tablet on Tuesdays Recheck in 4 weeks

## 2020-08-28 ENCOUNTER — Telehealth: Payer: Self-pay

## 2020-08-28 ENCOUNTER — Telehealth: Payer: Medicare HMO | Admitting: Family Medicine

## 2020-08-28 DIAGNOSIS — N170 Acute kidney failure with tubular necrosis: Secondary | ICD-10-CM | POA: Diagnosis not present

## 2020-08-28 DIAGNOSIS — N1832 Chronic kidney disease, stage 3b: Secondary | ICD-10-CM | POA: Diagnosis not present

## 2020-08-28 DIAGNOSIS — I4891 Unspecified atrial fibrillation: Secondary | ICD-10-CM | POA: Diagnosis not present

## 2020-08-28 DIAGNOSIS — R238 Other skin changes: Secondary | ICD-10-CM | POA: Diagnosis not present

## 2020-08-28 DIAGNOSIS — I129 Hypertensive chronic kidney disease with stage 1 through stage 4 chronic kidney disease, or unspecified chronic kidney disease: Secondary | ICD-10-CM | POA: Diagnosis not present

## 2020-08-28 DIAGNOSIS — I509 Heart failure, unspecified: Secondary | ICD-10-CM | POA: Diagnosis not present

## 2020-08-28 DIAGNOSIS — T148XXA Other injury of unspecified body region, initial encounter: Secondary | ICD-10-CM

## 2020-08-28 DIAGNOSIS — I69351 Hemiplegia and hemiparesis following cerebral infarction affecting right dominant side: Secondary | ICD-10-CM | POA: Diagnosis not present

## 2020-08-28 DIAGNOSIS — I130 Hypertensive heart and chronic kidney disease with heart failure and stage 1 through stage 4 chronic kidney disease, or unspecified chronic kidney disease: Secondary | ICD-10-CM | POA: Diagnosis not present

## 2020-08-28 DIAGNOSIS — E1122 Type 2 diabetes mellitus with diabetic chronic kidney disease: Secondary | ICD-10-CM | POA: Diagnosis not present

## 2020-08-28 DIAGNOSIS — Z4800 Encounter for change or removal of nonsurgical wound dressing: Secondary | ICD-10-CM | POA: Diagnosis not present

## 2020-08-28 DIAGNOSIS — N184 Chronic kidney disease, stage 4 (severe): Secondary | ICD-10-CM | POA: Diagnosis not present

## 2020-08-28 DIAGNOSIS — M1030 Gout due to renal impairment, unspecified site: Secondary | ICD-10-CM | POA: Diagnosis not present

## 2020-08-28 DIAGNOSIS — Z79899 Other long term (current) drug therapy: Secondary | ICD-10-CM | POA: Diagnosis not present

## 2020-08-28 NOTE — Telephone Encounter (Signed)
Please advise. Thank you

## 2020-08-28 NOTE — Telephone Encounter (Signed)
[name] is out at the home of [name] they two sores that they have been working with healed but there has been another sore that pop up on her lower left leg over night about the size of a quarter and nurse is wanting advice as what to do. There is not pain no heat in the leg. No swelling in the leg as well. One blister above the two that healed.   Solmon Ice- [id number]

## 2020-08-28 NOTE — Telephone Encounter (Signed)
So at this point I would recommend wound consultation with the wound center in [facility] for further evaluation.  These are repetitive the need further work-up.  Please work with family include [name] in this discussion so that she understands the importance of this  If the blister is open I recommend DuoDERM every 3 days

## 2020-08-29 DIAGNOSIS — I129 Hypertensive chronic kidney disease with stage 1 through stage 4 chronic kidney disease, or unspecified chronic kidney disease: Secondary | ICD-10-CM | POA: Diagnosis not present

## 2020-08-29 DIAGNOSIS — E875 Hyperkalemia: Secondary | ICD-10-CM | POA: Diagnosis not present

## 2020-08-29 DIAGNOSIS — N170 Acute kidney failure with tubular necrosis: Secondary | ICD-10-CM | POA: Diagnosis not present

## 2020-08-29 DIAGNOSIS — N184 Chronic kidney disease, stage 4 (severe): Secondary | ICD-10-CM | POA: Diagnosis not present

## 2020-08-29 DIAGNOSIS — D638 Anemia in other chronic diseases classified elsewhere: Secondary | ICD-10-CM | POA: Diagnosis not present

## 2020-09-01 ENCOUNTER — Telehealth: Payer: Self-pay

## 2020-09-01 DIAGNOSIS — T148XXA Other injury of unspecified body region, initial encounter: Secondary | ICD-10-CM

## 2020-09-01 DIAGNOSIS — I69351 Hemiplegia and hemiparesis following cerebral infarction affecting right dominant side: Secondary | ICD-10-CM | POA: Diagnosis not present

## 2020-09-01 DIAGNOSIS — I509 Heart failure, unspecified: Secondary | ICD-10-CM | POA: Diagnosis not present

## 2020-09-01 DIAGNOSIS — N1832 Chronic kidney disease, stage 3b: Secondary | ICD-10-CM | POA: Diagnosis not present

## 2020-09-01 DIAGNOSIS — I4891 Unspecified atrial fibrillation: Secondary | ICD-10-CM | POA: Diagnosis not present

## 2020-09-01 DIAGNOSIS — I130 Hypertensive heart and chronic kidney disease with heart failure and stage 1 through stage 4 chronic kidney disease, or unspecified chronic kidney disease: Secondary | ICD-10-CM | POA: Diagnosis not present

## 2020-09-01 DIAGNOSIS — E1122 Type 2 diabetes mellitus with diabetic chronic kidney disease: Secondary | ICD-10-CM | POA: Diagnosis not present

## 2020-09-01 DIAGNOSIS — Z4800 Encounter for change or removal of nonsurgical wound dressing: Secondary | ICD-10-CM | POA: Diagnosis not present

## 2020-09-01 DIAGNOSIS — M1030 Gout due to renal impairment, unspecified site: Secondary | ICD-10-CM | POA: Diagnosis not present

## 2020-09-01 DIAGNOSIS — R238 Other skin changes: Secondary | ICD-10-CM | POA: Diagnosis not present

## 2020-09-01 NOTE — Telephone Encounter (Signed)
Nurses also go ahead with referral to wound center, [place] would be fine (unless family wants to go to [place])

## 2020-09-01 NOTE — Telephone Encounter (Signed)
Referral to wound center placed. Left message to return call

## 2020-09-01 NOTE — Telephone Encounter (Signed)
Diane with [facility] calling to give a update on Nyana 4th blister now on right shine 8cm x 7cm x 3cm about size goose egg she has agreed to go to wound center or [facility]  Diane call back [phone]

## 2020-09-01 NOTE — Telephone Encounter (Signed)
Diane contacted. Per previous message may use Duoderm if blisters open for 3 days. Diane verbalized understanding. STAT referral to vascular placed. Left message to return call with [name]

## 2020-09-01 NOTE — Telephone Encounter (Signed)
[name] states that while she was at the home, the area did drain and she had a lot of water in blister; had to put a towel beneath leg. Home Health put dry gauze on area.

## 2020-09-01 NOTE — Telephone Encounter (Signed)
Urgent referral placed and Home Health nurse contacted. Home health verbalized understanding. See other message

## 2020-09-02 ENCOUNTER — Telehealth: Payer: Self-pay

## 2020-09-02 NOTE — Telephone Encounter (Signed)
[name] returned [place] Call cell phone [phone]

## 2020-09-02 NOTE — Telephone Encounter (Signed)
Left message to return call- [name]

## 2020-09-02 NOTE — Telephone Encounter (Signed)
[name](DPR) notified and verbalized understanding

## 2020-09-03 NOTE — Telephone Encounter (Signed)
Error

## 2020-09-04 ENCOUNTER — Other Ambulatory Visit: Payer: Self-pay | Admitting: Family Medicine

## 2020-09-04 DIAGNOSIS — I4891 Unspecified atrial fibrillation: Secondary | ICD-10-CM | POA: Diagnosis not present

## 2020-09-04 DIAGNOSIS — E1122 Type 2 diabetes mellitus with diabetic chronic kidney disease: Secondary | ICD-10-CM | POA: Diagnosis not present

## 2020-09-04 DIAGNOSIS — I69351 Hemiplegia and hemiparesis following cerebral infarction affecting right dominant side: Secondary | ICD-10-CM | POA: Diagnosis not present

## 2020-09-04 DIAGNOSIS — M1030 Gout due to renal impairment, unspecified site: Secondary | ICD-10-CM | POA: Diagnosis not present

## 2020-09-04 DIAGNOSIS — N1832 Chronic kidney disease, stage 3b: Secondary | ICD-10-CM | POA: Diagnosis not present

## 2020-09-04 DIAGNOSIS — R238 Other skin changes: Secondary | ICD-10-CM | POA: Diagnosis not present

## 2020-09-04 DIAGNOSIS — I130 Hypertensive heart and chronic kidney disease with heart failure and stage 1 through stage 4 chronic kidney disease, or unspecified chronic kidney disease: Secondary | ICD-10-CM | POA: Diagnosis not present

## 2020-09-04 DIAGNOSIS — Z4800 Encounter for change or removal of nonsurgical wound dressing: Secondary | ICD-10-CM | POA: Diagnosis not present

## 2020-09-04 DIAGNOSIS — I509 Heart failure, unspecified: Secondary | ICD-10-CM | POA: Diagnosis not present

## 2020-09-08 DIAGNOSIS — I69351 Hemiplegia and hemiparesis following cerebral infarction affecting right dominant side: Secondary | ICD-10-CM | POA: Diagnosis not present

## 2020-09-08 DIAGNOSIS — M1030 Gout due to renal impairment, unspecified site: Secondary | ICD-10-CM | POA: Diagnosis not present

## 2020-09-08 DIAGNOSIS — I4891 Unspecified atrial fibrillation: Secondary | ICD-10-CM | POA: Diagnosis not present

## 2020-09-08 DIAGNOSIS — I130 Hypertensive heart and chronic kidney disease with heart failure and stage 1 through stage 4 chronic kidney disease, or unspecified chronic kidney disease: Secondary | ICD-10-CM | POA: Diagnosis not present

## 2020-09-08 DIAGNOSIS — Z4800 Encounter for change or removal of nonsurgical wound dressing: Secondary | ICD-10-CM | POA: Diagnosis not present

## 2020-09-08 DIAGNOSIS — R238 Other skin changes: Secondary | ICD-10-CM | POA: Diagnosis not present

## 2020-09-08 DIAGNOSIS — E1122 Type 2 diabetes mellitus with diabetic chronic kidney disease: Secondary | ICD-10-CM | POA: Diagnosis not present

## 2020-09-08 DIAGNOSIS — N1832 Chronic kidney disease, stage 3b: Secondary | ICD-10-CM | POA: Diagnosis not present

## 2020-09-08 DIAGNOSIS — I509 Heart failure, unspecified: Secondary | ICD-10-CM | POA: Diagnosis not present

## 2020-09-11 DIAGNOSIS — M1030 Gout due to renal impairment, unspecified site: Secondary | ICD-10-CM | POA: Diagnosis not present

## 2020-09-11 DIAGNOSIS — I4891 Unspecified atrial fibrillation: Secondary | ICD-10-CM | POA: Diagnosis not present

## 2020-09-11 DIAGNOSIS — I130 Hypertensive heart and chronic kidney disease with heart failure and stage 1 through stage 4 chronic kidney disease, or unspecified chronic kidney disease: Secondary | ICD-10-CM | POA: Diagnosis not present

## 2020-09-11 DIAGNOSIS — Z4800 Encounter for change or removal of nonsurgical wound dressing: Secondary | ICD-10-CM | POA: Diagnosis not present

## 2020-09-11 DIAGNOSIS — I69351 Hemiplegia and hemiparesis following cerebral infarction affecting right dominant side: Secondary | ICD-10-CM | POA: Diagnosis not present

## 2020-09-11 DIAGNOSIS — I509 Heart failure, unspecified: Secondary | ICD-10-CM | POA: Diagnosis not present

## 2020-09-11 DIAGNOSIS — N1832 Chronic kidney disease, stage 3b: Secondary | ICD-10-CM | POA: Diagnosis not present

## 2020-09-11 DIAGNOSIS — E1122 Type 2 diabetes mellitus with diabetic chronic kidney disease: Secondary | ICD-10-CM | POA: Diagnosis not present

## 2020-09-11 DIAGNOSIS — R238 Other skin changes: Secondary | ICD-10-CM | POA: Diagnosis not present

## 2020-09-15 ENCOUNTER — Encounter: Payer: Medicare HMO | Attending: Physician Assistant | Admitting: Physician Assistant

## 2020-09-15 ENCOUNTER — Other Ambulatory Visit: Payer: Self-pay

## 2020-09-15 DIAGNOSIS — L97811 Non-pressure chronic ulcer of other part of right lower leg limited to breakdown of skin: Secondary | ICD-10-CM | POA: Insufficient documentation

## 2020-09-15 DIAGNOSIS — N184 Chronic kidney disease, stage 4 (severe): Secondary | ICD-10-CM | POA: Insufficient documentation

## 2020-09-15 DIAGNOSIS — Z79899 Other long term (current) drug therapy: Secondary | ICD-10-CM | POA: Insufficient documentation

## 2020-09-15 DIAGNOSIS — I872 Venous insufficiency (chronic) (peripheral): Secondary | ICD-10-CM | POA: Insufficient documentation

## 2020-09-15 DIAGNOSIS — I480 Paroxysmal atrial fibrillation: Secondary | ICD-10-CM | POA: Insufficient documentation

## 2020-09-15 DIAGNOSIS — L97812 Non-pressure chronic ulcer of other part of right lower leg with fat layer exposed: Secondary | ICD-10-CM | POA: Diagnosis not present

## 2020-09-15 DIAGNOSIS — Z950 Presence of cardiac pacemaker: Secondary | ICD-10-CM | POA: Insufficient documentation

## 2020-09-15 DIAGNOSIS — Z885 Allergy status to narcotic agent status: Secondary | ICD-10-CM | POA: Insufficient documentation

## 2020-09-15 DIAGNOSIS — E1122 Type 2 diabetes mellitus with diabetic chronic kidney disease: Secondary | ICD-10-CM | POA: Diagnosis not present

## 2020-09-15 DIAGNOSIS — I890 Lymphedema, not elsewhere classified: Secondary | ICD-10-CM | POA: Diagnosis not present

## 2020-09-15 DIAGNOSIS — L97821 Non-pressure chronic ulcer of other part of left lower leg limited to breakdown of skin: Secondary | ICD-10-CM | POA: Insufficient documentation

## 2020-09-15 DIAGNOSIS — E11622 Type 2 diabetes mellitus with other skin ulcer: Secondary | ICD-10-CM | POA: Insufficient documentation

## 2020-09-15 DIAGNOSIS — L97822 Non-pressure chronic ulcer of other part of left lower leg with fat layer exposed: Secondary | ICD-10-CM | POA: Diagnosis not present

## 2020-09-15 NOTE — Progress Notes (Addendum)
Meghan, White ([id number]) Visit Report for 09/15/2020 Chief Complaint Document Details Patient Name: [name], [name]. Date of Service: 09/15/2020 1:00 PM Medical Record Number: [id number] Patient Account Number: [id number] Date of Birth/Sex: [birth year] (87 y.o. F) Treating RN: Carlene Coria Primary Care Provider: Sallee Lange Other Clinician: Jeanine Luz Referring Provider: Sallee Lange Treating Provider/Extender: Skipper Cliche in Treatment: 0 Information Obtained from: Patient Chief Complaint Bilateral LE Ulcers Electronic Signature(s) Signed: 09/15/2020 2:07:42 PM By: Worthy Keeler PA-C Entered By: Worthy Keeler on 09/15/2020 14:07:42 [name] ([id number]) -------------------------------------------------------------------------------- Debridement Details Patient Name: [name]. Date of Service: 09/15/2020 1:00 PM Medical Record Number: [id number] Patient Account Number: [id number] Date of Birth/Sex: 1926-12-01 (87 y.o. F) Treating RN: Carlene Coria Primary Care Provider: Sallee Lange Other Clinician: Jeanine Luz Referring Provider: Sallee Lange Treating Provider/Extender: Skipper Cliche in Treatment: 0 Debridement Performed for Wound #1 Left,Medial,Anterior Lower Leg Assessment: Performed By: Physician Tommie Sams., PA-C Debridement Type: Chemical/Enzymatic/Mechanical Agent Used: normal saline and gauze Severity of Tissue Pre Debridement: Fat layer exposed Level of Consciousness (Pre- Awake and Alert procedure): Pre-procedure Verification/Time Out Yes - 14:10 Taken: Start Time: 14:10 Pain Control: Lidocaine 4% Topical Solution Instrument: Other : gauze and normal saline Bleeding: Minimum Hemostasis Achieved: Pressure End Time: 14:18 Procedural Pain: 3 Post Procedural Pain: 0 Response to Treatment: Procedure was tolerated well Level of Consciousness (Post- Awake and Alert procedure): Post Debridement Measurements  of Total Wound Length: (cm) 2 Width: (cm) 1.8 Depth: (cm) 0.1 Volume: (cm) 0.283 Character of Wound/Ulcer Post Debridement: Improved Severity of Tissue Post Debridement: Fat layer exposed Post Procedure Diagnosis Same as Pre-procedure Electronic Signature(s) Signed: 09/15/2020 4:31:50 PM By: Worthy Keeler PA-C Signed: 09/15/2020 5:13:28 PM By: Carlene Coria RN Entered By: Carlene Coria on 09/15/2020 14:19:26 [name] ([id number]) -------------------------------------------------------------------------------- Debridement Details Patient Name: [name]. Date of Service: 09/15/2020 1:00 PM Medical Record Number: [id number] Patient Account Number: [id number] Date of Birth/Sex: [birth year] (87 y.o. F) Treating RN: Carlene Coria Primary Care Provider: Sallee Lange Other Clinician: Jeanine Luz Referring Provider: Sallee Lange Treating Provider/Extender: Skipper Cliche in Treatment: 0 Debridement Performed for Wound #2 Right,Medial,Anterior Lower Leg Assessment: Performed By: Physician Tommie Sams., PA-C Debridement Type: Chemical/Enzymatic/Mechanical Agent Used: normal saline and gauze Severity of Tissue Pre Debridement: Fat layer exposed Level of Consciousness (Pre- Awake and Alert procedure): Pre-procedure Verification/Time Out Yes - 14:10 Taken: Start Time: 14:10 Pain Control: Lidocaine 4% Topical Solution Instrument: Other : gauze and normal saline Bleeding: Minimum Hemostasis Achieved: Pressure End Time: 14:18 Procedural Pain: 3 Post Procedural Pain: 0 Response to Treatment: Procedure was tolerated well Level of Consciousness (Post- Awake and Alert procedure): Post Debridement Measurements of Total Wound Length: (cm) 8.5 Width: (cm) 7 Depth: (cm) 0.1 Volume: (cm) 4.673 Character of Wound/Ulcer Post Debridement: Improved Severity of Tissue Post Debridement: Fat layer exposed Post Procedure Diagnosis Same as Pre-procedure Electronic  Signature(s) Signed: 09/15/2020 4:31:50 PM By: Worthy Keeler PA-C Signed: 09/15/2020 5:13:28 PM By: Carlene Coria RN Entered By: Carlene Coria on 09/15/2020 14:20:36 [name] ([id number]) -------------------------------------------------------------------------------- HPI Details Patient Name: [name]. Date of Service: 09/15/2020 1:00 PM Medical Record Number: [id number] Patient Account Number: [id number] Date of Birth/Sex: [birth year] (87 y.o. F) Treating RN: Carlene Coria Primary Care Provider: Sallee Lange Other Clinician: Jeanine Luz Referring Provider: Sallee Lange Treating Provider/Extender: Skipper Cliche in Treatment: 0 History of Present Illness HPI Description: 09/15/20 upon evaluation today patient presents for initial inspection here in  our clinic concerning issues she has been having with blisters on the lower extremities bilaterally. She tells me that this began around the beginning of February. With that being said she does have a longstanding history of lymphedema she tells me. She also has chronic kidney disease stage IV which is causing some swelling as well her nephrologist did place her recently on Lasix which has helped to some degree. Her wounds actually appear to be healing which is great news. The patient does have a history of chronic venous insufficiency, lymphedema, diabetes mellitus type 2 which is controlled with diet she is not on any medications her last hemoglobin A1c was 5.6. She also has atrial fibrillation, cardiac pacemaker, and again stage IV kidney disease. Electronic Signature(s) Signed: 09/15/2020 2:39:31 PM By: Worthy Keeler PA-C Entered By: Worthy Keeler on 09/15/2020 14:39:30 [name] ([id number]) -------------------------------------------------------------------------------- Physical Exam Details Patient Name: [name]. Date of Service: 09/15/2020 1:00 PM Medical Record Number: [id number] Patient  Account Number: [id number] Date of Birth/Sex: November 12, 1926 (87 y.o. F) Treating RN: Carlene Coria Primary Care Provider: Sallee Lange Other Clinician: Jeanine Luz Referring Provider: Sallee Lange Treating Provider/Extender: Skipper Cliche in Treatment: 0 Constitutional patient is hypertensive.. pulse regular and within target range for patient.Marland Kitchen respirations regular, non-labored and within target range for patient.Marland Kitchen temperature within target range for patient.. Well-nourished and well-hydrated in no acute distress. Eyes conjunctiva clear no eyelid edema noted. pupils equal round and reactive to light and accommodation. Ears, Nose, Mouth, and Throat no gross abnormality of ear auricles or external auditory canals. normal hearing noted during conversation. mucus membranes moist. Respiratory normal breathing without difficulty. Cardiovascular 1+ dorsalis pedis/posterior tibialis pulses. 1+ pitting edema of the bilateral lower extremities. Musculoskeletal normal gait and posture. no significant deformity or arthritic changes, no loss or range of motion, no clubbing. Psychiatric this patient is able to make decisions and demonstrates good insight into disease process. Alert and Oriented x 3. pleasant and cooperative. Notes Upon inspection patient's wounds actually showed signs of being very superficial and minimal she seems to be healing quite nicely she does have some edema noted at this point but again this is again seem to be very minimal compared to what it was previous. The wounds are almost completely closed although still open at this time. Fortunately there does not appear to be any signs of infection which is great news. She does appear to have evidence of lymphedema. There is no diminished capillary refill, cyanosis, or any evidence of arterial insufficiency in regard to the lower extremities. Electronic Signature(s) Signed: 09/15/2020 2:40:16 PM By: Worthy Keeler  PA-C Entered By: Worthy Keeler on 09/15/2020 14:40:16 [name] ([id number]) -------------------------------------------------------------------------------- Physician Orders Details Patient Name: [name]. Date of Service: 09/15/2020 1:00 PM Medical Record Number: [id number] Patient Account Number: [id number] Date of Birth/Sex: November 15, 1926 (87 y.o. F) Treating RN: Carlene Coria Primary Care Provider: Sallee Lange Other Clinician: Jeanine Luz Referring Provider: Sallee Lange Treating Provider/Extender: Skipper Cliche in Treatment: 0 Verbal / Phone Orders: No Diagnosis Coding ICD-10 Coding Code Description I89.0 Lymphedema, not elsewhere classified I87.2 Venous insufficiency (chronic) (peripheral) L97.821 Non-pressure chronic ulcer of other part of left lower leg limited to breakdown of skin L97.811 Non-pressure chronic ulcer of other part of right lower leg limited to breakdown of skin E11.622 Type 2 diabetes mellitus with other skin ulcer I48.0 Paroxysmal atrial fibrillation Z95.0 Presence of cardiac pacemaker N18.4 Chronic kidney disease, stage 4 (severe) Follow-up Appointments o Return  Appointment in 1 week. [place] for wound care. May utilize formulary equivalent dressing for wound treatment orders unless otherwise specified. Home Health Nurse may visit [facility] to address patientos wound care needs. - BAYADA Bathing/ Shower/ Hygiene o May shower with wound dressing protected with water repellent cover or cast protector. Edema Control - Lymphedema / Segmental Compressive Device / Other o Elevate, Exercise Daily and Avoid Standing for Long Periods of Time. o Elevate legs to the level of the heart and pump ankles as often as possible o Elevate leg(s) parallel to the floor when sitting. Wound Treatment Wound #1 - Lower Leg Wound Laterality: Left, Medial, Anterior Cleanser: Normal Saline (DME) (Generic) 2 x Per Week/30  Days Discharge Instructions: Wash your hands with soap and water. Remove old dressing, discard into plastic bag and place into trash. Cleanse the wound with Normal Saline prior to applying a clean dressing using gauze sponges, not tissues or cotton balls. Do not scrub or use excessive force. Pat dry using gauze sponges, not tissue or cotton balls. Primary Dressing: Algicell Calcium Alginate Dressing 2x2 (in/in) (Generic) 2 x Per Week/30 Days Discharge Instructions: apply to wound bed Secondary Dressing: ABD Pad 5x9 (in/in) (Generic) 2 x Per Week/30 Days Discharge Instructions: Cover with ABD pad Compression Wrap: Profore Lite LF 3 Multilayer Compression Bandaging System (Generic) 2 x Per Week/30 Days Discharge Instructions: Apply 3 multi-layer wrap as prescribed. Compression Stockings: Jobst Farrow Wrap 4000 (DME) Left Leg Compression Amount: 30-40 mmHG Discharge Instructions: Apply to lower extremity as directed. Wound #2 - Lower Leg Wound Laterality: Right, Medial, Anterior Cleanser: Normal Saline (DME) (Generic) 2 x Per Week/30 Days Discharge Instructions: Wash your hands with soap and water. Remove old dressing, discard into plastic bag and place into trash. Cleanse the wound with Normal Saline prior to applying a clean dressing using gauze sponges, not tissues or cotton balls. Do not scrub or use excessive force. Pat dry using gauze sponges, not tissue or cotton balls. DEZLYN, ABERNATHY ([id number]) Primary Dressing: Algicell Calcium Alginate Dressing, 4X5 (in/in) 2 x Per Week/30 Days Secondary Dressing: ABD Pad 5x9 (in/in) (Generic) 2 x Per Week/30 Days Discharge Instructions: Cover with ABD pad Compression Wrap: Profore Lite LF 3 Multilayer Compression Bandaging System (Generic) 2 x Per Week/30 Days Discharge Instructions: Apply 3 multi-layer wrap as prescribed. Compression Stockings: Jobst Farrow Wrap 4000 (DME) Right Leg Compression Amount: 30-40 mmHG Discharge Instructions: Apply  to lower extremity as directed. Electronic Signature(s) Signed: 10/01/2020 3:51:10 PM By: Carlene Coria RN Signed: 10/07/2020 8:19:13 AM By: Worthy Keeler PA-C Previous Signature: 09/18/2020 12:59:40 PM Version By: Worthy Keeler PA-C Previous Signature: 09/18/2020 3:45:06 PM Version By: Carlene Coria RN Previous Signature: 09/15/2020 4:31:50 PM Version By: Worthy Keeler PA-C Previous Signature: 09/15/2020 5:13:28 PM Version By: Carlene Coria RN Entered By: Carlene Coria on 10/01/2020 15:31:28 [name] ([id number]) -------------------------------------------------------------------------------- Problem List Details Patient Name: JOSSILYN, STUBBS. Date of Service: 09/15/2020 1:00 PM Medical Record Number: [id number] Patient Account Number: [id number] Date of Birth/Sex: [birth year] (87 y.o. F) Treating RN: Carlene Coria Primary Care Provider: Sallee Lange Other Clinician: Jeanine Luz Referring Provider: Sallee Lange Treating Provider/Extender: Skipper Cliche in Treatment: 0 Active Problems ICD-10 Encounter Code Description Active Date MDM Diagnosis I89.0 Lymphedema, not elsewhere classified 09/15/2020 No Yes I87.2 Venous insufficiency (chronic) (peripheral) 09/15/2020 No Yes L97.821 Non-pressure chronic ulcer of other part of left lower leg limited to 09/15/2020 No Yes breakdown of skin L97.811 Non-pressure chronic ulcer  of other part of right lower leg limited to 09/15/2020 No Yes breakdown of skin E11.622 Type 2 diabetes mellitus with other skin ulcer 09/15/2020 No Yes I48.0 Paroxysmal atrial fibrillation 09/15/2020 No Yes Z95.0 Presence of cardiac pacemaker 09/15/2020 No Yes N18.4 Chronic kidney disease, stage 4 (severe) 09/15/2020 No Yes Inactive Problems Resolved Problems Electronic Signature(s) Signed: 09/15/2020 2:07:15 PM By: Worthy Keeler PA-C Entered By: Worthy Keeler on 09/15/2020 14:07:15 [name]  ([id number]) -------------------------------------------------------------------------------- Progress Note Details Patient Name: [name]. Date of Service: 09/15/2020 1:00 PM Medical Record Number: [id number] Patient Account Number: [id number] Date of Birth/Sex: 1926/11/17 (87 y.o. F) Treating RN: Carlene Coria Primary Care Provider: Sallee Lange Other Clinician: Jeanine Luz Referring Provider: Sallee Lange Treating Provider/Extender: Skipper Cliche in Treatment: 0 Subjective Chief Complaint Information obtained from Patient Bilateral LE Ulcers History of Present Illness (HPI) 09/15/20 upon evaluation today patient presents for initial inspection here in our clinic concerning issues she has been having with blisters on the lower extremities bilaterally. She tells me that this began around the beginning of February. With that being said she does have a longstanding history of lymphedema she tells me. She also has chronic kidney disease stage IV which is causing some swelling as well her nephrologist did place her recently on Lasix which has helped to some degree. Her wounds actually appear to be healing which is great news. The patient does have a history of chronic venous insufficiency, lymphedema, diabetes mellitus type 2 which is controlled with diet she is not on any medications her last hemoglobin A1c was 5.6. She also has atrial fibrillation, cardiac pacemaker, and again stage IV kidney disease. Patient History Unable to Obtain Patient History due to Altered Mental Status. Information obtained from Patient. Allergies tramadol (Severity: Moderate, Reaction: SEDATION) Social History Never smoker, Marital Status - Married, Alcohol Use - Never, Drug Use - No History, Caffeine Use - Daily. Medical History Eyes Patient has history of Cataracts - HAD SURGERY LEFT Hematologic/Lymphatic Patient has history of Anemia, Lymphedema Respiratory Denies history of Asthma,  Chronic Obstructive Pulmonary Disease (COPD) Cardiovascular Patient has history of Arrhythmia - AFIB, Congestive Heart Failure, Coronary Artery Disease - HX CVA 2006, Hypertension Endocrine Patient has history of Type II Diabetes - NO MEDS/LAST A1C WAS 5.3 Musculoskeletal Patient has history of Gout, Osteoarthritis Neurologic Denies history of Seizure Disorder Patient is treated with Controlled Diet. Blood sugar is not tested. Review of Systems (ROS) Eyes Complains or has symptoms of Glasses / Contacts. Ear/Nose/Mouth/Throat Denies complaints or symptoms of Difficult clearing ears, Sinusitis. Respiratory Complains or has symptoms of Shortness of Breath - chf. Gastrointestinal Denies complaints or symptoms of Frequent diarrhea, Nausea, Vomiting. Endocrine Complains or has symptoms of Thyroid disease. Genitourinary Complains or has symptoms of Kidney failure/ Dialysis. Immunological Denies complaints or symptoms of Hives, Itching. Integumentary (Skin) Complains or has symptoms of Wounds, Breakdown, Swelling. Psychiatric Denies complaints or symptoms of Anxiety, Claustrophobia. ZAMARA, [id number] ([id number]) Objective Constitutional patient is hypertensive.. pulse regular and within target range for patient.Marland Kitchen respirations regular, non-labored and within target range for patient.Marland Kitchen temperature within target range for patient.. Well-nourished and well-hydrated in no acute distress. Vitals Time Taken: 1:23 PM, Height: 62 in, Source: Stated, Weight: 166 lbs, BMI: 30.4, Temperature: 98.5 F, Pulse: 63 bpm, Respiratory Rate: 18 breaths/min, Blood Pressure: 146/53 mmHg. Eyes conjunctiva clear no eyelid edema noted. pupils equal round and reactive to light and accommodation. Ears, Nose, Mouth, and Throat no gross abnormality of ear auricles or  external auditory canals. normal hearing noted during conversation. mucus membranes moist. Respiratory normal breathing without  difficulty. Cardiovascular 1+ dorsalis pedis/posterior tibialis pulses. 1+ pitting edema of the bilateral lower extremities. Musculoskeletal normal gait and posture. no significant deformity or arthritic changes, no loss or range of motion, no clubbing. Psychiatric this patient is able to make decisions and demonstrates good insight into disease process. Alert and Oriented x 3. pleasant and cooperative. General Notes: Upon inspection patient's wounds actually showed signs of being very superficial and minimal she seems to be healing quite nicely she does have some edema noted at this point but again this is again seem to be very minimal compared to what it was previous. The wounds are almost completely closed although still open at this time. Fortunately there does not appear to be any signs of infection which is great news. She does appear to have evidence of lymphedema. There is no diminished capillary refill, cyanosis, or any evidence of arterial insufficiency in regard to the lower extremities. Integumentary (Hair, Skin) Wound #1 status is Open. Original cause of wound was Blister. The date acquired was: 07/08/2020. The wound is located on the Left,Medial,Anterior Lower Leg. The wound measures 2cm length x 1.8cm width x 0.1cm depth; 2.827cm^2 area and 0.283cm^3 volume. There is Fat Layer (Subcutaneous Tissue) exposed. There is no tunneling or undermining noted. There is a medium amount of serous drainage noted. There is medium (34-66%) red, pink granulation within the wound bed. There is a medium (34-66%) amount of necrotic tissue within the wound bed. Wound #2 status is Open. Original cause of wound was Blister. The date acquired was: 07/08/2020. The wound is located on the Right,Medial,Anterior Lower Leg. The wound measures 8.5cm length x 7cm width x 0.1cm depth; 46.731cm^2 area and 4.673cm^3 volume. There is Fat Layer (Subcutaneous Tissue) exposed. There is no tunneling or undermining noted.  There is a medium amount of serosanguineous drainage noted. There is medium (34-66%) red, pink granulation within the wound bed. There is a medium (34-66%) amount of necrotic tissue within the wound bed including Adherent Slough. 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 ([id number]) Procedures Wound #1 Pre-procedure diagnosis of Wound #1 is a Venous Leg Ulcer located on the Left,Medial,Anterior Lower Leg .Severity of Tissue Pre Debridement is: Fat layer exposed. There was a Chemical/Enzymatic/Mechanical debridement performed by Tommie Sams., PA-C. With the following instrument (s): gauze and normal saline after achieving pain control using Lidocaine 4% Topical Solution. Other agent used was normal saline and gauze. A time out was conducted at 14:10, prior to the start of the procedure. A Minimum amount of bleeding was controlled with Pressure. The procedure was tolerated well with a pain level of 3 throughout and a pain level of 0 following the procedure. Post Debridement Measurements: 2cm length x 1.8cm width x 0.1cm depth; 0.283cm^3 volume. Character of Wound/Ulcer Post Debridement is improved. Severity of Tissue Post Debridement is: Fat layer exposed. Post procedure Diagnosis Wound #1: Same as Pre-Procedure Pre-procedure diagnosis of Wound #1 is a Venous Leg Ulcer located on the Left,Medial,Anterior Lower Leg . There was a Three Layer Compression Therapy Procedure by Carlene Coria, RN. Post  procedure Diagnosis Wound #1: Same as Pre-Procedure Wound #2 Pre-procedure  diagnosis of Wound #2 is a Venous Leg Ulcer located on the Right,Medial,Anterior Lower Leg .Severity of Tissue Pre Debridement is: Fat layer exposed. There was a Chemical/Enzymatic/Mechanical debridement performed by Tommie Sams., PA-C. With the following instrument (s): gauze and normal saline after achieving pain control using Lidocaine 4% Topical Solution. Other agent used was normal saline and gauze. A time out was conducted at 14:10, prior to the start of the procedure. A Minimum amount of bleeding was controlled with Pressure. The procedure was tolerated well with a pain level of 3 throughout and a pain level of 0 following the procedure. Post Debridement Measurements: 8.5cm length x 7cm width x 0.1cm depth; 4.673cm^3 volume. Character of Wound/Ulcer Post Debridement is improved. Severity of Tissue Post Debridement is: Fat layer exposed. Post procedure Diagnosis Wound #2: Same as Pre-Procedure Pre-procedure diagnosis of Wound #2 is a Venous Leg Ulcer located on the Right,Medial,Anterior Lower Leg . There was a Three Layer Compression Therapy Procedure by Carlene Coria, RN. Post procedure Diagnosis Wound #2: Same as Pre-Procedure Plan Follow-up Appointments: Return Appointment in 1 week. Home Health: [facility] for wound care. May utilize formulary equivalent dressing for wound treatment orders unless otherwise specified. Home Health Nurse may visit [facility] to address patient s wound care needs. - [facility] Bathing/ Shower/ Hygiene: May shower with wound dressing protected with water repellent cover or cast protector. Edema Control - Lymphedema / Segmental Compressive Device / Other: Elevate, Exercise Daily and Avoid Standing for Long Periods of Time. Elevate legs to the level of the heart and pump ankles as often as possible Elevate leg(s) parallel to the floor when sitting. WOUND #1: - Lower Leg Wound Laterality: Left, Medial, Anterior Cleanser: Normal Saline (DME) (Generic) 2 x  Per Day/30 Days Discharge Instructions: Wash your hands with soap and water. Remove old dressing, discard into plastic bag and place into trash. Cleanse the wound with Normal Saline prior to applying a clean dressing using gauze sponges, not tissues or cotton balls. Do not scrub or use excessive force. Pat dry using gauze sponges, not tissue or cotton balls. Primary Dressing: Algicell Calcium Alginate Dressing 2x2 (in/in) (Generic) 2 x Per Day/30 Days Discharge Instructions: apply to wound bed Secondary Dressing: ABD Pad 5x9 (in/in) (Generic) 2 x Per Day/30 Days Discharge Instructions: Cover with ABD pad Compression Wrap: Profore Lite LF 3 Multilayer Compression Bandaging System (Generic) 2 x Per Day/30 Days Discharge Instructions: Apply 3 multi-layer wrap as prescribed. Compression Stockings: Jobst Farrow Wrap 4000 (DME) Compression Amount: 30-40 mmHg (left) Discharge Instructions: Apply to lower extremity as directed. WOUND #2: - Lower Leg Wound Laterality: Right, Medial, Anterior Cleanser: Normal Saline (DME) (Generic) 2 x Per Day/30 Days Discharge Instructions: Wash your hands with soap and water. Remove old dressing, discard into plastic bag and place into trash. Cleanse the wound with Normal Saline prior to applying a clean dressing using gauze sponges, not tissues or cotton balls. Do not scrub or use excessive force. Pat dry using gauze sponges, not tissue or cotton balls. Primary Dressing: Algicell Calcium Alginate Dressing, 4X5 (in/in) 2 x Per Day/30 Days Secondary Dressing: ABD Pad 5x9 (in/in) (Generic) 2 x Per Day/30 Days Discharge Instructions: Cover with ABD pad Compression Wrap: Profore Lite LF 3 Multilayer Compression Bandaging System (Generic) 2 x Per Day/30 Days Discharge Instructions: Apply 3 multi-layer wrap as prescribed. Compression Stockings: [employer] Wrap [address] (DME) MACAULEY, BICK Jackson. ([id number]) Compression Amount: 30-40 mmHg (  right) Discharge Instructions:  Apply to lower extremity as directed. 1. Would recommend currently that we go ahead and initiate treatment with a plain alginate dressing to both wound openings I think this will do quite well for her. 2. I am also can recommend that we use an ABD pad to cover. 3. I would also suggest a 3 layer compression wrap which I think will be sufficient to help with edema control and allow these wounds to heal. 4. We will also can order her Velcro compression wraps which I think will also be of benefit. We will see patient back for reevaluation in 1 week here in the clinic. If anything worsens or changes patient will contact our office for additional recommendations. Electronic Signature(s) Signed: 09/15/2020 2:40:47 PM By: Worthy Keeler PA-C Entered By: Worthy Keeler on 09/15/2020 14:40:46 [name] ([id number]) -------------------------------------------------------------------------------- ROS/PFSH Details Patient Name: [name]. Date of Service: 09/15/2020 1:00 PM Medical Record Number: [id number] Patient Account Number: [id number] Date of Birth/Sex: 1926/08/18 (87 y.o. F) Treating RN: Donnamarie Poag Primary Care Provider: Sallee Lange Other Clinician: Jeanine Luz Referring Provider: Sallee Lange Treating Provider/Extender: Skipper Cliche in Treatment: 0 Unable to Obtain Patient History due to oo Altered Mental Status Information Obtained From Patient Eyes Complaints and Symptoms: Positive for: Glasses / Contacts Medical History: Positive for: Cataracts - HAD SURGERY LEFT Ear/Nose/Mouth/Throat Complaints and Symptoms: Negative for: Difficult clearing ears; Sinusitis Respiratory Complaints and Symptoms: Positive for: Shortness of Breath - chf Medical History: Negative for: Asthma; Chronic Obstructive Pulmonary Disease (COPD) Gastrointestinal Complaints and Symptoms: Negative for: Frequent diarrhea; Nausea; Vomiting Endocrine Complaints and  Symptoms: Positive for: Thyroid disease Medical History: Positive for: Type II Diabetes - NO MEDS/LAST A1C WAS 5.3 Time with diabetes: UNKNOWN Treated with: Diet Blood sugar tested every day: No Genitourinary Complaints and Symptoms: Positive for: Kidney failure/ Dialysis Immunological Complaints and Symptoms: Negative for: Hives; Itching Integumentary (Skin) Complaints and Symptoms: Positive for: Wounds; Breakdown; Swelling CAROLE, KRILL ([id number]) Psychiatric Complaints and Symptoms: Negative for: Anxiety; Claustrophobia Hematologic/Lymphatic Medical History: Positive for: Anemia; Lymphedema Cardiovascular Medical History: Positive for: Arrhythmia - AFIB; Congestive Heart Failure; Coronary Artery Disease - HX CVA 2006; Hypertension Musculoskeletal Medical History: Positive for: Gout; Osteoarthritis Neurologic Medical History: Negative for: Seizure Disorder Oncologic HBO Extended History Items Eyes: Cataracts Immunizations Pneumococcal Vaccine: Received Pneumococcal Vaccination: Yes Implantable Devices Yes Family and Social History Never smoker; Marital Status - Married; Alcohol Use: Never; Drug Use: No History; Caffeine Use: Daily; Financial Concerns: No; Food, Clothing or Shelter Needs: No; Support System Lacking: No; Transportation Concerns: No Electronic Signature(s) Signed: 09/15/2020 4:31:50 PM By: Worthy Keeler PA-C Signed: 09/15/2020 4:36:12 PM By: Donnamarie Poag Entered By: Donnamarie Poag on 09/15/2020 13:46:27 [name] ([id number]) -------------------------------------------------------------------------------- SuperBill Details Patient Name: [name]. Date of Service: 09/15/2020 Medical Record Number: [id number] Patient Account Number: [id number] Date of Birth/Sex: 11/10/1926 (87 y.o. F) Treating RN: Carlene Coria Primary Care Provider: Sallee Lange Other Clinician: Jeanine Luz Referring Provider: Sallee Lange Treating  Provider/Extender: Skipper Cliche in Treatment: 0 Diagnosis Coding ICD-10 Codes Code Description I89.0 Lymphedema, not elsewhere classified I87.2 Venous insufficiency (chronic) (peripheral) L97.821 Non-pressure chronic ulcer of other part of left lower leg limited to breakdown of skin L97.811 Non-pressure chronic ulcer of other part of right lower leg limited to breakdown of skin E11.622 Type 2 diabetes mellitus with other skin ulcer I48.0 Paroxysmal atrial fibrillation Z95.0 Presence of cardiac pacemaker N18.4 Chronic kidney disease, stage 4 (severe) Facility Procedures  CPT4 Code: [id number] Description: [place] VISIT-LEV 3 EST PT Modifier: 25 Quantity: 1 Physician Procedures CPT4 Code: [id number] Description: WC PHYS LEVEL 3 o NEW PT Modifier: Quantity: 1 CPT4 Code: Description: ICD-10 Diagnosis Description I89.0 Lymphedema, not elsewhere classified I87.2 Venous insufficiency (chronic) (peripheral) L97.821 Non-pressure chronic ulcer of other part of left lower leg limited to br L97.811 Non-pressure chronic ulcer of  other part of right lower leg limited to b Modifier: eakdown of skin reakdown of skin Quantity: Electronic Signature(s) Signed: 09/15/2020 2:40:59 PM By: Worthy Keeler PA-C Entered By: Worthy Keeler on 09/15/2020 14:40:59

## 2020-09-15 NOTE — Progress Notes (Signed)
STEPHANYE, SCOGIN ([id number]) Visit Report for 09/15/2020 Abuse/Suicide Risk Screen Details Patient Name: [name], [name]. Date of Service: 09/15/2020 1:00 PM Medical Record Number: [id number] Patient Account Number: [id number] Date of Birth/Sex: 10-31-1926 (87 y.o. F) Treating RN: Donnamarie Poag Primary Care Ladarian Bonczek: Sallee Lange Other Clinician: Jeanine Luz Referring Burhan Barham: Sallee Lange Treating Jessup Ogas/Extender: Skipper Cliche in Treatment: 0 Abuse/Suicide Risk Screen Items Answer ABUSE RISK SCREEN: Has anyone close to you tried to hurt or harm you recentlyo No Do you feel uncomfortable with anyone in your familyo No Has anyone forced you do things that you didnot want to doo No Electronic Signature(s) Signed: 09/15/2020 4:36:12 PM By: Donnamarie Poag Entered By: Donnamarie Poag on 09/15/2020 13:46:38 [name] ([id number]) -------------------------------------------------------------------------------- Activities of Daily Living Details Patient Name: [name]. Date of Service: 09/15/2020 1:00 PM Medical Record Number: [id number] Patient Account Number: [id number] Date of Birth/Sex: [birth year] (87 y.o. F) Treating RN: Donnamarie Poag Primary Care Kayce Chismar: Sallee Lange Other Clinician: Jeanine Luz Referring Lamisha Roussell: Sallee Lange Treating Harly Pipkins/Extender: Skipper Cliche in Treatment: 0 Activities of Daily Living Items Answer Activities of Daily Living (Please select one for each item) Drive Automobile Need Assistance Take Medications Need Assistance Use Telephone Need Assistance Care for Appearance Need Assistance Use Toilet Completely Psychologist, forensic / Shower Need Assistance Dress Self Completely Able Feed Self Completely Able Walk Need Assistance Get In / Out Bed Completely [place] Need Assistance Shop for Self Need Assistance Electronic Signature(s) Signed: 09/15/2020  4:36:12 PM By: Donnamarie Poag Entered ByDonnamarie Poag on 09/15/2020 13:47:38 [name] ([id number]) -------------------------------------------------------------------------------- Education Screening Details Patient Name: [name]. Date of Service: 09/15/2020 1:00 PM Medical Record Number: [id number] Patient Account Number: [id number] Date of Birth/Sex: June 21, 1926 (87 y.o. F) Treating RN: Donnamarie Poag Primary Care Keoni Havey: Sallee Lange Other Clinician: Jeanine Luz Referring Giulio Bertino: Sallee Lange Treating Fahmida Jurich/Extender: Skipper Cliche in Treatment: 0 Learning Preferences/Education Level/Primary Language Learning Preference: Explanation Highest Education Level: High School Preferred Language: English Cognitive Barrier Language Barrier: No Translator Needed: No Memory Deficit: No Emotional Barrier: No Cultural/Religious Beliefs Affecting Medical Care: No Physical Barrier Impaired Vision: No Impaired Hearing: No Decreased Hand dexterity: No Knowledge/Comprehension Knowledge Level: Medium Comprehension Level: Medium Ability to understand written instructions: High Ability to understand verbal instructions: High Motivation Anxiety Level: Calm Cooperation: Cooperative Education Importance: Acknowledges Need Interest in Health Problems: Asks Questions Perception: Coherent Willingness to Engage in Self-Management High Activities: Readiness to Engage in Self-Management High Activities: Electronic Signature(s) Signed: 09/15/2020 4:36:12 PM By: Donnamarie Poag Entered By: Donnamarie Poag on 09/15/2020 13:48:12 [name] ([id number]) -------------------------------------------------------------------------------- Fall Risk Assessment Details Patient Name: [name]. Date of Service: 09/15/2020 1:00 PM Medical Record Number: [id number] Patient Account Number: [id number] Date of Birth/Sex: [birth year] (87 y.o. F) Treating RN: Donnamarie Poag Primary Care Donyetta Ogletree: Sallee Lange Other Clinician: Jeanine Luz Referring Matteson Blue: Sallee Lange Treating Autym Siess/Extender: Skipper Cliche in Treatment: 0 Fall Risk Assessment Items Have you had 2 or more falls in the last 12 monthso 0 No Have you had any fall that resulted in injury in the last 12 monthso 0 No FALLS RISK SCREEN History of falling - immediate or within 3 months 0 No Secondary diagnosis (Do you have 2 or more medical diagnoseso) 0 No Ambulatory aid None/bed rest/wheelchair/nurse 0 No Crutches/cane/walker 15 Yes Furniture 0 No Intravenous therapy Access/Saline/Heparin Lock 0 No Gait/Transferring Normal/ bed rest/ wheelchair 0 No Weak (short steps with  or without shuffle, stooped but able to lift head while walking, may 10 Yes seek support from furniture) Impaired (short steps with shuffle, may have difficulty arising from chair, head down, impaired 0 No balance) Mental Status Oriented to own ability 0 No Electronic Signature(s) Signed: 09/15/2020 4:36:12 PM By: Donnamarie Poag Entered By: Donnamarie Poag on 09/15/2020 13:48:32 [name] ([id number]) -------------------------------------------------------------------------------- Foot Assessment Details Patient Name: [name]. Date of Service: 09/15/2020 1:00 PM Medical Record Number: [id number] Patient Account Number: [id number] Date of Birth/Sex: 1927/04/24 (87 y.o. F) Treating RN: Donnamarie Poag Primary Care Sonakshi Rolland: Sallee Lange Other Clinician: Jeanine Luz Referring Miku Udall: Sallee Lange Treating Makynzee Tigges/Extender: Skipper Cliche in Treatment: 0 Foot Assessment Items Site Locations + = Sensation present, - = Sensation absent, C = Callus, U = Ulcer R = Redness, W = Warmth, M = Maceration, PU = Pre-ulcerative lesion F = Fissure, S = Swelling, D = Dryness Assessment Right: Left: Other Deformity: No No Prior Foot Ulcer: No No Prior Amputation: No No Charcot Joint: No  No Ambulatory Status: Ambulatory Without Help Gait: Steady Electronic Signature(s) Signed: 09/15/2020 4:36:12 PM By: Donnamarie Poag Entered ByDonnamarie Poag on 09/15/2020 13:49:39 [name] ([id number]) -------------------------------------------------------------------------------- Nutrition Risk Screening Details Patient Name: [name]. Date of Service: 09/15/2020 1:00 PM Medical Record Number: [id number] Patient Account Number: [id number] Date of Birth/Sex: September 13, 1926 (87 y.o. F) Treating RN: Donnamarie Poag Primary Care Ikesha Siller: Sallee Lange Other Clinician: Jeanine Luz Referring Kalle Bernath: Sallee Lange Treating Cheyne Bungert/Extender: Skipper Cliche in Treatment: 0 Height (in): 62 Weight (lbs): 166 Body Mass Index (BMI): 30.4 Nutrition Risk Screening Items Score Screening NUTRITION RISK SCREEN: I have an illness or condition that made me change the kind and/or amount of food I eat 0 No I eat fewer than two meals per day 0 No I eat few fruits and vegetables, or milk products 0 No I have three or more drinks of beer, liquor or wine almost every day 0 No I have tooth or mouth problems that make it hard for me to eat 0 No I don't always have enough money to buy the food I need 0 No I eat alone most of the time 0 No I take three or more different prescribed or over-the-counter drugs a day 1 Yes Without wanting to, I have lost or gained 10 pounds in the last six months 0 No I am not always physically able to shop, cook and/or feed myself 0 No Nutrition Protocols Good Risk Protocol 0 No interventions needed Moderate Risk Protocol High Risk Proctocol Risk Level: Good Risk Score: 1 Electronic Signature(s) Signed: 09/15/2020 4:36:12 PM By: Donnamarie Poag Entered ByDonnamarie Poag on 09/15/2020 13:48:59

## 2020-09-15 NOTE — Progress Notes (Signed)
[name], [name] ([id number]) Visit Report for 09/15/2020 Allergy List Details Patient Name: [name], [name]. Date of Service: 09/15/2020 1:00 PM Medical Record Number: [id number] Patient Account Number: [id number] Date of Birth/Sex: Aug 12, 1926 (87 y.o. F) Treating RN: Donnamarie Poag Primary Care Abdulhadi Stopa: Sallee Lange Other Clinician: Jeanine Luz Referring Meha Vidrine: Sallee Lange Treating Tamella Tuccillo/Extender: Jeri Cos Weeks in Treatment: 0 Allergies Active Allergies tramadol Reaction: SEDATION Severity: Moderate Allergy Notes Electronic Signature(s) Signed: 09/15/2020 4:36:12 PM By: Donnamarie Poag Entered By: Donnamarie Poag on 09/15/2020 13:41:52 [name] ([id number]) -------------------------------------------------------------------------------- Arrival Information Details Patient Name: [name]. Date of Service: 09/15/2020 1:00 PM Medical Record Number: [id number] Patient Account Number: [id number] Date of Birth/Sex: Dec 23, 1926 (87 y.o. F) Treating RN: Donnamarie Poag Primary Care Adriena Manfre: Sallee Lange Other Clinician: Jeanine Luz Referring Kealii Thueson: Sallee Lange Treating Veneda Kirksey/Extender: Skipper Cliche in Treatment: 0 Visit Information Patient Arrived: Wheel Chair Arrival Time: 13:19 Accompanied By: daughter Transfer Assistance: EasyPivot Patient Lift Patient Identification Verified: Yes Secondary Verification Process Yes Completed: Patient Has Alerts: Yes Patient Alerts: Patient on Blood Thinner on COUMADIN DIABETIC NON COMPRESSIBLE BIL LEGS Electronic Signature(s) Signed: 09/15/2020 3:56:58 PM By: Donnamarie Poag Entered By: Donnamarie Poag on 09/15/2020 15:56:58 [name] ([id number]) -------------------------------------------------------------------------------- Clinic Level of Care Assessment Details Patient Name: [name]. Date of Service: 09/15/2020 1:00 PM Medical Record Number: [id number] Patient Account Number:  [id number] Date of Birth/Sex: 02/26/1927 (87 y.o. F) Treating RN: Carlene Coria Primary Care Natane Heward: Sallee Lange Other Clinician: Jeanine Luz Referring Khale Nigh: Sallee Lange Treating Glorious Flicker/Extender: Skipper Cliche in Treatment: 0 Clinic Level of Care Assessment Items TOOL 1 Quantity Score X - Use when EandM and Procedure is performed on INITIAL visit 1 0 ASSESSMENTS - Nursing Assessment / Reassessment X - General Physical Exam (combine w/ comprehensive assessment (listed just below) when performed on new 1 20 pt. evals) X- 1 25 Comprehensive Assessment (HX, ROS, Risk Assessments, Wounds Hx, etc.) ASSESSMENTS - Wound and Skin Assessment / Reassessment '[]'$  - Dermatologic / Skin Assessment (not related to wound area) 0 ASSESSMENTS - Ostomy and/or Continence Assessment and Care '[]'$  - Incontinence Assessment and Management 0 '[]'$  - 0 Ostomy Care Assessment and Management (repouching, etc.) PROCESS - Coordination of Care '[]'$  - Simple Patient / Family Education for ongoing care 0 X- 1 20 Complex (extensive) Patient / Family Education for ongoing care '[]'$  - 0 Staff obtains Programmer, systems, Records, Test Results / Process Orders '[]'$  - 0 Staff telephones HHA, Nursing Homes / Clarify orders / etc '[]'$  - 0 Routine Transfer to another Facility (non-emergent condition) '[]'$  - 0 Routine Hospital Admission (non-emergent condition) '[]'$  - 0 New Admissions / Biomedical engineer / Ordering NPWT, Apligraf, etc. '[]'$  - 0 Emergency Hospital Admission (emergent condition) PROCESS - Special Needs '[]'$  - Pediatric / Minor Patient Management 0 '[]'$  - 0 Isolation Patient Management '[]'$  - 0 Hearing / Language / Visual special needs '[]'$  - 0 Assessment of Community assistance (transportation, D/C planning, etc.) '[]'$  - 0 Additional assistance / Altered mentation '[]'$  - 0 Support Surface(s) Assessment (bed, cushion, seat, etc.) INTERVENTIONS - Miscellaneous '[]'$  - External ear exam 0 '[]'$  - 0 Patient Transfer  (multiple staff / Civil Service fast streamer / Similar devices) '[]'$  - 0 Simple Staple / Suture removal (25 or less) '[]'$  - 0 Complex Staple / Suture removal (26 or more) '[]'$  - 0 Hypo/Hyperglycemic Management (do not check if billed separately) X- 1 15 Ankle / Brachial Index (ABI) - do not check if billed separately Has the patient been  seen at the hospital within the last three years: Yes Total Score: 80 Level Of Care: New/Established - Level 3 [name], [name] ([id number]) Electronic Signature(s) Signed: 09/15/2020 5:13:28 PM By: Carlene Coria RN Entered By: Carlene Coria on 09/15/2020 14:29:12 [name] ([id number]) -------------------------------------------------------------------------------- Compression Therapy Details Patient Name: [name]. Date of Service: 09/15/2020 1:00 PM Medical Record Number: [id number] Patient Account Number: [id number] Date of Birth/Sex: 01/14/1927 (87 y.o. F) Treating RN: Carlene Coria Primary Care Brock Larmon: Sallee Lange Other Clinician: Jeanine Luz Referring Jameela Michna: Sallee Lange Treating Artemis Koller/Extender: Skipper Cliche in Treatment: 0 Compression Therapy Performed for Wound Assessment: Wound #1 Left,Medial,Anterior Lower Leg Performed By: Jake Church, RN Compression Type: Three Layer Post Procedure Diagnosis Same as Pre-procedure Electronic Signature(s) Signed: 09/15/2020 5:13:28 PM By: Carlene Coria RN Entered By: Carlene Coria on 09/15/2020 14:27:57 [name] ([id number]) -------------------------------------------------------------------------------- Compression Therapy Details Patient Name: [name]. Date of Service: 09/15/2020 1:00 PM Medical Record Number: [id number] Patient Account Number: [id number] Date of Birth/Sex: 1927/01/01 (87 y.o. F) Treating RN: Carlene Coria Primary Care Manan Olmo: Sallee Lange Other Clinician: Jeanine Luz Referring Hakiem Malizia: Sallee Lange Treating Timoth Schara/Extender:  Skipper Cliche in Treatment: 0 Compression Therapy Performed for Wound Assessment: Wound #2 Right,Medial,Anterior Lower Leg Performed By: Jake Church, RN Compression Type: Three Layer Post Procedure Diagnosis Same as Pre-procedure Electronic Signature(s) Signed: 09/15/2020 5:13:28 PM By: Carlene Coria RN Entered By: Carlene Coria on 09/15/2020 14:27:57 [name] ([id number]) -------------------------------------------------------------------------------- Encounter Discharge Information Details Patient Name: [name]. Date of Service: 09/15/2020 1:00 PM Medical Record Number: [id number] Patient Account Number: [id number] Date of Birth/Sex: Dec 28, 1926 (87 y.o. F) Treating RN: Dolan Amen Primary Care Mazi Brailsford: Sallee Lange Other Clinician: Jeanine Luz Referring Jermar Colter: Sallee Lange Treating Kenslee Achorn/Extender: Skipper Cliche in Treatment: 0 Encounter Discharge Information Items Post Procedure Vitals Discharge Condition: Stable Temperature (F): 98.5 Ambulatory Status: Wheelchair Pulse (bpm): 63 Discharge Destination: Home Respiratory Rate (breaths/min): 18 Transportation: Private Auto Blood Pressure (mmHg): 146/53 Accompanied By: daughter Schedule Follow-up Appointment: Yes Clinical Summary of Care: Electronic Signature(s) Signed: 09/15/2020 3:58:09 PM By: Georges Mouse, Minus Breeding RN Entered By: Georges Mouse, Kenia on 09/15/2020 14:52:48 [name] ([id number]) -------------------------------------------------------------------------------- Lower Extremity Assessment Details Patient Name: [name]. Date of Service: 09/15/2020 1:00 PM Medical Record Number: [id number] Patient Account Number: [id number] Date of Birth/Sex: [birth year] (87 y.o. F) Treating RN: Donnamarie Poag Primary Care Kamil Hanigan: Sallee Lange Other Clinician: Jeanine Luz Referring Elim Peale: Sallee Lange Treating Damarian Priola/Extender: Jeri Cos Weeks  in Treatment: 0 Edema Assessment Assessed: [Left: Yes] [Right: Yes] Edema: [Left: Yes] [Right: Yes] Calf Left: Right: Point of Measurement: 31 cm From Medial Instep 33.4 cm 37 cm Ankle Left: Right: Point of Measurement: 9 cm From Medial Instep 22.6 cm 20.6 cm Vascular Assessment Pulses: Dorsalis Pedis Palpable: [Left:No Yes] [Right:No Yes] Electronic Signature(s) Signed: 09/15/2020 4:36:12 PM By: Donnamarie Poag Entered ByDonnamarie Poag on 09/15/2020 13:58:53 [name] ([id number]) -------------------------------------------------------------------------------- Multi Wound Chart Details Patient Name: [name]. Date of Service: 09/15/2020 1:00 PM Medical Record Number: [id number] Patient Account Number: [id number] Date of Birth/Sex: 11/22/1926 (87 y.o. F) Treating RN: Carlene Coria Primary Care Abbe Bula: Sallee Lange Other Clinician: Jeanine Luz Referring [place] Flett: Sallee Lange Treating Bartlomiej Jenkinson/Extender: Skipper Cliche in Treatment: 0 Vital Signs Height(in): 62 Pulse(bpm): 88 Weight(lbs): 166 Blood Pressure(mmHg): 146/53 Body Mass Index(BMI): 30 Temperature(F): 98.5 Respiratory Rate(breaths/min): 18 Photos: [N/A:N/A] Wound Location: Left, Medial, Anterior Lower Leg Right, Medial, Anterior Lower Leg N/A Wounding Event: Blister Blister N/A  Primary Etiology: Lymphedema Lymphedema N/A Date Acquired: 07/08/2020 07/08/2020 N/A Weeks of Treatment: 0 0 N/A Wound Status: Open Open N/A Measurements L x W x D (cm) 2x1.8x1 8.5x7x0.1 N/A Area (cm) : 2.827 46.731 N/A Volume (cm) : 2.827 4.673 N/A Classification: Full Thickness Without Exposed Full Thickness Without Exposed N/A Support Structures Support Structures Exudate Amount: Medium Medium N/A Exudate Type: Serous Serosanguineous N/A Exudate Color: amber red, brown N/A Granulation Amount: Medium (34-66%) Medium (34-66%) N/A Granulation Quality: Red, Pink Red, Pink N/A Necrotic Amount: Medium (34-66%)  Medium (34-66%) N/A Exposed Structures: Fat Layer (Subcutaneous Tissue): Fat Layer (Subcutaneous Tissue): N/A Yes Yes Fascia: No Fascia: No Tendon: No Tendon: No Muscle: No Muscle: No Joint: No Joint: No Bone: No Bone: No Epithelialization: None N/A N/A Treatment Notes Electronic Signature(s) Signed: 09/15/2020 5:13:28 PM By: Carlene Coria RN Entered By: Carlene Coria on 09/15/2020 14:12:39 [name] ([id number]) -------------------------------------------------------------------------------- [place] Details Patient Name: [name]. Date of Service: 09/15/2020 1:00 PM Medical Record Number: [id number] Patient Account Number: [id number] Date of Birth/Sex: 16-Jan-1927 (87 y.o. F) Treating RN: Carlene Coria Primary Care Orel Cooler: Sallee Lange Other Clinician: Jeanine Luz Referring Karin Pinedo: Sallee Lange Treating Azariya Freeman/Extender: Skipper Cliche in Treatment: 0 Active Inactive Abuse / Safety / Falls / Self Care Management Nursing Diagnoses: Potential for injury related to falls Goals: Patient will remain injury free related to falls Date Initiated: 09/15/2020 Target Resolution Date: 10/15/2020 Goal Status: Active Interventions: Assess Activities of Daily Living upon admission and as needed Assess fall risk on admission and as needed Assess: immobility, friction, shearing, incontinence upon admission and as needed Assess impairment of mobility on admission and as needed per policy Assess personal safety and home safety (as indicated) on admission and as needed Assess self care needs on admission and as needed Notes: Nutrition Nursing Diagnoses: Impaired glucose control: actual or potential Goals: Patient/caregiver verbalizes understanding of need to maintain therapeutic glucose control per primary care physician Date Initiated: 09/15/2020 Target Resolution Date: 10/15/2020 Goal Status: Active Interventions: Assess HgA1c  results as ordered upon admission and as needed Assess patient nutrition upon admission and as needed per policy Notes: Wound/Skin Impairment Nursing Diagnoses: Knowledge deficit related to ulceration/compromised skin integrity Goals: Patient/caregiver will verbalize understanding of skin care regimen Date Initiated: 09/15/2020 Target Resolution Date: 10/15/2020 Goal Status: Active Ulcer/skin breakdown will have a volume reduction of 30% by week 4 Date Initiated: 09/15/2020 Target Resolution Date: 11/15/2020 Goal Status: Active Ulcer/skin breakdown will have a volume reduction of 50% by week 8 Date Initiated: 09/15/2020 Target Resolution Date: 12/15/2020 Goal Status: Active Ulcer/skin breakdown will have a volume reduction of 80% by week 12 Date Initiated: 09/15/2020 Target Resolution Date: 01/15/2021 [name], [name] ([id number]) Goal Status: Active Ulcer/skin breakdown will heal within 14 weeks Date Initiated: 09/15/2020 Target Resolution Date: 02/15/2021 Goal Status: Active Interventions: Assess patient/caregiver ability to obtain necessary supplies Assess patient/caregiver ability to perform ulcer/skin care regimen upon admission and as needed Assess ulceration(s) every visit Notes: Electronic Signature(s) Signed: 09/15/2020 5:13:28 PM By: Carlene Coria RN Entered By: Carlene Coria on 09/15/2020 14:12:23 [name] ([id number]) -------------------------------------------------------------------------------- Pain Assessment Details Patient Name: [name]. Date of Service: 09/15/2020 1:00 PM Medical Record Number: [id number] Patient Account Number: [id number] Date of Birth/Sex: 10/16/1926 (87 y.o. F) Treating RN: Donnamarie Poag Primary Care Angla Delahunt: Sallee Lange Other Clinician: Jeanine Luz Referring Shuntavia Yerby: Sallee Lange Treating Chrisanne Loose/Extender: Skipper Cliche in Treatment: 0 Active Problems Location of Pain Severity and Description of Pain Patient  Has Paino Yes Site Locations Pain Location: Pain in Ulcers Rate the pain. Current Pain Level: 8 Pain Management and Medication Current Pain Management: Electronic Signature(s) Signed: 09/15/2020 4:36:12 PM By: Donnamarie Poag Entered By: Donnamarie Poag on 09/15/2020 13:21:15 [name] ([id number]) -------------------------------------------------------------------------------- Patient/Caregiver Education Details Patient Name: [name]. Date of Service: 09/15/2020 1:00 PM Medical Record Number: [id number] Patient Account Number: [id number] Date of Birth/Gender: 1926/11/14 (87 y.o. F) Treating RN: Carlene Coria Primary Care Physician: Sallee Lange Other Clinician: Jeanine Luz Referring Physician: Sallee Lange Treating Physician/Extender: Skipper Cliche in Treatment: 0 Education Assessment Education Provided To: Patient Education Topics Provided Wound/Skin Impairment: Methods: Explain/Verbal Responses: State content correctly Electronic Signature(s) Signed: 09/15/2020 5:13:28 PM By: Carlene Coria RN Entered By: Carlene Coria on 09/15/2020 14:29:52 [name] ([id number]) -------------------------------------------------------------------------------- Wound Assessment Details Patient Name: [name]. Date of Service: 09/15/2020 1:00 PM Medical Record Number: [id number] Patient Account Number: [id number] Date of Birth/Sex: 15-Oct-1926 (87 y.o. F) Treating RN: Carlene Coria Primary Care Eleana Tocco: Sallee Lange Other Clinician: Jeanine Luz Referring Serge Main: Sallee Lange Treating Daila Elbert/Extender: Skipper Cliche in Treatment: 0 Wound Status Wound Number: 1 Primary Venous Leg Ulcer Etiology: Wound Location: Left, Medial, Anterior Lower Leg Secondary Lymphedema Wounding Event: Blister Etiology: Date Acquired: 07/08/2020 Wound Open Weeks Of Treatment: 0 Status: Clustered Wound: No Comorbid Cataracts, Anemia, Lymphedema, Arrhythmia,  Congestive History: Heart Failure, Coronary Artery Disease, Hypertension, Type II Diabetes, Gout, Osteoarthritis Photos Wound Measurements Length: (cm) 2 Width: (cm) 1.8 Depth: (cm) 0.1 Area: (cm) 2.827 Volume: (cm) 0.283 % Reduction in Area: 0% % Reduction in Volume: 0% Epithelialization: None Tunneling: No Undermining: No Wound Description Classification: Full Thickness Without Exposed Support Structures Exudate Amount: Medium Exudate Type: Serous Exudate Color: amber Foul Odor After Cleansing: No Slough/Fibrino Yes Wound Bed Granulation Amount: Medium (34-66%) Exposed Structure Granulation Quality: Red, Pink Fascia Exposed: No Necrotic Amount: Medium (34-66%) Fat Layer (Subcutaneous Tissue) Exposed: Yes Tendon Exposed: No Muscle Exposed: No Joint Exposed: No Bone Exposed: No Treatment Notes Wound #1 (Lower Leg) Wound Laterality: Left, Medial, Anterior Cleanser Normal Saline Discharge Instruction: Wash your hands with soap and water. Remove old dressing, discard into plastic bag and place into trash. Cleanse the wound with Normal Saline prior to applying a clean dressing using gauze sponges, not tissues or cotton balls. Do not [name], [name]. ([id number]) scrub or use excessive force. Pat dry using gauze sponges, not tissue or cotton balls. Peri-Wound Care Topical Primary Dressing Algicell Calcium Alginate Dressing 2x2 (in/in) Discharge Instruction: apply to wound bed Secondary Dressing ABD Pad 5x9 (in/in) Discharge Instruction: Cover with ABD pad Secured With Compression Wrap Profore Lite LF 3 Multilayer Compression Bandaging System Discharge Instruction: Apply 3 multi-layer wrap as prescribed. Compression Stockings Jobst Farrow Wrap 4000 Quantity: 1 Left Leg Compression Amount: 30-40 mmHg Discharge Instruction: Apply to lower extremity as directed. Add-Ons Electronic Signature(s) Signed: 09/15/2020 5:13:28 PM By: Carlene Coria RN Entered By: Carlene Coria on 09/15/2020 14:20:01 [name] ([id number]) -------------------------------------------------------------------------------- Wound Assessment Details Patient Name: [name]. Date of Service: 09/15/2020 1:00 PM Medical Record Number: [id number] Patient Account Number: [id number] Date of Birth/Sex: 05/03/1927 (87 y.o. F) Treating RN: Donnamarie Poag Primary Care [place] Searles: Sallee Lange Other Clinician: Jeanine Luz Referring Chelesea Weiand: Sallee Lange Treating Renald Haithcock/Extender: Jeri Cos Weeks in Treatment: 0 Wound Status Wound Number: 2 Primary Venous Leg Ulcer Etiology: Wound Location: Right, Medial, Anterior Lower Leg Secondary Lymphedema Wounding Event: Blister Etiology: Date Acquired: 07/08/2020 Wound Open Weeks Of Treatment: 0 Status:  Clustered Wound: No Comorbid Cataracts, Anemia, Lymphedema, Arrhythmia, Congestive History: Heart Failure, Coronary Artery Disease, Hypertension, Type II Diabetes, Gout, Osteoarthritis Photos Wound Measurements Length: (cm) 8.5 Width: (cm) 7 Depth: (cm) 0.1 Area: (cm) 46.731 Volume: (cm) 4.673 % Reduction in Area: 0% % Reduction in Volume: 0% Tunneling: No Undermining: No Wound Description Classification: Full Thickness Without Exposed Support Structures Exudate Amount: Medium Exudate Type: Serosanguineous Exudate Color: red, brown Foul Odor After Cleansing: No Slough/Fibrino Yes Wound Bed Granulation Amount: Medium (34-66%) Exposed Structure Granulation Quality: Red, Pink Fascia Exposed: No Necrotic Amount: Medium (34-66%) Fat Layer (Subcutaneous Tissue) Exposed: Yes Necrotic Quality: Adherent Slough Tendon Exposed: No Muscle Exposed: No Joint Exposed: No Bone Exposed: No Treatment Notes Wound #2 (Lower Leg) Wound Laterality: Right, Medial, Anterior Cleanser Normal Saline Discharge Instruction: Wash your hands with soap and water. Remove old dressing, discard into plastic bag and place into  trash. Cleanse the wound with Normal Saline prior to applying a clean dressing using gauze sponges, not tissues or cotton balls. Do not [name], [name]. ([id number]) scrub or use excessive force. Pat dry using gauze sponges, not tissue or cotton balls. Peri-Wound Care Topical Primary Dressing Algicell Calcium Alginate Dressing, 4X5 (in/in) Secondary Dressing ABD Pad 5x9 (in/in) Discharge Instruction: Cover with ABD pad Secured With Compression Wrap Profore Lite LF 3 Multilayer Compression Bandaging System Discharge Instruction: Apply 3 multi-layer wrap as prescribed. Compression Stockings Jobst Farrow Wrap 4000 Quantity: 1 Right Leg Compression Amount: 30-40 mmHg Discharge Instruction: Apply to lower extremity as directed. Add-Ons Electronic Signature(s) Signed: 09/15/2020 4:36:12 PM By: Donnamarie Poag Signed: 09/15/2020 5:13:28 PM By: Carlene Coria RN Entered By: Carlene Coria on 09/15/2020 14:17:50 [name] ([id number]) -------------------------------------------------------------------------------- Vitals Details Patient Name: [name]. Date of Service: 09/15/2020 1:00 PM Medical Record Number: [id number] Patient Account Number: [id number] Date of Birth/Sex: 11/07/1926 (87 y.o. F) Treating RN: Donnamarie Poag Primary Care Kaytelynn Scripter: Sallee Lange Other Clinician: Jeanine Luz Referring Jalani Cullifer: Sallee Lange Treating Jilliana Burkes/Extender: Skipper Cliche in Treatment: 0 Vital Signs Time Taken: 13:23 Temperature (F): 98.5 Height (in): 62 Pulse (bpm): 63 Source: Stated Respiratory Rate (breaths/min): 18 Weight (lbs): 166 Blood Pressure (mmHg): 146/53 Body Mass Index (BMI): 30.4 Reference Range: 80 - 120 mg / dl Electronic Signature(s) Signed: 09/15/2020 4:36:12 PM By: Donnamarie Poag Entered ByDonnamarie Poag on 09/15/2020 13:24:13

## 2020-09-16 ENCOUNTER — Ambulatory Visit (INDEPENDENT_AMBULATORY_CARE_PROVIDER_SITE_OTHER): Payer: Medicare HMO

## 2020-09-16 DIAGNOSIS — E11622 Type 2 diabetes mellitus with other skin ulcer: Secondary | ICD-10-CM | POA: Diagnosis not present

## 2020-09-16 DIAGNOSIS — I872 Venous insufficiency (chronic) (peripheral): Secondary | ICD-10-CM | POA: Diagnosis not present

## 2020-09-16 DIAGNOSIS — L97821 Non-pressure chronic ulcer of other part of left lower leg limited to breakdown of skin: Secondary | ICD-10-CM | POA: Diagnosis not present

## 2020-09-16 DIAGNOSIS — L97811 Non-pressure chronic ulcer of other part of right lower leg limited to breakdown of skin: Secondary | ICD-10-CM | POA: Diagnosis not present

## 2020-09-16 DIAGNOSIS — I495 Sick sinus syndrome: Secondary | ICD-10-CM | POA: Diagnosis not present

## 2020-09-17 LAB — CUP PACEART REMOTE DEVICE CHECK
Battery Remaining Longevity: 129 mo
Battery Remaining Percentage: 95.5 %
Battery Voltage: 3.04 V
Brady Statistic RV Percent Paced: 25 %
Date Time Interrogation Session: 20220412152244
Implantable Lead Implant Date: 20100602
Implantable Lead Implant Date: 20100602
Implantable Lead Location: 753859
Implantable Lead Location: 753860
Implantable Pulse Generator Implant Date: 20211011
Lead Channel Impedance Value: 400 Ohm
Lead Channel Pacing Threshold Amplitude: 1 V
Lead Channel Pacing Threshold Pulse Width: 0.4 ms
Lead Channel Sensing Intrinsic Amplitude: 12 mV
Lead Channel Setting Pacing Amplitude: 2.5 V
Lead Channel Setting Pacing Pulse Width: 0.4 ms
Lead Channel Setting Sensing Sensitivity: 2 mV
Pulse Gen Model: 2272
Pulse Gen Serial Number: 6220844

## 2020-09-18 DIAGNOSIS — Z4800 Encounter for change or removal of nonsurgical wound dressing: Secondary | ICD-10-CM | POA: Diagnosis not present

## 2020-09-18 DIAGNOSIS — N1832 Chronic kidney disease, stage 3b: Secondary | ICD-10-CM | POA: Diagnosis not present

## 2020-09-18 DIAGNOSIS — R238 Other skin changes: Secondary | ICD-10-CM | POA: Diagnosis not present

## 2020-09-18 DIAGNOSIS — E1122 Type 2 diabetes mellitus with diabetic chronic kidney disease: Secondary | ICD-10-CM | POA: Diagnosis not present

## 2020-09-18 DIAGNOSIS — I69351 Hemiplegia and hemiparesis following cerebral infarction affecting right dominant side: Secondary | ICD-10-CM | POA: Diagnosis not present

## 2020-09-18 DIAGNOSIS — I4891 Unspecified atrial fibrillation: Secondary | ICD-10-CM | POA: Diagnosis not present

## 2020-09-18 DIAGNOSIS — I130 Hypertensive heart and chronic kidney disease with heart failure and stage 1 through stage 4 chronic kidney disease, or unspecified chronic kidney disease: Secondary | ICD-10-CM | POA: Diagnosis not present

## 2020-09-18 DIAGNOSIS — I509 Heart failure, unspecified: Secondary | ICD-10-CM | POA: Diagnosis not present

## 2020-09-18 DIAGNOSIS — M1030 Gout due to renal impairment, unspecified site: Secondary | ICD-10-CM | POA: Diagnosis not present

## 2020-09-22 ENCOUNTER — Encounter: Payer: Medicare HMO | Admitting: Physician Assistant

## 2020-09-22 ENCOUNTER — Other Ambulatory Visit: Payer: Self-pay

## 2020-09-22 DIAGNOSIS — I69351 Hemiplegia and hemiparesis following cerebral infarction affecting right dominant side: Secondary | ICD-10-CM | POA: Diagnosis not present

## 2020-09-22 DIAGNOSIS — N1832 Chronic kidney disease, stage 3b: Secondary | ICD-10-CM | POA: Diagnosis not present

## 2020-09-22 DIAGNOSIS — I890 Lymphedema, not elsewhere classified: Secondary | ICD-10-CM | POA: Diagnosis not present

## 2020-09-22 DIAGNOSIS — I872 Venous insufficiency (chronic) (peripheral): Secondary | ICD-10-CM | POA: Diagnosis not present

## 2020-09-22 DIAGNOSIS — I509 Heart failure, unspecified: Secondary | ICD-10-CM | POA: Diagnosis not present

## 2020-09-22 DIAGNOSIS — Z4800 Encounter for change or removal of nonsurgical wound dressing: Secondary | ICD-10-CM | POA: Diagnosis not present

## 2020-09-22 DIAGNOSIS — Z950 Presence of cardiac pacemaker: Secondary | ICD-10-CM | POA: Diagnosis not present

## 2020-09-22 DIAGNOSIS — L97811 Non-pressure chronic ulcer of other part of right lower leg limited to breakdown of skin: Secondary | ICD-10-CM | POA: Diagnosis not present

## 2020-09-22 DIAGNOSIS — I4891 Unspecified atrial fibrillation: Secondary | ICD-10-CM | POA: Diagnosis not present

## 2020-09-22 DIAGNOSIS — E1122 Type 2 diabetes mellitus with diabetic chronic kidney disease: Secondary | ICD-10-CM | POA: Diagnosis not present

## 2020-09-22 DIAGNOSIS — E11622 Type 2 diabetes mellitus with other skin ulcer: Secondary | ICD-10-CM | POA: Diagnosis not present

## 2020-09-22 DIAGNOSIS — N184 Chronic kidney disease, stage 4 (severe): Secondary | ICD-10-CM | POA: Diagnosis not present

## 2020-09-22 DIAGNOSIS — L97821 Non-pressure chronic ulcer of other part of left lower leg limited to breakdown of skin: Secondary | ICD-10-CM | POA: Diagnosis not present

## 2020-09-22 DIAGNOSIS — I480 Paroxysmal atrial fibrillation: Secondary | ICD-10-CM | POA: Diagnosis not present

## 2020-09-22 DIAGNOSIS — R238 Other skin changes: Secondary | ICD-10-CM | POA: Diagnosis not present

## 2020-09-22 DIAGNOSIS — I130 Hypertensive heart and chronic kidney disease with heart failure and stage 1 through stage 4 chronic kidney disease, or unspecified chronic kidney disease: Secondary | ICD-10-CM | POA: Diagnosis not present

## 2020-09-22 DIAGNOSIS — M1030 Gout due to renal impairment, unspecified site: Secondary | ICD-10-CM | POA: Diagnosis not present

## 2020-09-22 NOTE — Progress Notes (Addendum)
[name], [name] ([id number]) Visit Report for 09/22/2020 Chief Complaint Document Details Patient Name: UNKNOWN, [name]. Date of Service: 09/22/2020 10:30 AM Medical Record Number: [id number] Patient Account Number: [id number] Date of Birth/Sex: [birth year] (87 y.o. F) Treating RN: Carlene Coria Primary Care Provider: Sallee Lange Other Clinician: Jeanine Luz Referring Provider: Sallee Lange Treating Provider/Extender: Skipper Cliche in Treatment: 1 Information Obtained from: Patient Chief Complaint Bilateral LE Ulcers Electronic Signature(s) Signed: 09/22/2020 10:18:38 AM By: Worthy Keeler PA-C Entered By: Worthy Keeler on 09/22/2020 10:18:38 [name] ([id number]) -------------------------------------------------------------------------------- HPI Details Patient Name: [name]. Date of Service: 09/22/2020 10:30 AM Medical Record Number: [id number] Patient Account Number: [id number] Date of Birth/Sex: 1926-11-24 (87 y.o. F) Treating RN: Carlene Coria Primary Care Provider: Sallee Lange Other Clinician: Jeanine Luz Referring Provider: Sallee Lange Treating Provider/Extender: Skipper Cliche in Treatment: 1 History of Present Illness HPI Description: 09/15/20 upon evaluation today patient presents for initial inspection here in our clinic concerning issues she has been having with blisters on the lower extremities bilaterally. She tells me that this began around the beginning of February. With that being said she does have a longstanding history of lymphedema she tells me. She also has chronic kidney disease stage IV which is causing some swelling as well her nephrologist did place her recently on Lasix which has helped to some degree. Her wounds actually appear to be healing which is great news. The patient does have a history of chronic venous insufficiency, lymphedema, diabetes mellitus type 2 which is controlled with diet she is not on  any medications her last hemoglobin A1c was 5.6. She also has atrial fibrillation, cardiac pacemaker, and again stage IV kidney disease. 09/22/2020 upon evaluation today patient appears to be doing well with regard to her wounds. She has been tolerating the dressing changes without complication. With that being said even in the past week she has made a dramatic improvement even though apparently the home health nurse actually placed her in an Unna boot wrap and not an actual 3 layer compression wrap. I suspect this was due to the fact that the patient did not have the medications needed to appropriately apply the dressings at home. This is fine but the nurse did not contact us to let me know. Electronic Signature(s) Signed: 09/22/2020 12:42:51 PM By: Worthy Keeler PA-C Entered By: Worthy Keeler on 09/22/2020 12:42:51 [name] ([id number]) -------------------------------------------------------------------------------- Physical Exam Details Patient Name: [name]. Date of Service: 09/22/2020 10:30 AM Medical Record Number: [id number] Patient Account Number: [id number] Date of Birth/Sex: 1926-06-13 (87 y.o. F) Treating RN: Carlene Coria Primary Care Provider: Sallee Lange Other Clinician: Jeanine Luz Referring Provider: Sallee Lange Treating Provider/Extender: Skipper Cliche in Treatment: 1 Constitutional Well-nourished and well-hydrated in no acute distress. Respiratory normal breathing without difficulty. Psychiatric this patient is able to make decisions and demonstrates good insight into disease process. Alert and Oriented x 3. pleasant and cooperative. Notes Upon inspection patient's wound bed actually showed signs of being I believe healed on the left leg. I am very pleased in that regard. On the right leg I do think that the patient is also doing much better and measuring much smaller though it still open. Overall very pleased in that regard. Electronic  Signature(s) Signed: 09/22/2020 12:43:09 PM By: Worthy Keeler PA-C Entered By: Worthy Keeler on 09/22/2020 12:43:09 [name] ([id number]) -------------------------------------------------------------------------------- Physician Orders Details Patient Name: [name]. Date of Service: 09/22/2020 10:30 AM  Medical Record Number: [id number] Patient Account Number: [id number] Date of Birth/Sex: 05-21-1927 (87 y.o. F) Treating RN: Carlene Coria Primary Care Provider: Sallee Lange Other Clinician: Jeanine Luz Referring Provider: Sallee Lange Treating Provider/Extender: Skipper Cliche in Treatment: 1 Verbal / Phone Orders: No Diagnosis Coding ICD-10 Coding Code Description I89.0 Lymphedema, not elsewhere classified I87.2 Venous insufficiency (chronic) (peripheral) L97.821 Non-pressure chronic ulcer of other part of left lower leg limited to breakdown of skin L97.811 Non-pressure chronic ulcer of other part of right lower leg limited to breakdown of skin E11.622 Type 2 diabetes mellitus with other skin ulcer I48.0 Paroxysmal atrial fibrillation Z95.0 Presence of cardiac pacemaker N18.4 Chronic kidney disease, stage 4 (severe) Follow-up Appointments o Return Appointment in 1 week. [place] for wound care. May utilize formulary equivalent dressing for wound treatment orders unless otherwise specified. Home Health Nurse may visit [facility] to address patientos wound care needs. - BAYADA Bathing/ Shower/ Hygiene o May shower with wound dressing protected with water repellent cover or cast protector. Edema Control - Lymphedema / Segmental Compressive Device / Other o Elevate, Exercise Daily and Avoid Standing for Long Periods of Time. o Elevate legs to the level of the heart and pump ankles as often as possible o Elevate leg(s) parallel to the floor when sitting. Wound Treatment Wound #1 - Lower Leg Wound Laterality: Left, Medial,  Anterior Cleanser: Normal Saline (Generic) 2 x Per Week/30 Days Discharge Instructions: Wash your hands with soap and water. Remove old dressing, discard into plastic bag and place into trash. Cleanse the wound with Normal Saline prior to applying a clean dressing using gauze sponges, not tissues or cotton balls. Do not scrub or use excessive force. Pat dry using gauze sponges, not tissue or cotton balls. Primary Dressing: Algicell Calcium Alginate Dressing 2x2 (in/in) (Generic) 2 x Per Week/30 Days Discharge Instructions: apply to wound bed Secondary Dressing: ABD Pad 5x9 (in/in) (Generic) 2 x Per Week/30 Days Discharge Instructions: Cover with ABD pad Compression Wrap: Profore Lite LF 3 Multilayer Compression Bandaging System (Generic) 2 x Per Week/30 Days Discharge Instructions: Apply 3 multi-layer wrap as prescribed. Compression Stockings: Jobst Farrow Wrap 4000 Left Leg Compression Amount: 30-40 mmHG Discharge Instructions: Apply to lower extremity as directed. Wound #2 - Lower Leg Wound Laterality: Right, Medial, Anterior Cleanser: Normal Saline (Generic) 2 x Per Week/30 Days Discharge Instructions: Wash your hands with soap and water. Remove old dressing, discard into plastic bag and place into trash. Cleanse the wound with Normal Saline prior to applying a clean dressing using gauze sponges, not tissues or cotton balls. Do not scrub or use excessive force. Pat dry using gauze sponges, not tissue or cotton balls. ARTIE, CANEPA ([id number]) Primary Dressing: Algicell Calcium Alginate Dressing, 4X5 (in/in) 2 x Per Week/30 Days Secondary Dressing: ABD Pad 5x9 (in/in) (Generic) 2 x Per Week/30 Days Discharge Instructions: Cover with ABD pad Compression Wrap: Profore Lite LF 3 Multilayer Compression Bandaging System (Generic) 2 x Per Week/30 Days Discharge Instructions: Apply 3 multi-layer wrap as prescribed. Compression Stockings: Jobst Farrow Wrap 4000 Right Leg Compression Amount:  30-40 mmHG Discharge Instructions: Apply to lower extremity as directed. Electronic Signature(s) Signed: 10/01/2020 3:51:10 PM By: Carlene Coria RN Signed: 10/07/2020 8:19:13 AM By: Worthy Keeler PA-C Previous Signature: 09/22/2020 5:07:44 PM Version By: Worthy Keeler PA-C Previous Signature: 09/23/2020 7:54:04 AM Version By: Carlene Coria RN Entered By: Carlene Coria on 10/01/2020 15:32:17 [name] ([id number]) -------------------------------------------------------------------------------- Problem List Details Patient Name: Philipp Ovens,  Lynnann M. Date of Service: 09/22/2020 10:30 AM Medical Record Number: [id number] Patient Account Number: [id number] Date of Birth/Sex: 10-Nov-1926 (87 y.o. F) Treating RN: Carlene Coria Primary Care Provider: Sallee Lange Other Clinician: Jeanine Luz Referring Provider: Sallee Lange Treating Provider/Extender: Skipper Cliche in Treatment: 1 Active Problems ICD-10 Encounter Code Description Active Date MDM Diagnosis I89.0 Lymphedema, not elsewhere classified 09/15/2020 No Yes I87.2 Venous insufficiency (chronic) (peripheral) 09/15/2020 No Yes L97.821 Non-pressure chronic ulcer of other part of left lower leg limited to 09/15/2020 No Yes breakdown of skin L97.811 Non-pressure chronic ulcer of other part of right lower leg limited to 09/15/2020 No Yes breakdown of skin E11.622 Type 2 diabetes mellitus with other skin ulcer 09/15/2020 No Yes I48.0 Paroxysmal atrial fibrillation 09/15/2020 No Yes Z95.0 Presence of cardiac pacemaker 09/15/2020 No Yes N18.4 Chronic kidney disease, stage 4 (severe) 09/15/2020 No Yes Inactive Problems Resolved Problems Electronic Signature(s) Signed: 09/22/2020 10:18:33 AM By: Worthy Keeler PA-C Entered By: Worthy Keeler on 09/22/2020 10:18:32 [name] ([id number]) -------------------------------------------------------------------------------- Progress Note Details Patient Name: [name]. Date of Service: 09/22/2020 10:30 AM Medical Record Number: [id number] Patient Account Number: [id number] Date of Birth/Sex: 02-20-1927 (87 y.o. F) Treating RN: Carlene Coria Primary Care Provider: Sallee Lange Other Clinician: Jeanine Luz Referring Provider: Sallee Lange Treating Provider/Extender: Skipper Cliche in Treatment: 1 Subjective Chief Complaint Information obtained from Patient Bilateral LE Ulcers History of Present Illness (HPI) 09/15/20 upon evaluation today patient presents for initial inspection here in our clinic concerning issues she has been having with blisters on the lower extremities bilaterally. She tells me that this began around the beginning of February. With that being said she does have a longstanding history of lymphedema she tells me. She also has chronic kidney disease stage IV which is causing some swelling as well her nephrologist did place her recently on Lasix which has helped to some degree. Her wounds actually appear to be healing which is great news. The patient does have a history of chronic venous insufficiency, lymphedema, diabetes mellitus type 2 which is controlled with diet she is not on any medications her last hemoglobin A1c was 5.6. She also has atrial fibrillation, cardiac pacemaker, and again stage IV kidney disease. 09/22/2020 upon evaluation today patient appears to be doing well with regard to her wounds. She has been tolerating the dressing changes without complication. With that being said even in the past week she has made a dramatic improvement even though apparently the home health nurse actually placed her in an Unna boot wrap and not an actual 3 layer compression wrap. I suspect this was due to the fact that the patient did not have the medications needed to appropriately apply the dressings at home. This is fine but the nurse did not contact us to let me know. Objective Constitutional Well-nourished and well-hydrated in no  acute distress. Vitals Time Taken: 10:20 AM, Height: 62 in, Weight: 166 lbs, BMI: 30.4, Temperature: 98.3 F, Pulse: 79 bpm, Respiratory Rate: 18 breaths/min, Blood Pressure: 163/65 mmHg. Respiratory normal breathing without difficulty. Psychiatric this patient is able to make decisions and demonstrates good insight into disease process. Alert and Oriented x 3. pleasant and cooperative. General Notes: Upon inspection patient's wound bed actually showed signs of being I believe healed on the left leg. I am very pleased in that regard. On the right leg I do think that the patient is also doing much better and measuring much smaller though it still open. Overall very  pleased in that regard. Integumentary (Hair, Skin) Wound #1 status is Open. Original cause of wound was Blister. The date acquired was: 07/08/2020. The wound has been in treatment 1 weeks. The wound is located on the Left,Medial,Anterior Lower Leg. The wound measures 0.1cm length x 0.1cm width x 0.1cm depth; 0.008cm^2 area and 0.001cm^3 volume. There is no tunneling or undermining noted. There is a none present amount of drainage noted. There is no granulation within the wound bed. There is no necrotic tissue within the wound bed. Wound #2 status is Open. Original cause of wound was Blister. The date acquired was: 07/08/2020. The wound has been in treatment 1 weeks. The wound is located on the Right,Medial,Anterior Lower Leg. The wound measures 0.7cm length x 0.8cm width x 0.1cm depth; 0.44cm^2 area and 0.044cm^3 volume. There is Fat Layer (Subcutaneous Tissue) exposed. There is no tunneling or undermining noted. There is a medium amount of serosanguineous drainage noted. There is medium (34-66%) red granulation within the wound bed. There is a medium (34-66%) amount of necrotic tissue within the wound bed including Adherent Slough. Assessment LIVVY, DHALIWAL ([id number]) Active Problems ICD-10 Lymphedema, not elsewhere  classified Venous insufficiency (chronic) (peripheral) Non-pressure chronic ulcer of other part of left lower leg limited to breakdown of skin Non-pressure chronic ulcer of other part of right lower leg limited to breakdown of skin Type 2 diabetes mellitus with other skin ulcer Paroxysmal atrial fibrillation Presence of cardiac pacemaker Chronic kidney disease, stage 4 (severe) Procedures Wound #1 Pre-procedure diagnosis of Wound #1 is a Venous Leg Ulcer located on the Left,Medial,Anterior Lower Leg . There was a Three Layer Compression Therapy Procedure by Carlene Coria, RN. Post procedure Diagnosis Wound #1: Same as Pre-Procedure Wound #2 Pre-procedure diagnosis of Wound #2 is a Venous Leg Ulcer located on the Right,Medial,Anterior Lower Leg . There was a Three Layer Compression Therapy Procedure by Carlene Coria, RN. Post procedure Diagnosis Wound #2: Same as Pre-Procedure Plan Follow-up Appointments: Return Appointment in 1 week. Home Health: [facility] for wound care. May utilize formulary equivalent dressing for wound treatment orders unless otherwise specified. Home Health Nurse may visit PRN to address patient s wound care needs. - BAYADA Bathing/ Shower/ Hygiene: May shower with wound dressing protected with water repellent cover or cast protector. Edema Control - Lymphedema / Segmental Compressive Device / Other: Elevate, Exercise Daily and Avoid Standing for Long Periods of Time. Elevate legs to the level of the heart and pump ankles as often as possible Elevate leg(s) parallel to the floor when sitting. WOUND #1: - Lower Leg Wound Laterality: Left, Medial, Anterior Cleanser: Normal Saline (Generic) 2 x Per Week/30 Days Discharge Instructions: Wash your hands with soap and water. Remove old dressing, discard into plastic bag and place into trash. Cleanse the wound with Normal Saline prior to applying a clean dressing using gauze sponges, not tissues or cotton  balls. Do not scrub or use excessive force. Pat dry using gauze sponges, not tissue or cotton balls. Primary Dressing: Algicell Calcium Alginate Dressing 2x2 (in/in) (Generic) 2 x Per Week/30 Days Discharge Instructions: apply to wound bed Secondary Dressing: ABD Pad 5x9 (in/in) (Generic) 2 x Per Week/30 Days Discharge Instructions: Cover with ABD pad Compression Wrap: Profore Lite LF 3 Multilayer Compression Bandaging System (Generic) 2 x Per Week/30 Days Discharge Instructions: Apply 3 multi-layer wrap as prescribed. Compression Stockings: Jobst Farrow Wrap 4000 Compression Amount: 30-40 mmHg (left) Discharge Instructions: Apply to lower extremity as directed. WOUND #2: - Lower Leg  Wound Laterality: Right, Medial, Anterior Cleanser: Normal Saline (Generic) 2 x Per Week/30 Days Discharge Instructions: Wash your hands with soap and water. Remove old dressing, discard into plastic bag and place into trash. Cleanse the wound with Normal Saline prior to applying a clean dressing using gauze sponges, not tissues or cotton balls. Do not scrub or use excessive force. Pat dry using gauze sponges, not tissue or cotton balls. Primary Dressing: Algicell Calcium Alginate Dressing, 4X5 (in/in) 2 x Per Week/30 Days Secondary Dressing: ABD Pad 5x9 (in/in) (Generic) 2 x Per Week/30 Days Discharge Instructions: Cover with ABD pad Compression Stockings: Jobst Farrow Wrap 4000 Compression Amount: 30-40 mmHg (right) Discharge Instructions: Apply to lower extremity as directed. PETRONA, RUCKEL. ([id number]) 1. Would recommend currently that we going to continue with the wound care measures as before and the patient is in agreement with the plan as is her daughter. Both were present during the evaluation today obviously. 2. I am also can recommend that the patient continue to elevate her legs much as possible to help keep edema under control I think that will be good. 3. Were also going to contact the home  health nurse to let them know that if they do not have the appropriate dressings they should just leave our current dressing in place and then we will see how things go from there. We will see patient back for reevaluation in 1 week here in the clinic. If anything worsens or changes patient will contact our office for additional recommendations. Electronic Signature(s) Signed: 09/22/2020 12:43:47 PM By: Worthy Keeler PA-C Entered By: Worthy Keeler on 09/22/2020 12:43:47 [name] ([id number]) -------------------------------------------------------------------------------- SuperBill Details Patient Name: [name]. Date of Service: 09/22/2020 Medical Record Number: [id number] Patient Account Number: [id number] Date of Birth/Sex: 1927/05/10 (87 y.o. F) Treating RN: Carlene Coria Primary Care Provider: Sallee Lange Other Clinician: Jeanine Luz Referring Provider: Sallee Lange Treating Provider/Extender: Skipper Cliche in Treatment: 1 Diagnosis Coding ICD-10 Codes Code Description I89.0 Lymphedema, not elsewhere classified I87.2 Venous insufficiency (chronic) (peripheral) L97.821 Non-pressure chronic ulcer of other part of left lower leg limited to breakdown of skin L97.811 Non-pressure chronic ulcer of other part of right lower leg limited to breakdown of skin E11.622 Type 2 diabetes mellitus with other skin ulcer I48.0 Paroxysmal atrial fibrillation Z95.0 Presence of cardiac pacemaker N18.4 Chronic kidney disease, stage 4 (severe) Facility Procedures CPT4: Description Modifier Quantity Code [id number] BILATERAL: Application of multi-layer venous compression system; leg (below knee), including 1 ankle and foot. Physician Procedures CPT4 Code: [id number] Description: [id number] - WC PHYS LEVEL 3 - EST PT Modifier: Quantity: 1 CPT4 Code: Description: ICD-10 Diagnosis Description I89.0 Lymphedema, not elsewhere classified I87.2 Venous insufficiency (chronic)  (peripheral) L97.821 Non-pressure chronic ulcer of other part of left lower leg limited to bre L97.811 Non-pressure chronic ulcer of  other part of right lower leg limited to br Modifier: akdown of skin eakdown of skin Quantity: Electronic Signature(s) Signed: 09/22/2020 12:44:01 PM By: Worthy Keeler PA-C Entered By: Worthy Keeler on 09/22/2020 12:44:01

## 2020-09-22 NOTE — Progress Notes (Addendum)
[name], [name] ([id number]) Visit Report for 09/22/2020 Arrival Information Details Patient Name: [name], [name]. Date of Service: 09/22/2020 10:30 AM Medical Record Number: [id number] Patient Account Number: [id number] Date of Birth/Sex: 1926/10/16 (87 y.o. F) Treating RN: Dolan Amen Primary Care Trudi Morgenthaler: Sallee Lange Other Clinician: Jeanine Luz Referring Ophia Shamoon: Sallee Lange Treating Zayneb Baucum/Extender: Skipper Cliche in Treatment: 1 Visit Information History Since Last Visit Added or deleted any medications: No Patient Arrived: Wheel Chair Had a fall or experienced change in No Arrival Time: 10:17 activities of daily living that may affect Accompanied By: daughter risk of falls: Transfer Assistance: Manual Hospitalized since last visit: No Patient Identification Verified: Yes Pain Present Now: Yes Secondary Verification Process Yes Completed: Patient Has Alerts: Yes Patient Alerts: Patient on Blood Thinner on COUMADIN DIABETIC NON COMPRESSIBLE BIL LEGS Electronic Signature(s) Signed: 09/22/2020 12:00:51 PM By: Georges Mouse, Minus Breeding RN Entered By: Georges Mouse, Minus Breeding on 09/22/2020 10:20:02 [name] ([id number]) -------------------------------------------------------------------------------- Clinic Level of Care Assessment Details Patient Name: [name]. Date of Service: 09/22/2020 10:30 AM Medical Record Number: [id number] Patient Account Number: [id number] Date of Birth/Sex: 1927/03/27 (87 y.o. F) Treating RN: Carlene Coria Primary Care Lexxi Koslow: Sallee Lange Other Clinician: Jeanine Luz Referring Deakon Frix: Sallee Lange Treating Kinslie Hove/Extender: Skipper Cliche in Treatment: 1 Clinic Level of Care Assessment Items TOOL 1 Quantity Score '[]'$  - Use when EandM and Procedure is performed on INITIAL visit 0 ASSESSMENTS - Nursing Assessment / Reassessment '[]'$  - General Physical Exam (combine w/ comprehensive assessment  (listed just below) when performed on new 0 pt. evals) '[]'$  - 0 Comprehensive Assessment (HX, ROS, Risk Assessments, Wounds Hx, etc.) ASSESSMENTS - Wound and Skin Assessment / Reassessment '[]'$  - Dermatologic / Skin Assessment (not related to wound area) 0 ASSESSMENTS - Ostomy and/or Continence Assessment and Care '[]'$  - Incontinence Assessment and Management 0 '[]'$  - 0 Ostomy Care Assessment and Management (repouching, etc.) PROCESS - Coordination of Care '[]'$  - Simple Patient / Family Education for ongoing care 0 '[]'$  - 0 Complex (extensive) Patient / Family Education for ongoing care '[]'$  - 0 Staff obtains Programmer, systems, Records, Test Results / Process Orders '[]'$  - 0 Staff telephones HHA, Nursing Homes / Clarify orders / etc '[]'$  - 0 Routine Transfer to another Facility (non-emergent condition) '[]'$  - 0 Routine Hospital Admission (non-emergent condition) '[]'$  - 0 New Admissions / Biomedical engineer / Ordering NPWT, Apligraf, etc. '[]'$  - 0 Emergency Hospital Admission (emergent condition) PROCESS - Special Needs '[]'$  - Pediatric / Minor Patient Management 0 '[]'$  - 0 Isolation Patient Management '[]'$  - 0 Hearing / Language / Visual special needs '[]'$  - 0 Assessment of Community assistance (transportation, D/C planning, etc.) '[]'$  - 0 Additional assistance / Altered mentation '[]'$  - 0 Support Surface(s) Assessment (bed, cushion, seat, etc.) INTERVENTIONS - Miscellaneous '[]'$  - External ear exam 0 '[]'$  - 0 Patient Transfer (multiple staff / Civil Service fast streamer / Similar devices) '[]'$  - 0 Simple Staple / Suture removal (25 or less) '[]'$  - 0 Complex Staple / Suture removal (26 or more) '[]'$  - 0 Hypo/Hyperglycemic Management (do not check if billed separately) '[]'$  - 0 Ankle / Brachial Index (ABI) - do not check if billed separately Has the patient been seen at the hospital within the last three years: Yes Total Score: 0 Level Of Care: ____ [name] ([id number]) Electronic Signature(s) Signed: 09/23/2020  7:54:04 AM By: Carlene Coria RN Entered By: Carlene Coria on 09/22/2020 10:48:36 [name] ([id number]) -------------------------------------------------------------------------------- Compression Therapy Details Patient Name: [name],  Meghan M. Date of Service: 09/22/2020 10:30 AM Medical Record Number: [id number] Patient Account Number: [id number] Date of Birth/Sex: 1926-08-12 (87 y.o. F) Treating RN: Carlene Coria Primary Care Devante Capano: Sallee Lange Other Clinician: Jeanine Luz Referring Michel Hendon: Sallee Lange Treating Javid Kemler/Extender: Skipper Cliche in Treatment: 1 Compression Therapy Performed for Wound Assessment: Wound #1 Left,Medial,Anterior Lower Leg Performed By: Clinician Carlene Coria, RN Compression Type: Three Layer Post Procedure Diagnosis Same as Pre-procedure Electronic Signature(s) Signed: 09/23/2020 7:54:04 AM By: Carlene Coria RN Entered By: Carlene Coria on 09/22/2020 10:47:49 [name] ([id number]) -------------------------------------------------------------------------------- Compression Therapy Details Patient Name: [name]. Date of Service: 09/22/2020 10:30 AM Medical Record Number: [id number] Patient Account Number: [id number] Date of Birth/Sex: [birth year] (87 y.o. F) Treating RN: Carlene Coria Primary Care Rulon Abdalla: Sallee Lange Other Clinician: Jeanine Luz Referring Mallika Sanmiguel: Sallee Lange Treating Kerstin Crusoe/Extender: Skipper Cliche in Treatment: 1 Compression Therapy Performed for Wound Assessment: Wound #2 Right,Medial,Anterior Lower Leg Performed By: Clinician Carlene Coria, RN Compression Type: Three Layer Post Procedure Diagnosis Same as Pre-procedure Electronic Signature(s) Signed: 09/23/2020 7:54:04 AM By: Carlene Coria RN Entered By: Carlene Coria on 09/22/2020 10:47:50 [name] ([id number]) -------------------------------------------------------------------------------- Encounter Discharge  Information Details Patient Name: [name]. Date of Service: 09/22/2020 10:30 AM Medical Record Number: [id number] Patient Account Number: [id number] Date of Birth/Sex: February 24, 1927 (87 y.o. F) Treating RN: Donnamarie Poag Primary Care Tijuana Scheidegger: Sallee Lange Other Clinician: Jeanine Luz Referring Khalil Szczepanik: Sallee Lange Treating Shekinah Pitones/Extender: Skipper Cliche in Treatment: 1 Encounter Discharge Information Items Discharge Condition: Stable Ambulatory Status: Wheelchair Discharge Destination: Home Transportation: Private Auto Accompanied By: children Schedule Follow-up Appointment: Yes Clinical Summary of Care: Electronic Signature(s) Signed: 09/22/2020 3:11:20 PM By: Donnamarie Poag Entered By: Donnamarie Poag on 09/22/2020 11:13:32 [name] ([id number]) -------------------------------------------------------------------------------- Lower Extremity Assessment Details Patient Name: [name]. Date of Service: 09/22/2020 10:30 AM Medical Record Number: [id number] Patient Account Number: [id number] Date of Birth/Sex: Jan 11, 1927 (87 y.o. F) Treating RN: Dolan Amen Primary Care Lesta Limbert: Sallee Lange Other Clinician: Jeanine Luz Referring Melinda Gwinner: Sallee Lange Treating Candido Flott/Extender: Jeri Cos Weeks in Treatment: 1 Edema Assessment Assessed: [Left: Yes] [Right: Yes] Edema: [Left: Yes] [Right: Yes] Calf Left: Right: Point of Measurement: 31 cm From Medial Instep 32.5 cm 35.5 cm Ankle Left: Right: Point of Measurement: 9 cm From Medial Instep 21.5 cm 19.5 cm Vascular Assessment Pulses: Dorsalis Pedis Palpable: [Left:Yes] [Right:Yes] Electronic Signature(s) Signed: 09/22/2020 12:00:51 PM By: Georges Mouse, Minus Breeding RN Entered By: Georges Mouse, Kenia on 09/22/2020 10:37:12 [name] ([id number]) -------------------------------------------------------------------------------- Multi Wound Chart Details Patient Name: [name]. Date of Service: 09/22/2020 10:30 AM Medical Record Number: [id number] Patient Account Number: [id number] Date of Birth/Sex: Feb 01, 1927 (87 y.o. F) Treating RN: Carlene Coria Primary Care Averey Trompeter: Sallee Lange Other Clinician: Jeanine Luz Referring Shaquna Geigle: Sallee Lange Treating Cesily Cuoco/Extender: Skipper Cliche in Treatment: 1 Vital Signs Height(in): 71 Pulse(bpm): 77 Weight(lbs): 166 Blood Pressure(mmHg): 163/65 Body Mass Index(BMI): 30 Temperature(F): 98.3 Respiratory Rate(breaths/min): 18 Photos: [N/A:N/A] Wound Location: Left, Medial, Anterior Lower Leg Right, Medial, Anterior Lower Leg N/A Wounding Event: Blister Blister N/A Primary Etiology: Venous Leg Ulcer Venous Leg Ulcer N/A Secondary Etiology: Lymphedema Lymphedema N/A Comorbid History: Cataracts, Anemia, Lymphedema, Cataracts, Anemia, Lymphedema, N/A Arrhythmia, Congestive Heart Arrhythmia, Congestive Heart Failure, Coronary Artery Disease, Failure, Coronary Artery Disease, Hypertension, Type II Diabetes, Hypertension, Type II Diabetes, Gout, Osteoarthritis Gout, Osteoarthritis Date Acquired: 07/08/2020 07/08/2020 N/A Weeks of Treatment: 1 1 N/A Wound Status: Open Open N/A Measurements L x W x D (  cm) 0.1x0.1x0.1 0.7x0.8x0.1 N/A Area (cm) : 0.008 0.44 N/A Volume (cm) : 0.001 0.044 N/A % Reduction in Area: 99.70% 99.10% N/A % Reduction in Volume: 99.60% 99.10% N/A Classification: Full Thickness Without Exposed Full Thickness Without Exposed N/A Support Structures Support Structures Exudate Amount: None Present Medium N/A Exudate Type: N/A Serosanguineous N/A Exudate Color: N/A red, brown N/A Granulation Amount: None Present (0%) Medium (34-66%) N/A Granulation Quality: N/A Red N/A Necrotic Amount: None Present (0%) Medium (34-66%) N/A Exposed Structures: Fascia: No Fat Layer (Subcutaneous Tissue): N/A Fat Layer (Subcutaneous Tissue): Yes No Fascia: No Tendon: No Tendon: No Muscle:  No Muscle: No Joint: No Joint: No Bone: No Bone: No Epithelialization: Large (67-100%) Large (67-100%) N/A Treatment Notes Electronic Signature(s) Signed: 09/23/2020 7:54:04 AM By: Carlene Coria RN Entered By: Carlene Coria on 09/22/2020 10:46:22 [name] ([id number] White, Denese Killings ([id number]) -------------------------------------------------------------------------------- [place] Details Patient Name: [name], [name]. Date of Service: 09/22/2020 10:30 AM Medical Record Number: [id number] Patient Account Number: [id number] Date of Birth/Sex: 1927-04-03 (87 y.o. F) Treating RN: Carlene Coria Primary Care Mikayla Chiusano: Sallee Lange Other Clinician: Jeanine Luz Referring Corrinna Karapetyan: Sallee Lange Treating Yuma Blucher/Extender: Skipper Cliche in Treatment: 1 Active Inactive Abuse / Safety / Falls / Self Care Management Nursing Diagnoses: Potential for injury related to falls Goals: Patient will remain injury free related to falls Date Initiated: 09/15/2020 Target Resolution Date: 10/15/2020 Goal Status: Active Interventions: Assess Activities of Daily Living upon admission and as needed Assess fall risk on admission and as needed Assess: immobility, friction, shearing, incontinence upon admission and as needed Assess impairment of mobility on admission and as needed per policy Assess personal safety and home safety (as indicated) on admission and as needed Assess self care needs on admission and as needed Notes: Nutrition Nursing Diagnoses: Impaired glucose control: actual or potential Goals: Patient/caregiver verbalizes understanding of need to maintain therapeutic glucose control per primary care physician Date Initiated: 09/15/2020 Target Resolution Date: 10/15/2020 Goal Status: Active Interventions: Assess HgA1c results as ordered upon admission and as needed Assess patient nutrition upon admission and as needed per  policy Notes: Wound/Skin Impairment Nursing Diagnoses: Knowledge deficit related to ulceration/compromised skin integrity Goals: Patient/caregiver will verbalize understanding of skin care regimen Date Initiated: 09/15/2020 Target Resolution Date: 10/15/2020 Goal Status: Active Ulcer/skin breakdown will have a volume reduction of 30% by week 4 Date Initiated: 09/15/2020 Target Resolution Date: 11/15/2020 Goal Status: Active Ulcer/skin breakdown will have a volume reduction of 50% by week 8 Date Initiated: 09/15/2020 Target Resolution Date: 12/15/2020 Goal Status: Active Ulcer/skin breakdown will have a volume reduction of 80% by week 12 Date Initiated: 09/15/2020 Target Resolution Date: 01/15/2021 [name], [name] ([id number]) Goal Status: Active Ulcer/skin breakdown will heal within 14 weeks Date Initiated: 09/15/2020 Target Resolution Date: 02/15/2021 Goal Status: Active Interventions: Assess patient/caregiver ability to obtain necessary supplies Assess patient/caregiver ability to perform ulcer/skin care regimen upon admission and as needed Assess ulceration(s) every visit Notes: Electronic Signature(s) Signed: 09/23/2020 7:54:04 AM By: Carlene Coria RN Entered By: Carlene Coria on 09/22/2020 10:46:04 [name] ([id number]) -------------------------------------------------------------------------------- Pain Assessment Details Patient Name: [name]. Date of Service: 09/22/2020 10:30 AM Medical Record Number: [id number] Patient Account Number: [id number] Date of Birth/Sex: 02-05-1927 (87 y.o. F) Treating RN: Dolan Amen Primary Care Jamicia Haaland: Sallee Lange Other Clinician: Jeanine Luz Referring Loukisha Gunnerson: Sallee Lange Treating Corwin Kuiken/Extender: Skipper Cliche in Treatment: 1 Active Problems Location of Pain Severity and Description of Pain Patient Has Paino Yes Site Locations Pain Location:  Pain in Ulcers Rate the pain. Current Pain Level:  7 Pain Management and Medication Current Pain Management: Electronic Signature(s) Signed: 09/22/2020 12:00:51 PM By: Georges Mouse, Minus Breeding RN Entered By: Georges Mouse, Kenia on 09/22/2020 10:20:57 [name] ([id number]) -------------------------------------------------------------------------------- Patient/Caregiver Education Details Patient Name: [name]. Date of Service: 09/22/2020 10:30 AM Medical Record Number: [id number] Patient Account Number: [id number] Date of Birth/Gender: 1927-04-11 (87 y.o. F) Treating RN: Carlene Coria Primary Care Physician: Sallee Lange Other Clinician: Jeanine Luz Referring Physician: Sallee Lange Treating Physician/Extender: Skipper Cliche in Treatment: 1 Education Assessment Education Provided To: Patient Education Topics Provided Wound/Skin Impairment: Methods: Explain/Verbal Responses: State content correctly Electronic Signature(s) Signed: 09/23/2020 7:54:04 AM By: Carlene Coria RN Entered By: Carlene Coria on 09/22/2020 10:48:56 [name] ([id number]) -------------------------------------------------------------------------------- Wound Assessment Details Patient Name: [name]. Date of Service: 09/22/2020 10:30 AM Medical Record Number: [id number] Patient Account Number: [id number] Date of Birth/Sex: 23-Jun-1926 (87 y.o. F) Treating RN: Dolan Amen Primary Care Abbiegail Landgren: Sallee Lange Other Clinician: Jeanine Luz Referring Metzli Pollick: Sallee Lange Treating Montzerrat Brunell/Extender: Jeri Cos Weeks in Treatment: 1 Wound Status Wound Number: 1 Primary Venous Leg Ulcer Etiology: Wound Location: Left, Medial, Anterior Lower Leg Secondary Lymphedema Wounding Event: Blister Etiology: Date Acquired: 07/08/2020 Wound Open Weeks Of Treatment: 1 Status: Clustered Wound: No Comorbid Cataracts, Anemia, Lymphedema, Arrhythmia, Congestive History: Heart Failure, Coronary Artery Disease,  Hypertension, Type II Diabetes, Gout, Osteoarthritis Photos Wound Measurements Length: (cm) 0.1 Width: (cm) 0.1 Depth: (cm) 0.1 Area: (cm) 0.008 Volume: (cm) 0.001 % Reduction in Area: 99.7% % Reduction in Volume: 99.6% Epithelialization: Large (67-100%) Tunneling: No Undermining: No Wound Description Classification: Full Thickness Without Exposed Support Structures Exudate Amount: None Present Foul Odor After Cleansing: No Slough/Fibrino No Wound Bed Granulation Amount: None Present (0%) Exposed Structure Necrotic Amount: None Present (0%) Fascia Exposed: No Fat Layer (Subcutaneous Tissue) Exposed: No Tendon Exposed: No Muscle Exposed: No Joint Exposed: No Bone Exposed: No Treatment Notes Wound #1 (Lower Leg) Wound Laterality: Left, Medial, Anterior Cleanser Normal Saline Discharge Instruction: Wash your hands with soap and water. Remove old dressing, discard into plastic bag and place into trash. Cleanse the wound with Normal Saline prior to applying a clean dressing using gauze sponges, not tissues or cotton balls. Do not scrub or use excessive force. Pat dry using gauze sponges, not tissue or cotton balls. [name], [name] ([id number]) Peri-Wound Care Topical Primary Dressing Algicell Calcium Alginate Dressing 2x2 (in/in) Discharge Instruction: apply to wound bed Secondary Dressing ABD Pad 5x9 (in/in) Discharge Instruction: Cover with ABD pad Secured With Compression Wrap Profore Lite LF 3 Multilayer Compression Bandaging System Discharge Instruction: Apply 3 multi-layer wrap as prescribed. Compression Stockings Jobst Farrow Wrap 4000 Quantity: 1 Left Leg Compression Amount: 30-40 mmHg Discharge Instruction: Apply to lower extremity as directed. Add-Ons Electronic Signature(s) Signed: 09/22/2020 12:00:51 PM By: Georges Mouse, Minus Breeding RN Entered By: Georges Mouse, Kenia on 09/22/2020 10:33:52 [name]  ([id number]) -------------------------------------------------------------------------------- Wound Assessment Details Patient Name: [name]. Date of Service: 09/22/2020 10:30 AM Medical Record Number: [id number] Patient Account Number: [id number] Date of Birth/Sex: [birth year] (87 y.o. F) Treating RN: Dolan Amen Primary Care Tais Koestner: Sallee Lange Other Clinician: Jeanine Luz Referring Odelia Graciano: Sallee Lange Treating Kriston Mckinnie/Extender: Jeri Cos Weeks in Treatment: 1 Wound Status Wound Number: 2 Primary Venous Leg Ulcer Etiology: Wound Location: Right, Medial, Anterior Lower Leg Secondary Lymphedema Wounding Event: Blister Etiology: Date Acquired: 07/08/2020 Wound Open Weeks Of Treatment: 1 Status: Clustered Wound: No Comorbid Cataracts, Anemia, Lymphedema,  Arrhythmia, Congestive History: Heart Failure, Coronary Artery Disease, Hypertension, Type II Diabetes, Gout, Osteoarthritis Photos Wound Measurements Length: (cm) 0.7 Width: (cm) 0.8 Depth: (cm) 0.1 Area: (cm) 0.44 Volume: (cm) 0.044 % Reduction in Area: 99.1% % Reduction in Volume: 99.1% Epithelialization: Large (67-100%) Tunneling: No Undermining: No Wound Description Classification: Full Thickness Without Exposed Support Structures Exudate Amount: Medium Exudate Type: Serosanguineous Exudate Color: red, brown Foul Odor After Cleansing: No Slough/Fibrino Yes Wound Bed Granulation Amount: Medium (34-66%) Exposed Structure Granulation Quality: Red Fascia Exposed: No Necrotic Amount: Medium (34-66%) Fat Layer (Subcutaneous Tissue) Exposed: Yes Necrotic Quality: Adherent Slough Tendon Exposed: No Muscle Exposed: No Joint Exposed: No Bone Exposed: No Treatment Notes Wound #2 (Lower Leg) Wound Laterality: Right, Medial, Anterior Cleanser Normal Saline Discharge Instruction: Wash your hands with soap and water. Remove old dressing, discard into plastic bag and place into trash. Cleanse  the wound with Normal Saline prior to applying a clean dressing using gauze sponges, not tissues or cotton balls. Do not [name], [name]. ([id number]) scrub or use excessive force. Pat dry using gauze sponges, not tissue or cotton balls. Peri-Wound Care Topical Primary Dressing Algicell Calcium Alginate Dressing, 4X5 (in/in) Secondary Dressing ABD Pad 5x9 (in/in) Discharge Instruction: Cover with ABD pad Secured With Compression Wrap Compression Stockings Jobst Farrow Wrap 4000 Quantity: 1 Right Leg Compression Amount: 30-40 mmHg Discharge Instruction: Apply to lower extremity as directed. Add-Ons Electronic Signature(s) Signed: 09/22/2020 12:00:51 PM By: Georges Mouse, Minus Breeding RN Entered By: Georges Mouse, Kenia on 09/22/2020 10:34:26 [name] ([id number]) -------------------------------------------------------------------------------- Vitals Details Patient Name: [name]. Date of Service: 09/22/2020 10:30 AM Medical Record Number: [id number] Patient Account Number: [id number] Date of Birth/Sex: 1926-12-21 (87 y.o. F) Treating RN: Dolan Amen Primary Care Ollin Hochmuth: Sallee Lange Other Clinician: Jeanine Luz Referring Natalija Mavis: Sallee Lange Treating Holley Kocurek/Extender: Skipper Cliche in Treatment: 1 Vital Signs Time Taken: 10:20 Temperature (F): 98.3 Height (in): 62 Pulse (bpm): 79 Weight (lbs): 166 Respiratory Rate (breaths/min): 18 Body Mass Index (BMI): 30.4 Blood Pressure (mmHg): 163/65 Reference Range: 80 - 120 mg / dl Electronic Signature(s) Signed: 09/22/2020 12:00:51 PM By: Georges Mouse, Minus Breeding RN Entered By: Georges Mouse, Minus Breeding on 09/22/2020 10:20:47

## 2020-09-23 ENCOUNTER — Ambulatory Visit (INDEPENDENT_AMBULATORY_CARE_PROVIDER_SITE_OTHER): Payer: Medicare HMO | Admitting: *Deleted

## 2020-09-23 DIAGNOSIS — I639 Cerebral infarction, unspecified: Secondary | ICD-10-CM

## 2020-09-23 DIAGNOSIS — I4891 Unspecified atrial fibrillation: Secondary | ICD-10-CM | POA: Diagnosis not present

## 2020-09-23 DIAGNOSIS — Z5181 Encounter for therapeutic drug level monitoring: Secondary | ICD-10-CM

## 2020-09-23 LAB — POCT INR: INR: 4 — AB (ref 2.0–3.0)

## 2020-09-23 NOTE — Patient Instructions (Signed)
Hold warfarin tonight then decrease dose to 1/2 tablet daily  Recheck in 3 weeks

## 2020-09-25 DIAGNOSIS — E1151 Type 2 diabetes mellitus with diabetic peripheral angiopathy without gangrene: Secondary | ICD-10-CM | POA: Diagnosis not present

## 2020-09-25 DIAGNOSIS — I872 Venous insufficiency (chronic) (peripheral): Secondary | ICD-10-CM | POA: Diagnosis not present

## 2020-09-25 DIAGNOSIS — E11622 Type 2 diabetes mellitus with other skin ulcer: Secondary | ICD-10-CM | POA: Diagnosis not present

## 2020-09-25 DIAGNOSIS — L97822 Non-pressure chronic ulcer of other part of left lower leg with fat layer exposed: Secondary | ICD-10-CM | POA: Diagnosis not present

## 2020-09-25 DIAGNOSIS — I509 Heart failure, unspecified: Secondary | ICD-10-CM | POA: Diagnosis not present

## 2020-09-25 DIAGNOSIS — I890 Lymphedema, not elsewhere classified: Secondary | ICD-10-CM | POA: Diagnosis not present

## 2020-09-25 DIAGNOSIS — I130 Hypertensive heart and chronic kidney disease with heart failure and stage 1 through stage 4 chronic kidney disease, or unspecified chronic kidney disease: Secondary | ICD-10-CM | POA: Diagnosis not present

## 2020-09-25 DIAGNOSIS — Z4800 Encounter for change or removal of nonsurgical wound dressing: Secondary | ICD-10-CM | POA: Diagnosis not present

## 2020-09-25 DIAGNOSIS — L97812 Non-pressure chronic ulcer of other part of right lower leg with fat layer exposed: Secondary | ICD-10-CM | POA: Diagnosis not present

## 2020-09-26 ENCOUNTER — Other Ambulatory Visit: Payer: Self-pay

## 2020-09-26 DIAGNOSIS — I480 Paroxysmal atrial fibrillation: Secondary | ICD-10-CM | POA: Diagnosis not present

## 2020-09-26 DIAGNOSIS — L97821 Non-pressure chronic ulcer of other part of left lower leg limited to breakdown of skin: Secondary | ICD-10-CM | POA: Diagnosis not present

## 2020-09-26 DIAGNOSIS — Z950 Presence of cardiac pacemaker: Secondary | ICD-10-CM | POA: Diagnosis not present

## 2020-09-26 DIAGNOSIS — I872 Venous insufficiency (chronic) (peripheral): Secondary | ICD-10-CM | POA: Diagnosis not present

## 2020-09-26 DIAGNOSIS — I890 Lymphedema, not elsewhere classified: Secondary | ICD-10-CM | POA: Diagnosis not present

## 2020-09-26 DIAGNOSIS — E1122 Type 2 diabetes mellitus with diabetic chronic kidney disease: Secondary | ICD-10-CM | POA: Diagnosis not present

## 2020-09-26 DIAGNOSIS — E11622 Type 2 diabetes mellitus with other skin ulcer: Secondary | ICD-10-CM | POA: Diagnosis not present

## 2020-09-26 DIAGNOSIS — N184 Chronic kidney disease, stage 4 (severe): Secondary | ICD-10-CM | POA: Diagnosis not present

## 2020-09-26 DIAGNOSIS — L97811 Non-pressure chronic ulcer of other part of right lower leg limited to breakdown of skin: Secondary | ICD-10-CM | POA: Diagnosis not present

## 2020-09-26 NOTE — Progress Notes (Signed)
KENIDY, SANDAL ([id number]) Visit Report for 09/26/2020 Arrival Information Details Patient Name: [name], [name]. Date of Service: 09/26/2020 11:30 AM Medical Record Number: [id number] Patient Account Number: [id number] Date of Birth/Sex: 02-Jan-1927 (87 y.o. F) Treating RN: Dolan Amen Primary Care Nazyia Gaugh: Sallee Lange Other Clinician: Referring Roshan Roback: Sallee Lange Treating Kamayah Pillay/Extender: Skipper Cliche in Treatment: 1 Visit Information History Since Last Visit Pain Present Now: Yes Patient Arrived: Wheel Chair Arrival Time: 11:52 Accompanied By: son Transfer Assistance: None Patient Identification Verified: Yes Secondary Verification Process Yes Completed: Patient Has Alerts: Yes Patient Alerts: Patient on Blood Thinner on COUMADIN DIABETIC NON COMPRESSIBLE BIL LEGS Electronic Signature(s) Signed: 09/26/2020 5:00:25 PM By: Georges Mouse, Minus Breeding RN Entered By: Georges Mouse, Kenia on 09/26/2020 11:53:11 [name] ([id number]) -------------------------------------------------------------------------------- Clinic Level of Care Assessment Details Patient Name: [name]. Date of Service: 09/26/2020 11:30 AM Medical Record Number: [id number] Patient Account Number: [id number] Date of Birth/Sex: 1927/03/29 (87 y.o. F) Treating RN: Dolan Amen Primary Care Roper Tolson: Sallee Lange Other Clinician: Referring Maanasa Aderhold: Sallee Lange Treating Jacklyne Baik/Extender: Skipper Cliche in Treatment: 1 Clinic Level of Care Assessment Items TOOL 1 Quantity Score '[]'$  - Use when EandM and Procedure is performed on INITIAL visit 0 ASSESSMENTS - Nursing Assessment / Reassessment '[]'$  - General Physical Exam (combine w/ comprehensive assessment (listed just below) when performed on new 0 pt. evals) '[]'$  - 0 Comprehensive Assessment (HX, ROS, Risk Assessments, Wounds Hx, etc.) ASSESSMENTS - Wound and Skin Assessment / Reassessment '[]'$  - Dermatologic /  Skin Assessment (not related to wound area) 0 ASSESSMENTS - Ostomy and/or Continence Assessment and Care '[]'$  - Incontinence Assessment and Management 0 '[]'$  - 0 Ostomy Care Assessment and Management (repouching, etc.) PROCESS - Coordination of Care '[]'$  - Simple Patient / Family Education for ongoing care 0 '[]'$  - 0 Complex (extensive) Patient / Family Education for ongoing care '[]'$  - 0 Staff obtains Programmer, systems, Records, Test Results / Process Orders '[]'$  - 0 Staff telephones HHA, Nursing Homes / Clarify orders / etc '[]'$  - 0 Routine Transfer to another Facility (non-emergent condition) '[]'$  - 0 Routine Hospital Admission (non-emergent condition) '[]'$  - 0 New Admissions / Biomedical engineer / Ordering NPWT, Apligraf, etc. '[]'$  - 0 Emergency Hospital Admission (emergent condition) PROCESS - Special Needs '[]'$  - Pediatric / Minor Patient Management 0 '[]'$  - 0 Isolation Patient Management '[]'$  - 0 Hearing / Language / Visual special needs '[]'$  - 0 Assessment of Community assistance (transportation, D/C planning, etc.) '[]'$  - 0 Additional assistance / Altered mentation '[]'$  - 0 Support Surface(s) Assessment (bed, cushion, seat, etc.) INTERVENTIONS - Miscellaneous '[]'$  - External ear exam 0 '[]'$  - 0 Patient Transfer (multiple staff / Civil Service fast streamer / Similar devices) '[]'$  - 0 Simple Staple / Suture removal (25 or less) '[]'$  - 0 Complex Staple / Suture removal (26 or more) '[]'$  - 0 Hypo/Hyperglycemic Management (do not check if billed separately) '[]'$  - 0 Ankle / Brachial Index (ABI) - do not check if billed separately Has the patient been seen at the hospital within the last three years: Yes Total Score: 0 Level Of Care: ____ [name] ([id number]) Electronic Signature(s) Signed: 09/26/2020 5:00:25 PM By: Georges Mouse, Minus Breeding RN Entered By: Georges Mouse, Minus Breeding on 09/26/2020 12:06:45 [name]  ([id number]) -------------------------------------------------------------------------------- Compression Therapy Details Patient Name: [name]. Date of Service: 09/26/2020 11:30 AM Medical Record Number: [id number] Patient Account Number: [id number] Date of Birth/Sex: [birth year] (87 y.o. F) Treating RN: Dolan Amen Primary Care Erdine Hulen: Wolfgang Phoenix,  Scott Other Clinician: Referring Chauncy Mangiaracina: Sallee Lange Treating Arul Farabee/Extender: Skipper Cliche in Treatment: 1 Compression Therapy Performed for Wound Assessment: Wound #2 Right,Medial,Anterior Lower Leg Performed By: Cora Daniels, RN Compression Type: Three Layer Notes pt tolerating wrap well Electronic Signature(s) Signed: 09/26/2020 5:00:25 PM By: Georges Mouse, Minus Breeding RN Entered By: Georges Mouse, Kenia on 09/26/2020 12:06:10 [name] ([id number]) -------------------------------------------------------------------------------- Encounter Discharge Information Details Patient Name: [name]. Date of Service: 09/26/2020 11:30 AM Medical Record Number: [id number] Patient Account Number: [id number] Date of Birth/Sex: Apr 24, 1927 (87 y.o. F) Treating RN: Dolan Amen Primary Care Tipton Ballow: Sallee Lange Other Clinician: Referring Pratik Dalziel: Sallee Lange Treating Norvel Wenker/Extender: Skipper Cliche in Treatment: 1 Encounter Discharge Information Items Discharge Condition: Stable Ambulatory Status: Wheelchair Discharge Destination: Home Transportation: Private Auto Accompanied By: son Schedule Follow-up Appointment: Yes Clinical Summary of Care: Electronic Signature(s) Signed: 09/26/2020 5:00:25 PM By: Georges Mouse, Minus Breeding RN Entered By: Georges Mouse, Minus Breeding on 09/26/2020 12:06:40 [name] ([id number]) -------------------------------------------------------------------------------- Wound Assessment Details Patient Name: [name]. Date of Service: 09/26/2020  11:30 AM Medical Record Number: [id number] Patient Account Number: [id number] Date of Birth/Sex: 18-Sep-1926 (87 y.o. F) Treating RN: Dolan Amen Primary Care Oval Moralez: Sallee Lange Other Clinician: Referring Hamdan Toscano: Sallee Lange Treating Lacrecia Delval/Extender: Jeri Cos Weeks in Treatment: 1 Wound Status Wound Number: 1 Primary Etiology: Venous Leg Ulcer Wound Location: Left, Medial, Anterior Lower Leg Secondary Etiology: Lymphedema Wounding Event: Blister Wound Status: Open Date Acquired: 07/08/2020 Weeks Of Treatment: 1 Clustered Wound: No Wound Measurements Length: (cm) 0.1 Width: (cm) 0.1 Depth: (cm) 0.1 Area: (cm) 0.008 Volume: (cm) 0.001 % Reduction in Area: 99.7% % Reduction in Volume: 99.6% Wound Description Classification: Full Thickness Without Exposed Support Structure s Treatment Notes Wound #1 (Lower Leg) Wound Laterality: Left, Medial, Anterior Cleanser Normal Saline Discharge Instruction: Wash your hands with soap and water. Remove old dressing, discard into plastic bag and place into trash. Cleanse the wound with Normal Saline prior to applying a clean dressing using gauze sponges, not tissues or cotton balls. Do not scrub or use excessive force. Pat dry using gauze sponges, not tissue or cotton balls. Peri-Wound Care Topical Primary Dressing Algicell Calcium Alginate Dressing 2x2 (in/in) Discharge Instruction: apply to wound bed Secondary Dressing ABD Pad 5x9 (in/in) Discharge Instruction: Cover with ABD pad Secured With Compression Wrap Profore Lite LF 3 Multilayer Compression Bandaging System Discharge Instruction: Apply 3 multi-layer wrap as prescribed. Compression Stockings Jobst Farrow Wrap 4000 Quantity: 1 Left Leg Compression Amount: 30-40 mmHg Discharge Instruction: Apply to lower extremity as directed. Add-Ons Electronic Signature(s) Signed: 09/26/2020 5:00:25 PM By: Georges Mouse, Minus Breeding RN Ang[place], Denese Killings ([id number]) Entered  By: Georges Mouse, Minus Breeding on 09/26/2020 12:05:50 GLORIAN, BOLLES ([id number]) -------------------------------------------------------------------------------- Wound Assessment Details Patient Name: AMARACHI, MCHATTON. Date of Service: 09/26/2020 11:30 AM Medical Record Number: [id number] Patient Account Number: [id number] Date of Birth/Sex: 16-Apr-1927 (87 y.o. F) Treating RN: Dolan Amen Primary Care Tabbetha Kutscher: Sallee Lange Other Clinician: Referring Perl Folmar: Sallee Lange Treating Jeriko Kowalke/Extender: Jeri Cos Weeks in Treatment: 1 Wound Status Wound Number: 2 Primary Etiology: Venous Leg Ulcer Wound Location: Right, Medial, Anterior Lower Leg Secondary Etiology: Lymphedema Wounding Event: Blister Wound Status: Open Date Acquired: 07/08/2020 Weeks Of Treatment: 1 Clustered Wound: No Wound Measurements Length: (cm) 0.7 Width: (cm) 0.8 Depth: (cm) 0.1 Area: (cm) 0.44 Volume: (cm) 0.044 % Reduction in Area: 99.1% % Reduction in Volume: 99.1% Wound Description Classification: Full Thickness Without Exposed Support Structure s Treatment Notes Wound #2 (Lower Leg) Wound Laterality:  Right, Medial, Anterior Cleanser Normal Saline Discharge Instruction: Wash your hands with soap and water. Remove old dressing, discard into plastic bag and place into trash. Cleanse the wound with Normal Saline prior to applying a clean dressing using gauze sponges, not tissues or cotton balls. Do not scrub or use excessive force. Pat dry using gauze sponges, not tissue or cotton balls. Peri-Wound Care Topical Primary Dressing Algicell Calcium Alginate Dressing, 4X5 (in/in) Secondary Dressing ABD Pad 5x9 (in/in) Discharge Instruction: Cover with ABD pad Secured With Compression Wrap Compression Stockings Add-Ons Electronic Signature(s) Signed: 09/26/2020 5:00:25 PM By: Georges Mouse, Minus Breeding RN Entered By: Georges Mouse, Minus Breeding on 09/26/2020 12:05:50

## 2020-09-29 ENCOUNTER — Other Ambulatory Visit: Payer: Self-pay | Admitting: Family Medicine

## 2020-09-29 DIAGNOSIS — I872 Venous insufficiency (chronic) (peripheral): Secondary | ICD-10-CM | POA: Diagnosis not present

## 2020-09-29 DIAGNOSIS — I129 Hypertensive chronic kidney disease with stage 1 through stage 4 chronic kidney disease, or unspecified chronic kidney disease: Secondary | ICD-10-CM | POA: Diagnosis not present

## 2020-09-29 DIAGNOSIS — L97822 Non-pressure chronic ulcer of other part of left lower leg with fat layer exposed: Secondary | ICD-10-CM | POA: Diagnosis not present

## 2020-09-29 DIAGNOSIS — E1151 Type 2 diabetes mellitus with diabetic peripheral angiopathy without gangrene: Secondary | ICD-10-CM | POA: Diagnosis not present

## 2020-09-29 DIAGNOSIS — N170 Acute kidney failure with tubular necrosis: Secondary | ICD-10-CM | POA: Diagnosis not present

## 2020-09-29 DIAGNOSIS — L97812 Non-pressure chronic ulcer of other part of right lower leg with fat layer exposed: Secondary | ICD-10-CM | POA: Diagnosis not present

## 2020-09-29 DIAGNOSIS — I890 Lymphedema, not elsewhere classified: Secondary | ICD-10-CM | POA: Diagnosis not present

## 2020-09-29 DIAGNOSIS — N184 Chronic kidney disease, stage 4 (severe): Secondary | ICD-10-CM | POA: Diagnosis not present

## 2020-09-29 DIAGNOSIS — E11622 Type 2 diabetes mellitus with other skin ulcer: Secondary | ICD-10-CM | POA: Diagnosis not present

## 2020-09-29 DIAGNOSIS — D638 Anemia in other chronic diseases classified elsewhere: Secondary | ICD-10-CM | POA: Diagnosis not present

## 2020-09-29 DIAGNOSIS — I130 Hypertensive heart and chronic kidney disease with heart failure and stage 1 through stage 4 chronic kidney disease, or unspecified chronic kidney disease: Secondary | ICD-10-CM | POA: Diagnosis not present

## 2020-09-29 DIAGNOSIS — I509 Heart failure, unspecified: Secondary | ICD-10-CM | POA: Diagnosis not present

## 2020-09-29 DIAGNOSIS — Z4800 Encounter for change or removal of nonsurgical wound dressing: Secondary | ICD-10-CM | POA: Diagnosis not present

## 2020-10-01 DIAGNOSIS — D638 Anemia in other chronic diseases classified elsewhere: Secondary | ICD-10-CM | POA: Diagnosis not present

## 2020-10-01 DIAGNOSIS — Z4800 Encounter for change or removal of nonsurgical wound dressing: Secondary | ICD-10-CM | POA: Diagnosis not present

## 2020-10-01 DIAGNOSIS — R809 Proteinuria, unspecified: Secondary | ICD-10-CM | POA: Diagnosis not present

## 2020-10-01 DIAGNOSIS — E875 Hyperkalemia: Secondary | ICD-10-CM | POA: Diagnosis not present

## 2020-10-01 DIAGNOSIS — E1151 Type 2 diabetes mellitus with diabetic peripheral angiopathy without gangrene: Secondary | ICD-10-CM | POA: Diagnosis not present

## 2020-10-01 DIAGNOSIS — I129 Hypertensive chronic kidney disease with stage 1 through stage 4 chronic kidney disease, or unspecified chronic kidney disease: Secondary | ICD-10-CM | POA: Diagnosis not present

## 2020-10-01 DIAGNOSIS — I872 Venous insufficiency (chronic) (peripheral): Secondary | ICD-10-CM | POA: Diagnosis not present

## 2020-10-01 DIAGNOSIS — N170 Acute kidney failure with tubular necrosis: Secondary | ICD-10-CM | POA: Diagnosis not present

## 2020-10-01 DIAGNOSIS — N184 Chronic kidney disease, stage 4 (severe): Secondary | ICD-10-CM | POA: Diagnosis not present

## 2020-10-01 NOTE — Progress Notes (Signed)
Remote pacemaker transmission.   

## 2020-10-02 ENCOUNTER — Encounter: Payer: Medicare HMO | Admitting: Internal Medicine

## 2020-10-02 ENCOUNTER — Other Ambulatory Visit: Payer: Self-pay

## 2020-10-02 DIAGNOSIS — N184 Chronic kidney disease, stage 4 (severe): Secondary | ICD-10-CM | POA: Diagnosis not present

## 2020-10-02 DIAGNOSIS — E1122 Type 2 diabetes mellitus with diabetic chronic kidney disease: Secondary | ICD-10-CM | POA: Diagnosis not present

## 2020-10-02 DIAGNOSIS — L97812 Non-pressure chronic ulcer of other part of right lower leg with fat layer exposed: Secondary | ICD-10-CM | POA: Diagnosis not present

## 2020-10-02 DIAGNOSIS — I872 Venous insufficiency (chronic) (peripheral): Secondary | ICD-10-CM | POA: Diagnosis not present

## 2020-10-02 DIAGNOSIS — I890 Lymphedema, not elsewhere classified: Secondary | ICD-10-CM | POA: Diagnosis not present

## 2020-10-02 DIAGNOSIS — L97811 Non-pressure chronic ulcer of other part of right lower leg limited to breakdown of skin: Secondary | ICD-10-CM | POA: Diagnosis not present

## 2020-10-02 DIAGNOSIS — L97821 Non-pressure chronic ulcer of other part of left lower leg limited to breakdown of skin: Secondary | ICD-10-CM | POA: Diagnosis not present

## 2020-10-02 DIAGNOSIS — E11622 Type 2 diabetes mellitus with other skin ulcer: Secondary | ICD-10-CM | POA: Diagnosis not present

## 2020-10-02 DIAGNOSIS — Z950 Presence of cardiac pacemaker: Secondary | ICD-10-CM | POA: Diagnosis not present

## 2020-10-02 DIAGNOSIS — I480 Paroxysmal atrial fibrillation: Secondary | ICD-10-CM | POA: Diagnosis not present

## 2020-10-02 NOTE — Progress Notes (Addendum)
SWEET, WESTHAFER ([id number]) Visit Report for 10/02/2020 Debridement Details Patient Name: [name], [name]. Date of Service: 10/02/2020 12:30 PM Medical Record Number: [id number] Patient Account Number: [id number] Date of Birth/Sex: 30-Oct-1926 (87 y.o. F) Treating RN: Dolan Amen Primary Care Provider: Sallee Lange Other Clinician: Jeanine Luz Referring Provider: Sallee Lange Treating Provider/Extender: Tito Dine in Treatment: 2 Debridement Performed for Wound #2 Right,Medial,Anterior Lower Leg Assessment: Performed By: Physician Ricard Dillon, MD Debridement Type: Debridement Severity of Tissue Pre Debridement: Fat layer exposed Level of Consciousness (Pre- Awake and Alert procedure): Pre-procedure Verification/Time Out Yes - 13:14 Taken: Start Time: 13:14 Total Area Debrided (L x W): 3 (cm) x 1.5 (cm) = 4.5 (cm) Tissue and other material Viable, Non-Viable, Slough, Subcutaneous, Slough debrided: Level: Skin/Subcutaneous Tissue Debridement Description: Excisional Instrument: Curette Bleeding: Minimum Hemostasis Achieved: Pressure Response to Treatment: Procedure was tolerated well Level of Consciousness (Post- Awake and Alert procedure): Post Debridement Measurements of Total Wound Length: (cm) 4.5 Width: (cm) 3 Depth: (cm) 0.2 Volume: (cm) 2.121 Character of Wound/Ulcer Post Debridement: Stable Severity of Tissue Post Debridement: Fat layer exposed Post Procedure Diagnosis Same as Pre-procedure Electronic Signature(s) Signed: 10/02/2020 5:05:31 PM By: Charlett Nose RN Signed: 10/02/2020 5:07:17 PM By: Linton Ham MD Entered By: Linton Ham on 10/02/2020 13:19:23 [name] ([id number]) -------------------------------------------------------------------------------- HPI Details Patient Name: [name]. Date of Service: 10/02/2020 12:30 PM Medical Record Number: [id number] Patient Account Number:  [id number] Date of Birth/Sex: Jan 02, 1927 (87 y.o. F) Treating RN: Dolan Amen Primary Care Provider: Sallee Lange Other Clinician: Jeanine Luz Referring Provider: Sallee Lange Treating Provider/Extender: Tito Dine in Treatment: 2 History of Present Illness HPI Description: 09/15/20 upon evaluation today patient presents for initial inspection here in our clinic concerning issues she has been having with blisters on the lower extremities bilaterally. She tells me that this began around the beginning of February. With that being said she does have a longstanding history of lymphedema she tells me. She also has chronic kidney disease stage IV which is causing some swelling as well her nephrologist did place her recently on Lasix which has helped to some degree. Her wounds actually appear to be healing which is great news. The patient does have a history of chronic venous insufficiency, lymphedema, diabetes mellitus type 2 which is controlled with diet she is not on any medications her last hemoglobin A1c was 5.6. She also has atrial fibrillation, cardiac pacemaker, and again stage IV kidney disease. 09/22/2020 upon evaluation today patient appears to be doing well with regard to her wounds. She has been tolerating the dressing changes without complication. With that being said even in the past week she has made a dramatic improvement even though apparently the home health nurse actually placed her in an Unna boot wrap and not an actual 3 layer compression wrap. I suspect this was due to the fact that the patient did not have the medications needed to appropriately apply the dressings at home. This is fine but the nurse did not contact us to let me know. 4/28; the patient's left leg wounds on the medial aspect are healed on the right the wound that we had is measuring smaller but our nurse reports a new area in the original blistered area. We have been using alginate Electronic  Signature(s) Signed: 10/02/2020 5:07:17 PM By: Linton Ham MD Entered By: Linton Ham on 10/02/2020 13:20:01 [name] ([id number]) -------------------------------------------------------------------------------- Physical Exam Details Patient Name: [name]. Date of Service:  10/02/2020 12:30 PM Medical Record Number: [id number] Patient Account Number: [id number] Date of Birth/Sex: 04/03/1927 (87 y.o. F) Treating RN: Dolan Amen Primary Care Provider: Sallee Lange Other Clinician: Jeanine Luz Referring Provider: Sallee Lange Treating Provider/Extender: Tito Dine in Treatment: 2 Constitutional Patient is hypertensive.. Pulse regular and within target range for patient.Marland Kitchen Respirations regular, non-labored and within target range.. Temperature is normal and within the target range for the patient.Marland Kitchen appears in no distress. Notes Wound exam; the area on the left medial leg is healed. We can transition her today to her juxta lite stockings. o On the right she has a more substantial area on the lateral part of the original blistered area. Unfortunately covered with a very difficult gritty surface which I debrided with a #5 curette both areas clean up nicely but looked dry Electronic Signature(s) Signed: 10/02/2020 5:07:17 PM By: Linton Ham MD Entered By: Linton Ham on 10/02/2020 13:22:35 [name] ([id number]) -------------------------------------------------------------------------------- Physician Orders Details Patient Name: [name]. Date of Service: 10/02/2020 12:30 PM Medical Record Number: [id number] Patient Account Number: [id number] Date of Birth/Sex: May 19, 1927 (87 y.o. F) Treating RN: Dolan Amen Primary Care Provider: Sallee Lange Other Clinician: Jeanine Luz Referring Provider: Sallee Lange Treating Provider/Extender: Tito Dine in Treatment: 2 Verbal / Phone Orders: No Diagnosis  Coding Follow-up Appointments o Return Appointment in 1 week. [facility] for wound care. May utilize formulary equivalent dressing for wound treatment orders unless otherwise specified. Home Health Nurse may visit [facility] to address patientos wound care needs. - [facility] o Scheduled days for dressing changes to be completed; exception, patient has scheduled wound care visit that day. - 2 x a week- Monday/Thursday or Tuesday/Friday o **Please direct any NON-WOUND related issues/requests for orders to patient's Primary Care Physician. **If current dressing causes regression in wound condition, may D/C ordered dressing product/s and apply Normal Saline Moist Dressing daily until next [place] or Other MD appointment. **Notify [facility] of regression in wound condition at [phone]. Bathing/ Shower/ Hygiene o May shower with wound dressing protected with water repellent cover or cast protector. Edema Control - Lymphedema / Segmental Compressive Device / Other Right Lower Extremity o Optional: One layer of unna paste to top of compression wrap (to act as an anchor). o 3 Layer Compression System for Lymphedema. o Patient to wear own Velcro compression garment. Remove compression stockings every night before going to bed and put on every morning when getting up. - Left extremity o Elevate, Exercise Daily and Avoid Standing for Long Periods of Time. o Elevate legs to the level of the heart and pump ankles as often as possible o Elevate leg(s) parallel to the floor when sitting. Wound Treatment Wound #2 - Lower Leg Wound Laterality: Right, Medial, Anterior Cleanser: Normal Saline (Generic) 2 x Per Week/30 Days Discharge Instructions: Wash your hands with soap and water. Remove old dressing, discard into plastic bag and place into trash. Cleanse the wound with Normal Saline prior to applying a clean dressing using gauze sponges, not  tissues or cotton balls. Do not scrub or use excessive force. Pat dry using gauze sponges, not tissue or cotton balls. Primary Dressing: Hydrofera Blue Ready Transfer Foam, 2.5x2.5 (in/in) 2 x Per Week/30 Days Discharge Instructions: Apply Hydrofera Blue Ready to wound bed as directed Secondary Dressing: ABD Pad 5x9 (in/in) (Generic) 2 x Per Week/30 Days Discharge Instructions: Cover with ABD pad Compression Wrap: Profore Lite LF 3 Multilayer Compression Bandaging  System (Generic) 2 x Per Week/30 Days Discharge Instructions: Apply 3 multi-layer wrap as prescribed. Electronic Signature(s) Signed: 10/03/2020 4:40:00 PM By: Linton Ham MD Signed: 10/03/2020 4:45:18 PM By: Charlett Nose RN Previous Signature: 10/02/2020 5:05:31 PM Version By: Charlett Nose RN Previous Signature: 10/02/2020 5:07:17 PM Version By: Linton Ham MD Entered By: Georges Mouse, Minus Breeding on 10/03/2020 14:26:08 [name] ([id number]) -------------------------------------------------------------------------------- Problem List Details Patient Name: [name], [name]. Date of Service: 10/02/2020 12:30 PM Medical Record Number: [id number] Patient Account Number: [id number] Date of Birth/Sex: [birth year] (87 y.o. F) Treating RN: Dolan Amen Primary Care Provider: Sallee Lange Other Clinician: Jeanine Luz Referring Provider: Sallee Lange Treating Provider/Extender: Tito Dine in Treatment: 2 Active Problems ICD-10 Encounter Code Description Active Date MDM Diagnosis I89.0 Lymphedema, not elsewhere classified 09/15/2020 No Yes I87.2 Venous insufficiency (chronic) (peripheral) 09/15/2020 No Yes L97.821 Non-pressure chronic ulcer of other part of left lower leg limited to 09/15/2020 No Yes breakdown of skin L97.811 Non-pressure chronic ulcer of other part of right lower leg limited to 09/15/2020 No Yes breakdown of skin E11.622 Type 2 diabetes mellitus with other skin  ulcer 09/15/2020 No Yes I48.0 Paroxysmal atrial fibrillation 09/15/2020 No Yes Z95.0 Presence of cardiac pacemaker 09/15/2020 No Yes N18.4 Chronic kidney disease, stage 4 (severe) 09/15/2020 No Yes Inactive Problems Resolved Problems Electronic Signature(s) Signed: 10/02/2020 5:07:17 PM By: Linton Ham MD Entered By: Linton Ham on 10/02/2020 13:19:03 [name] ([id number]) -------------------------------------------------------------------------------- Progress Note Details Patient Name: [name]. Date of Service: 10/02/2020 12:30 PM Medical Record Number: [id number] Patient Account Number: [id number] Date of Birth/Sex: 10/19/1926 (87 y.o. F) Treating RN: Dolan Amen Primary Care Provider: Sallee Lange Other Clinician: Jeanine Luz Referring Provider: Sallee Lange Treating Provider/Extender: Tito Dine in Treatment: 2 Subjective History of Present Illness (HPI) 09/15/20 upon evaluation today patient presents for initial inspection here in our clinic concerning issues she has been having with blisters on the lower extremities bilaterally. She tells me that this began around the beginning of February. With that being said she does have a longstanding history of lymphedema she tells me. She also has chronic kidney disease stage IV which is causing some swelling as well her nephrologist did place her recently on Lasix which has helped to some degree. Her wounds actually appear to be healing which is great news. The patient does have a history of chronic venous insufficiency, lymphedema, diabetes mellitus type 2 which is controlled with diet she is not on any medications her last hemoglobin A1c was 5.6. She also has atrial fibrillation, cardiac pacemaker, and again stage IV kidney disease. 09/22/2020 upon evaluation today patient appears to be doing well with regard to her wounds. She has been tolerating the dressing changes without complication. With  that being said even in the past week she has made a dramatic improvement even though apparently the home health nurse actually placed her in an Unna boot wrap and not an actual 3 layer compression wrap. I suspect this was due to the fact that the patient did not have the medications needed to appropriately apply the dressings at home. This is fine but the nurse did not contact us to let me know. 4/28; the patient's left leg wounds on the medial aspect are healed on the right the wound that we had is measuring smaller but our nurse reports a new area in the original blistered area. We have been using alginate Objective Constitutional Patient is hypertensive.. Pulse regular and within target  range for patient.Marland Kitchen Respirations regular, non-labored and within target range.. Temperature is normal and within the target range for the patient.Marland Kitchen appears in no distress. Vitals Time Taken: 12:50 PM, Height: 62 in, Weight: 166 lbs, BMI: 30.4, Temperature: 98 F, Pulse: 67 bpm, Respiratory Rate: 16 breaths/min, Blood Pressure: 153/52 mmHg. General Notes: Wound exam; the area on the left medial leg is healed. We can transition her today to her juxta lite stockings. On the right she has a more substantial area on the lateral part of the original blistered area. Unfortunately covered with a very difficult gritty surface which I debrided with a #5 curette both areas clean up nicely but looked dry Integumentary (Hair, Skin) Wound #1 status is Healed - Epithelialized. Original cause of wound was Blister. The date acquired was: 07/08/2020. The wound has been in treatment 2 weeks. The wound is located on the Left,Medial,Anterior Lower Leg. The wound measures 0cm length x 0cm width x 0cm depth; 0cm^2 area and 0cm^3 volume. There is no tunneling or undermining noted. There is a none present amount of drainage noted. There is no granulation within the wound bed. There is no necrotic tissue within the wound bed. Wound #2  status is Open. Original cause of wound was Blister. The date acquired was: 07/08/2020. The wound has been in treatment 2 weeks. The wound is located on the Right,Medial,Anterior Lower Leg. The wound measures 4.5cm length x 3cm width x 0.1cm depth; 10.603cm^2 area and 1.06cm^3 volume. There is Fat Layer (Subcutaneous Tissue) exposed. There is no tunneling or undermining noted. There is a medium amount of serosanguineous drainage noted. There is small (1-33%) pink granulation within the wound bed. There is a large (67-100%) amount of necrotic tissue within the wound bed including Adherent Slough. Assessment Active Problems ICD-10 Lymphedema, not elsewhere classified Venous insufficiency (chronic) (peripheral) Non-pressure chronic ulcer of other part of left lower leg limited to breakdown of skin Non-pressure chronic ulcer of other part of right lower leg limited to breakdown of skin Type 2 diabetes mellitus with other skin ulcer [name], [name]. ([id number]) Paroxysmal atrial fibrillation Presence of cardiac pacemaker Chronic kidney disease, stage 4 (severe) Procedures Wound #2 Pre-procedure diagnosis of Wound #2 is a Venous Leg Ulcer located on the Right,Medial,Anterior Lower Leg .Severity of Tissue Pre Debridement is: Fat layer exposed. There was a Excisional Skin/Subcutaneous Tissue Debridement with a total area of 4.5 sq cm performed by Ricard Dillon, MD. With the following instrument(s): Curette to remove Viable and Non-Viable tissue/material. Material removed includes Subcutaneous Tissue and Slough and. A time out was conducted at 13:14, prior to the start of the procedure. A Minimum amount of bleeding was controlled with Pressure. The procedure was tolerated well. Post Debridement Measurements: 4.5cm length x 3cm width x 0.2cm depth; 2.121cm^3 volume. Character of Wound/Ulcer Post Debridement is stable. Severity of Tissue Post Debridement is: Fat layer exposed. Post procedure  Diagnosis Wound #2: Same as Pre-Procedure Plan Follow-up Appointments: Return Appointment in 1 week. Home Health: [facility] for wound care. May utilize formulary equivalent dressing for wound treatment orders unless otherwise specified. Home Health Nurse may visit [facility] to address patient s wound care needs. - [facility] Bathing/ Shower/ Hygiene: May shower with wound dressing protected with water repellent cover or cast protector. Edema Control - Lymphedema / Segmental Compressive Device / Other: Optional: One layer of unna paste to top of compression wrap (to act as an anchor). 3 Layer Compression System for Lymphedema. Elevate, Exercise Daily and Avoid Standing  for Long Periods of Time. Elevate legs to the level of the heart and pump ankles as often as possible Elevate leg(s) parallel to the floor when sitting. WOUND #2: - Lower Leg Wound Laterality: Right, Medial, Anterior Cleanser: Normal Saline (Generic) 2 x Per Week/30 Days Discharge Instructions: Wash your hands with soap and water. Remove old dressing, discard into plastic bag and place into trash. Cleanse the wound with Normal Saline prior to applying a clean dressing using gauze sponges, not tissues or cotton balls. Do not scrub or use excessive force. Pat dry using gauze sponges, not tissue or cotton balls. Primary Dressing: Hydrofera Blue Ready Transfer Foam, 2.5x2.5 (in/in) 2 x Per Week/30 Days Discharge Instructions: Apply Hydrofera Blue Ready to wound bed as directed Secondary Dressing: ABD Pad 5x9 (in/in) (Generic) 2 x Per Week/30 Days Discharge Instructions: Cover with ABD pad Compression Wrap: Profore Lite LF 3 Multilayer Compression Bandaging System (Generic) 2 x Per Week/30 Days Discharge Instructions: Apply 3 multi-layer wrap as prescribed. 1. I change the dressing on the right leg to [facility] under the same compression 2. Transitioning her to her external compression garment on the left everything is  healed here Electronic Signature(s) Signed: 10/02/2020 5:07:17 PM By: Linton Ham MD Entered By: Linton Ham on 10/02/2020 13:23:15 [name] ([id number]) -------------------------------------------------------------------------------- SuperBill Details Patient Name: [name]. Date of Service: 10/02/2020 Medical Record Number: [id number] Patient Account Number: [id number] Date of Birth/Sex: 1926/07/12 (87 y.o. F) Treating RN: Dolan Amen Primary Care Provider: Sallee Lange Other Clinician: Jeanine Luz Referring Provider: Sallee Lange Treating Provider/Extender: Tito Dine in Treatment: 2 Diagnosis Coding ICD-10 Codes Code Description I89.0 Lymphedema, not elsewhere classified I87.2 Venous insufficiency (chronic) (peripheral) L97.821 Non-pressure chronic ulcer of other part of left lower leg limited to breakdown of skin L97.811 Non-pressure chronic ulcer of other part of right lower leg limited to breakdown of skin E11.622 Type 2 diabetes mellitus with other skin ulcer I48.0 Paroxysmal atrial fibrillation Z95.0 Presence of cardiac pacemaker N18.4 Chronic kidney disease, stage 4 (severe) Facility Procedures CPT4 Code: [id number] Description: [id number] - DEB SUBQ TISSUE 20 SQ CM/< Modifier: Quantity: 1 CPT4 Code: Description: ICD-10 Diagnosis Description L97.811 Non-pressure chronic ulcer of other part of right lower leg limited to br Modifier: eakdown of skin Quantity: Physician Procedures CPT4 [id number] Description: 11042 - WC PHYS SUBQ TISS 20 SQ CM Modifier: Quantity: 1 CPT4 Code: Description: ICD-10 Diagnosis Description L97.811 Non-pressure chronic ulcer of other part of right lower leg limited to br Modifier: eakdown of skin Quantity: Electronic Signature(s) Signed: 10/02/2020 5:07:17 PM By: Linton Ham MD Entered By: Linton Ham on 10/02/2020 13:23:25

## 2020-10-03 NOTE — Progress Notes (Signed)
SETSUKO, KIPLINGER ([id number]) Visit Report for 10/02/2020 Arrival Information Details Patient Name: SANAII, NOLETTE. Date of Service: 10/02/2020 12:30 PM Medical Record Number: [id number] Patient Account Number: [id number] Date of Birth/Sex: 1926-11-10 (87 y.o. F) Treating RN: Carlene Coria Primary Care Jeslie Lowe: Sallee Lange Other Clinician: Jeanine Luz Referring Tavaris Eudy: Sallee Lange Treating Meryn Sarracino/Extender: Tito Dine in Treatment: 2 Visit Information History Since Last Visit All ordered tests and consults were completed: No Patient Arrived: Wheel Chair Added or deleted any medications: No Arrival Time: 12:50 Any new allergies or adverse reactions: No Accompanied By: daughter Had a fall or experienced change in No Transfer Assistance: None activities of daily living that may affect Patient Identification Verified: Yes risk of falls: Secondary Verification Process Yes Signs or symptoms of abuse/neglect since last visito No Completed: Hospitalized since last visit: No Patient Has Alerts: Yes Implantable device outside of the clinic excluding No Patient Alerts: Patient on Blood Thinner cellular tissue based products placed in the center on COUMADIN since last visit: DIABETIC Has Dressing in Place as Prescribed: Yes NON COMPRESSIBLE BIL LEGS Has Compression in Place as Prescribed: Yes Pain Present Now: No Electronic Signature(s) Signed: 10/03/2020 8:24:32 AM By: Carlene Coria RN Entered By: Carlene Coria on 10/02/2020 12:50:46 [name] ([id number]) -------------------------------------------------------------------------------- Clinic Level of Care Assessment Details Patient Name: [name]. Date of Service: 10/02/2020 12:30 PM Medical Record Number: [id number] Patient Account Number: [id number] Date of Birth/Sex: 1926/09/19 (87 y.o. F) Treating RN: Dolan Amen Primary Care Placido Hangartner: Sallee Lange Other Clinician: Jeanine Luz Referring Scheryl Sanborn: Sallee Lange Treating Plummer Matich/Extender: Tito Dine in Treatment: 2 Clinic Level of Care Assessment Items TOOL 1 Quantity Score '[]'$  - Use when EandM and Procedure is performed on INITIAL visit 0 ASSESSMENTS - Nursing Assessment / Reassessment '[]'$  - General Physical Exam (combine w/ comprehensive assessment (listed just below) when performed on new 0 pt. evals) '[]'$  - 0 Comprehensive Assessment (HX, ROS, Risk Assessments, Wounds Hx, etc.) ASSESSMENTS - Wound and Skin Assessment / Reassessment '[]'$  - Dermatologic / Skin Assessment (not related to wound area) 0 ASSESSMENTS - Ostomy and/or Continence Assessment and Care '[]'$  - Incontinence Assessment and Management 0 '[]'$  - 0 Ostomy Care Assessment and Management (repouching, etc.) PROCESS - Coordination of Care '[]'$  - Simple Patient / Family Education for ongoing care 0 '[]'$  - 0 Complex (extensive) Patient / Family Education for ongoing care '[]'$  - 0 Staff obtains Programmer, systems, Records, Test Results / Process Orders '[]'$  - 0 Staff telephones HHA, Nursing Homes / Clarify orders / etc '[]'$  - 0 Routine Transfer to another Facility (non-emergent condition) '[]'$  - 0 Routine Hospital Admission (non-emergent condition) '[]'$  - 0 New Admissions / Biomedical engineer / Ordering NPWT, Apligraf, etc. '[]'$  - 0 Emergency Hospital Admission (emergent condition) PROCESS - Special Needs '[]'$  - Pediatric / Minor Patient Management 0 '[]'$  - 0 Isolation Patient Management '[]'$  - 0 Hearing / Language / Visual special needs '[]'$  - 0 Assessment of Community assistance (transportation, D/C planning, etc.) '[]'$  - 0 Additional assistance / Altered mentation '[]'$  - 0 Support Surface(s) Assessment (bed, cushion, seat, etc.) INTERVENTIONS - Miscellaneous '[]'$  - External ear exam 0 '[]'$  - 0 Patient Transfer (multiple staff / Civil Service fast streamer / Similar devices) '[]'$  - 0 Simple Staple / Suture removal (25 or less) '[]'$  - 0 Complex Staple / Suture removal (26  or more) '[]'$  - 0 Hypo/Hyperglycemic Management (do not check if billed separately) '[]'$  - 0 Ankle / Brachial Index (ABI) - do not  check if billed separately Has the patient been seen at the hospital within the last three years: Yes Total Score: 0 Level Of Care: ____ [name] ([id number]) Electronic Signature(s) Signed: 10/02/2020 5:05:31 PM By: Georges Mouse, Minus Breeding RN Entered By: Georges Mouse, Kenia on 10/02/2020 13:19:54 [name] ([id number]) -------------------------------------------------------------------------------- Encounter Discharge Information Details Patient Name: OLUWATOBILOBA, DRAINE. Date of Service: 10/02/2020 12:30 PM Medical Record Number: [id number] Patient Account Number: [id number] Date of Birth/Sex: 1927/04/22 (87 y.o. F) Treating RN: Dolan Amen Primary Care Delonta Yohannes: Sallee Lange Other Clinician: Jeanine Luz Referring Khilee Hendricksen: Sallee Lange Treating Blandina Renaldo/Extender: Tito Dine in Treatment: 2 Encounter Discharge Information Items Post Procedure Vitals Discharge Condition: Stable Temperature (F): 98 Ambulatory Status: Wheelchair Pulse (bpm): 67 Discharge Destination: Home Respiratory Rate (breaths/min): 16 Transportation: Private Auto Blood Pressure (mmHg): 153/52 Accompanied By: daughter Schedule Follow-up Appointment: Yes Clinical Summary of Care: Electronic Signature(s) Signed: 10/02/2020 4:35:13 PM By: Jeanine Luz Entered By: Jeanine Luz on 10/02/2020 13:37:16 [name] ([id number]) -------------------------------------------------------------------------------- Lower Extremity Assessment Details Patient Name: [name]. Date of Service: 10/02/2020 12:30 PM Medical Record Number: [id number] Patient Account Number: [id number] Date of Birth/Sex: Sep 14, 1926 (87 y.o. F) Treating RN: Carlene Coria Primary Care Tyquez Hollibaugh: Sallee Lange Other Clinician: Jeanine Luz Referring Shivaay Stormont:  Sallee Lange Treating Daevon Holdren/Extender: Tito Dine in Treatment: 2 Edema Assessment Assessed: [Left: No] [Right: No] [Left: Edema] [Right: :] Calf Left: Right: Point of Measurement: 31 cm From Medial Instep 32 cm 35 cm Ankle Left: Right: Point of Measurement: 9 cm From Medial Instep 21 cm 19 cm Vascular Assessment Pulses: Dorsalis Pedis Palpable: [Left:Yes] [Right:Yes] Electronic Signature(s) Signed: 10/03/2020 8:24:32 AM By: Carlene Coria RN Entered By: Carlene Coria on 10/02/2020 13:10:03 [name] ([id number]) -------------------------------------------------------------------------------- Multi Wound Chart Details Patient Name: [name]. Date of Service: 10/02/2020 12:30 PM Medical Record Number: [id number] Patient Account Number: [id number] Date of Birth/Sex: [birth year] (87 y.o. F) Treating RN: Dolan Amen Primary Care Taniah Reinecke: Sallee Lange Other Clinician: Jeanine Luz Referring Halana Deisher: Sallee Lange Treating Valdez Brannan/Extender: Tito Dine in Treatment: 2 Vital Signs Height(in): 23 Pulse(bpm): 69 Weight(lbs): 166 Blood Pressure(mmHg): 153/52 Body Mass Index(BMI): 30 Temperature(F): 98 Respiratory Rate(breaths/min): 16 Photos: [N/A:N/A] Wound Location: Left, Medial, Anterior Lower Leg Right, Medial, Anterior Lower Leg N/A Wounding Event: Blister Blister N/A Primary Etiology: Venous Leg Ulcer Venous Leg Ulcer N/A Secondary Etiology: Lymphedema Lymphedema N/A Comorbid History: Cataracts, Anemia, Lymphedema, Cataracts, Anemia, Lymphedema, N/A Arrhythmia, Congestive Heart Arrhythmia, Congestive Heart Failure, Coronary Artery Disease, Failure, Coronary Artery Disease, Hypertension, Type II Diabetes, Hypertension, Type II Diabetes, Gout, Osteoarthritis Gout, Osteoarthritis Date Acquired: 07/08/2020 07/08/2020 N/A Weeks of Treatment: 2 2 N/A Wound Status: Healed - Epithelialized Open N/A Measurements L x W x D (cm)  0x0x0 4.5x3x0.1 N/A Area (cm) : 0 10.603 N/A Volume (cm) : 0 1.06 N/A % Reduction in Area: 100.00% 77.30% N/A % Reduction in Volume: 100.00% 77.30% N/A Classification: Full Thickness Without Exposed Full Thickness Without Exposed N/A Support Structures Support Structures Exudate Amount: None Present Medium N/A Exudate Type: N/A Serosanguineous N/A Exudate Color: N/A red, brown N/A Granulation Amount: None Present (0%) Small (1-33%) N/A Granulation Quality: N/A Pink N/A Necrotic Amount: None Present (0%) Large (67-100%) N/A Exposed Structures: Fascia: No Fat Layer (Subcutaneous Tissue): N/A Fat Layer (Subcutaneous Tissue): Yes No Fascia: No Tendon: No Tendon: No Muscle: No Muscle: No Joint: No Joint: No Bone: No Bone: No Epithelialization: Large (67-100%) None N/A Debridement: N/A Debridement - Excisional N/A Pre-procedure Verification/Time  N/A 13:14 N/A Out Taken: Tissue Debrided: N/A Subcutaneous, Slough N/A Level: N/A Skin/Subcutaneous Tissue N/A Debridement Area (sq cm): N/A 4.5 N/A Instrument: N/A Curette N/A Bleeding: N/A Minimum N/A Hemostasis Achieved: N/A Pressure N/A N/A Procedure was tolerated well N/A MAIARA, GRANER ([id number]) Debridement Treatment Response: Post Debridement N/A 4.5x3x0.2 N/A Measurements L x W x D (cm) Post Debridement Volume: N/A 2.121 N/A (cm) Procedures Performed: N/A Debridement N/A Treatment Notes Electronic Signature(s) Signed: 10/02/2020 5:07:17 PM By: Linton Ham MD Entered By: Linton Ham on 10/02/2020 13:19:12 [name] ([id number]) -------------------------------------------------------------------------------- Multi-Disciplinary Care Plan Details Patient Name: [name]. Date of Service: 10/02/2020 12:30 PM Medical Record Number: [id number] Patient Account Number: [id number] Date of Birth/Sex: 06-Jun-1927 (87 y.o. F) Treating RN: Dolan Amen Primary Care Nakaiya Beddow: Sallee Lange Other  Clinician: Jeanine Luz Referring Jimmylee Ratterree: Sallee Lange Treating Rubyann Lingle/Extender: Tito Dine in Treatment: 2 Active Inactive Abuse / Safety / Falls / Self Care Management Nursing Diagnoses: Potential for injury related to falls Goals: Patient will remain injury free related to falls Date Initiated: 09/15/2020 Target Resolution Date: 10/15/2020 Goal Status: Active Interventions: Assess Activities of Daily Living upon admission and as needed Assess fall risk on admission and as needed Assess: immobility, friction, shearing, incontinence upon admission and as needed Assess impairment of mobility on admission and as needed per policy Assess personal safety and home safety (as indicated) on admission and as needed Assess self care needs on admission and as needed Notes: Nutrition Nursing Diagnoses: Impaired glucose control: actual or potential Goals: Patient/caregiver verbalizes understanding of need to maintain therapeutic glucose control per primary care physician Date Initiated: 09/15/2020 Target Resolution Date: 10/15/2020 Goal Status: Active Interventions: Assess HgA1c results as ordered upon admission and as needed Assess patient nutrition upon admission and as needed per policy Notes: Wound/Skin Impairment Nursing Diagnoses: Knowledge deficit related to ulceration/compromised skin integrity Goals: Patient/caregiver will verbalize understanding of skin care regimen Date Initiated: 09/15/2020 Target Resolution Date: 10/15/2020 Goal Status: Active Ulcer/skin breakdown will have a volume reduction of 30% by week 4 Date Initiated: 09/15/2020 Target Resolution Date: 11/15/2020 Goal Status: Active Ulcer/skin breakdown will have a volume reduction of 50% by week 8 Date Initiated: 09/15/2020 Target Resolution Date: 12/15/2020 Goal Status: Active Ulcer/skin breakdown will have a volume reduction of 80% by week 12 Date Initiated: 09/15/2020 Target Resolution Date:  01/15/2021 JANE, KNOWLDEN ([id number]) Goal Status: Active Ulcer/skin breakdown will heal within 14 weeks Date Initiated: 09/15/2020 Target Resolution Date: 02/15/2021 Goal Status: Active Interventions: Assess patient/caregiver ability to obtain necessary supplies Assess patient/caregiver ability to perform ulcer/skin care regimen upon admission and as needed Assess ulceration(s) every visit Notes: Electronic Signature(s) Signed: 10/02/2020 5:05:31 PM By: Georges Mouse, Minus Breeding RN Entered By: Georges Mouse, Minus Breeding on 10/02/2020 13:14:30 [name] ([id number]) -------------------------------------------------------------------------------- Pain Assessment Details Patient Name: [name]. Date of Service: 10/02/2020 12:30 PM Medical Record Number: [id number] Patient Account Number: [id number] Date of Birth/Sex: 04-Jul-1926 (87 y.o. F) Treating RN: Carlene Coria Primary Care Julisa Flippo: Sallee Lange Other Clinician: Jeanine Luz Referring Tracy Kinner: Sallee Lange Treating Quintana Canelo/Extender: Tito Dine in Treatment: 2 Active Problems Location of Pain Severity and Description of Pain Patient Has Paino No Site Locations Pain Management and Medication Current Pain Management: Electronic Signature(s) Signed: 10/03/2020 8:24:32 AM By: Carlene Coria RN Entered By: Carlene Coria on 10/02/2020 12:51:09 [name] ([id number]) -------------------------------------------------------------------------------- Patient/Caregiver Education Details Patient Name: [name]. Date of Service: 10/02/2020 12:30 PM Medical Record Number: [id number] Patient Account Number:  [id number] Date of Birth/Gender: 25-May-1927 (87 y.o. F) Treating RN: Dolan Amen Primary Care Physician: Sallee Lange Other Clinician: Jeanine Luz Referring Physician: Sallee Lange Treating Physician/Extender: Tito Dine in Treatment: 2 Education  Assessment Education Provided To: Patient Education Topics Provided Wound/Skin Impairment: Methods: Explain/Verbal Responses: State content correctly Electronic Signature(s) Signed: 10/02/2020 5:05:31 PM By: Georges Mouse, Minus Breeding RN Entered By: Georges Mouse, Minus Breeding on 10/02/2020 13:20:11 [name] ([id number]) -------------------------------------------------------------------------------- Wound Assessment Details Patient Name: [name]. Date of Service: 10/02/2020 12:30 PM Medical Record Number: [id number] Patient Account Number: [id number] Date of Birth/Sex: 1927-06-06 (87 y.o. F) Treating RN: Carlene Coria Primary Care Patrecia Veiga: Sallee Lange Other Clinician: Jeanine Luz Referring Selda Jalbert: Sallee Lange Treating Izaih Kataoka/Extender: Tito Dine in Treatment: 2 Wound Status Wound Number: 1 Primary Venous Leg Ulcer Etiology: Wound Location: Left, Medial, Anterior Lower Leg Secondary Lymphedema Wounding Event: Blister Etiology: Date Acquired: 07/08/2020 Wound Healed - Epithelialized Weeks Of Treatment: 2 Status: Clustered Wound: No Comorbid Cataracts, Anemia, Lymphedema, Arrhythmia, Congestive History: Heart Failure, Coronary Artery Disease, Hypertension, Type II Diabetes, Gout, Osteoarthritis Photos Wound Measurements Length: (cm) 0 Width: (cm) 0 Depth: (cm) 0 Area: (cm) 0 Volume: (cm) 0 % Reduction in Area: 100% % Reduction in Volume: 100% Epithelialization: Large (67-100%) Tunneling: No Undermining: No Wound Description Classification: Full Thickness Without Exposed Support Structure Exudate Amount: None Present s Foul Odor After Cleansing: No Slough/Fibrino No Wound Bed Granulation Amount: None Present (0%) Exposed Structure Necrotic Amount: None Present (0%) Fascia Exposed: No Fat Layer (Subcutaneous Tissue) Exposed: No Tendon Exposed: No Muscle Exposed: No Joint Exposed: No Bone Exposed: No Electronic  Signature(s) Signed: 10/02/2020 5:05:31 PM By: Georges Mouse, Minus Breeding RN Signed: 10/03/2020 8:24:32 AM By: Carlene Coria RN Entered By: Georges Mouse, Minus Breeding on 10/02/2020 13:18:30 [name] ([id number]) -------------------------------------------------------------------------------- Wound Assessment Details Patient Name: [name]. Date of Service: 10/02/2020 12:30 PM Medical Record Number: [id number] Patient Account Number: [id number] Date of Birth/Sex: 1926/07/24 (87 y.o. F) Treating RN: Carlene Coria Primary Care Eriel Dunckel: Sallee Lange Other Clinician: Jeanine Luz Referring Tod Abrahamsen: Sallee Lange Treating Rishikesh Khachatryan/Extender: Tito Dine in Treatment: 2 Wound Status Wound Number: 2 Primary Venous Leg Ulcer Etiology: Wound Location: Right, Medial, Anterior Lower Leg Secondary Lymphedema Wounding Event: Blister Etiology: Date Acquired: 07/08/2020 Wound Open Weeks Of Treatment: 2 Status: Clustered Wound: No Comorbid Cataracts, Anemia, Lymphedema, Arrhythmia, Congestive History: Heart Failure, Coronary Artery Disease, Hypertension, Type II Diabetes, Gout, Osteoarthritis Photos Wound Measurements Length: (cm) 4.5 Width: (cm) 3 Depth: (cm) 0.1 Area: (cm) 10.603 Volume: (cm) 1.06 % Reduction in Area: 77.3% % Reduction in Volume: 77.3% Epithelialization: None Tunneling: No Undermining: No Wound Description Classification: Full Thickness Without Exposed Support Structures Exudate Amount: Medium Exudate Type: Serosanguineous Exudate Color: red, brown Foul Odor After Cleansing: No Slough/Fibrino Yes Wound Bed Granulation Amount: Small (1-33%) Exposed Structure Granulation Quality: Pink Fascia Exposed: No Necrotic Amount: Large (67-100%) Fat Layer (Subcutaneous Tissue) Exposed: Yes Necrotic Quality: Adherent Slough Tendon Exposed: No Muscle Exposed: No Joint Exposed: No Bone Exposed: No Treatment Notes Wound #2 (Lower Leg) Wound  Laterality: Right, Medial, Anterior Cleanser Normal Saline Discharge Instruction: Wash your hands with soap and water. Remove old dressing, discard into plastic bag and place into trash. Cleanse the wound with Normal Saline prior to applying a clean dressing using gauze sponges, not tissues or cotton balls. Do not KERRIANN, SCHNACKENBERG. ([id number]) scrub or use excessive force. Pat dry using gauze sponges, not tissue or cotton balls. Peri-Wound Care Topical Primary  Dressing Hydrofera Blue Ready Transfer Foam, 2.5x2.5 (in/in) Discharge Instruction: Apply Hydrofera Blue Ready to wound bed as directed Secondary Dressing ABD Pad 5x9 (in/in) Discharge Instruction: Cover with ABD pad Secured With Compression Wrap Profore Lite LF 3 Multilayer Compression Bandaging System Discharge Instruction: Apply 3 multi-layer wrap as prescribed. Compression Stockings Add-Ons Electronic Signature(s) Signed: 10/03/2020 8:24:32 AM By: Carlene Coria RN Entered By: Carlene Coria on 10/02/2020 13:09:00 [name] ([id number]) -------------------------------------------------------------------------------- Vitals Details Patient Name: [name]. Date of Service: 10/02/2020 12:30 PM Medical Record Number: [id number] Patient Account Number: [id number] Date of Birth/Sex: 1926/10/29 (87 y.o. F) Treating RN: Carlene Coria Primary Care Kita Neace: Sallee Lange Other Clinician: Jeanine Luz Referring Patryk Conant: Sallee Lange Treating Jahquez Steffler/Extender: Tito Dine in Treatment: 2 Vital Signs Time Taken: 12:50 Temperature (F): 98 Height (in): 62 Pulse (bpm): 67 Weight (lbs): 166 Respiratory Rate (breaths/min): 16 Body Mass Index (BMI): 30.4 Blood Pressure (mmHg): 153/52 Reference Range: 80 - 120 mg / dl Electronic Signature(s) Signed: 10/03/2020 8:24:32 AM By: Carlene Coria RN Entered By: Carlene Coria on 10/02/2020 12:51:03

## 2020-10-06 DIAGNOSIS — L97822 Non-pressure chronic ulcer of other part of left lower leg with fat layer exposed: Secondary | ICD-10-CM | POA: Diagnosis not present

## 2020-10-06 DIAGNOSIS — L97812 Non-pressure chronic ulcer of other part of right lower leg with fat layer exposed: Secondary | ICD-10-CM | POA: Diagnosis not present

## 2020-10-06 DIAGNOSIS — E1151 Type 2 diabetes mellitus with diabetic peripheral angiopathy without gangrene: Secondary | ICD-10-CM | POA: Diagnosis not present

## 2020-10-06 DIAGNOSIS — I130 Hypertensive heart and chronic kidney disease with heart failure and stage 1 through stage 4 chronic kidney disease, or unspecified chronic kidney disease: Secondary | ICD-10-CM | POA: Diagnosis not present

## 2020-10-06 DIAGNOSIS — Z4800 Encounter for change or removal of nonsurgical wound dressing: Secondary | ICD-10-CM | POA: Diagnosis not present

## 2020-10-06 DIAGNOSIS — I890 Lymphedema, not elsewhere classified: Secondary | ICD-10-CM | POA: Diagnosis not present

## 2020-10-06 DIAGNOSIS — I509 Heart failure, unspecified: Secondary | ICD-10-CM | POA: Diagnosis not present

## 2020-10-06 DIAGNOSIS — I872 Venous insufficiency (chronic) (peripheral): Secondary | ICD-10-CM | POA: Diagnosis not present

## 2020-10-06 DIAGNOSIS — E11622 Type 2 diabetes mellitus with other skin ulcer: Secondary | ICD-10-CM | POA: Diagnosis not present

## 2020-10-09 DIAGNOSIS — I509 Heart failure, unspecified: Secondary | ICD-10-CM | POA: Diagnosis not present

## 2020-10-09 DIAGNOSIS — L97812 Non-pressure chronic ulcer of other part of right lower leg with fat layer exposed: Secondary | ICD-10-CM | POA: Diagnosis not present

## 2020-10-09 DIAGNOSIS — L97822 Non-pressure chronic ulcer of other part of left lower leg with fat layer exposed: Secondary | ICD-10-CM | POA: Diagnosis not present

## 2020-10-09 DIAGNOSIS — I130 Hypertensive heart and chronic kidney disease with heart failure and stage 1 through stage 4 chronic kidney disease, or unspecified chronic kidney disease: Secondary | ICD-10-CM | POA: Diagnosis not present

## 2020-10-09 DIAGNOSIS — E11622 Type 2 diabetes mellitus with other skin ulcer: Secondary | ICD-10-CM | POA: Diagnosis not present

## 2020-10-09 DIAGNOSIS — I872 Venous insufficiency (chronic) (peripheral): Secondary | ICD-10-CM | POA: Diagnosis not present

## 2020-10-09 DIAGNOSIS — Z4800 Encounter for change or removal of nonsurgical wound dressing: Secondary | ICD-10-CM | POA: Diagnosis not present

## 2020-10-09 DIAGNOSIS — I890 Lymphedema, not elsewhere classified: Secondary | ICD-10-CM | POA: Diagnosis not present

## 2020-10-09 DIAGNOSIS — E1151 Type 2 diabetes mellitus with diabetic peripheral angiopathy without gangrene: Secondary | ICD-10-CM | POA: Diagnosis not present

## 2020-10-10 ENCOUNTER — Telehealth: Payer: Self-pay

## 2020-10-10 NOTE — Telephone Encounter (Signed)
Error

## 2020-10-10 NOTE — Telephone Encounter (Signed)
The home health nurse from [facility] called and said that she went to [name]'s house yesterday and she complained of having some leg pain.  I called the home number and was unable to leave a message so I called [name]'s number and left a message that if the patient was still having leg pain to give our office a call and we would get her in for an appt.

## 2020-10-13 ENCOUNTER — Other Ambulatory Visit: Payer: Self-pay

## 2020-10-13 ENCOUNTER — Encounter: Payer: Medicare HMO | Attending: Physician Assistant | Admitting: Physician Assistant

## 2020-10-13 DIAGNOSIS — N184 Chronic kidney disease, stage 4 (severe): Secondary | ICD-10-CM | POA: Diagnosis not present

## 2020-10-13 DIAGNOSIS — I480 Paroxysmal atrial fibrillation: Secondary | ICD-10-CM | POA: Diagnosis not present

## 2020-10-13 DIAGNOSIS — M1990 Unspecified osteoarthritis, unspecified site: Secondary | ICD-10-CM | POA: Diagnosis not present

## 2020-10-13 DIAGNOSIS — I509 Heart failure, unspecified: Secondary | ICD-10-CM | POA: Insufficient documentation

## 2020-10-13 DIAGNOSIS — Z950 Presence of cardiac pacemaker: Secondary | ICD-10-CM | POA: Diagnosis not present

## 2020-10-13 DIAGNOSIS — I872 Venous insufficiency (chronic) (peripheral): Secondary | ICD-10-CM | POA: Diagnosis not present

## 2020-10-13 DIAGNOSIS — E11622 Type 2 diabetes mellitus with other skin ulcer: Secondary | ICD-10-CM | POA: Insufficient documentation

## 2020-10-13 DIAGNOSIS — I890 Lymphedema, not elsewhere classified: Secondary | ICD-10-CM | POA: Insufficient documentation

## 2020-10-13 DIAGNOSIS — L97821 Non-pressure chronic ulcer of other part of left lower leg limited to breakdown of skin: Secondary | ICD-10-CM | POA: Insufficient documentation

## 2020-10-13 DIAGNOSIS — E1122 Type 2 diabetes mellitus with diabetic chronic kidney disease: Secondary | ICD-10-CM | POA: Insufficient documentation

## 2020-10-13 DIAGNOSIS — L97812 Non-pressure chronic ulcer of other part of right lower leg with fat layer exposed: Secondary | ICD-10-CM | POA: Diagnosis not present

## 2020-10-13 DIAGNOSIS — M109 Gout, unspecified: Secondary | ICD-10-CM | POA: Diagnosis not present

## 2020-10-13 DIAGNOSIS — L97811 Non-pressure chronic ulcer of other part of right lower leg limited to breakdown of skin: Secondary | ICD-10-CM | POA: Diagnosis not present

## 2020-10-13 DIAGNOSIS — I130 Hypertensive heart and chronic kidney disease with heart failure and stage 1 through stage 4 chronic kidney disease, or unspecified chronic kidney disease: Secondary | ICD-10-CM | POA: Insufficient documentation

## 2020-10-13 DIAGNOSIS — I2510 Atherosclerotic heart disease of native coronary artery without angina pectoris: Secondary | ICD-10-CM | POA: Insufficient documentation

## 2020-10-13 NOTE — Progress Notes (Addendum)
SAROYA, ADAMS ([id number]) Visit Report for 10/13/2020 Chief Complaint Document Details Patient Name: [name], [name]. Date of Service: 10/13/2020 12:30 PM Medical Record Number: [id number] Patient Account Number: [id number] Date of Birth/Sex: 28-Sep-1926 (87 y.o. F) Treating RN: Donnamarie Poag Primary Care Provider: Sallee Lange Other Clinician: Referring Provider: Sallee Lange Treating Provider/Extender: Skipper Cliche in Treatment: 4 Information Obtained from: Patient Chief Complaint Bilateral LE Ulcers Electronic Signature(s) Signed: 10/13/2020 1:03:40 PM By: Worthy Keeler PA-C Entered By: Worthy Keeler on 10/13/2020 13:03:40 [name] ([id number]) -------------------------------------------------------------------------------- HPI Details Patient Name: [name]. Date of Service: 10/13/2020 12:30 PM Medical Record Number: [id number] Patient Account Number: [id number] Date of Birth/Sex: 03-Jun-1927 (87 y.o. F) Treating RN: Donnamarie Poag Primary Care Provider: Sallee Lange Other Clinician: Referring Provider: Sallee Lange Treating Provider/Extender: Skipper Cliche in Treatment: 4 History of Present Illness HPI Description: 09/15/20 upon evaluation today patient presents for initial inspection here in our clinic concerning issues she has been having with blisters on the lower extremities bilaterally. She tells me that this began around the beginning of February. With that being said she does have a longstanding history of lymphedema she tells me. She also has chronic kidney disease stage IV which is causing some swelling as well her nephrologist did place her recently on Lasix which has helped to some degree. Her wounds actually appear to be healing which is great news. The patient does have a history of chronic venous insufficiency, lymphedema, diabetes mellitus type 2 which is controlled with diet she is not on any medications her last hemoglobin A1c was  5.6. She also has atrial fibrillation, cardiac pacemaker, and again stage IV kidney disease. 09/22/2020 upon evaluation today patient appears to be doing well with regard to her wounds. She has been tolerating the dressing changes without complication. With that being said even in the past week she has made a dramatic improvement even though apparently the home health nurse actually placed her in an Unna boot wrap and not an actual 3 layer compression wrap. I suspect this was due to the fact that the patient did not have the medications needed to appropriately apply the dressings at home. This is fine but the nurse did not contact us to let me know. 4/28; the patient's left leg wounds on the medial aspect are healed on the right the wound that we had is measuring smaller but our nurse reports a new area in the original blistered area. We have been using alginate 10/13/2020 on evaluation today patient's wound is actually showing signs of good improvement. Fortunately there is no evidence of active infection at this time which is great news and overall very pleased with where things stand. No fevers, chills, nausea, vomiting, or diarrhea. Electronic Signature(s) Signed: 10/13/2020 1:13:32 PM By: Worthy Keeler PA-C Entered By: Worthy Keeler on 10/13/2020 13:13:31 [name] ([id number]) -------------------------------------------------------------------------------- Physical Exam Details Patient Name: [name]. Date of Service: 10/13/2020 12:30 PM Medical Record Number: [id number] Patient Account Number: [id number] Date of Birth/Sex: 11-30-1926 (87 y.o. F) Treating RN: Donnamarie Poag Primary Care Provider: Sallee Lange Other Clinician: Referring Provider: Sallee Lange Treating Provider/Extender: Skipper Cliche in Treatment: 4 Constitutional Well-nourished and well-hydrated in no acute distress. Respiratory normal breathing without difficulty. Psychiatric this patient is able  to make decisions and demonstrates good insight into disease process. Alert and Oriented x 3. pleasant and cooperative. Notes Patient's wound bed did show signs of good granulation epithelization this is smaller than  last week and does not appear to be as irritated as it was last week either. Overall I am extremely pleased with where we are standing today. The patient is also very happy with the fact that this is doing better. No sharp debridement was necessary today. Electronic Signature(s) Signed: 10/13/2020 1:13:51 PM By: Worthy Keeler PA-C Entered By: Worthy Keeler on 10/13/2020 13:13:50 [name] ([id number]) -------------------------------------------------------------------------------- Physician Orders Details Patient Name: [name]. Date of Service: 10/13/2020 12:30 PM Medical Record Number: [id number] Patient Account Number: [id number] Date of Birth/Sex: 11/08/1926 (87 y.o. F) Treating RN: Donnamarie Poag Primary Care Provider: Sallee Lange Other Clinician: Referring Provider: Sallee Lange Treating Provider/Extender: Skipper Cliche in Treatment: 4 Verbal / Phone Orders: No Diagnosis Coding ICD-10 Coding Code Description I89.0 Lymphedema, not elsewhere classified I87.2 Venous insufficiency (chronic) (peripheral) L97.821 Non-pressure chronic ulcer of other part of left lower leg limited to breakdown of skin L97.811 Non-pressure chronic ulcer of other part of right lower leg limited to breakdown of skin E11.622 Type 2 diabetes mellitus with other skin ulcer I48.0 Paroxysmal atrial fibrillation Z95.0 Presence of cardiac pacemaker N18.4 Chronic kidney disease, stage 4 (severe) Follow-up Appointments o Return Appointment in 1 week. [place] for wound care. May utilize formulary equivalent dressing for wound treatment orders unless otherwise specified. Home Health Nurse may visit PRN to address patientos wound care needs. -  [place] o Scheduled days for dressing changes to be completed; exception, patient has scheduled wound care visit that day. - 2 x a week- Monday/Thursday or Tuesday/Friday o **Please direct any NON-WOUND related issues/requests for orders to patient's Primary Care Physician. **If current dressing causes regression in wound condition, may D/C ordered dressing product/s and apply Normal Saline Moist Dressing daily until next [place] or Other MD appointment. **Notify [facility] of regression in wound condition at [phone]. Bathing/ Shower/ Hygiene o May shower with wound dressing protected with water repellent cover or cast protector. Edema Control - Lymphedema / Segmental Compressive Device / Other Right Lower Extremity o Optional: One layer of unna paste to top of compression wrap (to act as an anchor). o 3 Layer Compression System for Lymphedema. o Patient to wear own Velcro compression garment. Remove compression stockings every night before going to bed and put on every morning when getting up. - Left extremity-Farrow wrap o Elevate, Exercise Daily and Avoid Standing for Long Periods of Time. o Elevate legs to the level of the heart and pump ankles as often as possible o Elevate leg(s) parallel to the floor when sitting. Wound Treatment Wound #2 - Lower Leg Wound Laterality: Right, Medial, Anterior Cleanser: Normal Saline (Generic) 2 x Per Week/30 Days Discharge Instructions: Wash your hands with soap and water. Remove old dressing, discard into plastic bag and place into trash. Cleanse the wound with Normal Saline prior to applying a clean dressing using gauze sponges, not tissues or cotton balls. Do not scrub or use excessive force. Pat dry using gauze sponges, not tissue or cotton balls. Primary Dressing: Hydrofera Blue Ready Transfer Foam, 2.5x2.5 (in/in) 2 x Per Week/30 Days Discharge Instructions: Apply Hydrofera Blue Ready to wound bed as  directed Secondary Dressing: ABD Pad 5x9 (in/in) (Generic) 2 x Per Week/30 Days Discharge Instructions: Cover with ABD pad Compression Wrap: Profore Lite LF 3 Multilayer Compression Bandaging System (Generic) 2 x Per Week/30 Days Discharge Instructions: Apply 3 multi-layer wrap as prescribed. [name], [name] ([id number]) Electronic Signature(s) Signed: 10/13/2020 5:00:28  PM By: Donnamarie Poag Signed: 10/13/2020 6:51:50 PM By: Worthy Keeler PA-C Entered By: Donnamarie Poag on 10/13/2020 13:10:39 [name] ([id number]) -------------------------------------------------------------------------------- Problem List Details Patient Name: [name], [name]. Date of Service: 10/13/2020 12:30 PM Medical Record Number: [id number] Patient Account Number: [id number] Date of Birth/Sex: 23-Feb-1927 (87 y.o. F) Treating RN: Donnamarie Poag Primary Care Provider: Sallee Lange Other Clinician: Referring Provider: Sallee Lange Treating Provider/Extender: Skipper Cliche in Treatment: 4 Active Problems ICD-10 Encounter Code Description Active Date MDM Diagnosis I89.0 Lymphedema, not elsewhere classified 09/15/2020 No Yes I87.2 Venous insufficiency (chronic) (peripheral) 09/15/2020 No Yes L97.821 Non-pressure chronic ulcer of other part of left lower leg limited to 09/15/2020 No Yes breakdown of skin L97.811 Non-pressure chronic ulcer of other part of right lower leg limited to 09/15/2020 No Yes breakdown of skin E11.622 Type 2 diabetes mellitus with other skin ulcer 09/15/2020 No Yes I48.0 Paroxysmal atrial fibrillation 09/15/2020 No Yes Z95.0 Presence of cardiac pacemaker 09/15/2020 No Yes N18.4 Chronic kidney disease, stage 4 (severe) 09/15/2020 No Yes Inactive Problems Resolved Problems Electronic Signature(s) Signed: 10/13/2020 1:03:34 PM By: Worthy Keeler PA-C Entered By: Worthy Keeler on 10/13/2020 13:03:34 [name]  ([id number]) -------------------------------------------------------------------------------- Progress Note Details Patient Name: [name]. Date of Service: 10/13/2020 12:30 PM Medical Record Number: [id number] Patient Account Number: [id number] Date of Birth/Sex: [birth year] (87 y.o. F) Treating RN: Donnamarie Poag Primary Care Provider: Sallee Lange Other Clinician: Referring Provider: Sallee Lange Treating Provider/Extender: Skipper Cliche in Treatment: 4 Subjective Chief Complaint Information obtained from Patient Bilateral LE Ulcers History of Present Illness (HPI) 09/15/20 upon evaluation today patient presents for initial inspection here in our clinic concerning issues she has been having with blisters on the lower extremities bilaterally. She tells me that this began around the beginning of February. With that being said she does have a longstanding history of lymphedema she tells me. She also has chronic kidney disease stage IV which is causing some swelling as well her nephrologist did place her recently on Lasix which has helped to some degree. Her wounds actually appear to be healing which is great news. The patient does have a history of chronic venous insufficiency, lymphedema, diabetes mellitus type 2 which is controlled with diet she is not on any medications her last hemoglobin A1c was 5.6. She also has atrial fibrillation, cardiac pacemaker, and again stage IV kidney disease. 09/22/2020 upon evaluation today patient appears to be doing well with regard to her wounds. She has been tolerating the dressing changes without complication. With that being said even in the past week she has made a dramatic improvement even though apparently the home health nurse actually placed her in an Unna boot wrap and not an actual 3 layer compression wrap. I suspect this was due to the fact that the patient did not have the medications needed to appropriately apply the dressings at home.  This is fine but the nurse did not contact us to let me know. 4/28; the patient's left leg wounds on the medial aspect are healed on the right the wound that we had is measuring smaller but our nurse reports a new area in the original blistered area. We have been using alginate 10/13/2020 on evaluation today patient's wound is actually showing signs of good improvement. Fortunately there is no evidence of active infection at this time which is great news and overall very pleased with where things stand. No fevers, chills, nausea, vomiting, or diarrhea. Objective Constitutional Well-nourished and well-hydrated  in no acute distress. Vitals Time Taken: 12:48 PM, Height: 62 in, Weight: 166 lbs, BMI: 30.4, Temperature: 99.3 F, Pulse: 73 bpm, Respiratory Rate: 18 breaths/min, Blood Pressure: 156/71 mmHg. Respiratory normal breathing without difficulty. Psychiatric this patient is able to make decisions and demonstrates good insight into disease process. Alert and Oriented x 3. pleasant and cooperative. General Notes: Patient's wound bed did show signs of good granulation epithelization this is smaller than last week and does not appear to be as irritated as it was last week either. Overall I am extremely pleased with where we are standing today. The patient is also very happy with the fact that this is doing better. No sharp debridement was necessary today. Integumentary (Hair, Skin) Wound #2 status is Open. Original cause of wound was Blister. The date acquired was: 07/08/2020. The wound has been in treatment 4 weeks. The wound is located on the Right,Medial,Anterior Lower Leg. The wound measures 2.9cm length x 1.3cm width x 0.1cm depth; 2.961cm^2 area and 0.296cm^3 volume. There is Fat Layer (Subcutaneous Tissue) exposed. There is no tunneling or undermining noted. There is a medium amount of serosanguineous drainage noted. There is medium (34-66%) pink granulation within the wound bed. There is a  medium (34-66%) amount of necrotic tissue within the wound bed including Adherent Slough. Assessment [name], [name] ([id number]) Active Problems ICD-10 Lymphedema, not elsewhere classified Venous insufficiency (chronic) (peripheral) Non-pressure chronic ulcer of other part of left lower leg limited to breakdown of skin Non-pressure chronic ulcer of other part of right lower leg limited to breakdown of skin Type 2 diabetes mellitus with other skin ulcer Paroxysmal atrial fibrillation Presence of cardiac pacemaker Chronic kidney disease, stage 4 (severe) Procedures Wound #2 Pre-procedure diagnosis of Wound #2 is a Venous Leg Ulcer located on the Right,Medial,Anterior Lower Leg . There was a Three Layer Compression Therapy Procedure by Donnamarie Poag, RN. Post procedure Diagnosis Wound #2: Same as Pre-Procedure Plan Follow-up Appointments: Return Appointment in 1 week. Home Health: [facility] for wound care. May utilize formulary equivalent dressing for wound treatment orders unless otherwise specified. Home Health Nurse may visit PRN to address patient s wound care needs. - [facility] Scheduled days for dressing changes to be completed; exception, patient has scheduled wound care visit that day. - 2 x a week- Monday/Thursday or Tuesday/Friday **Please direct any NON-WOUND related issues/requests for orders to patient's Primary Care Physician. **If current dressing causes regression in wound condition, may D/C ordered dressing product/s and apply Normal Saline Moist Dressing daily until next [place] or Other MD appointment. **Notify [facility] of regression in wound condition at [phone]. Bathing/ Shower/ Hygiene: May shower with wound dressing protected with water repellent cover or cast protector. Edema Control - Lymphedema / Segmental Compressive Device / Other: Optional: One layer of unna paste to top of compression wrap (to act as an anchor). 3  Layer Compression System for Lymphedema. Patient to wear own Velcro compression garment. Remove compression stockings every night before going to bed and put on every morning when getting up. - Left extremity-Farrow wrap Elevate, Exercise Daily and Avoid Standing for Long Periods of Time. Elevate legs to the level of the heart and pump ankles as often as possible Elevate leg(s) parallel to the floor when sitting. WOUND #2: - Lower Leg Wound Laterality: Right, Medial, Anterior Cleanser: Normal Saline (Generic) 2 x Per Week/30 Days Discharge Instructions: Wash your hands with soap and water. Remove old dressing, discard into plastic bag and place  into trash. Cleanse the wound with Normal Saline prior to applying a clean dressing using gauze sponges, not tissues or cotton balls. Do not scrub or use excessive force. Pat dry using gauze sponges, not tissue or cotton balls. Primary Dressing: Hydrofera Blue Ready Transfer Foam, 2.5x2.5 (in/in) 2 x Per Week/30 Days Discharge Instructions: Apply Hydrofera Blue Ready to wound bed as directed Secondary Dressing: ABD Pad 5x9 (in/in) (Generic) 2 x Per Week/30 Days Discharge Instructions: Cover with ABD pad Compression Wrap: Profore Lite LF 3 Multilayer Compression Bandaging System (Generic) 2 x Per Week/30 Days Discharge Instructions: Apply 3 multi-layer wrap as prescribed. 1. I would recommend currently that we go ahead and continue with the wound care measures as before which includes the use of Hydrofera Blue to the wound bed which I think is doing quite well. 2. I would recommend that we cover with an ABD pad as well. 3. We will wrap this with a 3 layer compression wrap. We will see patient back for reevaluation in 1 week here in the clinic. If anything worsens or changes patient will contact our office for additional recommendations. [name], [name] ([id number]) Electronic Signature(s) Signed: 10/13/2020 1:14:23 PM By: Worthy Keeler  PA-C Entered By: Worthy Keeler on 10/13/2020 13:14:23 [name], [name] ([id number]) -------------------------------------------------------------------------------- SuperBill Details Patient Name: [name]. Date of Service: 10/13/2020 Medical Record Number: [id number] Patient Account Number: [id number] Date of Birth/Sex: 07/30/1926 (87 y.o. F) Treating RN: Donnamarie Poag Primary Care Provider: Sallee Lange Other Clinician: Referring Provider: Sallee Lange Treating Provider/Extender: Skipper Cliche in Treatment: 4 Diagnosis Coding ICD-10 Codes Code Description I89.0 Lymphedema, not elsewhere classified I87.2 Venous insufficiency (chronic) (peripheral) L97.821 Non-pressure chronic ulcer of other part of left lower leg limited to breakdown of skin L97.811 Non-pressure chronic ulcer of other part of right lower leg limited to breakdown of skin E11.622 Type 2 diabetes mellitus with other skin ulcer I48.0 Paroxysmal atrial fibrillation Z95.0 Presence of cardiac pacemaker N18.4 Chronic kidney disease, stage 4 (severe) Facility Procedures CPT4 Code: [id number] Description: (Facility Use Only) [phone] - [place] LWR RT LEG Modifier: Quantity: 1 Physician Procedures CPT4 [id number] Description: [id number] - WC PHYS LEVEL 3 - EST PT Modifier: Quantity: 1 CPT4 Code: Description: ICD-10 Diagnosis Description I89.0 Lymphedema, not elsewhere classified I87.2 Venous insufficiency (chronic) (peripheral) L97.811 Non-pressure chronic ulcer of other part of right lower leg limited to br Modifier: [place] of skin Quantity: Electronic Signature(s) Signed: 10/13/2020 1:14:43 PM By: Worthy Keeler PA-C Entered By: Worthy Keeler on 10/13/2020 13:14:43

## 2020-10-13 NOTE — Progress Notes (Signed)
BICH, MCHANEY ([id number]) Visit Report for 10/13/2020 Arrival Information Details Patient Name: [name], [name]. Date of Service: 10/13/2020 12:30 PM Medical Record Number: [id number] Patient Account Number: [id number] Date of Birth/Sex: Jan 04, 1927 (87 y.o. F) Treating RN: Dolan Amen Primary Care Jaythan Hinely: Sallee Lange Other Clinician: Referring Dak Szumski: Sallee Lange Treating Billyjack Trompeter/Extender: Skipper Cliche in Treatment: 4 Visit Information History Since Last Visit Pain Present Now: No Patient Arrived: Wheel Chair Arrival Time: 12:48 Accompanied By: daughter Transfer Assistance: Manual Patient Identification Verified: Yes Secondary Verification Process Yes Completed: Patient Has Alerts: Yes Patient Alerts: Patient on Blood Thinner on COUMADIN DIABETIC NON COMPRESSIBLE BIL LEGS Electronic Signature(s) Signed: 10/13/2020 5:15:51 PM By: Georges Mouse, Minus Breeding RN Entered By: Georges Mouse, Minus Breeding on 10/13/2020 12:48:37 [name] ([id number]) -------------------------------------------------------------------------------- Clinic Level of Care Assessment Details Patient Name: [name]. Date of Service: 10/13/2020 12:30 PM Medical Record Number: [id number] Patient Account Number: [id number] Date of Birth/Sex: 08-15-1926 (87 y.o. F) Treating RN: Donnamarie Poag Primary Care Aadvika Konen: Sallee Lange Other Clinician: Referring Jordyan Hardiman: Sallee Lange Treating Gilda Abboud/Extender: Skipper Cliche in Treatment: 4 Clinic Level of Care Assessment Items TOOL 1 Quantity Score _0  - Use when EandM and Procedure is performed on INITIAL visit 0 ASSESSMENTS - Nursing Assessment / Reassessment _1  - General Physical Exam (combine w/ comprehensive assessment (listed just below) when performed on new 0 pt. evals) _2  - 0 Comprehensive Assessment (HX, ROS, Risk Assessments, Wounds Hx, etc.) ASSESSMENTS - Wound and Skin Assessment / Reassessment _3  - Dermatologic /  Skin Assessment (not related to wound area) 0 ASSESSMENTS - Ostomy and/or Continence Assessment and Care _4  - Incontinence Assessment and Management 0 _5  - 0 Ostomy Care Assessment and Management (repouching, etc.) PROCESS - Coordination of Care _6  - Simple Patient / Family Education for ongoing care 0 _7  - 0 Complex (extensive) Patient / Family Education for ongoing care _8  - 0 Staff obtains Programmer, systems, Records, Test Results / Process Orders _9  - 0 Staff telephones HHA, Nursing Homes / Clarify orders / etc _10  - 0 Routine Transfer to another Facility (non-emergent condition) _11  - 0 Routine Hospital Admission (non-emergent condition) _12  - 0 New Admissions / Biomedical engineer / Ordering NPWT, Apligraf, etc. _13  - 0 Emergency Hospital Admission (emergent condition) PROCESS - Special Needs _14  - Pediatric / Minor Patient Management 0 _15  - 0 Isolation Patient Management _16  - 0 Hearing / Language / Visual special needs _17  - 0 Assessment of Community assistance (transportation, D/C planning, etc.) _18  - 0 Additional assistance / Altered mentation _19  - 0 Support Surface(s) Assessment (bed, cushion, seat, etc.) INTERVENTIONS - Miscellaneous _20  - External ear exam 0 _21  - 0 Patient Transfer (multiple staff / Civil Service fast streamer / Similar devices) _22  - 0 Simple Staple / Suture removal (25 or less) _23  - 0 Complex Staple / Suture removal (26 or more) _24  - 0 Hypo/Hyperglycemic Management (do not check if billed separately) _25  - 0 Ankle / Brachial Index (ABI) - do not check if billed separately Has the patient been seen at the hospital within the last three years: Yes Total Score: 0 Level Of Care: ____ [name] ([id number]) Electronic Signature(s) Signed: 10/13/2020 5:00:28 PM By: Donnamarie Poag Entered By: Donnamarie Poag on 10/13/2020 13:10:46 [name] ([id number]) -------------------------------------------------------------------------------- Compression Therapy  Details Patient Name: [name]. Date of Service: 10/13/2020 12:30 PM Medical Record Number: [id number] Patient Account Number: [id number] Date of Birth/Sex: Nov 13, 1926 (87 y.o. F) Treating RN: Donnamarie Poag Primary Care Jaemarie Hochberg: Sallee Lange Other Clinician:  Referring Keishla Oyer: Sallee Lange Treating Albie Arizpe/Extender: Skipper Cliche in Treatment: 4 Compression Therapy Performed for Wound Assessment: Wound #2 Right,Medial,Anterior Lower Leg Performed By: Junius Argyle, RN Compression Type: Three Layer Post Procedure Diagnosis Same as Pre-procedure Electronic Signature(s) Signed: 10/13/2020 5:00:28 PM By: Donnamarie Poag Entered By: Donnamarie Poag on 10/13/2020 13:07:57 [name] ([id number]) -------------------------------------------------------------------------------- Lower Extremity Assessment Details Patient Name: [name]. Date of Service: 10/13/2020 12:30 PM Medical Record Number: [id number] Patient Account Number: [id number] Date of Birth/Sex: 04-Mar-1927 (87 y.o. F) Treating RN: Dolan Amen Primary Care Jalee Saine: Sallee Lange Other Clinician: Referring Jamella Grayer: Sallee Lange Treating Zain Lankford/Extender: Jeri Cos Weeks in Treatment: 4 Edema Assessment Assessed: [Left: Yes] [Right: No] Edema: [Left: Ye] [Right: s] Calf Left: Right: Point of Measurement: 31 cm From Medial Instep 37.5 cm Ankle Left: Right: Point of Measurement: 9 cm From Medial Instep 19.5 cm Vascular Assessment Pulses: Dorsalis Pedis Palpable: [Left:No Yes] Electronic Signature(s) Signed: 10/13/2020 5:15:51 PM By: Georges Mouse, Minus Breeding RN Entered By: Georges Mouse, Minus Breeding on 10/13/2020 13:00:34 [name] ([id number]) -------------------------------------------------------------------------------- Multi Wound Chart Details Patient Name: [name]. Date of Service: 10/13/2020 12:30 PM Medical Record Number: [id number] Patient Account Number:  [id number] Date of Birth/Sex: 10-Mar-1927 (87 y.o. F) Treating RN: Donnamarie Poag Primary Care Aiden Helzer: Sallee Lange Other Clinician: Referring Khameron Gruenwald: Sallee Lange Treating Haruna Rohlfs/Extender: Skipper Cliche in Treatment: 4 Vital Signs Height(in): 15 Pulse(bpm): 18 Weight(lbs): 166 Blood Pressure(mmHg): 156/71 Body Mass Index(BMI): 30 Temperature(F): 99.3 Respiratory Rate(breaths/min): 18 Photos: [N/A:N/A] Wound Location: Right, Medial, Anterior Lower Leg N/A N/A Wounding Event: Blister N/A N/A Primary Etiology: Venous Leg Ulcer N/A N/A Secondary Etiology: Lymphedema N/A N/A Comorbid History: Cataracts, Anemia, Lymphedema, N/A N/A Arrhythmia, Congestive Heart Failure, Coronary Artery Disease, Hypertension, Type II Diabetes, Gout, Osteoarthritis Date Acquired: 07/08/2020 N/A N/A Weeks of Treatment: 4 N/A N/A Wound Status: Open N/A N/A Measurements L x W x D (cm) 2.9x1.3x0.1 N/A N/A Area (cm) : 2.961 N/A N/A Volume (cm) : 0.296 N/A N/A % Reduction in Area: 93.70% N/A N/A % Reduction in Volume: 93.70% N/A N/A Classification: Full Thickness Without Exposed N/A N/A Support Structures Exudate Amount: Medium N/A N/A Exudate Type: Serosanguineous N/A N/A Exudate Color: red, brown N/A N/A Granulation Amount: Medium (34-66%) N/A N/A Granulation Quality: Pink N/A N/A Necrotic Amount: Medium (34-66%) N/A N/A Exposed Structures: Fat Layer (Subcutaneous Tissue): N/A N/A Yes Fascia: No Tendon: No Muscle: No Joint: No Bone: No Epithelialization: Small (1-33%) N/A N/A Treatment Notes Electronic Signature(s) Signed: 10/13/2020 5:00:28 PM By: Donnamarie Poag Entered By: Donnamarie Poag on 10/13/2020 13:06:49 [name] ([id number] White ([id number]) -------------------------------------------------------------------------------- [place] Details Patient Name: [name]. Date of Service: 10/13/2020 12:30 PM Medical Record Number:  [id number] Patient Account Number: [id number] Date of Birth/Sex: 1926/07/09 (87 y.o. F) Treating RN: Donnamarie Poag Primary Care Arieliz Latino: Sallee Lange Other Clinician: Referring Puneet Selden: Sallee Lange Treating Ota Ebersole/Extender: Skipper Cliche in Treatment: 4 Active Inactive Wound/Skin Impairment Nursing Diagnoses: Knowledge deficit related to ulceration/compromised skin integrity Goals: Patient/caregiver will verbalize understanding of skin care regimen Date Initiated: 09/15/2020 Date Inactivated: 10/13/2020 Target Resolution Date: 10/15/2020 Goal Status: Met Ulcer/skin breakdown will have a volume reduction of 30% by week 4 Date Initiated: 09/15/2020 Target Resolution Date: 11/15/2020 Goal Status: Active Ulcer/skin breakdown will have a volume reduction of 50% by week 8 Date Initiated: 09/15/2020 Target Resolution Date: 12/15/2020 Goal Status: Active Ulcer/skin breakdown will have a volume reduction of 80% by week 12 Date Initiated: 09/15/2020 Target Resolution  Date: 01/15/2021 Goal Status: Active Ulcer/skin breakdown will heal within 14 weeks Date Initiated: 09/15/2020 Target Resolution Date: 02/15/2021 Goal Status: Active Interventions: Assess patient/caregiver ability to obtain necessary supplies Assess patient/caregiver ability to perform ulcer/skin care regimen upon admission and as needed Assess ulceration(s) every visit Notes: Electronic Signature(s) Signed: 10/13/2020 5:00:28 PM By: Donnamarie Poag Entered By: Donnamarie Poag on 10/13/2020 13:06:40 [name] ([id number]) -------------------------------------------------------------------------------- Pain Assessment Details Patient Name: [name]. Date of Service: 10/13/2020 12:30 PM Medical Record Number: [id number] Patient Account Number: [id number] Date of Birth/Sex: Nov 20, 1926 (87 y.o. F) Treating RN: Dolan Amen Primary Care Manpreet Kemmer: Sallee Lange Other Clinician: Referring Kaysen Sefcik: Sallee Lange Treating Jacklynn Dehaas/Extender: Skipper Cliche in Treatment: 4 Active Problems Location of Pain Severity and Description of Pain Patient Has Paino No Site Locations Rate the pain. Current Pain Level: 0 Pain Management and Medication Current Pain Management: Electronic Signature(s) Signed: 10/13/2020 5:15:51 PM By: Georges Mouse, Minus Breeding RN Entered By: Georges Mouse, Kenia on 10/13/2020 12:49:42 [name] ([id number]) -------------------------------------------------------------------------------- Patient/Caregiver Education Details Patient Name: [name]. Date of Service: 10/13/2020 12:30 PM Medical Record Number: [id number] Patient Account Number: [id number] Date of Birth/Gender: March 16, 1927 (87 y.o. F) Treating RN: Donnamarie Poag Primary Care Physician: Sallee Lange Other Clinician: Referring Physician: Sallee Lange Treating Physician/Extender: Skipper Cliche in Treatment: 4 Education Assessment Education Provided To: Patient and Caregiver Education Topics Provided Wound/Skin Impairment: Electronic Signature(s) Signed: 10/13/2020 5:00:28 PM By: Donnamarie Poag Entered By: Donnamarie Poag on 10/13/2020 13:08:10 [name] ([id number]) -------------------------------------------------------------------------------- Wound Assessment Details Patient Name: [name]. Date of Service: 10/13/2020 12:30 PM Medical Record Number: [id number] Patient Account Number: [id number] Date of Birth/Sex: 02/25/1927 (87 y.o. F) Treating RN: Dolan Amen Primary Care Strother Everitt: Sallee Lange Other Clinician: Referring Nyssa Sayegh: Sallee Lange Treating Robel Wuertz/Extender: Jeri Cos Weeks in Treatment: 4 Wound Status Wound Number: 2 Primary Venous Leg Ulcer Etiology: Wound Location: Right, Medial, Anterior Lower Leg Secondary Lymphedema Wounding Event: Blister Etiology: Date Acquired: 07/08/2020 Wound Open Weeks Of Treatment: 4 Status: Clustered Wound:  No Comorbid Cataracts, Anemia, Lymphedema, Arrhythmia, Congestive History: Heart Failure, Coronary Artery Disease, Hypertension, Type II Diabetes, Gout, Osteoarthritis Photos Wound Measurements Length: (cm) 2.9 Width: (cm) 1.3 Depth: (cm) 0.1 Area: (cm) 2.961 Volume: (cm) 0.296 % Reduction in Area: 93.7% % Reduction in Volume: 93.7% Epithelialization: Small (1-33%) Tunneling: No Undermining: No Wound Description Classification: Full Thickness Without Exposed Support Structu Exudate Amount: Medium Exudate Type: Serosanguineous Exudate Color: red, brown res Foul Odor After Cleansing: No Slough/Fibrino Yes Wound Bed Granulation Amount: Medium (34-66%) Exposed Structure Granulation Quality: Pink Fascia Exposed: No Necrotic Amount: Medium (34-66%) Fat Layer (Subcutaneous Tissue) Exposed: Yes Necrotic Quality: Adherent Slough Tendon Exposed: No Muscle Exposed: No Joint Exposed: No Bone Exposed: No Electronic Signature(s) Signed: 10/13/2020 5:15:51 PM By: Georges Mouse, Minus Breeding RN Entered By: Georges Mouse, Minus Breeding on 10/13/2020 12:57:26 [name] ([id number]) -------------------------------------------------------------------------------- Vitals Details Patient Name: [name]. Date of Service: 10/13/2020 12:30 PM Medical Record Number: [id number] Patient Account Number: [id number] Date of Birth/Sex: January 12, 1927 (87 y.o. F) Treating RN: Dolan Amen Primary Care Rosezella Kronick: Sallee Lange Other Clinician: Referring Demarquis Osley: Sallee Lange Treating Sanaia Jasso/Extender: Skipper Cliche in Treatment: 4 Vital Signs Time Taken: 12:48 Temperature (F): 99.3 Height (in): 62 Pulse (bpm): 73 Weight (lbs): 166 Respiratory Rate (breaths/min): 18 Body Mass Index (BMI): 30.4 Blood Pressure (mmHg): 156/71 Reference Range: 80 - 120 mg / dl Electronic Signature(s) Signed: 10/13/2020 5:15:51 PM By: Georges Mouse, Minus Breeding RN Entered By: Georges Mouse, Minus Breeding  on  10/13/2020 12:49:35

## 2020-10-15 ENCOUNTER — Telehealth: Payer: Self-pay

## 2020-10-15 NOTE — Telephone Encounter (Signed)
Please advise. Thank you

## 2020-10-15 NOTE — Telephone Encounter (Signed)
[name] wanted me to put in notes when they come in next Tuesday she wants to talk to Dr Nicki Reaper about putting her mom on some oxygen having some struggles breathing.

## 2020-10-15 NOTE — Telephone Encounter (Signed)
When the patient comes in next week lets make sure that we check oxygen at rest Also ambulate her for at least 50 feet and recheck oxygen

## 2020-10-16 ENCOUNTER — Ambulatory Visit (INDEPENDENT_AMBULATORY_CARE_PROVIDER_SITE_OTHER): Payer: Medicare HMO | Admitting: *Deleted

## 2020-10-16 DIAGNOSIS — L97822 Non-pressure chronic ulcer of other part of left lower leg with fat layer exposed: Secondary | ICD-10-CM | POA: Diagnosis not present

## 2020-10-16 DIAGNOSIS — I639 Cerebral infarction, unspecified: Secondary | ICD-10-CM

## 2020-10-16 DIAGNOSIS — Z5181 Encounter for therapeutic drug level monitoring: Secondary | ICD-10-CM | POA: Diagnosis not present

## 2020-10-16 DIAGNOSIS — I4891 Unspecified atrial fibrillation: Secondary | ICD-10-CM | POA: Diagnosis not present

## 2020-10-16 DIAGNOSIS — Z4800 Encounter for change or removal of nonsurgical wound dressing: Secondary | ICD-10-CM | POA: Diagnosis not present

## 2020-10-16 DIAGNOSIS — L97812 Non-pressure chronic ulcer of other part of right lower leg with fat layer exposed: Secondary | ICD-10-CM | POA: Diagnosis not present

## 2020-10-16 DIAGNOSIS — I130 Hypertensive heart and chronic kidney disease with heart failure and stage 1 through stage 4 chronic kidney disease, or unspecified chronic kidney disease: Secondary | ICD-10-CM | POA: Diagnosis not present

## 2020-10-16 DIAGNOSIS — I890 Lymphedema, not elsewhere classified: Secondary | ICD-10-CM | POA: Diagnosis not present

## 2020-10-16 DIAGNOSIS — I509 Heart failure, unspecified: Secondary | ICD-10-CM | POA: Diagnosis not present

## 2020-10-16 DIAGNOSIS — E11622 Type 2 diabetes mellitus with other skin ulcer: Secondary | ICD-10-CM | POA: Diagnosis not present

## 2020-10-16 DIAGNOSIS — E1151 Type 2 diabetes mellitus with diabetic peripheral angiopathy without gangrene: Secondary | ICD-10-CM | POA: Diagnosis not present

## 2020-10-16 DIAGNOSIS — I872 Venous insufficiency (chronic) (peripheral): Secondary | ICD-10-CM | POA: Diagnosis not present

## 2020-10-16 LAB — POCT INR: INR: 1.9 — AB (ref 2.0–3.0)

## 2020-10-16 NOTE — Telephone Encounter (Signed)
Added oxygen testing to note on schedule to do when she comes in on 5/17

## 2020-10-16 NOTE — Patient Instructions (Signed)
Take warfarin 1 tablet tonight then resume 1/2 tablet daily Recheck in 4 weeks 

## 2020-10-20 ENCOUNTER — Other Ambulatory Visit: Payer: Self-pay

## 2020-10-20 ENCOUNTER — Encounter: Payer: Medicare HMO | Admitting: Physician Assistant

## 2020-10-20 DIAGNOSIS — I872 Venous insufficiency (chronic) (peripheral): Secondary | ICD-10-CM | POA: Diagnosis not present

## 2020-10-20 DIAGNOSIS — I890 Lymphedema, not elsewhere classified: Secondary | ICD-10-CM | POA: Diagnosis not present

## 2020-10-20 DIAGNOSIS — L97811 Non-pressure chronic ulcer of other part of right lower leg limited to breakdown of skin: Secondary | ICD-10-CM | POA: Diagnosis not present

## 2020-10-20 DIAGNOSIS — E1122 Type 2 diabetes mellitus with diabetic chronic kidney disease: Secondary | ICD-10-CM | POA: Diagnosis not present

## 2020-10-20 DIAGNOSIS — M109 Gout, unspecified: Secondary | ICD-10-CM | POA: Diagnosis not present

## 2020-10-20 DIAGNOSIS — E11622 Type 2 diabetes mellitus with other skin ulcer: Secondary | ICD-10-CM | POA: Diagnosis not present

## 2020-10-20 DIAGNOSIS — I480 Paroxysmal atrial fibrillation: Secondary | ICD-10-CM | POA: Diagnosis not present

## 2020-10-20 DIAGNOSIS — L97821 Non-pressure chronic ulcer of other part of left lower leg limited to breakdown of skin: Secondary | ICD-10-CM | POA: Diagnosis not present

## 2020-10-20 DIAGNOSIS — M1990 Unspecified osteoarthritis, unspecified site: Secondary | ICD-10-CM | POA: Diagnosis not present

## 2020-10-20 NOTE — Progress Notes (Addendum)
[name], [name] ([id number]) Visit Report for 10/20/2020 Chief Complaint Document Details Patient Name: [name], [name]. Date of Service: 10/20/2020 12:45 PM Medical Record Number: [id number] Patient Account Number: [id number] Date of Birth/Sex: 1926/12/23 (87 y.o. F) Treating RN: Donnamarie Poag Primary Care Provider: Sallee Lange Other Clinician: Referring Provider: Sallee Lange Treating Provider/Extender: Skipper Cliche in Treatment: 5 Information Obtained from: Patient Chief Complaint Bilateral LE Ulcers Electronic Signature(s) Signed: 10/20/2020 1:17:11 PM By: Worthy Keeler PA-C Entered By: Worthy Keeler on 10/20/2020 13:17:10 [name] ([id number]) -------------------------------------------------------------------------------- HPI Details Patient Name: [name]. Date of Service: 10/20/2020 12:45 PM Medical Record Number: [id number] Patient Account Number: [id number] Date of Birth/Sex: December 21, 1926 (87 y.o. F) Treating RN: Donnamarie Poag Primary Care Provider: Sallee Lange Other Clinician: Referring Provider: Sallee Lange Treating Provider/Extender: Skipper Cliche in Treatment: 5 History of Present Illness HPI Description: 09/15/20 upon evaluation today patient presents for initial inspection here in our clinic concerning issues she has been having with blisters on the lower extremities bilaterally. She tells me that this began around the beginning of February. With that being said she does have a longstanding history of lymphedema she tells me. She also has chronic kidney disease stage IV which is causing some swelling as well her nephrologist did place her recently on Lasix which has helped to some degree. Her wounds actually appear to be healing which is great news. The patient does have a history of chronic venous insufficiency, lymphedema, diabetes mellitus type 2 which is controlled with diet she is not on any medications her last hemoglobin A1c  was 5.6. She also has atrial fibrillation, cardiac pacemaker, and again stage IV kidney disease. 09/22/2020 upon evaluation today patient appears to be doing well with regard to her wounds. She has been tolerating the dressing changes without complication. With that being said even in the past week she has made a dramatic improvement even though apparently the home health nurse actually placed her in an Unna boot wrap and not an actual 3 layer compression wrap. I suspect this was due to the fact that the patient did not have the medications needed to appropriately apply the dressings at home. This is fine but the nurse did not contact us to let me know. 4/28; the patient's left leg wounds on the medial aspect are healed on the right the wound that we had is measuring smaller but our nurse reports a new area in the original blistered area. We have been using alginate 10/13/2020 on evaluation today patient's wound is actually showing signs of good improvement. Fortunately there is no evidence of active infection at this time which is great news and overall very pleased with where things stand. No fevers, chills, nausea, vomiting, or diarrhea. 10/20/2020 upon evaluation today patient is actually showing signs of good improvement in regard to her right lower leg. She has been tolerating the dressing changes without complication. Fortunately there is no evidence of infection at this time which is great and overall I am extremely pleased with where she stands. Electronic Signature(s) Signed: 10/20/2020 2:23:54 PM By: Worthy Keeler PA-C Entered By: Worthy Keeler on 10/20/2020 14:23:53 [name] ([id number]) -------------------------------------------------------------------------------- Physical Exam Details Patient Name: [name]. Date of Service: 10/20/2020 12:45 PM Medical Record Number: [id number] Patient Account Number: [id number] Date of Birth/Sex: 1926-07-19 (87 y.o. F) Treating  RN: Donnamarie Poag Primary Care Provider: Sallee Lange Other Clinician: Referring Provider: Sallee Lange Treating Provider/Extender: Skipper Cliche in Treatment: 5  Constitutional Well-nourished and well-hydrated in no acute distress. Respiratory normal breathing without difficulty. Psychiatric this patient is able to make decisions and demonstrates good insight into disease process. Alert and Oriented x 3. pleasant and cooperative. Notes Upon inspection patient's wound did not require any sharp debridement today which is great news I feel like she is doing quite well. She does have home health for that reason I Georgina Peer recommend that we actually see her likely for a 2-week follow-up and we will see how things are doing. Electronic Signature(s) Signed: 10/20/2020 2:24:11 PM By: Worthy Keeler PA-C Entered By: Worthy Keeler on 10/20/2020 14:24:11 [name] ([id number]) -------------------------------------------------------------------------------- Physician Orders Details Patient Name: [name]. Date of Service: 10/20/2020 12:45 PM Medical Record Number: [id number] Patient Account Number: [id number] Date of Birth/Sex: Aug 12, 1926 (87 y.o. 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 [facility] for wound care. May utilize formulary equivalent dressing for wound treatment orders unless otherwise specified. Home Health Nurse may visit [facility] to address patientos wound care needs. - [facility] o Scheduled days for dressing changes to be completed; exception, patient has scheduled wound care visit that day. - 2 x a week- Monday/Thursday or Tuesday/Friday o **Please direct any NON-WOUND related issues/requests for orders to patient's Primary Care Physician. **If current dressing causes regression in wound condition, may D/C ordered dressing product/s and apply Normal Saline Moist Dressing daily until next [place] or Other MD appointment. **Notify [facility] of regression in wound condition at [phone]. Bathing/ Shower/ Hygiene o May shower with wound dressing protected with water repellent cover or cast protector. Edema Control - Lymphedema / Segmental Compressive Device / Other Right Lower Extremity o Optional: One layer of unna paste to top of compression wrap (to act as an anchor). o 3 Layer Compression System for Lymphedema. o Patient to wear own Velcro compression garment. Remove compression stockings every night before going to bed and put on every morning when getting up. - Left extremity-Farrow wrap o Elevate, Exercise Daily and Avoid Standing for Long Periods of Time. o Elevate legs to the level of the heart and pump ankles as often as possible o Elevate leg(s) parallel to the floor when sitting. Wound Treatment Wound #2 - Lower Leg Wound Laterality: Right, Medial, Anterior Cleanser: Normal Saline (Generic) 2 x Per Week/30 Days Discharge Instructions: Wash your hands with soap and water. Remove old dressing, discard into plastic bag and place into trash. Cleanse the wound with Normal Saline prior to applying a clean dressing using gauze sponges, not  tissues or cotton balls. Do not scrub or use excessive force. Pat dry using gauze sponges, not tissue or cotton balls. Primary Dressing: Hydrofera Blue Ready Transfer Foam, 2.5x2.5 (in/in) 2 x Per Week/30 Days Discharge Instructions: Apply Hydrofera Blue Ready to wound bed as directed Secondary Dressing: ABD Pad 5x9 (in/in) (Generic) 2 x  Per Week/30 Days Discharge Instructions: Cover with ABD pad Compression Wrap: Profore Lite LF 3 Multilayer Compression Bandaging System (Generic) 2 x Per Week/30 Days Discharge Instructions: Apply 3 multi-layer wrap as prescribed. NAHLA, [name] ([id number]) Electronic Signature(s) Signed: 10/20/2020 5:15:41 PM By: Worthy Keeler PA-C Signed: 10/21/2020 2:35:55 PM By: Donnamarie Poag Entered By: Donnamarie Poag on 10/20/2020 13:40:58 [name], [name] ([id number]) -------------------------------------------------------------------------------- Problem List Details Patient Name: [name], [name]. Date of Service: 10/20/2020 12:45 PM Medical Record Number: [id number] Patient Account Number: [id number] Date of Birth/Sex: 25-Feb-1927 (87 y.o. F) Treating RN: Donnamarie Poag Primary Care Provider: Sallee Lange Other Clinician: Referring Provider: Sallee Lange Treating Provider/Extender: Skipper Cliche in Treatment: 5 Active Problems ICD-10 Encounter Code Description Active Date MDM Diagnosis I89.0 Lymphedema, not elsewhere classified 09/15/2020 No Yes I87.2 Venous insufficiency (chronic) (peripheral) 09/15/2020 No Yes L97.821 Non-pressure chronic ulcer of other part of left lower leg limited to 09/15/2020 No Yes breakdown of skin L97.811 Non-pressure chronic ulcer of other part of right lower leg limited to 09/15/2020 No Yes breakdown of skin E11.622 Type 2 diabetes mellitus with other skin ulcer 09/15/2020 No Yes I48.0 Paroxysmal atrial fibrillation 09/15/2020 No Yes Z95.0 Presence of cardiac pacemaker 09/15/2020 No Yes N18.4 Chronic kidney disease, stage 4  (severe) 09/15/2020 No Yes Inactive Problems Resolved Problems Electronic Signature(s) Signed: 10/20/2020 1:17:03 PM By: Worthy Keeler PA-C Entered By: Worthy Keeler on 10/20/2020 13:17:02 [name] ([id number]) -------------------------------------------------------------------------------- Progress Note Details Patient Name: [name]. Date of Service: 10/20/2020 12:45 PM Medical Record Number: [id number] Patient Account Number: [id number] Date of Birth/Sex: 1927-03-03 (87 y.o. F) Treating RN: Donnamarie Poag Primary Care Provider: Sallee Lange Other Clinician: Referring Provider: Sallee Lange Treating Provider/Extender: Skipper Cliche in Treatment: 5 Subjective Chief Complaint Information obtained from Patient Bilateral LE Ulcers History of Present Illness (HPI) 09/15/20 upon evaluation today patient presents for initial inspection here in our clinic concerning issues she has been having with blisters on the lower extremities bilaterally. She tells me that this began around the beginning of February. With that being said she does have a longstanding history of lymphedema she tells me. She also has chronic kidney disease stage IV which is causing some swelling as well her nephrologist did place her recently on Lasix which has helped to some degree. Her wounds actually appear to be healing which is great news. The patient does have a history of chronic venous insufficiency, lymphedema, diabetes mellitus type 2 which is controlled with diet she is not on any medications her last hemoglobin A1c was 5.6. She also has atrial fibrillation, cardiac pacemaker, and again stage IV kidney disease. 09/22/2020 upon evaluation today patient appears to be doing well with regard to her wounds. She has been tolerating the dressing changes without complication. With that being said even in the past week she has made a dramatic improvement even though apparently the home health nurse  actually placed her in an Unna boot wrap and not an actual 3 layer compression wrap. I suspect this was due to the fact that the patient did not have the medications needed to appropriately apply the dressings at home. This is fine but the nurse did not contact us to let me know. 4/28; the patient's left leg wounds on the medial aspect are healed on the right the wound that we had is measuring smaller but our nurse reports a new area in the original blistered area. We have been using alginate 10/13/2020 on evaluation today patient's wound  is actually showing signs of good improvement. Fortunately there is no evidence of active infection at this time which is great news and overall very pleased with where things stand. No fevers, chills, nausea, vomiting, or diarrhea. 10/20/2020 upon evaluation today patient is actually showing signs of good improvement in regard to her right lower leg. She has been tolerating the dressing changes without complication. Fortunately there is no evidence of infection at this time which is great and overall I am extremely pleased with where she stands. Objective Constitutional Well-nourished and well-hydrated in no acute distress. Vitals Time Taken: 12:58 PM, Height: 62 in, Weight: 166 lbs, BMI: 30.4, Temperature: 98.7 F, Pulse: 70 bpm, Respiratory Rate: 16 breaths/min, Blood Pressure: 169/65 mmHg. Respiratory normal breathing without difficulty. Psychiatric this patient is able to make decisions and demonstrates good insight into disease process. Alert and Oriented x 3. pleasant and cooperative. General Notes: Upon inspection patient's wound did not require any sharp debridement today which is great news I feel like she is doing quite well. She does have home health for that reason I Georgina Peer recommend that we actually see her likely for a 2-week follow-up and we will see how things are doing. Integumentary (Hair, Skin) Wound #2 status is Open. Original cause of wound  was Blister. The date acquired was: 07/08/2020. The wound has been in treatment 5 weeks. The wound is located on the Right,Medial,Anterior Lower Leg. The wound measures 2.5cm length x 1.3cm width x 0.1cm depth; 2.553cm^2 area and 0.255cm^3 volume. There is Fat Layer (Subcutaneous Tissue) exposed. There is no tunneling or undermining noted. There is a medium amount of serosanguineous drainage noted. There is medium (34-66%) pink, pale granulation within the wound bed. There is a medium (34-66%) amount of necrotic tissue within the wound bed including Adherent Slough. [name], [name] ([id number]) Assessment Active Problems ICD-10 Lymphedema, not elsewhere classified Venous insufficiency (chronic) (peripheral) Non-pressure chronic ulcer of other part of left lower leg limited to breakdown of skin Non-pressure chronic ulcer of other part of right lower leg limited to breakdown of skin Type 2 diabetes mellitus with other skin ulcer Paroxysmal atrial fibrillation Presence of cardiac pacemaker Chronic kidney disease, stage 4 (severe) Procedures Wound #2 Pre-procedure diagnosis of Wound #2 is a Venous Leg Ulcer located on the Right,Medial,Anterior Lower Leg . There was a Three Layer Compression Therapy Procedure by Donnamarie Poag, RN. Post procedure Diagnosis Wound #2: Same as Pre-Procedure Plan Follow-up Appointments: Return Appointment in 2 weeks. Home Health: [facility] for wound care. May utilize formulary equivalent dressing for wound treatment orders unless otherwise specified. Home Health Nurse may visit [facility]N to address patient s wound care needs. - [facility] Scheduled days for dressing changes to be completed; exception, patient has scheduled wound care visit that day. - 2 x a week- Monday/Thursday or Tuesday/Friday **Please direct any NON-WOUND related issues/requests for orders to patient's Primary Care Physician. **If current dressing causes regression in wound condition, may  D/C ordered dressing product/s and apply Normal Saline Moist Dressing daily until next [place] or Other MD appointment. **Notify [facility] of regression in wound condition at [phone]. Bathing/ Shower/ Hygiene: May shower with wound dressing protected with water repellent cover or cast protector. Edema Control - Lymphedema / Segmental Compressive Device / Other: Optional: One layer of unna paste to top of compression wrap (to act as an anchor). 3 Layer Compression System for Lymphedema. Patient to wear own Velcro compression garment. Remove compression stockings every night before going  to bed and put on every morning when getting up. - Left extremity-Farrow wrap Elevate, Exercise Daily and Avoid Standing for Long Periods of Time. Elevate legs to the level of the heart and pump ankles as often as possible Elevate leg(s) parallel to the floor when sitting. WOUND #2: - Lower Leg Wound Laterality: Right, Medial, Anterior Cleanser: Normal Saline (Generic) 2 x Per Week/30 Days Discharge Instructions: Wash your hands with soap and water. Remove old dressing, discard into plastic bag and place into trash. Cleanse the wound with Normal Saline prior to applying a clean dressing using gauze sponges, not tissues or cotton balls. Do not scrub or use excessive force. Pat dry using gauze sponges, not tissue or cotton balls. Primary Dressing: Hydrofera Blue Ready Transfer Foam, 2.5x2.5 (in/in) 2 x Per Week/30 Days Discharge Instructions: Apply Hydrofera Blue Ready to wound bed as directed Secondary Dressing: ABD Pad 5x9 (in/in) (Generic) 2 x Per Week/30 Days Discharge Instructions: Cover with ABD pad Compression Wrap: Profore Lite LF 3 Multilayer Compression Bandaging System (Generic) 2 x Per Week/30 Days Discharge Instructions: Apply 3 multi-layer wrap as prescribed. 1. I would recommend at this point we have the patient going to continue with the wound care measures as before  and that she is in agreement with the plan this includes the use of the [facility] dressing which I think has been beneficial. 2. I am also can recommend that we continue with the ABD pad to cover followed by a 3 layer compression wrap that seems to be doing great from the standpoint of helping the wound to improve in general. [name], [name]. ([id number]) We will see patient back for reevaluation in 2 weeks here in the clinic. If anything worsens or changes patient will contact our office for additional recommendations. Electronic Signature(s) Signed: 10/20/2020 2:24:55 PM By: Worthy Keeler PA-C Entered By: Worthy Keeler on 10/20/2020 14:24:54 [name] ([id number]) -------------------------------------------------------------------------------- SuperBill Details Patient Name: [name]. Date of Service: 10/20/2020 Medical Record Number: [id number] Patient Account Number: [id number] Date of Birth/Sex: 1927-05-07 (87 y.o. F) Treating RN: Donnamarie Poag Primary Care Provider: Sallee Lange Other Clinician: Referring Provider: Sallee Lange Treating Provider/Extender: Skipper Cliche in Treatment: 5 Diagnosis Coding ICD-10 Codes Code Description I89.0 Lymphedema, not elsewhere classified I87.2 Venous insufficiency (chronic) (peripheral) L97.821 Non-pressure chronic ulcer of other part of left lower leg limited to breakdown of skin L97.811 Non-pressure chronic ulcer of other part of right lower leg limited to breakdown of skin E11.622 Type 2 diabetes mellitus with other skin ulcer I48.0 Paroxysmal atrial fibrillation Z95.0 Presence of cardiac pacemaker N18.4 Chronic kidney disease, stage 4 (severe) Facility Procedures CPT4 Code: [id number] Description: (Facility Use Only) [phone] - [place] RT LEG Modifier: Quantity: 1 Physician Procedures CPT4 [id number] Description: [id number] - WC PHYS LEVEL 3 - EST PT Modifier: Quantity: 1 CPT4  Code: Description: ICD-10 Diagnosis Description I89.0 Lymphedema, not elsewhere classified I87.2 Venous insufficiency (chronic) (peripheral) L97.821 Non-pressure chronic ulcer of other part of left lower leg limited to bre L97.811 Non-pressure chronic ulcer of  other part of right lower leg limited to br Modifier: akdown of skin eakdown of skin Quantity: Electronic Signature(s) Signed: 10/20/2020 2:25:09 PM By: Worthy Keeler PA-C Entered By: Worthy Keeler on 10/20/2020 14:25:08

## 2020-10-21 ENCOUNTER — Encounter: Payer: Self-pay | Admitting: Family Medicine

## 2020-10-21 ENCOUNTER — Ambulatory Visit (INDEPENDENT_AMBULATORY_CARE_PROVIDER_SITE_OTHER): Payer: Medicare HMO | Admitting: Family Medicine

## 2020-10-21 ENCOUNTER — Other Ambulatory Visit: Payer: Self-pay | Admitting: Family Medicine

## 2020-10-21 VITALS — BP 134/74 | HR 74 | Temp 97.5°F | Ht 63.0 in | Wt 163.0 lb

## 2020-10-21 DIAGNOSIS — R0600 Dyspnea, unspecified: Secondary | ICD-10-CM | POA: Diagnosis not present

## 2020-10-21 DIAGNOSIS — Z7901 Long term (current) use of anticoagulants: Secondary | ICD-10-CM

## 2020-10-21 DIAGNOSIS — R600 Localized edema: Secondary | ICD-10-CM

## 2020-10-21 DIAGNOSIS — R0609 Other forms of dyspnea: Secondary | ICD-10-CM

## 2020-10-21 DIAGNOSIS — I700 Atherosclerosis of aorta: Secondary | ICD-10-CM

## 2020-10-21 DIAGNOSIS — N184 Chronic kidney disease, stage 4 (severe): Secondary | ICD-10-CM | POA: Diagnosis not present

## 2020-10-21 NOTE — Progress Notes (Signed)
   Subjective:    Patient ID: [name], female    DOB: 11-07-1926, 87 y.o.   MRN: [id number]  HPI  Daily sob with activity - worse when laying down - frequent use of rescue inhaler x 2 months worsened ortness of breath -  Patient has had chronic swelling in the legs has stage IV renal disease Patient does have some history of diabetes being controlled by diet In addition to this sees kidney doctor on a regular basis Review of Systems     Objective:   Physical Exam Lungs clear heart regular pedal edema noted No crackles in the lungs      Assessment & Plan:   1. DOE (dyspnea on exertion) Concerning for the possibility of developing CHF.  Will need up-to-date echo in addition to this lab work.  Continue the diuretic.  Await the results. - Basic Metabolic Panel (BMET) - B Nat Peptide - ECHOCARDIOGRAM COMPLETE  2. Pedal edema Not as bad as what it once was continue the diuretic. - Basic Metabolic Panel (BMET) - B Nat Peptide - ECHOCARDIOGRAM COMPLETE  3. Chronic kidney disease, stage IV (severe) (HCC) Check kidney function.  Avoid excessive salt.  Continue current medications.  4. Aortic atherosclerosis (HCC) Continue cholesterol medicine this was seen on a CAT scan.

## 2020-10-21 NOTE — Progress Notes (Signed)
SHEKELIA, [name] ([id number]) Visit Report for 10/20/2020 Arrival Information Details Patient Name: [name], [name]. Date of Service: 10/20/2020 12:45 PM Medical Record Number: [id number] Patient Account Number: [id number] Date of Birth/Sex: Dec 18, 1926 (87 y.o. F) Treating RN: Donnamarie Poag Primary Care Becci Batty: Sallee Lange Other Clinician: Referring Kevona Lupinacci: Sallee Lange Treating Jerney Baksh/Extender: Skipper Cliche in Treatment: 5 Visit Information History Since Last Visit Added or deleted any medications: No Patient Arrived: Wheel Chair Had a fall or experienced change in No Arrival Time: 12:57 activities of daily living that may affect Accompanied By: daughter risk of falls: Transfer Assistance: None Hospitalized since last visit: No Patient Identification Verified: Yes Pain Present Now: No Secondary Verification Process Yes Completed: Patient Has Alerts: Yes Patient Alerts: Patient on Blood Thinner on COUMADIN DIABETIC NON COMPRESSIBLE BIL LEGS Electronic Signature(s) Signed: 10/20/2020 5:31:07 PM By: Jeanine Luz Entered By: Jeanine Luz on 10/20/2020 12:59:52 [name] ([id number]) -------------------------------------------------------------------------------- Clinic Level of Care Assessment Details Patient Name: [name]. Date of Service: 10/20/2020 12:45 PM Medical Record Number: [id number] Patient Account Number: [id number] Date of Birth/Sex: 08-28-1926 (87 y.o. F) Treating RN: Donnamarie Poag Primary Care Alexis Mizuno: Sallee Lange Other Clinician: Referring Donnie Panik: Sallee Lange Treating Solita Macadam/Extender: Skipper Cliche in Treatment: 5 Clinic Level of Care Assessment Items TOOL 1 Quantity Score _0  - Use when EandM and Procedure is performed on INITIAL visit 0 ASSESSMENTS - Nursing Assessment / Reassessment _1  - General Physical Exam (combine w/ comprehensive assessment (listed just below) when performed on new 0 pt. evals) _2   - 0 Comprehensive Assessment (HX, ROS, Risk Assessments, Wounds Hx, etc.) ASSESSMENTS - Wound and Skin Assessment / Reassessment _3  - Dermatologic / Skin Assessment (not related to wound area) 0 ASSESSMENTS - Ostomy and/or Continence Assessment and Care _4  - Incontinence Assessment and Management 0 _5  - 0 Ostomy Care Assessment and Management (repouching, etc.) PROCESS - Coordination of Care _6  - Simple Patient / Family Education for ongoing care 0 _7  - 0 Complex (extensive) Patient / Family Education for ongoing care _8  - 0 Staff obtains Programmer, systems, Records, Test Results / Process Orders _9  - 0 Staff telephones HHA, Nursing Homes / Clarify orders / etc _10  - 0 Routine Transfer to another Facility (non-emergent condition) _11  - 0 Routine Hospital Admission (non-emergent condition) _12  - 0 New Admissions / Biomedical engineer / Ordering NPWT, Apligraf, etc. _13  - 0 Emergency Hospital Admission (emergent condition) PROCESS - Special Needs _14  - Pediatric / Minor Patient Management 0 _15  - 0 Isolation Patient Management _16  - 0 Hearing / Language / Visual special needs _17  - 0 Assessment of Community assistance (transportation, D/C planning, etc.) _18  - 0 Additional assistance / Altered mentation _19  - 0 Support Surface(s) Assessment (bed, cushion, seat, etc.) INTERVENTIONS - Miscellaneous _20  - External ear exam 0 _21  - 0 Patient Transfer (multiple staff / Civil Service fast streamer / Similar devices) _22  - 0 Simple Staple / Suture removal (25 or less) _23  - 0 Complex Staple / Suture removal (26 or more) _24  - 0 Hypo/Hyperglycemic Management (do not check if billed separately) _25  - 0 Ankle / Brachial Index (ABI) - do not check if billed separately Has the patient been seen at the hospital within the last three years: Yes Total Score: 0 Level Of Care: ____ [name] ([id number]) Electronic Signature(s) Signed: 10/21/2020 2:35:55 PM By: Donnamarie Poag Entered By: Donnamarie Poag on 10/20/2020  13:41:05 [name] ([id number]) -------------------------------------------------------------------------------- Compression Therapy Details Patient Name: [name]. Date of Service: 10/20/2020 12:45 PM  Medical Record Number: [id number] Patient Account Number: [id number] Date of Birth/Sex: 05-10-1927 (87 y.o. F) Treating RN: Donnamarie Poag Primary Care Vidal Lampkins: Sallee Lange Other Clinician: Referring Nasif Bos: Sallee Lange Treating Conny Moening/Extender: Skipper Cliche in Treatment: 5 Compression Therapy Performed for Wound Assessment: Wound #2 Right,Medial,Anterior Lower Leg Performed By: Clinician Donnamarie Poag, RN Compression Type: Three Layer Post Procedure Diagnosis Same as Pre-procedure Electronic Signature(s) Signed: 10/21/2020 2:35:55 PM By: Donnamarie Poag Entered By: Donnamarie Poag on 10/20/2020 13:40:09 [name] ([id number]) -------------------------------------------------------------------------------- Encounter Discharge Information Details Patient Name: [name]. Date of Service: 10/20/2020 12:45 PM Medical Record Number: [id number] Patient Account Number: [id number] Date of Birth/Sex: 1926-09-09 (87 y.o. F) Treating RN: Donnamarie Poag Primary Care Shameer Molstad: Sallee Lange Other Clinician: Referring Harbor Paster: Sallee Lange Treating Dimitri Shakespeare/Extender: Skipper Cliche in Treatment: 5 Encounter Discharge Information Items Discharge Condition: Stable Ambulatory Status: Wheelchair Discharge Destination: Home Transportation: Private Auto Accompanied By: family Schedule Follow-up Appointment: Yes Clinical Summary of Care: Electronic Signature(s) Signed: 10/21/2020 2:35:55 PM By: Donnamarie Poag Entered By: Donnamarie Poag on 10/20/2020 13:54:39 [name] ([id number]) -------------------------------------------------------------------------------- Lower Extremity Assessment Details Patient Name: [name]. Date of Service: 10/20/2020  12:45 PM Medical Record Number: [id number] Patient Account Number: [id number] Date of Birth/Sex: Aug 05, 1926 (87 y.o. F) Treating RN: Donnamarie Poag Primary Care Lakesa Coste: Sallee Lange Other Clinician: Referring Sosaia Pittinger: Sallee Lange Treating Bindu Docter/Extender: Jeri Cos Weeks in Treatment: 5 Edema Assessment Assessed: [Left: No] [Right: No] [Left: Edema] [Right: :] Calf Left: Right: Point of Measurement: 31 cm From Medial Instep 33.6 cm Ankle Left: Right: Point of Measurement: 9 cm From Medial Instep 18.5 cm Electronic Signature(s) Signed: 10/20/2020 5:31:07 PM By: Jeanine Luz Signed: 10/21/2020 2:35:55 PM By: Donnamarie Poag Entered By: Jeanine Luz on 10/20/2020 13:15:48 [name] ([id number]) -------------------------------------------------------------------------------- Multi Wound Chart Details Patient Name: [name]. Date of Service: 10/20/2020 12:45 PM Medical Record Number: [id number] Patient Account Number: [id number] Date of Birth/Sex: 1927-03-10 (87 y.o. F) Treating RN: Donnamarie Poag Primary Care Kathreen Dileo: Sallee Lange Other Clinician: Referring Alano Blasco: Sallee Lange Treating Rhonin Trott/Extender: Skipper Cliche in Treatment: 5 Vital Signs Height(in): 18 Pulse(bpm): 1 Weight(lbs): 166 Blood Pressure(mmHg): 169/65 Body Mass Index(BMI): 30 Temperature(F): 98.7 Respiratory Rate(breaths/min): 16 Photos: [N/A:N/A] Wound Location: Right, Medial, Anterior Lower Leg N/A N/A Wounding Event: Blister N/A N/A Primary Etiology: Venous Leg Ulcer N/A N/A Secondary Etiology: Lymphedema N/A N/A Comorbid History: Cataracts, Anemia, Lymphedema, N/A N/A Arrhythmia, Congestive Heart Failure, Coronary Artery Disease, Hypertension, Type II Diabetes, Gout, Osteoarthritis Date Acquired: 07/08/2020 N/A N/A Weeks of Treatment: 5 N/A N/A Wound Status: Open N/A N/A Measurements L x W x D (cm) 2.5x1.3x0.1 N/A N/A Area (cm) : 2.553 N/A N/A Volume (cm) : 0.255  N/A N/A % Reduction in Area: 94.50% N/A N/A % Reduction in Volume: 94.50% N/A N/A Classification: Full Thickness Without Exposed N/A N/A Support Structures Exudate Amount: Medium N/A N/A Exudate Type: Serosanguineous N/A N/A Exudate Color: red, brown N/A N/A Granulation Amount: Medium (34-66%) N/A N/A Granulation Quality: Pink, Pale N/A N/A Necrotic Amount: Medium (34-66%) N/A N/A Exposed Structures: Fat Layer (Subcutaneous Tissue): N/A N/A Yes Fascia: No Tendon: No Muscle: No Joint: No Bone: No Epithelialization: Small (1-33%) N/A N/A Treatment Notes Electronic Signature(s) Signed: 10/21/2020 2:35:55 PM By: Donnamarie Poag Entered By: Donnamarie Poag on 10/20/2020 13:39:53 [name] ([id number] Ovens, Denese Killings ([id number]) -------------------------------------------------------------------------------- [place] Details Patient Name: [name]. Date of Service: 10/20/2020 12:45 PM Medical Record Number: [id number] Patient Account Number: [id number] Date of Birth/Sex:  May 30, 1927 (87 y.o. F) Treating RN: Donnamarie Poag Primary Care Naarah Borgerding: Sallee Lange Other Clinician: Referring Ashlea Dusing: Sallee Lange Treating Chayne Baumgart/Extender: Skipper Cliche in Treatment: 5 Active Inactive Wound/Skin Impairment Nursing Diagnoses: Knowledge deficit related to ulceration/compromised skin integrity Goals: Patient/caregiver will verbalize understanding of skin care regimen Date Initiated: 09/15/2020 Date Inactivated: 10/13/2020 Target Resolution Date: 10/15/2020 Goal Status: Met Ulcer/skin breakdown will have a volume reduction of 30% by week 4 Date Initiated: 09/15/2020 Target Resolution Date: 11/15/2020 Goal Status: Active Ulcer/skin breakdown will have a volume reduction of 50% by week 8 Date Initiated: 09/15/2020 Target Resolution Date: 12/15/2020 Goal Status: Active Ulcer/skin breakdown will have a volume reduction of 80% by week 12 Date Initiated:  09/15/2020 Target Resolution Date: 01/15/2021 Goal Status: Active Ulcer/skin breakdown will heal within 14 weeks Date Initiated: 09/15/2020 Target Resolution Date: 02/15/2021 Goal Status: Active Interventions: Assess patient/caregiver ability to obtain necessary supplies Assess patient/caregiver ability to perform ulcer/skin care regimen upon admission and as needed Assess ulceration(s) every visit Notes: Electronic Signature(s) Signed: 10/21/2020 2:35:55 PM By: Donnamarie Poag Entered By: Donnamarie Poag on 10/20/2020 13:38:37 [name] ([id number]) -------------------------------------------------------------------------------- Pain Assessment Details Patient Name: [name]. Date of Service: 10/20/2020 12:45 PM Medical Record Number: [id number] Patient Account Number: [id number] Date of Birth/Sex: 08/20/1926 (87 y.o. F) Treating RN: Donnamarie Poag Primary Care Labron Bloodgood: Sallee Lange Other Clinician: Referring Averill Winters: Sallee Lange Treating Maureena Dabbs/Extender: Skipper Cliche in Treatment: 5 Active Problems Location of Pain Severity and Description of Pain Patient Has Paino No Site Locations Rate the pain. Current Pain Level: 0 Pain Management and Medication Current Pain Management: Electronic Signature(s) Signed: 10/20/2020 5:31:07 PM By: Jeanine Luz Signed: 10/21/2020 2:35:55 PM By: Donnamarie Poag Entered By: Jeanine Luz on 10/20/2020 13:02:09 [name] ([id number]) -------------------------------------------------------------------------------- Patient/Caregiver Education Details Patient Name: [name]. Date of Service: 10/20/2020 12:45 PM Medical Record Number: [id number] Patient Account Number: [id number] Date of Birth/Gender: 1926/07/08 (87 y.o. F) Treating RN: Donnamarie Poag Primary Care Physician: Sallee Lange Other Clinician: Referring Physician: Sallee Lange Treating Physician/Extender: Skipper Cliche in Treatment: 5 Education  Assessment Education Provided To: Patient Education Topics Provided Basic Hygiene: Wound/Skin Impairment: Electronic Signature(s) Signed: 10/21/2020 2:35:55 PM By: Donnamarie Poag Entered By: Donnamarie Poag on 10/20/2020 13:41:28 [name] ([id number]) -------------------------------------------------------------------------------- Wound Assessment Details Patient Name: [name]. Date of Service: 10/20/2020 12:45 PM Medical Record Number: [id number] Patient Account Number: [id number] Date of Birth/Sex: 1926/10/06 (87 y.o. F) Treating RN: Donnamarie Poag Primary Care Stewart Sasaki: Sallee Lange Other Clinician: Referring Annisha Baar: Sallee Lange Treating Tina Gruner/Extender: Skipper Cliche in Treatment: 5 Wound Status Wound Number: 2 Primary Venous Leg Ulcer Etiology: Wound Location: Right, Medial, Anterior Lower Leg Secondary Lymphedema Wounding Event: Blister Etiology: Date Acquired: 07/08/2020 Wound Open Weeks Of Treatment: 5 Status: Clustered Wound: No Comorbid Cataracts, Anemia, Lymphedema, Arrhythmia, Congestive History: Heart Failure, Coronary Artery Disease, Hypertension, Type II Diabetes, Gout, Osteoarthritis Photos Wound Measurements Length: (cm) 2.5 Width: (cm) 1.3 Depth: (cm) 0.1 Area: (cm) 2.553 Volume: (cm) 0.255 % Reduction in Area: 94.5% % Reduction in Volume: 94.5% Epithelialization: Small (1-33%) Tunneling: No Undermining: No Wound Description Classification: Full Thickness Without Exposed Support Structures Exudate Amount: Medium Exudate Type: Serosanguineous Exudate Color: red, brown Foul Odor After Cleansing: No Slough/Fibrino Yes Wound Bed Granulation Amount: Medium (34-66%) Exposed Structure Granulation Quality: Pink, Pale Fascia Exposed: No Necrotic Amount: Medium (34-66%) Fat Layer (Subcutaneous Tissue) Exposed: Yes Necrotic Quality: Adherent Slough Tendon Exposed: No Muscle Exposed: No Joint Exposed: No Bone Exposed:  No Treatment  Notes Wound #2 (Lower Leg) Wound Laterality: Right, Medial, Anterior Cleanser Normal Saline Discharge Instruction: Wash your hands with soap and water. Remove old dressing, discard into plastic bag and place into trash. Cleanse the wound with Normal Saline prior to applying a clean dressing using gauze sponges, not tissues or cotton balls. Do not TRYSTIN, HARGROVE. ([id number]) scrub or use excessive force. Pat dry using gauze sponges, not tissue or cotton balls. Peri-Wound Care Topical Primary Dressing Hydrofera Blue Ready Transfer Foam, 2.5x2.5 (in/in) Discharge Instruction: Apply Hydrofera Blue Ready to wound bed as directed Secondary Dressing ABD Pad 5x9 (in/in) Discharge Instruction: Cover with ABD pad Secured With Compression Wrap Profore Lite LF 3 Multilayer Compression [place] Discharge Instruction: Apply 3 multi-layer wrap as prescribed. Compression Stockings Add-Ons Electronic Signature(s) Signed: 10/20/2020 5:31:07 PM By: Jeanine Luz Signed: 10/21/2020 2:35:55 PM By: Donnamarie Poag Entered By: Jeanine Luz on 10/20/2020 13:13:38 [name] ([id number]) -------------------------------------------------------------------------------- Vitals Details Patient Name: [name]. Date of Service: 10/20/2020 12:45 PM Medical Record Number: [id number] Patient Account Number: [id number] Date of Birth/Sex: [birth year] (87 y.o. F) Treating RN: Donnamarie Poag Primary Care Mertie Haslem: Sallee Lange Other Clinician: Referring Jennae Hakeem: Sallee Lange Treating Caylor Tallarico/Extender: Skipper Cliche in Treatment: 5 Vital Signs Time Taken: 12:58 Temperature (F): 98.7 Height (in): 62 Pulse (bpm): 70 Weight (lbs): 166 Respiratory Rate (breaths/min): 16 Body Mass Index (BMI): 30.4 Blood Pressure (mmHg): 169/65 Reference Range: 80 - 120 mg / dl Electronic Signature(s) Signed: 10/20/2020 5:31:07 PM By: Jeanine Luz Entered By: Jeanine Luz on  10/20/2020 13:02:02

## 2020-10-22 LAB — BASIC METABOLIC PANEL
BUN/Creatinine Ratio: 18 (ref 12–28)
BUN: 52 mg/dL — ABNORMAL HIGH (ref 10–36)
CO2: 20 mmol/L (ref 20–29)
Calcium: 10.3 mg/dL (ref 8.7–10.3)
Chloride: 98 mmol/L (ref 96–106)
Creatinine, Ser: 2.85 mg/dL — ABNORMAL HIGH (ref 0.57–1.00)
Glucose: 104 mg/dL — ABNORMAL HIGH (ref 65–99)
Potassium: 5.7 mmol/L — ABNORMAL HIGH (ref 3.5–5.2)
Sodium: 139 mmol/L (ref 134–144)
eGFR: 15 mL/min/{1.73_m2} — ABNORMAL LOW (ref >59)

## 2020-10-22 LAB — BRAIN NATRIURETIC PEPTIDE: BNP: 566.8 pg/mL — ABNORMAL HIGH (ref 0.0–100.0)

## 2020-10-23 ENCOUNTER — Other Ambulatory Visit: Payer: Self-pay | Admitting: Family Medicine

## 2020-10-23 ENCOUNTER — Telehealth: Payer: Self-pay | Admitting: Family Medicine

## 2020-10-23 DIAGNOSIS — E1151 Type 2 diabetes mellitus with diabetic peripheral angiopathy without gangrene: Secondary | ICD-10-CM | POA: Diagnosis not present

## 2020-10-23 DIAGNOSIS — E11622 Type 2 diabetes mellitus with other skin ulcer: Secondary | ICD-10-CM | POA: Diagnosis not present

## 2020-10-23 DIAGNOSIS — L97812 Non-pressure chronic ulcer of other part of right lower leg with fat layer exposed: Secondary | ICD-10-CM | POA: Diagnosis not present

## 2020-10-23 DIAGNOSIS — I890 Lymphedema, not elsewhere classified: Secondary | ICD-10-CM | POA: Diagnosis not present

## 2020-10-23 DIAGNOSIS — Z4800 Encounter for change or removal of nonsurgical wound dressing: Secondary | ICD-10-CM | POA: Diagnosis not present

## 2020-10-23 DIAGNOSIS — I509 Heart failure, unspecified: Secondary | ICD-10-CM | POA: Diagnosis not present

## 2020-10-23 DIAGNOSIS — L97822 Non-pressure chronic ulcer of other part of left lower leg with fat layer exposed: Secondary | ICD-10-CM | POA: Diagnosis not present

## 2020-10-23 DIAGNOSIS — I872 Venous insufficiency (chronic) (peripheral): Secondary | ICD-10-CM | POA: Diagnosis not present

## 2020-10-23 DIAGNOSIS — I130 Hypertensive heart and chronic kidney disease with heart failure and stage 1 through stage 4 chronic kidney disease, or unspecified chronic kidney disease: Secondary | ICD-10-CM | POA: Diagnosis not present

## 2020-10-23 DIAGNOSIS — N184 Chronic kidney disease, stage 4 (severe): Secondary | ICD-10-CM

## 2020-10-23 NOTE — Addendum Note (Signed)
Addended by: Vicente Males on: 10/23/2020 01:35 PM   Modules accepted: Orders

## 2020-10-23 NOTE — Telephone Encounter (Signed)
Seen this week

## 2020-10-23 NOTE — Telephone Encounter (Signed)
Pt daughter [name] contacted. Pt is taking 2 torsemide daily and see nephrology on June 8. Please advise. Thank you

## 2020-10-23 NOTE — Telephone Encounter (Signed)
I would recommend 2-1/2 tablets daily, should see improvement over the next 2 weeks If severe shortness of breath and inability to lay down because of this in some cases may have to go to the ER If not improving over the next 7 days please let us know Repeat met 7 in 2 to 3 weeks Keep follow-up visit in June sooner problems

## 2020-10-23 NOTE — Telephone Encounter (Signed)
Pt daughter contacted and verbalized understanding. Lab order placed

## 2020-10-23 NOTE — Telephone Encounter (Signed)
Nurses Please call [name] who is the niece of Loreena  Please verify diuretic pills torsemide. Are they currently taking 1 or 2 daily?  Blood work shows potassium is slightly up but also shows BNP is elevated which could be a sign of excessive fluid we may need to adjust the diuretic  Also when is patient's next visit with nephrology?  Please try to have these answers this morning thank you

## 2020-10-23 NOTE — Telephone Encounter (Signed)
Left message to return call 

## 2020-10-27 ENCOUNTER — Ambulatory Visit: Payer: Medicare HMO | Admitting: Internal Medicine

## 2020-10-27 DIAGNOSIS — L97812 Non-pressure chronic ulcer of other part of right lower leg with fat layer exposed: Secondary | ICD-10-CM | POA: Diagnosis not present

## 2020-10-27 DIAGNOSIS — I890 Lymphedema, not elsewhere classified: Secondary | ICD-10-CM | POA: Diagnosis not present

## 2020-10-27 DIAGNOSIS — I509 Heart failure, unspecified: Secondary | ICD-10-CM | POA: Diagnosis not present

## 2020-10-27 DIAGNOSIS — I872 Venous insufficiency (chronic) (peripheral): Secondary | ICD-10-CM | POA: Diagnosis not present

## 2020-10-27 DIAGNOSIS — L97822 Non-pressure chronic ulcer of other part of left lower leg with fat layer exposed: Secondary | ICD-10-CM | POA: Diagnosis not present

## 2020-10-27 DIAGNOSIS — Z4800 Encounter for change or removal of nonsurgical wound dressing: Secondary | ICD-10-CM | POA: Diagnosis not present

## 2020-10-27 DIAGNOSIS — I130 Hypertensive heart and chronic kidney disease with heart failure and stage 1 through stage 4 chronic kidney disease, or unspecified chronic kidney disease: Secondary | ICD-10-CM | POA: Diagnosis not present

## 2020-10-27 DIAGNOSIS — E11622 Type 2 diabetes mellitus with other skin ulcer: Secondary | ICD-10-CM | POA: Diagnosis not present

## 2020-10-27 DIAGNOSIS — E1151 Type 2 diabetes mellitus with diabetic peripheral angiopathy without gangrene: Secondary | ICD-10-CM | POA: Diagnosis not present

## 2020-10-29 ENCOUNTER — Other Ambulatory Visit: Payer: Self-pay | Admitting: Family Medicine

## 2020-10-29 NOTE — Telephone Encounter (Signed)
This with 2 refills When patient does follow-up visit in June she should bring her medications

## 2020-10-30 ENCOUNTER — Telehealth: Payer: Self-pay | Admitting: Family Medicine

## 2020-10-30 DIAGNOSIS — E11622 Type 2 diabetes mellitus with other skin ulcer: Secondary | ICD-10-CM | POA: Diagnosis not present

## 2020-10-30 DIAGNOSIS — I509 Heart failure, unspecified: Secondary | ICD-10-CM | POA: Diagnosis not present

## 2020-10-30 DIAGNOSIS — E1151 Type 2 diabetes mellitus with diabetic peripheral angiopathy without gangrene: Secondary | ICD-10-CM | POA: Diagnosis not present

## 2020-10-30 DIAGNOSIS — I130 Hypertensive heart and chronic kidney disease with heart failure and stage 1 through stage 4 chronic kidney disease, or unspecified chronic kidney disease: Secondary | ICD-10-CM | POA: Diagnosis not present

## 2020-10-30 DIAGNOSIS — Z4800 Encounter for change or removal of nonsurgical wound dressing: Secondary | ICD-10-CM | POA: Diagnosis not present

## 2020-10-30 DIAGNOSIS — I890 Lymphedema, not elsewhere classified: Secondary | ICD-10-CM | POA: Diagnosis not present

## 2020-10-30 DIAGNOSIS — I872 Venous insufficiency (chronic) (peripheral): Secondary | ICD-10-CM | POA: Diagnosis not present

## 2020-10-30 DIAGNOSIS — L97822 Non-pressure chronic ulcer of other part of left lower leg with fat layer exposed: Secondary | ICD-10-CM | POA: Diagnosis not present

## 2020-10-30 DIAGNOSIS — L97812 Non-pressure chronic ulcer of other part of right lower leg with fat layer exposed: Secondary | ICD-10-CM | POA: Diagnosis not present

## 2020-11-04 ENCOUNTER — Other Ambulatory Visit: Payer: Self-pay

## 2020-11-04 ENCOUNTER — Encounter: Payer: Medicare HMO | Admitting: Physician Assistant

## 2020-11-04 DIAGNOSIS — I890 Lymphedema, not elsewhere classified: Secondary | ICD-10-CM | POA: Diagnosis not present

## 2020-11-04 DIAGNOSIS — E1122 Type 2 diabetes mellitus with diabetic chronic kidney disease: Secondary | ICD-10-CM | POA: Diagnosis not present

## 2020-11-04 DIAGNOSIS — L97811 Non-pressure chronic ulcer of other part of right lower leg limited to breakdown of skin: Secondary | ICD-10-CM | POA: Diagnosis not present

## 2020-11-04 DIAGNOSIS — L97821 Non-pressure chronic ulcer of other part of left lower leg limited to breakdown of skin: Secondary | ICD-10-CM | POA: Diagnosis not present

## 2020-11-04 DIAGNOSIS — L97812 Non-pressure chronic ulcer of other part of right lower leg with fat layer exposed: Secondary | ICD-10-CM | POA: Diagnosis not present

## 2020-11-04 DIAGNOSIS — E11622 Type 2 diabetes mellitus with other skin ulcer: Secondary | ICD-10-CM | POA: Diagnosis not present

## 2020-11-04 DIAGNOSIS — M109 Gout, unspecified: Secondary | ICD-10-CM | POA: Diagnosis not present

## 2020-11-04 DIAGNOSIS — I480 Paroxysmal atrial fibrillation: Secondary | ICD-10-CM | POA: Diagnosis not present

## 2020-11-04 DIAGNOSIS — I872 Venous insufficiency (chronic) (peripheral): Secondary | ICD-10-CM | POA: Diagnosis not present

## 2020-11-04 DIAGNOSIS — M1990 Unspecified osteoarthritis, unspecified site: Secondary | ICD-10-CM | POA: Diagnosis not present

## 2020-11-04 NOTE — Progress Notes (Addendum)
[name], [name] ([id number]) Visit Report for 11/04/2020 Chief Complaint Document Details Patient Name: [name], [name]. Date of Service: 11/04/2020 12:45 PM Medical Record Number: [id number] Patient Account Number: [id number] Date of Birth/Sex: 10/02/1926 (87 y.o. F) Treating RN: Dolan Amen Primary Care Provider: Sallee Lange Other Clinician: Referring Provider: Sallee Lange Treating Provider/Extender: Skipper Cliche in Treatment: 7 Information Obtained from: Patient Chief Complaint Bilateral LE Ulcers Electronic Signature(s) Signed: 11/04/2020 1:14:06 PM By: Worthy Keeler PA-C Entered By: Worthy Keeler on 11/04/2020 13:14:06 [name] ([id number]) -------------------------------------------------------------------------------- HPI Details Patient Name: [name]. Date of Service: 11/04/2020 12:45 PM Medical Record Number: [id number] Patient Account Number: [id number] Date of Birth/Sex: Jul 23, 1926 (87 y.o. F) Treating RN: Dolan Amen Primary Care Provider: Sallee Lange Other Clinician: Referring Provider: Sallee Lange Treating Provider/Extender: Skipper Cliche in Treatment: 7 History of Present Illness HPI Description: 09/15/20 upon evaluation today patient presents for initial inspection here in our clinic concerning issues she has been having with blisters on the lower extremities bilaterally. She tells me that this began around the beginning of February. With that being said she does have a longstanding history of lymphedema she tells me. She also has chronic kidney disease stage IV which is causing some swelling as well her nephrologist did place her recently on Lasix which has helped to some degree. Her wounds actually appear to be healing which is great news. The patient does have a history of chronic venous insufficiency, lymphedema, diabetes mellitus type 2 which is controlled with diet she is not on any medications her last hemoglobin  A1c was 5.6. She also has atrial fibrillation, cardiac pacemaker, and again stage IV kidney disease. 09/22/2020 upon evaluation today patient appears to be doing well with regard to her wounds. She has been tolerating the dressing changes without complication. With that being said even in the past week she has made a dramatic improvement even though apparently the home health nurse actually placed her in an Unna boot wrap and not an actual 3 layer compression wrap. I suspect this was due to the fact that the patient did not have the medications needed to appropriately apply the dressings at home. This is fine but the nurse did not contact us to let me know. 4/28; the patient's left leg wounds on the medial aspect are healed on the right the wound that we had is measuring smaller but our nurse reports a new area in the original blistered area. We have been using alginate 10/13/2020 on evaluation today patient's wound is actually showing signs of good improvement. Fortunately there is no evidence of active infection at this time which is great news and overall very pleased with where things stand. No fevers, chills, nausea, vomiting, or diarrhea. 10/20/2020 upon evaluation today patient is actually showing signs of good improvement in regard to her right lower leg. She has been tolerating the dressing changes without complication. Fortunately there is no evidence of infection at this time which is great and overall I am extremely pleased with where she stands. 11/04/2020 upon evaluation today patient appears to be making great progress based on what I am seeing at this time. There does not appear to be any signs of infection and overall I am extremely pleased with where things stand currently. The patient does not appear to be having any evidence or sign of infection at this time which is great news and again in general I think that her venous leg ulcer is again significantly smaller  and while I am pleased  with this I think that it is going somewhat slowly. Nonetheless if things stall out or do not seem to be doing as well as I would like next week we may go ahead and switch to a different dressing possibly collagen. Electronic Signature(s) Signed: 11/04/2020 5:27:35 PM By: Worthy Keeler PA-C Entered By: Worthy Keeler on 11/04/2020 17:27:35 [name] ([id number]) -------------------------------------------------------------------------------- Physical Exam Details Patient Name: [name], [name]. Date of Service: 11/04/2020 12:45 PM Medical Record Number: [id number] Patient Account Number: [id number] Date of Birth/Sex: 10/14/1926 (87 y.o. F) Treating RN: Dolan Amen Primary Care Provider: Sallee Lange Other Clinician: Referring Provider: Sallee Lange Treating Provider/Extender: Skipper Cliche in Treatment: 7 Constitutional Obese and well-hydrated in no acute distress. Respiratory normal breathing without difficulty. Psychiatric this patient is able to make decisions and demonstrates good insight into disease process. Alert and Oriented x 3. pleasant and cooperative. Notes Upon inspection patient's wound did not require sharp debridement although I did clean this with saline and gauze by way of mechanical debridement she tolerated that today without complication. Otherwise I feel like the wound is actually doing quite well. Electronic Signature(s) Signed: 11/04/2020 5:27:50 PM By: Worthy Keeler PA-C Entered By: Worthy Keeler on 11/04/2020 17:27:50 [name] ([id number]) -------------------------------------------------------------------------------- Physician Orders Details Patient Name: [name]. Date of Service: 11/04/2020 12:45 PM Medical Record Number: [id number] Patient Account Number: [id number] Date of Birth/Sex: 1927/04/06 (87 y.o. F) Treating RN: Dolan Amen Primary Care Provider: Sallee Lange Other Clinician: Referring Provider:  Sallee Lange Treating Provider/Extender: Skipper Cliche in Treatment: 7 Verbal / Phone Orders: No Diagnosis Coding ICD-10 Coding Code Description I89.0 Lymphedema, not elsewhere classified I87.2 Venous insufficiency (chronic) (peripheral) L97.821 Non-pressure chronic ulcer of other part of left lower leg limited to breakdown of skin L97.811 Non-pressure chronic ulcer of other part of right lower leg limited to breakdown of skin E11.622 Type 2 diabetes mellitus with other skin ulcer I48.0 Paroxysmal atrial fibrillation Z95.0 Presence of cardiac pacemaker N18.4 Chronic kidney disease, stage 4 (severe) Follow-up Appointments o Return Appointment in 1 week. [facility] for wound care. May utilize formulary equivalent dressing for wound treatment orders unless otherwise specified. Home Health Nurse may visit [facility] to address patientos wound care needs. - [facility] o Scheduled days for dressing changes to be completed; exception, patient has scheduled wound care visit that day. - 2 x a week- Monday/Thursday or Tuesday/Friday o **Please direct any NON-WOUND related issues/requests for orders to patient's Primary Care Physician. **If current dressing causes regression in wound condition, may D/C ordered dressing product/s and apply Normal Saline Moist Dressing daily until next [place] or Other MD appointment. **Notify [facility] of regression in wound condition at [phone]. Bathing/ Shower/ Hygiene o May shower with wound dressing protected with water repellent cover or cast protector. Edema Control - Lymphedema / Segmental Compressive Device / Other Right Lower Extremity o Optional: One layer of unna paste to top of compression wrap (to act as an anchor). o 3 Layer Compression System for Lymphedema. o Patient to wear own Velcro compression garment. Remove compression stockings every night before going to bed and put on every  morning when getting up. - Left extremity-Farrow wrap o Elevate, Exercise Daily and Avoid Standing for Long Periods of Time. o Elevate legs to the level of the heart and pump ankles as often as possible o Elevate leg(s) parallel to the floor when sitting.  Wound Treatment Wound #2 - Lower Leg Wound Laterality: Right, Medial, Anterior Cleanser: Normal Saline (Generic) 2 x Per Week/30 Days Discharge Instructions: Wash your hands with soap and water. Remove old dressing, discard into plastic bag and place into trash. Cleanse the wound with Normal Saline prior to applying a clean dressing using gauze sponges, not tissues or cotton balls. Do not scrub or use excessive force. Pat dry using gauze sponges, not tissue or cotton balls. Primary Dressing: Hydrofera Blue Ready Transfer Foam, 2.5x2.5 (in/in) 2 x Per Week/30 Days Discharge Instructions: Apply Hydrofera Blue Ready to wound bed as directed Secondary Dressing: ABD Pad 5x9 (in/in) (Generic) 2 x Per Week/30 Days Discharge Instructions: Cover with ABD pad Compression Wrap: Profore Lite LF 3 Multilayer Compression Bandaging System (Generic) 2 x Per Week/30 Days Discharge Instructions: Apply 3 multi-layer wrap as prescribed. [name], [name] ([id number]) Electronic Signature(s) Signed: 11/04/2020 4:43:03 PM By: Charlett Nose RN Signed: 11/04/2020 5:49:10 PM By: Worthy Keeler PA-C Entered By: Georges Mouse, Minus Breeding on 11/04/2020 13:41:16 [name], [name] ([id number]) -------------------------------------------------------------------------------- Problem List Details Patient Name: [name], [name]. Date of Service: 11/04/2020 12:45 PM Medical Record Number: [id number] Patient Account Number: [id number] Date of Birth/Sex: Nov 23, 1926 (87 y.o. F) Treating RN: Dolan Amen Primary Care Provider: Sallee Lange Other Clinician: Referring Provider: Sallee Lange Treating Provider/Extender: Skipper Cliche in Treatment:  7 Active Problems ICD-10 Encounter Code Description Active Date MDM Diagnosis I89.0 Lymphedema, not elsewhere classified 09/15/2020 No Yes I87.2 Venous insufficiency (chronic) (peripheral) 09/15/2020 No Yes L97.821 Non-pressure chronic ulcer of other part of left lower leg limited to 09/15/2020 No Yes breakdown of skin L97.811 Non-pressure chronic ulcer of other part of right lower leg limited to 09/15/2020 No Yes breakdown of skin E11.622 Type 2 diabetes mellitus with other skin ulcer 09/15/2020 No Yes I48.0 Paroxysmal atrial fibrillation 09/15/2020 No Yes Z95.0 Presence of cardiac pacemaker 09/15/2020 No Yes N18.4 Chronic kidney disease, stage 4 (severe) 09/15/2020 No Yes Inactive Problems Resolved Problems Electronic Signature(s) Signed: 11/04/2020 1:14:00 PM By: Worthy Keeler PA-C Entered By: Worthy Keeler on 11/04/2020 13:13:59 [name] ([id number]) -------------------------------------------------------------------------------- Progress Note Details Patient Name: [name]. Date of Service: 11/04/2020 12:45 PM Medical Record Number: [id number] Patient Account Number: [id number] Date of Birth/Sex: 28-Mar-1927 (87 y.o. F) Treating RN: Dolan Amen Primary Care Provider: Sallee Lange Other Clinician: Referring Provider: Sallee Lange Treating Provider/Extender: Skipper Cliche in Treatment: 7 Subjective Chief Complaint Information obtained from Patient Bilateral LE Ulcers History of Present Illness (HPI) 09/15/20 upon evaluation today patient presents for initial inspection here in our clinic concerning issues she has been having with blisters on the lower extremities bilaterally. She tells me that this began around the beginning of February. With that being said she does have a longstanding history of lymphedema she tells me. She also has chronic kidney disease stage IV which is causing some swelling as well her nephrologist did place her recently on Lasix  which has helped to some degree. Her wounds actually appear to be healing which is great news. The patient does have a history of chronic venous insufficiency, lymphedema, diabetes mellitus type 2 which is controlled with diet she is not on any medications her last hemoglobin A1c was 5.6. She also has atrial fibrillation, cardiac pacemaker, and again stage IV kidney disease. 09/22/2020 upon evaluation today patient appears to be doing well with regard to her wounds. She has been tolerating the dressing changes without complication. With that being said even in the  past week she has made a dramatic improvement even though apparently the home health nurse actually placed her in an Unna boot wrap and not an actual 3 layer compression wrap. I suspect this was due to the fact that the patient did not have the medications needed to appropriately apply the dressings at home. This is fine but the nurse did not contact us to let me know. 4/28; the patient's left leg wounds on the medial aspect are healed on the right the wound that we had is measuring smaller but our nurse reports a new area in the original blistered area. We have been using alginate 10/13/2020 on evaluation today patient's wound is actually showing signs of good improvement. Fortunately there is no evidence of active infection at this time which is great news and overall very pleased with where things stand. No fevers, chills, nausea, vomiting, or diarrhea. 10/20/2020 upon evaluation today patient is actually showing signs of good improvement in regard to her right lower leg. She has been tolerating the dressing changes without complication. Fortunately there is no evidence of infection at this time which is great and overall I am extremely pleased with where she stands. 11/04/2020 upon evaluation today patient appears to be making great progress based on what I am seeing at this time. There does not appear to be any signs of infection and overall  I am extremely pleased with where things stand currently. The patient does not appear to be having any evidence or sign of infection at this time which is great news and again in general I think that her venous leg ulcer is again significantly smaller and while I am pleased with this I think that it is going somewhat slowly. Nonetheless if things stall out or do not seem to be doing as well as I would like next week we may go ahead and switch to a different dressing possibly collagen. Objective Constitutional Obese and well-hydrated in no acute distress. Vitals Time Taken: 1:20 PM, Height: 62 in, Weight: 166 lbs, BMI: 30.4, Temperature: 98.3 F, Pulse: 80 bpm, Respiratory Rate: 16 breaths/min, Blood Pressure: 158/70 mmHg. Respiratory normal breathing without difficulty. Psychiatric this patient is able to make decisions and demonstrates good insight into disease process. Alert and Oriented x 3. pleasant and cooperative. General Notes: Upon inspection patient's wound did not require sharp debridement although I did clean this with saline and gauze by way of mechanical debridement she tolerated that today without complication. Otherwise I feel like the wound is actually doing quite well. Integumentary (Hair, Skin) Wound #2 status is Open. Original cause of wound was Blister. The date acquired was: 07/08/2020. The wound has been in treatment 7 weeks. The wound is located on the Right,Medial,Anterior Lower Leg. The wound measures 2.5cm length x 1cm width x 0.1cm depth; 1.963cm^2 area and 0.196cm^3 volume. There is Fat Layer (Subcutaneous Tissue) exposed. There is no tunneling or undermining noted. There is a medium amount [name], [name]. ([id number]) of serous drainage noted. There is medium (34-66%) pink, pale granulation within the wound bed. There is a medium (34-66%) amount of necrotic tissue within the wound bed including Adherent Slough. Assessment Active Problems ICD-10 Lymphedema, not  elsewhere classified Venous insufficiency (chronic) (peripheral) Non-pressure chronic ulcer of other part of left lower leg limited to breakdown of skin Non-pressure chronic ulcer of other part of right lower leg limited to breakdown of skin Type 2 diabetes mellitus with other skin ulcer Paroxysmal atrial fibrillation Presence of cardiac pacemaker Chronic kidney  disease, stage 4 (severe) Procedures Wound #2 Pre-procedure diagnosis of Wound #2 is a Venous Leg Ulcer located on the Right,Medial,Anterior Lower Leg . There was a Three Layer Compression Therapy Procedure by Dolan Amen, RN. Post procedure Diagnosis Wound #2: Same as Pre-Procedure Notes: pt tolerating wrap well. Plan Follow-up Appointments: Return Appointment in 1 week. Home Health: [facility] for wound care. May utilize formulary equivalent dressing for wound treatment orders unless otherwise specified. Home Health Nurse may visit [facility] to address patient s wound care needs. - [facility] Scheduled days for dressing changes to be completed; exception, patient has scheduled wound care visit that day. - 2 x a week- Monday/Thursday or Tuesday/Friday **Please direct any NON-WOUND related issues/requests for orders to patient's Primary Care Physician. **If current dressing causes regression in wound condition, may D/C ordered dressing product/s and apply Normal Saline Moist Dressing daily until next [place] or Other MD appointment. **Notify [facility] of regression in wound condition at [phone]. Bathing/ Shower/ Hygiene: May shower with wound dressing protected with water repellent cover or cast protector. Edema Control - Lymphedema / Segmental Compressive Device / Other: Optional: One layer of unna paste to top of compression wrap (to act as an anchor). 3 Layer Compression System for Lymphedema. Patient to wear own Velcro compression garment. Remove compression stockings every night before going  to bed and put on every morning when getting up. - Left extremity-Farrow wrap Elevate, Exercise Daily and Avoid Standing for Long Periods of Time. Elevate legs to the level of the heart and pump ankles as often as possible Elevate leg(s) parallel to the floor when sitting. WOUND #2: - Lower Leg Wound Laterality: Right, Medial, Anterior Cleanser: Normal Saline (Generic) 2 x Per Week/30 Days Discharge Instructions: Wash your hands with soap and water. Remove old dressing, discard into plastic bag and place into trash. Cleanse the wound with Normal Saline prior to applying a clean dressing using gauze sponges, not tissues or cotton balls. Do not scrub or use excessive force. Pat dry using gauze sponges, not tissue or cotton balls. Primary Dressing: Hydrofera Blue Ready Transfer Foam, 2.5x2.5 (in/in) 2 x Per Week/30 Days Discharge Instructions: Apply Hydrofera Blue Ready to wound bed as directed Secondary Dressing: ABD Pad 5x9 (in/in) (Generic) 2 x Per Week/30 Days Discharge Instructions: Cover with ABD pad Compression Wrap: Profore Lite LF 3 Multilayer Compression Bandaging System (Generic) 2 x Per Week/30 Days Discharge Instructions: Apply 3 multi-layer wrap as prescribed. [name], [name]. ([id number]) 1. Would recommend currently that we continue with the [facility] for now since she is showing signs of improvement. However if things stall we will probably switch to a collagen based dressing. 2. I am also can recommend that we have the patient going continue with the ABD pads to cover followed by 3 layer compression wrap. We will see patient back for reevaluation in 1 week here in the clinic. If anything worsens or changes patient will contact our office for additional recommendations. Electronic Signature(s) Signed: 11/04/2020 5:28:13 PM By: Worthy Keeler PA-C Entered By: Worthy Keeler on 11/04/2020 17:28:13 [name], [name]  ([id number]) -------------------------------------------------------------------------------- SuperBill Details Patient Name: [name]. Date of Service: 11/04/2020 Medical Record Number: [id number] Patient Account Number: [id number] Date of Birth/Sex: [birth year] (87 y.o. F) Treating RN: Dolan Amen Primary Care Provider: Sallee Lange Other Clinician: Referring Provider: Sallee Lange Treating Provider/Extender: Skipper Cliche in Treatment: 7 Diagnosis Coding ICD-10 Codes Code Description I89.0 Lymphedema, not elsewhere classified I87.2 Venous  insufficiency (chronic) (peripheral) L97.821 Non-pressure chronic ulcer of other part of left lower leg limited to breakdown of skin L97.811 Non-pressure chronic ulcer of other part of right lower leg limited to breakdown of skin E11.622 Type 2 diabetes mellitus with other skin ulcer I48.0 Paroxysmal atrial fibrillation Z95.0 Presence of cardiac pacemaker N18.4 Chronic kidney disease, stage 4 (severe) Facility Procedures CPT4 Code: [id number] Description: (Facility Use Only) [id number] - APPLY MULTLAY COMPRS LWR RT LEG Modifier: Quantity: 1 Physician Procedures CPT4 [id number] Description: [id number] - WC PHYS LEVEL 3 - EST PT Modifier: Quantity: 1 CPT4 Code: Description: ICD-10 Diagnosis Description I89.0 Lymphedema, not elsewhere classified I87.2 Venous insufficiency (chronic) (peripheral) L97.821 Non-pressure chronic ulcer of other part of left lower leg limited to bre L97.811 Non-pressure chronic ulcer of  other part of right lower leg limited to br Modifier: akdown of skin eakdown of skin Quantity: Electronic Signature(s) Signed: 11/04/2020 5:28:44 PM By: Worthy Keeler PA-C Previous Signature: 11/04/2020 4:43:03 PM Version By: Georges Mouse, Minus Breeding RN Entered By: Worthy Keeler on 11/04/2020 17:28:44

## 2020-11-04 NOTE — Progress Notes (Addendum)
SYRIA, KESTNER ([id number]) Visit Report for 11/04/2020 Arrival Information Details Patient Name: [name], [name]. Date of Service: 11/04/2020 12:45 PM Medical Record Number: [id number] Patient Account Number: [id number] Date of Birth/Sex: 16-May-1927 (87 y.o. F) Treating RN: Dolan Amen Primary Care Haruka Kowaleski: Sallee Lange Other Clinician: Referring Ilyanna Baillargeon: Sallee Lange Treating Ashanti Littles/Extender: Skipper Cliche in Treatment: 7 Visit Information History Since Last Visit Added or deleted any medications: No Patient Arrived: Wheel Chair Had a fall or experienced change in No Arrival Time: 13:19 activities of daily living that may affect Accompanied By: daughter risk of falls: Transfer Assistance: EasyPivot Patient Lift Hospitalized since last visit: No Patient Identification Verified: Yes Pain Present Now: No Secondary Verification Process Yes Completed: Patient Has Alerts: Yes Patient Alerts: Patient on Blood Thinner on COUMADIN DIABETIC NON COMPRESSIBLE BIL LEGS Electronic Signature(s) Signed: 11/05/2020 4:53:17 PM By: Jeanine Luz Entered By: Jeanine Luz on 11/04/2020 13:20:10 [name] ([id number]) -------------------------------------------------------------------------------- Clinic Level of Care Assessment Details Patient Name: [name]. Date of Service: 11/04/2020 12:45 PM Medical Record Number: [id number] Patient Account Number: [id number] Date of Birth/Sex: 03-30-1927 (87 y.o. F) Treating RN: Dolan Amen Primary Care Corinna Burkman: Sallee Lange Other Clinician: Referring Sara Selvidge: Sallee Lange Treating Milo Schreier/Extender: Skipper Cliche in Treatment: 7 Clinic Level of Care Assessment Items TOOL 1 Quantity Score []  - Use when EandM and Procedure is performed on INITIAL visit 0 ASSESSMENTS - Nursing Assessment / Reassessment []  - General Physical Exam (combine w/ comprehensive assessment (listed just below) when performed on  new 0 pt. evals) []  - 0 Comprehensive Assessment (HX, ROS, Risk Assessments, Wounds Hx, etc.) ASSESSMENTS - Wound and Skin Assessment / Reassessment []  - Dermatologic / Skin Assessment (not related to wound area) 0 ASSESSMENTS - Ostomy and/or Continence Assessment and Care []  - Incontinence Assessment and Management 0 []  - 0 Ostomy Care Assessment and Management (repouching, etc.) PROCESS - Coordination of Care []  - Simple Patient / Family Education for ongoing care 0 []  - 0 Complex (extensive) Patient / Family Education for ongoing care []  - 0 Staff obtains Consents, Records, Test Results / Process Orders []  - 0 Staff telephones HHA, Nursing Homes / Clarify orders / etc []  - 0 Routine Transfer to another Facility (non-emergent condition) []  - 0 Routine Hospital Admission (non-emergent condition) []  - 0 New Admissions / Biomedical engineer / Ordering NPWT, Apligraf, etc. []  - 0 Emergency Hospital Admission (emergent condition) PROCESS - Special Needs []  - Pediatric / Minor Patient Management 0 []  - 0 Isolation Patient Management []  - 0 Hearing / Language / Visual special needs []  - 0 Assessment of Community assistance (transportation, D/C planning, etc.) []  - 0 Additional assistance / Altered mentation []  - 0 Support Surface(s) Assessment (bed, cushion, seat, etc.) INTERVENTIONS - Miscellaneous []  - External ear exam 0 []  - 0 Patient Transfer (multiple staff / Civil Service fast streamer / Similar devices) []  - 0 Simple Staple / Suture removal (25 or less) []  - 0 Complex Staple / Suture removal (26 or more) []  - 0 Hypo/Hyperglycemic Management (do not check if billed separately) []  - 0 Ankle / Brachial Index (ABI) - do not check if billed separately Has the patient been seen at the hospital within the last three years: Yes Total Score: 0 Level Of Care: ____ [name] ([id number]) Electronic Signature(s) Signed: 11/04/2020 4:43:03 PM By: Georges Mouse, Minus Breeding  RN Entered By: Georges Mouse, Minus Breeding on 11/04/2020 13:41:21 [name] ([id number]) -------------------------------------------------------------------------------- Compression Therapy Details Patient Name: [name]. Date  of Service: 11/04/2020 12:45 PM Medical Record Number: [id number] Patient Account Number: [id number] Date of Birth/Sex: 1927/04/18 (87 y.o. F) Treating RN: Dolan Amen Primary Care Riham Polyakov: Sallee Lange Other Clinician: Referring Shataria Crist: Sallee Lange Treating Zoraya Fiorenza/Extender: Skipper Cliche in Treatment: 7 Compression Therapy Performed for Wound Assessment: Wound #2 Right,Medial,Anterior Lower Leg Performed By: Cora Daniels, RN Compression Type: Three Layer Post Procedure Diagnosis Same as Pre-procedure Notes pt tolerating wrap well Electronic Signature(s) Signed: 11/04/2020 4:43:03 PM By: Georges Mouse, Minus Breeding RN Entered By: Georges Mouse, Kenia on 11/04/2020 13:40:43 [name] ([id number]) -------------------------------------------------------------------------------- Encounter Discharge Information Details Patient Name: [name]. Date of Service: 11/04/2020 12:45 PM Medical Record Number: [id number] Patient Account Number: [id number] Date of Birth/Sex: [birth year] (87 y.o. F) Treating RN: Dolan Amen Primary Care Ortencia Askari: Sallee Lange Other Clinician: Referring Syrina Wake: Sallee Lange Treating Leyah Bocchino/Extender: Skipper Cliche in Treatment: 7 Encounter Discharge Information Items Discharge Condition: Stable Ambulatory Status: Wheelchair Discharge Destination: Home Transportation: Private Auto Accompanied By: daughter Schedule Follow-up Appointment: Yes Clinical Summary of Care: Electronic Signature(s) Signed: 11/05/2020 4:53:17 PM By: Jeanine Luz Entered By: Jeanine Luz on 11/04/2020 13:58:37 [name]  ([id number]) -------------------------------------------------------------------------------- Lower Extremity Assessment Details Patient Name: [name]. Date of Service: 11/04/2020 12:45 PM Medical Record Number: [id number] Patient Account Number: [id number] Date of Birth/Sex: Dec 24, 1926 (87 y.o. F) Treating RN: Dolan Amen Primary Care Hill Mackie: Sallee Lange Other Clinician: Referring Carlethia Mesquita: Sallee Lange Treating [place] Warriner/Extender: Jeri Cos Weeks in Treatment: 7 Edema Assessment Assessed: [Left: Yes] [Right: No] Edema: [Left: Ye] [Right: s] Calf Left: Right: Point of Measurement: 31 cm From Medial Instep 39.5 cm Ankle Left: Right: Point of Measurement: 9 cm From Medial Instep 18.3 cm Vascular Assessment Pulses: Dorsalis Pedis Palpable: [Left:Yes] Electronic Signature(s) Signed: 11/04/2020 4:43:03 PM By: Georges Mouse, Minus Breeding RN Signed: 11/05/2020 4:53:17 PM By: Jeanine Luz Entered By: Jeanine Luz on 11/04/2020 13:33:45 [name] ([id number]) -------------------------------------------------------------------------------- Multi Wound Chart Details Patient Name: [name]. Date of Service: 11/04/2020 12:45 PM Medical Record Number: [id number] Patient Account Number: [id number] Date of Birth/Sex: 1926-07-28 (87 y.o. F) Treating RN: Dolan Amen Primary Care Ridhi Hoffert: Sallee Lange Other Clinician: Referring Avina Eberle: Sallee Lange Treating Daven Montz/Extender: Skipper Cliche in Treatment: 7 Vital Signs Height(in): 13 Pulse(bpm): 80 Weight(lbs): 166 Blood Pressure(mmHg): 158/70 Body Mass Index(BMI): 30 Temperature(F): 98.3 Respiratory Rate(breaths/min): 16 Photos: [N/A:N/A] Wound Location: Right, Medial, Anterior Lower Leg N/A N/A Wounding Event: Blister N/A N/A Primary Etiology: Venous Leg Ulcer N/A N/A Secondary Etiology: Lymphedema N/A N/A Comorbid History: Cataracts, Anemia, Lymphedema, N/A N/A Arrhythmia, Congestive  Heart Failure, Coronary Artery Disease, Hypertension, Type II Diabetes, Gout, Osteoarthritis Date Acquired: 07/08/2020 N/A N/A Weeks of Treatment: 7 N/A N/A Wound Status: Open N/A N/A Measurements L x W x D (cm) 2.5x1x0.1 N/A N/A Area (cm) : 1.963 N/A N/A Volume (cm) : 0.196 N/A N/A % Reduction in Area: 95.80% N/A N/A % Reduction in Volume: 95.80% N/A N/A Classification: Full Thickness Without Exposed N/A N/A Support Structures Exudate Amount: Medium N/A N/A Exudate Type: Serous N/A N/A Exudate Color: amber N/A N/A Granulation Amount: Medium (34-66%) N/A N/A Granulation Quality: Pink, Pale N/A N/A Necrotic Amount: Medium (34-66%) N/A N/A Exposed Structures: Fat Layer (Subcutaneous Tissue): N/A N/A Yes Fascia: No Tendon: No Muscle: No Joint: No Bone: No Epithelialization: Small (1-33%) N/A N/A Treatment Notes Electronic Signature(s) Signed: 11/04/2020 4:43:03 PM By: Georges Mouse, Minus Breeding RN Entered By: Georges Mouse, Kenia on 11/04/2020 13:40:12 [name] ([id number], ERSKIN. ([id number]) -------------------------------------------------------------------------------- Multi-Disciplinary  Care Plan Details Patient Name: [name], [name]. Date of Service: 11/04/2020 12:45 PM Medical Record Number: [id number] Patient Account Number: [id number] Date of Birth/Sex: [birth year] (87 y.o. F) Treating RN: Dolan Amen Primary Care Emersen Mascari: Sallee Lange Other Clinician: Referring Suan Pyeatt: Sallee Lange Treating Fabion Gatson/Extender: Skipper Cliche in Treatment: 7 Active Inactive Wound/Skin Impairment Nursing Diagnoses: Knowledge deficit related to ulceration/compromised skin integrity Goals: Patient/caregiver will verbalize understanding of skin care regimen Date Initiated: 09/15/2020 Date Inactivated: 10/13/2020 Target Resolution Date: 10/15/2020 Goal Status: Met Ulcer/skin breakdown will have a volume reduction of 30% by week 4 Date Initiated:  09/15/2020 Date Inactivated: 11/04/2020 Target Resolution Date: 11/15/2020 Goal Status: Met Ulcer/skin breakdown will have a volume reduction of 50% by week 8 Date Initiated: 09/15/2020 Target Resolution Date: 12/15/2020 Goal Status: Active Ulcer/skin breakdown will have a volume reduction of 80% by week 12 Date Initiated: 09/15/2020 Target Resolution Date: 01/15/2021 Goal Status: Active Ulcer/skin breakdown will heal within 14 weeks Date Initiated: 09/15/2020 Target Resolution Date: 02/15/2021 Goal Status: Active Interventions: Assess patient/caregiver ability to obtain necessary supplies Assess patient/caregiver ability to perform ulcer/skin care regimen upon admission and as needed Assess ulceration(s) every visit Notes: Electronic Signature(s) Signed: 11/04/2020 4:43:03 PM By: Georges Mouse, Minus Breeding RN Entered By: Georges Mouse, Minus Breeding on 11/04/2020 13:40:03 [name] ([id number]) -------------------------------------------------------------------------------- Pain Assessment Details Patient Name: [name]. Date of Service: 11/04/2020 12:45 PM Medical Record Number: [id number] Patient Account Number: [id number] Date of Birth/Sex: Feb 15, 1927 (87 y.o. F) Treating RN: Dolan Amen Primary Care Rossi Burdo: Sallee Lange Other Clinician: Referring Dreden Rivere: Sallee Lange Treating Kreig Parson/Extender: Skipper Cliche in Treatment: 7 Active Problems Location of Pain Severity and Description of Pain Patient Has Paino No Site Locations Rate the pain. Current Pain Level: 0 Pain Management and Medication Current Pain Management: Electronic Signature(s) Signed: 11/04/2020 4:43:03 PM By: Georges Mouse, Minus Breeding RN Signed: 11/05/2020 4:53:17 PM By: Jeanine Luz Entered By: Jeanine Luz on 11/04/2020 13:22:48 [name] ([id number]) -------------------------------------------------------------------------------- Patient/Caregiver Education Details Patient  Name: [name]. Date of Service: 11/04/2020 12:45 PM Medical Record Number: [id number] Patient Account Number: [id number] Date of Birth/Gender: March 19, 1927 (87 y.o. F) Treating RN: Dolan Amen Primary Care Physician: Sallee Lange Other Clinician: Referring Physician: Sallee Lange Treating Physician/Extender: Skipper Cliche in Treatment: 7 Education Assessment Education Provided To: Patient Education Topics Provided Wound/Skin Impairment: Methods: Explain/Verbal Responses: State content correctly Electronic Signature(s) Signed: 11/04/2020 4:43:03 PM By: Georges Mouse, Minus Breeding RN Entered By: Georges Mouse, Minus Breeding on 11/04/2020 13:41:37 [name] ([id number]) -------------------------------------------------------------------------------- Wound Assessment Details Patient Name: [name]. Date of Service: 11/04/2020 12:45 PM Medical Record Number: [id number] Patient Account Number: [id number] Date of Birth/Sex: 12/05/1926 (87 y.o. F) Treating RN: Dolan Amen Primary Care Johathon Overturf: Sallee Lange Other Clinician: Referring Trevor Duty: Sallee Lange Treating Mariyanna Mucha/Extender: Jeri Cos Weeks in Treatment: 7 Wound Status Wound Number: 2 Primary Venous Leg Ulcer Etiology: Wound Location: Right, Medial, Anterior Lower Leg Secondary Lymphedema Wounding Event: Blister Etiology: Date Acquired: 07/08/2020 Wound Open Weeks Of Treatment: 7 Status: Clustered Wound: No Comorbid Cataracts, Anemia, Lymphedema, Arrhythmia, Congestive History: Heart Failure, Coronary Artery Disease, Hypertension, Type II Diabetes, Gout, Osteoarthritis Photos Wound Measurements Length: (cm) 2.5 Width: (cm) 1 Depth: (cm) 0.1 Area: (cm) 1.963 Volume: (cm) 0.196 % Reduction in Area: 95.8% % Reduction in Volume: 95.8% Epithelialization: Small (1-33%) Tunneling: No Undermining: No Wound Description Classification: Full Thickness Without Exposed Support  Structures Exudate Amount: Medium Exudate Type: Serous Exudate Color: amber Foul Odor After Cleansing: No Slough/Fibrino Yes Wound Bed Granulation  Amount: Medium (34-66%) Exposed Structure Granulation Quality: Pink, Pale Fascia Exposed: No Necrotic Amount: Medium (34-66%) Fat Layer (Subcutaneous Tissue) Exposed: Yes Necrotic Quality: Adherent Slough Tendon Exposed: No Muscle Exposed: No Joint Exposed: No Bone Exposed: No Treatment Notes Wound #2 (Lower Leg) Wound Laterality: Right, Medial, Anterior Cleanser Normal Saline Discharge Instruction: Wash your hands with soap and water. Remove old dressing, discard into plastic bag and place into trash. Cleanse the wound with Normal Saline prior to applying a clean dressing using gauze sponges, not tissues or cotton balls. Do not [name], [name]. ([id number]) scrub or use excessive force. Pat dry using gauze sponges, not tissue or cotton balls. Peri-Wound Care Topical Primary Dressing Hydrofera Blue Ready Transfer Foam, 2.5x2.5 (in/in) Discharge Instruction: Apply Hydrofera Blue Ready to wound bed as directed Secondary Dressing ABD Pad 5x9 (in/in) Discharge Instruction: Cover with ABD pad Secured With Compression Wrap Profore Lite LF 3 Multilayer Compression [place] Discharge Instruction: Apply 3 multi-layer wrap as prescribed. Compression Stockings Add-Ons Electronic Signature(s) Signed: 11/04/2020 4:43:03 PM By: Georges Mouse, Minus Breeding RN Signed: 11/05/2020 4:53:17 PM By: Jeanine Luz Entered By: Jeanine Luz on 11/04/2020 13:31:57 [name] ([id number]) -------------------------------------------------------------------------------- Vitals Details Patient Name: [name]. Date of Service: 11/04/2020 12:45 PM Medical Record Number: [id number] Patient Account Number: [id number] Date of Birth/Sex: 11-28-1926 (87 y.o. F) Treating RN: Dolan Amen Primary Care Nyles Mitton: Sallee Lange Other  Clinician: Referring Sumayyah Custodio: Sallee Lange Treating Othell Jaime/Extender: Skipper Cliche in Treatment: 7 Vital Signs Time Taken: 13:20 Temperature (F): 98.3 Height (in): 62 Pulse (bpm): 80 Weight (lbs): 166 Respiratory Rate (breaths/min): 16 Body Mass Index (BMI): 30.4 Blood Pressure (mmHg): 158/70 Reference Range: 80 - 120 mg / dl Electronic Signature(s) Signed: 11/05/2020 4:53:17 PM By: Jeanine Luz Entered By: Jeanine Luz on 11/04/2020 13:22:28

## 2020-11-07 ENCOUNTER — Telehealth: Payer: Self-pay

## 2020-11-07 NOTE — Telephone Encounter (Signed)
Left message to return call 

## 2020-11-07 NOTE — Telephone Encounter (Signed)
The ambulance come by this morning she has been throwing up anf they did not see nothing wrong but [name] seems to think that it is a virus and was told to let Dr Nicki Reaper know.  Pt call back [phone]

## 2020-11-08 DIAGNOSIS — I872 Venous insufficiency (chronic) (peripheral): Secondary | ICD-10-CM | POA: Diagnosis not present

## 2020-11-08 DIAGNOSIS — L97822 Non-pressure chronic ulcer of other part of left lower leg with fat layer exposed: Secondary | ICD-10-CM | POA: Diagnosis not present

## 2020-11-08 DIAGNOSIS — E1151 Type 2 diabetes mellitus with diabetic peripheral angiopathy without gangrene: Secondary | ICD-10-CM | POA: Diagnosis not present

## 2020-11-08 DIAGNOSIS — L97812 Non-pressure chronic ulcer of other part of right lower leg with fat layer exposed: Secondary | ICD-10-CM | POA: Diagnosis not present

## 2020-11-08 DIAGNOSIS — Z4800 Encounter for change or removal of nonsurgical wound dressing: Secondary | ICD-10-CM | POA: Diagnosis not present

## 2020-11-08 DIAGNOSIS — I130 Hypertensive heart and chronic kidney disease with heart failure and stage 1 through stage 4 chronic kidney disease, or unspecified chronic kidney disease: Secondary | ICD-10-CM | POA: Diagnosis not present

## 2020-11-08 DIAGNOSIS — E11622 Type 2 diabetes mellitus with other skin ulcer: Secondary | ICD-10-CM | POA: Diagnosis not present

## 2020-11-08 DIAGNOSIS — I509 Heart failure, unspecified: Secondary | ICD-10-CM | POA: Diagnosis not present

## 2020-11-08 DIAGNOSIS — I890 Lymphedema, not elsewhere classified: Secondary | ICD-10-CM | POA: Diagnosis not present

## 2020-11-10 ENCOUNTER — Encounter: Payer: Medicare HMO | Attending: Physician Assistant | Admitting: Physician Assistant

## 2020-11-10 ENCOUNTER — Other Ambulatory Visit: Payer: Self-pay

## 2020-11-10 DIAGNOSIS — Z79899 Other long term (current) drug therapy: Secondary | ICD-10-CM | POA: Diagnosis not present

## 2020-11-10 DIAGNOSIS — I890 Lymphedema, not elsewhere classified: Secondary | ICD-10-CM | POA: Diagnosis not present

## 2020-11-10 DIAGNOSIS — Z950 Presence of cardiac pacemaker: Secondary | ICD-10-CM | POA: Insufficient documentation

## 2020-11-10 DIAGNOSIS — N184 Chronic kidney disease, stage 4 (severe): Secondary | ICD-10-CM | POA: Diagnosis not present

## 2020-11-10 DIAGNOSIS — E11622 Type 2 diabetes mellitus with other skin ulcer: Secondary | ICD-10-CM | POA: Insufficient documentation

## 2020-11-10 DIAGNOSIS — L97821 Non-pressure chronic ulcer of other part of left lower leg limited to breakdown of skin: Secondary | ICD-10-CM | POA: Insufficient documentation

## 2020-11-10 DIAGNOSIS — L97811 Non-pressure chronic ulcer of other part of right lower leg limited to breakdown of skin: Secondary | ICD-10-CM | POA: Insufficient documentation

## 2020-11-10 DIAGNOSIS — E1122 Type 2 diabetes mellitus with diabetic chronic kidney disease: Secondary | ICD-10-CM | POA: Insufficient documentation

## 2020-11-10 DIAGNOSIS — Z885 Allergy status to narcotic agent status: Secondary | ICD-10-CM | POA: Diagnosis not present

## 2020-11-10 DIAGNOSIS — I872 Venous insufficiency (chronic) (peripheral): Secondary | ICD-10-CM | POA: Diagnosis not present

## 2020-11-10 DIAGNOSIS — I480 Paroxysmal atrial fibrillation: Secondary | ICD-10-CM | POA: Diagnosis not present

## 2020-11-10 DIAGNOSIS — L97812 Non-pressure chronic ulcer of other part of right lower leg with fat layer exposed: Secondary | ICD-10-CM | POA: Diagnosis not present

## 2020-11-10 NOTE — Progress Notes (Addendum)
[name], [name] ([id number]) Visit Report for 11/10/2020 Arrival Information Details Patient Name: [name], [name]. Date of Service: 11/10/2020 11:15 AM Medical Record Number: [id number] Patient Account Number: [id number] Date of Birth/Sex: 1926-10-16 (87 y.o. F) Treating RN: Donnamarie Poag Primary Care : Sallee Lange Other Clinician: Referring : Sallee Lange Treating /Extender: Skipper Cliche in Treatment: 8 Visit Information History Since Last Visit Added or deleted any medications: No Patient Arrived: Wheel Chair Had a fall or experienced change in No Arrival Time: 11:31 activities of daily living that may affect Accompanied By: husband risk of falls: Transfer Assistance: None Hospitalized since last visit: No Patient Identification Verified: Yes Pain Present Now: No Secondary Verification Process Yes Completed: Patient Has Alerts: Yes Patient Alerts: Patient on Blood Thinner on COUMADIN DIABETIC NON COMPRESSIBLE BIL LEGS Electronic Signature(s) Signed: 11/10/2020 3:36:07 PM By: Jeanine Luz Entered By: Jeanine Luz on 11/10/2020 11:31:43 [name] ([id number]) -------------------------------------------------------------------------------- Clinic Level of Care Assessment Details Patient Name: [name]. Date of Service: 11/10/2020 11:15 AM Medical Record Number: [id number] Patient Account Number: [id number] Date of Birth/Sex: June 12, 1926 (87 y.o. F) Treating RN: Donnamarie Poag Primary Care : Sallee Lange Other Clinician: Referring : Sallee Lange Treating /Extender: Skipper Cliche in Treatment: 8 Clinic Level of Care Assessment Items TOOL 1 Quantity Score [] - Use when EandM and Procedure is performed on INITIAL visit 0 ASSESSMENTS - Nursing Assessment / Reassessment [] - General Physical Exam (combine w/ comprehensive assessment (listed just below) when performed on new 0 pt. evals) [] -  0 Comprehensive Assessment (HX, ROS, Risk Assessments, Wounds Hx, etc.) ASSESSMENTS - Wound and Skin Assessment / Reassessment [] - Dermatologic / Skin Assessment (not related to wound area) 0 ASSESSMENTS - Ostomy and/or Continence Assessment and Care [] - Incontinence Assessment and Management 0 [] - 0 Ostomy Care Assessment and Management (repouching, etc.) PROCESS - Coordination of Care [] - Simple Patient / Family Education for ongoing care 0 [] - 0 Complex (extensive) Patient / Family Education for ongoing care [] - 0 Staff obtains Consents, Records, Test Results / Process Orders [] - 0 Staff telephones HHA, Nursing Homes / Clarify orders / etc [] - 0 Routine Transfer to another Facility (non-emergent condition) [] - 0 Routine Hospital Admission (non-emergent condition) [] - 0 New Admissions / Biomedical engineer / Ordering NPWT, Apligraf, etc. [] - 0 Emergency Hospital Admission (emergent condition) PROCESS - Special Needs [] - Pediatric / Minor Patient Management 0 [] - 0 Isolation Patient Management [] - 0 Hearing / Language / Visual special needs [] - 0 Assessment of Community assistance (transportation, D/C planning, etc.) [] - 0 Additional assistance / Altered mentation [] - 0 Support Surface(s) Assessment (bed, cushion, seat, etc.) INTERVENTIONS - Miscellaneous [] - External ear exam 0 [] - 0 Patient Transfer (multiple staff / Civil Service fast streamer / Similar devices) [] - 0 Simple Staple / Suture removal (25 or less) [] - 0 Complex Staple / Suture removal (26 or more) [] - 0 Hypo/Hyperglycemic Management (do not check if billed separately) [] - 0 Ankle / Brachial Index (ABI) - do not check if billed separately Has the patient been seen at the hospital within the last three years: Yes Total Score: 0 Level Of Care: ____ [name] ([id number]) Electronic Signature(s) Signed: 11/10/2020 3:39:51 PM By: Donnamarie Poag Entered By: Donnamarie Poag on 11/10/2020  11:55:41 [name] ([id number]) -------------------------------------------------------------------------------- Encounter Discharge Information Details Patient Name: [name]. Date of Service: 11/10/2020 11:15  AM Medical Record Number: [id number] Patient Account Number: [id number] Date of Birth/Sex: [birth year] (87 y.o. F) Treating RN: Carlene Coria Primary Care Lylith Bebeau: Sallee Lange Other Clinician: Referring Ariahna Smiddy: Sallee Lange Treating Igor Bishop/Extender: Skipper Cliche in Treatment: 8 Encounter Discharge Information Items Post Procedure Vitals Discharge Condition: Stable Temperature (F): 98.6 Ambulatory Status: Wheelchair Pulse (bpm): 73 Discharge Destination: Home Respiratory Rate (breaths/min): 16 Transportation: Private Auto Blood Pressure (mmHg): 152/76 Accompanied By: self Schedule Follow-up Appointment: Yes Clinical Summary of Care: Patient Declined Electronic Signature(s) Signed: 11/11/2020 8:01:20 AM By: Carlene Coria RN Entered By: Carlene Coria on 11/10/2020 12:15:41 [name] ([id number]) -------------------------------------------------------------------------------- Lower Extremity Assessment Details Patient Name: [name]. Date of Service: 11/10/2020 11:15 AM Medical Record Number: [id number] Patient Account Number: [id number] Date of Birth/Sex: [birth year] (87 y.o. F) Treating RN: Donnamarie Poag Primary Care Nyiah Pianka: Sallee Lange Other Clinician: Referring Julies Carmickle: Sallee Lange Treating Jerrelle Michelsen/Extender: Skipper Cliche in Treatment: 8 Edema Assessment Assessed: [Left: Yes] [Right: No] Edema: [Left: Ye] [Right: s] Calf Left: Right: Point of Measurement: 31 cm From Medial Instep 37.5 cm Ankle Left: Right: Point of Measurement: 9 cm From Medial Instep 21.5 cm Vascular Assessment Pulses: Dorsalis Pedis Palpable: [Left:Yes] Electronic Signature(s) Signed: 11/10/2020 3:36:07 PM By: Jeanine Luz Signed: 11/10/2020  3:39:51 PM By: Donnamarie Poag Entered By: Jeanine Luz on 11/10/2020 11:42:09 [name] ([id number]) -------------------------------------------------------------------------------- Multi Wound Chart Details Patient Name: [name]. Date of Service: 11/10/2020 11:15 AM Medical Record Number: [id number] Patient Account Number: [id number] Date of Birth/Sex: 1926-11-08 (87 y.o. F) Treating RN: Donnamarie Poag Primary Care Merrill Deanda: Sallee Lange Other Clinician: Referring Burdette Gergely: Sallee Lange Treating Unique Searfoss/Extender: Skipper Cliche in Treatment: 8 Vital Signs Height(in): 28 Pulse(bpm): 51 Weight(lbs): 166 Blood Pressure(mmHg): 152/76 Body Mass Index(BMI): 30 Temperature(F): 98.6 Respiratory Rate(breaths/min): 16 Photos: [N/A:N/A] Wound Location: Right, Medial, Anterior Lower Leg N/A N/A Wounding Event: Blister N/A N/A Primary Etiology: Venous Leg Ulcer N/A N/A Secondary Etiology: Lymphedema N/A N/A Comorbid History: Cataracts, Anemia, Lymphedema, N/A N/A Arrhythmia, Congestive Heart Failure, Coronary Artery Disease, Hypertension, Type II Diabetes, Gout, Osteoarthritis Date Acquired: 07/08/2020 N/A N/A Weeks of Treatment: 8 N/A N/A Wound Status: Open N/A N/A Measurements L x W x D (cm) 2.5x0.8x0.1 N/A N/A Area (cm) : 1.571 N/A N/A Volume (cm) : 0.157 N/A N/A % Reduction in Area: 96.60% N/A N/A % Reduction in Volume: 96.60% N/A N/A Classification: Full Thickness Without Exposed N/A N/A Support Structures Exudate Amount: Medium N/A N/A Exudate Type: Serous N/A N/A Exudate Color: amber N/A N/A Granulation Amount: Medium (34-66%) N/A N/A Granulation Quality: Pink, Pale N/A N/A Necrotic Amount: Medium (34-66%) N/A N/A Exposed Structures: Fat Layer (Subcutaneous Tissue): N/A N/A Yes Fascia: No Tendon: No Muscle: No Joint: No Bone: No Epithelialization: Small (1-33%) N/A N/A Debridement: Debridement - Excisional N/A N/A Pre-procedure  Verification/Time 11:48 N/A N/A Out Taken: Tissue Debrided: Subcutaneous, Slough N/A N/A Level: Skin/Subcutaneous Tissue N/A N/A Debridement Area (sq cm): 2 N/A N/A Instrument: Curette N/A N/A Bleeding: Minimum N/A N/A Hemostasis Achieved: Pressure N/A N/A Procedure was tolerated well N/A N/A [name], [name] ([id number]) Debridement Treatment Response: Post Debridement 2.5x0.8x0.2 N/A N/A Measurements L x W x D (cm) Post Debridement Volume: 0.314 N/A N/A (cm) Procedures Performed: Debridement N/A N/A Treatment Notes Electronic Signature(s) Signed: 11/10/2020 3:39:51 PM By: Donnamarie Poag Entered By: Donnamarie Poag on 11/10/2020 11:54:04 [name] ([id number]) -------------------------------------------------------------------------------- Multi-Disciplinary Care Plan Details Patient Name: [name]. Date of Service: 11/10/2020 11:15 AM Medical Record Number: [id number] Patient Account Number:  [id number] Date of Birth/Sex: May 11, 1927 (87 y.o. F) Treating RN: Donnamarie Poag Primary Care : Sallee Lange Other Clinician: Referring : Sallee Lange Treating /Extender: Skipper Cliche in Treatment: 8 Active Inactive Wound/Skin Impairment Nursing Diagnoses: Knowledge deficit related to ulceration/compromised skin integrity Goals: Patient/caregiver will verbalize understanding of skin care regimen Date Initiated: 09/15/2020 Date Inactivated: 10/13/2020 Target Resolution Date: 10/15/2020 Goal Status: Met Ulcer/skin breakdown will have a volume reduction of 30% by week 4 Date Initiated: 09/15/2020 Date Inactivated: 11/04/2020 Target Resolution Date: 11/15/2020 Goal Status: Met Ulcer/skin breakdown will have a volume reduction of 50% by week 8 Date Initiated: 09/15/2020 Target Resolution Date: 12/15/2020 Goal Status: Active Ulcer/skin breakdown will have a volume reduction of 80% by week 12 Date Initiated: 09/15/2020 Target Resolution Date:  01/15/2021 Goal Status: Active Ulcer/skin breakdown will heal within 14 weeks Date Initiated: 09/15/2020 Target Resolution Date: 02/15/2021 Goal Status: Active Interventions: Assess patient/caregiver ability to obtain necessary supplies Assess patient/caregiver ability to perform ulcer/skin care regimen upon admission and as needed Assess ulceration(s) every visit Notes: Electronic Signature(s) Signed: 11/10/2020 3:39:51 PM By: Donnamarie Poag Entered By: Donnamarie Poag on 11/10/2020 11:53:09 [name] ([id number]) -------------------------------------------------------------------------------- Pain Assessment Details Patient Name: [name]. Date of Service: 11/10/2020 11:15 AM Medical Record Number: [id number] Patient Account Number: [id number] Date of Birth/Sex: January 01, 1927 (87 y.o. F) Treating RN: Donnamarie Poag Primary Care : Sallee Lange Other Clinician: Referring : Sallee Lange Treating /Extender: Skipper Cliche in Treatment: 8 Active Problems Location of Pain Severity and Description of Pain Patient Has Paino No Site Locations Rate the pain. Current Pain Level: 0 Pain Management and Medication Current Pain Management: Electronic Signature(s) Signed: 11/10/2020 3:36:07 PM By: Jeanine Luz Signed: 11/10/2020 3:39:51 PM By: Donnamarie Poag Entered By: Jeanine Luz on 11/10/2020 11:34:08 [name] ([id number]) -------------------------------------------------------------------------------- Patient/Caregiver Education Details Patient Name: [name]. Date of Service: 11/10/2020 11:15 AM Medical Record Number: [id number] Patient Account Number: [id number] Date of Birth/Gender: 05-12-1927 (87 y.o. F) Treating RN: Donnamarie Poag Primary Care Physician: Sallee Lange Other Clinician: Referring Physician: Sallee Lange Treating Physician/Extender: Skipper Cliche in Treatment: 8 Education Assessment Education Provided  To: Patient Education Topics Provided Basic Hygiene: Wound/Skin Impairment: Electronic Signature(s) Signed: 11/10/2020 3:39:51 PM By: Donnamarie Poag Entered By: Donnamarie Poag on 11/10/2020 11:56:01 [name] ([id number]) -------------------------------------------------------------------------------- Wound Assessment Details Patient Name: [name]. Date of Service: 11/10/2020 11:15 AM Medical Record Number: [id number] Patient Account Number: [id number] Date of Birth/Sex: 12-Mar-1927 (87 y.o. F) Treating RN: Donnamarie Poag Primary Care : Sallee Lange Other Clinician: Referring : Sallee Lange Treating /Extender: Skipper Cliche in Treatment: 8 Wound Status Wound Number: 2 Primary Venous Leg Ulcer Etiology: Wound Location: Right, Medial, Anterior Lower Leg Secondary Lymphedema Wounding Event: Blister Etiology: Date Acquired: 07/08/2020 Wound Open Weeks Of Treatment: 8 Status: Clustered Wound: No Comorbid Cataracts, Anemia, Lymphedema, Arrhythmia, Congestive History: Heart Failure, Coronary Artery Disease, Hypertension, Type II Diabetes, Gout, Osteoarthritis Photos Wound Measurements Length: (cm) 2.5 Width: (cm) 0.8 Depth: (cm) 0.1 Area: (cm) 1.571 Volume: (cm) 0.157 % Reduction in Area: 96.6% % Reduction in Volume: 96.6% Epithelialization: Small (1-33%) Tunneling: No Undermining: No Wound Description Classification: Full Thickness Without Exposed Support Structures Exudate Amount: Medium Exudate Type: Serous Exudate Color: amber Foul Odor After Cleansing: No Slough/Fibrino Yes Wound Bed Granulation Amount: Medium (34-66%) Exposed Structure Granulation Quality: Pink, Pale Fascia Exposed: No Necrotic Amount: Medium (34-66%) Fat Layer (Subcutaneous Tissue) Exposed: Yes Necrotic Quality: Adherent Slough Tendon Exposed: No Muscle Exposed: No Joint Exposed:  No Bone Exposed: No Treatment Notes Wound #2 (Lower Leg) Wound  Laterality: Right, Medial, Anterior Cleanser Normal Saline Discharge Instruction: Wash your hands with soap and water. Remove old dressing, discard into plastic bag and place into trash. Cleanse the wound with Normal Saline prior to applying a clean dressing using gauze sponges, not tissues or cotton balls. Do not [name], [name]. ([id number]) scrub or use excessive force. Pat dry using gauze sponges, not tissue or cotton balls. Peri-Wound Care Topical Primary Dressing Prisma 4.34 (in) Discharge Instruction: Moisten w/normal saline or sterile water; Cover wound as directed. Do not remove from wound bed. Secondary Dressing ABD Pad 5x9 (in/in) Discharge Instruction: Cover with ABD pad Secured With Compression Wrap Profore Lite LF 3 Multilayer Compression Bandaging System Discharge Instruction: Apply 3 multi-layer wrap as prescribed. Compression Stockings Add-Ons Electronic Signature(s) Signed: 11/10/2020 3:36:07 PM By: Jeanine Luz Signed: 11/10/2020 3:39:51 PM By: Donnamarie Poag Entered By: Jeanine Luz on 11/10/2020 11:43:00 [name] ([id number]) -------------------------------------------------------------------------------- Vitals Details Patient Name: [name]. Date of Service: 11/10/2020 11:15 AM Medical Record Number: [id number] Patient Account Number: [id number] Date of Birth/Sex: 14-Jul-1926 (87 y.o. F) Treating RN: Donnamarie Poag Primary Care : Sallee Lange Other Clinician: Referring : Sallee Lange Treating /Extender: Skipper Cliche in Treatment: 8 Vital Signs Time Taken: 11:31 Temperature (F): 98.6 Height (in): 62 Pulse (bpm): 73 Weight (lbs): 166 Respiratory Rate (breaths/min): 16 Body Mass Index (BMI): 30.4 Blood Pressure (mmHg): 152/76 Reference Range: 80 - 120 mg / dl Electronic Signature(s) Signed: 11/10/2020 3:36:07 PM By: Jeanine Luz Entered By: Jeanine Luz on 11/10/2020 11:33:46

## 2020-11-10 NOTE — Progress Notes (Addendum)
JULIANN, INACIO ([id number]) Visit Report for 11/10/2020 Chief Complaint Document Details Patient Name: [name], [name]. Date of Service: 11/10/2020 11:15 AM Medical Record Number: [id number] Patient Account Number: [id number] Date of Birth/Sex: 1927/06/03 (87 y.o. F) Treating RN: Donnamarie Poag Primary Care Provider: Sallee Lange Other Clinician: Referring Provider: Sallee Lange Treating Provider/Extender: Skipper Cliche in Treatment: 8 Information Obtained from: Patient Chief Complaint Bilateral LE Ulcers Electronic Signature(s) Signed: 11/10/2020 11:25:40 AM By: Worthy Keeler PA-C Entered By: Worthy Keeler on 11/10/2020 11:25:40 [name] ([id number]) -------------------------------------------------------------------------------- Debridement Details Patient Name: [name]. Date of Service: 11/10/2020 11:15 AM Medical Record Number: [id number] Patient Account Number: [id number] Date of Birth/Sex: June 03, 1927 (87 y.o. F) Treating RN: Donnamarie Poag Primary Care Provider: Sallee Lange Other Clinician: Referring Provider: Sallee Lange Treating Provider/Extender: Skipper Cliche in Treatment: 8 Debridement Performed for Wound #2 Right,Medial,Anterior Lower Leg Assessment: Performed By: Physician Tommie Sams., PA-C Debridement Type: Debridement Severity of Tissue Pre Debridement: Fat layer exposed Level of Consciousness (Pre- Awake and Alert procedure): Pre-procedure Verification/Time Out Yes - 11:48 Taken: Start Time: 11:48 Total Area Debrided (L x W): 2.5 (cm) x 0.8 (cm) = 2 (cm) Tissue and other material Viable, Non-Viable, Slough, Subcutaneous, Skin: Epidermis, Slough debrided: Level: Skin/Subcutaneous Tissue Debridement Description: Excisional Instrument: Curette Bleeding: Minimum Hemostasis Achieved: Pressure Response to Treatment: Procedure was tolerated well Level of Consciousness (Post- Awake and Alert procedure): Post Debridement  Measurements of Total Wound Length: (cm) 2.5 Width: (cm) 0.8 Depth: (cm) 0.1 Volume: (cm) 0.157 Character of Wound/Ulcer Post Debridement: Improved Severity of Tissue Post Debridement: Fat layer exposed Post Procedure Diagnosis Same as Pre-procedure Electronic Signature(s) Signed: 11/10/2020 3:39:51 PM By: Donnamarie Poag Signed: 11/10/2020 4:00:10 PM By: Worthy Keeler PA-C Entered By: Donnamarie Poag on 11/10/2020 11:54:16 [name] ([id number]) -------------------------------------------------------------------------------- HPI Details Patient Name: [name]. Date of Service: 11/10/2020 11:15 AM Medical Record Number: [id number] Patient Account Number: [id number] Date of Birth/Sex: [birth year] (87 y.o. F) Treating RN: Donnamarie Poag Primary Care Provider: Sallee Lange Other Clinician: Referring Provider: Sallee Lange Treating Provider/Extender: Skipper Cliche in Treatment: 8 History of Present Illness HPI Description: 09/15/20 upon evaluation today patient presents for initial inspection here in our clinic concerning issues she has been having with blisters on the lower extremities bilaterally. She tells me that this began around the beginning of February. With that being said she does have a longstanding history of lymphedema she tells me. She also has chronic kidney disease stage IV which is causing some swelling as well her nephrologist did place her recently on Lasix which has helped to some degree. Her wounds actually appear to be healing which is great news. The patient does have a history of chronic venous insufficiency, lymphedema, diabetes mellitus type 2 which is controlled with diet she is not on any medications her last hemoglobin A1c was 5.6. She also has atrial fibrillation, cardiac pacemaker, and again stage IV kidney disease. 09/22/2020 upon evaluation today patient appears to be doing well with regard to her wounds. She has been tolerating the dressing  changes without complication. With that being said even in the past week she has made a dramatic improvement even though apparently the home health nurse actually placed her in an Unna boot wrap and not an actual 3 layer compression wrap. I suspect this was due to the fact that the patient did not have the medications needed to appropriately apply the dressings at home. This is fine but the nurse  did not contact us to let me know. 4/28; the patient's left leg wounds on the medial aspect are healed on the right the wound that we had is measuring smaller but our nurse reports a new area in the original blistered area. We have been using alginate 10/13/2020 on evaluation today patient's wound is actually showing signs of good improvement. Fortunately there is no evidence of active infection at this time which is great news and overall very pleased with where things stand. No fevers, chills, nausea, vomiting, or diarrhea. 10/20/2020 upon evaluation today patient is actually showing signs of good improvement in regard to her right lower leg. She has been tolerating the dressing changes without complication. Fortunately there is no evidence of infection at this time which is great and overall I am extremely pleased with where she stands. 11/04/2020 upon evaluation today patient appears to be making great progress based on what I am seeing at this time. There does not appear to be any signs of infection and overall I am extremely pleased with where things stand currently. The patient does not appear to be having any evidence or sign of infection at this time which is great news and again in general I think that her venous leg ulcer is again significantly smaller and while I am pleased with this I think that it is going somewhat slowly. Nonetheless if things stall out or do not seem to be doing as well as I would like next week we may go ahead and switch to a different dressing possibly collagen. 11/10/2020 upon  evaluation today patient appears to be doing well with regard to her wound on her leg. She has been tolerating the dressing changes without complication. Fortunately there evidence of active infection at this time. No fevers, chills, nausea, vomiting, or diarrhea. Electronic Signature(s) Signed: 11/10/2020 2:18:14 PM By: Worthy Keeler PA-C Entered By: Worthy Keeler on 11/10/2020 14:18:14 [name] ([id number]) -------------------------------------------------------------------------------- Physical Exam Details Patient Name: [name]. Date of Service: 11/10/2020 11:15 AM Medical Record Number: [id number] Patient Account Number: [id number] Date of Birth/Sex: 11-27-1926 (87 y.o. F) Treating RN: Donnamarie Poag Primary Care Provider: Sallee Lange Other Clinician: Referring Provider: Sallee Lange Treating Provider/Extender: Skipper Cliche in Treatment: 8 Constitutional Well-nourished and well-hydrated in no acute distress. Respiratory normal breathing without difficulty. Psychiatric this patient is able to make decisions and demonstrates good insight into disease process. Alert and Oriented x 3. pleasant and cooperative. Notes Patient's wound bed showed signs of good granulation epithelization at this point. There does not appear to be any evidence of active infection systemically which is great news as well and overall I am extremely pleased with where we stand at this point. Electronic Signature(s) Signed: 11/10/2020 2:18:56 PM By: Worthy Keeler PA-C Entered By: Worthy Keeler on 11/10/2020 14:18:56 [name] ([id number]) -------------------------------------------------------------------------------- Physician Orders Details Patient Name: [name]. Date of Service: 11/10/2020 11:15 AM Medical Record Number: [id number] Patient Account Number: [id number] Date of Birth/Sex: [birth year] (87 y.o. F) Treating RN: Donnamarie Poag Primary Care Provider:  Sallee Lange Other Clinician: Referring Provider: Sallee Lange Treating Provider/Extender: Skipper Cliche in Treatment: 8 Verbal / Phone Orders: No Diagnosis Coding ICD-10 Coding Code Description I89.0 Lymphedema, not elsewhere classified I87.2 Venous insufficiency (chronic) (peripheral) L97.821 Non-pressure chronic ulcer of other part of left lower leg limited to breakdown of skin L97.811 Non-pressure chronic ulcer of other part of right lower leg limited to breakdown of skin E11.622 Type 2 diabetes  mellitus with other skin ulcer I48.0 Paroxysmal atrial fibrillation Z95.0 Presence of cardiac pacemaker N18.4 Chronic kidney disease, stage 4 (severe) Follow-up Appointments o Return Appointment in 1 week. [place] for wound care. May utilize formulary equivalent dressing for wound treatment orders unless otherwise specified. Home Health Nurse may visit [facility] to address patientos wound care needs. - [facility] o Scheduled days for dressing changes to be completed; exception, patient has scheduled wound care visit that day. - 2 x a week- Monday/Thursday or Tuesday/Friday o **Please direct any NON-WOUND related issues/requests for orders to patient's Primary Care Physician. **If current dressing causes regression in wound condition, may D/C ordered dressing product/s and apply Normal Saline Moist Dressing daily until next [place] or Other MD appointment. **Notify [facility] of regression in wound condition at [phone]. Bathing/ Shower/ Hygiene o May shower with wound dressing protected with water repellent cover or cast protector. Edema Control - Lymphedema / Segmental Compressive Device / Other Right Lower Extremity o Optional: One layer of unna paste to top of compression wrap (to act as an anchor). o 3 Layer Compression System for Lymphedema. o Patient to wear own Velcro compression garment. Remove compression stockings  every night before going to bed and put on every morning when getting up. - Left extremity-Farrow wrap o Elevate, Exercise Daily and Avoid Standing for Long Periods of Time. o Elevate legs to the level of the heart and pump ankles as often as possible o Elevate leg(s) parallel to the floor when sitting. Wound Treatment Wound #2 - Lower Leg Wound Laterality: Right, Medial, Anterior Cleanser: Normal Saline (Generic) 2 x Per Week/30 Days Discharge Instructions: Wash your hands with soap and water. Remove old dressing, discard into plastic bag and place into trash. Cleanse the wound with Normal Saline prior to applying a clean dressing using gauze sponges, not tissues or cotton balls. Do not scrub or use excessive force. Pat dry using gauze sponges, not tissue or cotton balls. Primary Dressing: Prisma 4.34 (in) 2 x Per Week/30 Days Discharge Instructions: Moisten w/normal saline or sterile water; Cover wound as directed. Do not remove from wound bed. Secondary Dressing: ABD Pad 5x9 (in/in) (Generic) 2 x Per Week/30 Days Discharge Instructions: Cover with ABD pad Compression Wrap: Profore Lite LF 3 Multilayer Compression Bandaging System (Generic) 2 x Per Week/30 Days Discharge Instructions: Apply 3 multi-layer wrap as prescribed. [name], [name] ([id number]) Electronic Signature(s) Signed: 11/10/2020 3:39:51 PM By: Donnamarie Poag Signed: 11/10/2020 4:00:10 PM By: Worthy Keeler PA-C Entered By: Donnamarie Poag on 11/10/2020 11:55:24 [name], [name] ([id number]) -------------------------------------------------------------------------------- Problem List Details Patient Name: [name], [name]. Date of Service: 11/10/2020 11:15 AM Medical Record Number: [id number] Patient Account Number: [id number] Date of Birth/Sex: August 12, 1926 (87 y.o. F) Treating RN: Donnamarie Poag Primary Care Provider: Sallee Lange Other Clinician: Referring Provider: Sallee Lange Treating Provider/Extender: Skipper Cliche in Treatment: 8 Active Problems ICD-10 Encounter Code Description Active Date MDM Diagnosis I89.0 Lymphedema, not elsewhere classified 09/15/2020 No Yes I87.2 Venous insufficiency (chronic) (peripheral) 09/15/2020 No Yes L97.821 Non-pressure chronic ulcer of other part of left lower leg limited to 09/15/2020 No Yes breakdown of skin L97.811 Non-pressure chronic ulcer of other part of right lower leg limited to 09/15/2020 No Yes breakdown of skin E11.622 Type 2 diabetes mellitus with other skin ulcer 09/15/2020 No Yes I48.0 Paroxysmal atrial fibrillation 09/15/2020 No Yes Z95.0 Presence of cardiac pacemaker 09/15/2020 No Yes N18.4 Chronic kidney disease, stage 4 (severe) 09/15/2020 No  Yes Inactive Problems Resolved Problems Electronic Signature(s) Signed: 11/10/2020 11:25:33 AM By: Worthy Keeler PA-C Entered By: Worthy Keeler on 11/10/2020 11:25:32 [name], [name] ([id number]) -------------------------------------------------------------------------------- Progress Note Details Patient Name: [name]. Date of Service: 11/10/2020 11:15 AM Medical Record Number: [id number] Patient Account Number: [id number] Date of Birth/Sex: 04-13-1927 (87 y.o. F) Treating RN: Donnamarie Poag Primary Care Provider: Sallee Lange Other Clinician: Referring Provider: Sallee Lange Treating Provider/Extender: Skipper Cliche in Treatment: 8 Subjective Chief Complaint Information obtained from Patient Bilateral LE Ulcers History of Present Illness (HPI) 09/15/20 upon evaluation today patient presents for initial inspection here in our clinic concerning issues she has been having with blisters on the lower extremities bilaterally. She tells me that this began around the beginning of February. With that being said she does have a longstanding history of lymphedema she tells me. She also has chronic kidney disease stage IV which is causing some swelling as well her nephrologist did place  her recently on Lasix which has helped to some degree. Her wounds actually appear to be healing which is great news. The patient does have a history of chronic venous insufficiency, lymphedema, diabetes mellitus type 2 which is controlled with diet she is not on any medications her last hemoglobin A1c was 5.6. She also has atrial fibrillation, cardiac pacemaker, and again stage IV kidney disease. 09/22/2020 upon evaluation today patient appears to be doing well with regard to her wounds. She has been tolerating the dressing changes without complication. With that being said even in the past week she has made a dramatic improvement even though apparently the home health nurse actually placed her in an Unna boot wrap and not an actual 3 layer compression wrap. I suspect this was due to the fact that the patient did not have the medications needed to appropriately apply the dressings at home. This is fine but the nurse did not contact us to let me know. 4/28; the patient's left leg wounds on the medial aspect are healed on the right the wound that we had is measuring smaller but our nurse reports a new area in the original blistered area. We have been using alginate 10/13/2020 on evaluation today patient's wound is actually showing signs of good improvement. Fortunately there is no evidence of active infection at this time which is great news and overall very pleased with where things stand. No fevers, chills, nausea, vomiting, or diarrhea. 10/20/2020 upon evaluation today patient is actually showing signs of good improvement in regard to her right lower leg. She has been tolerating the dressing changes without complication. Fortunately there is no evidence of infection at this time which is great and overall I am extremely pleased with where she stands. 11/04/2020 upon evaluation today patient appears to be making great progress based on what I am seeing at this time. There does not appear to be any signs of  infection and overall I am extremely pleased with where things stand currently. The patient does not appear to be having any evidence or sign of infection at this time which is great news and again in general I think that her venous leg ulcer is again significantly smaller and while I am pleased with this I think that it is going somewhat slowly. Nonetheless if things stall out or do not seem to be doing as well as I would like next week we may go ahead and switch to a different dressing possibly collagen. 11/10/2020 upon evaluation today patient appears to  be doing well with regard to her wound on her leg. She has been tolerating the dressing changes without complication. Fortunately there evidence of active infection at this time. No fevers, chills, nausea, vomiting, or diarrhea. Objective Constitutional Well-nourished and well-hydrated in no acute distress. Vitals Time Taken: 11:31 AM, Height: 62 in, Weight: 166 lbs, BMI: 30.4, Temperature: 98.6 F, Pulse: 73 bpm, Respiratory Rate: 16 breaths/min, Blood Pressure: 152/76 mmHg. Respiratory normal breathing without difficulty. Psychiatric this patient is able to make decisions and demonstrates good insight into disease process. Alert and Oriented x 3. pleasant and cooperative. General Notes: Patient's wound bed showed signs of good granulation epithelization at this point. There does not appear to be any evidence of active infection systemically which is great news as well and overall I am extremely pleased with where we stand at this point. Integumentary (Hair, Skin) [name], [name]. ([id number]) Wound #2 status is Open. Original cause of wound was Blister. The date acquired was: 07/08/2020. The wound has been in treatment 8 weeks. The wound is located on the Right,Medial,Anterior Lower Leg. The wound measures 2.5cm length x 0.8cm width x 0.1cm depth; 1.571cm^2 area and 0.157cm^3 volume. There is Fat Layer (Subcutaneous Tissue) exposed. There is  no tunneling or undermining noted. There is a medium amount of serous drainage noted. There is medium (34-66%) pink, pale granulation within the wound bed. There is a medium (34-66%) amount of necrotic tissue within the wound bed including Adherent Slough. Assessment Active Problems ICD-10 Lymphedema, not elsewhere classified Venous insufficiency (chronic) (peripheral) Non-pressure chronic ulcer of other part of left lower leg limited to breakdown of skin Non-pressure chronic ulcer of other part of right lower leg limited to breakdown of skin Type 2 diabetes mellitus with other skin ulcer Paroxysmal atrial fibrillation Presence of cardiac pacemaker Chronic kidney disease, stage 4 (severe) Procedures Wound #2 Pre-procedure diagnosis of Wound #2 is a Venous Leg Ulcer located on the Right,Medial,Anterior Lower Leg .Severity of Tissue Pre Debridement is: Fat layer exposed. There was a Excisional Skin/Subcutaneous Tissue Debridement with a total area of 2 sq cm performed by Tommie Sams., PA-C. With the following instrument(s): Curette to remove Viable and Non-Viable tissue/material. Material removed includes Subcutaneous Tissue, Slough, and Skin: Epidermis. A time out was conducted at 11:48, prior to the start of the procedure. A Minimum amount of bleeding was controlled with Pressure. The procedure was tolerated well. Post Debridement Measurements: 2.5cm length x 0.8cm width x 0.1cm depth; 0.157cm^3 volume. Character of Wound/Ulcer Post Debridement is improved. Severity of Tissue Post Debridement is: Fat layer exposed. Post procedure Diagnosis Wound #2: Same as Pre-Procedure Plan Follow-up Appointments: Return Appointment in 1 week. Home Health: [facility] for wound care. May utilize formulary equivalent dressing for wound treatment orders unless otherwise specified. Home Health Nurse may visit [facility] to address patient s wound care needs. - [facility] Scheduled days for dressing  changes to be completed; exception, patient has scheduled wound care visit that day. - 2 x a week- Monday/Thursday or Tuesday/Friday **Please direct any NON-WOUND related issues/requests for orders to patient's Primary Care Physician. **If current dressing causes regression in wound condition, may D/C ordered dressing product/s and apply Normal Saline Moist Dressing daily until next [place] or Other MD appointment. **Notify [facility] of regression in wound condition at [phone]. Bathing/ Shower/ Hygiene: May shower with wound dressing protected with water repellent cover or cast protector. Edema Control - Lymphedema / Segmental Compressive Device / Other: Optional: One layer  of unna paste to top of compression wrap (to act as an anchor). 3 Layer Compression System for Lymphedema. Patient to wear own Velcro compression garment. Remove compression stockings every night before going to bed and put on every morning when getting up. - Left extremity-Farrow wrap Elevate, Exercise Daily and Avoid Standing for Long Periods of Time. Elevate legs to the level of the heart and pump ankles as often as possible Elevate leg(s) parallel to the floor when sitting. WOUND #2: - Lower Leg Wound Laterality: Right, Medial, Anterior Cleanser: Normal Saline (Generic) 2 x Per Week/30 Days Discharge Instructions: Wash your hands with soap and water. Remove old dressing, discard into plastic bag and place into trash. Cleanse the wound with Normal Saline prior to applying a clean dressing using gauze sponges, not tissues or cotton balls. Do not scrub or use excessive force. Pat dry using gauze sponges, not tissue or cotton balls. Primary Dressing: Prisma 4.34 (in) 2 x Per Week/30 Days Discharge Instructions: Moisten w/normal saline or sterile water; Cover wound as directed. Do not remove from wound bed. Secondary Dressing: ABD Pad 5x9 (in/in) (Generic) 2 x Per Week/30 Days [name], [name]. ([id number]) Discharge Instructions: Cover with ABD pad Compression Wrap: Profore Lite LF 3 Multilayer Compression Bandaging System (Generic) 2 x Per Week/30 Days Discharge Instructions: Apply 3 multi-layer wrap as prescribed. 1. Would recommend that we going continue with the wound care measures as before and the patient is in agreement with the plan. This includes the use of the silver collagen dressing which will be a change for [place] today away from the [facility] to try to see what we can do about improving the overall status of the wounds. She is in agreement with that plan. 2. Would recommend as well that we have the patient go ahead and continue with the 3 layer compression wrap as well which I think is doing a great job. We will see patient back for reevaluation in 1 week here in the clinic. If anything worsens or changes patient will contact our office for additional recommendations. Electronic Signature(s) Signed: 11/10/2020 2:19:39 PM By: Worthy Keeler PA-C Entered By: Worthy Keeler on 11/10/2020 14:19:38 [name] ([id number]) -------------------------------------------------------------------------------- SuperBill Details Patient Name: [name]. Date of Service: 11/10/2020 Medical Record Number: [id number] Patient Account Number: [id number] Date of Birth/Sex: 03/01/1927 (87 y.o. F) Treating RN: Donnamarie Poag Primary Care Provider: Sallee Lange Other Clinician: Referring Provider: Sallee Lange Treating Provider/Extender: Skipper Cliche in Treatment: 8 Diagnosis Coding ICD-10 Codes Code Description I89.0 Lymphedema, not elsewhere classified I87.2 Venous insufficiency (chronic) (peripheral) L97.821 Non-pressure chronic ulcer of other part of left lower leg limited to breakdown of skin L97.811 Non-pressure chronic ulcer of other part of right lower leg limited to breakdown of skin E11.622 Type 2 diabetes mellitus with other skin ulcer I48.0  Paroxysmal atrial fibrillation Z95.0 Presence of cardiac pacemaker N18.4 Chronic kidney disease, stage 4 (severe) Facility Procedures CPT4 Code: [id number] Description: 11042 - DEB SUBQ TISSUE 20 SQ CM/< Modifier: Quantity: 1 CPT4 Code: Description: ICD-10 Diagnosis Description L97.811 Non-pressure chronic ulcer of other part of right lower leg limited to br Modifier: eakdown of skin Quantity: Physician Procedures CPT4 [id number] Description: 11042 - WC PHYS SUBQ TISS 20 SQ CM Modifier: Quantity: 1 CPT4 Code: Description: ICD-10 Diagnosis Description L97.811 Non-pressure chronic ulcer of other part of right lower leg limited to br Modifier: eakdown of skin Quantity: Electronic Signature(s) Signed: 11/10/2020 2:23:42 PM By: Worthy Keeler PA-C  Entered By: Worthy Keeler on 11/10/2020 14:23:42

## 2020-11-11 DIAGNOSIS — D638 Anemia in other chronic diseases classified elsewhere: Secondary | ICD-10-CM | POA: Diagnosis not present

## 2020-11-11 DIAGNOSIS — I129 Hypertensive chronic kidney disease with stage 1 through stage 4 chronic kidney disease, or unspecified chronic kidney disease: Secondary | ICD-10-CM | POA: Diagnosis not present

## 2020-11-11 DIAGNOSIS — N170 Acute kidney failure with tubular necrosis: Secondary | ICD-10-CM | POA: Diagnosis not present

## 2020-11-11 DIAGNOSIS — N184 Chronic kidney disease, stage 4 (severe): Secondary | ICD-10-CM | POA: Diagnosis not present

## 2020-11-11 DIAGNOSIS — E875 Hyperkalemia: Secondary | ICD-10-CM | POA: Diagnosis not present

## 2020-11-11 DIAGNOSIS — R809 Proteinuria, unspecified: Secondary | ICD-10-CM | POA: Diagnosis not present

## 2020-11-11 NOTE — Telephone Encounter (Signed)
Patient daughter calls back in regards to her mother and states she is better , and has no concerns at this time.

## 2020-11-11 NOTE — Telephone Encounter (Signed)
Left message to return call 

## 2020-11-12 DIAGNOSIS — D638 Anemia in other chronic diseases classified elsewhere: Secondary | ICD-10-CM | POA: Diagnosis not present

## 2020-11-12 DIAGNOSIS — R809 Proteinuria, unspecified: Secondary | ICD-10-CM | POA: Diagnosis not present

## 2020-11-12 DIAGNOSIS — E875 Hyperkalemia: Secondary | ICD-10-CM | POA: Diagnosis not present

## 2020-11-12 DIAGNOSIS — N184 Chronic kidney disease, stage 4 (severe): Secondary | ICD-10-CM | POA: Diagnosis not present

## 2020-11-12 DIAGNOSIS — I129 Hypertensive chronic kidney disease with stage 1 through stage 4 chronic kidney disease, or unspecified chronic kidney disease: Secondary | ICD-10-CM | POA: Diagnosis not present

## 2020-11-13 ENCOUNTER — Ambulatory Visit (INDEPENDENT_AMBULATORY_CARE_PROVIDER_SITE_OTHER): Payer: Medicare HMO | Admitting: *Deleted

## 2020-11-13 DIAGNOSIS — I509 Heart failure, unspecified: Secondary | ICD-10-CM | POA: Diagnosis not present

## 2020-11-13 DIAGNOSIS — E11622 Type 2 diabetes mellitus with other skin ulcer: Secondary | ICD-10-CM | POA: Diagnosis not present

## 2020-11-13 DIAGNOSIS — I872 Venous insufficiency (chronic) (peripheral): Secondary | ICD-10-CM | POA: Diagnosis not present

## 2020-11-13 DIAGNOSIS — I4891 Unspecified atrial fibrillation: Secondary | ICD-10-CM | POA: Diagnosis not present

## 2020-11-13 DIAGNOSIS — I130 Hypertensive heart and chronic kidney disease with heart failure and stage 1 through stage 4 chronic kidney disease, or unspecified chronic kidney disease: Secondary | ICD-10-CM | POA: Diagnosis not present

## 2020-11-13 DIAGNOSIS — Z4800 Encounter for change or removal of nonsurgical wound dressing: Secondary | ICD-10-CM | POA: Diagnosis not present

## 2020-11-13 DIAGNOSIS — Z5181 Encounter for therapeutic drug level monitoring: Secondary | ICD-10-CM

## 2020-11-13 DIAGNOSIS — L97812 Non-pressure chronic ulcer of other part of right lower leg with fat layer exposed: Secondary | ICD-10-CM | POA: Diagnosis not present

## 2020-11-13 DIAGNOSIS — E1151 Type 2 diabetes mellitus with diabetic peripheral angiopathy without gangrene: Secondary | ICD-10-CM | POA: Diagnosis not present

## 2020-11-13 DIAGNOSIS — L97822 Non-pressure chronic ulcer of other part of left lower leg with fat layer exposed: Secondary | ICD-10-CM | POA: Diagnosis not present

## 2020-11-13 DIAGNOSIS — I890 Lymphedema, not elsewhere classified: Secondary | ICD-10-CM | POA: Diagnosis not present

## 2020-11-13 DIAGNOSIS — I639 Cerebral infarction, unspecified: Secondary | ICD-10-CM

## 2020-11-13 LAB — POCT INR: INR: 1.4 — AB (ref 2.0–3.0)

## 2020-11-13 NOTE — Patient Instructions (Signed)
Has been drinking 2 boost a day.  Is going to cut back to 1/2 can/day Take warfarin 1 tablet tonight then increase dose to 1/2 tablet daily except 1 tablet on Sundays  (This is what pt has been taking since I saw her last) Recheck in 3 weeks

## 2020-11-17 ENCOUNTER — Other Ambulatory Visit: Payer: Self-pay

## 2020-11-17 ENCOUNTER — Encounter: Payer: Medicare HMO | Admitting: Physician Assistant

## 2020-11-17 DIAGNOSIS — E1122 Type 2 diabetes mellitus with diabetic chronic kidney disease: Secondary | ICD-10-CM | POA: Diagnosis not present

## 2020-11-17 DIAGNOSIS — I872 Venous insufficiency (chronic) (peripheral): Secondary | ICD-10-CM | POA: Diagnosis not present

## 2020-11-17 DIAGNOSIS — I480 Paroxysmal atrial fibrillation: Secondary | ICD-10-CM | POA: Diagnosis not present

## 2020-11-17 DIAGNOSIS — L97812 Non-pressure chronic ulcer of other part of right lower leg with fat layer exposed: Secondary | ICD-10-CM | POA: Diagnosis not present

## 2020-11-17 DIAGNOSIS — L97821 Non-pressure chronic ulcer of other part of left lower leg limited to breakdown of skin: Secondary | ICD-10-CM | POA: Diagnosis not present

## 2020-11-17 DIAGNOSIS — Z950 Presence of cardiac pacemaker: Secondary | ICD-10-CM | POA: Diagnosis not present

## 2020-11-17 DIAGNOSIS — N184 Chronic kidney disease, stage 4 (severe): Secondary | ICD-10-CM | POA: Diagnosis not present

## 2020-11-17 DIAGNOSIS — L97811 Non-pressure chronic ulcer of other part of right lower leg limited to breakdown of skin: Secondary | ICD-10-CM | POA: Diagnosis not present

## 2020-11-17 DIAGNOSIS — E11622 Type 2 diabetes mellitus with other skin ulcer: Secondary | ICD-10-CM | POA: Diagnosis not present

## 2020-11-17 DIAGNOSIS — I890 Lymphedema, not elsewhere classified: Secondary | ICD-10-CM | POA: Diagnosis not present

## 2020-11-17 NOTE — Progress Notes (Signed)
SYDNEY, MINOT ([id number]) Visit Report for 11/17/2020 Chief Complaint Document Details Patient Name: [name], [name]. Date of Service: 11/17/2020 11:15 AM Medical Record Number: [id number] Patient Account Number: [id number] Date of Birth/Sex: Feb 11, 1927 (87 y.o. F) Treating RN: Donnamarie Poag Primary Care Provider: Sallee Lange Other Clinician: Referring Provider: Sallee Lange Treating Provider/Extender: Skipper Cliche in Treatment: 9 Information Obtained from: Patient Chief Complaint Bilateral LE Ulcers Electronic Signature(s) Signed: 11/17/2020 12:08:11 PM By: Worthy Keeler PA-C Entered By: Worthy Keeler on 11/17/2020 12:08:11 [name] ([id number]) -------------------------------------------------------------------------------- HPI Details Patient Name: [name]. Date of Service: 11/17/2020 11:15 AM Medical Record Number: [id number] Patient Account Number: [id number] Date of Birth/Sex: 1926/08/30 (87 y.o. F) Treating RN: Donnamarie Poag Primary Care Provider: Sallee Lange Other Clinician: Referring Provider: Sallee Lange Treating Provider/Extender: Skipper Cliche in Treatment: 9 History of Present Illness HPI Description: 09/15/20 upon evaluation today patient presents for initial inspection here in our clinic concerning issues she has been having with blisters on the lower extremities bilaterally. She tells me that this began around the beginning of February. With that being said she does have a longstanding history of lymphedema she tells me. She also has chronic kidney disease stage IV which is causing some swelling as well her nephrologist did place her recently on Lasix which has helped to some degree. Her wounds actually appear to be healing which is great news. The patient does have a history of chronic venous insufficiency, lymphedema, diabetes mellitus type 2 which is controlled with diet she is not on any medications her last hemoglobin A1c  was 5.6. She also has atrial fibrillation, cardiac pacemaker, and again stage IV kidney disease. 09/22/2020 upon evaluation today patient appears to be doing well with regard to her wounds. She has been tolerating the dressing changes without complication. With that being said even in the past week she has made a dramatic improvement even though apparently the home health nurse actually placed her in an Unna boot wrap and not an actual 3 layer compression wrap. I suspect this was due to the fact that the patient did not have the medications needed to appropriately apply the dressings at home. This is fine but the nurse did not contact us to let me know. 4/28; the patient's left leg wounds on the medial aspect are healed on the right the wound that we had is measuring smaller but our nurse reports a new area in the original blistered area. We have been using alginate 10/13/2020 on evaluation today patient's wound is actually showing signs of good improvement. Fortunately there is no evidence of active infection at this time which is great news and overall very pleased with where things stand. No fevers, chills, nausea, vomiting, or diarrhea. 10/20/2020 upon evaluation today patient is actually showing signs of good improvement in regard to her right lower leg. She has been tolerating the dressing changes without complication. Fortunately there is no evidence of infection at this time which is great and overall I am extremely pleased with where she stands. 11/04/2020 upon evaluation today patient appears to be making great progress based on what I am seeing at this time. There does not appear to be any signs of infection and overall I am extremely pleased with where things stand currently. The patient does not appear to be having any evidence or sign of infection at this time which is great news and again in general I think that her venous leg ulcer is again significantly smaller  and while I am pleased with  this I think that it is going somewhat slowly. Nonetheless if things stall out or do not seem to be doing as well as I would like next week we may go ahead and switch to a different dressing possibly collagen. 11/10/2020 upon evaluation today patient appears to be doing well with regard to her wound on her leg. She has been tolerating the dressing changes without complication. Fortunately there evidence of active infection at this time. No fevers, chills, nausea, vomiting, or diarrhea. 11/17/2020 upon evaluation today patient appears to be doing well with regard to her wound. She has been tolerating the dressing changes without complication. Fortunately there is no signs of active infection at this time. No fevers, chills, nausea, vomiting, or diarrhea. Electronic Signature(s) Signed: 11/17/2020 1:06:50 PM By: Worthy Keeler PA-C Entered By: Worthy Keeler on 11/17/2020 13:06:50 [name] ([id number]) -------------------------------------------------------------------------------- Physical Exam Details Patient Name: [name]. Date of Service: 11/17/2020 11:15 AM Medical Record Number: [id number] Patient Account Number: [id number] Date of Birth/Sex: 16-Dec-1926 (87 y.o. F) Treating RN: Donnamarie Poag Primary Care Provider: Sallee Lange Other Clinician: Referring Provider: Sallee Lange Treating Provider/Extender: Skipper Cliche in Treatment: 9 Constitutional Well-nourished and well-hydrated in no acute distress. Respiratory normal breathing without difficulty. Psychiatric this patient is able to make decisions and demonstrates good insight into disease process. Alert and Oriented x 3. pleasant and cooperative. Notes Upon inspection patient's wound bed showed signs of good granulation epithelization at this point. I do not see any evidence of active infection at this time which is great news and overall I feel like that the patient is making good progress. In general I feel  like that she is headed in the right direction. I do believe that the switch to silver collagen was a good when it seems like that she did make some progress even compared to the last week's measurement. Electronic Signature(s) Signed: 11/17/2020 1:07:08 PM By: Worthy Keeler PA-C Entered By: Worthy Keeler on 11/17/2020 13:07:08 [name] ([id number]) -------------------------------------------------------------------------------- Physician Orders Details Patient Name: [name]. Date of Service: 11/17/2020 11:15 AM Medical Record Number: [id number] Patient Account Number: [id number] Date of Birth/Sex: [birth year] (87 y.o. F) Treating RN: Donnamarie Poag Primary Care Provider: Sallee Lange Other Clinician: Referring Provider: Sallee Lange Treating Provider/Extender: Skipper Cliche in Treatment: 9 Verbal / Phone Orders: No Diagnosis Coding Follow-up Appointments o Return Appointment in 1 week. [place] for wound care. May utilize formulary equivalent dressing for wound treatment orders unless otherwise specified. Home Health Nurse may visit [facility] to address patientos wound care needs. - [facility] o Scheduled days for dressing changes to be completed; exception, patient has scheduled wound care visit that day. - 2 x a week- Monday/Thursday or Tuesday/Friday o **Please direct any NON-WOUND related issues/requests for orders to patient's Primary Care Physician. **If current dressing causes regression in wound condition, may D/C ordered dressing product/s and apply Normal Saline Moist Dressing daily until next [place] or Other MD appointment. **Notify [facility] of regression in wound condition at [phone]. Bathing/ Shower/ Hygiene o May shower with wound dressing protected with water repellent cover or cast protector. Edema Control - Lymphedema / Segmental Compressive Device / Other Right Lower Extremity o  Optional: One layer of unna paste to top of compression wrap (to act as an anchor). o 3 Layer Compression System for Lymphedema. o Patient to wear own Velcro compression garment.  Remove compression stockings every night before going to bed and put on every morning when getting up. - Left extremity-Farrow wrap o Elevate, Exercise Daily and Avoid Standing for Long Periods of Time. o Elevate legs to the level of the heart and pump ankles as often as possible o Elevate leg(s) parallel to the floor when sitting. Wound Treatment Wound #2 - Lower Leg Wound Laterality: Right, Medial, Anterior Cleanser: Normal Saline (Generic) 2 x Per Week/30 Days Discharge Instructions: Wash your hands with soap and water. Remove old dressing, discard into plastic bag and place into trash. Cleanse the wound with Normal Saline prior to applying a clean dressing using gauze sponges, not tissues or cotton balls. Do not scrub or use excessive force. Pat dry using gauze sponges, not tissue or cotton balls. Primary Dressing: Prisma 4.34 (in) 2 x Per Week/30 Days Discharge Instructions: Moisten w/normal saline or sterile water; Cover wound as directed. Do not remove from wound bed. Secondary Dressing: ABD Pad 5x9 (in/in) (Generic) 2 x Per Week/30 Days Discharge Instructions: Cover with ABD pad Compression Wrap: Profore Lite LF 3 Multilayer Compression Bandaging System (Generic) 2 x Per Week/30 Days Discharge Instructions: Apply 3 multi-layer wrap as prescribed. Electronic Signature(s) Signed: 11/17/2020 1:05:09 PM By: Donnamarie Poag Signed: 11/17/2020 5:46:17 PM By: Worthy Keeler PA-C Entered By: Donnamarie Poag on 11/17/2020 11:48:44 [name] ([id number]) -------------------------------------------------------------------------------- Problem List Details Patient Name: [name], [name]. Date of Service: 11/17/2020 11:15 AM Medical Record Number: [id number] Patient Account Number: [id number] Date of  Birth/Sex: [birth year] (87 y.o. F) Treating RN: Donnamarie Poag Primary Care Provider: Sallee Lange Other Clinician: Referring Provider: Sallee Lange Treating Provider/Extender: Skipper Cliche in Treatment: 9 Active Problems ICD-10 Encounter Code Description Active Date MDM Diagnosis I89.0 Lymphedema, not elsewhere classified 09/15/2020 No Yes I87.2 Venous insufficiency (chronic) (peripheral) 09/15/2020 No Yes L97.821 Non-pressure chronic ulcer of other part of left lower leg limited to 09/15/2020 No Yes breakdown of skin L97.811 Non-pressure chronic ulcer of other part of right lower leg limited to 09/15/2020 No Yes breakdown of skin E11.622 Type 2 diabetes mellitus with other skin ulcer 09/15/2020 No Yes I48.0 Paroxysmal atrial fibrillation 09/15/2020 No Yes Z95.0 Presence of cardiac pacemaker 09/15/2020 No Yes N18.4 Chronic kidney disease, stage 4 (severe) 09/15/2020 No Yes Inactive Problems Resolved Problems Electronic Signature(s) Signed: 11/17/2020 12:08:06 PM By: Worthy Keeler PA-C Entered By: Worthy Keeler on 11/17/2020 12:08:06 [name] ([id number]) -------------------------------------------------------------------------------- Progress Note Details Patient Name: [name]. Date of Service: 11/17/2020 11:15 AM Medical Record Number: [id number] Patient Account Number: [id number] Date of Birth/Sex: [birth year] (87 y.o. F) Treating RN: Donnamarie Poag Primary Care Provider: Sallee Lange Other Clinician: Referring Provider: Sallee Lange Treating Provider/Extender: Skipper Cliche in Treatment: 9 Subjective Chief Complaint Information obtained from Patient Bilateral LE Ulcers History of Present Illness (HPI) 09/15/20 upon evaluation today patient presents for initial inspection here in our clinic concerning issues she has been having with blisters on the lower extremities bilaterally. She tells me that this began around the beginning of February. With that  being said she does have a longstanding history of lymphedema she tells me. She also has chronic kidney disease stage IV which is causing some swelling as well her nephrologist did place her recently on Lasix which has helped to some degree. Her wounds actually appear to be healing which is great news. The patient does have a history of chronic venous insufficiency, lymphedema, diabetes mellitus type 2 which is controlled with diet she is  not on any medications her last hemoglobin A1c was 5.6. She also has atrial fibrillation, cardiac pacemaker, and again stage IV kidney disease. 09/22/2020 upon evaluation today patient appears to be doing well with regard to her wounds. She has been tolerating the dressing changes without complication. With that being said even in the past week she has made a dramatic improvement even though apparently the home health nurse actually placed her in an Unna boot wrap and not an actual 3 layer compression wrap. I suspect this was due to the fact that the patient did not have the medications needed to appropriately apply the dressings at home. This is fine but the nurse did not contact us to let me know. 4/28; the patient's left leg wounds on the medial aspect are healed on the right the wound that we had is measuring smaller but our nurse reports a new area in the original blistered area. We have been using alginate 10/13/2020 on evaluation today patient's wound is actually showing signs of good improvement. Fortunately there is no evidence of active infection at this time which is great news and overall very pleased with where things stand. No fevers, chills, nausea, vomiting, or diarrhea. 10/20/2020 upon evaluation today patient is actually showing signs of good improvement in regard to her right lower leg. She has been tolerating the dressing changes without complication. Fortunately there is no evidence of infection at this time which is great and overall I am  extremely pleased with where she stands. 11/04/2020 upon evaluation today patient appears to be making great progress based on what I am seeing at this time. There does not appear to be any signs of infection and overall I am extremely pleased with where things stand currently. The patient does not appear to be having any evidence or sign of infection at this time which is great news and again in general I think that her venous leg ulcer is again significantly smaller and while I am pleased with this I think that it is going somewhat slowly. Nonetheless if things stall out or do not seem to be doing as well as I would like next week we may go ahead and switch to a different dressing possibly collagen. 11/10/2020 upon evaluation today patient appears to be doing well with regard to her wound on her leg. She has been tolerating the dressing changes without complication. Fortunately there evidence of active infection at this time. No fevers, chills, nausea, vomiting, or diarrhea. 11/17/2020 upon evaluation today patient appears to be doing well with regard to her wound. She has been tolerating the dressing changes without complication. Fortunately there is no signs of active infection at this time. No fevers, chills, nausea, vomiting, or diarrhea. Objective Constitutional Well-nourished and well-hydrated in no acute distress. Vitals Time Taken: 11:20 AM, Height: 62 in, Weight: 166 lbs, BMI: 30.4, Temperature: 98.4 F, Pulse: 84 bpm, Respiratory Rate: 18 breaths/min, Blood Pressure: 158/56 mmHg. Respiratory normal breathing without difficulty. Psychiatric this patient is able to make decisions and demonstrates good insight into disease process. Alert and Oriented x 3. pleasant and cooperative. General Notes: Upon inspection patient's wound bed showed signs of good granulation epithelization at this point. I do not see any evidence of [name], [name]. ([id number]) active infection at this time which  is great news and overall I feel like that the patient is making good progress. In general I feel like that she is headed in the right direction. I do believe that the switch to  silver collagen was a good when it seems like that she did make some progress even compared to the last week's measurement. Integumentary (Hair, Skin) Wound #2 status is Open. Original cause of wound was Blister. The date acquired was: 07/08/2020. The wound has been in treatment 9 weeks. The wound is located on the Right,Medial,Anterior Lower Leg. The wound measures 2.2cm length x 0.8cm width x 0.1cm depth; 1.382cm^2 area and 0.138cm^3 volume. There is Fat Layer (Subcutaneous Tissue) exposed. There is no tunneling or undermining noted. There is a medium amount of serous drainage noted. There is medium (34-66%) pink, pale granulation within the wound bed. There is a medium (34-66%) amount of necrotic tissue within the wound bed including Adherent Slough. Assessment Active Problems ICD-10 Lymphedema, not elsewhere classified Venous insufficiency (chronic) (peripheral) Non-pressure chronic ulcer of other part of left lower leg limited to breakdown of skin Non-pressure chronic ulcer of other part of right lower leg limited to breakdown of skin Type 2 diabetes mellitus with other skin ulcer Paroxysmal atrial fibrillation Presence of cardiac pacemaker Chronic kidney disease, stage 4 (severe) Procedures Wound #2 Pre-procedure diagnosis of Wound #2 is a Venous Leg Ulcer located on the Right,Medial,Anterior Lower Leg . There was a Three Layer Compression Therapy Procedure by Donnamarie Poag, RN. Post procedure Diagnosis Wound #2: Same as Pre-Procedure Plan Follow-up Appointments: Return Appointment in 1 week. [facility] Health: [facility] for [facility]. May utilize formulary equivalent dressing for wound treatment orders unless otherwise specified. Home Health Nurse may visit [facility] to address patient s wound care needs. -  [facility] Scheduled days for dressing changes to be completed; exception, patient has scheduled wound care visit that day. - 2 x a week- Monday/Thursday or Tuesday/Friday **Please direct any NON-WOUND related issues/requests for orders to patient's Primary Care Physician. **If current dressing causes regression in wound condition, may D/C ordered dressing product/s and apply Normal Saline Moist Dressing daily until next [place] or Other MD appointment. **Notify [facility] of regression in wound condition at [phone]. Bathing/ Shower/ Hygiene: May shower with wound dressing protected with water repellent cover or cast protector. Edema Control - Lymphedema / Segmental Compressive Device / Other: Optional: One layer of unna paste to top of compression wrap (to act as an anchor). 3 Layer Compression System for Lymphedema. Patient to wear own Velcro compression garment. Remove compression stockings every night before going to bed and put on every morning when getting up. - Left extremity-Farrow wrap Elevate, Exercise Daily and Avoid Standing for Long Periods of Time. Elevate legs to the level of the heart and pump ankles as often as possible Elevate leg(s) parallel to the floor when sitting. WOUND #2: - Lower Leg Wound Laterality: Right, Medial, Anterior Cleanser: Normal Saline (Generic) 2 x Per Week/30 Days Discharge Instructions: Wash your hands with soap and water. Remove old dressing, discard into plastic bag and place into trash. Cleanse the wound with Normal Saline prior to applying a clean dressing using gauze sponges, not tissues or cotton balls. Do not scrub or use excessive force. Pat dry using gauze sponges, not tissue or cotton balls. Primary Dressing: Prisma 4.34 (in) 2 x Per Week/30 Days Discharge Instructions: Moisten w/normal saline or sterile water; Cover wound as directed. Do not remove from wound bed. Secondary Dressing: ABD Pad 5x9 (in/in) (Generic) 2 x  Per Week/30 Days [name], [name]. ([id number]) Discharge Instructions: Cover with ABD pad Compression Wrap: Profore Lite LF 3 Multilayer Compression Bandaging System (Generic) 2 x Per  Week/30 Days Discharge Instructions: Apply 3 multi-layer wrap as prescribed. 1. Would recommend currently that we continue with the silver collagen I think this is still a good way to go. 2. I am also can recommend that we have the patient continue to monitor for any signs of worsening infection specifically such as increased pain since she is in the wrap since she cannot obviously see the wound. 3. I am also can recommend the patient continue to elevate her leg as much as possible try to keep edema under good control. We will see patient back for reevaluation in 1 week here in the clinic. If anything worsens or changes patient will contact our office for additional recommendations. Electronic Signature(s) Signed: 11/17/2020 1:07:50 PM By: Worthy Keeler PA-C Entered By: Worthy Keeler on 11/17/2020 13:07:50 [name] ([id number]) -------------------------------------------------------------------------------- SuperBill Details Patient Name: [name]. Date of Service: 11/17/2020 Medical Record Number: [id number] Patient Account Number: [id number] Date of Birth/Sex: August 31, 1926 (87 y.o. F) Treating RN: Donnamarie Poag Primary Care Provider: Sallee Lange Other Clinician: Referring Provider: Sallee Lange Treating Provider/Extender: Skipper Cliche in Treatment: 9 Diagnosis Coding ICD-10 Codes Code Description I89.0 Lymphedema, not elsewhere classified I87.2 Venous insufficiency (chronic) (peripheral) L97.821 Non-pressure chronic ulcer of other part of left lower leg limited to breakdown of skin L97.811 Non-pressure chronic ulcer of other part of right lower leg limited to breakdown of skin E11.622 Type 2 diabetes mellitus with other skin ulcer I48.0 Paroxysmal atrial fibrillation Z95.0  Presence of cardiac pacemaker N18.4 Chronic kidney disease, stage 4 (severe) Facility Procedures CPT4 Code: [id number] Description: (Facility Use Only) [phone] - [place] RT LEG Modifier: Quantity: 1 Physician Procedures CPT4 [id number] Description: [id number] - WC PHYS LEVEL 3 - EST PT Modifier: Quantity: 1 CPT4 Code: Description: ICD-10 Diagnosis Description I89.0 Lymphedema, not elsewhere classified I87.2 Venous insufficiency (chronic) (peripheral) L97.821 Non-pressure chronic ulcer of other part of left lower leg limited to bre L97.811 Non-pressure chronic ulcer of  other part of right lower leg limited to br Modifier: akdown of skin eakdown of skin Quantity: Electronic Signature(s) Signed: 11/17/2020 1:08:06 PM By: Worthy Keeler PA-C Previous Signature: 11/17/2020 1:05:09 PM Version By: Donnamarie Poag Entered By: Worthy Keeler on 11/17/2020 13:08:06

## 2020-11-18 ENCOUNTER — Ambulatory Visit (HOSPITAL_COMMUNITY)
Admission: RE | Admit: 2020-11-18 | Discharge: 2020-11-18 | Disposition: A | Discharge status: Home / Self Care | Payer: Medicare HMO | Source: Ambulatory Visit | Attending: Family Medicine | Admitting: Family Medicine

## 2020-11-18 ENCOUNTER — Other Ambulatory Visit: Payer: Self-pay

## 2020-11-18 ENCOUNTER — Ambulatory Visit (INDEPENDENT_AMBULATORY_CARE_PROVIDER_SITE_OTHER): Payer: Medicare HMO | Admitting: Family Medicine

## 2020-11-18 VITALS — BP 134/86 | HR 71 | Temp 99.5°F

## 2020-11-18 DIAGNOSIS — I517 Cardiomegaly: Secondary | ICD-10-CM

## 2020-11-18 DIAGNOSIS — I2721 Secondary pulmonary arterial hypertension: Secondary | ICD-10-CM | POA: Diagnosis not present

## 2020-11-18 DIAGNOSIS — R0600 Dyspnea, unspecified: Secondary | ICD-10-CM | POA: Diagnosis not present

## 2020-11-18 DIAGNOSIS — R600 Localized edema: Secondary | ICD-10-CM

## 2020-11-18 LAB — ECHOCARDIOGRAM COMPLETE
AR max vel: 1.23 cm2
AV Area VTI: 1.19 cm2
AV Area mean vel: 1.23 cm2
AV Mean grad: 12 mmHg
AV Peak grad: 19.7 mmHg
Ao pk vel: 2.22 m/s
Area-P 1/2: 2.05 cm2
S' Lateral: 1.9 cm

## 2020-11-18 NOTE — Progress Notes (Signed)
*  PRELIMINARY RESULTS* Echocardiogram 2D Echocardiogram has been performed.  Samuel [place] 11/18/2020, 2:52 PM

## 2020-11-18 NOTE — Progress Notes (Addendum)
   Subjective:    Patient ID: [name], female    DOB: August 12, 1926, 87 y.o.   MRN: [id number]  HPI Pt here for follow up on 10/21/20. Pt had lab work for [place] recently. Shortness of breath is better. Right leg is still swelling.  Patient being followed by nephrology in addition to this patient having significant problems with swelling in the lower legs seeing wound care to have this taken care of Recently had echo completed which shows possibility of pulmonary artery hypertension as well as left ventricular hypertrophy She experiences some anxiety and he does not have a scale at home does not know if the weight is gone up  Review of Systems     Objective:   Physical Exam Lungs are clear no crackle heart rate is controlled She does have a wrap on her right lower leg and a knee-high compression stocking on the left side      Assessment & Plan:  1. Pulmonary artery hypertension (HCC) The echo has findings consistent with the possibility of pulmonary artery hypertension this could be causing some of her shortness of breath with activity.  Certainly deconditioning can contribute.  Patient will weigh herself on a daily basis if she sees significant weight gain she will increase diuretic  We will help set up with cardiology  2. Left ventricular hypertrophy Left ventricular hypertrophy noted on echo.  Blood pressure under good control with nephrology  3. PND (paroxysmal nocturnal dyspnea) Patient had mild PND bump up diuretic to 3 tablets daily for the next 2 days then go back to 2 tablets daily  4. Pedal edema Stable currently follow through with kidney doctor  This patient is very debilitated.  Difficult for her to get around.  She is having frequent falls.  She is using a walker.  She is unable to safely utilize a walker to do her ADLs.  A manual wheelchair would be indicated.  She does have family members who would be able to help her sister with this.  She would be unable  to safely do these with a walker.

## 2020-11-19 NOTE — Addendum Note (Signed)
Addended by: Vicente Males on: 11/19/2020 09:53 AM   Modules accepted: Orders

## 2020-11-19 NOTE — Progress Notes (Signed)
11/19/20-Cardiology referral placed

## 2020-11-20 ENCOUNTER — Other Ambulatory Visit: Payer: Self-pay | Admitting: *Deleted

## 2020-11-20 ENCOUNTER — Telehealth: Payer: Self-pay

## 2020-11-20 DIAGNOSIS — E11622 Type 2 diabetes mellitus with other skin ulcer: Secondary | ICD-10-CM | POA: Diagnosis not present

## 2020-11-20 DIAGNOSIS — R931 Abnormal findings on diagnostic imaging of heart and coronary circulation: Secondary | ICD-10-CM

## 2020-11-20 DIAGNOSIS — Z4800 Encounter for change or removal of nonsurgical wound dressing: Secondary | ICD-10-CM | POA: Diagnosis not present

## 2020-11-20 DIAGNOSIS — E1151 Type 2 diabetes mellitus with diabetic peripheral angiopathy without gangrene: Secondary | ICD-10-CM | POA: Diagnosis not present

## 2020-11-20 DIAGNOSIS — I130 Hypertensive heart and chronic kidney disease with heart failure and stage 1 through stage 4 chronic kidney disease, or unspecified chronic kidney disease: Secondary | ICD-10-CM | POA: Diagnosis not present

## 2020-11-20 DIAGNOSIS — I872 Venous insufficiency (chronic) (peripheral): Secondary | ICD-10-CM | POA: Diagnosis not present

## 2020-11-20 DIAGNOSIS — L97812 Non-pressure chronic ulcer of other part of right lower leg with fat layer exposed: Secondary | ICD-10-CM | POA: Diagnosis not present

## 2020-11-20 DIAGNOSIS — I890 Lymphedema, not elsewhere classified: Secondary | ICD-10-CM | POA: Diagnosis not present

## 2020-11-20 DIAGNOSIS — L97822 Non-pressure chronic ulcer of other part of left lower leg with fat layer exposed: Secondary | ICD-10-CM | POA: Diagnosis not present

## 2020-11-20 DIAGNOSIS — I509 Heart failure, unspecified: Secondary | ICD-10-CM | POA: Diagnosis not present

## 2020-11-20 NOTE — Telephone Encounter (Signed)
Would be best for her to use Dulcolax tablet to see if it would help Also may do 1 full capful of MiraLAX daily with 8 ounces of water May also have to do another fleets enema If still not having luck with this needs to give [place] feedback  Obviously if having severe abdominal pain vomiting or signs of obstruction to go to ER

## 2020-11-20 NOTE — Progress Notes (Addendum)
[name], [name] ([id number]) Visit Report for 11/17/2020 Arrival Information Details Patient Name: [name], [name]. Date of Service: 11/17/2020 11:15 AM Medical Record Number: [id number] Patient Account Number: [id number] Date of Birth/Sex: [birth year] (87 y.o. F) Treating RN: Donnamarie Poag Primary Care Trudy Kory: Sallee Lange Other Clinician: Referring Charmika Macdonnell: Sallee Lange Treating Alexias Margerum/Extender: Skipper Cliche in Treatment: 9 Visit Information History Since Last Visit Added or deleted any medications: No Patient Arrived: Wheel Chair Had a fall or experienced change in No Arrival Time: 11:21 activities of daily living that may affect Accompanied By: daughter risk of falls: Transfer Assistance: None Hospitalized since last visit: No Patient Identification Verified: Yes Pain Present Now: No Secondary Verification Process Yes Completed: Patient Has Alerts: Yes Patient Alerts: Patient on Blood Thinner on COUMADIN DIABETIC NON COMPRESSIBLE BIL LEGS Electronic Signature(s) Signed: 11/17/2020 12:44:46 PM By: Jeanine Luz Entered By: Jeanine Luz on 11/17/2020 12:44:46 [name] ([id number]) -------------------------------------------------------------------------------- Clinic Level of Care Assessment Details Patient Name: [name]. Date of Service: 11/17/2020 11:15 AM Medical Record Number: [id number] Patient Account Number: [id number] Date of Birth/Sex: 1927-04-06 (87 y.o. F) Treating RN: Donnamarie Poag Primary Care Jhon Mallozzi: Sallee Lange Other Clinician: Referring Lynnann Knudsen: Sallee Lange Treating Royalty Fakhouri/Extender: Skipper Cliche in Treatment: 9 Clinic Level of Care Assessment Items TOOL 1 Quantity Score '[]'$  - Use when EandM and Procedure is performed on INITIAL visit 0 ASSESSMENTS - Nursing Assessment / Reassessment '[]'$  - General Physical Exam (combine w/ comprehensive assessment (listed just below) when performed on new 0 pt. evals) '[]'$   - 0 Comprehensive Assessment (HX, ROS, Risk Assessments, Wounds Hx, etc.) ASSESSMENTS - Wound and Skin Assessment / Reassessment '[]'$  - Dermatologic / Skin Assessment (not related to wound area) 0 ASSESSMENTS - Ostomy and/or Continence Assessment and Care '[]'$  - Incontinence Assessment and Management 0 '[]'$  - 0 Ostomy Care Assessment and Management (repouching, etc.) PROCESS - Coordination of Care '[]'$  - Simple Patient / Family Education for ongoing care 0 '[]'$  - 0 Complex (extensive) Patient / Family Education for ongoing care '[]'$  - 0 Staff obtains Programmer, systems, Records, Test Results / Process Orders '[]'$  - 0 Staff telephones HHA, Nursing Homes / Clarify orders / etc '[]'$  - 0 Routine Transfer to another Facility (non-emergent condition) '[]'$  - 0 Routine Hospital Admission (non-emergent condition) '[]'$  - 0 New Admissions / Biomedical engineer / Ordering NPWT, Apligraf, etc. '[]'$  - 0 Emergency Hospital Admission (emergent condition) PROCESS - Special Needs '[]'$  - Pediatric / Minor Patient Management 0 '[]'$  - 0 Isolation Patient Management '[]'$  - 0 Hearing / Language / Visual special needs '[]'$  - 0 Assessment of Community assistance (transportation, D/C planning, etc.) '[]'$  - 0 Additional assistance / Altered mentation '[]'$  - 0 Support Surface(s) Assessment (bed, cushion, seat, etc.) INTERVENTIONS - Miscellaneous '[]'$  - External ear exam 0 '[]'$  - 0 Patient Transfer (multiple staff / Civil Service fast streamer / Similar devices) '[]'$  - 0 Simple Staple / Suture removal (25 or less) '[]'$  - 0 Complex Staple / Suture removal (26 or more) '[]'$  - 0 Hypo/Hyperglycemic Management (do not check if billed separately) '[]'$  - 0 Ankle / Brachial Index (ABI) - do not check if billed separately Has the patient been seen at the hospital within the last three years: Yes Total Score: 0 Level Of Care: ____ [name] ([id number]) Electronic Signature(s) Signed: 11/17/2020 1:05:09 PM By: Donnamarie Poag Entered By: Donnamarie Poag on 11/17/2020  11:48:50 [name] ([id number]) -------------------------------------------------------------------------------- Compression Therapy Details Patient Name: [name]. Date of Service: 11/17/2020 11:15 AM  Medical Record Number: [id number] Patient Account Number: [id number] Date of Birth/Sex: 1926/10/23 (87 y.o. F) Treating RN: Donnamarie Poag Primary Care Severin Bou: Sallee Lange Other Clinician: Referring Aubreigh Fuerte: Sallee Lange Treating Shifra Swartzentruber/Extender: Skipper Cliche in Treatment: 9 Compression Therapy Performed for Wound Assessment: Wound #2 Right,Medial,Anterior Lower Leg Performed By: Junius Argyle, RN Compression Type: Three Layer Post Procedure Diagnosis Same as Pre-procedure Electronic Signature(s) Signed: 11/17/2020 1:05:09 PM By: Donnamarie Poag Entered By: Donnamarie Poag on 11/17/2020 11:48:15 [name] ([id number]) -------------------------------------------------------------------------------- Encounter Discharge Information Details Patient Name: [name]. Date of Service: 11/17/2020 11:15 AM Medical Record Number: [id number] Patient Account Number: [id number] Date of Birth/Sex: [birth year] (87 y.o. F) Treating RN: Donnamarie Poag Primary Care Milledge Gerding: Sallee Lange Other Clinician: Referring Dalani Mette: Sallee Lange Treating Anagha Loseke/Extender: Skipper Cliche in Treatment: 9 Encounter Discharge Information Items Discharge Condition: Stable Ambulatory Status: Wheelchair Discharge Destination: Home Transportation: Private Auto Accompanied By: daughter Schedule Follow-up Appointment: Yes Clinical Summary of Care: Electronic Signature(s) Signed: 11/17/2020 1:05:09 PM By: Donnamarie Poag Entered By: Donnamarie Poag on 11/17/2020 12:04:30 [name] ([id number]) -------------------------------------------------------------------------------- Lower Extremity Assessment Details Patient Name: [name]. Date of Service: 11/17/2020  11:15 AM Medical Record Number: [id number] Patient Account Number: [id number] Date of Birth/Sex: [birth year] (87 y.o. F) Treating RN: Donnamarie Poag Primary Care Tanja Gift: Sallee Lange Other Clinician: Referring Mulan Adan: Sallee Lange Treating Katelyne Galster/Extender: Skipper Cliche in Treatment: 9 Edema Assessment Assessed: [Left: Yes] [Right: No] Edema: [Left: Ye] [Right: s] Calf Left: Right: Point of Measurement: 31 cm From Medial Instep 38.5 cm Ankle Left: Right: Point of Measurement: 9 cm From Medial Instep 19.3 cm Vascular Assessment Pulses: Dorsalis Pedis Palpable: [Left:Yes] Electronic Signature(s) Signed: 11/17/2020 1:05:09 PM By: Donnamarie Poag Signed: 11/20/2020 3:50:18 PM By: Jeanine Luz Entered By: Jeanine Luz on 11/17/2020 11:33:33 [name] ([id number]) -------------------------------------------------------------------------------- Multi Wound Chart Details Patient Name: [name]. Date of Service: 11/17/2020 11:15 AM Medical Record Number: [id number] Patient Account Number: [id number] Date of Birth/Sex: 1927-06-01 (87 y.o. F) Treating RN: Donnamarie Poag Primary Care Jonesha Tsuchiya: Sallee Lange Other Clinician: Referring Nikolina Simerson: Sallee Lange Treating Unnamed Hino/Extender: Skipper Cliche in Treatment: 9 Vital Signs Height(in): 39 Pulse(bpm): 39 Weight(lbs): 166 Blood Pressure(mmHg): 158/56 Body Mass Index(BMI): 30 Temperature(F): 98.4 Respiratory Rate(breaths/min): 18 Photos: [N/A:N/A] Wound Location: Right, Medial, Anterior Lower Leg N/A N/A Wounding Event: Blister N/A N/A Primary Etiology: Venous Leg Ulcer N/A N/A Secondary Etiology: Lymphedema N/A N/A Comorbid History: Cataracts, Anemia, Lymphedema, N/A N/A Arrhythmia, Congestive Heart Failure, Coronary Artery Disease, Hypertension, Type II Diabetes, Gout, Osteoarthritis Date Acquired: 07/08/2020 N/A N/A Weeks of Treatment: 9 N/A N/A Wound Status: Open N/A N/A Measurements L x W x D  (cm) 2.2x0.8x0.1 N/A N/A Area (cm) : 1.382 N/A N/A Volume (cm) : 0.138 N/A N/A % Reduction in Area: 97.00% N/A N/A % Reduction in Volume: 97.00% N/A N/A Classification: Full Thickness Without Exposed N/A N/A Support Structures Exudate Amount: Medium N/A N/A Exudate Type: Serous N/A N/A Exudate Color: amber N/A N/A Granulation Amount: Medium (34-66%) N/A N/A Granulation Quality: Pink, Pale N/A N/A Necrotic Amount: Medium (34-66%) N/A N/A Exposed Structures: Fat Layer (Subcutaneous Tissue): N/A N/A Yes Fascia: No Tendon: No Muscle: No Joint: No Bone: No Epithelialization: Small (1-33%) N/A N/A Treatment Notes Electronic Signature(s) Signed: 11/17/2020 1:05:09 PM By: Donnamarie Poag Entered ByDonnamarie Poag on 11/17/2020 11:47:42 [name] ([id number] White, [name] ([id number]) -------------------------------------------------------------------------------- [place] Details Patient Name: [name]. Date of Service: 11/17/2020 11:15 AM Medical Record Number: [id number]  Patient Account Number: [id number] Date of Birth/Sex: Apr 15, 1927 (87 y.o. F) Treating RN: Donnamarie Poag Primary Care Genevra Orne: Sallee Lange Other Clinician: Referring Awanda Wilcock: Sallee Lange Treating Kaegan Hettich/Extender: Skipper Cliche in Treatment: 9 Active Inactive Electronic Signature(s) Signed: 12/18/2020 5:37:08 PM By: Gretta Cool BSN, RN, CWS, Kim RN, BSN Signed: 12/31/2020 3:38:10 PM By: Donnamarie Poag Previous Signature: 11/17/2020 1:05:09 PM Version By: Donnamarie Poag Entered By: Gretta Cool BSN, RN, CWS, Kim on 12/18/2020 17:37:08 [name], [name] ([id number]) -------------------------------------------------------------------------------- Pain Assessment Details Patient Name: [name], [name]. Date of Service: 11/17/2020 11:15 AM Medical Record Number: [id number] Patient Account Number: [id number] Date of Birth/Sex: Jun 30, 1926 (87 y.o. F) Treating RN: Donnamarie Poag Primary  Care Damariz Paganelli: Sallee Lange Other Clinician: Referring Jaslynn Thome: Sallee Lange Treating Yolinda Duerr/Extender: Skipper Cliche in Treatment: 9 Active Problems Location of Pain Severity and Description of Pain Patient Has Paino No Site Locations Rate the pain. Current Pain Level: 0 Pain Management and Medication Current Pain Management: Electronic Signature(s) Signed: 11/17/2020 1:05:09 PM By: Donnamarie Poag Signed: 11/20/2020 3:50:18 PM By: Jeanine Luz Entered By: Jeanine Luz on 11/17/2020 11:24:17 [name] ([id number]) -------------------------------------------------------------------------------- Patient/Caregiver Education Details Patient Name: [name]. Date of Service: 11/17/2020 11:15 AM Medical Record Number: [id number] Patient Account Number: [id number] Date of Birth/Gender: [birth year] (87 y.o. F) Treating RN: Donnamarie Poag Primary Care Physician: Sallee Lange Other Clinician: Referring Physician: Sallee Lange Treating Physician/Extender: Skipper Cliche in Treatment: 9 Education Assessment Education Provided To: Patient Education Topics Provided Wound/Skin Impairment: Electronic Signature(s) Signed: 11/17/2020 1:05:09 PM By: Donnamarie Poag Entered By: Donnamarie Poag on 11/17/2020 11:49:20 [name] ([id number]) -------------------------------------------------------------------------------- Wound Assessment Details Patient Name: [name]. Date of Service: 11/17/2020 11:15 AM Medical Record Number: [id number] Patient Account Number: [id number] Date of Birth/Sex: 1927-01-13 (87 y.o. F) Treating RN: Donnamarie Poag Primary Care Scarlett Portlock: Sallee Lange Other Clinician: Referring Lantz Hermann: Sallee Lange Treating Elishia Kaczorowski/Extender: Skipper Cliche in Treatment: 9 Wound Status Wound Number: 2 Primary Venous Leg Ulcer Etiology: Wound Location: Right, Medial, Anterior Lower Leg Secondary Lymphedema Wounding Event:  Blister Etiology: Date Acquired: 07/08/2020 Wound Open Weeks Of Treatment: 9 Status: Clustered Wound: No Comorbid Cataracts, Anemia, Lymphedema, Arrhythmia, Congestive History: Heart Failure, Coronary Artery Disease, Hypertension, Type II Diabetes, Gout, Osteoarthritis Photos Wound Measurements Length: (cm) 2.2 Width: (cm) 0.8 Depth: (cm) 0.1 Area: (cm) 1.382 Volume: (cm) 0.138 % Reduction in Area: 97% % Reduction in Volume: 97% Epithelialization: Small (1-33%) Tunneling: No Undermining: No Wound Description Classification: Full Thickness Without Exposed Support Structu Exudate Amount: Medium Exudate Type: Serous Exudate Color: amber res Foul Odor After Cleansing: No Slough/Fibrino Yes Wound Bed Granulation Amount: Medium (34-66%) Exposed Structure Granulation Quality: Pink, Pale Fascia Exposed: No Necrotic Amount: Medium (34-66%) Fat Layer (Subcutaneous Tissue) Exposed: Yes Necrotic Quality: Adherent Slough Tendon Exposed: No Muscle Exposed: No Joint Exposed: No Bone Exposed: No Electronic Signature(s) Signed: 11/17/2020 1:05:09 PM By: Donnamarie Poag Signed: 11/20/2020 3:50:18 PM By: Jeanine Luz Entered By: Jeanine Luz on 11/17/2020 11:31:25 [name] ([id number]) -------------------------------------------------------------------------------- Vitals Details Patient Name: [name]. Date of Service: 11/17/2020 11:15 AM Medical Record Number: [id number] Patient Account Number: [id number] Date of Birth/Sex: Jan 19, 1927 (87 y.o. F) Treating RN: Donnamarie Poag Primary Care Bailey Kolbe: Sallee Lange Other Clinician: Referring Marcheta Horsey: Sallee Lange Treating Tonianne Fine/Extender: Skipper Cliche in Treatment: 9 Vital Signs Time Taken: 11:20 Temperature (F): 98.4 Height (in): 62 Pulse (bpm): 84 Weight (lbs): 166 Respiratory Rate (breaths/min): 18 Body Mass Index (BMI): 30.4 Blood Pressure (mmHg): 158/56 Reference Range: 80 - 120 mg /  dl Electronic Signature(s) Signed: 11/20/2020 3:50:18 PM By: Jeanine Luz Entered By: Jeanine Luz on 11/17/2020 11:24:11

## 2020-11-20 NOTE — Telephone Encounter (Signed)
Daughter [name] ([place]) advised per Dr Nicki Reaper: Would be best for her to use Dulcolax tablet to see if it would help Also may do 1 full capful of MiraLAX daily with 8 ounces of water May also have to do another fleets enema If still not having luck with this needs to give [place] feedback If having severe abdominal pain vomiting or signs of obstruction to go to ER  Daughter verbalized understanding.

## 2020-11-20 NOTE — Telephone Encounter (Signed)
Usually has bm every day and daughter gave enema Saturday and had to strain but finally had bm Saturday. Took one half cup of miralax last night and no results.

## 2020-11-20 NOTE — Telephone Encounter (Signed)
Wanted to let Dr Nicki Reaper not that she has not a bowel movement since Saturday daughter give her enema Saturday and she took some Murelax last night.   Pt call back [phone]

## 2020-11-21 ENCOUNTER — Emergency Department (HOSPITAL_COMMUNITY): Payer: Medicare HMO

## 2020-11-21 ENCOUNTER — Inpatient Hospital Stay (HOSPITAL_COMMUNITY)
Admission: EM | Admit: 2020-11-21 | Discharge: 2020-11-25 | DRG: 291 | Discharge status: Against Medical Advice | Payer: Medicare HMO | Attending: Internal Medicine | Admitting: Internal Medicine

## 2020-11-21 ENCOUNTER — Encounter (HOSPITAL_COMMUNITY): Payer: Self-pay

## 2020-11-21 ENCOUNTER — Other Ambulatory Visit: Payer: Self-pay

## 2020-11-21 DIAGNOSIS — I5033 Acute on chronic diastolic (congestive) heart failure: Secondary | ICD-10-CM | POA: Diagnosis not present

## 2020-11-21 DIAGNOSIS — I4891 Unspecified atrial fibrillation: Secondary | ICD-10-CM | POA: Diagnosis not present

## 2020-11-21 DIAGNOSIS — M19042 Primary osteoarthritis, left hand: Secondary | ICD-10-CM | POA: Diagnosis present

## 2020-11-21 DIAGNOSIS — E1122 Type 2 diabetes mellitus with diabetic chronic kidney disease: Secondary | ICD-10-CM | POA: Diagnosis present

## 2020-11-21 DIAGNOSIS — I083 Combined rheumatic disorders of mitral, aortic and tricuspid valves: Secondary | ICD-10-CM | POA: Diagnosis present

## 2020-11-21 DIAGNOSIS — Z5329 Procedure and treatment not carried out because of patient's decision for other reasons: Secondary | ICD-10-CM | POA: Diagnosis present

## 2020-11-21 DIAGNOSIS — R748 Abnormal levels of other serum enzymes: Secondary | ICD-10-CM | POA: Diagnosis not present

## 2020-11-21 DIAGNOSIS — M1712 Unilateral primary osteoarthritis, left knee: Secondary | ICD-10-CM | POA: Diagnosis present

## 2020-11-21 DIAGNOSIS — N184 Chronic kidney disease, stage 4 (severe): Secondary | ICD-10-CM | POA: Diagnosis present

## 2020-11-21 DIAGNOSIS — M19041 Primary osteoarthritis, right hand: Secondary | ICD-10-CM | POA: Diagnosis present

## 2020-11-21 DIAGNOSIS — I872 Venous insufficiency (chronic) (peripheral): Secondary | ICD-10-CM | POA: Diagnosis present

## 2020-11-21 DIAGNOSIS — I509 Heart failure, unspecified: Secondary | ICD-10-CM | POA: Diagnosis not present

## 2020-11-21 DIAGNOSIS — Z885 Allergy status to narcotic agent status: Secondary | ICD-10-CM | POA: Diagnosis not present

## 2020-11-21 DIAGNOSIS — Z7901 Long term (current) use of anticoagulants: Secondary | ICD-10-CM

## 2020-11-21 DIAGNOSIS — Z950 Presence of cardiac pacemaker: Secondary | ICD-10-CM

## 2020-11-21 DIAGNOSIS — R778 Other specified abnormalities of plasma proteins: Secondary | ICD-10-CM | POA: Diagnosis not present

## 2020-11-21 DIAGNOSIS — J9601 Acute respiratory failure with hypoxia: Secondary | ICD-10-CM | POA: Diagnosis not present

## 2020-11-21 DIAGNOSIS — K921 Melena: Secondary | ICD-10-CM | POA: Diagnosis not present

## 2020-11-21 DIAGNOSIS — Z96651 Presence of right artificial knee joint: Secondary | ICD-10-CM | POA: Diagnosis present

## 2020-11-21 DIAGNOSIS — E785 Hyperlipidemia, unspecified: Secondary | ICD-10-CM | POA: Diagnosis present

## 2020-11-21 DIAGNOSIS — I272 Pulmonary hypertension, unspecified: Secondary | ICD-10-CM | POA: Diagnosis present

## 2020-11-21 DIAGNOSIS — Z8673 Personal history of transient ischemic attack (TIA), and cerebral infarction without residual deficits: Secondary | ICD-10-CM | POA: Diagnosis not present

## 2020-11-21 DIAGNOSIS — I130 Hypertensive heart and chronic kidney disease with heart failure and stage 1 through stage 4 chronic kidney disease, or unspecified chronic kidney disease: Principal | ICD-10-CM | POA: Diagnosis present

## 2020-11-21 DIAGNOSIS — K922 Gastrointestinal hemorrhage, unspecified: Secondary | ICD-10-CM | POA: Diagnosis present

## 2020-11-21 DIAGNOSIS — K5641 Fecal impaction: Secondary | ICD-10-CM

## 2020-11-21 DIAGNOSIS — I5031 Acute diastolic (congestive) heart failure: Secondary | ICD-10-CM | POA: Diagnosis not present

## 2020-11-21 DIAGNOSIS — I4819 Other persistent atrial fibrillation: Secondary | ICD-10-CM | POA: Diagnosis present

## 2020-11-21 DIAGNOSIS — K5909 Other constipation: Secondary | ICD-10-CM | POA: Diagnosis not present

## 2020-11-21 DIAGNOSIS — K5900 Constipation, unspecified: Secondary | ICD-10-CM | POA: Diagnosis not present

## 2020-11-21 DIAGNOSIS — I495 Sick sinus syndrome: Secondary | ICD-10-CM | POA: Diagnosis not present

## 2020-11-21 DIAGNOSIS — I110 Hypertensive heart disease with heart failure: Secondary | ICD-10-CM | POA: Diagnosis not present

## 2020-11-21 DIAGNOSIS — R7989 Other specified abnormal findings of blood chemistry: Secondary | ICD-10-CM

## 2020-11-21 DIAGNOSIS — I700 Atherosclerosis of aorta: Secondary | ICD-10-CM | POA: Diagnosis not present

## 2020-11-21 DIAGNOSIS — I248 Other forms of acute ischemic heart disease: Secondary | ICD-10-CM | POA: Diagnosis not present

## 2020-11-21 DIAGNOSIS — R110 Nausea: Secondary | ICD-10-CM

## 2020-11-21 DIAGNOSIS — J42 Unspecified chronic bronchitis: Secondary | ICD-10-CM | POA: Diagnosis present

## 2020-11-21 DIAGNOSIS — Z20822 Contact with and (suspected) exposure to covid-19: Secondary | ICD-10-CM | POA: Diagnosis not present

## 2020-11-21 DIAGNOSIS — R630 Anorexia: Secondary | ICD-10-CM

## 2020-11-21 DIAGNOSIS — G40909 Epilepsy, unspecified, not intractable, without status epilepticus: Secondary | ICD-10-CM | POA: Diagnosis not present

## 2020-11-21 DIAGNOSIS — I10 Essential (primary) hypertension: Secondary | ICD-10-CM | POA: Diagnosis not present

## 2020-11-21 DIAGNOSIS — Z79899 Other long term (current) drug therapy: Secondary | ICD-10-CM

## 2020-11-21 DIAGNOSIS — E782 Mixed hyperlipidemia: Secondary | ICD-10-CM | POA: Diagnosis not present

## 2020-11-21 NOTE — ED Triage Notes (Signed)
Pt reports constipation since last Saturday. Pt has taken bottle of mag citrate and miralax today with no results.

## 2020-11-22 ENCOUNTER — Encounter (HOSPITAL_COMMUNITY): Payer: Self-pay | Admitting: Internal Medicine

## 2020-11-22 ENCOUNTER — Inpatient Hospital Stay (HOSPITAL_COMMUNITY): Payer: Medicare HMO

## 2020-11-22 ENCOUNTER — Other Ambulatory Visit: Payer: Self-pay

## 2020-11-22 DIAGNOSIS — I5033 Acute on chronic diastolic (congestive) heart failure: Secondary | ICD-10-CM | POA: Diagnosis not present

## 2020-11-22 DIAGNOSIS — J9601 Acute respiratory failure with hypoxia: Secondary | ICD-10-CM | POA: Diagnosis not present

## 2020-11-22 DIAGNOSIS — M1712 Unilateral primary osteoarthritis, left knee: Secondary | ICD-10-CM | POA: Diagnosis present

## 2020-11-22 DIAGNOSIS — Z96651 Presence of right artificial knee joint: Secondary | ICD-10-CM | POA: Diagnosis present

## 2020-11-22 DIAGNOSIS — K922 Gastrointestinal hemorrhage, unspecified: Secondary | ICD-10-CM | POA: Diagnosis present

## 2020-11-22 DIAGNOSIS — E1122 Type 2 diabetes mellitus with diabetic chronic kidney disease: Secondary | ICD-10-CM | POA: Diagnosis not present

## 2020-11-22 DIAGNOSIS — R748 Abnormal levels of other serum enzymes: Secondary | ICD-10-CM

## 2020-11-22 DIAGNOSIS — K921 Melena: Secondary | ICD-10-CM

## 2020-11-22 DIAGNOSIS — J42 Unspecified chronic bronchitis: Secondary | ICD-10-CM | POA: Diagnosis present

## 2020-11-22 DIAGNOSIS — I5031 Acute diastolic (congestive) heart failure: Secondary | ICD-10-CM

## 2020-11-22 DIAGNOSIS — I4819 Other persistent atrial fibrillation: Secondary | ICD-10-CM | POA: Diagnosis not present

## 2020-11-22 DIAGNOSIS — K5641 Fecal impaction: Secondary | ICD-10-CM | POA: Diagnosis present

## 2020-11-22 DIAGNOSIS — I130 Hypertensive heart and chronic kidney disease with heart failure and stage 1 through stage 4 chronic kidney disease, or unspecified chronic kidney disease: Secondary | ICD-10-CM | POA: Diagnosis not present

## 2020-11-22 DIAGNOSIS — K5900 Constipation, unspecified: Secondary | ICD-10-CM

## 2020-11-22 DIAGNOSIS — E782 Mixed hyperlipidemia: Secondary | ICD-10-CM

## 2020-11-22 DIAGNOSIS — M19041 Primary osteoarthritis, right hand: Secondary | ICD-10-CM | POA: Diagnosis present

## 2020-11-22 DIAGNOSIS — R778 Other specified abnormalities of plasma proteins: Secondary | ICD-10-CM | POA: Diagnosis not present

## 2020-11-22 DIAGNOSIS — I509 Heart failure, unspecified: Secondary | ICD-10-CM

## 2020-11-22 DIAGNOSIS — M19042 Primary osteoarthritis, left hand: Secondary | ICD-10-CM | POA: Diagnosis present

## 2020-11-22 DIAGNOSIS — N184 Chronic kidney disease, stage 4 (severe): Secondary | ICD-10-CM

## 2020-11-22 DIAGNOSIS — G40909 Epilepsy, unspecified, not intractable, without status epilepticus: Secondary | ICD-10-CM | POA: Diagnosis not present

## 2020-11-22 DIAGNOSIS — K5909 Other constipation: Secondary | ICD-10-CM | POA: Diagnosis not present

## 2020-11-22 DIAGNOSIS — Z885 Allergy status to narcotic agent status: Secondary | ICD-10-CM | POA: Diagnosis not present

## 2020-11-22 DIAGNOSIS — I4891 Unspecified atrial fibrillation: Secondary | ICD-10-CM | POA: Diagnosis not present

## 2020-11-22 DIAGNOSIS — I248 Other forms of acute ischemic heart disease: Secondary | ICD-10-CM | POA: Diagnosis not present

## 2020-11-22 DIAGNOSIS — I495 Sick sinus syndrome: Secondary | ICD-10-CM

## 2020-11-22 DIAGNOSIS — I272 Pulmonary hypertension, unspecified: Secondary | ICD-10-CM | POA: Diagnosis not present

## 2020-11-22 DIAGNOSIS — R630 Anorexia: Secondary | ICD-10-CM | POA: Diagnosis not present

## 2020-11-22 DIAGNOSIS — I10 Essential (primary) hypertension: Secondary | ICD-10-CM

## 2020-11-22 DIAGNOSIS — Z7901 Long term (current) use of anticoagulants: Secondary | ICD-10-CM | POA: Diagnosis not present

## 2020-11-22 DIAGNOSIS — Z8673 Personal history of transient ischemic attack (TIA), and cerebral infarction without residual deficits: Secondary | ICD-10-CM | POA: Diagnosis not present

## 2020-11-22 DIAGNOSIS — Z20822 Contact with and (suspected) exposure to covid-19: Secondary | ICD-10-CM | POA: Diagnosis not present

## 2020-11-22 DIAGNOSIS — I700 Atherosclerosis of aorta: Secondary | ICD-10-CM | POA: Diagnosis not present

## 2020-11-22 DIAGNOSIS — R7989 Other specified abnormal findings of blood chemistry: Secondary | ICD-10-CM

## 2020-11-22 DIAGNOSIS — R110 Nausea: Secondary | ICD-10-CM | POA: Diagnosis not present

## 2020-11-22 DIAGNOSIS — Z950 Presence of cardiac pacemaker: Secondary | ICD-10-CM | POA: Diagnosis not present

## 2020-11-22 DIAGNOSIS — I083 Combined rheumatic disorders of mitral, aortic and tricuspid valves: Secondary | ICD-10-CM | POA: Diagnosis not present

## 2020-11-22 DIAGNOSIS — E785 Hyperlipidemia, unspecified: Secondary | ICD-10-CM | POA: Diagnosis present

## 2020-11-22 LAB — CBC WITH DIFFERENTIAL/PLATELET
Abs Immature Granulocytes: 0.02 10*3/uL (ref 0.00–0.07)
Basophils Absolute: 0 10*3/uL (ref 0.0–0.1)
Basophils Relative: 0 %
Eosinophils Absolute: 0 10*3/uL (ref 0.0–0.5)
Eosinophils Relative: 0 %
HCT: 38.8 % (ref 36.0–46.0)
Hemoglobin: 11.9 g/dL — ABNORMAL LOW (ref 12.0–15.0)
Immature Granulocytes: 0 %
Lymphocytes Relative: 16 %
Lymphs Abs: 1.3 10*3/uL (ref 0.7–4.0)
MCH: 27.7 pg (ref 26.0–34.0)
MCHC: 30.7 g/dL (ref 30.0–36.0)
MCV: 90.4 fL (ref 80.0–100.0)
Monocytes Absolute: 0.9 10*3/uL (ref 0.1–1.0)
Monocytes Relative: 11 %
Neutro Abs: 6 10*3/uL (ref 1.7–7.7)
Neutrophils Relative %: 73 %
Platelets: 196 10*3/uL (ref 150–400)
RBC: 4.29 MIL/uL (ref 3.87–5.11)
RDW: 13.7 % (ref 11.5–15.5)
WBC: 8.3 10*3/uL (ref 4.0–10.5)
nRBC: 0 % (ref 0.0–0.2)

## 2020-11-22 LAB — TYPE AND SCREEN
ABO/RH(D): AB POS
Antibody Screen: NEGATIVE

## 2020-11-22 LAB — COMPREHENSIVE METABOLIC PANEL
ALT: 17 U/L (ref 0–44)
AST: 24 U/L (ref 15–41)
Albumin: 4.7 g/dL (ref 3.5–5.0)
Alkaline Phosphatase: 61 U/L (ref 38–126)
Anion gap: 12 (ref 5–15)
BUN: 40 mg/dL — ABNORMAL HIGH (ref 8–23)
CO2: 26 mmol/L (ref 22–32)
Calcium: 10.6 mg/dL — ABNORMAL HIGH (ref 8.9–10.3)
Chloride: 96 mmol/L — ABNORMAL LOW (ref 98–111)
Creatinine, Ser: 2.12 mg/dL — ABNORMAL HIGH (ref 0.44–1.00)
GFR, Estimated: 22 mL/min — ABNORMAL LOW (ref >60)
Glucose, Bld: 98 mg/dL (ref 70–99)
Potassium: 4.1 mmol/L (ref 3.5–5.1)
Sodium: 134 mmol/L — ABNORMAL LOW (ref 135–145)
Total Bilirubin: 0.7 mg/dL (ref 0.3–1.2)
Total Protein: 8.1 g/dL (ref 6.5–8.1)

## 2020-11-22 LAB — TROPONIN I (HIGH SENSITIVITY)
Troponin I (High Sensitivity): 23 ng/L — ABNORMAL HIGH (ref <18)
Troponin I (High Sensitivity): 30 ng/L — ABNORMAL HIGH (ref <18)
Troponin I (High Sensitivity): 37 ng/L — ABNORMAL HIGH (ref <18)
Troponin I (High Sensitivity): 35 ng/L — ABNORMAL HIGH (ref <18)

## 2020-11-22 LAB — MAGNESIUM: Magnesium: 2.2 mg/dL (ref 1.7–2.4)

## 2020-11-22 LAB — PROTIME-INR
INR: 1.6 — ABNORMAL HIGH (ref 0.8–1.2)
Prothrombin Time: 18.7 seconds — ABNORMAL HIGH (ref 11.4–15.2)

## 2020-11-22 LAB — POC OCCULT BLOOD, ED: Fecal Occult Bld: POSITIVE — AB

## 2020-11-22 LAB — LIPASE, BLOOD: Lipase: 57 U/L — ABNORMAL HIGH (ref 11–51)

## 2020-11-22 LAB — ECHOCARDIOGRAM COMPLETE
Area-P 1/2: 1.62 cm2
Height: 63 in
MV VTI: 0.52 cm2
S' Lateral: 2.22 cm
Weight: 2560 oz

## 2020-11-22 LAB — URINALYSIS, ROUTINE W REFLEX MICROSCOPIC
Bilirubin Urine: NEGATIVE
Glucose, UA: NEGATIVE mg/dL
Ketones, ur: NEGATIVE mg/dL
Leukocytes,Ua: NEGATIVE
Nitrite: NEGATIVE
Protein, ur: NEGATIVE mg/dL
Specific Gravity, Urine: 1.005 (ref 1.005–1.030)
pH: 7 (ref 5.0–8.0)

## 2020-11-22 LAB — BRAIN NATRIURETIC PEPTIDE: B Natriuretic Peptide: 663 pg/mL — ABNORMAL HIGH (ref 0.0–100.0)

## 2020-11-22 LAB — RESP PANEL BY RT-PCR (FLU A&B, COVID) ARPGX2
Influenza A by PCR: NEGATIVE
Influenza B by PCR: NEGATIVE
SARS Coronavirus 2 by RT PCR: NEGATIVE

## 2020-11-22 LAB — PHOSPHORUS: Phosphorus: 2.7 mg/dL (ref 2.5–4.6)

## 2020-11-22 MED ORDER — ALBUTEROL SULFATE HFA 108 (90 BASE) MCG/ACT IN AERS
1.0000 | INHALATION_SPRAY | Freq: Four times a day (QID) | RESPIRATORY_TRACT | Status: DC | PRN
  Administered 2020-11-23 – 2020-11-24 (×3): 2 via RESPIRATORY_TRACT
  Filled 2020-11-22: qty 6.7

## 2020-11-22 MED ORDER — FUROSEMIDE 10 MG/ML IJ SOLN
60.0000 mg | Freq: Once | INTRAMUSCULAR | Status: AC
  Administered 2020-11-22: 60 mg via INTRAVENOUS
  Filled 2020-11-22: qty 6

## 2020-11-22 MED ORDER — PANTOPRAZOLE SODIUM 40 MG IV SOLR
40.0000 mg | Freq: Two times a day (BID) | INTRAVENOUS | Status: DC
  Administered 2020-11-22 – 2020-11-25 (×7): 40 mg via INTRAVENOUS
  Filled 2020-11-22 (×6): qty 40

## 2020-11-22 MED ORDER — FUROSEMIDE 10 MG/ML IJ SOLN
60.0000 mg | Freq: Two times a day (BID) | INTRAMUSCULAR | Status: DC
  Administered 2020-11-22 – 2020-11-24 (×5): 60 mg via INTRAVENOUS
  Filled 2020-11-22 (×6): qty 6

## 2020-11-22 MED ORDER — FUROSEMIDE 10 MG/ML IJ SOLN
40.0000 mg | Freq: Two times a day (BID) | INTRAMUSCULAR | Status: DC
  Administered 2020-11-22: 40 mg via INTRAVENOUS
  Filled 2020-11-22: qty 4

## 2020-11-22 MED ORDER — POLYETHYLENE GLYCOL 3350 17 G PO PACK
17.0000 g | PACK | Freq: Every day | ORAL | Status: DC
  Administered 2020-11-22: 17 g via ORAL
  Filled 2020-11-22: qty 1

## 2020-11-22 MED ORDER — ACETAMINOPHEN 325 MG PO TABS
650.0000 mg | ORAL_TABLET | Freq: Four times a day (QID) | ORAL | Status: DC | PRN
  Administered 2020-11-23: 650 mg via ORAL
  Filled 2020-11-22: qty 2

## 2020-11-22 MED ORDER — PROSOURCE PLUS PO LIQD
30.0000 mL | Freq: Every day | ORAL | Status: DC
  Administered 2020-11-22 – 2020-11-25 (×4): 30 mL via ORAL
  Filled 2020-11-22 (×4): qty 30

## 2020-11-22 MED ORDER — PRAVASTATIN SODIUM 40 MG PO TABS
80.0000 mg | ORAL_TABLET | Freq: Every day | ORAL | Status: DC
  Administered 2020-11-22: 80 mg via ORAL
  Filled 2020-11-22: qty 2

## 2020-11-22 MED ORDER — FUROSEMIDE 10 MG/ML IJ SOLN: 20.0000 mg | Freq: Once | INTRAMUSCULAR | Status: DC

## 2020-11-22 MED ORDER — DILTIAZEM HCL ER BEADS 240 MG PO CP24
360.0000 mg | ORAL_CAPSULE | Freq: Every day | ORAL | Status: DC
  Filled 2020-11-22 (×3): qty 1

## 2020-11-22 MED ORDER — LATANOPROST 0.005 % OP SOLN
1.0000 [drp] | Freq: Every day | OPHTHALMIC | Status: DC
  Administered 2020-11-22 – 2020-11-24 (×3): 1 [drp] via OPHTHALMIC
  Filled 2020-11-22: qty 2.5

## 2020-11-22 MED ORDER — DILTIAZEM HCL 25 MG/5ML IV SOLN
10.0000 mg | Freq: Once | INTRAVENOUS | Status: AC
  Administered 2020-11-22: 10 mg via INTRAVENOUS
  Filled 2020-11-22: qty 5

## 2020-11-22 MED ORDER — POLYETHYLENE GLYCOL 3350 17 G PO PACK
17.0000 g | PACK | Freq: Two times a day (BID) | ORAL | Status: DC
  Administered 2020-11-22 – 2020-11-24 (×5): 17 g via ORAL
  Filled 2020-11-22 (×6): qty 1

## 2020-11-22 MED ORDER — BOOST / RESOURCE BREEZE PO LIQD CUSTOM
1.0000 | Freq: Three times a day (TID) | ORAL | Status: DC
  Administered 2020-11-22 – 2020-11-25 (×7): 1 via ORAL

## 2020-11-22 MED ORDER — ADULT MULTIVITAMIN W/MINERALS CH
1.0000 | ORAL_TABLET | Freq: Every day | ORAL | Status: DC
  Administered 2020-11-22 – 2020-11-25 (×4): 1 via ORAL
  Filled 2020-11-22 (×4): qty 1

## 2020-11-22 MED ORDER — ONDANSETRON HCL 4 MG/2ML IJ SOLN: 4.0000 mg | Freq: Four times a day (QID) | INTRAMUSCULAR | Status: DC | PRN

## 2020-11-22 MED ORDER — PRAVASTATIN SODIUM 40 MG PO TABS
80.0000 mg | ORAL_TABLET | Freq: Every day | ORAL | Status: DC
  Administered 2020-11-22 – 2020-11-25 (×4): 80 mg via ORAL
  Filled 2020-11-22 (×4): qty 2

## 2020-11-22 MED ORDER — DILTIAZEM HCL ER COATED BEADS 180 MG PO CP24
360.0000 mg | ORAL_CAPSULE | Freq: Every day | ORAL | Status: DC
  Administered 2020-11-22 – 2020-11-25 (×4): 360 mg via ORAL
  Filled 2020-11-22 (×4): qty 2

## 2020-11-22 NOTE — Progress Notes (Signed)
  Echocardiogram 2D Echocardiogram has been performed.  [name] 11/22/2020, 1:28 PM

## 2020-11-22 NOTE — Consult Note (Signed)
Referring Provider: Orson Eva, DO Primary Care Physician:  Kathyrn Drown, MD Primary Gastroenterologist:  Dr. Laural Golden  Reason for Consultation:    Melena and fecal impaction.  HPI:   History is obtained from patient and her husband and daughter Harmon Pier who are at bedside.  Patient is 87 year old African-American female who has not been feeling well over the last 4 to 6 months according to her daughter.  She has had poor appetite and she has lost close to 20 pounds.  She also has complaint of nausea and about 9 days ago she was heaving according to her husband.  She developed constipation.  She was seen by Dr. Wolfgang Phoenix and she has been taking polyethylene glycol and also took mag citrate but did not have good results.  Her daughter states that she had constipation several months ago but then she got better.  Patient says she was begun on iron pill by her nephrologist 3 to 4 months ago. Evaluation in emergency room revealed bilateral pleural effusions cardiomegaly and vascular congestion consistent with CHF. Acute abdominal series revealed moderate stool burden and distended rectum.  Her stool was noted to be black and heme positive.  She was disimpacted by Dr. Ezequiel Essex. Patient was admitted to hospital service for management of her CHF. Patient's husband says her lower extremity edema has decreased since yesterday.  However she is still short of breath.  She denies heartburn dysphagia or abdominal pain.  She also denies rectal bleeding.  She has had poor appetite for the last 4 to 6 months according to her daughter.  She has lost close to 20 pounds.  Family has been pushing her to drink Ensure.  Patient lives at home with her husband.  They have 2 sons and 3 daughters all of whom live close by and check on the parents every day.  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  She has never smoked cigarettes or drink alcohol. She had 5 brothers and 2 sisters  and they are all disease.  1 brother died of pneumonia at age 51.  2 brothers died of alcoholic cirrhosis and 1 brother died of ruptured intracranial aneurysm.  She states her brothers were in their 55s or 51s when they died.  1 sister had coronary artery disease and diabetes mellitus.  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 ;magnetic MRI->  multiple cerebral infarctions; Rx-ASA but subsequently changed to coumadin  when found to have rheumatic mitral valve disease carotid duplex mild plaque in 08/2008   Chronic bronchitis    Chronic renal insufficiency    baseline creatine 1.4; 1.04 in 03/2010   DJD (degenerative joint disease)    hands, knees   Gout    Hyperlipidemia    Hypertension    heart disease diastolic dysfunction ; 123456; mild pulmonary edema in 2006;responded to diurectics ; LVH   Kidney stones    Mitral valve disease    Severe mitral annular calcification; mild to moderate stenosis-not clearly rheumatic   Nephrolithiasis    Pacemaker    Retinal hemorrhage  Seizure disorder ([place])    Shortness of breath    only with exertion   Stroke ([place])    Tachycardia-bradycardia syndrome ([place])    with pauses and syncope;PPM implantation 10/2008,negative stress nuclear study 03/2005   Vertigo     Past Surgical History:  Procedure Laterality Date   CATARACT EXTRACTION W/PHACO  03/29/2011   Procedure: CATARACT EXTRACTION PHACO AND INTRAOCULAR LENS PLACEMENT ([place]);  Surgeon: Tonny Branch;  Location: [facility];  Service: Ophthalmology;  Laterality: Right;  CDE 12.62   INSERT / REPLACE / REMOVE PACEMAKER  11/2008   [employer] DUAL chamber pacemaker 11/06/2008    MEMBRANE PEEL Right 02/12/2014   Procedure: MEMBRANE PEEL;  Surgeon: [name], MD;  Location: [place];  Service: Ophthalmology;  Laterality: Right;   PARS PLANA VITRECTOMY Right 02/12/2014   Procedure: PARS PLANA VITRECTOMY WITH 25 GAUGE;  Surgeon: [name], MD;  Location: [employer];  Service: Ophthalmology;  Laterality: Right;   PPM GENERATOR CHANGEOUT N/A 03/17/2020   Procedure: PPM GENERATOR CHANGEOUT;  Surgeon: Deboraha Sprang, MD;  Location: [place] [facility];  Service: Cardiovascular;  Laterality: N/A;   TOTAL KNEE ARTHROPLASTY  04/2009   Right    Prior to Admission medications   Medication Sig Start Date End Date Taking? Authorizing Provider  albuterol (VENTOLIN HFA) 108 (90 Base) MCG/ACT inhaler TAKE 2 PUFFS EVERY 6 HOURS AS NEEDED 10/23/20  Yes Luking, Scott A, MD  amLODipine (NORVASC) 5 MG tablet Take 1 tablet (5 mg total) by mouth daily. 04/02/20  Yes Luking, Scott A, MD  diltiazem (TIAZAC) 360 MG 24 hr capsule TAKE ONE CAPSULE BY MOUTH ONCE DAILY. 09/29/20  Yes Kathyrn Drown, MD  mupirocin ointment (BACTROBAN) 2 % Apply thin amount daily to skin blister 07/14/20  Yes Luking, Scott A, MD  pravastatin (PRAVACHOL) 80 MG tablet TAKE (1) TABLET BY MOUTH AT BEDTIME. 05/21/20  Yes Luking, Scott A, MD  torsemide (DEMADEX) 20 MG tablet TAKE 3 TABLETS BY MOUTH DAILY 10/29/20  Yes Luking, Scott A, MD  Travoprost, BAK Free, (TRAVATAN) 0.004 % SOLN ophthalmic solution Place 1 drop into both eyes at bedtime.    Yes [provider]  VITAMIN D, CHOLECALCIFEROL, PO Take 1 tablet by mouth every morning.   Yes [provider]  warfarin (COUMADIN) 5 MG tablet TAKE 1/2 TO 1 TABLET BY MOUTH DAILY OR AS DIRECTED. 04/21/20  Yes Evans Lance, MD  allopurinol (ZYLOPRIM) 100 MG tablet TAKE 1/2 TABLET BY MOUTH ON MONDAY AND FRIDAY Patient not taking: Reported on 11/22/2020 05/05/20   Kathyrn Drown, MD  ferrous sulfate 324 (65 Fe) MG TBEC Take 1 tablet by mouth daily. Patient not  taking: No sig reported    [provider]  meclizine (ANTIVERT) 25 MG tablet Take 1 tablet (25 mg total) by mouth 2 (two) times daily as needed for dizziness. Patient not taking: No sig reported 05/06/20   Kathyrn Drown, MD    Current Facility-Administered Medications  Medication Dose Route Frequency Provider Last Rate Last Admin   acetaminophen (TYLENOL) tablet 650 mg  650 mg Oral Q6H PRN Tat, Shanon Brow, MD       diltiazem (CARDIZEM CD) 24 hr capsule 360 mg  360 mg Oral Daily Tat, David, MD   360 mg at 11/22/20 1011   furosemide (LASIX) injection 60 mg  60 mg Intravenous BID Tat, David, MD       ondansetron [facility]) injection 4 mg  4 mg Intravenous Q6H PRN  Bernadette Hoit, DO       pantoprazole (PROTONIX) injection 40 mg  40 mg Intravenous Q12H Rancour, Annie Main, MD   40 mg at 11/22/20 0854   polyethylene glycol (MIRALAX / GLYCOLAX) packet 17 g  17 g Oral BID Tat, David, MD       pravastatin (PRAVACHOL) tablet 80 mg  80 mg Oral q1800 Tat, Shanon Brow, MD        Allergies as of 11/21/2020 - Review Complete 11/21/2020  Allergen Reaction Noted   Tramadol  04/14/2017    Family History  Problem Relation Age of Onset   Cancer Other        unknown cancer   Hypotension Neg Hx    Anesthesia problems Neg Hx    Pseudochol deficiency Neg Hx    Malignant hyperthermia Neg Hx     Social History   Socioeconomic History   Marital status: Married    Spouse name: Not on file   Number of children: Not on file   Years of education: Not on file   Highest education level: Not on file  Occupational History   Not on file  Tobacco Use   Smoking status: Never   Smokeless tobacco: Never  Vaping Use   Vaping Use: Never used  Substance and Sexual Activity   Alcohol use: No    Alcohol/week: 0.0 standard drinks   Drug use: No   Sexual activity: Not on file  Other Topics Concern   Not on file  Social History Narrative   Not on file   Social Determinants of Health   Financial Resource  Strain: Not on file  Food Insecurity: Not on file  Transportation Needs: Not on file  Physical Activity: Not on file  Stress: Not on file  Social Connections: Not on file  Intimate Partner Violence: Not on file    Review of Systems: See HPI, otherwise normal ROS  Physical Exam: Temp:  [97.8 F (36.6 C)-99.1 F (37.3 C)] 97.9 F (36.6 C) (06/18 0700) Pulse Rate:  [96-114] 97 (06/18 0800) Resp:  [17-21] 20 (06/18 0700) BP: (166-190)/(57-74) 188/71 (06/18 1011) SpO2:  [93 %-100 %] 100 % (06/18 0700) Weight:  [72.6 kg] 72.6 kg (06/17 2049) Last BM Date: 11/22/20  Patient is alert and she appears to be tachypneic Conjunctiva is pink.  Sclera is nonicteric. Oropharyngeal mucosa is normal.  She has upper dental plate and few of her own teeth in lower jaw. JVD is elevated. No thyromegaly or lymphadenopathy. Cardiac exam with irregular rhythm normal S1 and S2.  No murmur or gallop noted. Auscultation lungs reveal diminished intensity of breath sounds bilaterally in few rales at right base. Abdomen is symmetrical.  Bowel sounds are normal.  On palpation abdomen is soft and nontender with organomegaly or masses. She has 1+ pitting edema involving both legs.  She also has Ace wrap at left leg.  Lab Results: Recent Labs    11/22/20 0000  WBC 8.3  HGB 11.9*  HCT 38.8  PLT 196   BMET Recent Labs    11/22/20 0000  NA 134*  K 4.1  CL 96*  CO2 26  GLUCOSE 98  BUN 40*  CREATININE 2.12*  CALCIUM 10.6*   LFT Recent Labs    11/22/20 0000  PROT 8.1  ALBUMIN 4.7  AST 24  ALT 17  ALKPHOS 61  BILITOT 0.7   PT/INR Recent Labs    11/22/20 0001  LABPROT 18.7*  INR 1.6*     Studies/Results: DG Abdomen  Acute W/Chest  Result Date: 11/21/2020 CLINICAL DATA:  Constipation. EXAM: DG ABDOMEN ACUTE WITH 1 VIEW CHEST COMPARISON:  Chest radiograph 06/05/2020, abdominal CT 10/10/2018 FINDINGS: Dual lead left-sided pacemaker in place. Stable cardiomegaly. Aortic atherosclerosis  and tortuosity. Bilateral pleural effusions with improvement on the left but worsening on the right from prior exam. Associated bibasilar volume loss/atelectasis. Pulmonary vascular congestion, also seen on prior. Minimal fluid tracks into the right minor fissure. Bilateral shoulder arthropathy. No bowel dilatation to suggest obstruction. No free intra-abdominal air. Moderate stool in the ascending, transverse, proximal descending colon. Stool balls distending the rectum, mild rectal distention of 6.5 cm. Popcorn calcifications in the pelvis correspond to calcified fibroids. Calcifications to the right of L2-L3 correspond to right renal parenchymal calcification. Prominent vascular calcifications. No acute osseous abnormalities are seen. IMPRESSION: 1. Bilateral pleural effusions with improvement on the left and worsening on the right from December 2021 radiograph. 2. Cardiomegaly with vascular congestion. 3. Moderate stool burden, with stool ball distending the rectum, suggesting constipation and possible fecal impaction. Mild rectal distention of 6.5 cm. No evidence of bowel obstruction or free air. Electronically Signed   By: Keith Rake M.D.   On: 11/21/2020 23:19    Assessment;  Patient is 87 year old African-American female with multiple medical problems including atrial fibrillation on anticoagulant/warfarin, history of CHF, chronic kidney disease, history of CVA who has permanent pacemaker presented to emergency room with constipation and noted to have fecal impaction and she was disimpacted By Dr. Ezequiel Essex.  Patient stool noted to be heme positive and she also gave history of melena which she has been on p.o. iron for the last 3 to 4 months. Patient also noted to be in CHF and fluid overloaded which is being addressed by Dr. Shanon Brow Tat. GI issues can be summarized as below.  #1.  Fecal impaction.  This appears to be result of diminished oral intake and medications.  She will need to be  on polyethylene glycol on daily basis.  If polyethylene glycol does not work she could be switched to lubiprostone or linaclotide.  As needed use of Dulcolax suppository or fleets enema would also be helpful.  #2.  Melena.  Her stool was noted to be heme positive but that could be function of fecal impaction.  She has been on iron which would make her stools black.  Admission hemoglobin was 10.5 and it has gone up to 11.9 resulting from IV diuresis.  It does not appear that she is having significant GI bleed.  Given history of anorexia and 20 pound weight loss over a period of few months will need to rule out peptic ulcer disease.  Would consider EGD when she is stable from cardiac standpoint.  In the meantime she should be treated with IV pantoprazole as you are doing.  #3.  CHF.  She is being diuresed.  She remains with elevated JVD when she has dyspnea at rest.  #4.  Chronic atrial fibrillation.  Recommendations;  Continue pantoprazole 40 mg IV every 12 hours. Monitor H&H daily. Continue polyethylene glycol on daily basis.  Dose may have to be titrated. Use Dulcolax suppository or fleets enema if she goes more than 1 day without a bowel movement. Esophagogastroduodenoscopy will be performed when she is deemed to be stable from cardiac standpoint.   LOS: 0 days   Prophet Renwick  11/22/2020, 1:10 PM

## 2020-11-22 NOTE — Progress Notes (Addendum)
Went in to patient's room to ambulate and check O2 saturation. Patient stated she was too SOB from the echo to ambulate right now. Patient currently with an O2 saturation of 100% on 2L. Patient's O2 saturation on room air is 96%.

## 2020-11-22 NOTE — Progress Notes (Signed)
Initial Nutrition Assessment  DOCUMENTATION CODES:   Not applicable  INTERVENTION:  - will order Boost Breeze TID, each supplement provides 250 kcal and 9 grams of protein. - will order 30 ml Prosource Plus once/day, each supplement provides 100 kcal and 15 grams protein.  - will order 1 tablet multivitamin with minerals/day. - complete NFPE when feasible. - diet advancement as medically feasible.    NUTRITION DIAGNOSIS:   Inadequate oral intake related to acute illness, decreased appetite as evidenced by per patient/family report.  GOAL:   Patient will meet greater than or equal to 90% of their needs  MONITOR:   PO intake, Supplement acceptance, Diet advancement, Labs, Weight trends  REASON FOR ASSESSMENT:   Malnutrition Screening Tool  ASSESSMENT:   87 year old female with medial history of a persistent afib on warfarin, HTN, HLD, stroke, sick sinus syndrome s/p pacemaker, stage 4 CKD, CHF, and type 2 DM. she presented to the ED with constipation and nausea. She had an episode of dry heaving earlier in the week. She tried several OTC medications for constipation including mag citrate, miralax, and an enema without relief. She also reported intermittent SOB x4-5 months with DOE. Patient goes to wound clinic for venous stasis dermatitis on BLE. In the ED, CXR showed pulmonary vascular congestion with bilateral pleural effusions (R>L) and abdominal xray showed moderate stool burden.  Diet advanced to CLD today at 0812. No intakes documented.   She has not been seen by a [place] RD at any time in the past.   Weight yesterday documented as 160 lb and appears to be a stated weight. Weight on 10/21/20 was 162.5 lb which would indicate 2.5 lb weight loss (1.5% body weight) in 1 month; no significant. Weight on 08/07/20 was 166 lb, weight on 07/21/20 was 173 lb, and weight on 06/20/20 was 174 lb. This indicates 14 lb weight loss (8% body weight) in the past 5 months; significant for  time frame.  Per notes: - acute on chronic CHF - FOBT positive--GI following and plan for esophagogastroduodenoscopy when stable from cardiac standpoint - fecal impaction at time of admission -   Labs reviewed; Na: 134 mmol/l, Cl: 96 mmol/l, BUN: 40 mg/dl, creatinine: 2.12 mg/dl, Ca: 10.6 mg/dl, GFR: 22 ml/min. Medications reviewed; 60 mg IV lasix BID, 40 mg IV protonix BID, 17 g miralax BID.    NUTRITION - FOCUSED PHYSICAL EXAM:  Unable to complete at this time.   Diet Order:   Diet Order             Diet clear liquid Room service appropriate? Yes; Fluid consistency: Thin  Diet effective now                   EDUCATION NEEDS:   No education needs have been identified at this time  Skin:  Skin Assessment: Reviewed RN Assessment  Last BM:  6/18 (type 1 x1)  Height:   Ht Readings from Last 1 Encounters:  11/21/20 '5\' 3"'$  (1.6 m)    Weight:   Wt Readings from Last 1 Encounters:  11/21/20 72.6 kg     Estimated Nutritional Needs:  Kcal:  1300-1500 kcal Protein:  65-75 grams Fluid:  >/= 1.8 L/day      Jarome Matin, MS, RD, LDN, CNSC Inpatient Clinical Dietitian RD pager # available in [facility]  After hours/weekend pager # available in [facility]

## 2020-11-22 NOTE — ED Provider Notes (Signed)
[facility] EMERGENCY DEPARTMENT Provider Note   CSN: [id number] Arrival date & time: 11/21/20  2029     History Chief Complaint  Patient presents with   Constipation    [name] is a 87 y.o. female.  Patient with a history of atrial fibrillation on Coumadin, previous stroke, hypertension, hyperlipidemia, mitral stenosis, pacemaker in place presenting with constipation.  States she has not had a bowel for the past 6 days.  Has tried magnesium citrate and MiraLAX at home without relief.  Normally goes every day and takes stool softeners as needed but has not had any bowel movement for the past 6 days.  Nausea and dry heaving but no vomiting.  Denies chest pain or shortness of breath.  Complains of diffuse abdominal pain and rectal pain.  Her daughter attempted an enema at home without relief.  She has a small before but never this severe.  Stool has been dark and black but she does take iron as well as Coumadin.  No fevers, chills, nausea or vomiting. She has chronic leg swelling and chronic wounds on her legs which are unchanged.  The history is provided by the patient.  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 ;magnetic MRI->  multiple cerebral infarctions; Rx-ASA but subsequently changed to coumadin  when found to have rheumatic mitral valve disease carotid duplex mild plaque in 08/2008   Chronic bronchitis    Chronic renal insufficiency    baseline creatine 1.4; 1.04 in 03/2010   DJD (degenerative joint  disease)    hands, knees   Gout    Hyperlipidemia    Hypertension    heart disease diastolic dysfunction ; 123456; mild pulmonary edema in 2006;responded to diurectics ; LVH   Kidney stones    Mitral valve disease    Severe mitral annular calcification; mild to moderate stenosis-not clearly rheumatic   Nephrolithiasis    Pacemaker    Retinal hemorrhage    Seizure disorder (HCC)    Shortness of breath    only with exertion   Stroke ([place])    Tachycardia-bradycardia syndrome ([place])    with pauses and syncope;PPM implantation 10/2008,negative stress nuclear study 03/2005   Vertigo     Patient Active Problem List   Diagnosis Date Noted   Chronic kidney disease, stage IV (severe) ([place]) 10/21/2020   Blister 07/10/2020   Stage 3b chronic kidney disease ([place]) 05/05/2020   Aortic atherosclerosis ([place]) 05/05/2020   Acute congestive heart failure ([place]) 08/28/2019   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   Lack of appetite 10/23/2018   Thyroid nodule 10/04/2017   Right sided weakness    Acute ischemic stroke ([place]) 09/20/2017   Controlled type 2 diabetes mellitus with complication, without long-term current use of insulin ([place]) 07/08/2016   Prediabetes 12/25/2015   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   TIA (transient ischemic attack) 08/20/2013   Encounter for therapeutic drug monitoring 06/28/2013   Gout 11/14/2012   Fasting hyperglycemia 04/06/2012   Atrial fibrillation (  [place]) 08/31/2011   Chronic anticoagulation 12/16/2010   PPM-St[employer]Jude 11/17/2010   Cerebrovascular accident [facility])    DJD (degenerative joint disease)    Hypertension 02/21/2009   Sick sinus syndrome ([place]) 02/21/2009   Hyperlipidemia 11/26/2008    Past Surgical History:  Procedure Laterality Date   CATARACT EXTRACTION W/PHACO  03/29/2011   Procedure: CATARACT EXTRACTION PHACO AND INTRAOCULAR LENS PLACEMENT ([place]);  Surgeon: Tonny Branch;  Location: [facility];  Service: Ophthalmology;  Laterality: Right;  CDE 12.62    INSERT / REPLACE / REMOVE PACEMAKER  11/2008   St[employer]Jude DUAL chamber pacemaker 11/06/2008   MEMBRANE PEEL Right 02/12/2014   Procedure: MEMBRANE PEEL;  Surgeon: [name], MD;  Location: [place];  Service: Ophthalmology;  Laterality: Right;   PARS PLANA VITRECTOMY Right 02/12/2014   Procedure: PARS PLANA VITRECTOMY WITH 25 GAUGE;  Surgeon: [name], MD;  Location: [place];  Service: Ophthalmology;  Laterality: Right;   PPM GENERATOR CHANGEOUT N/A 03/17/2020   Procedure: PPM GENERATOR CHANGEOUT;  Surgeon: Deboraha Sprang, MD;  Location: [place] CV LAB;  Service: Cardiovascular;  Laterality: N/A;   TOTAL KNEE ARTHROPLASTY  04/2009   Right     OB History     Gravida  5   Para  5   Term  5   Preterm      AB      Living  5      SAB      IAB      Ectopic      Multiple      Live Births              Family History  Problem Relation Age of Onset   Cancer Other        unknown cancer   Hypotension Neg Hx    Anesthesia problems Neg Hx    Pseudochol deficiency Neg Hx    Malignant hyperthermia Neg Hx     Social History   Tobacco Use   Smoking status: Never   Smokeless tobacco: Never  Vaping Use   Vaping Use: Never used  Substance Use Topics   Alcohol use: No    Alcohol/week: 0.0 standard drinks   Drug use: No    Home Medications Prior to Admission medications   Medication Sig Start Date End Date Taking? Authorizing Provider  albuterol (VENTOLIN HFA) 108 (90 Base) MCG/ACT inhaler TAKE 2 PUFFS EVERY 6 HOURS AS NEEDED 10/23/20   Kathyrn Drown, MD  allopurinol (ZYLOPRIM) 100 MG tablet TAKE 1/2 TABLET BY MOUTH ON MONDAY AND FRIDAY 05/05/20   Kathyrn Drown, MD  amLODipine (NORVASC) 5 MG tablet Take 1 tablet (5 mg total) by mouth daily. 04/02/20   Kathyrn Drown, MD  diltiazem (TIAZAC) 360 MG 24 hr capsule TAKE ONE CAPSULE BY MOUTH ONCE DAILY. 09/29/20   Kathyrn Drown, MD  ferrous sulfate 324 (65 Fe) MG TBEC Take 1 tablet by mouth daily.    [provider]  meclizine (ANTIVERT) 25 MG tablet Take 1 tablet (25 mg total) by mouth 2 (two) times daily as needed for dizziness. 05/06/20   Kathyrn Drown, MD  mupirocin ointment (BACTROBAN) 2 % Apply thin amount daily to skin blister 07/14/20   Kathyrn Drown, MD  pravastatin (PRAVACHOL) 80 MG tablet TAKE (1) TABLET BY MOUTH AT BEDTIME. 05/21/20   Kathyrn Drown, MD  torsemide (DEMADEX) 20 MG tablet TAKE 3 TABLETS BY MOUTH DAILY 10/29/20   Sallee Lange  A, MD  Travoprost, BAK Free, (TRAVATAN) 0.004 % SOLN ophthalmic solution Place 1 drop into both eyes at bedtime.     [provider]  VITAMIN D, CHOLECALCIFEROL, PO Take 1 tablet by mouth every morning.    [provider]  warfarin (COUMADIN) 5 MG tablet TAKE 1/2 TO 1 TABLET BY MOUTH DAILY OR AS DIRECTED. 04/21/20   Evans Lance, MD    Allergies    Tramadol  Review of Systems   Review of Systems  Constitutional:  Negative for activity change, appetite change and fever.  HENT:  Negative for congestion and rhinorrhea.   Respiratory:  Negative for cough, chest tightness and shortness of breath.   Cardiovascular:  Negative for chest pain.  Gastrointestinal:  Positive for abdominal pain, constipation and nausea. Negative for vomiting.  Genitourinary:  Negative for dysuria and hematuria.  Musculoskeletal:  Negative for arthralgias and myalgias.  Skin:  Negative for rash.  Neurological:  Negative for dizziness, weakness and headaches.   all other systems are negative except as noted in the HPI and PMH.   Physical Exam Updated Vital Signs BP (!) 190/73 (BP Location: Right Arm)   Pulse 96   Temp 99.1 F (37.3 C) (Oral)   Resp 17   Ht '5\' 3"'$  (1.6 m)   Wt 72.6 kg   SpO2 95%   BMI 28.34 kg/m   Physical Exam Vitals and nursing note reviewed.  Constitutional:      General: She is not in acute distress.    Appearance: She is well-developed.  HENT:     Head: Normocephalic and atraumatic.     Mouth/Throat:      Pharynx: No oropharyngeal exudate.  Eyes:     Conjunctiva/sclera: Conjunctivae normal.     Pupils: Pupils are equal, round, and reactive to light.  Neck:     Comments: No meningismus. Cardiovascular:     Rate and Rhythm: Normal rate and regular rhythm.     Heart sounds: Normal heart sounds. No murmur heard. Pulmonary:     Effort: Pulmonary effort is normal. No respiratory distress.     Breath sounds: Rales present.     Comments: Bibasilar crackle Abdominal:     Palpations: Abdomen is soft.     Tenderness: There is abdominal tenderness. There is no guarding or rebound.     Comments: Mild diffuse tenderness, no guarding or rebound  Genitourinary:    Comments: Fecal impaction on rectal exam with multiple hard black stool balls removed manually.  No gross blood Musculoskeletal:        General: No tenderness. Normal range of motion.     Cervical back: Normal range of motion and neck supple.  Skin:    General: Skin is warm.  Neurological:     Mental Status: She is alert and oriented to person, place, and time.     Cranial Nerves: No cranial nerve deficit.     Motor: No abnormal muscle tone.     Coordination: Coordination normal.     Comments:  5/5 strength throughout. CN 2-12 intact.Equal grip strength.   Psychiatric:        Behavior: Behavior normal.    ED Results / Procedures / Treatments   Labs (all labs ordered are listed, but only abnormal results are displayed) Labs Reviewed  CBC WITH DIFFERENTIAL/PLATELET - Abnormal; Notable for the following components:      Result Value   Hemoglobin 11.9 (*)    All other components within normal limits  COMPREHENSIVE  METABOLIC PANEL - Abnormal; Notable for the following components:   Sodium 134 (*)    Chloride 96 (*)    BUN 40 (*)    Creatinine, Ser 2.12 (*)    Calcium 10.6 (*)    GFR, Estimated 22 (*)    All other components within normal limits  LIPASE, BLOOD - Abnormal; Notable for the following components:   Lipase 57 (*)     All other components within normal limits  PROTIME-INR - Abnormal; Notable for the following components:   Prothrombin Time 18.7 (*)    INR 1.6 (*)    All other components within normal limits  POC OCCULT BLOOD, ED - Abnormal; Notable for the following components:   Fecal Occult Bld POSITIVE (*)    All other components within normal limits  URINALYSIS, ROUTINE W REFLEX MICROSCOPIC  BRAIN NATRIURETIC PEPTIDE  TYPE AND SCREEN  TROPONIN I (HIGH SENSITIVITY)    EKG EKG Interpretation  Date/Time:  Saturday November 22 2020 01:36:56 EDT Ventricular Rate:  107 PR Interval:    QRS Duration: 83 QT Interval:  348 QTC Calculation: 465 R Axis:   -20 Text Interpretation: Atrial fibrillation Anterior infarct, old Rate faster Confirmed by Ezequiel Essex [phone]) on 11/22/2020 1:57:10 AM  Radiology DG Abdomen Acute W/Chest  Result Date: 11/21/2020 CLINICAL DATA:  Constipation. EXAM: DG ABDOMEN ACUTE WITH 1 VIEW CHEST COMPARISON:  Chest radiograph 06/05/2020, abdominal CT 10/10/2018 FINDINGS: Dual lead left-sided pacemaker in place. Stable cardiomegaly. Aortic atherosclerosis and tortuosity. Bilateral pleural effusions with improvement on the left but worsening on the right from prior exam. Associated bibasilar volume loss/atelectasis. Pulmonary vascular congestion, also seen on prior. Minimal fluid tracks into the right minor fissure. Bilateral shoulder arthropathy. No bowel dilatation to suggest obstruction. No free intra-abdominal air. Moderate stool in the ascending, transverse, proximal descending colon. Stool balls distending the rectum, mild rectal distention of 6.5 cm. Popcorn calcifications in the pelvis correspond to calcified fibroids. Calcifications to the right of L2-L3 correspond to right renal parenchymal calcification. Prominent vascular calcifications. No acute osseous abnormalities are seen. IMPRESSION: 1. Bilateral pleural effusions with improvement on the left and worsening on the right  from December 2021 radiograph. 2. Cardiomegaly with vascular congestion. 3. Moderate stool burden, with stool ball distending the rectum, suggesting constipation and possible fecal impaction. Mild rectal distention of 6.5 cm. No evidence of bowel obstruction or free air. Electronically Signed   By: Keith Rake M.D.   On: 11/21/2020 23:19    Procedures Fecal disimpaction  Date/Time: 11/22/2020 12:04 AM Performed by: Ezequiel Essex, MD Authorized by: Ezequiel Essex, MD  Consent: Verbal consent obtained. Risks and benefits: risks, benefits and alternatives were discussed Consent given by: patient Patient understanding: patient states understanding of the procedure being performed Patient consent: the patient's understanding of the procedure matches consent given Procedure consent: procedure consent matches procedure scheduled Relevant documents: relevant documents present and verified Test results: test results available and properly labeled Site marked: the operative site was marked Imaging studies: imaging studies available Patient identity confirmed: verbally with patient Time out: Immediately prior to procedure a "time out" was called to verify the correct patient, procedure, equipment, support staff and site/side marked as required. Local anesthesia used: no  Anesthesia: Local anesthesia used: no  Sedation: Patient sedated: no  Patient tolerance: patient tolerated the procedure well with no immediate complications     Medications Ordered in ED Medications - No data to display  ED Course  I have reviewed the triage vital  signs and the nursing notes.  Pertinent labs & imaging results that were available during my care of the patient were reviewed by me and considered in my medical decision making (see chart for details).    MDM Rules/Calculators/A&P                         Fecal impaction and constipation. Abdomen soft without peritoneal signs.  X-ray shows  worsening right-sided pleural effusion.  Does show fecal impaction without evidence of bowel obstruction  Hemoccult is positive.  Patient does have dark tarry black stools and fecal impaction. Hemoglobin is stable.  Creatinine improved from baseline  Hemoglobin 11.9 which is improved from previous.  Creatinine is improved from previous.  INR 1.6.  No EGDs or colonoscopy in the system.  Vitals remained stable.  Mild RVR with atrial fibrillation.  Concerning that patient is having melena in the setting of Coumadin use.  Will plan observation admission given her melena and Coumadin use.  Vital signs remained stable.  Cardizem given for atrial fibrillation with RVR.  Chest x-ray shows some worsening of her pleural effusions bilaterally.  Does have a history of heart failure and aortic stenosis.  She does take torsemide at home.  Dose of IV Lasix given.  She has no respiratory distress and no shortness of breath.  Creatinine improved from baseline.  Admission discussed with Dr. Josephine Cables.  Final Clinical Impression(s) / ED Diagnoses Final diagnoses:  Fecal impaction ([place])  Melena  Acute on chronic diastolic congestive heart failure [facility])    Rx / DC Orders ED Discharge Orders     None        Norah Devin, Annie Main, MD 11/22/20 [phone]

## 2020-11-22 NOTE — Progress Notes (Signed)
PROGRESS NOTE  [name] [id number] DOB: [birth year] DOA: 11/21/2020 PCP: Kathyrn Drown, MD  Brief History:  87 year old female with a history of a persistent atrial fibrillation on warfarin, hypertension, hyperlipidemia, stroke, sick sinus syndrome status post pacemaker, CKD stage IV, diastolic CHF, diabetes mellitus type 2, stroke presenting with constipation and nausea.  Apparently, patient had an episode of dry heaving earlier in the week.  The patient states that she has tried numerous over-the-counter remedies to help her constipation including mag citrate, MiraLAX, and enema without success.  In addition, the patient has also noted some melanotic stools.  She denies any NSAID use. Upon further questioning, the patient states that she has had some intermittent shortness of breath for the better part of the last 4 to 5 months.  She states that it has not really changed, but she does have dyspnea on exertion.  She endorses some orthopnea type symptoms.  She states that her lower extremity edema is about the same as usual.  She has been going to the wound care center for her venous stasis dermatitis and leg wound she denies any fevers, chills, chest pain, coughing, hemoptysis, hematochezia, dysuria, hematuria. In the emergency department, the patient had low-grade temperature of 99 1 F.  She was hemodynamically stable she was noted to have atrial fibrillation with RVR.  The patient was placed on 2 L nasal cannula.  Chest x-ray showed pulmonary vascular congestion with bilateral pleural effusions, R>left.  Abdominal x-ray showed moderate stool with the rectum distended with stool ball.  Assessment/Plan: Acute on chronic diastolic CHF -Patient remains clinically fluid overloaded -Continue IV furosemide 60 mg twice daily -Echocardiogram -Accurate I's and O's -Daily weights  FOBT positive/melena -Holding warfarin -GI consult -Hemoglobin remained stable -Continue  PPI  Fecal impaction -Patient states that she had a BM on the evening of 11/21/2020 -GI consult -Continue MiraLAX--increased to twice daily  Persistent atrial fibrillation -Continue Cardizem CD -Holding warfarin secondary to concerns of GI bleed  CKD stage IV -Baseline creatinine 2.1-2.4 -Monitor with diuresis  Tachybradycardia syndrome -Status post permanent pacemaker 2010 -Patient follows with Dr. Caryl Comes  Essential hypertension -Continue diltiazem CD  Hyperlipidemia -Continue statin       Status is: Observation  The patient will require care spanning > 2 midnights and should be moved to inpatient because: Ongoing diagnostic testing needed not appropriate for outpatient work up  Dispo: The patient is from: Home              Anticipated d/c is to: Home              Patient currently is not medically stable to d/c.   Difficult to place patient No        Family Communication:   daughter updated 6/18  Consultants:  GI  Code Status:  FULL   DVT Prophylaxis:  warfarin   Procedures: As Listed in Progress Note Above  Antibiotics: None      Subjective:  Patient complains of shortness of breath.  She denies any chest pain, abdominal pain, headaches, fever, chills, nausea, vomiting, diarrhea there is no dysuria or hematuria Objective: Vitals:   11/22/20 0330 11/22/20 0447 11/22/20 0700 11/22/20 0800  BP: (!) 173/66 (!) 166/74 (!) 180/61   Pulse: (!) 104 100 (!) 114 97  Resp:  (!) 21 20   Temp:  97.8 F (36.6 C) 97.9 F (36.6 C)   TempSrc:  Oral Oral  SpO2: 96% 100% 100%   Weight:      Height:       No intake or output data in the 24 hours ending 11/22/20 0926 Weight change:  Exam:  General:  Pt is alert, follows commands appropriately, not in acute distress HEENT: No icterus, No thrush, No neck mass, [place]/AT Cardiovascular: IRRR, S1/S2, no rubs, no gallops Respiratory: Bilateral rales; no wheezing Abdomen: Soft/+BS, non tender, non  distended, no guarding Extremities: 2+LE  edema, No lymphangitis, No petechiae, No rashes, no synovitis   Data Reviewed: I have personally reviewed following labs and imaging studies Basic Metabolic Panel: Recent Labs  Lab 11/22/20 0000 11/22/20 0316  NA 134*  --   K 4.1  --   CL 96*  --   CO2 26  --   GLUCOSE 98  --   BUN 40*  --   CREATININE 2.12*  --   CALCIUM 10.6*  --   MG  --  2.2  PHOS  --  2.7   Liver Function Tests: Recent Labs  Lab 11/22/20 0000  AST 24  ALT 17  ALKPHOS 61  BILITOT 0.7  PROT 8.1  ALBUMIN 4.7   Recent Labs  Lab 11/22/20 0000  LIPASE 57*   No results for input(s): AMMONIA in the last 168 hours. Coagulation Profile: Recent Labs  Lab 11/22/20 0001  INR 1.6*   CBC: Recent Labs  Lab 11/22/20 0000  WBC 8.3  NEUTROABS 6.0  HGB 11.9*  HCT 38.8  MCV 90.4  PLT 196   Cardiac Enzymes: No results for input(s): CKTOTAL, CKMB, CKMBINDEX, TROPONINI in the last 168 hours. BNP: Invalid input(s): POCBNP CBG: No results for input(s): GLUCAP in the last 168 hours. HbA1C: No results for input(s): HGBA1C in the last 72 hours. Urine analysis:    Component Value Date/Time   COLORURINE STRAW (A) 11/22/2020 0050   APPEARANCEUR CLEAR 11/22/2020 0050   LABSPEC 1.005 11/22/2020 0050   PHURINE 7.0 11/22/2020 0050   GLUCOSEU NEGATIVE 11/22/2020 0050   HGBUR MODERATE (A) 11/22/2020 0050   BILIRUBINUR NEGATIVE 11/22/2020 0050   KETONESUR NEGATIVE 11/22/2020 0050   PROTEINUR NEGATIVE 11/22/2020 0050   UROBILINOGEN 1.0 07/11/2014 2325   NITRITE NEGATIVE 11/22/2020 0050   LEUKOCYTESUR NEGATIVE 11/22/2020 0050   Sepsis Labs: '@[email or web address]'$ ([id number]) ) Recent Results (from the past 240 hour(s))  Resp Panel by RT-PCR (Flu A&B, Covid) Nasopharyngeal Swab     Status: None   Collection Time: 11/22/20  2:25 AM   Specimen: Nasopharyngeal Swab; Nasopharyngeal(NP) swabs in vial transport medium  Result Value Ref Range Status   SARS  Coronavirus 2 by RT PCR NEGATIVE NEGATIVE Final    Comment: (NOTE) SARS-CoV-2 target nucleic acids are NOT DETECTED.  The SARS-CoV-2 RNA is generally detectable in upper respiratory specimens during the acute phase of infection. The lowest concentration of SARS-CoV-2 viral copies this assay can detect is 138 copies/mL. A negative result does not preclude SARS-Cov-2 infection and should not be used as the sole basis for treatment or other patient management decisions. A negative result may occur with  improper specimen collection/handling, submission of specimen other than nasopharyngeal swab, presence of viral mutation(s) within the areas targeted by this assay, and inadequate number of viral copies(<138 copies/mL). A negative result must be combined with clinical observations, patient history, and epidemiological information. The expected result is Negative.  Fact Sheet for Patients:  EntrepreneurPulse.com.au  Fact Sheet for Healthcare Providers:  IncredibleEmployment.be  This test is no t yet approved or  cleared by the [place] and  has been authorized for detection and/or diagnosis of SARS-CoV-2 by FDA under an Emergency Use Authorization (EUA). This EUA will remain  in effect (meaning this test can be used) for the duration of the COVID-19 declaration under Section 564(b)(1) of the Act, 21 U.S.C.section 360bbb-3(b)(1), unless the authorization is terminated  or revoked sooner.       Influenza A by PCR NEGATIVE NEGATIVE Final   Influenza B by PCR NEGATIVE NEGATIVE Final    Comment: (NOTE) The Xpert Xpress SARS-CoV-2/FLU/RSV plus assay is intended as an aid in the diagnosis of influenza from Nasopharyngeal swab specimens and should not be used as a sole basis for treatment. Nasal washings and aspirates are unacceptable for Xpert Xpress SARS-CoV-2/FLU/RSV testing.  Fact Sheet for  Patients: EntrepreneurPulse.com.au  Fact Sheet for Healthcare Providers: IncredibleEmployment.be  This test is not yet approved or cleared by the [place] FDA and has been authorized for detection and/or diagnosis of SARS-CoV-2 by FDA under an Emergency Use Authorization (EUA). This EUA will remain in effect (meaning this test can be used) for the duration of the COVID-19 declaration under Section 564(b)(1) of the Act, 21 U.S.C. section 360bbb-3(b)(1), unless the authorization is terminated or revoked.  Performed at [facility], [address], [place], [place] [address]      Scheduled Meds:  furosemide  40 mg Intravenous BID   pantoprazole (PROTONIX) IV  40 mg Intravenous Q12H   polyethylene glycol  17 g Oral Daily   Continuous Infusions:  Procedures/Studies: DG Abdomen Acute W/Chest  Result Date: 11/21/2020 CLINICAL DATA:  Constipation. EXAM: DG ABDOMEN ACUTE WITH 1 VIEW CHEST COMPARISON:  Chest radiograph 06/05/2020, abdominal CT 10/10/2018 FINDINGS: Dual lead left-sided pacemaker in place. Stable cardiomegaly. Aortic atherosclerosis and tortuosity. Bilateral pleural effusions with improvement on the left but worsening on the right from prior exam. Associated bibasilar volume loss/atelectasis. Pulmonary vascular congestion, also seen on prior. Minimal fluid tracks into the right minor fissure. Bilateral shoulder arthropathy. No bowel dilatation to suggest obstruction. No free intra-abdominal air. Moderate stool in the ascending, transverse, proximal descending colon. Stool balls distending the rectum, mild rectal distention of 6.5 cm. Popcorn calcifications in the pelvis correspond to calcified fibroids. Calcifications to the right of L2-L3 correspond to right renal parenchymal calcification. Prominent vascular calcifications. No acute osseous abnormalities are seen. IMPRESSION: 1. Bilateral pleural effusions with improvement on the left and  worsening on the right from December 2021 radiograph. 2. Cardiomegaly with vascular congestion. 3. Moderate stool burden, with stool ball distending the rectum, suggesting constipation and possible fecal impaction. Mild rectal distention of 6.5 cm. No evidence of bowel obstruction or free air. Electronically Signed   By: Keith Rake M.D.   On: 11/21/2020 23:19   ECHOCARDIOGRAM COMPLETE  Result Date: 11/18/2020    ECHOCARDIOGRAM REPORT   Patient Name:   GABRIAL KOSTICK Date of Exam: 11/18/2020 Medical Rec #:  [id number]         Height:       63.0 in Accession #:    [id number]        Weight:       163.0 lb Date of Birth:  March 17, 1927         BSA:          1.773 m Patient Age:    42 years          BP:           161/64 mmHg Patient Gender: F  HR:           74 bpm. Exam Location:  [facility] Procedure: 2D Echo, Cardiac Doppler and Color Doppler Indications:    R06.00 (ICD-10-CM) - DOE (dyspnea on exertion)                 R60.0 (ICD-10-CM) - Pedal edema  History:        Patient has prior history of Echocardiogram examinations, most                 recent 09/03/2019. CHF, Pacemaker, Stroke, Mitral Valve Disease,                 Arrythmias:Atrial Flutter; Risk Factors:Hypertension, Diabetes                 and Dyslipidemia. Chronic anticoagulation,Sick sinus syndrome.  Sonographer:    Alvino Chapel RCS Referring Phys: [phone] SC[facility] A [place]  1. Left ventricular ejection fraction, by estimation, is 60 to 65%. The left ventricle has normal function. The left ventricle has no regional wall motion abnormalities. There is moderate left ventricular hypertrophy. Left ventricular diastolic parameters are indeterminate.  2. Right ventricular systolic function is low normal. The right ventricular size is mildly enlarged. There is severely elevated pulmonary artery systolic pressure.  3. Left atrial size was severely dilated.  4. Right atrial size was severely dilated.  5. A small pericardial effusion  is present. The pericardial effusion is circumferential.  6. The mitral valve is abnormal. Mild mitral valve regurgitation. Moderate mitral stenosis. Severe mitral annular calcification.  7. The tricuspid valve is abnormal. Tricuspid valve regurgitation is moderate.  8. The aortic valve is tricuspid. There is moderate calcification of the aortic valve. There is moderate thickening of the aortic valve. Aortic valve regurgitation is not visualized. Mild to moderate aortic valve stenosis.  9. The inferior vena cava is normal in size with greater than 50% respiratory variability, suggesting right atrial pressure of 3 mmHg. FINDINGS  Left Ventricle: Left ventricular ejection fraction, by estimation, is 60 to 65%. The left ventricle has normal function. The left ventricle has no regional wall motion abnormalities. The left ventricular internal cavity size was normal in size. There is  moderate left ventricular hypertrophy. Left ventricular diastolic parameters are indeterminate. Right Ventricle: The right ventricular size is mildly enlarged. Right vetricular wall thickness was not assessed. Right ventricular systolic function is low normal. There is severely elevated pulmonary artery systolic pressure. The tricuspid regurgitant velocity is 3.89 m/s, and with an assumed right atrial pressure of 8 mmHg, the estimated right ventricular systolic pressure is 0000000 mmHg. Left Atrium: Left atrial size was severely dilated. Right Atrium: Right atrial size was severely dilated. Pericardium: A small pericardial effusion is present. The pericardial effusion is circumferential. Mitral Valve: The mitral valve is abnormal. There is moderate thickening of the mitral valve leaflet(s). There is moderate calcification of the mitral valve leaflet(s). Severe mitral annular calcification. Mild mitral valve regurgitation. Moderate mitral  valve stenosis. MV peak gradient, 30.2 mmHg. The mean mitral valve gradient is 6.0 mmHg. Tricuspid Valve:  The tricuspid valve is abnormal. Tricuspid valve regurgitation is moderate . No evidence of tricuspid stenosis. Aortic Valve: The aortic valve is tricuspid. There is moderate calcification of the aortic valve. There is moderate thickening of the aortic valve. There is moderate aortic valve annular calcification. Aortic valve regurgitation is not visualized. Mild to moderate aortic stenosis is present. Aortic valve mean gradient measures 12.0 mmHg. Aortic valve peak gradient measures  19.7 mmHg. Aortic valve area, by VTI measures 1.19 cm. Pulmonic Valve: The pulmonic valve was not well visualized. Pulmonic valve regurgitation is mild. No evidence of pulmonic stenosis. Aorta: The aortic root is normal in size and structure. Pulmonary Artery: Severe pulmonary HTN, PASP is 68 mmHg. Venous: The inferior vena cava is normal in size with greater than 50% respiratory variability, suggesting right atrial pressure of 3 mmHg. IAS/Shunts: No atrial level shunt detected by color flow Doppler. Additional Comments: A device lead is visualized.  LEFT VENTRICLE PLAX 2D LVIDd:         3.80 cm LVIDs:         1.90 cm LV PW:         1.40 cm LV IVS:        1.30 cm LVOT diam:     1.80 cm LV SV:         66 LV SV Index:   37 LVOT Area:     2.54 cm  RIGHT VENTRICLE RV S prime:     8.05 cm/s TAPSE (M-mode): 1.5 cm LEFT ATRIUM              Index       RIGHT ATRIUM           Index LA diam:        5.10 cm  2.88 cm/m  RA Area:     31.30 cm LA Vol (A2C):   114.0 ml 64.31 ml/m RA Volume:   94.60 ml  53.37 ml/m LA Vol (A4C):   96.5 ml  54.44 ml/m LA Biplane Vol: 109.0 ml 61.49 ml/m  AORTIC VALVE AV Area (Vmax):    1.23 cm AV Area (Vmean):   1.23 cm AV Area (VTI):     1.19 cm AV Vmax:           222.00 cm/s AV Vmean:          164.000 cm/s AV VTI:            0.554 m AV Peak Grad:      19.7 mmHg AV Mean Grad:      12.0 mmHg LVOT Vmax:         107.00 cm/s LVOT Vmean:        79.200 cm/s LVOT VTI:          0.258 m LVOT/AV VTI ratio: 0.47  AORTA  Ao Root diam: 2.70 cm MITRAL VALVE                TRICUSPID VALVE MV Area (PHT): 2.05 cm     TR Peak grad:   60.5 mmHg MV Peak grad:  30.2 mmHg    TR Vmax:        389.00 cm/s MV Mean grad:  6.0 mmHg MV Vmax:       2.75 m/s     SHUNTS MV Vmean:      92.3 cm/s    Systemic VTI:  0.26 m MV Decel Time: 370 msec     Systemic Diam: 1.80 cm MV E velocity: 268.00 cm/s Carlyle Dolly MD Electronically signed by Carlyle Dolly MD Signature Date/Time: 11/18/2020/4:09:15 PM    Final     Orson Eva, DO  [facility]  If 7PM-7AM, please contact night-coverage [email or web address] Password [email or web address] 11/22/2020, 9:26 AM   LOS: 0 days

## 2020-11-22 NOTE — H&P (Signed)
History and Physical  [name] [id number] DOB: 08/14/1926 DOA: 11/21/2020  Referring physician: Ezequiel Essex, MD  PCP: Kathyrn Drown, MD  Patient coming from: Home  Chief Complaint: Constipation  HPI: [name] is a 87 y.o. female with medical history significant for atrial fibrillation on Coumadin, hypertension, hyperlipidemia, prior CVA, pacemaker, CKD stage IV, pulmonary hypertension who presents to the emergency department via EMS with complaint of about 2 weeks of of constipation, though she states that she takes iron tablet daily, and states that she noted that her stool has been black.  She tried MiraLAX and magnesium citrate as recommended by PCP at home without relief.  She complained of diffuse abdominal pain with pain, she states that her daughter attempted an enema at home without relief.  She also complained of 2 to 3-week onset of increasing shortness of breath and increased leg swelling which was wrapped up with an Ace bandage, shortness of breath worsens on ambulation (ambulates with a cane and walker at baseline).  She denies fever, chills, chest pain, nausea or vomiting. She was seen by PCP on 6/14 where the echo done was suggestive of pulmonary artery hypertension and aortic was transferred to really increase due to paroxysmal nocturnal dyspnea and pedal edema.  ED Course:  In the emergency department, she was intermittently tachypneic and tachycardic.  BP was 136/66.  Work-up in the ED showed normocytic anemia, BUN/creatinine 40/2.12 (baseline creatinine at 2.4-2.9).  Troponin x2-23 > 30, BNP 663, urinalysis was unimpressive for UTI, lipase 57, FOBT was positive.  Influenza A, B, SARS coronavirus 2 was negative. Abdominal x-ray showed bilateral pleural effusions with improvement on the left and worsening on the right from December 2021 radiograph.  Cardiomegaly with vascular congestion noted. Fecal disimpaction was done, FOBT was positive.  She was  treated with IV Lasix 60 mg, IV Cardizem 10 mg was given.  Hospitalist was asked to admit patient for further evaluation and management.  Review of Systems: Constitutional: Negative for chills and fever.  HENT: Negative for ear pain and sore throat.   Eyes: Negative for pain and visual disturbance.  Respiratory: Positive for shortness of breath.  Negative for cough, chest tightness   Cardiovascular: Negative for chest pain and palpitations.  Gastrointestinal: Positive for abdominal pain, nausea and constipation.  Negative for vomiting.  Endocrine: Negative for polyphagia and polyuria.  Genitourinary: Negative for decreased urine volume, dysuria, enuresis Musculoskeletal: Positive for leg swelling.  Negative for arthralgias and back pain.  Skin: Negative for color change and rash.  Allergic/Immunologic: Negative for immunocompromised state.  Neurological: Negative for tremors, syncope, speech difficult Hematological: Does not bruise/bleed easily.  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 ;magnetic MRI->  multiple cerebral infarctions; Rx-ASA but subsequently changed to coumadin  when found to have rheumatic mitral valve disease carotid duplex mild plaque in 08/2008   Chronic bronchitis    Chronic renal insufficiency    baseline creatine 1.4; 1.04 in 03/2010   DJD (degenerative joint disease)    hands, knees   Gout    Hyperlipidemia  Hypertension    heart disease diastolic dysfunction ; 123456; mild pulmonary edema in 2006;responded to diurectics ; LVH   Kidney stones    Mitral valve disease     Severe mitral annular calcification; mild to moderate stenosis-not clearly rheumatic   Nephrolithiasis    Pacemaker    Retinal hemorrhage    Seizure disorder (HCC)    Shortness of breath    only with exertion   Stroke ([place])    Tachycardia-bradycardia syndrome ([place])    with pauses and syncope;PPM implantation 10/2008,negative stress nuclear study 03/2005   Vertigo    Past Surgical History:  Procedure Laterality Date   CATARACT EXTRACTION W/PHACO  03/29/2011   Procedure: CATARACT EXTRACTION PHACO AND INTRAOCULAR LENS PLACEMENT ([place]);  Surgeon: Tonny Branch;  Location: [facility];  Service: Ophthalmology;  Laterality: Right;  CDE 12.62   INSERT / REPLACE / REMOVE PACEMAKER  11/2008   [employer] DUAL chamber pacemaker 11/06/2008   MEMBRANE PEEL Right 02/12/2014   Procedure: MEMBRANE PEEL;  Surgeon: [name], MD;  Location: [place];  Service: Ophthalmology;  Laterality: Right;   PARS PLANA VITRECTOMY Right 02/12/2014   Procedure: PARS PLANA VITRECTOMY WITH 25 GAUGE;  Surgeon: [name], MD;  Location: [place];  Service: Ophthalmology;  Laterality: Right;   PPM GENERATOR CHANGEOUT N/A 03/17/2020   Procedure: PPM GENERATOR CHANGEOUT;  Surgeon: Deboraha Sprang, MD;  Location: [place] [facility];  Service: Cardiovascular;  Laterality: N/A;   TOTAL KNEE ARTHROPLASTY  04/2009   Right    Social History:  reports that she has never smoked. She has never used smokeless tobacco. She reports that she does not drink alcohol and does not use drugs.   Allergies  Allergen Reactions   Tramadol     Drowsiness    Family History  Problem Relation Age of Onset   Cancer Other        unknown cancer   Hypotension Neg Hx    Anesthesia problems Neg Hx    Pseudochol deficiency Neg Hx    Malignant hyperthermia Neg Hx       Prior to Admission medications   Medication Sig Start Date End Date Taking? Authorizing Provider  albuterol (VENTOLIN HFA) 108 (90 Base) MCG/ACT inhaler TAKE 2 PUFFS EVERY 6 HOURS AS  NEEDED 10/23/20   Kathyrn Drown, MD  allopurinol (ZYLOPRIM) 100 MG tablet TAKE 1/2 TABLET BY MOUTH ON MONDAY AND FRIDAY 05/05/20   Kathyrn Drown, MD  amLODipine (NORVASC) 5 MG tablet Take 1 tablet (5 mg total) by mouth daily. 04/02/20   Kathyrn Drown, MD  diltiazem (TIAZAC) 360 MG 24 hr capsule TAKE ONE CAPSULE BY MOUTH ONCE DAILY. 09/29/20   Kathyrn Drown, MD  ferrous sulfate 324 (65 Fe) MG TBEC Take 1 tablet by mouth daily.    [provider]  meclizine (ANTIVERT) 25 MG tablet Take 1 tablet (25 mg total) by mouth 2 (two) times daily as needed for dizziness. 05/06/20   Kathyrn Drown, MD  mupirocin ointment (BACTROBAN) 2 % Apply thin amount daily to skin blister 07/14/20   Kathyrn Drown, MD  pravastatin (PRAVACHOL) 80 MG tablet TAKE (1) TABLET BY MOUTH AT BEDTIME. 05/21/20   Kathyrn Drown, MD  torsemide (DEMADEX) 20 MG tablet TAKE 3 TABLETS BY MOUTH DAILY 10/29/20   Luking, Elayne Snare, MD  Travoprost, BAK Free, (TRAVATAN) 0.004 % SOLN ophthalmic solution Place 1 drop into both eyes at bedtime.     [provider]  VITAMIN D, CHOLECALCIFEROL, PO Take 1 tablet by mouth every morning.    [provider]  warfarin (COUMADIN) 5 MG tablet TAKE 1/2 TO 1 TABLET BY MOUTH DAILY OR AS DIRECTED. 04/21/20   Evans Lance, MD    Physical Exam: BP (!) 166/74 (BP Location: Right Arm)   Pulse 100   Temp 97.8 F (36.6 C) (Oral)   Resp (!) 21   Ht '5\' 3"'$  (1.6 m)   Wt 72.6 kg   SpO2 100%   BMI 28.34 kg/m   General: 87 y.o. year-old female well developed well nourished in no acute distress.  Alert and oriented x3. HEENT: NCAT, EOMI Neck: Supple, trachea medial Cardiovascular: Regular rate and rhythm with no rubs or gallops.  No thyromegaly or JVD noted.  No lower extremity edema. 2/4 pulses in all 4 extremities. Respiratory: Clear to auscultation with no wheezes or rales. Good inspiratory effort. Abdomen: Soft, nontender nondistended with normal bowel sounds x4  quadrants. Muskuloskeletal: No cyanosis, clubbing or edema noted bilaterally Neuro: CN II-XII intact, strength 5/5 x 4, sensation, reflexes intact Skin: No ulcerative lesions noted or rashes Psychiatry: Judgement and insight appear normal. Mood is appropriate for condition and setting          Labs on Admission:  Basic Metabolic Panel: Recent Labs  Lab 11/22/20 0000 11/22/20 0316  NA 134*  --   K 4.1  --   CL 96*  --   CO2 26  --   GLUCOSE 98  --   BUN 40*  --   CREATININE 2.12*  --   CALCIUM 10.6*  --   MG  --  2.2  PHOS  --  2.7   Liver Function Tests: Recent Labs  Lab 11/22/20 0000  AST 24  ALT 17  ALKPHOS 61  BILITOT 0.7  PROT 8.1  ALBUMIN 4.7   Recent Labs  Lab 11/22/20 0000  LIPASE 57*   No results for input(s): AMMONIA in the last 168 hours. CBC: Recent Labs  Lab 11/22/20 0000  WBC 8.3  NEUTROABS 6.0  HGB 11.9*  HCT 38.8  MCV 90.4  PLT 196   Cardiac Enzymes: No results for input(s): CKTOTAL, CKMB, CKMBINDEX, TROPONINI in the last 168 hours.  BNP (last 3 results) Recent Labs    10/21/20 1229 11/22/20 0000  BNP 566.8* 663.0*    ProBNP (last 3 results) No results for input(s): PROBNP in the last 8760 hours.  CBG: No results for input(s): GLUCAP in the last 168 hours.  Radiological Exams on Admission: DG Abdomen Acute W/Chest  Result Date: 11/21/2020 CLINICAL DATA:  Constipation. EXAM: DG ABDOMEN ACUTE WITH 1 VIEW CHEST COMPARISON:  Chest radiograph 06/05/2020, abdominal CT 10/10/2018 FINDINGS: Dual lead left-sided pacemaker in place. Stable cardiomegaly. Aortic atherosclerosis and tortuosity. Bilateral pleural effusions with improvement on the left but worsening on the right from prior exam. Associated bibasilar volume loss/atelectasis. Pulmonary vascular congestion, also seen on prior. Minimal fluid tracks into the right minor fissure. Bilateral shoulder arthropathy. No bowel dilatation to suggest obstruction. No free intra-abdominal air.  Moderate stool in the ascending, transverse, proximal descending colon. Stool balls distending the rectum, mild rectal distention of 6.5 cm. Popcorn calcifications in the pelvis correspond to calcified fibroids. Calcifications to the right of L2-L3 correspond to right renal parenchymal calcification. Prominent vascular calcifications. No acute osseous abnormalities are seen. IMPRESSION: 1. Bilateral pleural effusions with improvement on the left and worsening on the right from December 2021 radiograph. 2. Cardiomegaly  with vascular congestion. 3. Moderate stool burden, with stool ball distending the rectum, suggesting constipation and possible fecal impaction. Mild rectal distention of 6.5 cm. No evidence of bowel obstruction or free air. Electronically Signed   By: Keith Rake M.D.   On: 11/21/2020 23:19    EKG: I independently viewed the EKG done and my findings are as followed: A. fib with RVR  Assessment/Plan Present on Admission:  GI bleed  Chronic kidney disease, stage IV (severe) (HCC)  Hyperlipidemia  Hypertension  Sick sinus syndrome ([place])  Active Problems:   Hyperlipidemia   Hypertension   Sick sinus syndrome (HCC)   Constipation   Atrial fibrillation with RVR (HCC)   Pleural effusion due to CHF (congestive heart failure) (HCC)   Chronic kidney disease, stage IV (severe) (HCC)   GI bleed   Nausea   Elevated lipase   Elevated troponin   Elevated brain natriuretic peptide (BNP) level  Bilateral pleural effusion secondary to CHF exacerbation Abdominal x-ray showed bilateral pleural effusions with improvement on the left and worsening on the right from December 2021 radiograph IV Lasix 60 mg x 1 was given Continue total input/output, daily weights and fluid restriction Continue IV Lasix '40mg'$  twice daily  Continue Cardiac diet  Echocardiogram done on 814 showed LVEF of 60 to 65%, LV as no RWMA.  Pulmonary artery systolic pressure was severely elevated. Consider outpatient  cardiology follow-up  Elevated BNP BNP was 663, though patient's kidney status may also be a contributor Admit as described above  Constipation CT abdomen and pelvis showed moderate stool burden, with stool ball distending the rectum, suggesting constipation and possible fecal impaction. Mild rectal distention of 6.5 cm. No evidence of bowel obstruction or free air Disimpaction was done in the ED, enema was given and patient endorsed having some bowel movement which was black Continue MiraLAX and consider repeat enema based on symptoms. Hold iron tablets at this time, since this could contribute to patient's constipation  Questionable GI bleed Patient endorsed black stool, however, she has been on iron tablets which could also make stool black Hemoglobin is 11.9, this was 10.5 in November 2021 Continue to monitor H/H and consider consulting with GI based on findings  A. fib with RVR Patient has a history of atrial fibrillation CHA2DS2- VASc score   is = 6    Which is  equal to = 9.7% annual risk of stroke  INR was 1.6, continue diltiazem  Temporarily hold warfarin to ensure patient does not have GI bleed,  continue to monitor INR  Nausea Continue Zofran as needed  Elevated troponin possibly secondary to type II demand ischemia HS troponin- 23 > 30, patient denies chest pain Continue to trend troponin level  Elevated lipase Lipase 57, she denies any umbilical, epigastric and right upper quadrant pain Continue to monitor  CKD stage IV BUN/creatinine 40/2.12 (baseline creatinine at 2.4-2.9) Renally adjust medications, avoid nephrotoxic agents/dehydration/hypotension  Sick sinus syndrome s/p pacemaker placement Stable, last remote device check was on 09/16/2020; device was reported to be functioning normally  Essential hypertension Continue diltiazem  Hyperlipidemia Continue pravastatin  Glaucoma Continue latanoprost  Chronic bronchitis Continue Ventolin   DVT  prophylaxis: SCDs  Code Status: Full Code   Family Communication: None at bedside  Disposition Plan:  Patient is from:                        home Anticipated DC to:  SNF or family members home Anticipated DC date:               2-3 days Anticipated DC barriers:          Patient requires inpatient management due to CHF exacerbation    Consults called: None   Admission status: Inpatient     Bernadette Hoit MD [facility]  11/22/2020, 6:46 AM

## 2020-11-23 DIAGNOSIS — I4891 Unspecified atrial fibrillation: Secondary | ICD-10-CM | POA: Diagnosis not present

## 2020-11-23 DIAGNOSIS — K5641 Fecal impaction: Secondary | ICD-10-CM | POA: Diagnosis not present

## 2020-11-23 DIAGNOSIS — I5033 Acute on chronic diastolic (congestive) heart failure: Secondary | ICD-10-CM | POA: Diagnosis not present

## 2020-11-23 DIAGNOSIS — K5909 Other constipation: Secondary | ICD-10-CM

## 2020-11-23 DIAGNOSIS — N184 Chronic kidney disease, stage 4 (severe): Secondary | ICD-10-CM | POA: Diagnosis not present

## 2020-11-23 LAB — COMPREHENSIVE METABOLIC PANEL
ALT: 15 U/L (ref 0–44)
AST: 23 U/L (ref 15–41)
Albumin: 4.2 g/dL (ref 3.5–5.0)
Alkaline Phosphatase: 57 U/L (ref 38–126)
Anion gap: 11 (ref 5–15)
BUN: 45 mg/dL — ABNORMAL HIGH (ref 8–23)
CO2: 30 mmol/L (ref 22–32)
Calcium: 10.1 mg/dL (ref 8.9–10.3)
Chloride: 94 mmol/L — ABNORMAL LOW (ref 98–111)
Creatinine, Ser: 1.92 mg/dL — ABNORMAL HIGH (ref 0.44–1.00)
GFR, Estimated: 24 mL/min — ABNORMAL LOW (ref >60)
Glucose, Bld: 101 mg/dL — ABNORMAL HIGH (ref 70–99)
Potassium: 4.1 mmol/L (ref 3.5–5.1)
Sodium: 135 mmol/L (ref 135–145)
Total Bilirubin: 1.1 mg/dL (ref 0.3–1.2)
Total Protein: 7.3 g/dL (ref 6.5–8.1)

## 2020-11-23 LAB — PROTIME-INR
INR: 1.9 — ABNORMAL HIGH (ref 0.8–1.2)
Prothrombin Time: 21.4 seconds — ABNORMAL HIGH (ref 11.4–15.2)

## 2020-11-23 LAB — CBC
HCT: 36.5 % (ref 36.0–46.0)
Hemoglobin: 11.3 g/dL — ABNORMAL LOW (ref 12.0–15.0)
MCH: 28.1 pg (ref 26.0–34.0)
MCHC: 31 g/dL (ref 30.0–36.0)
MCV: 90.8 fL (ref 80.0–100.0)
Platelets: 182 10*3/uL (ref 150–400)
RBC: 4.02 MIL/uL (ref 3.87–5.11)
RDW: 13.3 % (ref 11.5–15.5)
WBC: 9.1 10*3/uL (ref 4.0–10.5)
nRBC: 0 % (ref 0.0–0.2)

## 2020-11-23 LAB — APTT: aPTT: 32 seconds (ref 24–36)

## 2020-11-23 LAB — MAGNESIUM: Magnesium: 2.2 mg/dL (ref 1.7–2.4)

## 2020-11-23 MED ORDER — FUROSEMIDE 10 MG/ML IJ SOLN
INTRAMUSCULAR | Status: AC
  Filled 2020-11-23: qty 4

## 2020-11-23 NOTE — Progress Notes (Addendum)
PROGRESS NOTE  [name] [id number] DOB: [birth year] DOA: 11/21/2020 PCP: Kathyrn Drown, MD  Brief History:  87 year old female with a history of a persistent atrial fibrillation on warfarin, hypertension, hyperlipidemia, stroke, sick sinus syndrome status post pacemaker, CKD stage IV, diastolic CHF, diabetes mellitus type 2, stroke presenting with constipation and nausea.  Apparently, patient had an episode of dry heaving earlier in the week.  The patient states that she has tried numerous over-the-counter remedies to help her constipation including mag citrate, MiraLAX, and enema without success.  In addition, the patient has also noted some melanotic stools.  She denies any NSAID use. Upon further questioning, the patient states that she has had some intermittent shortness of breath for the better part of the last 4 to 5 months.  She states that it has not really changed, but she does have dyspnea on exertion.  She endorses some orthopnea type symptoms.  She states that her lower extremity edema is about the same as usual.  She has been going to the wound care center for her venous stasis dermatitis and leg wound she denies any fevers, chills, chest pain, coughing, hemoptysis, hematochezia, dysuria, hematuria. In the emergency department, the patient had low-grade temperature of 99 1 F.  She was hemodynamically stable she was noted to have atrial fibrillation with RVR.  The patient was placed on 2 L nasal cannula.  Chest x-ray showed pulmonary vascular congestion with bilateral pleural effusions, R>left.  Abdominal x-ray showed moderate stool with the rectum distended with stool ball.   Assessment/Plan: Acute on chronic diastolic CHF -Patient remains clinically fluid overloaded -Continue IV furosemide 60 mg twice daily -remains clinically fluid overloaded -Echo EF 70-75%; mod TR and mod pulm HTN -Accurate I's and O's -Daily weights   FOBT positive/melena -Holding  warfarin -GI consult appreciated-->possible EGD 6/20 or 6/21 -Hemoglobin remained stable -Continue PPI -discussed with Dr. Laural Golden   Fecal impaction -Patient states that she had a BM on the evening of 11/21/2020 -GI consult appreciated -Continue MiraLAX--increased to twice daily   Persistent atrial fibrillation -Continue Cardizem CD -Holding warfarin secondary to concerns of GI bleed   CKD stage IV -Baseline creatinine 2.1-2.4 -Monitor with diuresis   Tachybradycardia syndrome -Status post permanent pacemaker 2010 -Patient follows with Dr. Caryl Comes   Essential hypertension -Continue diltiazem CD   Hyperlipidemia -Continue statin  Venous stasis dermatitis -change UNNA boot on right leg 6/19             Status is: Inpatient   The patient will require care spanning > 2 midnights and should be moved to inpatient because: Ongoing diagnostic testing needed not appropriate for outpatient work up   Dispo: The patient is from: Home              Anticipated d/c is to: Home              Patient currently is not medically stable to d/c.              Difficult to place patient No               Family Communication:   daughter updated 6/19   Consultants:  GI   Code Status:  FULL   DVT Prophylaxis:  warfarin     Procedures: As Listed in Progress Note Above   Antibiotics: None      Subjective: Patient denies fevers, chills, headache, chest pain, dyspnea, nausea, vomiting,  diarrhea, abdominal pain, dysuria, hematuria, hematochezia, and melena.   Objective: Vitals:   11/22/20 1416 11/22/20 2047 11/23/20 0525 11/23/20 1307  BP: 139/76 (!) 168/84 (!) 163/60 (!) 153/68  Pulse: 77 88 77 78  Resp: '18 20 18 18  '$ Temp: 98.1 F (36.7 C) 98.5 F (36.9 C) (!) 97.3 F (36.3 C) 98.8 F (37.1 C)  TempSrc: Oral Oral    SpO2: 96% 100% 97% 100%  Weight:      Height:        Intake/Output Summary (Last 24 hours) at 11/23/2020 1802 Last data filed at 11/23/2020  1309 Gross per 24 hour  Intake 420 ml  Output 400 ml  Net 20 ml   Weight change:  Exam:  General:  Pt is alert, follows commands appropriately, not in acute distress HEENT: No icterus, No thrush, No neck mass, [place]/AT Cardiovascular: RRR, S1/S2, no rubs, no gallops +JVD Respiratory: bibasilar rales. No wheeze Abdomen: Soft/+BS, non tender, non distended, no guarding Extremities: 1+ LE edema, No lymphangitis, No petechiae, No rashes, no synovitis   Data Reviewed: I have personally reviewed following labs and imaging studies Basic Metabolic Panel: Recent Labs  Lab 11/22/20 0000 11/22/20 0316 11/23/20 0634  NA 134*  --  135  K 4.1  --  4.1  CL 96*  --  94*  CO2 26  --  30  GLUCOSE 98  --  101*  BUN 40*  --  45*  CREATININE 2.12*  --  1.92*  CALCIUM 10.6*  --  10.1  MG  --  2.2 2.2  PHOS  --  2.7  --    Liver Function Tests: Recent Labs  Lab 11/22/20 0000 11/23/20 0634  AST 24 23  ALT 17 15  ALKPHOS 61 57  BILITOT 0.7 1.1  PROT 8.1 7.3  ALBUMIN 4.7 4.2   Recent Labs  Lab 11/22/20 0000  LIPASE 57*   No results for input(s): AMMONIA in the last 168 hours. Coagulation Profile: Recent Labs  Lab 11/22/20 0001 11/23/20 0634  INR 1.6* 1.9*   CBC: Recent Labs  Lab 11/22/20 0000 11/23/20 0634  WBC 8.3 9.1  NEUTROABS 6.0  --   HGB 11.9* 11.3*  HCT 38.8 36.5  MCV 90.4 90.8  PLT 196 182   Cardiac Enzymes: No results for input(s): CKTOTAL, CKMB, CKMBINDEX, TROPONINI in the last 168 hours. BNP: Invalid input(s): POCBNP CBG: No results for input(s): GLUCAP in the last 168 hours. HbA1C: No results for input(s): HGBA1C in the last 72 hours. Urine analysis:    Component Value Date/Time   COLORURINE STRAW (A) 11/22/2020 0050   APPEARANCEUR CLEAR 11/22/2020 0050   LABSPEC 1.005 11/22/2020 0050   PHURINE 7.0 11/22/2020 0050   GLUCOSEU NEGATIVE 11/22/2020 0050   HGBUR MODERATE (A) 11/22/2020 0050   BILIRUBINUR NEGATIVE 11/22/2020 0050   KETONESUR  NEGATIVE 11/22/2020 0050   PROTEINUR NEGATIVE 11/22/2020 0050   UROBILINOGEN 1.0 07/11/2014 2325   NITRITE NEGATIVE 11/22/2020 0050   LEUKOCYTESUR NEGATIVE 11/22/2020 0050   Sepsis Labs: '@LABRCNTIP'$ (procalcitonin:4,lacticidven:4) ) Recent Results (from the past 240 hour(s))  Resp Panel by RT-PCR (Flu A&B, Covid) Nasopharyngeal Swab     Status: None   Collection Time: 11/22/20  2:25 AM   Specimen: Nasopharyngeal Swab; Nasopharyngeal(NP) swabs in vial transport medium  Result Value Ref Range Status   SARS Coronavirus 2 by RT PCR NEGATIVE NEGATIVE Final    Comment: (NOTE) SARS-CoV-2 target nucleic acids are NOT DETECTED.  The SARS-CoV-2 RNA is generally detectable  in upper respiratory specimens during the acute phase of infection. The lowest concentration of SARS-CoV-2 viral copies this assay can detect is 138 copies/mL. A negative result does not preclude SARS-Cov-2 infection and should not be used as the sole basis for treatment or other patient management decisions. A negative result may occur with  improper specimen collection/handling, submission of specimen other than nasopharyngeal swab, presence of viral mutation(s) within the areas targeted by this assay, and inadequate number of viral copies(<138 copies/mL). A negative result must be combined with clinical observations, patient history, and epidemiological information. The expected result is Negative.  Fact Sheet for Patients:  EntrepreneurPulse.com.au  Fact Sheet for Healthcare Providers:  IncredibleEmployment.be  This test is no t yet approved or cleared by the [place] FDA and  has been authorized for detection and/or diagnosis of SARS-CoV-2 by FDA under an Emergency Use Authorization (EUA). This EUA will remain  in effect (meaning this test can be used) for the duration of the COVID-19 declaration under Section 564(b)(1) of the Act, 21 U.S.C.section 360bbb-3(b)(1), unless the  authorization is terminated  or revoked sooner.       Influenza A by PCR NEGATIVE NEGATIVE Final   Influenza B by PCR NEGATIVE NEGATIVE Final    Comment: (NOTE) The Xpert Xpress SARS-CoV-2/FLU/RSV plus assay is intended as an aid in the diagnosis of influenza from Nasopharyngeal swab specimens and should not be used as a sole basis for treatment. Nasal washings and aspirates are unacceptable for Xpert Xpress SARS-CoV-2/FLU/RSV testing.  Fact Sheet for Patients: EntrepreneurPulse.com.au  Fact Sheet for Healthcare Providers: IncredibleEmployment.be  This test is not yet approved or cleared by the [place] FDA and has been authorized for detection and/or diagnosis of SARS-CoV-2 by FDA under an Emergency Use Authorization (EUA). This EUA will remain in effect (meaning this test can be used) for the duration of the COVID-19 declaration under Section 564(b)(1) of the Act, 21 U.S.C. section 360bbb-3(b)(1), unless the authorization is terminated or revoked.  Performed at [facility], [address] [place], [place] [address]      Scheduled Meds:  (feeding supplement) PROSource Plus  30 mL Oral Daily   diltiazem  360 mg Oral Daily   feeding supplement  1 Container Oral TID BM   furosemide       furosemide  60 mg Intravenous BID   latanoprost  1 drop Both Eyes QHS   multivitamin with minerals  1 tablet Oral Daily   pantoprazole (PROTONIX) IV  40 mg Intravenous Q12H   polyethylene glycol  17 g Oral BID   pravastatin  80 mg Oral Daily   Continuous Infusions:  Procedures/Studies: DG Abdomen Acute W/Chest  Result Date: 11/21/2020 CLINICAL DATA:  Constipation. EXAM: DG ABDOMEN ACUTE WITH 1 VIEW CHEST COMPARISON:  Chest radiograph 06/05/2020, abdominal CT 10/10/2018 FINDINGS: Dual lead left-sided pacemaker in place. Stable cardiomegaly. Aortic atherosclerosis and tortuosity. Bilateral pleural effusions with improvement on the left but  worsening on the right from prior exam. Associated bibasilar volume loss/atelectasis. Pulmonary vascular congestion, also seen on prior. Minimal fluid tracks into the right minor fissure. Bilateral shoulder arthropathy. No bowel dilatation to suggest obstruction. No free intra-abdominal air. Moderate stool in the ascending, transverse, proximal descending colon. Stool balls distending the rectum, mild rectal distention of 6.5 cm. Popcorn calcifications in the pelvis correspond to calcified fibroids. Calcifications to the right of L2-L3 correspond to right renal parenchymal calcification. Prominent vascular calcifications. No acute osseous abnormalities are seen. IMPRESSION: 1. Bilateral pleural effusions with  improvement on the left and worsening on the right from December 2021 radiograph. 2. Cardiomegaly with vascular congestion. 3. Moderate stool burden, with stool ball distending the rectum, suggesting constipation and possible fecal impaction. Mild rectal distention of 6.5 cm. No evidence of bowel obstruction or free air. Electronically Signed   By: Keith Rake M.D.   On: 11/21/2020 23:19   ECHOCARDIOGRAM COMPLETE  Result Date: 11/22/2020    ECHOCARDIOGRAM REPORT   Patient Name:   [name] Date of Exam: 11/22/2020 Medical Rec #:  [id number]         Height:       63.0 in Accession #:    [id number]        Weight:       160.0 lb Date of Birth:  1926/09/26         BSA:          1.759 m Patient Age:    46 years          BP:           188/71 mmHg Patient Gender: F                 HR:           86 bpm. Exam Location:  [facility] Procedure: 2D Echo, Color Doppler and Cardiac Doppler Indications:    CHF-Acute Diastolic [id number]  History:        Patient has prior history of Echocardiogram examinations, most                 recent 11/18/2020. Stroke, Mitral Valve Disease,                 Arrythmias:Atrial Fibrillation, Signs/Symptoms:Shortness of                 Breath; Risk Factors:Dyslipidemia and  Hypertension.  Sonographer:    Bernadene Person RDCS Referring Phys: [phone] Kensley Valladares IMPRESSIONS  1. Left ventricular ejection fraction, by estimation, is 70 to 75%. The left ventricle has hyperdynamic function. Left ventricular endocardial border not optimally defined to evaluate regional wall motion. There is severe left ventricular hypertrophy. Left ventricular diastolic parameters are indeterminate.  2. Right ventricular systolic function is normal. The right ventricular size is normal. There is moderately elevated pulmonary artery systolic pressure.  3. Left atrial size was severely dilated.  4. A small pericardial effusion is present. The pericardial effusion is circumferential. There is no evidence of cardiac tamponade.  5. The mitral valve is abnormal. Mild mitral valve regurgitation. Severe mitral stenosis. Severe mitral annular calcification. The mean mitral valve gradient is 13.0 mmHg, HR 89 bpm  6. The tricuspid valve is abnormal. Tricuspid valve regurgitation is moderate.  7. The aortic valve is tricuspid. There is mild calcification of the aortic valve. There is mild thickening of the aortic valve. Aortic valve regurgitation is not visualized. No aortic stenosis is present.  8. Right atrial size was moderately dilated.  9. The inferior vena cava is normal in size with greater than 50% respiratory variability, suggesting right atrial pressure of 3 mmHg. 10. Moderate pulmonary HTN, PASP is 58 mmHg. FINDINGS  Left Ventricle: Left ventricular ejection fraction, by estimation, is 70 to 75%. The left ventricle has hyperdynamic function. Left ventricular endocardial border not optimally defined to evaluate regional wall motion. The left ventricular internal cavity size was normal in size. There is severe left ventricular hypertrophy. Left ventricular diastolic parameters are indeterminate. Right Ventricle: The right ventricular size is  normal. No increase in right ventricular wall thickness. Right ventricular  systolic function is normal. There is moderately elevated pulmonary artery systolic pressure. The tricuspid regurgitant velocity is 3.74 m/s, and with an assumed right atrial pressure of 3 mmHg, the estimated right ventricular systolic pressure is 123456 mmHg. Left Atrium: Left atrial size was severely dilated. Right Atrium: Right atrial size was moderately dilated. Pericardium: A small pericardial effusion is present. The pericardial effusion is circumferential. There is no evidence of cardiac tamponade. Mitral Valve: The mitral valve is abnormal. There is moderate thickening of the mitral valve leaflet(s). There is moderate calcification of the mitral valve leaflet(s). Severe mitral annular calcification. Mild mitral valve regurgitation. Severe mitral valve stenosis. MV peak gradient, 29.8 mmHg. The mean mitral valve gradient is 13.0 mmHg. Tricuspid Valve: The tricuspid valve is abnormal. Tricuspid valve regurgitation is moderate . No evidence of tricuspid stenosis. Aortic Valve: The aortic valve is tricuspid. There is mild calcification of the aortic valve. There is mild thickening of the aortic valve. There is mild aortic valve annular calcification. Aortic valve regurgitation is not visualized. No aortic stenosis  is present. Pulmonic Valve: The pulmonic valve was not well visualized. Pulmonic valve regurgitation is not visualized. No evidence of pulmonic stenosis. Aorta: The aortic root is normal in size and structure. Pulmonary Artery: Moderate pulmonary HTN, PASP is 58 mmHg. Venous: The inferior vena cava is normal in size with greater than 50% respiratory variability, suggesting right atrial pressure of 3 mmHg. IAS/Shunts: The interatrial septum was not well visualized.  LEFT VENTRICLE PLAX 2D LVIDd:         3.16 cm LVIDs:         2.22 cm LV PW:         1.50 cm LV IVS:        1.50 cm LVOT diam:     1.60 cm LV SV:         43 LV SV Index:   24 LVOT Area:     2.01 cm  RIGHT VENTRICLE RV S prime:     10.60 cm/s  TAPSE (M-mode): 1.1 cm LEFT ATRIUM              Index       RIGHT ATRIUM           Index LA diam:        5.30 cm  3.01 cm/m  RA Area:     22.10 cm LA Vol (A2C):   86.1 ml  48.96 ml/m RA Volume:   59.30 ml  33.72 ml/m LA Vol (A4C):   96.3 ml  54.76 ml/m LA Biplane Vol: 100.0 ml 56.86 ml/m  AORTIC VALVE LVOT Vmax:   98.80 cm/s LVOT Vmean:  77.700 cm/s LVOT VTI:    0.213 m  AORTA Ao Root diam: 2.50 cm Ao Asc diam:  2.70 cm MITRAL VALVE              TRICUSPID VALVE MV Area (PHT): 1.62 cm   TR Peak grad:   56.0 mmHg MV Area VTI:   0.52 cm   TR Vmax:        374.00 cm/s MV Peak grad:  29.8 mmHg MV Mean grad:  13.0 mmHg  SHUNTS MV Vmax:       2.73 m/s   Systemic VTI:  0.21 m MV Vmean:      171.0 cm/s Systemic Diam: 1.60 cm Carlyle Dolly MD Electronically signed by Carlyle Dolly MD Signature Date/Time: 11/22/2020/2:59:35 PM  Final    ECHOCARDIOGRAM COMPLETE  Result Date: 11/18/2020    ECHOCARDIOGRAM REPORT   Patient Name:   NALIYAH FARIS Date of Exam: 11/18/2020 Medical Rec #:  [id number]         Height:       63.0 in Accession #:    [id number]        Weight:       163.0 lb Date of Birth:  June 08, 1926         BSA:          1.773 m Patient Age:    35 years          BP:           161/64 mmHg Patient Gender: F                 HR:           74 bpm. Exam Location:  [facility] Procedure: 2D Echo, Cardiac Doppler and Color Doppler Indications:    R06.00 (ICD-10-CM) - DOE (dyspnea on exertion)                 R60.0 (ICD-10-CM) - Pedal edema  History:        Patient has prior history of Echocardiogram examinations, most                 recent 09/03/2019. CHF, Pacemaker, Stroke, Mitral Valve Disease,                 Arrythmias:Atrial Flutter; Risk Factors:Hypertension, Diabetes                 and Dyslipidemia. Chronic anticoagulation,Sick sinus syndrome.  Sonographer:    Alvino Chapel RCS Referring Phys: [phone] SCOTT A Radcliff  1. Left ventricular ejection fraction, by estimation, is 60 to 65%. The left  ventricle has normal function. The left ventricle has no regional wall motion abnormalities. There is moderate left ventricular hypertrophy. Left ventricular diastolic parameters are indeterminate.  2. Right ventricular systolic function is low normal. The right ventricular size is mildly enlarged. There is severely elevated pulmonary artery systolic pressure.  3. Left atrial size was severely dilated.  4. Right atrial size was severely dilated.  5. A small pericardial effusion is present. The pericardial effusion is circumferential.  6. The mitral valve is abnormal. Mild mitral valve regurgitation. Moderate mitral stenosis. Severe mitral annular calcification.  7. The tricuspid valve is abnormal. Tricuspid valve regurgitation is moderate.  8. The aortic valve is tricuspid. There is moderate calcification of the aortic valve. There is moderate thickening of the aortic valve. Aortic valve regurgitation is not visualized. Mild to moderate aortic valve stenosis.  9. The inferior vena cava is normal in size with greater than 50% respiratory variability, suggesting right atrial pressure of 3 mmHg. FINDINGS  Left Ventricle: Left ventricular ejection fraction, by estimation, is 60 to 65%. The left ventricle has normal function. The left ventricle has no regional wall motion abnormalities. The left ventricular internal cavity size was normal in size. There is  moderate left ventricular hypertrophy. Left ventricular diastolic parameters are indeterminate. Right Ventricle: The right ventricular size is mildly enlarged. Right vetricular wall thickness was not assessed. Right ventricular systolic function is low normal. There is severely elevated pulmonary artery systolic pressure. The tricuspid regurgitant velocity is 3.89 m/s, and with an assumed right atrial pressure of 8 mmHg, the estimated right ventricular systolic pressure is 0000000 mmHg. Left Atrium: Left atrial size was severely dilated.  Right Atrium: Right atrial size  was severely dilated. Pericardium: A small pericardial effusion is present. The pericardial effusion is circumferential. Mitral Valve: The mitral valve is abnormal. There is moderate thickening of the mitral valve leaflet(s). There is moderate calcification of the mitral valve leaflet(s). Severe mitral annular calcification. Mild mitral valve regurgitation. Moderate mitral  valve stenosis. MV peak gradient, 30.2 mmHg. The mean mitral valve gradient is 6.0 mmHg. Tricuspid Valve: The tricuspid valve is abnormal. Tricuspid valve regurgitation is moderate . No evidence of tricuspid stenosis. Aortic Valve: The aortic valve is tricuspid. There is moderate calcification of the aortic valve. There is moderate thickening of the aortic valve. There is moderate aortic valve annular calcification. Aortic valve regurgitation is not visualized. Mild to moderate aortic stenosis is present. Aortic valve mean gradient measures 12.0 mmHg. Aortic valve peak gradient measures 19.7 mmHg. Aortic valve area, by VTI measures 1.19 cm. Pulmonic Valve: The pulmonic valve was not well visualized. Pulmonic valve regurgitation is mild. No evidence of pulmonic stenosis. Aorta: The aortic root is normal in size and structure. Pulmonary Artery: Severe pulmonary HTN, PASP is 68 mmHg. Venous: The inferior vena cava is normal in size with greater than 50% respiratory variability, suggesting right atrial pressure of 3 mmHg. IAS/Shunts: No atrial level shunt detected by color flow Doppler. Additional Comments: A device lead is visualized.  LEFT VENTRICLE PLAX 2D LVIDd:         3.80 cm LVIDs:         1.90 cm LV PW:         1.40 cm LV IVS:        1.30 cm LVOT diam:     1.80 cm LV SV:         66 LV SV Index:   37 LVOT Area:     2.54 cm  RIGHT VENTRICLE RV S prime:     8.05 cm/s TAPSE (M-mode): 1.5 cm LEFT ATRIUM              Index       RIGHT ATRIUM           Index LA diam:        5.10 cm  2.88 cm/m  RA Area:     31.30 cm LA Vol (A2C):   114.0 ml 64.31  ml/m RA Volume:   94.60 ml  53.37 ml/m LA Vol (A4C):   96.5 ml  54.44 ml/m LA Biplane Vol: 109.0 ml 61.49 ml/m  AORTIC VALVE AV Area (Vmax):    1.23 cm AV Area (Vmean):   1.23 cm AV Area (VTI):     1.19 cm AV Vmax:           222.00 cm/s AV Vmean:          164.000 cm/s AV VTI:            0.554 m AV Peak Grad:      19.7 mmHg AV Mean Grad:      12.0 mmHg LVOT Vmax:         107.00 cm/s LVOT Vmean:        79.200 cm/s LVOT VTI:          0.258 m LVOT/AV VTI ratio: 0.47  AORTA Ao Root diam: 2.70 cm MITRAL VALVE                TRICUSPID VALVE MV Area (PHT): 2.05 cm     TR Peak grad:   60.5 mmHg MV Peak grad:  30.2 mmHg    TR Vmax:        389.00 cm/s MV Mean grad:  6.0 mmHg MV Vmax:       2.75 m/s     SHUNTS MV Vmean:      92.3 cm/s    Systemic VTI:  0.26 m MV Decel Time: 370 msec     Systemic Diam: 1.80 cm MV E velocity: 268.00 cm/s Carlyle Dolly MD Electronically signed by Carlyle Dolly MD Signature Date/Time: 11/18/2020/4:09:15 PM    Final     Orson Eva, DO  [facility]  If 7PM-7AM, please contact night-coverage [email or web address] Password [email or web address] 11/23/2020, 6:02 PM   LOS: 1 day

## 2020-11-23 NOTE — Progress Notes (Addendum)
Patients daughter called this Probation officer and wanted to come visit her Mom that her " Mom" the patient has a wound she needs to show me , and that her " Mom" has a visit tomorrow with the wound clinic. Advised patient about visitor policy that patient has had 2 visitors today and that is the allowance for today , Patients daughter got upset with this Probation officer and states " If something happens to my Mama's leg that's all I got to say ". Patients leg is wrapped with ace bandage at this time to RLE, Notified Dr. Carles Collet. Wound consult placed ,

## 2020-11-23 NOTE — Plan of Care (Signed)

## 2020-11-23 NOTE — Progress Notes (Signed)
Subjective:  Patient states her breathing has improved but not at baseline.  She remains with poor appetite.  She denies nausea vomiting or abdominal pain.  She says she did have a bowel movement this morning when she passed small amount of stool and large amount of flatus.  She is not sure if it was black.  She says blister over the right leg has decreased in size.  Current Medications:  Current Facility-Administered Medications:    (feeding supplement) PROSource Plus liquid 30 mL, 30 mL, Oral, Daily, Tat, David, MD, 30 mL at 11/23/20 1153   acetaminophen (TYLENOL) tablet 650 mg, 650 mg, Oral, Q6H PRN, Tat, David, MD   albuterol (VENTOLIN HFA) 108 (90 Base) MCG/ACT inhaler 1-2 puff, 1-2 puff, Inhalation, Q6H PRN, Adefeso, Oladapo, DO, 2 puff at 11/23/20 1000   diltiazem (CARDIZEM CD) 24 hr capsule 360 mg, 360 mg, Oral, Daily, Tat, David, MD, 360 mg at 11/23/20 0940   feeding supplement (BOOST / RESOURCE BREEZE) liquid 1 Container, 1 Container, Oral, TID BM, Orson Eva, MD, 1 Container at 11/23/20 1005   furosemide (LASIX) injection 60 mg, 60 mg, Intravenous, BID, Tat, David, MD, 60 mg at 11/23/20 0941   latanoprost (XALATAN) 0.005 % ophthalmic solution 1 drop, 1 drop, Both Eyes, QHS, Adefeso, Oladapo, DO, 1 drop at 11/22/20 2139   multivitamin with minerals tablet 1 tablet, 1 tablet, Oral, Daily, Tat, David, MD, 1 tablet at 11/23/20 0940   ondansetron (ZOFRAN) injection 4 mg, 4 mg, Intravenous, Q6H PRN, Adefeso, Oladapo, DO   pantoprazole (PROTONIX) injection 40 mg, 40 mg, Intravenous, Q12H, Rancour, Stephen, MD, 40 mg at 11/23/20 0940   polyethylene glycol (MIRALAX / GLYCOLAX) packet 17 g, 17 g, Oral, BID, Tat, David, MD, 17 g at 11/23/20 0940   pravastatin (PRAVACHOL) tablet 80 mg, 80 mg, Oral, Daily, Adefeso, Oladapo, DO, 80 mg at 11/23/20 0940   pravastatin (PRAVACHOL) tablet 80 mg, 80 mg, Oral, q1800, Tat, David, MD, 80 mg at 11/22/20 1710   Objective: Blood pressure (!) 163/60, pulse  77, temperature (!) 97.3 F (36.3 C), resp. rate 18, height 5' 3"  (1.6 m), weight 72.6 kg, SpO2 97 %. Patient is alert and is not tachypneic JVD remains elevated but less than yesterday. Cardiac exam with regular rhythm normal S1 and S2.  No murmur gallop noted. Auscultation lungs reveal rales at both bases. Abdomen is soft and nontender with organomegaly or masses. She has trace to 1+ pitting edema primarily involving her feet and ankles. She has Ace wrap covering both of her legs.  Labs/studies Results:   CBC Latest Ref Rng & Units 11/23/2020 11/22/2020 04/23/2020  WBC 4.0 - 10.5 K/uL 9.1 8.3 6.7  Hemoglobin 12.0 - 15.0 g/dL 11.3(L) 11.9(L) 10.5(L)  Hematocrit 36.0 - 46.0 % 36.5 38.8 35.2(L)  Platelets 150 - 400 K/uL 182 196 222    CMP Latest Ref Rng & Units 11/23/2020 11/22/2020 10/21/2020  Glucose 70 - 99 mg/dL 101(H) 98 104(H)  BUN 8 - 23 mg/dL 45(H) 40(H) 52(H)  Creatinine 0.44 - 1.00 mg/dL 1.92(H) 2.12(H) 2.85(H)  Sodium 135 - 145 mmol/L 135 134(L) 139  Potassium 3.5 - 5.1 mmol/L 4.1 4.1 5.7(H)  Chloride 98 - 111 mmol/L 94(L) 96(L) 98  CO2 22 - 32 mmol/L 30 26 20   Calcium 8.9 - 10.3 mg/dL 10.1 10.6(H) 10.3  Total Protein 6.5 - 8.1 g/dL 7.3 8.1 -  Total Bilirubin 0.3 - 1.2 mg/dL 1.1 0.7 -  Alkaline Phos 38 - 126 U/L 57 61 -  AST 15 - 41 U/L 23 24 -  ALT 0 - 44 U/L 15 17 -    Hepatic Function Latest Ref Rng & Units 11/23/2020 11/22/2020 04/23/2020  Total Protein 6.5 - 8.1 g/dL 7.3 8.1 7.0  Albumin 3.5 - 5.0 g/dL 4.2 4.7 4.0  AST 15 - 41 U/L 23 24 19   ALT 0 - 44 U/L 15 17 13   Alk Phosphatase 38 - 126 U/L 57 61 60  Total Bilirubin 0.3 - 1.2 mg/dL 1.1 0.7 0.8  Bilirubin, Direct 0.00 - 0.40 mg/dL - - -   INR 1.9.  Assessment:  #1.  Melena.  Stool was guaiac positive on admission.  It was black but she has been on iron.  Hemoglobin is only dropped by 0.6 g.  She is also being diuresed so this may not be true reflection of water hemoglobin is.  At any rate she does not appear  to be actively bleeding.  Given history of anorexia and weight loss EGD is warranted with she is not stable from medical standpoint. Warfarin is on hold.  #2.  Constipation/fecal impaction.  She was disimpacted while in emergency room.  She is now on polyethylene glycol and her bowels are moving without any difficulty.  #3.  CHF.  Patient is responding to diuretic therapy.                   troponin levels mildly elevated this admission.  #4.  Chronic kidney disease.  Renal function has improved since admission.  #5.  History of atrial fibrillation.  Anticoagulant on hold because of GI bleed.   Recommendations  Diet advanced to heart healthy. CBC with a.m. lab. Will leave her on pantoprazole 40 mg IV every 12 hours for now. Esophagogastroduodenoscopy when deemed to be stable from cardiac standpoint.

## 2020-11-24 ENCOUNTER — Encounter: Payer: Medicare HMO | Admitting: Physician Assistant

## 2020-11-24 DIAGNOSIS — I4891 Unspecified atrial fibrillation: Secondary | ICD-10-CM | POA: Diagnosis not present

## 2020-11-24 DIAGNOSIS — K922 Gastrointestinal hemorrhage, unspecified: Secondary | ICD-10-CM | POA: Diagnosis not present

## 2020-11-24 DIAGNOSIS — R110 Nausea: Secondary | ICD-10-CM | POA: Diagnosis not present

## 2020-11-24 DIAGNOSIS — N184 Chronic kidney disease, stage 4 (severe): Secondary | ICD-10-CM | POA: Diagnosis not present

## 2020-11-24 DIAGNOSIS — K5641 Fecal impaction: Secondary | ICD-10-CM | POA: Diagnosis not present

## 2020-11-24 DIAGNOSIS — I5033 Acute on chronic diastolic (congestive) heart failure: Secondary | ICD-10-CM | POA: Diagnosis not present

## 2020-11-24 LAB — BASIC METABOLIC PANEL
Anion gap: 10 (ref 5–15)
BUN: 43 mg/dL — ABNORMAL HIGH (ref 8–23)
CO2: 30 mmol/L (ref 22–32)
Calcium: 10.1 mg/dL (ref 8.9–10.3)
Chloride: 95 mmol/L — ABNORMAL LOW (ref 98–111)
Creatinine, Ser: 1.86 mg/dL — ABNORMAL HIGH (ref 0.44–1.00)
GFR, Estimated: 25 mL/min — ABNORMAL LOW (ref >60)
Glucose, Bld: 121 mg/dL — ABNORMAL HIGH (ref 70–99)
Potassium: 3.7 mmol/L (ref 3.5–5.1)
Sodium: 135 mmol/L (ref 135–145)

## 2020-11-24 LAB — CBC
HCT: 35.3 % — ABNORMAL LOW (ref 36.0–46.0)
Hemoglobin: 11.1 g/dL — ABNORMAL LOW (ref 12.0–15.0)
MCH: 28.5 pg (ref 26.0–34.0)
MCHC: 31.4 g/dL (ref 30.0–36.0)
MCV: 90.5 fL (ref 80.0–100.0)
Platelets: 165 10*3/uL (ref 150–400)
RBC: 3.9 MIL/uL (ref 3.87–5.11)
RDW: 13.4 % (ref 11.5–15.5)
WBC: 8.4 10*3/uL (ref 4.0–10.5)
nRBC: 0 % (ref 0.0–0.2)

## 2020-11-24 LAB — BRAIN NATRIURETIC PEPTIDE: B Natriuretic Peptide: 772 pg/mL — ABNORMAL HIGH (ref 0.0–100.0)

## 2020-11-24 LAB — MAGNESIUM: Magnesium: 2.2 mg/dL (ref 1.7–2.4)

## 2020-11-24 MED ORDER — TORSEMIDE 20 MG PO TABS
60.0000 mg | ORAL_TABLET | Freq: Every day | ORAL | Status: DC
  Administered 2020-11-25: 60 mg via ORAL
  Filled 2020-11-24: qty 3

## 2020-11-24 MED ORDER — COLLAGENASE 250 UNIT/GM EX OINT
TOPICAL_OINTMENT | Freq: Every day | CUTANEOUS | Status: DC
  Filled 2020-11-24: qty 30

## 2020-11-24 NOTE — Consult Note (Signed)
[facility] Nurse Consult Note: I was able to camera in via the portable cart and see the ulceration on the RLE.  It continues to have a yellow tinted biofilm over the wound bed. I explained to the patient and her primary nurse, Tori, that the PA that has been treating the wound is in agreement with the santyl and daily dressing regimen.  Neither the patient nor the LPN Tori had any questions. Monitor the wound area(s) for worsening of condition such as: Signs/symptoms of infection,  Increase in size,  Development of or worsening of odor, Development of pain, or increased pain at the affected locations.  Notify the medical team if any of these develop.  Thank you for the consult.  Discussed plan of care with the patient and bedside nurse.  [facility] nurse will not follow at this time.  Please re-consult the [facility] team if needed.  Val Riles, RN, MSN, CWOCN, CNS-BC, pager [phone]

## 2020-11-24 NOTE — Progress Notes (Signed)
Patient noted to be 97% on room air earlier this evening but c/o SOB and increased respiration. Called respiratory to come in with prn breathing treatment. Respiratory stated patient was satting in 70s at their time of arrival so after treatment patient was put back on 3L nasal cannula. Went back in to reassess patient and she is resting comfortably

## 2020-11-24 NOTE — Progress Notes (Signed)
PROGRESS NOTE  [name] [id number] DOB: May 04, 1927 DOA: 11/21/2020 PCP: Kathyrn Drown, MD   Brief History:  87 year old female with a history of a persistent atrial fibrillation on warfarin, hypertension, hyperlipidemia, stroke, sick sinus syndrome status post pacemaker, CKD stage IV, diastolic CHF, diabetes mellitus type 2, stroke presenting with constipation and nausea.  Apparently, patient had an episode of dry heaving earlier in the week.  The patient states that she has tried numerous over-the-counter remedies to help her constipation including mag citrate, MiraLAX, and enema without success.  In addition, the patient has also noted some melanotic stools.  She denies any NSAID use. Upon further questioning, the patient states that she has had some intermittent shortness of breath for the better part of the last 4 to 5 months.  She states that it has not really changed, but she does have dyspnea on exertion.  She endorses some orthopnea type symptoms.  She states that her lower extremity edema is about the same as usual.  She has been going to the wound care center for her venous stasis dermatitis and leg wound she denies any fevers, chills, chest pain, coughing, hemoptysis, hematochezia, dysuria, hematuria. In the emergency department, the patient had low-grade temperature of 99 1 F.  She was hemodynamically stable she was noted to have atrial fibrillation with RVR.  The patient was placed on 2 L nasal cannula.  Chest x-ray showed pulmonary vascular congestion with bilateral pleural effusions, R>left.  Abdominal x-ray showed moderate stool with the rectum distended with stool ball.   Assessment/Plan: Acute on chronic diastolic CHF -Patient remains clinically fluid overloaded -Continue IV furosemide 60 mg twice daily -plan to transition to po torsemide in am 6/21 -remains clinically fluid overloaded -Echo EF 70-75%; mod TR and mod pulm HTN -Accurate I's and O's -Daily  weights   FOBT positive/melena -Holding warfarin -GI consult appreciated-->possible EGD 6/20 or 6/21 -Hemoglobin remained stable -Continue PPI -discussed with Dr. Laural Golden   Fecal impaction -Patient states that she had a BM on the evening of 11/21/2020 -GI consult appreciated -Continue MiraLAX--increased to twice daily   Persistent atrial fibrillation -Continue Cardizem CD -Holding warfarin secondary to concerns of GI bleed   CKD stage IV -Baseline creatinine 2.1-2.4 -Monitor with diuresis   Tachybradycardia syndrome -Status post permanent pacemaker 2010 -Patient follows with Dr. Caryl Comes   Essential hypertension -Continue diltiazem CD   Hyperlipidemia -Continue statin   Venous stasis dermatitis -appreciate WOCN consult and recommendations -R-leg changed 6/20             Status is: Inpatient   The patient will require care spanning > 2 midnights and should be moved to inpatient because: Ongoing diagnostic testing needed not appropriate for outpatient work up   Dispo: The patient is from: Home              Anticipated d/c is to: Home              Patient currently is not medically stable to d/c.              Difficult to place patient No               Family Communication:   daughter updated 6/20   Consultants:  GI   Code Status:  FULL   DVT Prophylaxis:  warfarin     Procedures: As Listed in Progress Note Above   Antibiotics: None    Subjective:  Patient denies fevers, chills, headache, chest pain, dyspnea, nausea, vomiting, diarrhea, abdominal pain, dysuria, hematuria, hematochezia, and melena.   Objective: Vitals:   11/23/20 2118 11/24/20 0300 11/24/20 0506 11/24/20 1420  BP: (!) 158/58 (!) 183/66 (!) 149/52 (!) 162/85  Pulse: 84 89 78 90  Resp: (!) 22 (!) '24 18 16  '$ Temp: 98.8 F (37.1 C)  97.9 F (36.6 C) 98.6 F (37 C)  TempSrc:    Oral  SpO2: 100% 98% 97% 99%  Weight:      Height:        Intake/Output Summary (Last 24 hours)  at 11/24/2020 1707 Last data filed at 11/24/2020 1200 Gross per 24 hour  Intake 480 ml  Output --  Net 480 ml   Weight change:  Exam:  General:  Pt is alert, follows commands appropriately, not in acute distress HEENT: No icterus, No thrush, No neck mass, [place]/AT Cardiovascular: RRR, S1/S2, no rubs, no gallops Respiratory: bibasilar rales. No wheeze Abdomen: Soft/+BS, non tender, non distended, no guarding Extremities: trace LE edema, No lymphangitis, No petechiae, No rashes, no synovitis   Data Reviewed: I have personally reviewed following labs and imaging studies Basic Metabolic Panel: Recent Labs  Lab 11/22/20 0000 11/22/20 0316 11/23/20 0634 11/24/20 0621  NA 134*  --  135 135  K 4.1  --  4.1 3.7  CL 96*  --  94* 95*  CO2 26  --  30 30  GLUCOSE 98  --  101* 121*  BUN 40*  --  45* 43*  CREATININE 2.12*  --  1.92* 1.86*  CALCIUM 10.6*  --  10.1 10.1  MG  --  2.2 2.2 2.2  PHOS  --  2.7  --   --    Liver Function Tests: Recent Labs  Lab 11/22/20 0000 11/23/20 0634  AST 24 23  ALT 17 15  ALKPHOS 61 57  BILITOT 0.7 1.1  PROT 8.1 7.3  ALBUMIN 4.7 4.2   Recent Labs  Lab 11/22/20 0000  LIPASE 57*   No results for input(s): AMMONIA in the last 168 hours. Coagulation Profile: Recent Labs  Lab 11/22/20 0001 11/23/20 0634  INR 1.6* 1.9*   CBC: Recent Labs  Lab 11/22/20 0000 11/23/20 0634 11/24/20 0621  WBC 8.3 9.1 8.4  NEUTROABS 6.0  --   --   HGB 11.9* 11.3* 11.1*  HCT 38.8 36.5 35.3*  MCV 90.4 90.8 90.5  PLT 196 182 165   Cardiac Enzymes: No results for input(s): CKTOTAL, CKMB, CKMBINDEX, TROPONINI in the last 168 hours. BNP: Invalid input(s): POCBNP CBG: No results for input(s): GLUCAP in the last 168 hours. HbA1C: No results for input(s): HGBA1C in the last 72 hours. Urine analysis:    Component Value Date/Time   COLORURINE STRAW (A) 11/22/2020 0050   APPEARANCEUR CLEAR 11/22/2020 0050   LABSPEC 1.005 11/22/2020 0050   PHURINE 7.0  11/22/2020 0050   GLUCOSEU NEGATIVE 11/22/2020 0050   HGBUR MODERATE (A) 11/22/2020 0050   BILIRUBINUR NEGATIVE 11/22/2020 0050   KETONESUR NEGATIVE 11/22/2020 0050   PROTEINUR NEGATIVE 11/22/2020 0050   UROBILINOGEN 1.0 07/11/2014 2325   NITRITE NEGATIVE 11/22/2020 0050   LEUKOCYTESUR NEGATIVE 11/22/2020 0050   Sepsis Labs: '@LABRCNTIP'$ (procalcitonin:4,lacticidven:4) ) Recent Results (from the past 240 hour(s))  Resp Panel by RT-PCR (Flu A&B, Covid) Nasopharyngeal Swab     Status: None   Collection Time: 11/22/20  2:25 AM   Specimen: Nasopharyngeal Swab; Nasopharyngeal(NP) swabs in vial transport medium  Result Value Ref Range  Status   SARS Coronavirus 2 by RT PCR NEGATIVE NEGATIVE Final    Comment: (NOTE) SARS-CoV-2 target nucleic acids are NOT DETECTED.  The SARS-CoV-2 RNA is generally detectable in upper respiratory specimens during the acute phase of infection. The lowest concentration of SARS-CoV-2 viral copies this assay can detect is 138 copies/mL. A negative result does not preclude SARS-Cov-2 infection and should not be used as the sole basis for treatment or other patient management decisions. A negative result may occur with  improper specimen collection/handling, submission of specimen other than nasopharyngeal swab, presence of viral mutation(s) within the areas targeted by this assay, and inadequate number of viral copies(<138 copies/mL). A negative result must be combined with clinical observations, patient history, and epidemiological information. The expected result is Negative.  Fact Sheet for Patients:  EntrepreneurPulse.com.au  Fact Sheet for Healthcare Providers:  IncredibleEmployment.be  This test is no t yet approved or cleared by the [place] FDA and  has been authorized for detection and/or diagnosis of SARS-CoV-2 by FDA under an Emergency Use Authorization (EUA). This EUA will remain  in effect (meaning this  test can be used) for the duration of the COVID-19 declaration under Section 564(b)(1) of the Act, 21 U.S.C.section 360bbb-3(b)(1), unless the authorization is terminated  or revoked sooner.       Influenza A by PCR NEGATIVE NEGATIVE Final   Influenza B by PCR NEGATIVE NEGATIVE Final    Comment: (NOTE) The Xpert Xpress SARS-CoV-2/FLU/RSV plus assay is intended as an aid in the diagnosis of influenza from Nasopharyngeal swab specimens and should not be used as a sole basis for treatment. Nasal washings and aspirates are unacceptable for Xpert Xpress SARS-CoV-2/FLU/RSV testing.  Fact Sheet for Patients: EntrepreneurPulse.com.au  Fact Sheet for Healthcare Providers: IncredibleEmployment.be  This test is not yet approved or cleared by the [place] FDA and has been authorized for detection and/or diagnosis of SARS-CoV-2 by FDA under an Emergency Use Authorization (EUA). This EUA will remain in effect (meaning this test can be used) for the duration of the COVID-19 declaration under Section 564(b)(1) of the Act, 21 U.S.C. section 360bbb-3(b)(1), unless the authorization is terminated or revoked.  Performed at [facility], [address], [place], [place] [address]      Scheduled Meds:  (feeding supplement) PROSource Plus  30 mL Oral Daily   collagenase   Topical Daily   diltiazem  360 mg Oral Daily   feeding supplement  1 Container Oral TID BM   furosemide  60 mg Intravenous BID   latanoprost  1 drop Both Eyes QHS   multivitamin with minerals  1 tablet Oral Daily   pantoprazole (PROTONIX) IV  40 mg Intravenous Q12H   polyethylene glycol  17 g Oral BID   pravastatin  80 mg Oral Daily   Continuous Infusions:  Procedures/Studies: DG Abdomen Acute W/Chest  Result Date: 11/21/2020 CLINICAL DATA:  Constipation. EXAM: DG ABDOMEN ACUTE WITH 1 VIEW CHEST COMPARISON:  Chest radiograph 06/05/2020, abdominal CT 10/10/2018 FINDINGS: Dual lead  left-sided pacemaker in place. Stable cardiomegaly. Aortic atherosclerosis and tortuosity. Bilateral pleural effusions with improvement on the left but worsening on the right from prior exam. Associated bibasilar volume loss/atelectasis. Pulmonary vascular congestion, also seen on prior. Minimal fluid tracks into the right minor fissure. Bilateral shoulder arthropathy. No bowel dilatation to suggest obstruction. No free intra-abdominal air. Moderate stool in the ascending, transverse, proximal descending colon. Stool balls distending the rectum, mild rectal distention of 6.5 cm. Popcorn calcifications in the pelvis  correspond to calcified fibroids. Calcifications to the right of L2-L3 correspond to right renal parenchymal calcification. Prominent vascular calcifications. No acute osseous abnormalities are seen. IMPRESSION: 1. Bilateral pleural effusions with improvement on the left and worsening on the right from December 2021 radiograph. 2. Cardiomegaly with vascular congestion. 3. Moderate stool burden, with stool ball distending the rectum, suggesting constipation and possible fecal impaction. Mild rectal distention of 6.5 cm. No evidence of bowel obstruction or free air. Electronically Signed   By: Keith Rake M.D.   On: 11/21/2020 23:19   ECHOCARDIOGRAM COMPLETE  Result Date: 11/22/2020    ECHOCARDIOGRAM REPORT   Patient Name:   [name] Date of Exam: 11/22/2020 Medical Rec #:  [id number]         Height:       63.0 in Accession #:    [id number]        Weight:       160.0 lb Date of Birth:  [birth year]         BSA:          1.759 m Patient Age:    69 years          BP:           188/71 mmHg Patient Gender: F                 HR:           86 bpm. Exam Location:  [facility] Procedure: 2D Echo, Color Doppler and Cardiac Doppler Indications:    CHF-Acute Diastolic [id number]  History:        Patient has prior history of Echocardiogram examinations, most                 recent 11/18/2020. Stroke, Mitral  Valve Disease,                 Arrythmias:Atrial Fibrillation, Signs/Symptoms:Shortness of                 Breath; Risk Factors:Dyslipidemia and Hypertension.  Sonographer:    Bernadene Person RDCS Referring Phys: [phone] Kardell Virgil IMPRESSIONS  1. Left ventricular ejection fraction, by estimation, is 70 to 75%. The left ventricle has hyperdynamic function. Left ventricular endocardial border not optimally defined to evaluate regional wall motion. There is severe left ventricular hypertrophy. Left ventricular diastolic parameters are indeterminate.  2. Right ventricular systolic function is normal. The right ventricular size is normal. There is moderately elevated pulmonary artery systolic pressure.  3. Left atrial size was severely dilated.  4. A small pericardial effusion is present. The pericardial effusion is circumferential. There is no evidence of cardiac tamponade.  5. The mitral valve is abnormal. Mild mitral valve regurgitation. Severe mitral stenosis. Severe mitral annular calcification. The mean mitral valve gradient is 13.0 mmHg, HR 89 bpm  6. The tricuspid valve is abnormal. Tricuspid valve regurgitation is moderate.  7. The aortic valve is tricuspid. There is mild calcification of the aortic valve. There is mild thickening of the aortic valve. Aortic valve regurgitation is not visualized. No aortic stenosis is present.  8. Right atrial size was moderately dilated.  9. The inferior vena cava is normal in size with greater than 50% respiratory variability, suggesting right atrial pressure of 3 mmHg. 10. Moderate pulmonary HTN, PASP is 58 mmHg. FINDINGS  Left Ventricle: Left ventricular ejection fraction, by estimation, is 70 to 75%. The left ventricle has hyperdynamic function. Left ventricular endocardial border not optimally defined to evaluate regional  wall motion. The left ventricular internal cavity size was normal in size. There is severe left ventricular hypertrophy. Left ventricular diastolic  parameters are indeterminate. Right Ventricle: The right ventricular size is normal. No increase in right ventricular wall thickness. Right ventricular systolic function is normal. There is moderately elevated pulmonary artery systolic pressure. The tricuspid regurgitant velocity is 3.74 m/s, and with an assumed right atrial pressure of 3 mmHg, the estimated right ventricular systolic pressure is 123456 mmHg. Left Atrium: Left atrial size was severely dilated. Right Atrium: Right atrial size was moderately dilated. Pericardium: A small pericardial effusion is present. The pericardial effusion is circumferential. There is no evidence of cardiac tamponade. Mitral Valve: The mitral valve is abnormal. There is moderate thickening of the mitral valve leaflet(s). There is moderate calcification of the mitral valve leaflet(s). Severe mitral annular calcification. Mild mitral valve regurgitation. Severe mitral valve stenosis. MV peak gradient, 29.8 mmHg. The mean mitral valve gradient is 13.0 mmHg. Tricuspid Valve: The tricuspid valve is abnormal. Tricuspid valve regurgitation is moderate . No evidence of tricuspid stenosis. Aortic Valve: The aortic valve is tricuspid. There is mild calcification of the aortic valve. There is mild thickening of the aortic valve. There is mild aortic valve annular calcification. Aortic valve regurgitation is not visualized. No aortic stenosis  is present. Pulmonic Valve: The pulmonic valve was not well visualized. Pulmonic valve regurgitation is not visualized. No evidence of pulmonic stenosis. Aorta: The aortic root is normal in size and structure. Pulmonary Artery: Moderate pulmonary HTN, PASP is 58 mmHg. Venous: The inferior vena cava is normal in size with greater than 50% respiratory variability, suggesting right atrial pressure of 3 mmHg. IAS/Shunts: The interatrial septum was not well visualized.  LEFT VENTRICLE PLAX 2D LVIDd:         3.16 cm LVIDs:         2.22 cm LV PW:         1.50  cm LV IVS:        1.50 cm LVOT diam:     1.60 cm LV SV:         43 LV SV Index:   24 LVOT Area:     2.01 cm  RIGHT VENTRICLE RV S prime:     10.60 cm/s TAPSE (M-mode): 1.1 cm LEFT ATRIUM              Index       RIGHT ATRIUM           Index LA diam:        5.30 cm  3.01 cm/m  RA Area:     22.10 cm LA Vol (A2C):   86.1 ml  48.96 ml/m RA Volume:   59.30 ml  33.72 ml/m LA Vol (A4C):   96.3 ml  54.76 ml/m LA Biplane Vol: 100.0 ml 56.86 ml/m  AORTIC VALVE LVOT Vmax:   98.80 cm/s LVOT Vmean:  77.700 cm/s LVOT VTI:    0.213 m  AORTA Ao Root diam: 2.50 cm Ao Asc diam:  2.70 cm MITRAL VALVE              TRICUSPID VALVE MV Area (PHT): 1.62 cm   TR Peak grad:   56.0 mmHg MV Area VTI:   0.52 cm   TR Vmax:        374.00 cm/s MV Peak grad:  29.8 mmHg MV Mean grad:  13.0 mmHg  SHUNTS MV Vmax:       2.73 m/s   Systemic  VTI:  0.21 m MV Vmean:      171.0 cm/s Systemic Diam: 1.60 cm Carlyle Dolly MD Electronically signed by Carlyle Dolly MD Signature Date/Time: 11/22/2020/2:59:35 PM    Final    ECHOCARDIOGRAM COMPLETE  Result Date: 11/18/2020    ECHOCARDIOGRAM REPORT   Patient Name:   [name] Date of Exam: 11/18/2020 Medical Rec #:  [id number]         Height:       63.0 in Accession #:    [id number]        Weight:       163.0 lb Date of Birth:  02-08-1927         BSA:          1.773 m Patient Age:    26 years          BP:           161/64 mmHg Patient Gender: F                 HR:           74 bpm. Exam Location:  [facility] Procedure: 2D Echo, Cardiac Doppler and Color Doppler Indications:    R06.00 (ICD-10-CM) - DOE (dyspnea on exertion)                 R60.0 (ICD-10-CM) - Pedal edema  History:        Patient has prior history of Echocardiogram examinations, most                 recent 09/03/2019. CHF, Pacemaker, Stroke, Mitral Valve Disease,                 Arrythmias:Atrial Flutter; Risk Factors:Hypertension, Diabetes                 and Dyslipidemia. Chronic anticoagulation,Sick sinus syndrome.   Sonographer:    Alvino Chapel RCS Referring Phys: [phone] SCOTT A [place]  1. Left ventricular ejection fraction, by estimation, is 60 to 65%. The left ventricle has normal function. The left ventricle has no regional wall motion abnormalities. There is moderate left ventricular hypertrophy. Left ventricular diastolic parameters are indeterminate.  2. Right ventricular systolic function is low normal. The right ventricular size is mildly enlarged. There is severely elevated pulmonary artery systolic pressure.  3. Left atrial size was severely dilated.  4. Right atrial size was severely dilated.  5. A small pericardial effusion is present. The pericardial effusion is circumferential.  6. The mitral valve is abnormal. Mild mitral valve regurgitation. Moderate mitral stenosis. Severe mitral annular calcification.  7. The tricuspid valve is abnormal. Tricuspid valve regurgitation is moderate.  8. The aortic valve is tricuspid. There is moderate calcification of the aortic valve. There is moderate thickening of the aortic valve. Aortic valve regurgitation is not visualized. Mild to moderate aortic valve stenosis.  9. The inferior vena cava is normal in size with greater than 50% respiratory variability, suggesting right atrial pressure of 3 mmHg. FINDINGS  Left Ventricle: Left ventricular ejection fraction, by estimation, is 60 to 65%. The left ventricle has normal function. The left ventricle has no regional wall motion abnormalities. The left ventricular internal cavity size was normal in size. There is  moderate left ventricular hypertrophy. Left ventricular diastolic parameters are indeterminate. Right Ventricle: The right ventricular size is mildly enlarged. Right vetricular wall thickness was not assessed. Right ventricular systolic function is low normal. There is severely elevated pulmonary artery systolic pressure. The  tricuspid regurgitant velocity is 3.89 m/s, and with an assumed right atrial pressure  of 8 mmHg, the estimated right ventricular systolic pressure is 0000000 mmHg. Left Atrium: Left atrial size was severely dilated. Right Atrium: Right atrial size was severely dilated. Pericardium: A small pericardial effusion is present. The pericardial effusion is circumferential. Mitral Valve: The mitral valve is abnormal. There is moderate thickening of the mitral valve leaflet(s). There is moderate calcification of the mitral valve leaflet(s). Severe mitral annular calcification. Mild mitral valve regurgitation. Moderate mitral  valve stenosis. MV peak gradient, 30.2 mmHg. The mean mitral valve gradient is 6.0 mmHg. Tricuspid Valve: The tricuspid valve is abnormal. Tricuspid valve regurgitation is moderate . No evidence of tricuspid stenosis. Aortic Valve: The aortic valve is tricuspid. There is moderate calcification of the aortic valve. There is moderate thickening of the aortic valve. There is moderate aortic valve annular calcification. Aortic valve regurgitation is not visualized. Mild to moderate aortic stenosis is present. Aortic valve mean gradient measures 12.0 mmHg. Aortic valve peak gradient measures 19.7 mmHg. Aortic valve area, by VTI measures 1.19 cm. Pulmonic Valve: The pulmonic valve was not well visualized. Pulmonic valve regurgitation is mild. No evidence of pulmonic stenosis. Aorta: The aortic root is normal in size and structure. Pulmonary Artery: Severe pulmonary HTN, PASP is 68 mmHg. Venous: The inferior vena cava is normal in size with greater than 50% respiratory variability, suggesting right atrial pressure of 3 mmHg. IAS/Shunts: No atrial level shunt detected by color flow Doppler. Additional Comments: A device lead is visualized.  LEFT VENTRICLE PLAX 2D LVIDd:         3.80 cm LVIDs:         1.90 cm LV PW:         1.40 cm LV IVS:        1.30 cm LVOT diam:     1.80 cm LV SV:         66 LV SV Index:   37 LVOT Area:     2.54 cm  RIGHT VENTRICLE RV S prime:     8.05 cm/s TAPSE (M-mode): 1.5  cm LEFT ATRIUM              Index       RIGHT ATRIUM           Index LA diam:        5.10 cm  2.88 cm/m  RA Area:     31.30 cm LA Vol (A2C):   114.0 ml 64.31 ml/m RA Volume:   94.60 ml  53.37 ml/m LA Vol (A4C):   96.5 ml  54.44 ml/m LA Biplane Vol: 109.0 ml 61.49 ml/m  AORTIC VALVE AV Area (Vmax):    1.23 cm AV Area (Vmean):   1.23 cm AV Area (VTI):     1.19 cm AV Vmax:           222.00 cm/s AV Vmean:          164.000 cm/s AV VTI:            0.554 m AV Peak Grad:      19.7 mmHg AV Mean Grad:      12.0 mmHg LVOT Vmax:         107.00 cm/s LVOT Vmean:        79.200 cm/s LVOT VTI:          0.258 m LVOT/AV VTI ratio: 0.47  AORTA Ao Root diam: 2.70 cm MITRAL VALVE  TRICUSPID VALVE MV Area (PHT): 2.05 cm     TR Peak grad:   60.5 mmHg MV Peak grad:  30.2 mmHg    TR Vmax:        389.00 cm/s MV Mean grad:  6.0 mmHg MV Vmax:       2.75 m/s     SHUNTS MV Vmean:      92.3 cm/s    Systemic VTI:  0.26 m MV Decel Time: 370 msec     Systemic Diam: 1.80 cm MV E velocity: 268.00 cm/s Carlyle Dolly MD Electronically signed by Carlyle Dolly MD Signature Date/Time: 11/18/2020/4:09:15 PM    Final     Orson Eva, DO  [facility]  If 7PM-7AM, please contact night-coverage [email or web address] Password [email or web address] 11/24/2020, 5:07 PM   LOS: 2 days

## 2020-11-24 NOTE — Progress Notes (Signed)
Subjective: Patient is pleasant 87 year old female, she states constipation has improved drastically since disimpaction in ED and miralax administration. Reports 2-3 loose BMs per day now. Still endorses black stools, no BRBPR. She denies nausea, vomiting, abdominal pain or reflux symptoms, appetite still decreased. She does endorse passing a large amount of flatus since moving her bowels. Reports her breathing has improved, still remains on supplemental O2.   Objective: Vital signs in last 24 hours: Temp:  [97.9 F (36.6 C)-98.8 F (37.1 C)] 97.9 F (36.6 C) (06/20 0506) Pulse Rate:  [78-89] 78 (06/20 0506) Resp:  [18-24] 18 (06/20 0506) BP: (149-183)/(52-68) 149/52 (06/20 0506) SpO2:  [97 %-100 %] 97 % (06/20 0506) Last BM Date: 11/23/20 (Simultaneous filing. User may not have seen previous data.) General:   Alert and oriented, pleasant Heart:  S1, S2 present, no murmurs noted.  Lungs: Rales in bilateral lung bases. Abdomen:  Bowel sounds present, soft, non-tender, non-distended. No HSM or hernias noted. No rebound or guarding. No masses appreciated  Msk:  Symmetrical without gross deformities. Normal posture. Extremities:  mild pitting edema in feet and ankles.  Neurologic:  Alert and  oriented x4;  grossly normal neurologically. Skin:  Warm and dry, intact without significant lesions.  Psych:  Alert and cooperative. Normal mood and affect.  Intake/Output from previous day: 06/19 0701 - 06/20 0700 In: 63 [P.O.:420] Out: 400 [Urine:400] Intake/Output this shift: Total I/O In: 240 [P.O.:240] Out: -   Lab Results: Recent Labs    11/22/20 0000 11/23/20 0634 11/24/20 0621  WBC 8.3 9.1 8.4  HGB 11.9* 11.3* 11.1*  HCT 38.8 36.5 35.3*  PLT 196 182 165   BMET Recent Labs    11/22/20 0000 11/23/20 0634 11/24/20 0621  NA 134* 135 135  K 4.1 4.1 3.7  CL 96* 94* 95*  CO2 '26 30 30  '$ GLUCOSE 98 101* 121*  BUN 40* 45* 43*  CREATININE 2.12* 1.92* 1.86*  CALCIUM 10.6* 10.1  10.1   LFT Recent Labs    11/22/20 0000 11/23/20 0634  PROT 8.1 7.3  ALBUMIN 4.7 4.2  AST 24 23  ALT 17 15  ALKPHOS 61 57  BILITOT 0.7 1.1   PT/INR Recent Labs    11/22/20 0001 11/23/20 0634  LABPROT 18.7* 21.4*  INR 1.6* 1.9*   Studies/Results: ECHOCARDIOGRAM COMPLETE  Result Date: 11/22/2020    ECHOCARDIOGRAM REPORT   Patient Name:   [name] Date of Exam: 11/22/2020 Medical Rec #:  [id number]         Height:       63.0 in Accession #:    [id number]        Weight:       160.0 lb Date of Birth:  01/26/1927         BSA:          1.759 m Patient Age:    50 years          BP:           188/71 mmHg Patient Gender: F                 HR:           86 bpm. Exam Location:  [facility] Procedure: 2D Echo, Color Doppler and Cardiac Doppler Indications:    CHF-Acute Diastolic [id number]  History:        Patient has prior history of Echocardiogram examinations, most  recent 11/18/2020. Stroke, Mitral Valve Disease,                 Arrythmias:Atrial Fibrillation, Signs/Symptoms:Shortness of                 Breath; Risk Factors:Dyslipidemia and Hypertension.  Sonographer:    Bernadene Person RDCS Referring Phys: [phone] DAVID TAT IMPRESSIONS  1. Left ventricular ejection fraction, by estimation, is 70 to 75%. The left ventricle has hyperdynamic function. Left ventricular endocardial border not optimally defined to evaluate regional wall motion. There is severe left ventricular hypertrophy. Left ventricular diastolic parameters are indeterminate.  2. Right ventricular systolic function is normal. The right ventricular size is normal. There is moderately elevated pulmonary artery systolic pressure.  3. Left atrial size was severely dilated.  4. A small pericardial effusion is present. The pericardial effusion is circumferential. There is no evidence of cardiac tamponade.  5. The mitral valve is abnormal. Mild mitral valve regurgitation. Severe mitral stenosis. Severe mitral annular  calcification. The mean mitral valve gradient is 13.0 mmHg, HR 89 bpm  6. The tricuspid valve is abnormal. Tricuspid valve regurgitation is moderate.  7. The aortic valve is tricuspid. There is mild calcification of the aortic valve. There is mild thickening of the aortic valve. Aortic valve regurgitation is not visualized. No aortic stenosis is present.  8. Right atrial size was moderately dilated.  9. The inferior vena cava is normal in size with greater than 50% respiratory variability, suggesting right atrial pressure of 3 mmHg. 10. Moderate pulmonary HTN, PASP is 58 mmHg. FINDINGS  Left Ventricle: Left ventricular ejection fraction, by estimation, is 70 to 75%. The left ventricle has hyperdynamic function. Left ventricular endocardial border not optimally defined to evaluate regional wall motion. The left ventricular internal cavity size was normal in size. There is severe left ventricular hypertrophy. Left ventricular diastolic parameters are indeterminate. Right Ventricle: The right ventricular size is normal. No increase in right ventricular wall thickness. Right ventricular systolic function is normal. There is moderately elevated pulmonary artery systolic pressure. The tricuspid regurgitant velocity is 3.74 m/s, and with an assumed right atrial pressure of 3 mmHg, the estimated right ventricular systolic pressure is 123456 mmHg. Left Atrium: Left atrial size was severely dilated. Right Atrium: Right atrial size was moderately dilated. Pericardium: A small pericardial effusion is present. The pericardial effusion is circumferential. There is no evidence of cardiac tamponade. Mitral Valve: The mitral valve is abnormal. There is moderate thickening of the mitral valve leaflet(s). There is moderate calcification of the mitral valve leaflet(s). Severe mitral annular calcification. Mild mitral valve regurgitation. Severe mitral valve stenosis. MV peak gradient, 29.8 mmHg. The mean mitral valve gradient is 13.0  mmHg. Tricuspid Valve: The tricuspid valve is abnormal. Tricuspid valve regurgitation is moderate . No evidence of tricuspid stenosis. Aortic Valve: The aortic valve is tricuspid. There is mild calcification of the aortic valve. There is mild thickening of the aortic valve. There is mild aortic valve annular calcification. Aortic valve regurgitation is not visualized. No aortic stenosis  is present. Pulmonic Valve: The pulmonic valve was not well visualized. Pulmonic valve regurgitation is not visualized. No evidence of pulmonic stenosis. Aorta: The aortic root is normal in size and structure. Pulmonary Artery: Moderate pulmonary HTN, PASP is 58 mmHg. Venous: The inferior vena cava is normal in size with greater than 50% respiratory variability, suggesting right atrial pressure of 3 mmHg. IAS/Shunts: The interatrial septum was not well visualized.  LEFT VENTRICLE PLAX 2D LVIDd:  3.16 cm LVIDs:         2.22 cm LV PW:         1.50 cm LV IVS:        1.50 cm LVOT diam:     1.60 cm LV SV:         43 LV SV Index:   24 LVOT Area:     2.01 cm  RIGHT VENTRICLE RV S prime:     10.60 cm/s TAPSE (M-mode): 1.1 cm LEFT ATRIUM              Index       RIGHT ATRIUM           Index LA diam:        5.30 cm  3.01 cm/m  RA Area:     22.10 cm LA Vol (A2C):   86.1 ml  48.96 ml/m RA Volume:   59.30 ml  33.72 ml/m LA Vol (A4C):   96.3 ml  54.76 ml/m LA Biplane Vol: 100.0 ml 56.86 ml/m  AORTIC VALVE LVOT Vmax:   98.80 cm/s LVOT Vmean:  77.700 cm/s LVOT VTI:    0.213 m  AORTA Ao Root diam: 2.50 cm Ao Asc diam:  2.70 cm MITRAL VALVE              TRICUSPID VALVE MV Area (PHT): 1.62 cm   TR Peak grad:   56.0 mmHg MV Area VTI:   0.52 cm   TR Vmax:        374.00 cm/s MV Peak grad:  29.8 mmHg MV Mean grad:  13.0 mmHg  SHUNTS MV Vmax:       2.73 m/s   Systemic VTI:  0.21 m MV Vmean:      171.0 cm/s Systemic Diam: 1.60 cm Carlyle Dolly MD Electronically signed by Carlyle Dolly MD Signature Date/Time: 11/22/2020/2:59:35 PM    Final      Assessment/Plan: [name] is a 87 year old female with history of A. Fib, on warfarin, HTN, HLD, stroke, sick sinus syndrome, CKD stage IV, diastolic CHF, DM type 2, who presented to ED on 6/17 with constipation and nausea and had reported 20 pound weight loss over the past 4-6 months due to decreased appetite. Patient reportedly tried multiple OTC interventions for constipation, without relief. She also endorsed melanotic stools upon arrival. She denied any NSAID use. GI consulted for concerns of melena/heme positive stool for possible EGD.   Constipation/Fecal impaction: Moderate stool burden w/stool ball distending the rectum, present on abdominal xray on 6/17. No evidence of bowel obstruction of free air. Disimpacted while in ED. Currently on miralax, having 2-3 BMs/day without difficulty.   -Continue Miralax BID with good result  Melena: stools heme positive on admission. Stools were reportedly black, however, patient had been taking PO Iron which could be contributory. Hgb continues to remain stable, initially 11.9, trending down slightly, however no significant drop, 11.1 this morning. Patient being diuresed, initially suspicious of possible hemoconcentration, however, no active bleeding at this time. Does endorse that stools continue to be black. Recommend EGD given history of anorexia and weight loss.   -Continue protonix '40mg'$  IV q 12 hours.  -EGD to be performed when patient deemed stable from cardiac standpoint -Continue to trend H&H daily  CHF/Fluid overload: patient continues to be diuresed with lasix '60mg'$  BID. Reports improvement in breathing and appears in no acute respiratory distress. -Will reassess cardiac stability tomorrow in preparation of scheduling EGD.   History of A Fib.: Coumadin  currently being held due to concern for GI bleed. Unsure of last coumadin dose, however, none since hospital admission on 6/18.     LOS: 2 days    11/24/2020, 10:12 AM  Graiden Henes L.  Alver Sorrow, MSN, APRN, AGNP-C [facility]

## 2020-11-24 NOTE — Progress Notes (Signed)
CCMD called and stated that patient was in a.fib ( baseline rhythm) with increasing PVCs and larger t waves.  Patient is in mild respiratory distress, respirations approximately 26 times a minute.  Patient vitals done and ekg done.  Notified respiratory therapy of prn breathing treatment and ekg seen by MD.  No new orders received at this time.  Told to monitor patient.  Went back to reassess patient and patient was resting comfortably.

## 2020-11-24 NOTE — Consult Note (Addendum)
WOC Nurse Consult Note: Patient receiving care in AP 308. Reason for Consult: venous stasis ulcers Wound type: as above Pressure Injury POA: Yes/No/NA Measurement: To be provided by the bedside RN in the flowsheet section  I had a Secure Chat conversation with Jeri Cos III PA-C treating the RLE wounds.  He tells me they have been using hydrofera blue to combat the recurrent layer of biofilm, and since [facility] does not have this product on formulary, he is okay with using the following approach: Apply santyl to wounds on RLE AFTER washing with saline.  Then top with a lightly moistened saline gauze dressing. Beginning behind the toes and going to just below the knees, spiral wrap kerlex, then 4 inch ace wrap. Perform daily.   The patient should follow up with the [place] upon discharge. [facility] nurse will not follow at this time.  Please re-consult the [place] team if needed.  Val Riles, RN, MSN, CWOCN, CNS-BC, pager [phone]

## 2020-11-25 DIAGNOSIS — I4891 Unspecified atrial fibrillation: Secondary | ICD-10-CM | POA: Diagnosis not present

## 2020-11-25 DIAGNOSIS — K5641 Fecal impaction: Secondary | ICD-10-CM | POA: Diagnosis not present

## 2020-11-25 DIAGNOSIS — R778 Other specified abnormalities of plasma proteins: Secondary | ICD-10-CM | POA: Diagnosis not present

## 2020-11-25 DIAGNOSIS — R630 Anorexia: Secondary | ICD-10-CM

## 2020-11-25 DIAGNOSIS — I5033 Acute on chronic diastolic (congestive) heart failure: Secondary | ICD-10-CM | POA: Diagnosis not present

## 2020-11-25 LAB — BASIC METABOLIC PANEL
Anion gap: 10 (ref 5–15)
BUN: 49 mg/dL — ABNORMAL HIGH (ref 8–23)
CO2: 30 mmol/L (ref 22–32)
Calcium: 10.1 mg/dL (ref 8.9–10.3)
Chloride: 94 mmol/L — ABNORMAL LOW (ref 98–111)
Creatinine, Ser: 2.22 mg/dL — ABNORMAL HIGH (ref 0.44–1.00)
GFR, Estimated: 20 mL/min — ABNORMAL LOW (ref >60)
Glucose, Bld: 97 mg/dL (ref 70–99)
Potassium: 3.8 mmol/L (ref 3.5–5.1)
Sodium: 134 mmol/L — ABNORMAL LOW (ref 135–145)

## 2020-11-25 LAB — MAGNESIUM: Magnesium: 2.3 mg/dL (ref 1.7–2.4)

## 2020-11-25 LAB — CBC
HCT: 35.1 % — ABNORMAL LOW (ref 36.0–46.0)
Hemoglobin: 10.9 g/dL — ABNORMAL LOW (ref 12.0–15.0)
MCH: 28.4 pg (ref 26.0–34.0)
MCHC: 31.1 g/dL (ref 30.0–36.0)
MCV: 91.4 fL (ref 80.0–100.0)
Platelets: 164 10*3/uL (ref 150–400)
RBC: 3.84 MIL/uL — ABNORMAL LOW (ref 3.87–5.11)
RDW: 13.4 % (ref 11.5–15.5)
WBC: 5.7 10*3/uL (ref 4.0–10.5)
nRBC: 0 % (ref 0.0–0.2)

## 2020-11-25 NOTE — Progress Notes (Signed)
Subjective: Per nursing staff continues to have black stools.  No bright red blood per rectum.  Patient denies abdominal pain, nausea, vomiting.  2-3 bowel movements per day with MiraLAX.  Shortness of breath is improving, but remains on 3 L nasal cannula.  Overall, feels much better than when she was admitted to the hospital.  States she is ready to go home.    Discussed that we were needing to do an upper endoscopy prior to discharge, but would need to wait for warfarin to be held for 5 days.  Patient states she is not staying in the hospital any longer to wait on this procedure.  Objective: Vital signs in last 24 hours: Temp:  [98.2 F (36.8 C)-99.3 F (37.4 C)] 98.2 F (36.8 C) (06/21 0455) Pulse Rate:  [88-92] 92 (06/21 0455) Resp:  [16-22] 20 (06/21 0455) BP: (158-191)/(52-85) 191/52 (06/21 0851) SpO2:  [95 %-100 %] 96 % (06/21 0455) Last BM Date: 11/25/20 General:   Alert and oriented, pleasant Head:  Normocephalic and atraumatic. Eyes:  No icterus, sclera clear. Conjuctiva pink.  Abdomen:  Bowel sounds present, soft, non-tender, non-distended. No HSM or hernias noted. No rebound or guarding. No masses appreciated  Extremities:  Without edema. Neurologic:  Alert and  oriented x4;  grossly normal neurologically. Psych:  Normal mood and affect.  Intake/Output from previous day: 06/20 0701 - 06/21 0700 In: 480 [P.O.:480] Out: 300 [Urine:300] Intake/Output this shift: Total I/O In: 680 [P.O.:680] Out: -   Lab Results: Recent Labs    11/23/20 0634 11/24/20 0621 11/25/20 0455  WBC 9.1 8.4 5.7  HGB 11.3* 11.1* 10.9*  HCT 36.5 35.3* 35.1*  PLT 182 165 164   BMET Recent Labs    11/23/20 0634 11/24/20 0621 11/25/20 0455  NA 135 135 134*  K 4.1 3.7 3.8  CL 94* 95* 94*  CO2 '30 30 30  '$ GLUCOSE 101* 121* 97  BUN 45* 43* 49*  CREATININE 1.92* 1.86* 2.22*  CALCIUM 10.1 10.1 10.1   LFT Recent Labs    11/23/20 0634  PROT 7.3  ALBUMIN 4.2  AST 23  ALT 15   ALKPHOS 57  BILITOT 1.1   PT/INR Recent Labs    11/23/20 0634  LABPROT 21.4*  INR 1.9*    Assessment: 87 year old female with history of atrial fibrillation on warfarin, HTN, HLD, stroke, sick sinus syndrome, CKD stage IV, diastolic CHF, DM type II who presented to the emergency room 6/17 with constipation failing multiple OTC agents, nausea, melanotic stool, and reported 20 pound weight loss over the last 4 to 6 months due to decreased appetite.  GI consulted for further evaluation.  Also admitted with acute on chronic diastolic CHF with increased O2 requirements this admission which is being managed by hospitalist.  Constipation/fecal impaction: Found to have moderate stool burden with stool ball distending the rectum on abdominal x-ray completed 6/17.  No evidence of bowel obstruction or free air.  She was disimpacted in the emergency room and started on MiraLAX daily with significant improvement in constipation. Bowels are moving 2-3 times daily.   Melena: Reported melena on presentation to the emergency room.  Stools heme positive on admission and reportedly black, but patient had been taking p.o. iron which is likely contributing.  Hemoglobin 11.9 on admission, fairly consistent with her baseline over the last year.  Slow decline this admission with hemoglobin 10.9 this morning. Continues to pass black stools. Had been planning for EGD this admission for melena as  well as reported history of anorexia and weight loss once warfarin held x 5 days and medically stable, but now patient is declining EGD this admission. Discussed my concern of ongoing dark stools and slow decline in hemoglobin, but patient states she will not stay here until Thursday. Will discuss with Dr. Jenetta Downer timing of EGD.    Last dose of warfarin not clear. Possibly 6/18 prior to presenting to ED. Ideally, EGD would be on 6/23.   Acute on chronic CHF with fluid overload: Diuresed with furosemide 60 mg twice daily and  transitioned to torsemide 60 mg daily today. Clinically improving. Remains on 3L [place]. Peripheral edema improved.   Plan: Continue IV PPI every 12 hours. Had planned on EGD this admission once deemed stable from cardiac/respiratory standpoint and warfarin had been on hold x5 days.  EGD would likely be 6/23, but patient is now declining procedure.  She is requesting discharge today.  Will discuss with Dr. Jenetta Downer. Continue to monitor H&H and for ongoing GI bleeding. Continue MiraLAX daily to twice daily.   LOS: 3 days    11/25/2020, 10:12 AM   Aliene Altes, [facility]

## 2020-11-25 NOTE — Progress Notes (Addendum)
Patient is leaving AMA. MD Tat notified.

## 2020-11-25 NOTE — TOC Transition Note (Signed)
Transition of Care [facility]) - CM/SW Discharge Note   Patient Details  Name: [name] MRN: [id number] Date of Birth: November 18, 1926  Transition of [facility]) CM/SW Contact:  Shade Flood, LCSW Phone Number: 11/25/2020, 11:58 AM   Clinical Narrative:     Pt admitted from home and is stable for dc home today per MD. PT recommending HHPT. Pt also needing O2 for dc. Spoke with pt's daughter, [name], to review dc planning. Pt was active with [facility] prior to admission. Family agreeable to add PT. Family also agreeable to Home O2. Providers reviewed. Referred as requested. Once [facility] has all O2 needs addressed, pt can dc home.  No other TOC needs for dc.  Expected Discharge Plan: [place] Barriers to Discharge: Barriers Resolved   Patient Goals and CMS Choice Patient states their goals for this hospitalization and ongoing recovery are:: go home CMS Medicare.gov Compare Post Acute Care list provided to:: Patient Represenative (must comment) Choice offered to / list presented to : Adult Children  Expected Discharge Plan and Services Expected Discharge Plan: [place] In-house Referral: Clinical Social Work   Post Acute Care Choice: Resumption of Svcs/PTA Provider Living arrangements for the past 2 months: Single Family Home                           HH Arranged: PT, RN [place] Agency: [place] Date [place]: 11/25/20   Representative spoke with at [place]: Tommi Rumps  Prior Living Arrangements/Services Living arrangements for the past 2 months: [place] Lives with:: Adult Children, Spouse Patient language and need for interpreter reviewed:: Yes Do you feel safe going back to the place where you live?: Yes      Need for Family Participation in Patient Care: Yes (Comment) Care giver support system in place?: Yes (comment) Current home services: Home RN Criminal Activity/Legal Involvement Pertinent to  Current Situation/Hospitalization: No - Comment as needed  Activities of Daily Living Home Assistive Devices/Equipment: Dentures (specify type), Eyeglasses, Cane (specify quad or straight), Walker (specify type) ADL Screening (condition at time of admission) Patient's cognitive ability adequate to safely complete daily activities?: Yes Is the patient deaf or have difficulty hearing?: Yes Does the patient have difficulty seeing, even when wearing glasses/contacts?: No Does the patient have difficulty concentrating, remembering, or making decisions?: No Patient able to express need for assistance with ADLs?: Yes Does the patient have difficulty dressing or bathing?: Yes Independently performs ADLs?: No Communication: Independent Dressing (OT): Needs assistance Is this a change from baseline?: Pre-admission baseline Grooming: Needs assistance Is this a change from baseline?: Pre-admission baseline Feeding: Independent Bathing: Needs assistance Is this a change from baseline?: Pre-admission baseline Toileting: Independent In/Out Bed: Needs assistance Is this a change from baseline?: Pre-admission baseline Walks in Home: Independent with device (comment) Does the patient have difficulty walking or climbing stairs?: Yes Weakness of Legs: Both Weakness of Arms/Hands: None  Permission Sought/Granted Permission sought to share information with : Chartered certified accountant granted to share information with : Yes, Verbal Permission Granted     Permission granted to share info w AGENCY: [facility]        Emotional Assessment   Attitude/Demeanor/Rapport: Engaged Affect (typically observed): Pleasant Orientation: : Oriented to Self, Oriented to Place, Oriented to  Time, Oriented to Situation Alcohol / Substance Use: Not Applicable Psych Involvement: No (comment)  Admission diagnosis:  Melena [K92.1] Fecal  impaction ([place]) [K56.41] GI bleed [K92.2] Acute on chronic  diastolic congestive heart failure ([place]) [I50.33] Acute on chronic diastolic CHF (congestive heart failure) ([place]) [I50.33] Patient Active Problem List   Diagnosis Date Noted   GI bleed 11/22/2020   Nausea 11/22/2020   Elevated lipase 11/22/2020   Elevated troponin 11/22/2020   Elevated brain natriuretic peptide (BNP) level 11/22/2020   Acute on chronic diastolic CHF (congestive heart failure) ([place]) 11/22/2020   Fecal impaction (HCC)    Melena    Chronic kidney disease, stage IV (severe) ([place]) 10/21/2020   Blister 07/10/2020   Stage 3b chronic kidney disease ([place]) 05/05/2020   Aortic atherosclerosis ([place]) 05/05/2020   Pleural effusion due to CHF (congestive heart failure) ([place]) 08/28/2019   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   Anorexia 10/23/2018   Thyroid nodule 10/04/2017   Right sided weakness    Acute ischemic stroke ([place]) 09/20/2017   Controlled type 2 diabetes mellitus with complication, without long-term current use of insulin ([place]) 07/08/2016   Prediabetes 12/25/2015   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) 02/21/2009   Hyperlipidemia 11/26/2008   Constipation 10/31/2008   PCP:  Kathyrn Drown, MD Pharmacy:   [facility] [place] [id number] [place] [id number] Phone: [phone] Fax: [phone]     Social Determinants of Health (SDOH) Interventions    Readmission Risk Interventions Readmission Risk Prevention Plan 11/25/2020  Transportation Screening Complete  Home Care Screening Complete  Medication Review (RN CM)  Complete  Some recent data might be hidden     Final next level of care: Home w Home Health Services Barriers to Discharge: Barriers Resolved   Patient Goals and CMS Choice Patient states their goals for this hospitalization and ongoing recovery are:: go home CMS Medicare.gov Compare Post Acute Care list provided to:: Patient Represenative (must comment) Choice offered to / list presented to : Adult Children  Discharge Placement                       Discharge Plan and Services In-house Referral: Clinical Social Work   Post Acute Care Choice: Resumption of Svcs/PTA Provider                    HH Arranged: PT, RN [facility] Agency: [place] Date [facility] Agency Contacted: 11/25/20   Representative spoke with at [place]: [place] ([place]) Interventions     Readmission Risk Interventions Readmission Risk Prevention Plan 11/25/2020  Transportation Screening Complete  Home Care Screening Complete  Medication Review (RN CM) Complete  Some recent data might be hidden

## 2020-11-25 NOTE — Evaluation (Addendum)
Physical Therapy Evaluation Patient Details Name: [name] MRN: [id number] DOB: 05/06/1927 Today's Date: 11/25/2020   History of Present Illness  [name] is a 87 y.o. female with medical history significant for atrial fibrillation on Coumadin, hypertension, hyperlipidemia, prior CVA, pacemaker, CKD stage IV, pulmonary hypertension who presents to the emergency department via EMS with complaint of about 2 weeks of of constipation, though she states that she takes iron tablet daily, and states that she noted that her stool has been black.  She tried MiraLAX and magnesium citrate as recommended by PCP at home without relief.  She complained of diffuse abdominal pain with pain, she states that her daughter attempted an enema at home without relief.  She also complained of 2 to 3-week onset of increasing shortness of breath and increased leg swelling which was wrapped up with an Ace bandage, shortness of breath worsens on ambulation (ambulates with a cane and walker at baseline).  She denies fever, chills, chest pain, nausea or vomiting.  She was seen by PCP on 6/14 where the echo done was suggestive of pulmonary artery hypertension and aortic was transferred to really increase due to paroxysmal nocturnal dyspnea and pedal edema.   Clinical Impression  Patient demonstrates good return for sitting up at bedside and slightly apprehensive to attempt gait training due to SOB.  Patient able to ambulate in room without loss of balance while on room air with SpO2 dropping from 90% to 78%, limited mostly due to fatigue, SOB and tolerated sitting up in chair after therapy, put back on supplemental O2 - nursing staff notified.  Patient will benefit from continued physical therapy in hospital and recommended venue below to increase strength, balance, endurance for safe ADLs and gait.     Follow Up Recommendations Home health PT;Supervision for mobility/OOB;Supervision - Intermittent    Equipment  Recommendations  None recommended by PT    Recommendations for Other Services       Precautions / Restrictions Precautions Precautions: Fall Restrictions Weight Bearing Restrictions: No      Mobility  Bed Mobility Overal bed mobility: Modified Independent                  Transfers Overall transfer level: Needs assistance Equipment used: Rolling walker (2 wheeled) Transfers: Sit to/from [employer] Transfers Sit to Stand: Supervision;Min guard Stand pivot transfers: Supervision;Min guard       General transfer comment: increased time, labored movement  Ambulation/Gait Ambulation/Gait assistance: Supervision;Min guard Gait Distance (Feet): 25 Feet Assistive device: Rolling walker (2 wheeled) Gait Pattern/deviations: Decreased step length - right;Decreased step length - left;Decreased stride length Gait velocity: decreased   General Gait Details: slow labored cadence without loss of balance, limited mostly due to fatigue, on room air with SpO2 dropping from 90% to 78%  Stairs            Wheelchair Mobility    Modified Rankin (Stroke Patients Only)       Balance Overall balance assessment: Needs assistance Sitting-balance support: Feet supported;No upper extremity supported Sitting balance-Leahy Scale: Good Sitting balance - Comments: seated at EOB   Standing balance support: During functional activity;Bilateral upper extremity supported Standing balance-Leahy Scale: Fair Standing balance comment: using RW                             Pertinent Vitals/Pain Pain Assessment: No/denies pain    Home Living Family/patient expects to be discharged to:: Private residence  Living Arrangements: Spouse/significant other Available Help at Discharge: Family;Available 24 hours/day Type of Home: House Home Access: Stairs to enter Entrance Stairs-Rails: Right;Left;Can reach both Entrance Stairs-Number of Steps: 2 Home Layout: One  level Home Equipment: Walker - 2 wheels;Wheelchair - manual;Bedside commode;Grab bars - tub/shower;Grab bars - toilet;Hand held shower head;Cane - single point;Shower seat - built in      Prior Function Level of Independence: Needs assistance   Gait / Transfers Assistance Needed: household ambulator using RW  ADL's / Homemaking Assistance Needed: assisted by family        Hand Dominance   Dominant Hand: Right    Extremity/Trunk Assessment   Upper Extremity Assessment Upper Extremity Assessment: Generalized weakness    Lower Extremity Assessment Lower Extremity Assessment: Generalized weakness    Cervical / Trunk Assessment Cervical / Trunk Assessment: Normal  Communication   Communication: No difficulties  Cognition Arousal/Alertness: Awake/alert Behavior During Therapy: WFL for tasks assessed/performed Overall Cognitive Status: Within Functional Limits for tasks assessed                                        General Comments      Exercises     Assessment/Plan    PT Assessment Patient needs continued PT services  PT Problem List Decreased strength;Decreased activity tolerance;Decreased balance;Decreased mobility       PT Treatment Interventions DME instruction;Gait training;Stair training;Functional mobility training;Therapeutic activities;Therapeutic exercise;Patient/family education;Balance training    PT Goals (Current goals can be found in the Care Plan section)  Acute Rehab PT Goals Patient Stated Goal: return home with family to assist PT Goal Formulation: With patient Time For Goal Achievement: 12/02/20 Potential to Achieve Goals: Good    Frequency Min 3X/week   Barriers to discharge        Co-evaluation               AM-PAC PT "6 Clicks" Mobility  Outcome Measure Help needed turning from your back to your side while in a flat bed without using bedrails?: None Help needed moving from lying on your back to sitting on  the side of a flat bed without using bedrails?: None Help needed moving to and from a bed to a chair (including a wheelchair)?: A Little Help needed standing up from a chair using your arms (e.g., wheelchair or bedside chair)?: A Little Help needed to walk in hospital room?: A Little Help needed climbing 3-5 steps with a railing? : A Lot 6 Click Score: 19    End of Session Equipment Utilized During Treatment: Oxygen Activity Tolerance: Patient tolerated treatment well;Patient limited by fatigue Patient left: in chair;with call bell/phone within reach;with chair alarm set;with family/visitor present Nurse Communication: Mobility status PT Visit Diagnosis: Unsteadiness on feet (R26.81);Other abnormalities of gait and mobility (R26.89);Muscle weakness (generalized) (M62.81)    Time: [id number] PT Time Calculation (min) (ACUTE ONLY): 29 min   Charges:   PT Evaluation $PT Eval Moderate Complexity: 1 Mod PT Treatments $Therapeutic Activity: 23-37 mins       12:14 PM, 11/25/20 Lonell Grandchild, MPT Physical Therapist with [facility] 336 [phone] office [phone] mobile phone

## 2020-11-25 NOTE — Progress Notes (Signed)
SATURATION QUALIFICATIONS: (This note is used to comply with regulatory documentation for home oxygen)  Patient Saturations on Room Air at Rest = 90%  Patient Saturations on Room Air while Ambulating = 78%  Patient Saturations on 4 Liters of oxygen while Ambulating = 94%  Please briefly explain why patient needs home oxygen: Patient is SOB and O2 saturation drops while ambulating.

## 2020-11-25 NOTE — Plan of Care (Signed)
  Problem: Acute Rehab PT Goals(only PT should resolve) Goal: Pt Will Go Supine/Side To Sit Outcome: Progressing Flowsheets (Taken 11/25/2020 1216) Pt will go Supine/Side to Sit:  Independently  with modified independence Goal: Patient Will Transfer Sit To/From Stand Outcome: Progressing Flowsheets (Taken 11/25/2020 1216) Patient will transfer sit to/from stand: with supervision Goal: Pt Will Transfer Bed To Chair/Chair To Bed Outcome: Progressing Flowsheets (Taken 11/25/2020 1216) Pt will Transfer Bed to Chair/Chair to Bed: with supervision Goal: Pt Will Ambulate Outcome: Progressing Flowsheets (Taken 11/25/2020 1216) Pt will Ambulate:  50 feet  with supervision  with rolling walker   12:17 PM, 11/25/20 Lonell Grandchild, MPT Physical Therapist with [facility] 336 [phone] office [phone] mobile phone

## 2020-11-25 NOTE — Discharge Summary (Signed)
Physician Discharge Summary  [name] [name] DOB: 09-16-1926 DOA: 11/21/2020  PCP: Meghan Drown, MD  Admit date: 11/21/2020 Discharge date: 11/25/2020  Admitted From: Home Disposition:  AGAINST MEDICAL ADVICE     Equipment/Devices: 2.5L oxygen  Discharge Condition: Stable CODE STATUS:FULL Diet recommendation: Heart Healthy   Brief/Interim Summary: 87 year old female with a history of a persistent atrial fibrillation on warfarin, hypertension, hyperlipidemia, stroke, sick sinus syndrome status post pacemaker, CKD stage IV, diastolic CHF, diabetes mellitus type 2, stroke presenting with constipation and nausea.  Apparently, patient had an episode of dry heaving earlier in the week.  The patient states that she has tried numerous over-the-counter remedies to help her constipation including mag citrate, MiraLAX, and enema without success.  In addition, the patient has also noted some melanotic stools.  She denies any NSAID use. Upon further questioning, the patient states that she has had some intermittent shortness of breath for the better part of the last 4 to 5 months.  She states that it has not really changed, but she does have dyspnea on exertion.  She endorses some orthopnea type symptoms.  She states that her lower extremity edema is about the same as usual.  She has been going to the wound care center for her venous stasis dermatitis and leg wound she denies any fevers, chills, chest pain, coughing, hemoptysis, hematochezia, dysuria, hematuria. In the emergency department, the patient had low-grade temperature of 99 1 F.  She was hemodynamically stable she was noted to have atrial fibrillation with RVR.  The patient was placed on 2 L nasal cannula.  Chest x-ray showed pulmonary vascular congestion with bilateral pleural effusions, R>left.  Abdominal x-ray showed moderate stool with the rectum distended with stool ball. She was given an enema after which she had a BM.   She was started on IV lasix with good clinical improvement. GI was consulted for her melena.  Her warfarin was held.  GI wanted to perform EGD on 6/23 after 5 days off warfarin, but patient wanted to go home and did not want to wait another 2 days.  She signed out against medical advice   In speaking with [name], [name] has demonstrated the ability to understand his medical condition(s) which include CHF and GI bleed.  [name] has demonstrated the ability to appreciate how treatment for CHF and GI bleed. will be beneficial.   [name] has also demonstrated the ability to understand and appreciate how refusal of treatement for CHF and GI bleed. could result in harm, repeat hospitalization, and possibly death.  [name] demonstrates the ability to reason through the risks and benefits of the proposed treatment.  Finally, [name] is able to clearly communicate his/her choice.     Discharge Diagnoses:  Acute on chronic diastolic CHF -Patient remains clinically fluid overloaded -Continue IV furosemide 60 mg twice daily -plan to transition to po torsemide 60 mg daily 6/21 -remains clinically fluid overloaded -Echo EF 70-75%; mod TR and mod pulm HTN -Accurate I's and O's -Daily weights   FOBT positive/melena -Holding warfarin -GI consult appreciated-->possible EGD 6/23 -Hemoglobin remained stable -Continue PPI -discussed with [name] -patient did want to wait until 6/23 -she left against medical advice   Fecal impaction -Patient states that she had a BM on the evening of 11/21/2020 -GI consult appreciated -Continue MiraLAX--increased to twice daily   Persistent atrial fibrillation -Continue Cardizem CD -Holding warfarin secondary to concerns of GI  bleed   CKD stage IV -Baseline creatinine 2.1-2.4 -Monitored with diuresis -serum creatinine 2.22 on day of d/c   Tachybradycardia syndrome -Status post permanent pacemaker  2010 -Patient follows with [name]   Essential hypertension -Continue diltiazem CD   Hyperlipidemia -Continue statin   Venous stasis dermatitis -appreciate WOCN consult and recommendations -R-leg changed 6/20   Discharge Instructions   Allergies as of 11/25/2020       Reactions   Tramadol    Drowsiness        Medication List     STOP taking these medications    allopurinol 100 MG tablet Commonly known as: ZYLOPRIM   amLODipine 5 MG tablet Commonly known as: NORVASC   ferrous sulfate 324 (65 Fe) MG Tbec   meclizine 25 MG tablet Commonly known as: ANTIVERT       TAKE these medications    albuterol 108 (90 Base) MCG/ACT inhaler Commonly known as: VENTOLIN HFA TAKE 2 PUFFS EVERY 6 HOURS AS NEEDED   diltiazem 360 MG 24 hr capsule Commonly known as: TIAZAC TAKE ONE CAPSULE BY MOUTH ONCE DAILY.   mupirocin ointment 2 % Commonly known as: BACTROBAN Apply thin amount daily to skin blister   pravastatin 80 MG tablet Commonly known as: PRAVACHOL TAKE (1) TABLET BY MOUTH AT BEDTIME.   torsemide 20 MG tablet Commonly known as: DEMADEX TAKE 3 TABLETS BY MOUTH DAILY   Travoprost (BAK Free) 0.004 % Soln ophthalmic solution Commonly known as: TRAVATAN Place 1 drop into both eyes at bedtime.   VITAMIN D (CHOLECALCIFEROL) PO Take 1 tablet by mouth every morning.   warfarin 5 MG tablet Commonly known as: COUMADIN Take as directed. If you are unsure how to take this medication, talk to your nurse or doctor. Original instructions: TAKE 1/2 TO 1 TABLET BY MOUTH DAILY OR AS DIRECTED.               Durable Medical Equipment  (From admission, onward)           Start     Ordered   11/25/20 1257  For home use only DME oxygen  Once       Question Answer Comment  Length of Need 6 Months   Mode or (Route) Nasal cannula   Liters per Minute 2.5   Frequency Continuous (stationary and portable oxygen unit needed)   Oxygen conserving device Yes    Oxygen delivery system Gas      11/25/20 1256            Follow-up Information     [facility].   Specialty: Home Health Services Why: [facility] staff will call you to arrange Home nursing and physical therapy visits Contact information: [address] [place] [address] [phone]                Allergies  Allergen Reactions   Tramadol     Drowsiness    Consultations: GI   Procedures/Studies: DG Abdomen Acute W/Chest  Result Date: 11/21/2020 CLINICAL DATA:  Constipation. EXAM: DG ABDOMEN ACUTE WITH 1 VIEW CHEST COMPARISON:  Chest radiograph 06/05/2020, abdominal CT 10/10/2018 FINDINGS: Dual lead left-sided pacemaker in place. Stable cardiomegaly. Aortic atherosclerosis and tortuosity. Bilateral pleural effusions with improvement on the left but worsening on the right from prior exam. Associated bibasilar volume loss/atelectasis. Pulmonary vascular congestion, also seen on prior. Minimal fluid tracks into the right minor fissure. Bilateral shoulder arthropathy. No bowel dilatation to suggest obstruction.  No free intra-abdominal air. Moderate stool in the ascending, transverse, proximal descending colon. Stool balls distending the rectum, mild rectal distention of 6.5 cm. Popcorn calcifications in the pelvis correspond to calcified fibroids. Calcifications to the right of L2-L3 correspond to right renal parenchymal calcification. Prominent vascular calcifications. No acute osseous abnormalities are seen. IMPRESSION: 1. Bilateral pleural effusions with improvement on the left and worsening on the right from December 2021 radiograph. 2. Cardiomegaly with vascular congestion. 3. Moderate stool burden, with stool ball distending the rectum, suggesting constipation and possible fecal impaction. Mild rectal distention of 6.5 cm. No evidence of bowel obstruction or free air. Electronically Signed   By: Keith Rake M.D.   On:  11/21/2020 23:19   ECHOCARDIOGRAM COMPLETE  Result Date: 11/22/2020    ECHOCARDIOGRAM REPORT   Patient Name:   [name] Date of Exam: 11/22/2020 Medical Rec #:  [id number]         Height:       63.0 in Accession #:    [id number]        Weight:       160.0 lb Date of Birth:  April 17, 1927         BSA:          1.759 m Patient Age:    53 years          BP:           188/71 mmHg Patient Gender: F                 HR:           86 bpm. Exam Location:  [facility] Procedure: 2D Echo, Color Doppler and Cardiac Doppler Indications:    CHF-Acute Diastolic [id number]  History:        Patient has prior history of Echocardiogram examinations, most                 recent 11/18/2020. Stroke, Mitral Valve Disease,                 Arrythmias:Atrial Fibrillation, Signs/Symptoms:Shortness of                 Breath; Risk Factors:Dyslipidemia and Hypertension.  Sonographer:    Bernadene Person RDCS Referring Phys: [phone] Dally Oshel IMPRESSIONS  1. Left ventricular ejection fraction, by estimation, is 70 to 75%. The left ventricle has hyperdynamic function. Left ventricular endocardial border not optimally defined to evaluate regional wall motion. There is severe left ventricular hypertrophy. Left ventricular diastolic parameters are indeterminate.  2. Right ventricular systolic function is normal. The right ventricular size is normal. There is moderately elevated pulmonary artery systolic pressure.  3. Left atrial size was severely dilated.  4. A small pericardial effusion is present. The pericardial effusion is circumferential. There is no evidence of cardiac tamponade.  5. The mitral valve is abnormal. Mild mitral valve regurgitation. Severe mitral stenosis. Severe mitral annular calcification. The mean mitral valve gradient is 13.0 mmHg, HR 89 bpm  6. The tricuspid valve is abnormal. Tricuspid valve regurgitation is moderate.  7. The aortic valve is tricuspid. There is mild calcification of the aortic valve. There is mild thickening of  the aortic valve. Aortic valve regurgitation is not visualized. No aortic stenosis is present.  8. Right atrial size was moderately dilated.  9. The inferior vena cava is normal in size with greater than 50% respiratory variability, suggesting right atrial pressure of 3 mmHg. 10. Moderate pulmonary HTN, PASP is 58 mmHg.  FINDINGS  Left Ventricle: Left ventricular ejection fraction, by estimation, is 70 to 75%. The left ventricle has hyperdynamic function. Left ventricular endocardial border not optimally defined to evaluate regional wall motion. The left ventricular internal cavity size was normal in size. There is severe left ventricular hypertrophy. Left ventricular diastolic parameters are indeterminate. Right Ventricle: The right ventricular size is normal. No increase in right ventricular wall thickness. Right ventricular systolic function is normal. There is moderately elevated pulmonary artery systolic pressure. The tricuspid regurgitant velocity is 3.74 m/s, and with an assumed right atrial pressure of 3 mmHg, the estimated right ventricular systolic pressure is 123456 mmHg. Left Atrium: Left atrial size was severely dilated. Right Atrium: Right atrial size was moderately dilated. Pericardium: A small pericardial effusion is present. The pericardial effusion is circumferential. There is no evidence of cardiac tamponade. Mitral Valve: The mitral valve is abnormal. There is moderate thickening of the mitral valve leaflet(s). There is moderate calcification of the mitral valve leaflet(s). Severe mitral annular calcification. Mild mitral valve regurgitation. Severe mitral valve stenosis. MV peak gradient, 29.8 mmHg. The mean mitral valve gradient is 13.0 mmHg. Tricuspid Valve: The tricuspid valve is abnormal. Tricuspid valve regurgitation is moderate . No evidence of tricuspid stenosis. Aortic Valve: The aortic valve is tricuspid. There is mild calcification of the aortic valve. There is mild thickening of the  aortic valve. There is mild aortic valve annular calcification. Aortic valve regurgitation is not visualized. No aortic stenosis  is present. Pulmonic Valve: The pulmonic valve was not well visualized. Pulmonic valve regurgitation is not visualized. No evidence of pulmonic stenosis. Aorta: The aortic root is normal in size and structure. Pulmonary Artery: Moderate pulmonary HTN, PASP is 58 mmHg. Venous: The inferior vena cava is normal in size with greater than 50% respiratory variability, suggesting right atrial pressure of 3 mmHg. IAS/Shunts: The interatrial septum was not well visualized.  LEFT VENTRICLE PLAX 2D LVIDd:         3.16 cm LVIDs:         2.22 cm LV PW:         1.50 cm LV IVS:        1.50 cm LVOT diam:     1.60 cm LV SV:         43 LV SV Index:   24 LVOT Area:     2.01 cm  RIGHT VENTRICLE RV S prime:     10.60 cm/s TAPSE (M-mode): 1.1 cm LEFT ATRIUM              Index       RIGHT ATRIUM           Index LA diam:        5.30 cm  3.01 cm/m  RA Area:     22.10 cm LA Vol (A2C):   86.1 ml  48.96 ml/m RA Volume:   59.30 ml  33.72 ml/m LA Vol (A4C):   96.3 ml  54.76 ml/m LA Biplane Vol: 100.0 ml 56.86 ml/m  AORTIC VALVE LVOT Vmax:   98.80 cm/s LVOT Vmean:  77.700 cm/s LVOT VTI:    0.213 m  AORTA Ao Root diam: 2.50 cm Ao Asc diam:  2.70 cm MITRAL VALVE              TRICUSPID VALVE MV Area (PHT): 1.62 cm   TR Peak grad:   56.0 mmHg MV Area VTI:   0.52 cm   TR Vmax:  374.00 cm/s MV Peak grad:  29.8 mmHg MV Mean grad:  13.0 mmHg  SHUNTS MV Vmax:       2.73 m/s   Systemic VTI:  0.21 m MV Vmean:      171.0 cm/s Systemic Diam: 1.60 cm Carlyle Dolly MD Electronically signed by Carlyle Dolly MD Signature Date/Time: 11/22/2020/2:59:35 PM    Final    ECHOCARDIOGRAM COMPLETE  Result Date: 11/18/2020    ECHOCARDIOGRAM REPORT   Patient Name:   [name] Date of Exam: 11/18/2020 Medical Rec #:  [id number]         Height:       63.0 in Accession #:    [id number]        Weight:       163.0 lb Date  of Birth:  February 05, 1927         BSA:          1.773 m Patient Age:    48 years          BP:           161/64 mmHg Patient Gender: F                 HR:           74 bpm. Exam Location:  [facility] Procedure: 2D Echo, Cardiac Doppler and Color Doppler Indications:    R06.00 (ICD-10-CM) - DOE (dyspnea on exertion)                 R60.0 (ICD-10-CM) - Pedal edema  History:        Patient has prior history of Echocardiogram examinations, most                 recent 09/03/2019. CHF, Pacemaker, Stroke, Mitral Valve Disease,                 Arrythmias:Atrial Flutter; Risk Factors:Hypertension, Diabetes                 and Dyslipidemia. Chronic anticoagulation,Sick sinus syndrome.  Sonographer:    Alvino Chapel RCS Referring Phys: [phone] SCOTT A Hatillo  1. Left ventricular ejection fraction, by estimation, is 60 to 65%. The left ventricle has normal function. The left ventricle has no regional wall motion abnormalities. There is moderate left ventricular hypertrophy. Left ventricular diastolic parameters are indeterminate.  2. Right ventricular systolic function is low normal. The right ventricular size is mildly enlarged. There is severely elevated pulmonary artery systolic pressure.  3. Left atrial size was severely dilated.  4. Right atrial size was severely dilated.  5. A small pericardial effusion is present. The pericardial effusion is circumferential.  6. The mitral valve is abnormal. Mild mitral valve regurgitation. Moderate mitral stenosis. Severe mitral annular calcification.  7. The tricuspid valve is abnormal. Tricuspid valve regurgitation is moderate.  8. The aortic valve is tricuspid. There is moderate calcification of the aortic valve. There is moderate thickening of the aortic valve. Aortic valve regurgitation is not visualized. Mild to moderate aortic valve stenosis.  9. The inferior vena cava is normal in size with greater than 50% respiratory variability, suggesting right atrial pressure of 3 mmHg.  FINDINGS  Left Ventricle: Left ventricular ejection fraction, by estimation, is 60 to 65%. The left ventricle has normal function. The left ventricle has no regional wall motion abnormalities. The left ventricular internal cavity size was normal in size. There is  moderate left ventricular hypertrophy. Left ventricular diastolic parameters are indeterminate. Right Ventricle:  The right ventricular size is mildly enlarged. Right vetricular wall thickness was not assessed. Right ventricular systolic function is low normal. There is severely elevated pulmonary artery systolic pressure. The tricuspid regurgitant velocity is 3.89 m/s, and with an assumed right atrial pressure of 8 mmHg, the estimated right ventricular systolic pressure is 0000000 mmHg. Left Atrium: Left atrial size was severely dilated. Right Atrium: Right atrial size was severely dilated. Pericardium: A small pericardial effusion is present. The pericardial effusion is circumferential. Mitral Valve: The mitral valve is abnormal. There is moderate thickening of the mitral valve leaflet(s). There is moderate calcification of the mitral valve leaflet(s). Severe mitral annular calcification. Mild mitral valve regurgitation. Moderate mitral  valve stenosis. MV peak gradient, 30.2 mmHg. The mean mitral valve gradient is 6.0 mmHg. Tricuspid Valve: The tricuspid valve is abnormal. Tricuspid valve regurgitation is moderate . No evidence of tricuspid stenosis. Aortic Valve: The aortic valve is tricuspid. There is moderate calcification of the aortic valve. There is moderate thickening of the aortic valve. There is moderate aortic valve annular calcification. Aortic valve regurgitation is not visualized. Mild to moderate aortic stenosis is present. Aortic valve mean gradient measures 12.0 mmHg. Aortic valve peak gradient measures 19.7 mmHg. Aortic valve area, by VTI measures 1.19 cm. Pulmonic Valve: The pulmonic valve was not well visualized. Pulmonic valve  regurgitation is mild. No evidence of pulmonic stenosis. Aorta: The aortic root is normal in size and structure. Pulmonary Artery: Severe pulmonary HTN, PASP is 68 mmHg. Venous: The inferior vena cava is normal in size with greater than 50% respiratory variability, suggesting right atrial pressure of 3 mmHg. IAS/Shunts: No atrial level shunt detected by color flow Doppler. Additional Comments: A device lead is visualized.  LEFT VENTRICLE PLAX 2D LVIDd:         3.80 cm LVIDs:         1.90 cm LV PW:         1.40 cm LV IVS:        1.30 cm LVOT diam:     1.80 cm LV SV:         66 LV SV Index:   37 LVOT Area:     2.54 cm  RIGHT VENTRICLE RV S prime:     8.05 cm/s TAPSE (M-mode): 1.5 cm LEFT ATRIUM              Index       RIGHT ATRIUM           Index LA diam:        5.10 cm  2.88 cm/m  RA Area:     31.30 cm LA Vol (A2C):   114.0 ml 64.31 ml/m RA Volume:   94.60 ml  53.37 ml/m LA Vol (A4C):   96.5 ml  54.44 ml/m LA Biplane Vol: 109.0 ml 61.49 ml/m  AORTIC VALVE AV Area (Vmax):    1.23 cm AV Area (Vmean):   1.23 cm AV Area (VTI):     1.19 cm AV Vmax:           222.00 cm/s AV Vmean:          164.000 cm/s AV VTI:            0.554 m AV Peak Grad:      19.7 mmHg AV Mean Grad:      12.0 mmHg LVOT Vmax:         107.00 cm/s LVOT Vmean:        79.200 cm/s LVOT  VTI:          0.258 m LVOT/AV VTI ratio: 0.47  AORTA Ao Root diam: 2.70 cm MITRAL VALVE                TRICUSPID VALVE MV Area (PHT): 2.05 cm     TR Peak grad:   60.5 mmHg MV Peak grad:  30.2 mmHg    TR Vmax:        389.00 cm/s MV Mean grad:  6.0 mmHg MV Vmax:       2.75 m/s     SHUNTS MV Vmean:      92.3 cm/s    Systemic VTI:  0.26 m MV Decel Time: 370 msec     Systemic Diam: 1.80 cm MV E velocity: 268.00 cm/s Carlyle Dolly MD Electronically signed by Carlyle Dolly MD Signature Date/Time: 11/18/2020/4:09:15 PM    Final         Discharge Exam: Vitals:   11/25/20 0851 11/25/20 1517  BP: (!) 191/52 (!) 156/82  Pulse:  82  Resp:  18  Temp:  98.2 F  (36.8 C)  SpO2:  100%   Vitals:   11/24/20 2003 11/25/20 0455 11/25/20 0851 11/25/20 1517  BP:  (!) 158/67 (!) 191/52 (!) 156/82  Pulse:  92  82  Resp:  20  18  Temp:  98.2 F (36.8 C)  98.2 F (36.8 C)  TempSrc:    Oral  SpO2: 95% 96%  100%  Weight:      Height:        General: Pt is alert, awake, not in acute distress Cardiovascular: RRR, S1/S2 +, no rubs, no gallops Respiratory: CTA bilaterally, no wheezing, no rhonchi Abdominal: Soft, NT, ND, bowel sounds + Extremities: no edema, no cyanosis   The results of significant diagnostics from this hospitalization (including imaging, microbiology, ancillary and laboratory) are listed below for reference.    Significant Diagnostic Studies: DG Abdomen Acute W/Chest  Result Date: 11/21/2020 CLINICAL DATA:  Constipation. EXAM: DG ABDOMEN ACUTE WITH 1 VIEW CHEST COMPARISON:  Chest radiograph 06/05/2020, abdominal CT 10/10/2018 FINDINGS: Dual lead left-sided pacemaker in place. Stable cardiomegaly. Aortic atherosclerosis and tortuosity. Bilateral pleural effusions with improvement on the left but worsening on the right from prior exam. Associated bibasilar volume loss/atelectasis. Pulmonary vascular congestion, also seen on prior. Minimal fluid tracks into the right minor fissure. Bilateral shoulder arthropathy. No bowel dilatation to suggest obstruction. No free intra-abdominal air. Moderate stool in the ascending, transverse, proximal descending colon. Stool balls distending the rectum, mild rectal distention of 6.5 cm. Popcorn calcifications in the pelvis correspond to calcified fibroids. Calcifications to the right of L2-L3 correspond to right renal parenchymal calcification. Prominent vascular calcifications. No acute osseous abnormalities are seen. IMPRESSION: 1. Bilateral pleural effusions with improvement on the left and worsening on the right from December 2021 radiograph. 2. Cardiomegaly with vascular congestion. 3. Moderate stool  burden, with stool ball distending the rectum, suggesting constipation and possible fecal impaction. Mild rectal distention of 6.5 cm. No evidence of bowel obstruction or free air. Electronically Signed   By: Keith Rake M.D.   On: 11/21/2020 23:19   ECHOCARDIOGRAM COMPLETE  Result Date: 11/22/2020    ECHOCARDIOGRAM REPORT   Patient Name:   [name] Date of Exam: 11/22/2020 Medical Rec #:  [id number]         Height:       63.0 in Accession #:    [id number]        Weight:  160.0 lb Date of Birth:  1926/12/29         BSA:          1.759 m Patient Age:    53 years          BP:           188/71 mmHg Patient Gender: F                 HR:           86 bpm. Exam Location:  [facility] Procedure: 2D Echo, Color Doppler and Cardiac Doppler Indications:    CHF-Acute Diastolic [id number]  History:        Patient has prior history of Echocardiogram examinations, most                 recent 11/18/2020. Stroke, Mitral Valve Disease,                 Arrythmias:Atrial Fibrillation, Signs/Symptoms:Shortness of                 Breath; Risk Factors:Dyslipidemia and Hypertension.  Sonographer:    Bernadene Person RDCS Referring Phys: [phone] Takeo Harts IMPRESSIONS  1. Left ventricular ejection fraction, by estimation, is 70 to 75%. The left ventricle has hyperdynamic function. Left ventricular endocardial border not optimally defined to evaluate regional wall motion. There is severe left ventricular hypertrophy. Left ventricular diastolic parameters are indeterminate.  2. Right ventricular systolic function is normal. The right ventricular size is normal. There is moderately elevated pulmonary artery systolic pressure.  3. Left atrial size was severely dilated.  4. A small pericardial effusion is present. The pericardial effusion is circumferential. There is no evidence of cardiac tamponade.  5. The mitral valve is abnormal. Mild mitral valve regurgitation. Severe mitral stenosis. Severe mitral annular calcification. The mean  mitral valve gradient is 13.0 mmHg, HR 89 bpm  6. The tricuspid valve is abnormal. Tricuspid valve regurgitation is moderate.  7. The aortic valve is tricuspid. There is mild calcification of the aortic valve. There is mild thickening of the aortic valve. Aortic valve regurgitation is not visualized. No aortic stenosis is present.  8. Right atrial size was moderately dilated.  9. The inferior vena cava is normal in size with greater than 50% respiratory variability, suggesting right atrial pressure of 3 mmHg. 10. Moderate pulmonary HTN, PASP is 58 mmHg. FINDINGS  Left Ventricle: Left ventricular ejection fraction, by estimation, is 70 to 75%. The left ventricle has hyperdynamic function. Left ventricular endocardial border not optimally defined to evaluate regional wall motion. The left ventricular internal cavity size was normal in size. There is severe left ventricular hypertrophy. Left ventricular diastolic parameters are indeterminate. Right Ventricle: The right ventricular size is normal. No increase in right ventricular wall thickness. Right ventricular systolic function is normal. There is moderately elevated pulmonary artery systolic pressure. The tricuspid regurgitant velocity is 3.74 m/s, and with an assumed right atrial pressure of 3 mmHg, the estimated right ventricular systolic pressure is 123456 mmHg. Left Atrium: Left atrial size was severely dilated. Right Atrium: Right atrial size was moderately dilated. Pericardium: A small pericardial effusion is present. The pericardial effusion is circumferential. There is no evidence of cardiac tamponade. Mitral Valve: The mitral valve is abnormal. There is moderate thickening of the mitral valve leaflet(s). There is moderate calcification of the mitral valve leaflet(s). Severe mitral annular calcification. Mild mitral valve regurgitation. Severe mitral valve stenosis. MV peak gradient, 29.8 mmHg. The mean mitral valve gradient  is 13.0 mmHg. Tricuspid Valve: The  tricuspid valve is abnormal. Tricuspid valve regurgitation is moderate . No evidence of tricuspid stenosis. Aortic Valve: The aortic valve is tricuspid. There is mild calcification of the aortic valve. There is mild thickening of the aortic valve. There is mild aortic valve annular calcification. Aortic valve regurgitation is not visualized. No aortic stenosis  is present. Pulmonic Valve: The pulmonic valve was not well visualized. Pulmonic valve regurgitation is not visualized. No evidence of pulmonic stenosis. Aorta: The aortic root is normal in size and structure. Pulmonary Artery: Moderate pulmonary HTN, PASP is 58 mmHg. Venous: The inferior vena cava is normal in size with greater than 50% respiratory variability, suggesting right atrial pressure of 3 mmHg. IAS/Shunts: The interatrial septum was not well visualized.  LEFT VENTRICLE PLAX 2D LVIDd:         3.16 cm LVIDs:         2.22 cm LV PW:         1.50 cm LV IVS:        1.50 cm LVOT diam:     1.60 cm LV SV:         43 LV SV Index:   24 LVOT Area:     2.01 cm  RIGHT VENTRICLE RV S prime:     10.60 cm/s TAPSE (M-mode): 1.1 cm LEFT ATRIUM              Index       RIGHT ATRIUM           Index LA diam:        5.30 cm  3.01 cm/m  RA Area:     22.10 cm LA Vol (A2C):   86.1 ml  48.96 ml/m RA Volume:   59.30 ml  33.72 ml/m LA Vol (A4C):   96.3 ml  54.76 ml/m LA Biplane Vol: 100.0 ml 56.86 ml/m  AORTIC VALVE LVOT Vmax:   98.80 cm/s LVOT Vmean:  77.700 cm/s LVOT VTI:    0.213 m  AORTA Ao Root diam: 2.50 cm Ao Asc diam:  2.70 cm MITRAL VALVE              TRICUSPID VALVE MV Area (PHT): 1.62 cm   TR Peak grad:   56.0 mmHg MV Area VTI:   0.52 cm   TR Vmax:        374.00 cm/s MV Peak grad:  29.8 mmHg MV Mean grad:  13.0 mmHg  SHUNTS MV Vmax:       2.73 m/s   Systemic VTI:  0.21 m MV Vmean:      171.0 cm/s Systemic Diam: 1.60 cm Carlyle Dolly MD Electronically signed by Carlyle Dolly MD Signature Date/Time: 11/22/2020/2:59:35 PM    Final    ECHOCARDIOGRAM  COMPLETE  Result Date: 11/18/2020    ECHOCARDIOGRAM REPORT   Patient Name:   [name] Date of Exam: 11/18/2020 Medical Rec #:  [id number]         Height:       63.0 in Accession #:    [id number]        Weight:       163.0 lb Date of Birth:  09/06/1926         BSA:          1.773 m Patient Age:    26 years          BP:           161/64 mmHg Patient Gender:  F                 HR:           74 bpm. Exam Location:  [facility] Procedure: 2D Echo, Cardiac Doppler and Color Doppler Indications:    R06.00 (ICD-10-CM) - DOE (dyspnea on exertion)                 R60.0 (ICD-10-CM) - Pedal edema  History:        Patient has prior history of Echocardiogram examinations, most                 recent 09/03/2019. CHF, Pacemaker, Stroke, Mitral Valve Disease,                 Arrythmias:Atrial Flutter; Risk Factors:Hypertension, Diabetes                 and Dyslipidemia. Chronic anticoagulation,Sick sinus syndrome.  Sonographer:    Alvino Chapel RCS Referring Phys: [phone] SCOTT A Brigantine  1. Left ventricular ejection fraction, by estimation, is 60 to 65%. The left ventricle has normal function. The left ventricle has no regional wall motion abnormalities. There is moderate left ventricular hypertrophy. Left ventricular diastolic parameters are indeterminate.  2. Right ventricular systolic function is low normal. The right ventricular size is mildly enlarged. There is severely elevated pulmonary artery systolic pressure.  3. Left atrial size was severely dilated.  4. Right atrial size was severely dilated.  5. A small pericardial effusion is present. The pericardial effusion is circumferential.  6. The mitral valve is abnormal. Mild mitral valve regurgitation. Moderate mitral stenosis. Severe mitral annular calcification.  7. The tricuspid valve is abnormal. Tricuspid valve regurgitation is moderate.  8. The aortic valve is tricuspid. There is moderate calcification of the aortic valve. There is moderate thickening of  the aortic valve. Aortic valve regurgitation is not visualized. Mild to moderate aortic valve stenosis.  9. The inferior vena cava is normal in size with greater than 50% respiratory variability, suggesting right atrial pressure of 3 mmHg. FINDINGS  Left Ventricle: Left ventricular ejection fraction, by estimation, is 60 to 65%. The left ventricle has normal function. The left ventricle has no regional wall motion abnormalities. The left ventricular internal cavity size was normal in size. There is  moderate left ventricular hypertrophy. Left ventricular diastolic parameters are indeterminate. Right Ventricle: The right ventricular size is mildly enlarged. Right vetricular wall thickness was not assessed. Right ventricular systolic function is low normal. There is severely elevated pulmonary artery systolic pressure. The tricuspid regurgitant velocity is 3.89 m/s, and with an assumed right atrial pressure of 8 mmHg, the estimated right ventricular systolic pressure is 0000000 mmHg. Left Atrium: Left atrial size was severely dilated. Right Atrium: Right atrial size was severely dilated. Pericardium: A small pericardial effusion is present. The pericardial effusion is circumferential. Mitral Valve: The mitral valve is abnormal. There is moderate thickening of the mitral valve leaflet(s). There is moderate calcification of the mitral valve leaflet(s). Severe mitral annular calcification. Mild mitral valve regurgitation. Moderate mitral  valve stenosis. MV peak gradient, 30.2 mmHg. The mean mitral valve gradient is 6.0 mmHg. Tricuspid Valve: The tricuspid valve is abnormal. Tricuspid valve regurgitation is moderate . No evidence of tricuspid stenosis. Aortic Valve: The aortic valve is tricuspid. There is moderate calcification of the aortic valve. There is moderate thickening of the aortic valve. There is moderate aortic valve annular calcification. Aortic valve regurgitation is not visualized. Mild to moderate  aortic  stenosis is present. Aortic valve mean gradient measures 12.0 mmHg. Aortic valve peak gradient measures 19.7 mmHg. Aortic valve area, by VTI measures 1.19 cm. Pulmonic Valve: The pulmonic valve was not well visualized. Pulmonic valve regurgitation is mild. No evidence of pulmonic stenosis. Aorta: The aortic root is normal in size and structure. Pulmonary Artery: Severe pulmonary HTN, PASP is 68 mmHg. Venous: The inferior vena cava is normal in size with greater than 50% respiratory variability, suggesting right atrial pressure of 3 mmHg. IAS/Shunts: No atrial level shunt detected by color flow Doppler. Additional Comments: A device lead is visualized.  LEFT VENTRICLE PLAX 2D LVIDd:         3.80 cm LVIDs:         1.90 cm LV PW:         1.40 cm LV IVS:        1.30 cm LVOT diam:     1.80 cm LV SV:         66 LV SV Index:   37 LVOT Area:     2.54 cm  RIGHT VENTRICLE RV S prime:     8.05 cm/s TAPSE (M-mode): 1.5 cm LEFT ATRIUM              Index       RIGHT ATRIUM           Index LA diam:        5.10 cm  2.88 cm/m  RA Area:     31.30 cm LA Vol (A2C):   114.0 ml 64.31 ml/m RA Volume:   94.60 ml  53.37 ml/m LA Vol (A4C):   96.5 ml  54.44 ml/m LA Biplane Vol: 109.0 ml 61.49 ml/m  AORTIC VALVE AV Area (Vmax):    1.23 cm AV Area (Vmean):   1.23 cm AV Area (VTI):     1.19 cm AV Vmax:           222.00 cm/s AV Vmean:          164.000 cm/s AV VTI:            0.554 m AV Peak Grad:      19.7 mmHg AV Mean Grad:      12.0 mmHg LVOT Vmax:         107.00 cm/s LVOT Vmean:        79.200 cm/s LVOT VTI:          0.258 m LVOT/AV VTI ratio: 0.47  AORTA Ao Root diam: 2.70 cm MITRAL VALVE                TRICUSPID VALVE MV Area (PHT): 2.05 cm     TR Peak grad:   60.5 mmHg MV Peak grad:  30.2 mmHg    TR Vmax:        389.00 cm/s MV Mean grad:  6.0 mmHg MV Vmax:       2.75 m/s     SHUNTS MV Vmean:      92.3 cm/s    Systemic VTI:  0.26 m MV Decel Time: 370 msec     Systemic Diam: 1.80 cm MV E velocity: 268.00 cm/s Carlyle Dolly MD  Electronically signed by Carlyle Dolly MD Signature Date/Time: 11/18/2020/4:09:15 PM    Final     Microbiology: Recent Results (from the past 240 hour(s))  Resp Panel by RT-PCR (Flu A&B, Covid) Nasopharyngeal Swab     Status: None   Collection Time: 11/22/20  2:25 AM   Specimen:  Nasopharyngeal Swab; Nasopharyngeal(NP) swabs in vial transport medium  Result Value Ref Range Status   SARS Coronavirus 2 by RT PCR NEGATIVE NEGATIVE Final    Comment: (NOTE) SARS-CoV-2 target nucleic acids are NOT DETECTED.  The SARS-CoV-2 RNA is generally detectable in upper respiratory specimens during the acute phase of infection. The lowest concentration of SARS-CoV-2 viral copies this assay can detect is 138 copies/mL. A negative result does not preclude SARS-Cov-2 infection and should not be used as the sole basis for treatment or other patient management decisions. A negative result may occur with  improper specimen collection/handling, submission of specimen other than nasopharyngeal swab, presence of viral mutation(s) within the areas targeted by this assay, and inadequate number of viral copies(<138 copies/mL). A negative result must be combined with clinical observations, patient history, and epidemiological information. The expected result is Negative.  Fact Sheet for Patients:  EntrepreneurPulse.com.au  Fact Sheet for Healthcare Providers:  IncredibleEmployment.be  This test is no t yet approved or cleared by the [place] FDA and  has been authorized for detection and/or diagnosis of SARS-CoV-2 by FDA under an Emergency Use Authorization (EUA). This EUA will remain  in effect (meaning this test can be used) for the duration of the COVID-19 declaration under Section 564(b)(1) of the Act, 21 U.S.C.section 360bbb-3(b)(1), unless the authorization is terminated  or revoked sooner.       Influenza A by PCR NEGATIVE NEGATIVE Final   Influenza B by PCR  NEGATIVE NEGATIVE Final    Comment: (NOTE) The Xpert Xpress SARS-CoV-2/FLU/RSV plus assay is intended as an aid in the diagnosis of influenza from Nasopharyngeal swab specimens and should not be used as a sole basis for treatment. Nasal washings and aspirates are unacceptable for Xpert Xpress SARS-CoV-2/FLU/RSV testing.  Fact Sheet for Patients: EntrepreneurPulse.com.au  Fact Sheet for Healthcare Providers: IncredibleEmployment.be  This test is not yet approved or cleared by the [place] FDA and has been authorized for detection and/or diagnosis of SARS-CoV-2 by FDA under an Emergency Use Authorization (EUA). This EUA will remain in effect (meaning this test can be used) for the duration of the COVID-19 declaration under Section 564(b)(1) of the Act, 21 U.S.C. section 360bbb-3(b)(1), unless the authorization is terminated or revoked.  Performed at [facility], [address], [place], [place] [address]      Labs: Basic Metabolic Panel: Recent Labs  Lab 11/22/20 0000 11/22/20 0316 11/23/20 0634 11/24/20 0621 11/25/20 0455  NA 134*  --  135 135 134*  K 4.1  --  4.1 3.7 3.8  CL 96*  --  94* 95* 94*  CO2 26  --  '30 30 30  '$ GLUCOSE 98  --  101* 121* 97  BUN 40*  --  45* 43* 49*  CREATININE 2.12*  --  1.92* 1.86* 2.22*  CALCIUM 10.6*  --  10.1 10.1 10.1  MG  --  2.2 2.2 2.2 2.3  PHOS  --  2.7  --   --   --    Liver Function Tests: Recent Labs  Lab 11/22/20 0000 11/23/20 0634  AST 24 23  ALT 17 15  ALKPHOS 61 57  BILITOT 0.7 1.1  PROT 8.1 7.3  ALBUMIN 4.7 4.2   Recent Labs  Lab 11/22/20 0000  LIPASE 57*   No results for input(s): AMMONIA in the last 168 hours. CBC: Recent Labs  Lab 11/22/20 0000 11/23/20 0634 11/24/20 0621 11/25/20 0455  WBC 8.3 9.1 8.4 5.7  NEUTROABS 6.0  --   --   --  HGB 11.9* 11.3* 11.1* 10.9*  HCT 38.8 36.5 35.3* 35.1*  MCV 90.4 90.8 90.5 91.4  PLT 196 182 165 164   Cardiac  Enzymes: No results for input(s): CKTOTAL, CKMB, CKMBINDEX, TROPONINI in the last 168 hours. BNP: Invalid input(s): POCBNP CBG: No results for input(s): GLUCAP in the last 168 hours.  Time coordinating discharge:  36 minutes  Signed:  Orson Eva, DO [facility] Pager: [phone] 11/25/2020, 6:32 PM

## 2020-11-26 ENCOUNTER — Telehealth: Payer: Self-pay | Admitting: Family Medicine

## 2020-11-26 DIAGNOSIS — I5020 Unspecified systolic (congestive) heart failure: Secondary | ICD-10-CM | POA: Diagnosis not present

## 2020-11-26 NOTE — Telephone Encounter (Signed)
Please go ahead with prescription for wheelchair, due to weakness and inability to walk

## 2020-11-26 NOTE — Telephone Encounter (Signed)
Transition Care Management Unsuccessful Follow-up Telephone Call  Date of discharge and from where:  11/25/2020 from [facility]  Attempts:  1st Attempt  Reason for unsuccessful TCM follow-up call:  Left voice message

## 2020-11-26 NOTE — Telephone Encounter (Signed)
Patient is requesting a prescription for wheel chair not able to get around good get getting of hospital. [facility]

## 2020-11-26 NOTE — Telephone Encounter (Signed)
Prescription faxed to [facility]. Left message to return call

## 2020-11-27 ENCOUNTER — Other Ambulatory Visit: Payer: Self-pay | Admitting: Family Medicine

## 2020-11-27 ENCOUNTER — Telehealth: Payer: Self-pay | Admitting: Gastroenterology

## 2020-11-27 NOTE — Telephone Encounter (Signed)
Daughter [name](DPR) notified and verbalized understanding.

## 2020-11-27 NOTE — Telephone Encounter (Signed)
Patient needs hospital follow-up in about 4 weeks. Please arrange. DX: constipation, anemia, heme positive stool, anorexia.   Dena: Please arrange CBC in 1 week. Dx: Anemia.

## 2020-11-28 ENCOUNTER — Encounter: Payer: Self-pay | Admitting: Gastroenterology

## 2020-11-29 DIAGNOSIS — I272 Pulmonary hypertension, unspecified: Secondary | ICD-10-CM | POA: Diagnosis not present

## 2020-11-29 DIAGNOSIS — N184 Chronic kidney disease, stage 4 (severe): Secondary | ICD-10-CM | POA: Diagnosis not present

## 2020-11-29 DIAGNOSIS — M1030 Gout due to renal impairment, unspecified site: Secondary | ICD-10-CM | POA: Diagnosis not present

## 2020-11-29 DIAGNOSIS — I130 Hypertensive heart and chronic kidney disease with heart failure and stage 1 through stage 4 chronic kidney disease, or unspecified chronic kidney disease: Secondary | ICD-10-CM | POA: Diagnosis not present

## 2020-11-29 DIAGNOSIS — E1122 Type 2 diabetes mellitus with diabetic chronic kidney disease: Secondary | ICD-10-CM | POA: Diagnosis not present

## 2020-11-29 DIAGNOSIS — D631 Anemia in chronic kidney disease: Secondary | ICD-10-CM | POA: Diagnosis not present

## 2020-11-29 DIAGNOSIS — E1151 Type 2 diabetes mellitus with diabetic peripheral angiopathy without gangrene: Secondary | ICD-10-CM | POA: Diagnosis not present

## 2020-11-29 DIAGNOSIS — I872 Venous insufficiency (chronic) (peripheral): Secondary | ICD-10-CM | POA: Diagnosis not present

## 2020-11-29 DIAGNOSIS — I5033 Acute on chronic diastolic (congestive) heart failure: Secondary | ICD-10-CM | POA: Diagnosis not present

## 2020-12-01 ENCOUNTER — Telehealth: Payer: Self-pay | Admitting: Family Medicine

## 2020-12-01 ENCOUNTER — Telehealth: Payer: Self-pay

## 2020-12-01 ENCOUNTER — Encounter: Payer: Medicare HMO | Admitting: Physician Assistant

## 2020-12-01 ENCOUNTER — Other Ambulatory Visit: Payer: Self-pay

## 2020-12-01 DIAGNOSIS — D649 Anemia, unspecified: Secondary | ICD-10-CM

## 2020-12-01 NOTE — Telephone Encounter (Signed)
Please advise. Thank you

## 2020-12-01 NOTE — Telephone Encounter (Signed)
Contacted Debbie at [facility]. Jackelyn Poling states they are out of network and she did try to call patient but no answer.  Wheelchair note and script sent to [facility].

## 2020-12-01 NOTE — Telephone Encounter (Signed)
Received call from Craig Beach at [place] to notify provider patient's insurance [facility]) is out of network and wheelchair isn't covered. Patient needs an Oncologist. Jackelyn Poling will notify patient. Please advise Debbie at [phone] if additional information needed.

## 2020-12-01 NOTE — Telephone Encounter (Signed)
Noted   Labs put in the mail to the pt with instructions to do in one week

## 2020-12-01 NOTE — Telephone Encounter (Signed)
Received call from [place] at [facility] to notify office patient's insurance [facility]

## 2020-12-01 NOTE — Telephone Encounter (Signed)
Spoke with [name].  States pt is weak from being in hospital and not able to get out for INR appt.  After reviewing D/C summary pt was taken off warfarin pending EGD for GI Bleed.  Pt sign out of hospital AMA and opted to schedule procedure as an out pt.  Informed [name] pt does not need to come for INR appt until she is started back on warfarin.  Informed they need to contact GI doctor to schedule EGD.  [name] will discuss with her mother.

## 2020-12-01 NOTE — Telephone Encounter (Signed)
patient coughing a lot which is causing her to throw up. Its clear what is coughing up .She is still weak but eating but not that much. Not able to feed herself

## 2020-12-01 NOTE — Telephone Encounter (Signed)
Daughter [name] called to say her mother needs an apt for INR but she says her mother is too weak to come to office. She asks if she can have some one come to her home.   I will forward to Coumadin Nurse, Edrick Oh, RN

## 2020-12-02 ENCOUNTER — Telehealth: Payer: Self-pay | Admitting: Family Medicine

## 2020-12-02 NOTE — Telephone Encounter (Signed)
Pt was referred to [facility] after hospital stay. Contacted [facility] and they saw the patient on Saturday and will be going back this week. Alvis Lemmings will draw labs at next visit and fax results over.   Pt daughter contacted and verbalized understanding

## 2020-12-02 NOTE — Telephone Encounter (Signed)
Robitussin-DM as needed  Would be fine to do a home health consult due to weakness, renal failure Please see if home health can do a CBC, CMP

## 2020-12-02 NOTE — Telephone Encounter (Signed)
Pt is still weak after being home from hospital. Pt daughter would like someone to come out to home to check vital and possibly coumadin.  Does have wound nurse coming out to check on wound. Also patient is coughing up phelgm that is making her nauseated and daughter is wanting to know if there are any OTC treatments they can try. Please advise. Thank you

## 2020-12-02 NOTE — Telephone Encounter (Signed)
Daughter [name] is wanting someone to come out to check patient's vitals. She very weak and daughter wants her checked out please. If home health could come in.

## 2020-12-03 NOTE — Telephone Encounter (Signed)
I thought this was already addressed in a previous message May need to be seen either here or ER for evaluation if she is getting that weak

## 2020-12-03 NOTE — Telephone Encounter (Signed)
SEE 6/28 Message:   Pt was referred to [facility] after hospital stay. Contacted [facility] and they saw the patient on Saturday and will be going back this week. Alvis Lemmings will draw labs at next visit and fax results over.   Pt daughter contacted and verbalized understanding      Luking, Elayne Snare, MD to [facility]      12:40 PM Note Robitussin-DM as needed   Would be fine to do a home health consult due to weakness, renal failure Please see if home health can do a CBC, CMP

## 2020-12-05 ENCOUNTER — Telehealth: Payer: Self-pay | Admitting: Pharmacist

## 2020-12-05 ENCOUNTER — Telehealth: Payer: Self-pay | Admitting: Family Medicine

## 2020-12-05 DIAGNOSIS — I130 Hypertensive heart and chronic kidney disease with heart failure and stage 1 through stage 4 chronic kidney disease, or unspecified chronic kidney disease: Secondary | ICD-10-CM | POA: Diagnosis not present

## 2020-12-05 DIAGNOSIS — R270 Ataxia, unspecified: Secondary | ICD-10-CM | POA: Diagnosis not present

## 2020-12-05 DIAGNOSIS — M1030 Gout due to renal impairment, unspecified site: Secondary | ICD-10-CM | POA: Diagnosis not present

## 2020-12-05 DIAGNOSIS — E1122 Type 2 diabetes mellitus with diabetic chronic kidney disease: Secondary | ICD-10-CM | POA: Diagnosis not present

## 2020-12-05 DIAGNOSIS — I5033 Acute on chronic diastolic (congestive) heart failure: Secondary | ICD-10-CM | POA: Diagnosis not present

## 2020-12-05 DIAGNOSIS — N184 Chronic kidney disease, stage 4 (severe): Secondary | ICD-10-CM | POA: Diagnosis not present

## 2020-12-05 DIAGNOSIS — R531 Weakness: Secondary | ICD-10-CM | POA: Diagnosis not present

## 2020-12-05 DIAGNOSIS — I872 Venous insufficiency (chronic) (peripheral): Secondary | ICD-10-CM | POA: Diagnosis not present

## 2020-12-05 DIAGNOSIS — E1151 Type 2 diabetes mellitus with diabetic peripheral angiopathy without gangrene: Secondary | ICD-10-CM | POA: Diagnosis not present

## 2020-12-05 DIAGNOSIS — I272 Pulmonary hypertension, unspecified: Secondary | ICD-10-CM | POA: Diagnosis not present

## 2020-12-05 DIAGNOSIS — D631 Anemia in chronic kidney disease: Secondary | ICD-10-CM | POA: Diagnosis not present

## 2020-12-05 NOTE — Telephone Encounter (Signed)
Larene Beach from [facility] called to see if we wanted an INR drawn on pt. Advised she is due for an INR, yes please drawn. It will be a lab draw. She will have faxed to [facility] today since Lattie Haw is out of the office today.

## 2020-12-09 ENCOUNTER — Telehealth: Payer: Self-pay | Admitting: Family Medicine

## 2020-12-09 ENCOUNTER — Telehealth: Payer: Self-pay | Admitting: Internal Medicine

## 2020-12-09 NOTE — Telephone Encounter (Signed)
Lmom for pt to call back. 

## 2020-12-09 NOTE — Telephone Encounter (Signed)
[name] called back and states after talking with phyllis they dont want hospice right now but would like a hospital bed and a nurse to come out daily.

## 2020-12-09 NOTE — Telephone Encounter (Signed)
Pt's daughter [name] called and states They already have a nurse coming out to check wound on leg and states she has one coming out to check vitals. States she is not sure how much longer they will come out. Came last Friday. Would like to get a sitter. Not able to move and walk like she should. Does not want to get up and does not want to have anybody to touch her. States she is getting a sore on buttock. Not able to bring her out because she is so weak. Not eating much. Giving boost. Did not eat anything Saturday. Ate some freeze pops yesterday. Trying to keep water in her. Wanted to keep dr scott updated. And wanted to get someone to come out and sit with her and keep her turned. And make sure she is getting her meds and keep her changed. [name] and [name] was talking about hospice. Has not mentioned that to [place]. And has not talk to other siblings about hospice or pt's husband. States phyllis said it would be dr scott's call and wanted to know his thoughts.   phyllis number is [phone].

## 2020-12-09 NOTE — Telephone Encounter (Signed)
Management of Warfarin will need to come from her cardiologist.   Unfortunately, after much discussion in the hospital, she left [place] as she did not want to stay for an EGD despite reports of black stools.  Would still recommend EGD at some point to evaluate this, but we will need to see her in the office to get this arranged.  Additionally, per review of telephone note today to her primary care provider, looks like patient is quite deconditioned and again would need office visit to assess candidacy for sedation.  She is not scheduled to be seen in our office until November.  We will see if we can get her in any sooner. She should be on Protonix 40 mg twice daily.  She was due for repeat CBC 1 week after hospital discharge which is currently overdue.  She should have this completed.  [place] Nurse, please let patient/daughter know the above. Please confirm she is taking Protonix BID and remind them about having CBC completed. Is she still having black stool?

## 2020-12-09 NOTE — Telephone Encounter (Signed)
BNew Message:    Daughter called and wanted to know when could pt go back on her Warfarin?

## 2020-12-09 NOTE — Telephone Encounter (Signed)
Pt's daughter would like to personally talk to Dr. Wolfgang Phoenix. Due to mothers health,  Pt's daughter is wondering how to go about getting home health at her house  Pt's daughter is stating that it's very important and urgent.

## 2020-12-09 NOTE — Telephone Encounter (Signed)
This decision needs to be made by GI or Dr. Lovena Le. Patient left hospital AMA. She was supposed to have colonoscopy/EGD inpatient.

## 2020-12-09 NOTE — Telephone Encounter (Signed)
Patient's daughter call back to see if there is an update

## 2020-12-10 ENCOUNTER — Telehealth: Payer: Self-pay | Admitting: Internal Medicine

## 2020-12-10 ENCOUNTER — Encounter: Payer: Self-pay | Admitting: Family Medicine

## 2020-12-10 NOTE — Telephone Encounter (Signed)
Spoke to [name].  She was agreeable to keeping pt's appointment for November for now.  She said we would just have to wait and see if pt is able to make it out then.  She was informed that pt needs labs in 4 weeks.  She said that she would really like for Dr. Wolfgang Phoenix to manage all of pt's labs.  Informed her that I would let Aliene Altes, PA-C know.

## 2020-12-10 NOTE — Telephone Encounter (Signed)
Daughter [name] called in and informed me that she was returning my call and that she is the person that handles all of pt's affairs.  She was informed of information advised by Aliene Altes, PA-C.  [name] stated that she has told us that pt is not interested in having an EGD and is too weak to have anything done.  She requested me to cancel November appointment and stated that she would have to get back with [place].  She said that she is not sure if pt is still with black stools or not.  She was made aware that pt should be taking Protonix BID.  She said she is unsure if she is doing this or not.  She would have to check with her dad.  She said she recently had labs drawn on Friday at home by [facility] since Dr. Wolfgang Phoenix and her cardiologist wanted some done.  She said pt can not come out because she is too weak.  She was upset about pt being off warfarin.  Informed her that pt needs to consult cardiologist about it.  She said that they have and are awaiting to hear back.

## 2020-12-10 NOTE — Telephone Encounter (Signed)
Noted  

## 2020-12-10 NOTE — Telephone Encounter (Addendum)
Spoke with pt's daughter, DPR who is asking if pt can restart Warfarin.  Pt's daughter advised Dr Caryl Comes attempted to contact her yesterday 12/09/2020 to discuss restarting Warfarin.  Pt's daughter reports pt is having pain in her legs and they do not want her to have another stroke or a blood clot in her leg.  Advised if pt is having pain and swelling in her leg(s) pt should go to ED for further evaluation as pt left hospital AMA.  Pt's daughter states pt cannot walk because she is too weak. So they are unable to go to the ED.  Pt's daughter advised if pt is this weak and has swelling and pain in her leg she should call EMS to be transported to ED.  Pt's daughter verbalizes understanding and agrees with current plan.

## 2020-12-10 NOTE — Telephone Encounter (Signed)
Lmom for pt or daughter to call me back.

## 2020-12-10 NOTE — Telephone Encounter (Signed)
Noted. Thank you Melissa for the update. If you can fax results to our office at [phone], that would be great!   [facility] Nurse: Patient had CBC updated with [facility]. Hemoglobin is stable/improved to 11.8. This is up from 10.9 at hospital discharge. We will need to keep a close eye on this until her next visit. Lets repeat CBC in 4 weeks. Dx: Anemia. Please arrange.   Stacey: Patient is currently scheduled for hospital follow-up in November. Can we try to get her moved up with any APP or Dr. Abbey Chatters in the next 4-6 weeks?

## 2020-12-10 NOTE — Telephone Encounter (Signed)
INR came back at 1.2. patient has not been taking her warfarin since hospital admission (I was unaware of this when order was given to check INR) CBC was also drawn Hgb 11.8 HCT 36.5 Plt 191  GI and Dr. Caryl Comes are in discussion about resuming warfarin. I will forward these results to them.

## 2020-12-10 NOTE — Telephone Encounter (Signed)
*  STAT* If patient is at the pharmacy, call can be transferred to refill team.   1. Which medications need to be refilled? (please list name of each medication and dose if known) warfarin (COUMADIN) 5 MG tablet  2. Which pharmacy/location (including street and city if local pharmacy) is medication to be sent to? [facility]  3. Do they need a 30 day or 90 day supply? [place]

## 2020-12-10 NOTE — Telephone Encounter (Signed)
Lab results faxed to [phone]

## 2020-12-10 NOTE — Telephone Encounter (Signed)
Noted. Will defer to Dr. Wolfgang Phoenix for monitoring of labs. Nothing further needed at this time.

## 2020-12-10 NOTE — Telephone Encounter (Signed)
Noted. If she doesn't want to follow-up with [place], that is her decision. I would offer to just leave the OV in place, and she can call to cancel closer to time as our appointments are filling up quickly. Would hate for her to want to see [place], but not have any availabilities to get her in.   Please see separate telephone note from 7/1. I did receive blood work results from [employer]. I recommended repeat CBC in 4 weeks to keep a close eye on this. Again, her decision if she wants to continue to have blood work done with our office or if she prefers to continue with Dr. Luking/cardiology.

## 2020-12-10 NOTE — Telephone Encounter (Signed)
[name] is returning Angela's call. She is requesting a callback on her cell phone listed on this telephone note as her contact. She is available this morning until 9:30-9:45 AM. Please advise.

## 2020-12-11 ENCOUNTER — Encounter: Payer: Self-pay | Admitting: Family Medicine

## 2020-12-11 ENCOUNTER — Other Ambulatory Visit: Payer: Self-pay

## 2020-12-11 ENCOUNTER — Other Ambulatory Visit: Payer: Self-pay | Admitting: *Deleted

## 2020-12-11 DIAGNOSIS — D631 Anemia in chronic kidney disease: Secondary | ICD-10-CM | POA: Diagnosis not present

## 2020-12-11 DIAGNOSIS — R531 Weakness: Secondary | ICD-10-CM

## 2020-12-11 DIAGNOSIS — I872 Venous insufficiency (chronic) (peripheral): Secondary | ICD-10-CM | POA: Diagnosis not present

## 2020-12-11 DIAGNOSIS — I272 Pulmonary hypertension, unspecified: Secondary | ICD-10-CM | POA: Diagnosis not present

## 2020-12-11 DIAGNOSIS — M1030 Gout due to renal impairment, unspecified site: Secondary | ICD-10-CM | POA: Diagnosis not present

## 2020-12-11 DIAGNOSIS — L97812 Non-pressure chronic ulcer of other part of right lower leg with fat layer exposed: Secondary | ICD-10-CM | POA: Diagnosis not present

## 2020-12-11 DIAGNOSIS — E1122 Type 2 diabetes mellitus with diabetic chronic kidney disease: Secondary | ICD-10-CM | POA: Diagnosis not present

## 2020-12-11 DIAGNOSIS — E1151 Type 2 diabetes mellitus with diabetic peripheral angiopathy without gangrene: Secondary | ICD-10-CM | POA: Diagnosis not present

## 2020-12-11 DIAGNOSIS — I130 Hypertensive heart and chronic kidney disease with heart failure and stage 1 through stage 4 chronic kidney disease, or unspecified chronic kidney disease: Secondary | ICD-10-CM | POA: Diagnosis not present

## 2020-12-11 DIAGNOSIS — I5033 Acute on chronic diastolic (congestive) heart failure: Secondary | ICD-10-CM | POA: Diagnosis not present

## 2020-12-11 DIAGNOSIS — N184 Chronic kidney disease, stage 4 (severe): Secondary | ICD-10-CM | POA: Diagnosis not present

## 2020-12-11 NOTE — Telephone Encounter (Signed)
This patient is homebound Please put her on the schedule for tomorrow at noon for a home visit-family is aware  Please also make sure that home health is consulted and have home health as well as Send social service worker consult to see family it is apparent that they need additional help and will need regular nurse visits. I would like to see a metabolic 7, liver, CBC drawn within the coming week if possible  Finally may have orders for hospital bed  Not sure if home health can send out a nurse every day but they may be able to send a aide out periodically who could help-once again the consults above should help address that

## 2020-12-11 NOTE — Telephone Encounter (Signed)
Unfortunately this is becoming a sticky wicket  I have a good working relationship with the family.  I will do a home visit tomorrow at lunchtime.  I would advise currently against starting Coumadin site unseen

## 2020-12-11 NOTE — Telephone Encounter (Signed)
Called and spoke with [name]. She is aware dr Nicki Reaper is doing a home visit tomorrow at noon. I asked where to send rx for hospital bed to and [name] said cancel that request that pt told her she did not want a hospital bed. [name] gave me the home health nurses phone number Sharlet Salina at [place] [phone] and I called and left her a message about doing the bloodwork for met 7, liver, and cbc in the upcoming week. Pt does have home health nurse coming out and I put in referral to social worker.

## 2020-12-11 NOTE — Telephone Encounter (Signed)
Recommendation from Dr Wolfgang Phoenix is to hold off on restarting warfarin at this time.  He plans to make a home visit to evaluate patient tomorrow.   Warfarin refill denied until pt is evaluated by MD.

## 2020-12-11 NOTE — Telephone Encounter (Signed)
Larene Beach called back and states she is going today and will draw cbc, met 7 and liver profile today.

## 2020-12-11 NOTE — Telephone Encounter (Signed)
No documentation that pt has been cleared to restart warfarin in chart.  Message sent to Dr Caryl Comes and Dr Wolfgang Phoenix to please advise.

## 2020-12-11 NOTE — Telephone Encounter (Signed)
Thank you.  Will hold off on starting warfarin until further notice.  Refill denied.

## 2020-12-11 NOTE — Telephone Encounter (Signed)
Patient did not go to ED as advised in last note.  Daughter has requested a refill on pt's warfarin.  So far, pt has not been cleared to restart warfarin.  Any plans on restarting warfarin?

## 2020-12-12 ENCOUNTER — Other Ambulatory Visit: Payer: Self-pay

## 2020-12-12 ENCOUNTER — Ambulatory Visit: Payer: Medicare HMO | Admitting: Family Medicine

## 2020-12-12 DIAGNOSIS — Z515 Encounter for palliative care: Secondary | ICD-10-CM | POA: Diagnosis not present

## 2020-12-12 DIAGNOSIS — I10 Essential (primary) hypertension: Secondary | ICD-10-CM

## 2020-12-12 DIAGNOSIS — N184 Chronic kidney disease, stage 4 (severe): Secondary | ICD-10-CM | POA: Diagnosis not present

## 2020-12-12 NOTE — Progress Notes (Signed)
   Subjective:    Patient ID: [name], female    DOB: [birth year], 87 y.o.   MRN: [id number]  HPI Provider will be doing home visit with patient. Pt daughter [name] is aware.  I did do a home visit.  Patient is pretty much bedbound currently on a sofa.  Has swelling in the legs and nurse does come out.  She is not eating much or drinking much.  Review of Systems     Objective:   Physical Exam Heart rate controlled lungs are clear extremities the right 1 is wrapped the left 1 has some edema in the ankle Patient appears weak and frail.  It is unlikely that she will live long in her given state.       Assessment & Plan:  1. Primary hypertension Blood pressure decent control continue current measures blood pressure today 130/68  2. Atrial fibrillation with RVR (HCC) History of atrial fibrillation currently right at the moment heart rate is controlled it is not a good idea to put her back on Coumadin because of her poor p.o. intake and increased risk of GI bleeds  3. Chronic kidney disease, stage IV (severe) (HCC) Await labs from home health  After long discussion with patient and family they have chosen palliative care which I believe is a wise choice we will initiate palliative care consult

## 2020-12-14 NOTE — Telephone Encounter (Signed)
FYI-recommend against starting Coumadin. I did a home visit. Unlikely patient will live more than the next couple months Consultation with palliative care/hospice care will be occurring soon

## 2020-12-14 NOTE — Telephone Encounter (Signed)
Nurses-FYI-home visit was completed thank you

## 2020-12-15 ENCOUNTER — Ambulatory Visit: Payer: Self-pay | Admitting: *Deleted

## 2020-12-15 NOTE — Telephone Encounter (Signed)
Nurses-please connect with patient's home health and see if they can fax [place] the results of blood work that was done last week thank you

## 2020-12-15 NOTE — Addendum Note (Signed)
Addended by: Malen Gauze on: 12/15/2020 07:07 AM   Modules accepted: Orders

## 2020-12-16 ENCOUNTER — Ambulatory Visit (INDEPENDENT_AMBULATORY_CARE_PROVIDER_SITE_OTHER): Payer: Medicare HMO

## 2020-12-16 ENCOUNTER — Encounter: Payer: Self-pay | Admitting: Family Medicine

## 2020-12-16 ENCOUNTER — Telehealth: Payer: Self-pay | Admitting: *Deleted

## 2020-12-16 DIAGNOSIS — I495 Sick sinus syndrome: Secondary | ICD-10-CM

## 2020-12-16 NOTE — Telephone Encounter (Signed)
Nurses When I look at the report this CBC is resulted But not met 7 and liver profile do not appear to be resulted  I wonder if this is a preliminary result and there is a final result somewhere else? Please reconnect with home health regarding this If for some reason they do not have additional information This will need to be repeated  If this lab work is completed and I miss reading it please let me know

## 2020-12-16 NOTE — Chronic Care Management (AMB) (Signed)
  Chronic Care Management   Note  12/16/2020 Name: LYNA LANINGHAM MRN: [id number] DOB: 12-29-1926  SHANEIL YAZDI is a 87 y.o. year old female who is a primary care patient of Luking, Elayne Snare, MD. I reached out to [name] by phone today in response to a referral sent by Ms. Denese Killings Mor[place]'s PCP, Dr. Wolfgang Phoenix.      Ms. Cohoon was given information about Chronic Care Management services today including:  CCM service includes personalized support from designated clinical staff supervised by her physician, including individualized plan of care and coordination with other care providers 24/7 contact phone numbers for assistance for urgent and routine care needs. Service will only be billed when office clinical staff spend 20 minutes or more in a month to coordinate care. Only one practitioner may furnish and bill the service in a calendar month. The patient may stop CCM services at any time (effective at the end of the month) by phone call to the office staff. The patient will be responsible for cost sharing (co-pay) of up to 20% of the service fee (after annual deductible is met).  Confirmed by daughter [name] DPR on file verbally agreed to assistance and services provided by embedded care coordination/care management team today.  Follow up plan: Telephone appointment with care management team member scheduled for:12/24/2020  [place] [facility]

## 2020-12-17 ENCOUNTER — Telehealth (INDEPENDENT_AMBULATORY_CARE_PROVIDER_SITE_OTHER): Payer: Medicare HMO | Admitting: Family Medicine

## 2020-12-17 ENCOUNTER — Other Ambulatory Visit: Payer: Self-pay

## 2020-12-17 DIAGNOSIS — N184 Chronic kidney disease, stage 4 (severe): Secondary | ICD-10-CM | POA: Diagnosis not present

## 2020-12-17 DIAGNOSIS — D631 Anemia in chronic kidney disease: Secondary | ICD-10-CM | POA: Diagnosis not present

## 2020-12-17 DIAGNOSIS — I5033 Acute on chronic diastolic (congestive) heart failure: Secondary | ICD-10-CM | POA: Diagnosis not present

## 2020-12-17 DIAGNOSIS — I872 Venous insufficiency (chronic) (peripheral): Secondary | ICD-10-CM | POA: Diagnosis not present

## 2020-12-17 DIAGNOSIS — M1030 Gout due to renal impairment, unspecified site: Secondary | ICD-10-CM | POA: Diagnosis not present

## 2020-12-17 DIAGNOSIS — I272 Pulmonary hypertension, unspecified: Secondary | ICD-10-CM | POA: Diagnosis not present

## 2020-12-17 DIAGNOSIS — I130 Hypertensive heart and chronic kidney disease with heart failure and stage 1 through stage 4 chronic kidney disease, or unspecified chronic kidney disease: Secondary | ICD-10-CM | POA: Diagnosis not present

## 2020-12-17 DIAGNOSIS — E1151 Type 2 diabetes mellitus with diabetic peripheral angiopathy without gangrene: Secondary | ICD-10-CM | POA: Diagnosis not present

## 2020-12-17 DIAGNOSIS — E1122 Type 2 diabetes mellitus with diabetic chronic kidney disease: Secondary | ICD-10-CM | POA: Diagnosis not present

## 2020-12-17 LAB — CUP PACEART REMOTE DEVICE CHECK
Battery Remaining Longevity: 125 mo
Battery Remaining Percentage: 95.5 %
Battery Voltage: 3.04 V
Brady Statistic RV Percent Paced: 22 %
Date Time Interrogation Session: 20220713152356
Implantable Lead Implant Date: 20100602
Implantable Lead Implant Date: 20100602
Implantable Lead Location: 753859
Implantable Lead Location: 753860
Implantable Pulse Generator Implant Date: 20211011
Lead Channel Impedance Value: 410 Ohm
Lead Channel Pacing Threshold Amplitude: 1 V
Lead Channel Pacing Threshold Pulse Width: 0.4 ms
Lead Channel Sensing Intrinsic Amplitude: 12 mV
Lead Channel Setting Pacing Amplitude: 2.5 V
Lead Channel Setting Pacing Pulse Width: 0.4 ms
Lead Channel Setting Sensing Sensitivity: 2 mV
Pulse Gen Model: 2272
Pulse Gen Serial Number: 6220844

## 2020-12-17 NOTE — Progress Notes (Signed)
12/17/20-left voicemail on [facility] Nurse) to return call

## 2020-12-17 NOTE — Progress Notes (Signed)
   Subjective:    Patient ID: [name], female    DOB: 01-17-1927, 87 y.o.   MRN: [id number] Telephone call video not available HPI daughter [name] states pt is having a good day today and has no concerns.  Patient is pretty much staying either on the couch or in a chair not walking any does not want physical therapy Virtual Visit via Telephone Note  I connected with [name] on 12/17/20 at 10:10 AM EDT by telephone and verified that I am speaking with the correct person using two identifiers.  Location: Patient: home Provider: office   I discussed the limitations, risks, security and privacy concerns of performing an evaluation and management service by telephone and the availability of in person appointments. I also discussed with the patient that there may be a patient responsible charge related to this service. The patient expressed understanding and agreed to proceed.   History of Present Illness:    Observations/Objective:   Assessment and Plan:   Follow Up Instructions:    I discussed the assessment and treatment plan with the patient. The patient was provided an opportunity to ask questions and all were answered. The patient agreed with the plan and demonstrated an understanding of the instructions.   The patient was advised to call back or seek an in-person evaluation if the symptoms worsen or if the condition fails to improve as anticipated.  I provided 15 minutes of non-face-to-face time during this encounter.         Review of Systems     Objective:   Physical Exam  Today's visit was via telephone Physical exam was not possible for this visit       Assessment & Plan:  Renal insufficiency Chronic kidney disease Progression toward end of life Currently right now family undecided regarding palliative care We will see if the home health agency has palliative care capabilities or if it is just regular home health with possibility of  hospice Will go back out to see patient again in a few weeks time to discuss further Currently right now they would like to do everything possible to treat at home Reduce diuretic to just 2 tablets each morning because of elevated BUN and creatinine Repeat metabolic 7

## 2020-12-17 NOTE — Telephone Encounter (Signed)
Blood work on Network engineer- home health nurse visiting patient today and will draw a Met 7 at doctor's request

## 2020-12-17 NOTE — Telephone Encounter (Signed)
Med list updated in EPIC

## 2020-12-17 NOTE — Telephone Encounter (Signed)
Per Dr. Nicki Reaper. He really needs these results. Please try to get today

## 2020-12-17 NOTE — Telephone Encounter (Signed)
Creatinine 2.09 BUN 66.  Hemoglobin 10.3  I did instruct her daughter to reduce the diuretic torsemide 2 every morning, no longer 3 every morning  Please make this change in epic thank you

## 2020-12-18 NOTE — Progress Notes (Signed)
Home health nurse Larene Beach stated that they will have social worker come out to discuss palliative care services and give info on private sitters. They can only offer aide to come a few times a week for help with bathing and dressing. The social worker will cone and discuss other community services to help.

## 2020-12-20 ENCOUNTER — Telehealth: Payer: Self-pay | Admitting: Family Medicine

## 2020-12-20 NOTE — Telephone Encounter (Signed)
Creatinine on most recent blood draw at 1.88 which is stable.  Continue as best as possible eating and drinking and following medications.  Have home health repeat this again in 1 month.  Follow-up sooner if any problems patient is homebound  Please inform daughter-[name] Feel free to get update on how she is doing thank you

## 2020-12-22 NOTE — Telephone Encounter (Signed)
Left message to return call with Larene Beach at [facility] to have Met 7 repeated in one month   [name](DPR) advised per Dr Nicki Reaper: Creatinine on most recent blood draw at 1.88 which is stable.  Continue as best as possible eating and drinking and following medications. [name] verbalized understanding and stated her mother seemed to be doing better and has even sat up some and talking some.

## 2020-12-23 NOTE — Telephone Encounter (Signed)
Left message to return call with Larene Beach at [facility] to have Met 7 repeated in one month

## 2020-12-24 ENCOUNTER — Telehealth: Payer: Self-pay | Admitting: *Deleted

## 2020-12-24 ENCOUNTER — Ambulatory Visit (INDEPENDENT_AMBULATORY_CARE_PROVIDER_SITE_OTHER): Payer: Medicare HMO | Admitting: *Deleted

## 2020-12-24 DIAGNOSIS — I5033 Acute on chronic diastolic (congestive) heart failure: Secondary | ICD-10-CM | POA: Diagnosis not present

## 2020-12-24 DIAGNOSIS — I10 Essential (primary) hypertension: Secondary | ICD-10-CM

## 2020-12-24 NOTE — Chronic Care Management (AMB) (Signed)
Chronic Care Management   CCM RN Visit Note  12/24/2020 Name: [name] MRN: [id number] DOB: 07/27/1926  Subjective: [name] is a 87 y.o. year old female who is a primary care patient of Luking, Elayne Snare, MD. The care management team was consulted for assistance with disease management and care coordination needs.    Engaged with patient by telephone for initial visit in response to provider referral for case management and/or care coordination services.   Consent to Services:  The patient was given the following information about Chronic Care Management services today, agreed to services, and gave verbal consent: 1. CCM service includes personalized support from designated clinical staff supervised by the primary care provider, including individualized plan of care and coordination with other care providers 2. 24/7 contact phone numbers for assistance for urgent and routine care needs. 3. Service will only be billed when office clinical staff spend 20 minutes or more in a month to coordinate care. 4. Only one practitioner may furnish and bill the service in a calendar month. 5.The patient may stop CCM services at any time (effective at the end of the month) by phone call to the office staff. 6. The patient will be responsible for cost sharing (co-pay) of up to 20% of the service fee (after annual deductible is met). Patient agreed to services and consent obtained.  Patient agreed to services and verbal consent obtained.   Assessment: Review of patient past medical history, allergies, medications, health status, including review of consultants reports, laboratory and other test data, was performed as part of comprehensive evaluation and provision of chronic care management services.   SDOH (Social Determinants of Health) assessments and interventions performed:   Food Insecurity: No Food Insecurity   Worried About Charity fundraiser in the Last Year: Never true   Ran Out of  Food in the Last Year: Never true   Transportation Needs: No Transportation Needs   Lack of Transportation (Medical): No   Lack of Transportation (Non-Medical): No     CCM Care Plan  Allergies  Allergen Reactions   Tramadol     Drowsiness    Outpatient Encounter Medications as of 12/24/2020  Medication Sig Note   albuterol (VENTOLIN HFA) 108 (90 Base) MCG/ACT inhaler TAKE 2 PUFFS EVERY 6 HOURS AS NEEDED 11/22/2020: Dispense date: 10/23/2020   mupirocin ointment (BACTROBAN) 2 % Apply thin amount daily to skin blister 11/22/2020: Dispense date: 07/26/2020   pravastatin (PRAVACHOL) 80 MG tablet TAKE (1) TABLET BY MOUTH AT BEDTIME. 11/22/2020: Dispense date: 08/25/2020   torsemide (DEMADEX) 20 MG tablet TAKE 3 TABLETS BY MOUTH DAILY 12/17/2020: Take 2 tablets by mouth qam- decreased 12/17/20   Travoprost, BAK Free, (TRAVATAN) 0.004 % SOLN ophthalmic solution Place 1 drop into both eyes at bedtime.     Vitamin D, Ergocalciferol, (DRISDOL) 1.25 MG (50000 UNIT) CAPS capsule TAKE 1 CAPSULE BY MOUTH ONCE A WEEK.    diltiazem (TIAZAC) 360 MG 24 hr capsule TAKE ONE CAPSULE BY MOUTH ONCE DAILY. 11/22/2020: Dispense date: 09/29/2020   VITAMIN D, CHOLECALCIFEROL, PO Take 1 tablet by mouth every morning. (Patient not taking: Reported on 12/24/2020)    No facility-administered encounter medications on file as of 12/24/2020.    Patient Active Problem List   Diagnosis Date Noted   Palliative care patient 12/12/2020   GI bleed 11/22/2020   Nausea 11/22/2020   Elevated lipase 11/22/2020   Elevated troponin 11/22/2020   Elevated brain natriuretic peptide (BNP) level 11/22/2020  Acute on chronic diastolic CHF (congestive heart failure) ([place]) 11/22/2020   Fecal impaction (HCC)    Melena    Chronic kidney disease, stage IV (severe) ([place]) 10/21/2020   Blister 07/10/2020   Stage 3b chronic kidney disease ([place]) 05/05/2020   Aortic atherosclerosis ([place]) 05/05/2020   Pleural effusion due to CHF (congestive heart  failure) ([place]) 08/28/2019   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   Anorexia 10/23/2018   Thyroid nodule 10/04/2017   Right sided weakness    Acute ischemic stroke ([place]) 09/20/2017   Controlled type 2 diabetes mellitus with complication, without long-term current use of insulin ([place]) 07/08/2016   Prediabetes 12/25/2015   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   TIA (transient ischemic attack) 08/20/2013   Gout 11/14/2012   Fasting hyperglycemia 04/06/2012   Atrial fibrillation with RVR ([place]) 08/31/2011   Chronic anticoagulation 12/16/2010   PPM-[facility] 11/17/2010   DJD (degenerative joint disease)    Hypertension 02/21/2009   Hyperlipidemia 11/26/2008   Constipation 10/31/2008    Conditions to be addressed/monitored:CHF and HTN  Care Plan : Heart Failure (Adult)  Updates made by Kassie Mends, RN since 12/24/2020 12:00 AM     Problem: Symptom Exacerbation (Heart Failure)      Long-Range Goal: Symptom Exacerbation Prevented or Minimized   Start Date: 12/24/2020  Expected End Date: 06/26/2021  This Visit's Progress: On track  Priority: High  Note:   Current Barriers:  Knowledge deficit related to basic heart failure pathophysiology and self care management- pt is unable to weigh at present, pt does not have advanced directives and declined information today.  Daughter reports she thinks patient has medicaid but has never applied for any assistance in the home, has not been sure about hospice or palliative care but taking care of patient is going to be too much for patient's spouse, they are interested in any guidance/ resources. Financial strain- unable to afford to pay for in home care/ assistance Unable to self administer medications as prescribed- daughter provides oversight Unable to perform ADLs independently- patient is dependent for all ADL's ,  has DME, unable to walk at present, has home health services Case Manager Clinical Goal(s):  patient will verbalize  understanding of Heart Failure Action Plan and when to call doctor Interventions:  Collaboration with Kathyrn Drown, MD regarding development and update of comprehensive plan of care as evidenced by provider attestation and co-signature Inter-disciplinary care team collaboration (see longitudinal plan of care) Basic overview and discussion of pathophysiology of Heart Failure reviewed  Provided verbal education on low sodium diet Provided written education on low sodium diet via My Chart Reviewed Heart Failure Action Plan in depth and provided written copy via My Chart Assessed need for readable accurate scales in home- pt has scales but is unable to stand and weigh Reviewed role of diuretics in prevention of fluid overload and management of heart failure Reviewed with daughter that some local fire departments give free smoke detectors (daughter would like care guide to reach out for any needed resources) Placed order for LCSW services for level of care concerns, caregiver stress, to verify medicaid and how to apply if pt does not have  (family has agreed to see if patient qualifies for in home hospice and would like to pursue this option) Reviewed medications and importance of taking as prescribed Reviewed importance/ rationale of completing advanced directives Sent message to primary care provider that pt/ family is ready for in home hospice services Self-Care Activities: Calls provider office  for new concerns or questions Patient Goals: - develop a rescue plan- action plan sent via My Chart - follow rescue plan if symptoms flare-up - know when to call the doctor  - avoid salty foods such as chips, snacks, fast food - call doctor early on for change in health status, symptoms such as swelling, dry cough, decreased urination, shortness of breath - call 911 for unrelieved shortness of breath or chest pain - take fluid pill as prescribed - be expecting a call from social worker for issues  about assistance in the home - be expecting a call from care guide for any needed resources and obtaining smoke detectors - keep legs up while sitting - use salt in moderation - watch for swelling in feet, ankles and legs every day Follow Up Plan: Telephone follow up appointment with care management team member scheduled for:   01/14/2021    Problem: Activity Tolerance (Heart Failure)      Problem: Obstructive Sleep Apnea (Heart Failure)      Care Plan : Hypertension (Adult) with comorbities CKD stage 4/ HLD  Updates made by Kassie Mends, RN since 12/24/2020 12:00 AM     Problem: Hypertension with co-morbidities CKD stage 4/ HLD   Priority: Medium     Long-Range Goal: Hypertension Monitored   Start Date: 12/24/2020  Expected End Date: 06/26/2021  This Visit's Progress: On track  Priority: Medium  Note:   Objective:  Last practice recorded BP readings:  BP Readings from Last 3 Encounters:  11/25/20 (!) 156/82  11/18/20 134/86  10/21/20 134/74   Most recent eGFR/CrCl:  Lab Results  Component Value Date   EGFR 15 (L) 10/21/2020    No components found for: CRCL Current Barriers:  Knowledge Deficits related to basic understanding of hypertension pathophysiology and self care management- family reports they have granddaughter that has been assisting patient's spouse in the home and she will be leaving August 4 and going back to school and spouse does not have enough help in the home even with what assistance adult daughters provide, daughter [name] reports they have talked about palliative care, hospice, etc but have not made a decision, [name] reports patient has medicaid but they have not pursued obtaining any assistance in the home and would like LCSW assistance for options.  Patient remains in recliner most of the time, is unable to walk at present, wears adult diapers, has no cognitive impairment per daughter, upon assessment daughter reports there are no working smoke Careers adviser in  the home and she would like resource for this. Does not adhere to provider recommendations re: does not monitor blood pressure regularly, daughter is RN and checks at times Does not attend all scheduled provider appointments- unable to leave home to attend appointments, primary care provider completed home visit Unable to perform ADLs independently- pt is dependent on her spouse with whom she lives with, adult daughters and grandchildren assist as needed, provide oversight for medications, pt has DME in the home, has home health services through [facility] with RN providing wound care to [facility].   NOTE- daughter [name] called RN care manager back and states she, pt and family are ready to pursue hospice services. Case Manager Clinical Goal(s):  patient will verbalize understanding of plan for hypertension management patient will demonstrate improved adherence to prescribed treatment plan for hypertension as evidenced by taking all medications as prescribed, monitoring and recording blood pressure as directed, adhering to low sodium/DASH diet Interventions:  Collaboration with Wolfgang Phoenix, Elayne Snare,  MD regarding development and update of comprehensive plan of care as evidenced by provider attestation and co-signature Inter-disciplinary care team collaboration (see longitudinal plan of care) Evaluation of current treatment plan related to hypertension self management and patient's adherence to plan as established by provider. Provided education to patient re: stroke prevention, s/s of heart attack and stroke, DASH diet, complications of uncontrolled blood pressure Reviewed medications with patient and discussed importance of compliance Discussed plans with patient for ongoing care management follow up and provided patient with direct contact information for care management team Advised patient, providing education and rationale, to monitor blood pressure daily and record, calling PCP for findings outside established  parameters.  Reviewed scheduled/upcoming provider appointments including:   primary care provider has been completing home visits, pt unable to leave home at present RN care manager discussed palliative care, hospice, intermittent home health services, PCS services, etc.   Referral placed for LCSW for assistance with level of care issues, resources, caregiver stress Referral placed for care guide for resource for smoke detectors RN care manager sent in basket to primary care provider reporting daughter and family have decided they want to pursue in home hospice services RN care manager reviewed patient changing positions q 2 hours and standing with assistance to prevent skin breakdown Self-Care Activities: Calls provider office for new concerns, questions, or BP outside discussed parameters Checks BP and records as discussed Follows a low sodium diet/DASH diet Patient Goals: - check blood pressure 3 times per week - write blood pressure results in a log or diary  - continue to have patient's daughter check blood pressure - follow low sodium diet - look over low sodium diet information sent via My Chart - take medications as prescribed Follow Up Plan: Telephone follow up appointment with care management team member scheduled for: 01/14/2021     Plan:Telephone follow up appointment with care management team member scheduled for:  01/14/2021  [name] [facility], BSN RN Case Manager [place] [phone]

## 2020-12-24 NOTE — Chronic Care Management (AMB) (Signed)
  Chronic Care Management   Outreach Note  12/24/2020 Name: [name] MRN: [id number] DOB: [birth year]  [name] is a 87 y.o. year old female who is a primary care patient of Luking, Elayne Snare, MD. I reached out to [name] by phone today in response to a referral sent by Ms. Denese Killings Cicio's PCP, Dr. Wolfgang Phoenix      An unsuccessful telephone outreach was attempted today. The patient was referred to the case management team for assistance with care management and care coordination.   Follow Up Plan: A HIPAA compliant phone message was left for the patient providing contact information and requesting a return call. The care management team will reach out to the patient again over the next 2 days.  If patient returns call to provider office, please advise to call [place] at [phone].  [facility]  Direct Dial: [phone]

## 2020-12-24 NOTE — Patient Instructions (Addendum)
Visit Information   PATIENT GOALS:   Goals Addressed             This Visit's Progress    Track and Manage My Blood Pressure-Hypertension       Timeframe:  Long-Range Goal Priority:  Medium Start Date:        12/24/2020                     Expected End Date:    06/26/2021                   Follow Up Date - 01/14/2021   - check blood pressure 3 times per week - write blood pressure results in a log or diary  - continue to have patient's daughter check blood pressure - follow low sodium diet - look over low sodium diet information sent via My Chart - take medications as prescribed   Why is this important?   You won't feel high blood pressure, but it can still hurt your blood vessels.  High blood pressure can cause heart or kidney problems. It can also cause a stroke.  Making lifestyle changes like losing a little weight or eating less salt will help.  Checking your blood pressure at home and at different times of the day can help to control blood pressure.  If the doctor prescribes medicine remember to take it the way the doctor ordered.  Call the office if you cannot afford the medicine or if there are questions about it.     Notes:      Track and Manage Symptoms-Heart Failure       Timeframe:  Long-Range Goal Priority:  High Start Date:        12/24/2020                     Expected End Date:     06/26/2021                  Follow Up Date- 01/14/2021   - develop a rescue plan- action plan sent via My Chart - follow rescue plan if symptoms flare-up - know when to call the doctor  - avoid salty foods such as chips, snacks, fast food - call doctor early on for change in health status, symptoms such as swelling, dry cough, decreased urination, shortness of breath - call 911 for unrelieved shortness of breath or chest pain - take fluid pill as prescribed - be expecting a call from social worker for issues about assistance in the home - be expecting a call from care guide for  any needed resources and obtaining smoke detectors    Why is this important?   You will be able to handle your symptoms better if you keep track of them.  Making some simple changes to your lifestyle will help.  Eating healthy is one thing you can do to take good care of yourself.    Notes:         Consent to CCM Services: [name] was given information about Chronic Care Management services today including:  CCM service includes personalized support from designated clinical staff supervised by her physician, including individualized plan of care and coordination with other care providers 24/7 contact phone numbers for assistance for urgent and routine care needs. Service will only be billed when office clinical staff spend 20 minutes or more in a month to coordinate care. Only one practitioner may furnish and bill the  service in a calendar month. The patient may stop CCM services at any time (effective at the end of the month) by phone call to the office staff. The patient will be responsible for cost sharing (co-pay) of up to 20% of the service fee (after annual deductible is met).  Patient agreed to services and verbal consent obtained.   Patient verbalizes understanding of instructions provided today and agrees to view in [place].   Telephone follow up appointment with care management team member scheduled for:  8/10/2022Heart Failure Action Plan A heart failure action plan helps you understand what to do when you have symptoms of heart failure. Your action plan is a color-coded plan that lists the symptoms to watch for and indicates what actions to take. If you have symptoms in the red zone, you need medical care right away. If you have symptoms in the yellow zone, you are having problems. If you have symptoms in the green zone, you are doing well. Follow the plan that was created by you and your health care provider. Reviewyour plan each time you visit your health care  provider. Red zone These signs and symptoms mean you should get medical help right away: You have trouble breathing when resting. You have a dry cough that is getting worse. You have swelling or pain in your legs or abdomen that is getting worse. You suddenly gain more than 2-3 lb (0.9-1.4 kg) in 24 hours, or more than 5 lb (2.3 kg) in a week. This amount may be more or less depending on your condition. You have trouble staying awake or you feel confused. You have chest pain. You do not have an appetite. You pass out. You have worsening sadness or depression. If you have any of these symptoms, call your local emergency services (911 in the [place] right away. Do not drive yourself to the hospital. Yellow zone These signs and symptoms mean your condition may be getting worse and you should make some changes: You have trouble breathing when you are active, or you need to sleep with your head raised on extra pillows to help you breathe. You have swelling in your legs or abdomen. You gain 2-3 lb (0.9-1.4 kg) in 24 hours, or 5 lb (2.3 kg) in a week. This amount may be more or less depending on your condition. You get tired easily. You have trouble sleeping. You have a dry cough. If you have any of these symptoms: Contact your health care provider within the next day. Your health care provider may adjust your medicines. Green zone These signs mean you are doing well and can continue what you are doing: You do not have shortness of breath. You have very little swelling or no new swelling. Your weight is stable (no gain or loss). You have a normal activity level. You do not have chest pain or any other new symptoms. Follow these instructions at home: Take over-the-counter and prescription medicines only as told by your health care provider. Weigh yourself daily. Your target weight is __________ lb (__________ kg). Call your health care provider if you gain more than __________ lb (__________  kg) in 24 hours, or more than __________ lb (__________ kg) in a week. Health care provider name: _____________________________________________________ Health care provider phone number: _____________________________________________________ Eat a heart-healthy diet. Work with a diet and nutrition specialist (dietitian) to create an eating plan that is best for you. Keep all follow-up visits. This is important. Where to find more information American Heart Association: www.heart.org  Summary A heart failure action plan helps you understand what to do when you have symptoms of heart failure. Follow the action plan that was created by you and your health care provider. Get help right away if you have any symptoms in the red zone. This information is not intended to replace advice given to you by your health care provider. Make sure you discuss any questions you have with your healthcare provider. Document Revised: 01/07/2020 Document Reviewed: 01/07/2020 Elsevier Patient Education  [email or web address]. [email or web address]">  DASH Eating Plan DASH stands for Dietary Approaches to Stop Hypertension. The DASH eating plan is a healthy eating plan that has been shown to: Reduce high blood pressure (hypertension). Reduce your risk for type 2 diabetes, heart disease, and stroke. Help with weight loss. What are tips for following this plan? Reading food labels Check food labels for the amount of salt (sodium) per serving. Choose foods with less than 5 percent of the Daily Value of sodium. Generally, foods with less than 300 milligrams (mg) of sodium per serving fit into this eating plan. To find whole grains, look for the word "whole" as the first word in the ingredient list. Shopping Buy products labeled as "low-sodium" or "no salt added." Buy fresh foods. Avoid canned foods and pre-made or frozen meals. Cooking Avoid adding salt when cooking. Use  salt-free seasonings or herbs instead of table salt or sea salt. Check with your health care provider or pharmacist before using salt substitutes. Do not fry foods. Cook foods using healthy methods such as baking, boiling, grilling, roasting, and broiling instead. Cook with heart-healthy oils, such as olive, canola, avocado, soybean, or sunflower oil. Meal planning  Eat a balanced diet that includes: 4 or more servings of fruits and 4 or more servings of vegetables each day. Try to fill one-half of your plate with fruits and vegetables. 6-8 servings of whole grains each day. Less than 6 oz (170 g) of lean meat, poultry, or fish each day. A 3-oz (85-g) serving of meat is about the same size as a deck of cards. One egg equals 1 oz (28 g). 2-3 servings of low-fat dairy each day. One serving is 1 cup (237 mL). 1 serving of nuts, seeds, or beans 5 times each week. 2-3 servings of heart-healthy fats. Healthy fats called omega-3 fatty acids are found in foods such as walnuts, flaxseeds, fortified milks, and eggs. These fats are also found in cold-water fish, such as sardines, salmon, and mackerel. Limit how much you eat of: Canned or prepackaged foods. Food that is high in trans fat, such as some fried foods. Food that is high in saturated fat, such as fatty meat. Desserts and other sweets, sugary drinks, and other foods with added sugar. Full-fat dairy products. Do not salt foods before eating. Do not eat more than 4 egg yolks a week. Try to eat at least 2 vegetarian meals a week. Eat more home-cooked food and less restaurant, buffet, and fast food.  Lifestyle When eating at a restaurant, ask that your food be prepared with less salt or no salt, if possible. If you drink alcohol: Limit how much you use to: 0-1 drink a day for women who are not pregnant. 0-2 drinks a day for men. Be aware of how much alcohol is in your drink. In the [place] one drink equals one 12 oz bottle of beer (355 mL), one  5 oz glass of wine (148 mL), or one 1 oz glass of hard liquor (44  mL). General information Avoid eating more than 2,300 mg of salt a day. If you have hypertension, you may need to reduce your sodium intake to 1,500 mg a day. Work with your health care provider to maintain a healthy body weight or to lose weight. Ask what an ideal weight is for you. Get at least 30 minutes of exercise that causes your heart to beat faster (aerobic exercise) most days of the week. Activities may include walking, swimming, or biking. Work with your health care provider or dietitian to adjust your eating plan to your individual calorie needs. What foods should I eat? Fruits All fresh, dried, or frozen fruit. Canned fruit in natural juice (without addedsugar). Vegetables Fresh or frozen vegetables (raw, steamed, roasted, or grilled). Low-sodium or reduced-sodium tomato and vegetable juice. Low-sodium or reduced-sodium tomatosauce and tomato paste. Low-sodium or reduced-sodium canned vegetables. Grains Whole-grain or whole-wheat bread. Whole-grain or whole-wheat pasta. Brown rice. [place]. Bulgur. Whole-grain and low-sodium cereals. Pita bread.Low-fat, low-sodium crackers. Whole-wheat flour tortillas. Meats and other proteins Skinless chicken or [place]. Ground chicken or [place]. Pork with fat trimmed off. Fish and seafood. Egg whites. Dried beans, peas, or lentils. Unsalted nuts, nut butters, and seeds. Unsalted canned beans. Lean cuts of beef with fat trimmed off. Low-sodium, lean precooked or cured meat, such as sausages or meatloaves. Dairy Low-fat (1%) or fat-free (skim) milk. Reduced-fat, low-fat, or fat-free cheeses. Nonfat, low-sodium ricotta or cottage cheese. Low-fat or nonfatyogurt. Low-fat, low-sodium cheese. Fats and oils Soft margarine without trans fats. Vegetable oil. Reduced-fat, low-fat, or light mayonnaise and salad dressings (reduced-sodium). Canola, safflower, olive, avocado, soybean,  andsunflower oils. Avocado. Seasonings and condiments Herbs. Spices. Seasoning mixes without salt. Other foods Unsalted popcorn and pretzels. Fat-free sweets. The items listed above may not be a complete list of foods and beverages you can eat. Contact a dietitian for more information. What foods should I avoid? Fruits Canned fruit in a light or heavy syrup. Fried fruit. Fruit in cream or buttersauce. Vegetables Creamed or fried vegetables. Vegetables in a cheese sauce. Regular canned vegetables (not low-sodium or reduced-sodium). Regular canned tomato sauce and paste (not low-sodium or reduced-sodium). Regular tomato and vegetable juice(not low-sodium or reduced-sodium). Angie Fava. Olives. Grains Baked goods made with fat, such as croissants, muffins, or some breads. Drypasta or rice meal packs. Meats and other proteins Fatty cuts of meat. Ribs. Fried meat. Berniece Salines. Bologna, salami, and other precooked or cured meats, such as sausages or meat loaves. Fat from the back of a pig (fatback). Bratwurst. Salted nuts and seeds. Canned beans with added salt. Canned orsmoked fish. Whole eggs or egg yolks. Chicken or [place] with skin. Dairy Whole or 2% milk, cream, and half-and-half. Whole or full-fat cream cheese. Whole-fat or sweetened yogurt. Full-fat cheese. Nondairy creamers. Whippedtoppings. Processed cheese and cheese spreads. Fats and oils Butter. Stick margarine. Lard. Shortening. Ghee. Bacon fat. Tropical oils, suchas coconut, palm kernel, or palm oil. Seasonings and condiments Onion salt, garlic salt, seasoned salt, table salt, and sea salt. Worcestershire sauce. Tartar sauce. Barbecue sauce. Teriyaki sauce. Soy sauce, including reduced-sodium. Steak sauce. Canned and packaged gravies. Fish sauce. Oyster sauce. Cocktail sauce. Store-bought horseradish. Ketchup. Mustard. Meat flavorings and tenderizers. Bouillon cubes. Hot sauces. Pre-made or packaged marinades. Pre-made or packaged taco seasonings.  Relishes. Regular saladdressings. Other foods Salted popcorn and pretzels. The items listed above may not be a complete list of foods and beverages you should avoid. Contact a dietitian for more information. Where to find more information National Heart, Lung,  and Blood Institute: https://wilson-eaton.com/ American Heart Association: www.heart.org Academy of Nutrition and Dietetics: www.eatright.[place]: www.kidney.org Summary The DASH eating plan is a healthy eating plan that has been shown to reduce high blood pressure (hypertension). It may also reduce your risk for type 2 diabetes, heart disease, and stroke. When on the DASH eating plan, aim to eat more fresh fruits and vegetables, whole grains, lean proteins, low-fat dairy, and heart-healthy fats. With the DASH eating plan, you should limit salt (sodium) intake to 2,300 mg a day. If you have hypertension, you may need to reduce your sodium intake to 1,500 mg a day. Work with your health care provider or dietitian to adjust your eating plan to your individual calorie needs. This information is not intended to replace advice given to you by your health care provider. Make sure you discuss any questions you have with your healthcare provider. Document Revised: 04/27/2019 Document Reviewed: 04/27/2019 Elsevier Patient Education  2022 [facility], BSN RN Case Manager [facility] [phone]   CLINICAL CARE PLAN: Patient Care Plan: Heart Failure (Adult)     Problem Identified: Symptom Exacerbation (Heart Failure)      Long-Range Goal: Symptom Exacerbation Prevented or Minimized   Start Date: 12/24/2020  Expected End Date: 06/26/2021  This Visit's Progress: On track  Priority: High  Note:   Current Barriers:  Knowledge deficit related to basic heart failure pathophysiology and self care management- pt is unable to weigh at present, pt does not have advanced directives and  declined information today.  Daughter reports she thinks patient has medicaid but has never applied for any assistance in the home, has not been sure about hospice or palliative care but taking care of patient is going to be too much for patient's spouse, they are interested in any guidance/ resources. Financial strain- unable to afford to pay for in home care/ assistance Unable to self administer medications as prescribed- daughter provides oversight Unable to perform ADLs independently- patient is dependent for all ADL's ,  has DME, unable to walk at present, has home health services Case Manager Clinical Goal(s):  patient will verbalize understanding of Heart Failure Action Plan and when to call doctor Interventions:  Collaboration with Kathyrn Drown, MD regarding development and update of comprehensive plan of care as evidenced by provider attestation and co-signature Inter-disciplinary care team collaboration (see longitudinal plan of care) Basic overview and discussion of pathophysiology of Heart Failure reviewed  Provided verbal education on low sodium diet Provided written education on low sodium diet via My Chart Reviewed Heart Failure Action Plan in depth and provided written copy via My Chart Assessed need for readable accurate scales in home- pt has scales but is unable to stand and weigh Reviewed role of diuretics in prevention of fluid overload and management of heart failure Reviewed with daughter that some local fire departments give free smoke detectors (daughter would like care guide to reach out for any needed resources) Placed order for LCSW services for level of care concerns, caregiver stress, to verify medicaid and how to apply if pt does not have  (family has agreed to see if patient qualifies for in home hospice and would like to pursue this option) Reviewed medications and importance of taking as prescribed Reviewed importance/ rationale of completing advanced  directives Sent message to primary care provider that pt/ family is ready for in home hospice services Self-Care Activities: Calls provider office for new concerns or questions Patient  Goals: - develop a rescue plan- action plan sent via My Chart - follow rescue plan if symptoms flare-up - know when to call the doctor  - avoid salty foods such as chips, snacks, fast food - call doctor early on for change in health status, symptoms such as swelling, dry cough, decreased urination, shortness of breath - call 911 for unrelieved shortness of breath or chest pain - take fluid pill as prescribed - be expecting a call from social worker for issues about assistance in the home - be expecting a call from care guide for any needed resources and obtaining smoke detectors - keep legs up while sitting - use salt in moderation - watch for swelling in feet, ankles and legs every day Follow Up Plan: Telephone follow up appointment with care management team member scheduled for:   01/14/2021    Problem Identified: Activity Tolerance (Heart Failure)      Problem Identified: Obstructive Sleep Apnea (Heart Failure)      Patient Care Plan: Hypertension (Adult) with comorbities CKD stage 4/ HLD     Problem Identified: Hypertension with co-morbidities CKD stage 4/ HLD   Priority: Medium     Long-Range Goal: Hypertension Monitored   Start Date: 12/24/2020  Expected End Date: 06/26/2021  This Visit's Progress: On track  Priority: Medium  Note:   Objective:  Last practice recorded BP readings:  BP Readings from Last 3 Encounters:  11/25/20 (!) 156/82  11/18/20 134/86  10/21/20 134/74   Most recent eGFR/CrCl:  Lab Results  Component Value Date   EGFR 15 (L) 10/21/2020    No components found for: CRCL Current Barriers:  Knowledge Deficits related to basic understanding of hypertension pathophysiology and self care management- family reports they have granddaughter that has been assisting  patient's spouse in the home and she will be leaving August 4 and going back to school and spouse does not have enough help in the home even with what assistance adult daughters provide, daughter [name] reports they have talked about palliative care, hospice, etc but have not made a decision, [name] reports patient has medicaid but they have not pursued obtaining any assistance in the home and would like LCSW assistance for options.  Patient remains in recliner most of the time, is unable to walk at present, wears adult diapers, has no cognitive impairment per daughter, upon assessment daughter reports there are no working smoke Careers adviser in the home and she would like resource for this. Does not adhere to provider recommendations re: does not monitor blood pressure regularly, daughter is RN and checks at times Does not attend all scheduled provider appointments- unable to leave home to attend appointments, primary care provider completed home visit Unable to perform ADLs independently- pt is dependent on her spouse with whom she lives with, adult daughters and grandchildren assist as needed, provide oversight for medications, pt has DME in the home, has home health services through [facility] with RN providing wound care to [facility].   NOTE- daughter [name] called RN care manager back and states she, pt and family are ready to pursue hospice services. Case Manager Clinical Goal(s):  patient will verbalize understanding of plan for hypertension management patient will demonstrate improved adherence to prescribed treatment plan for hypertension as evidenced by taking all medications as prescribed, monitoring and recording blood pressure as directed, adhering to low sodium/DASH diet Interventions:  Collaboration with Kathyrn Drown, MD regarding development and update of comprehensive plan of care as evidenced by provider attestation and  co-signature Inter-disciplinary care team collaboration (see longitudinal plan of  care) Evaluation of current treatment plan related to hypertension self management and patient's adherence to plan as established by provider. Provided education to patient re: stroke prevention, s/s of heart attack and stroke, DASH diet, complications of uncontrolled blood pressure Reviewed medications with patient and discussed importance of compliance Discussed plans with patient for ongoing care management follow up and provided patient with direct contact information for care management team Advised patient, providing education and rationale, to monitor blood pressure daily and record, calling PCP for findings outside established parameters.  Reviewed scheduled/upcoming provider appointments including:   primary care provider has been completing home visits, pt unable to leave home at present RN care manager discussed palliative care, hospice, intermittent home health services, PCS services, etc.   Referral placed for LCSW for assistance with level of care issues, resources, caregiver stress Referral placed for care guide for resource for smoke detectors RN care manager sent in basket to primary care provider reporting daughter and family have decided they want to pursue in home hospice services RN care manager reviewed patient changing positions q 2 hours and standing with assistance to prevent skin breakdown Self-Care Activities: Calls provider office for new concerns, questions, or BP outside discussed parameters Checks BP and records as discussed Follows a low sodium diet/DASH diet Patient Goals: - check blood pressure 3 times per week - write blood pressure results in a log or diary  - continue to have patient's daughter check blood pressure - follow low sodium diet - look over low sodium diet information sent via My Chart - take medications as prescribed Follow Up Plan: Telephone follow up appointment with care management team member scheduled for: 01/14/2021

## 2020-12-25 DIAGNOSIS — M1030 Gout due to renal impairment, unspecified site: Secondary | ICD-10-CM | POA: Diagnosis not present

## 2020-12-25 DIAGNOSIS — I872 Venous insufficiency (chronic) (peripheral): Secondary | ICD-10-CM | POA: Diagnosis not present

## 2020-12-25 DIAGNOSIS — D631 Anemia in chronic kidney disease: Secondary | ICD-10-CM | POA: Diagnosis not present

## 2020-12-25 DIAGNOSIS — E1122 Type 2 diabetes mellitus with diabetic chronic kidney disease: Secondary | ICD-10-CM | POA: Diagnosis not present

## 2020-12-25 DIAGNOSIS — E1151 Type 2 diabetes mellitus with diabetic peripheral angiopathy without gangrene: Secondary | ICD-10-CM | POA: Diagnosis not present

## 2020-12-25 DIAGNOSIS — N184 Chronic kidney disease, stage 4 (severe): Secondary | ICD-10-CM | POA: Diagnosis not present

## 2020-12-25 DIAGNOSIS — I5033 Acute on chronic diastolic (congestive) heart failure: Secondary | ICD-10-CM | POA: Diagnosis not present

## 2020-12-25 DIAGNOSIS — I272 Pulmonary hypertension, unspecified: Secondary | ICD-10-CM | POA: Diagnosis not present

## 2020-12-25 DIAGNOSIS — I130 Hypertensive heart and chronic kidney disease with heart failure and stage 1 through stage 4 chronic kidney disease, or unspecified chronic kidney disease: Secondary | ICD-10-CM | POA: Diagnosis not present

## 2020-12-25 NOTE — Telephone Encounter (Signed)
[name] [phone]

## 2020-12-25 NOTE — Telephone Encounter (Signed)
Left message to return call 

## 2020-12-26 ENCOUNTER — Other Ambulatory Visit: Payer: Self-pay | Admitting: *Deleted

## 2020-12-26 DIAGNOSIS — I5020 Unspecified systolic (congestive) heart failure: Secondary | ICD-10-CM | POA: Diagnosis not present

## 2020-12-26 DIAGNOSIS — N184 Chronic kidney disease, stage 4 (severe): Secondary | ICD-10-CM

## 2020-12-26 NOTE — Chronic Care Management (AMB) (Signed)
  Chronic Care Management   Outreach Note  12/26/2020 Name: [name] MRN: [id number] DOB: 23-May-1927  [name] is a 87 y.o. year old female who is a primary care patient of Luking, Elayne Snare, MD. I reached out to [name] by phone today in response to a referral sent by Ms. Denese Killings Kynard's PCP, Dr. Wolfgang Phoenix.      A second telephone outreach was attempted today. The patient was referred to the case management team for assistance with care management and care coordination.   Follow Up Plan: The care management team will reach out to the patient again over the next 7 days.  If patient returns call to provider office, please advise to call [place] at [phone].  [place] [facility]  Direct Dial: [phone]

## 2020-12-29 ENCOUNTER — Telehealth: Payer: Self-pay

## 2020-12-29 NOTE — Telephone Encounter (Signed)
   Telephone encounter was:  Unsuccessful.  12/29/2020 Name: [name] MRN: [id number] DOB: Dec 27, 1926  Unsuccessful outbound call made today to assist with:   smoke alarm.  Outreach Attempt:  1st Attempt  A HIPAA compliant voice message was left requesting a return call.  Instructed patient to call back at [phone].  Andreya Lacks, AAS Paralegal, [facility]  [address], [place] [address] ??[email or web address]  ?? [id number]   [email or web address]

## 2020-12-30 ENCOUNTER — Other Ambulatory Visit: Payer: Self-pay | Admitting: Family Medicine

## 2020-12-30 ENCOUNTER — Encounter: Payer: Self-pay | Admitting: Family Medicine

## 2020-12-30 NOTE — Chronic Care Management (AMB) (Signed)
  Chronic Care Management   Note  12/30/2020 Name: GETSEMANI VOROS MRN: [id number] DOB: 1926-07-21  JAYDEEN TOCCO is a 87 y.o. year old female who is a primary care patient of Luking, Elayne Snare, MD. IDOLA GANIM is currently enrolled in care management services. An additional referral for Social Worker was placed.   Follow up plan: A third unsuccessful telephone outreach attempt made. A HIPAA compliant phone message was left for the patient providing contact information and requesting a return call. We have been unable to make contact with the patient for follow up. The care management team is available to follow up with the patient after provider conversation with the patient regarding recommendation for care management engagement and subsequent re-referral to the care management team. If patient returns call to provider office, please advise to call [place] at [facility]  Direct Dial: [phone]

## 2020-12-30 NOTE — Chronic Care Management (AMB) (Signed)
  Chronic Care Management   Note  12/30/2020 Name: [name] MRN: [id number] DOB: 15-May-1927  [name] is a 87 y.o. year old female who is a primary care patient of Luking, Elayne Snare, MD. [name] is currently enrolled in care management services. An additional referral for Social Worker was placed.   Follow up plan: Telephone appointment with care management team member scheduled for:01/08/2021  Orion Vandervort  Care Guide, Embedded Care Coordination [place]  [facility] Management  Direct Dial: [phone]

## 2021-01-01 NOTE — Telephone Encounter (Signed)
[facility] stated that patient has been discharged from their services as the family wanted to proceed with home hospice- they will not be able to repeat her labs in one month.

## 2021-01-02 NOTE — Telephone Encounter (Signed)
Unfortunately I do not have any particular contacts for helpers  Nurses-please see if you can send a message to [name] to see if they can help provide family contacts that they can utilize to get helpers

## 2021-01-02 NOTE — Telephone Encounter (Signed)
Left message to return call with [name](DPR)

## 2021-01-02 NOTE — Telephone Encounter (Signed)
With family proceeding forward with home hospice no further lab work is indicated  (Please make sure with [name] that they are in fact moving forward with the hospice-even with hospice there is a limit to how many helpers etc. that would be sent out to the home-it would still be advisable for them to do the social worker consult on August 4 with [name] I believe-please try to assist family with any additional logistical challenges they may be facing regarding [facility] care)

## 2021-01-02 NOTE — Telephone Encounter (Signed)
[name](DPR) notified and verbalized understanding and stated that Hospice has reached out to family and is coming out today to do initial assessment and they also advised they do not have sitter services ut can help them find resources in the community.

## 2021-01-05 ENCOUNTER — Telehealth: Payer: Self-pay | Admitting: *Deleted

## 2021-01-05 ENCOUNTER — Ambulatory Visit (INDEPENDENT_AMBULATORY_CARE_PROVIDER_SITE_OTHER): Admitting: *Deleted

## 2021-01-05 DIAGNOSIS — N184 Chronic kidney disease, stage 4 (severe): Secondary | ICD-10-CM | POA: Diagnosis not present

## 2021-01-05 DIAGNOSIS — Z515 Encounter for palliative care: Secondary | ICD-10-CM

## 2021-01-05 DIAGNOSIS — I5033 Acute on chronic diastolic (congestive) heart failure: Secondary | ICD-10-CM

## 2021-01-05 DIAGNOSIS — R531 Weakness: Secondary | ICD-10-CM | POA: Diagnosis not present

## 2021-01-05 DIAGNOSIS — I739 Peripheral vascular disease, unspecified: Secondary | ICD-10-CM

## 2021-01-05 DIAGNOSIS — R270 Ataxia, unspecified: Secondary | ICD-10-CM | POA: Diagnosis not present

## 2021-01-05 DIAGNOSIS — I10 Essential (primary) hypertension: Secondary | ICD-10-CM | POA: Diagnosis not present

## 2021-01-05 NOTE — Telephone Encounter (Signed)
CSW made contact with pt's daughter today and confirmed pt's identity.  Per daughter, they have met with the Hospice staff and are proceeding with their services and support. "They delivered a hospital bed, potty chair and oxygen".   Daughter is working on arranging for caregivers to help with "bathing and sitting with mom" as the pt's granddaughter who has been doing so is heading back to school 01/08/21 and won't be able to assist.   CSW advised daughter of plans to sign off since the Hospice team is involved and assisting them going forward at this time. CSW assured her that Hospice will be a good team of care and support for pt and family; and to call if any needs arise.   CSW will sign off.   [name] MSW, LCSW Licensed Clinical Social Worker [place] [phone]

## 2021-01-06 ENCOUNTER — Telehealth: Payer: Self-pay

## 2021-01-06 NOTE — Telephone Encounter (Signed)
   Telephone encounter was:  Unsuccessful.  01/06/2021 Name: [name] MRN: [id number] DOB: 1926-11-07  Unsuccessful outbound call made today to assist with:   smoke detector.  Outreach Attempt:  2nd Attempt  A HIPAA compliant voice message was left requesting a return call.  Instructed patient to call back at [phone].  Riah Kehoe, AAS Paralegal, [facility]  [address], [place] [address] ??[name]  ?? [id number]   [email or web address]

## 2021-01-06 NOTE — Progress Notes (Signed)
Per pt's daughter, [name], they have signed up with Hospice care. CSW will sign off.  [name] MSW, LCSW Licensed Clinical Social Worker [place] [phone]

## 2021-01-08 ENCOUNTER — Telehealth: Payer: Self-pay

## 2021-01-08 ENCOUNTER — Telehealth: Payer: Medicare HMO

## 2021-01-08 NOTE — Telephone Encounter (Signed)
   Telephone encounter was:  Unsuccessful.  01/08/2021 Name: EVIN SANDHU MRN: [id number] DOB: [birth year]  Unsuccessful outbound call made today to assist with:  Left message on daughter's voicemail to return my call regarding smoke alarm for her mother Myndee Creque.   Outreach Attempt:  3rd Attempt.  Referral closed unable to contact patient.  A HIPAA compliant voice message was left requesting a return call.  Instructed patient to call back at [phone].  Teshawn Moan, AAS Paralegal, [facility]  [address], [place] [address] ??[email or web address]  ?? [id number]   [email or web address]

## 2021-01-08 NOTE — Progress Notes (Signed)
Remote pacemaker transmission.   

## 2021-01-14 ENCOUNTER — Ambulatory Visit: Payer: Medicare HMO | Admitting: *Deleted

## 2021-01-14 DIAGNOSIS — I5033 Acute on chronic diastolic (congestive) heart failure: Secondary | ICD-10-CM

## 2021-01-14 DIAGNOSIS — I10 Essential (primary) hypertension: Secondary | ICD-10-CM

## 2021-01-14 NOTE — Chronic Care Management (AMB) (Signed)
   01/14/2021  [name] 01/24/1927 [id number]   Patient now has in home hospice services.  RN care manager spoke with patient's daughter [name] and verified all services are in place.  Per [name], no concerns at this time. Patient discharged from chronic care management services. Previous collaboration with LCSW [name] and she is aware.  [name] [facility], BSN RN Case Manager [facility] [phone]

## 2021-02-04 ENCOUNTER — Ambulatory Visit: Payer: Medicare HMO | Admitting: Internal Medicine

## 2021-02-05 ENCOUNTER — Telehealth: Payer: Self-pay | Admitting: Family Medicine

## 2021-02-05 DIAGNOSIS — R531 Weakness: Secondary | ICD-10-CM | POA: Diagnosis not present

## 2021-02-05 DIAGNOSIS — R270 Ataxia, unspecified: Secondary | ICD-10-CM | POA: Diagnosis not present

## 2021-02-05 NOTE — Telephone Encounter (Signed)
  Left message for patient to call back and schedule Medicare Annual Wellness Visit (AWV) in office.   If unable to come into the office for AWV,  please offer to do virtually or by telephone.  No hx of AWV eligible for AWVI as of 06/07/2009  Please schedule at anytime with [facility].      40 Minutes appointment   Any questions, please call me at [phone]

## 2021-02-18 ENCOUNTER — Ambulatory Visit: Payer: Medicare HMO | Admitting: Family Medicine

## 2021-03-03 ENCOUNTER — Ambulatory Visit (INDEPENDENT_AMBULATORY_CARE_PROVIDER_SITE_OTHER)

## 2021-03-03 ENCOUNTER — Other Ambulatory Visit: Payer: Self-pay

## 2021-03-03 ENCOUNTER — Telehealth: Payer: Self-pay

## 2021-03-03 VITALS — Ht 63.0 in | Wt 163.0 lb

## 2021-03-03 DIAGNOSIS — Z0000 Encounter for general adult medical examination without abnormal findings: Secondary | ICD-10-CM | POA: Diagnosis not present

## 2021-03-03 NOTE — Telephone Encounter (Signed)
Script sent to [facility]

## 2021-03-03 NOTE — Telephone Encounter (Signed)
[name] contacted. Pt is currently getting oxygen from [place]; currently on 2 L. Script written out and on provider door for signature. Please advise. Thank you

## 2021-03-03 NOTE — Patient Instructions (Signed)
[name] , Thank you for taking time to come for your Medicare Wellness Visit. I appreciate your ongoing commitment to your health goals. Please review the following plan we discussed and let me know if I can assist you in the future.   Screening recommendations/referrals: Colonoscopy: No longer required due to age. Mammogram: No longer required due to age. Bone Density: Unable to do due to mobility issues. Recommended yearly ophthalmology/optometry visit for glaucoma screening and checkup Recommended yearly dental visit for hygiene and checkup  Vaccinations: Influenza vaccine: Done 04/04/2020 Repeat annually  Pneumococcal vaccine: Done 04/09/2015, 11/08/2016 Tdap vaccine: Done 06/07/2000 Repeat in 10 years  Shingles vaccine: Shingrix discussed. Please contact your pharmacy for coverage information.     Covid-19:Due  Advanced directives: Advance directive discussed with you today. Even though you declined this today, please call our office should you change your mind, and we can give you the proper paperwork for you to fill out.   Conditions/risks identified:  Drink water and eat lots of fruits and vegetables.   Next appointment: Follow up in one year for your annual wellness visit 2023   Preventive Care 65 Years and Older, Female Preventive care refers to lifestyle choices and visits with your health care provider that can promote health and wellness. What does preventive care include? A yearly physical exam. This is also called an annual well check. Dental exams once or twice a year. Routine eye exams. Ask your health care provider how often you should have your eyes checked. Personal lifestyle choices, including: Daily care of your teeth and gums. Regular physical activity. Eating a healthy diet. Avoiding tobacco and drug use. Limiting alcohol use. Practicing safe sex. Taking low-dose aspirin every day. Taking vitamin and mineral supplements as recommended by your health  care provider. What happens during an annual well check? The services and screenings done by your health care provider during your annual well check will depend on your age, overall health, lifestyle risk factors, and family history of disease. Counseling  Your health care provider may ask you questions about your: Alcohol use. Tobacco use. Drug use. Emotional well-being. Home and relationship well-being. Sexual activity. Eating habits. History of falls. Memory and ability to understand (cognition). Work and work Statistician. Reproductive health. Screening  You may have the following tests or measurements: Height, weight, and BMI. Blood pressure. Lipid and cholesterol levels. These may be checked every 5 years, or more frequently if you are over 64 years old. Skin check. Lung cancer screening. You may have this screening every year starting at age 27 if you have a 30-pack-year history of smoking and currently smoke or have quit within the past 15 years. Fecal occult blood test (FOBT) of the stool. You may have this test every year starting at age 76. Flexible sigmoidoscopy or colonoscopy. You may have a sigmoidoscopy every 5 years or a colonoscopy every 10 years starting at age 39. Hepatitis C blood test. Hepatitis B blood test. Sexually transmitted disease (STD) testing. Diabetes screening. This is done by checking your blood sugar (glucose) after you have not eaten for a while (fasting). You may have this done every 1-3 years. Bone density scan. This is done to screen for osteoporosis. You may have this done starting at age 89. Mammogram. This may be done every 1-2 years. Talk to your health care provider about how often you should have regular mammograms. Talk with your health care provider about your test results, treatment options, and if necessary, the need for  more tests. Vaccines  Your health care provider may recommend certain vaccines, such as: Influenza vaccine. This is  recommended every year. Tetanus, diphtheria, and acellular pertussis (Tdap, Td) vaccine. You may need a Td booster every 10 years. Zoster vaccine. You may need this after age 82. Pneumococcal 13-valent conjugate (PCV13) vaccine. One dose is recommended after age 71. Pneumococcal polysaccharide (PPSV23) vaccine. One dose is recommended after age 54. Talk to your health care provider about which screenings and vaccines you need and how often you need them. This information is not intended to replace advice given to you by your health care provider. Make sure you discuss any questions you have with your health care provider. Document Released: 06/20/2015 Document Revised: 02/11/2016 Document Reviewed: 03/25/2015 Elsevier Interactive Patient Education  2017 [place] Prevention in the Home Falls can cause injuries. They can happen to people of all ages. There are many things you can do to make your home safe and to help prevent falls. What can I do on the outside of my home? Regularly fix the edges of walkways and driveways and fix any cracks. Remove anything that might make you trip as you walk through a door, such as a raised step or threshold. Trim any bushes or trees on the path to your home. Use bright outdoor lighting. Clear any walking paths of anything that might make someone trip, such as rocks or tools. Regularly check to see if handrails are loose or broken. Make sure that both sides of any steps have handrails. Any raised decks and porches should have guardrails on the edges. Have any leaves, snow, or ice cleared regularly. Use sand or salt on walking paths during winter. Clean up any spills in your garage right away. This includes oil or grease spills. What can I do in the bathroom? Use night lights. Install grab bars by the toilet and in the tub and shower. Do not use towel bars as grab bars. Use non-skid mats or decals in the tub or shower. If you need to sit down in  the shower, use a plastic, non-slip stool. Keep the floor dry. Clean up any water that spills on the floor as soon as it happens. Remove soap buildup in the tub or shower regularly. Attach bath mats securely with double-sided non-slip rug tape. Do not have throw rugs and other things on the floor that can make you trip. What can I do in the bedroom? Use night lights. Make sure that you have a light by your bed that is easy to reach. Do not use any sheets or blankets that are too big for your bed. They should not hang down onto the floor. Have a firm chair that has side arms. You can use this for support while you get dressed. Do not have throw rugs and other things on the floor that can make you trip. What can I do in the kitchen? Clean up any spills right away. Avoid walking on wet floors. Keep items that you use a lot in easy-to-reach places. If you need to reach something above you, use a strong step stool that has a grab bar. Keep electrical cords out of the way. Do not use floor polish or wax that makes floors slippery. If you must use wax, use non-skid floor wax. Do not have throw rugs and other things on the floor that can make you trip. What can I do with my stairs? Do not leave any items on the stairs. Make  sure that there are handrails on both sides of the stairs and use them. Fix handrails that are broken or loose. Make sure that handrails are as long as the stairways. Check any carpeting to make sure that it is firmly attached to the stairs. Fix any carpet that is loose or worn. Avoid having throw rugs at the top or bottom of the stairs. If you do have throw rugs, attach them to the floor with carpet tape. Make sure that you have a light switch at the top of the stairs and the bottom of the stairs. If you do not have them, ask someone to add them for you. What else can I do to help prevent falls? Wear shoes that: Do not have high heels. Have rubber bottoms. Are comfortable  and fit you well. Are closed at the toe. Do not wear sandals. If you use a stepladder: Make sure that it is fully opened. Do not climb a closed stepladder. Make sure that both sides of the stepladder are locked into place. Ask someone to hold it for you, if possible. Clearly mark and make sure that you can see: Any grab bars or handrails. First and last steps. Where the edge of each step is. Use tools that help you move around (mobility aids) if they are needed. These include: Canes. Walkers. Scooters. Crutches. Turn on the lights when you go into a dark area. Replace any light bulbs as soon as they burn out. Set up your furniture so you have a clear path. Avoid moving your furniture around. If any of your floors are uneven, fix them. If there are any pets around you, be aware of where they are. Review your medicines with your doctor. Some medicines can make you feel dizzy. This can increase your chance of falling. Ask your doctor what other things that you can do to help prevent falls. This information is not intended to replace advice given to you by your health care provider. Make sure you discuss any questions you have with your health care provider. Document Released: 03/20/2009 Document Revised: 10/30/2015 Document Reviewed: 06/28/2014 Elsevier Interactive Patient Education  2017 [employer].

## 2021-03-03 NOTE — Telephone Encounter (Signed)
Done thankls

## 2021-03-03 NOTE — Telephone Encounter (Signed)
Nurses That would be fine with me I assume they are already getting oxygen? I assume they are getting this through [place]? If so it may have [facility] try to get her portable oxygen thank you

## 2021-03-03 NOTE — Telephone Encounter (Signed)
Spoke with patient and daughter at [facility] visit today. Daughter, [name], asks if an Rx for portable oxygen could be sent to [facility]? Pt is more mobile now with wheelchair and would like to go to appointments and church. Thank you!

## 2021-03-03 NOTE — Progress Notes (Addendum)
Subjective:   [name] is a 87 y.o. female who presents for an Initial Medicare Annual Wellness Visit. Virtual Visit via Telephone Note I have reviewed and agree with the above visit documentation Sallee Lange MD [facility]  I connected with  [name] on 03/03/21 at 11:00 AM EDT by telephone and verified that I am speaking with the correct person using two identifiers.  Location: Patient: Home  Provider: [facility] Persons participating in the virtual visit: patient/Nurse Health Advisor   I discussed the limitations, risks, security and privacy concerns of performing an evaluation and management service by telephone and the availability of in person appointments. The patient expressed understanding and agreed to proceed.  Interactive audio and video telecommunications were attempted between this nurse and patient, however failed, due to patient having technical difficulties OR patient did not have access to video capability.  We continued and completed visit with audio only.  Some vital signs may be absent or patient reported.   Chriss Driver, LPN  Review of Systems     Cardiac Risk Factors include: advanced age (>67mn, >>65women);hypertension;dyslipidemia;sedentary lifestyle;obesity (BMI >30kg/m2)     Objective:    Today's Vitals   03/03/21 1106  Weight: 163 lb (73.9 kg)  Height: '5\' 3"'$  (1.6 m)   Body mass index is 28.87 kg/m.  Advanced Directives 03/03/2021 12/24/2020 11/21/2020 03/17/2020 10/10/2018 05/18/2018 12/22/2017  Does Patient Have a Medical Advance Directive? No No No No No No No  Would patient like information on creating a medical advance directive? No - Patient declined No - Patient declined No - Patient declined No - Patient declined - No - Patient declined No - Patient declined  Pre-existing out of facility DNR order (yellow form or pink MOST form) - - - - - - -    Current Medications (verified) Outpatient Encounter  Medications as of 03/03/2021  Medication Sig   albuterol (VENTOLIN HFA) 108 (90 Base) MCG/ACT inhaler TAKE 2 PUFFS EVERY 6 HOURS AS NEEDED   diltiazem (TIAZAC) 360 MG 24 hr capsule TAKE ONE CAPSULE BY MOUTH ONCE DAILY.   mupirocin ointment (BACTROBAN) 2 % Apply thin amount daily to skin blister   pravastatin (PRAVACHOL) 80 MG tablet TAKE (1) TABLET BY MOUTH AT BEDTIME.   torsemide (DEMADEX) 20 MG tablet TAKE 3 TABLETS BY MOUTH DAILY   Travoprost, BAK Free, (TRAVATAN) 0.004 % SOLN ophthalmic solution Place 1 drop into both eyes at bedtime.    VITAMIN D, CHOLECALCIFEROL, PO Take 1 tablet by mouth every morning.   Vitamin D, Ergocalciferol, (DRISDOL) 1.25 MG (50000 UNIT) CAPS capsule TAKE 1 CAPSULE BY MOUTH ONCE A WEEK.   No facility-administered encounter medications on file as of 03/03/2021.    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 ;magnetic MRI->  multiple cerebral infarctions; Rx-ASA but subsequently changed to coumadin  when found to have rheumatic mitral valve disease carotid duplex mild plaque in 08/2008   Chronic bronchitis    Chronic renal insufficiency    baseline creatine 1.4; 1.04 in 03/2010   DJD (degenerative joint disease)  hands, knees   Gout    Hyperlipidemia    Hypertension    heart disease diastolic dysfunction ; [id number]; mild pulmonary edema in 2006;responded to diurectics ; LVH   Kidney stones    Mitral valve disease    Severe mitral annular calcification; mild to moderate stenosis-not clearly rheumatic   Nephrolithiasis    Pacemaker    Retinal hemorrhage    Seizure disorder (HCC)     Shortness of breath    only with exertion   Stroke ([place])    Tachycardia-bradycardia syndrome ([place])    with pauses and syncope;PPM implantation 10/2008,negative stress nuclear study 03/2005   Vertigo    Past Surgical History:  Procedure Laterality Date   CATARACT EXTRACTION W/PHACO  03/29/2011   Procedure: CATARACT EXTRACTION PHACO AND INTRAOCULAR LENS PLACEMENT ([place]);  Surgeon: Tonny Branch;  Location: [facility];  Service: Ophthalmology;  Laterality: Right;  CDE 12.62   INSERT / REPLACE / REMOVE PACEMAKER  11/2008   [employer] DUAL chamber pacemaker 11/06/2008   MEMBRANE PEEL Right 02/12/2014   Procedure: MEMBRANE PEEL;  Surgeon: [name], MD;  Location: [facility];  Service: Ophthalmology;  Laterality: Right;   PARS PLANA VITRECTOMY Right 02/12/2014   Procedure: PARS PLANA VITRECTOMY WITH 25 GAUGE;  Surgeon: [name], MD;  Location: [place];  Service: Ophthalmology;  Laterality: Right;   PPM GENERATOR CHANGEOUT N/A 03/17/2020   Procedure: PPM GENERATOR CHANGEOUT;  Surgeon: Deboraha Sprang, MD;  Location: [place] [facility];  Service: Cardiovascular;  Laterality: N/A;   TOTAL KNEE ARTHROPLASTY  04/2009   Right   Family History  Problem Relation Age of Onset   Cancer Other        unknown cancer   Hypotension Neg Hx    Anesthesia problems Neg Hx    Pseudochol deficiency Neg Hx    Malignant hyperthermia Neg Hx    Social History   Socioeconomic History   Marital status: Married    Spouse name: Administrator, arts   Number of children: Not on file   Years of education: Not on file   Highest education level: Not on file  Occupational History   Not on file  Tobacco Use   Smoking status: Never   Smokeless tobacco: Never  Vaping Use   Vaping Use: Never used  Substance and Sexual Activity   Alcohol use: No    Alcohol/week: 0.0 standard drinks   Drug use: No   Sexual activity: Not Currently  Other Topics Concern   Not on file  Social History Narrative   Lives with husband, [place].    Daughter [name], lives nearby and helps with care.   Social Determinants of Health   Financial Resource Strain: Low Risk    Difficulty of Paying Living Expenses: Not hard at all  Food Insecurity: No Food Insecurity   Worried About Charity fundraiser in the Last Year: Never true   [place] in the Last Year: Never true  Transportation Needs: No Transportation Needs   Lack of Transportation (Medical): No   Lack of Transportation (Non-Medical): No  Physical Activity: Inactive   Days of Exercise per Week: 0 days   Minutes of Exercise per Session: 0 min  Stress: No Stress Concern Present   Feeling of Stress : Not at all  Social Connections: Moderately Isolated   Frequency of Communication with Friends and Family: More than three times a week   Frequency of Social Gatherings with Friends and Family: More than  three times a week   Attends Religious Services: Never   Active Member of Clubs or Organizations: No   Attends Archivist Meetings: Never   Marital Status: Married    Tobacco Counseling Counseling given: Not Answered   Clinical Intake:  Pre-visit preparation completed: Yes  Pain : No/denies pain     BMI - recorded: 28.87 Nutritional Status: BMI 25 -29 Overweight Nutritional Risks: None Diabetes: No  How often do you need to have someone help you when you read instructions, pamphlets, or other written materials from your doctor or pharmacy?: 1 - Never  Diabetic?NO per pt and daughter. No medications and does not check blood sugars.  Interpreter Needed?: No  Information entered by :: Randal Buba, LPN   Activities of Daily Living In your present state of health, do you have any difficulty performing the following activities: 03/03/2021 11/22/2020  Hearing? N Y  Vision? N N  Difficulty concentrating or making decisions? N N  Walking or climbing stairs? Y Y  Dressing or bathing? Y Y  Doing errands, shopping? Tempie Donning  Preparing Food and eating ? Y -   Using the Toilet? Y -  In the past six months, have you accidently leaked urine? N -  Do you have problems with loss of bowel control? N -  Managing your Medications? Y -  Managing your Finances? Y -  Housekeeping or managing your Housekeeping? Y -  Some recent data might be hidden    Patient Care Team: Kathyrn Drown, MD as PCP - General (Family Medicine) Evans Lance, MD as PCP - Electrophysiology (Cardiology) Carole Civil, MD (Orthopedic Surgery) Evans Lance, MD (Cardiology)  Indicate any recent Medical Services you may have received from other than [facility] providers in the past year (date may be approximate).     Assessment:   This is a routine wellness examination for Elinora.  Hearing/Vision screen Hearing Screening - Comments:: No hearing issues.  Vision Screening - Comments:: Glasses. Behind on eye exam due to recent illness and being bed-bound.   Dietary issues and exercise activities discussed: Current Exercise Habits: The patient does not participate in regular exercise at present, Exercise limited by: cardiac condition(s)   Goals Addressed             This Visit's Progress    Have 3 meals a day       Eat a healthy diet.       Depression Screen PHQ 2/9 Scores 03/03/2021 12/24/2020 12/24/2020 10/21/2020 04/02/2020 11/08/2017 04/09/2015  PHQ - 2 Score 0 0 0 0 0 0 0    Fall Risk Fall Risk  03/03/2021 12/24/2020 10/21/2020 07/14/2020 05/05/2020  Falls in the past year? 0 0 0 0 0  Number falls in past yr: 0 - 0 - -  Injury with Fall? 0 - 0 - -  Risk for fall due to : Impaired mobility;Impaired balance/gait;Impaired vision - - - Impaired balance/gait;Impaired mobility  Follow up Falls prevention discussed - Falls evaluation completed Falls evaluation completed Falls evaluation completed;Education provided    FALL RISK PREVENTION PERTAINING TO THE HOME:  Any stairs in or around the home? No  If so, are there any without handrails? No  Home free of loose  throw rugs in walkways, pet beds, electrical cords, etc? Yes  Adequate lighting in your home to reduce risk of falls? Yes   ASSISTIVE DEVICES UTILIZED TO PREVENT FALLS:  Life alert? No  Use of a cane, walker  or w/c? Yes  Grab bars in the bathroom? Yes  Shower chair or bench in shower? Yes  Elevated toilet seat or a handicapped toilet? Yes   TIMED UP AND GO:  Was the test performed? No . Phone visit.  Cognitive Function:     6CIT Screen 03/03/2021  What Year? 0 points  What month? 0 points  What time? 0 points  Count back from 20 0 points  Months in reverse 0 points  Repeat phrase 2 points  Total Score 2    Immunizations Immunization History  Administered Date(s) Administered   Fluad Quad(high Dose 65+) 04/04/2020   Influenza, High Dose Seasonal PF 04/27/2019   Influenza,inj,Quad PF,6+ Mos 04/01/2014, 04/09/2015, 04/05/2016, 03/15/2017, 04/11/2018   Influenza-Unspecified 03/08/2019   Pneumococcal Conjugate-13 04/09/2015   Pneumococcal Polysaccharide-23 11/18/2016    TDAP status: Due, Education has been provided regarding the importance of this vaccine. Advised may receive this vaccine at local pharmacy or Health Dept. Aware to provide a copy of the vaccination record if obtained from local pharmacy or Health Dept. Verbalized acceptance and understanding.  Flu Vaccine status: Due, Education has been provided regarding the importance of this vaccine. Advised may receive this vaccine at local pharmacy or Health Dept. Aware to provide a copy of the vaccination record if obtained from local pharmacy or Health Dept. Verbalized acceptance and understanding.  Pneumococcal vaccine status: Up to date  Covid-19 vaccine status: Information provided on how to obtain vaccines.   Qualifies for Shingles Vaccine? Yes   Zostavax completed No   Shingrix Completed?: No.    Education has been provided regarding the importance of this vaccine. Patient has been advised to call insurance  company to determine out of pocket expense if they have not yet received this vaccine. Advised may also receive vaccine at local pharmacy or Health Dept. Verbalized acceptance and understanding.  Screening Tests Health Maintenance  Topic Date Due   OPHTHALMOLOGY EXAM  Never done   TETANUS/TDAP  Never done   Zoster Vaccines- Shingrix (1 of 2) Never done   DEXA SCAN  Never done   URINE MICROALBUMIN  11/22/2017   FOOT EXAM  05/25/2019   HEMOGLOBIN A1C  10/01/2020   INFLUENZA VACCINE  01/05/2021   HPV VACCINES  Aged Out   COVID-19 Vaccine  Discontinued    Health Maintenance  Health Maintenance Due  Topic Date Due   OPHTHALMOLOGY EXAM  Never done   TETANUS/TDAP  Never done   Zoster Vaccines- Shingrix (1 of 2) Never done   DEXA SCAN  Never done   URINE MICROALBUMIN  11/22/2017   FOOT EXAM  05/25/2019   HEMOGLOBIN A1C  10/01/2020   INFLUENZA VACCINE  01/05/2021    Colorectal cancer screening: No longer required.   Mammogram status: No longer required due to age.  Bone Density status: Ordered UNABLE TO DO, DUE TO MOBILITY ISSUES. Pt provided with contact info and advised to call to schedule appt.  Lung Cancer Screening: (Low Dose CT Chest recommended if Age 41-80 years, 30 pack-year currently smoking OR have quit w/in 15years.) does not qualify.    Additional Screening:  Hepatitis C Screening: does not qualify.  Vision Screening: Recommended annual ophthalmology exams for early detection of glaucoma and other disorders of the eye. Is the patient up to date with their annual eye exam?  No  Who is the provider or what is the name of the office in which the patient attends annual eye exams? [facility]. Unable to attend due to  mobility issues. If pt is not established with a provider, would they like to be referred to a provider to establish care? No .   Dental Screening: Recommended annual dental exams for proper oral hygiene  Community Resource Referral / Chronic Care  Management: CRR required this visit?  No   CCM required this visit?  No      Plan:     I have personally reviewed and noted the following in the patient's chart:   Medical and social history Use of alcohol, tobacco or illicit drugs  Current medications and supplements including opioid prescriptions. Patient is not currently taking opioid prescriptions. Functional ability and status Nutritional status Physical activity Advanced directives List of other physicians Hospitalizations, surgeries, and ER visits in previous 12 months Vitals Screenings to include cognitive, depression, and falls Referrals and appointments  In addition, I have reviewed and discussed with patient certain preventive protocols, quality metrics, and best practice recommendations. A written personalized care plan for preventive services as well as general preventive health recommendations were provided to patient.     Chriss Driver, LPN   [id number]   Nurse Notes: Pt states she is doing well. Phone visit done along with daughter, [name]. Pt currently under palliative care. Pt is now able to get out of bed and into wheelchair with assistance. Discussed flu and shingles vaccines, daughter [name] states she will get those scheduled. Also discussed getting Rx for portable O2 so pt can go to appointments and church. Message sent to Dr. Wolfgang Phoenix.

## 2021-03-04 ENCOUNTER — Telehealth: Payer: Self-pay | Admitting: Family Medicine

## 2021-03-04 NOTE — Telephone Encounter (Signed)
Mariann Laster from [facility] left message that patient insurance would not cover portable oxygen. Please advise [phone]

## 2021-03-05 NOTE — Telephone Encounter (Signed)
[facility] contacted, receptionist will send message to pt Hospice nurse and she will call back.

## 2021-03-05 NOTE — Telephone Encounter (Signed)
Please connect with hospice Please let them know that the patient would like to have portable oxygen in order to be able to leave the house at times I am fine with this but explained to them that it has to come from them if we need to cosign it that would be fine thank you

## 2021-03-05 NOTE — Telephone Encounter (Signed)
Hospice returned call and pt has oxygen tank at this time.

## 2021-03-07 DIAGNOSIS — R531 Weakness: Secondary | ICD-10-CM | POA: Diagnosis not present

## 2021-03-07 DIAGNOSIS — R270 Ataxia, unspecified: Secondary | ICD-10-CM | POA: Diagnosis not present

## 2021-03-17 ENCOUNTER — Ambulatory Visit (INDEPENDENT_AMBULATORY_CARE_PROVIDER_SITE_OTHER): Payer: Medicare Other

## 2021-03-17 DIAGNOSIS — I495 Sick sinus syndrome: Secondary | ICD-10-CM

## 2021-03-17 LAB — CUP PACEART REMOTE DEVICE CHECK
Battery Remaining Longevity: 122 mo
Battery Remaining Percentage: 95 %
Battery Voltage: 3.02 V
Brady Statistic RV Percent Paced: 24 %
Date Time Interrogation Session: 20221011044535
Implantable Lead Implant Date: 20100602
Implantable Lead Implant Date: 20100602
Implantable Lead Location: 753859
Implantable Lead Location: 753860
Implantable Pulse Generator Implant Date: 20211011
Lead Channel Impedance Value: 400 Ohm
Lead Channel Pacing Threshold Amplitude: 1 V
Lead Channel Pacing Threshold Pulse Width: 0.4 ms
Lead Channel Sensing Intrinsic Amplitude: 12 mV
Lead Channel Setting Pacing Amplitude: 2.5 V
Lead Channel Setting Pacing Pulse Width: 0.4 ms
Lead Channel Setting Sensing Sensitivity: 2 mV
Pulse Gen Model: 2272
Pulse Gen Serial Number: 6220844

## 2021-03-23 ENCOUNTER — Telehealth: Payer: Self-pay | Admitting: Family Medicine

## 2021-03-23 NOTE — Telephone Encounter (Signed)
May have 6 months on all of those medicines please

## 2021-03-23 NOTE — Telephone Encounter (Signed)
Amlodipine not on current med list. Per epic; d/c 11/25/20 please advise. Thank you

## 2021-03-23 NOTE — Telephone Encounter (Signed)
Please advise. Thank you

## 2021-03-23 NOTE — Telephone Encounter (Signed)
[name] at [facility] called to get refill on meds for patient.   Diltiazem Amlodipine Torsemide  [facility]  [phone]

## 2021-03-24 ENCOUNTER — Other Ambulatory Visit: Payer: Self-pay | Admitting: Family Medicine

## 2021-03-24 MED ORDER — DILTIAZEM HCL ER BEADS 360 MG PO CP24: 360.0000 mg | ORAL_CAPSULE | Freq: Every day | ORAL | 1 refills | Status: DC

## 2021-03-24 MED ORDER — TORSEMIDE 20 MG PO TABS: ORAL_TABLET | ORAL | 1 refills | Status: DC

## 2021-03-24 NOTE — Telephone Encounter (Signed)
She should not be on diltiazem and amlodipine.  I recommend stopping amlodipine may continue diltiazem.  May send [place] blood pressure readings periodically.

## 2021-03-24 NOTE — Telephone Encounter (Signed)
Diltiazem and Toresemide sent to [facility]. Left message for Santiago Glad to return call

## 2021-03-25 NOTE — Telephone Encounter (Signed)
Hospice nurse Santiago Glad returned call and verbalized understanding.

## 2021-03-25 NOTE — Progress Notes (Signed)
Remote pacemaker transmission.   

## 2021-03-27 ENCOUNTER — Telehealth: Payer: Self-pay | Admitting: Family Medicine

## 2021-03-27 NOTE — Telephone Encounter (Signed)
Ms. Maisie Fus stated patient is unable to travel and needs her flu shot. Asked if there was somehow someone could come to her. Please advise.   [phone]

## 2021-03-27 NOTE — Telephone Encounter (Signed)
Left message to return call 

## 2021-03-27 NOTE — Telephone Encounter (Signed)
Ms. Maisie Fus called and stated patient cannot tolerate Miralax and inquired about trying Linzess. Stated the Elesa Hacker made her sick.   [phone]

## 2021-03-27 NOTE — Telephone Encounter (Signed)
Left message to return call (Will hospice administer)

## 2021-04-01 NOTE — Telephone Encounter (Signed)
Call was received from patient's daughter stating patient was experiencing nausea with miralax , she stopped that and is now taking milk of magnesia as needed. Daughter requests a linzess prescription. There is a request for a flu vaccine, is it possible to have that done either through [facility] or home visit .please advise.

## 2021-04-01 NOTE — Telephone Encounter (Signed)
Please let me speak with hospice nurse thank you regarding the family's request for Linzess

## 2021-04-03 NOTE — Telephone Encounter (Signed)
Farrell Santiago Glad. She will be seeing patient on Monday. Santiago Glad states that pt has always had good bowel sounds. Santiago Glad will speak with patient and family and can call the office on Monday to discuss with PCP. Santiago Glad states she can also administer flu shot.

## 2021-04-03 NOTE — Telephone Encounter (Signed)
Left message to return call- Hospice Nurse Santiago Glad

## 2021-04-03 NOTE — Telephone Encounter (Signed)
Please tell hospice nurse-may give flu shot, I do not recommend Linzess, I would recommend other measures to help with bowel movements if possible

## 2021-04-06 NOTE — Telephone Encounter (Signed)
Pt daughter contacted. Daughter states Miralax makes pt queasy and sick feeling. Advise daughter regarding Senokot and Fleet Enemas. Pt daughter verbalized understanding. Daughter states that hospice nurse is going to bring out flu shot and administer to patient when she come out. Script for flu shot written out to be faxed to [facility].

## 2021-04-06 NOTE — Telephone Encounter (Signed)
Hospice nurse notified and stated please send in a script for constipation to [facility](whatever is advised). Patient currently using Miralax and still having hard bowel movements and constipated. Patient also needs script for flu shot sent to [facility] for her to administer

## 2021-04-06 NOTE — Telephone Encounter (Signed)
So in this situation I would recommend the following MiraLAX 1 capful in 16 ounces twice daily Senokot orally daily Fleets enema once daily if no bowel movement over the past 3 to 4 days Feel free to write this out on letterhead I will sign it and then we can send in to them via fax

## 2021-04-07 DIAGNOSIS — R270 Ataxia, unspecified: Secondary | ICD-10-CM | POA: Diagnosis not present

## 2021-04-07 DIAGNOSIS — R531 Weakness: Secondary | ICD-10-CM | POA: Diagnosis not present

## 2021-04-08 NOTE — Progress Notes (Deleted)
Referring Provider: Kathyrn Drown, MD Primary Care Physician:  Kathyrn Drown, MD Primary GI Physician: Dr. Abbey Chatters  No chief complaint on file.   HPI:   [name] is a 87 y.o. female with history of atrial fibrillation on warfarin, HTN, HLD, stroke, sick sinus syndrome, CKD stage IV, diastolic CHF, DM type II, presenting today for hospital follow-up constipation, anemia, heme positive stool, black stool, anorexia, and weight loss.   Patient was admitted in June 2022 with acute on chronic diastolic CHF with increased O2 requirements, constipation failing multiple over-the-counter agents, nausea, melanotic stool, and reported 20 pound weight loss over the last 4-6 months due to decreased appetite. GI was consulted for further evaluation.  She was found to have stool ball on abdominal x-ray and required disimpaction.  Started on MiraLAX twice daily with significant improvement in constipation.  Regarding melena, stool was heme positive and reportedly black, but patient was also on iron, likely contributing.  Hemoglobin fairly stable at 11.9.  Slow decline during admission down to hemoglobin of 10.9, continued with black stools.  She was started on PPI twice daily, and had planned on EGD during admission for melena as well as reported anorexia and weight loss, but warfarin would need to be held x5 days and patient declined further hospital stay.   Hemoglobin 10.3 on 7/8.  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 ;magnetic MRI->  multiple cerebral infarctions; Rx-ASA but subsequently changed to coumadin  when found to have rheumatic  mitral valve disease carotid duplex mild plaque in 08/2008   Chronic bronchitis    Chronic renal insufficiency    baseline creatine 1.4; 1.04 in 03/2010   DJD (degenerative joint disease)    hands, knees   Gout    Hyperlipidemia    Hypertension    heart disease diastolic dysfunction ; QQ-76%; mild pulmonary edema in 2006;responded to diurectics ; LVH   Kidney stones    Mitral valve disease    Severe mitral annular calcification; mild to moderate stenosis-not clearly rheumatic   Nephrolithiasis    Pacemaker    Retinal hemorrhage    Seizure disorder (HCC)    Shortness of breath    only with exertion   Stroke ([place])    Tachycardia-bradycardia syndrome ([place])    with pauses and syncope;PPM implantation 10/2008,negative stress nuclear study 03/2005   Vertigo     Past Surgical History:  Procedure Laterality Date   CATARACT EXTRACTION W/PHACO  03/29/2011   Procedure: CATARACT EXTRACTION PHACO AND INTRAOCULAR LENS PLACEMENT ([place]);  Surgeon: Tonny Branch;  Location: [facility];  Service: Ophthalmology;  Laterality: Right;  CDE 12.62   INSERT / REPLACE / REMOVE PACEMAKER  11/2008   St.Jude DUAL chamber pacemaker 11/06/2008   MEMBRANE PEEL Right 02/12/2014   Procedure: MEMBRANE PEEL;  Surgeon: [name], MD;  Location: [place];  Service: Ophthalmology;  Laterality: Right;   PARS PLANA VITRECTOMY Right 02/12/2014   Procedure: PARS PLANA VITRECTOMY WITH 25 GAUGE;  Surgeon: [name], MD;  Location: [place];  Service: Ophthalmology;  Laterality: Right;   PPM  GENERATOR CHANGEOUT N/A 03/17/2020   Procedure: PPM GENERATOR CHANGEOUT;  Surgeon: Deboraha Sprang, MD;  Location: [place] [facility];  Service: Cardiovascular;  Laterality: N/A;   TOTAL KNEE ARTHROPLASTY  04/2009   Right    Current Outpatient Medications  Medication Sig Dispense Refill   albuterol (VENTOLIN HFA) 108 (90 Base) MCG/ACT inhaler TAKE 2 PUFFS EVERY 6 HOURS AS NEEDED 18 g 0   diltiazem (TIAZAC) 360 MG 24 hr capsule Take 1 capsule  (360 mg total) by mouth daily. 90 capsule 1   mupirocin ointment (BACTROBAN) 2 % Apply thin amount daily to skin blister 22 g 2   pravastatin (PRAVACHOL) 80 MG tablet TAKE (1) TABLET BY MOUTH AT BEDTIME. 90 tablet 0   torsemide (DEMADEX) 20 MG tablet TAKE 3 TABLETS BY MOUTH DAILY 270 tablet 1   Travoprost, BAK Free, (TRAVATAN) 0.004 % SOLN ophthalmic solution Place 1 drop into both eyes at bedtime.      VITAMIN D, CHOLECALCIFEROL, PO Take 1 tablet by mouth every morning.     Vitamin D, Ergocalciferol, (DRISDOL) 1.25 MG (50000 UNIT) CAPS capsule TAKE 1 CAPSULE BY MOUTH ONCE A WEEK. 4 capsule 0   No current facility-administered medications for this visit.    Allergies as of 04/09/2021 - Review Complete 03/03/2021  Allergen Reaction Noted   Tramadol  04/14/2017    Family History  Problem Relation Age of Onset   Cancer Other        unknown cancer   Hypotension Neg Hx    Anesthesia problems Neg Hx    Pseudochol deficiency Neg Hx    Malignant hyperthermia Neg Hx     Social History   Socioeconomic History   Marital status: Married    Spouse name: Administrator, arts   Number of children: Not on file   Years of education: Not on file   Highest education level: Not on file  Occupational History   Not on file  Tobacco Use   Smoking status: Never   Smokeless tobacco: Never  Vaping Use   Vaping Use: Never used  Substance and Sexual Activity   Alcohol use: No    Alcohol/week: 0.0 standard drinks   Drug use: No   Sexual activity: Not Currently  Other Topics Concern   Not on file  Social History Narrative   Lives with husband, [name].   Daughter [name], lives nearby and helps with care.   Social Determinants of Health   Financial Resource Strain: Low Risk    Difficulty of Paying Living Expenses: Not hard at all  Food Insecurity: No Food Insecurity   Worried About Charity fundraiser in the Last Year: Never true   [place] in the Last Year: Never true  Transportation Needs: No  Transportation Needs   Lack of Transportation (Medical): No   Lack of Transportation (Non-Medical): No  Physical Activity: Inactive   Days of Exercise per Week: 0 days   Minutes of Exercise per Session: 0 min  Stress: No Stress Concern Present   Feeling of Stress : Not at all  Social Connections: Moderately Isolated   Frequency of Communication with Friends and Family: More than three times a week   Frequency of Social Gatherings with Friends and Family: More than three times a week   Attends Religious Services: Never   Marine scientist or Organizations: No   Attends Archivist Meetings: Never   Marital Status: Married    Review of Systems: Gen: Denies  fever, chills, anorexia. Denies fatigue, weakness, weight loss.  CV: Denies chest pain, palpitations, syncope, peripheral edema, and claudication. Resp: Denies dyspnea at rest, cough, wheezing, coughing up blood, and pleurisy. GI: Denies vomiting blood, jaundice, and fecal incontinence.   Denies dysphagia or odynophagia. Derm: Denies rash, itching, dry skin Psych: Denies depression, anxiety, memory loss, confusion. No homicidal or suicidal ideation.  Heme: Denies bruising, bleeding, and enlarged lymph nodes.  Physical Exam: There were no vitals taken for this visit. General:   Alert and oriented. No distress noted. Pleasant and cooperative.  Head:  Normocephalic and atraumatic. Eyes:  Conjuctiva clear without scleral icterus. Mouth:  Oral mucosa pink and moist. Good dentition. No lesions. Heart:  S1, S2 present without murmurs appreciated. Lungs:  Clear to auscultation bilaterally. No wheezes, rales, or rhonchi. No distress.  Abdomen:  +BS, soft, non-tender and non-distended. No rebound or guarding. No HSM or masses noted. Msk:  Symmetrical without gross deformities. Normal posture. Extremities:  Without edema. Neurologic:  Alert and  oriented x4 Psych:  Alert and cooperative. Normal mood and affect.

## 2021-04-09 ENCOUNTER — Encounter: Payer: Self-pay | Admitting: Gastroenterology

## 2021-04-09 ENCOUNTER — Ambulatory Visit: Payer: Medicare HMO | Admitting: Gastroenterology

## 2021-06-16 ENCOUNTER — Ambulatory Visit (INDEPENDENT_AMBULATORY_CARE_PROVIDER_SITE_OTHER)

## 2021-06-16 DIAGNOSIS — I495 Sick sinus syndrome: Secondary | ICD-10-CM | POA: Diagnosis not present

## 2021-06-16 LAB — CUP PACEART REMOTE DEVICE CHECK
Battery Remaining Longevity: 119 mo
Battery Remaining Percentage: 93 %
Battery Voltage: 3.02 V
Brady Statistic RV Percent Paced: 29 %
Date Time Interrogation Session: 20230110043225
Implantable Lead Implant Date: 20100602
Implantable Lead Implant Date: 20100602
Implantable Lead Location: 753859
Implantable Lead Location: 753860
Implantable Pulse Generator Implant Date: 20211011
Lead Channel Impedance Value: 400 Ohm
Lead Channel Pacing Threshold Amplitude: 1 V
Lead Channel Pacing Threshold Pulse Width: 0.4 ms
Lead Channel Sensing Intrinsic Amplitude: 12 mV
Lead Channel Setting Pacing Amplitude: 2.5 V
Lead Channel Setting Pacing Pulse Width: 0.4 ms
Lead Channel Setting Sensing Sensitivity: 2 mV
Pulse Gen Model: 2272
Pulse Gen Serial Number: 6220844

## 2021-06-18 ENCOUNTER — Encounter (INDEPENDENT_AMBULATORY_CARE_PROVIDER_SITE_OTHER): Payer: Medicare HMO | Admitting: Ophthalmology

## 2021-06-25 DIAGNOSIS — M19071 Primary osteoarthritis, right ankle and foot: Secondary | ICD-10-CM | POA: Diagnosis not present

## 2021-06-25 DIAGNOSIS — M19072 Primary osteoarthritis, left ankle and foot: Secondary | ICD-10-CM | POA: Diagnosis not present

## 2021-06-25 DIAGNOSIS — I70209 Unspecified atherosclerosis of native arteries of extremities, unspecified extremity: Secondary | ICD-10-CM | POA: Diagnosis not present

## 2021-06-25 DIAGNOSIS — B351 Tinea unguium: Secondary | ICD-10-CM | POA: Diagnosis not present

## 2021-06-25 DIAGNOSIS — I739 Peripheral vascular disease, unspecified: Secondary | ICD-10-CM | POA: Diagnosis not present

## 2021-06-25 NOTE — Progress Notes (Signed)
Remote pacemaker transmission.   

## 2021-07-13 ENCOUNTER — Telehealth: Payer: Self-pay

## 2021-07-13 ENCOUNTER — Other Ambulatory Visit: Payer: Self-pay

## 2021-07-13 MED ORDER — PREDNISONE 20 MG PO TABS: 20.0000 mg | ORAL_TABLET | Freq: Two times a day (BID) | ORAL | 0 refills | Status: AC

## 2021-07-13 MED ORDER — CEPHALEXIN 500 MG PO CAPS: 500.0000 mg | ORAL_CAPSULE | Freq: Three times a day (TID) | ORAL | 0 refills | Status: AC

## 2021-07-13 NOTE — Telephone Encounter (Signed)
Patient informed of md message and instructions. Verbalized understanding

## 2021-07-13 NOTE — Telephone Encounter (Signed)
[name] with [facility] [employer] [phone]- came out to see her this morning , pinky on right hand swollen and painful started last night, no other concerns  Hx of gout - please advise

## 2021-07-13 NOTE — Progress Notes (Signed)
Message left for a return call to inform patient of medication orders sent to the pharmacy per drs orders.

## 2021-07-13 NOTE — Telephone Encounter (Signed)
Recommend prednisone 20 mg, 2 daily for 5 days Also recommend Keflex 500 mg 1 taken 3 times daily for 5 days

## 2021-08-03 ENCOUNTER — Other Ambulatory Visit: Payer: Self-pay | Admitting: *Deleted

## 2021-08-03 MED ORDER — DILTIAZEM HCL ER BEADS 360 MG PO CP24: 360.0000 mg | ORAL_CAPSULE | Freq: Every day | ORAL | 2 refills | Status: DC

## 2021-08-03 MED ORDER — TORSEMIDE 20 MG PO TABS: ORAL_TABLET | ORAL | 2 refills | Status: DC

## 2021-08-25 ENCOUNTER — Telehealth: Payer: Self-pay | Admitting: *Deleted

## 2021-08-25 MED ORDER — PREDNISONE 20 MG PO TABS: ORAL_TABLET | ORAL | 0 refills | Status: DC

## 2021-08-25 NOTE — Telephone Encounter (Signed)
I would recommend prednisone 20 mg, 2 pills daily for the next 5 days ?May also use Tylenol as needed ?

## 2021-08-25 NOTE — Telephone Encounter (Signed)
Hospice called and stated patient has flare of gout in left foot and requesting medication to help ? ? ? ?[place] ?

## 2021-08-25 NOTE — Telephone Encounter (Signed)
Prescription sent electronically to pharmacy. Daughter [name](DPR) notified. ?

## 2021-09-15 ENCOUNTER — Ambulatory Visit (INDEPENDENT_AMBULATORY_CARE_PROVIDER_SITE_OTHER): Payer: Medicare HMO

## 2021-09-15 DIAGNOSIS — I495 Sick sinus syndrome: Secondary | ICD-10-CM | POA: Diagnosis not present

## 2021-09-15 LAB — CUP PACEART REMOTE DEVICE CHECK
Battery Remaining Longevity: 117 mo
Battery Remaining Percentage: 91 %
Battery Voltage: 3.02 V
Brady Statistic RV Percent Paced: 32 %
Date Time Interrogation Session: 20230411041447
Implantable Lead Implant Date: 20100602
Implantable Lead Implant Date: 20100602
Implantable Lead Location: 753859
Implantable Lead Location: 753860
Implantable Pulse Generator Implant Date: 20211011
Lead Channel Impedance Value: 400 Ohm
Lead Channel Pacing Threshold Amplitude: 1 V
Lead Channel Pacing Threshold Pulse Width: 0.4 ms
Lead Channel Sensing Intrinsic Amplitude: 12 mV
Lead Channel Setting Pacing Amplitude: 2.5 V
Lead Channel Setting Pacing Pulse Width: 0.4 ms
Lead Channel Setting Sensing Sensitivity: 2 mV
Pulse Gen Model: 2272
Pulse Gen Serial Number: 6220844

## 2021-09-28 DIAGNOSIS — I70209 Unspecified atherosclerosis of native arteries of extremities, unspecified extremity: Secondary | ICD-10-CM | POA: Diagnosis not present

## 2021-09-28 DIAGNOSIS — I739 Peripheral vascular disease, unspecified: Secondary | ICD-10-CM | POA: Diagnosis not present

## 2021-09-28 DIAGNOSIS — M19071 Primary osteoarthritis, right ankle and foot: Secondary | ICD-10-CM | POA: Diagnosis not present

## 2021-09-28 DIAGNOSIS — M19072 Primary osteoarthritis, left ankle and foot: Secondary | ICD-10-CM | POA: Diagnosis not present

## 2021-09-28 DIAGNOSIS — B351 Tinea unguium: Secondary | ICD-10-CM | POA: Diagnosis not present

## 2021-10-01 NOTE — Progress Notes (Signed)
Remote pacemaker transmission.   

## 2021-10-02 ENCOUNTER — Encounter: Payer: Self-pay | Admitting: Internal Medicine

## 2021-10-02 ENCOUNTER — Encounter (INDEPENDENT_AMBULATORY_CARE_PROVIDER_SITE_OTHER): Payer: Medicare HMO | Admitting: Ophthalmology

## 2021-10-19 ENCOUNTER — Encounter (INDEPENDENT_AMBULATORY_CARE_PROVIDER_SITE_OTHER): Payer: Medicare HMO | Admitting: Ophthalmology

## 2021-10-19 DIAGNOSIS — H3509 Other intraretinal microvascular abnormalities: Secondary | ICD-10-CM

## 2021-10-19 DIAGNOSIS — I10 Essential (primary) hypertension: Secondary | ICD-10-CM

## 2021-10-19 DIAGNOSIS — H35031 Hypertensive retinopathy, right eye: Secondary | ICD-10-CM

## 2021-10-20 DIAGNOSIS — I5020 Unspecified systolic (congestive) heart failure: Secondary | ICD-10-CM | POA: Insufficient documentation

## 2021-10-20 DIAGNOSIS — R001 Bradycardia, unspecified: Secondary | ICD-10-CM | POA: Insufficient documentation

## 2021-10-28 ENCOUNTER — Encounter: Payer: Self-pay | Admitting: Internal Medicine

## 2021-10-28 ENCOUNTER — Encounter: Payer: Medicare HMO | Admitting: Internal Medicine

## 2021-10-28 DIAGNOSIS — R001 Bradycardia, unspecified: Secondary | ICD-10-CM

## 2021-10-28 DIAGNOSIS — I495 Sick sinus syndrome: Secondary | ICD-10-CM

## 2021-10-28 DIAGNOSIS — I4811 Longstanding persistent atrial fibrillation: Secondary | ICD-10-CM

## 2021-10-28 DIAGNOSIS — I5020 Unspecified systolic (congestive) heart failure: Secondary | ICD-10-CM

## 2021-10-28 DIAGNOSIS — Z950 Presence of cardiac pacemaker: Secondary | ICD-10-CM

## 2021-11-16 ENCOUNTER — Encounter (INDEPENDENT_AMBULATORY_CARE_PROVIDER_SITE_OTHER): Payer: Medicare HMO | Admitting: Ophthalmology

## 2021-11-16 DIAGNOSIS — H3561 Retinal hemorrhage, right eye: Secondary | ICD-10-CM | POA: Diagnosis not present

## 2021-11-16 DIAGNOSIS — I10 Essential (primary) hypertension: Secondary | ICD-10-CM

## 2021-11-16 DIAGNOSIS — H3509 Other intraretinal microvascular abnormalities: Secondary | ICD-10-CM | POA: Diagnosis not present

## 2021-11-16 DIAGNOSIS — H35031 Hypertensive retinopathy, right eye: Secondary | ICD-10-CM | POA: Diagnosis not present

## 2021-11-20 IMAGING — DX DG ABDOMEN ACUTE W/ 1V CHEST
3 series · 3 of 3 positions shown · non-contrast
Comparison: Chest radiograph 06/05/2020, abdominal CT 10/10/2018

CLINICAL DATA: Constipation.

EXAM:
DG ABDOMEN ACUTE WITH 1 VIEW CHEST

[abdomen erect]
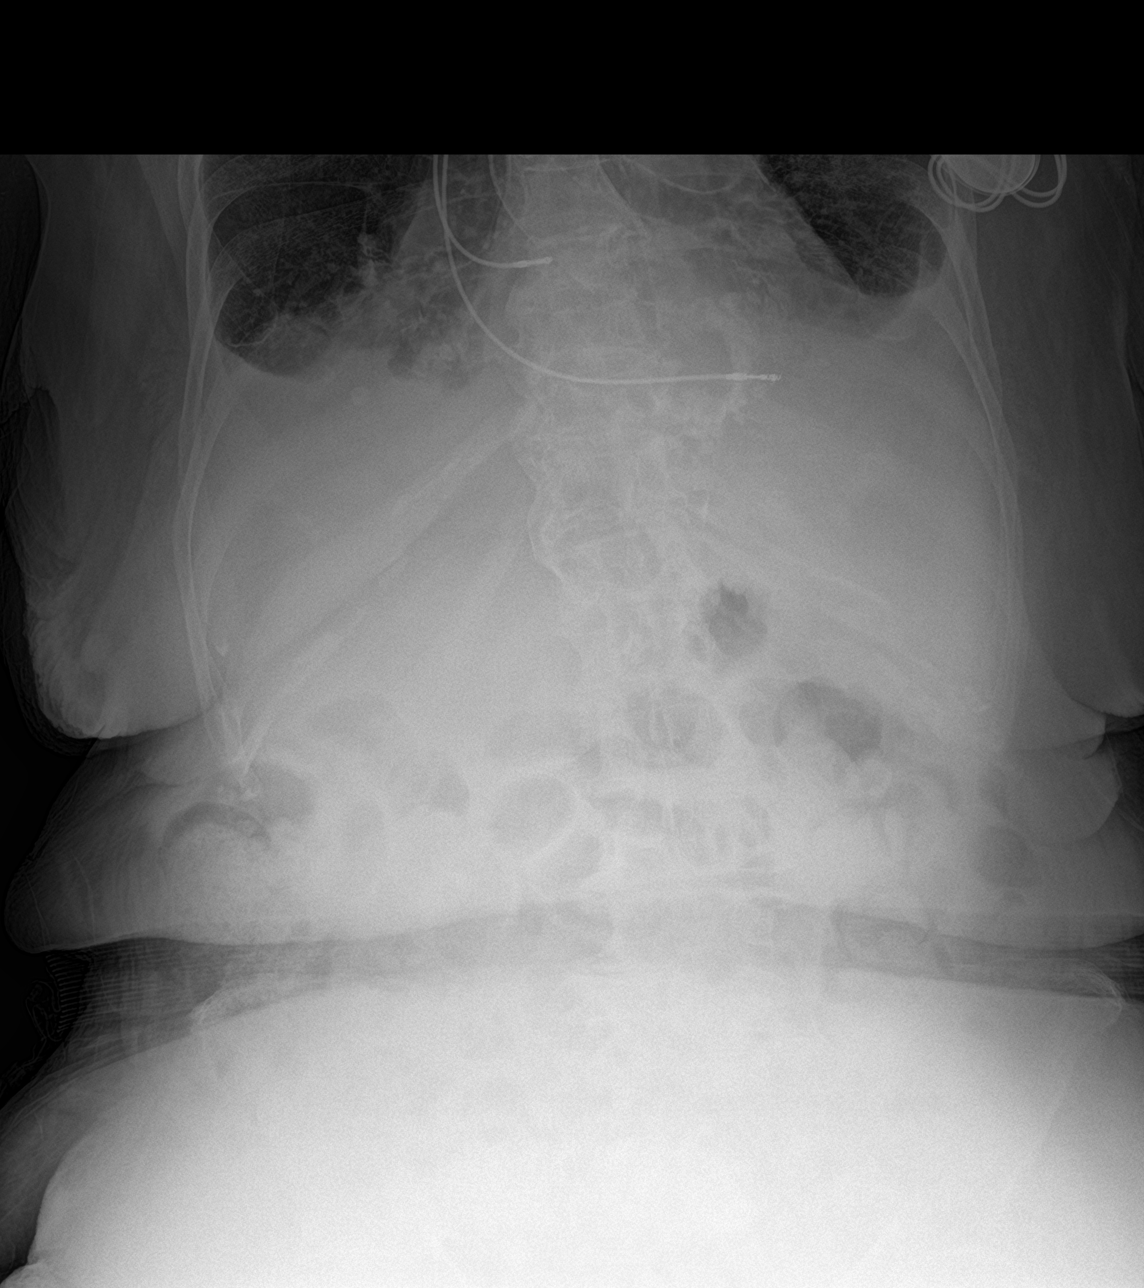

[abdomen supine]
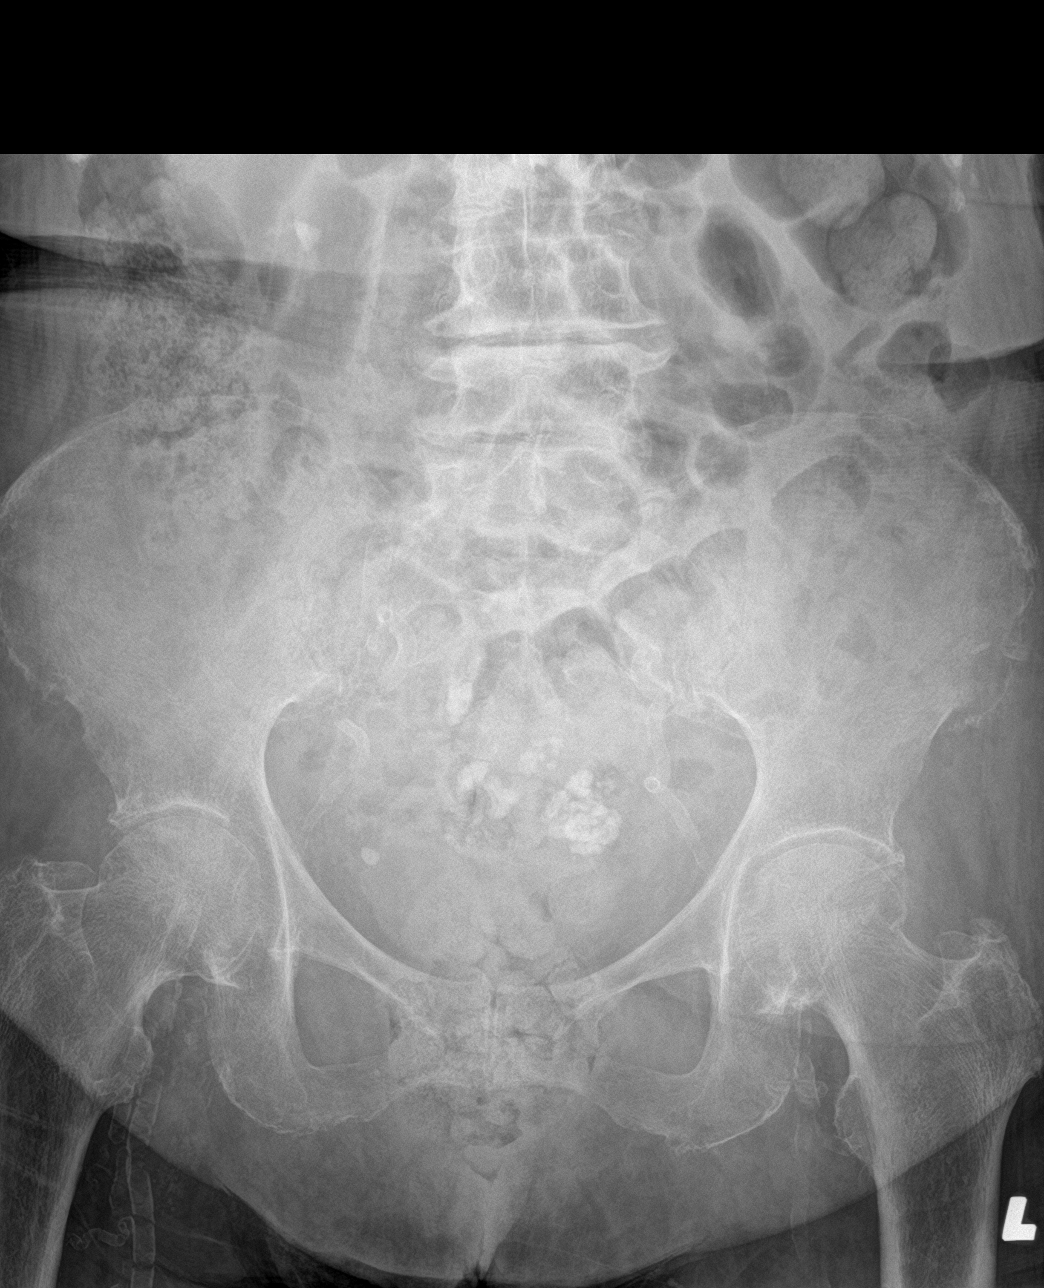

[chest ap]
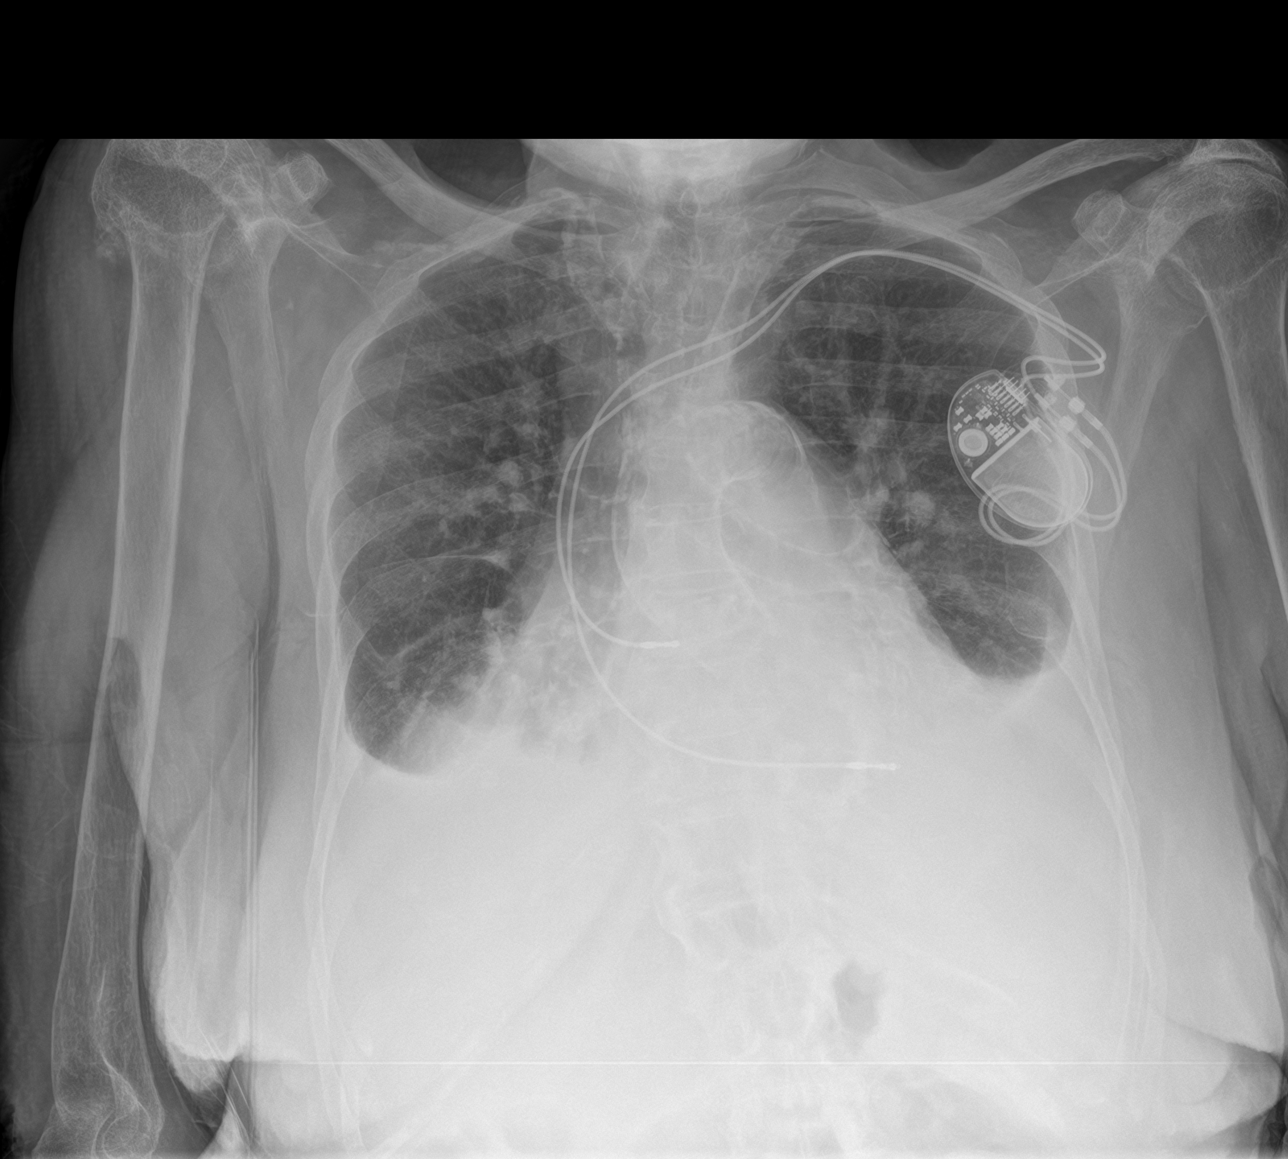

[3 of 3 positions shown; findings below may reference images not displayed]

FINDINGS: Dual lead left-sided pacemaker in place. Stable cardiomegaly. Aortic
atherosclerosis and tortuosity. Bilateral pleural effusions with
improvement on the left but worsening on the right from prior exam.
Associated bibasilar volume loss/atelectasis. Pulmonary vascular
congestion, also seen on prior. Minimal fluid tracks into the right
minor fissure. Bilateral shoulder arthropathy.

No bowel dilatation to suggest obstruction. No free intra-abdominal
air. Moderate stool in the ascending, transverse, proximal
descending colon. Stool balls distending the rectum, mild rectal
distention of 6.5 cm. Popcorn calcifications in the pelvis
correspond to calcified fibroids. Calcifications to the right of
L2-L3 correspond to right renal parenchymal calcification. Prominent
vascular calcifications. No acute osseous abnormalities are seen.
IMPRESSION: 1. Bilateral pleural effusions with improvement on the left and
worsening on the right from May 2020 radiograph.
2. Cardiomegaly with vascular congestion.
3. Moderate stool burden, with stool ball distending the rectum,
suggesting constipation and possible fecal impaction. Mild rectal
distention of 6.5 cm. No evidence of bowel obstruction or free air.

## 2021-12-03 ENCOUNTER — Ambulatory Visit: Payer: Medicare HMO | Admitting: Internal Medicine

## 2021-12-03 ENCOUNTER — Encounter: Payer: Self-pay | Admitting: Internal Medicine

## 2021-12-03 VITALS — BP 112/74 | HR 60 | Ht 63.0 in

## 2021-12-03 DIAGNOSIS — I4811 Longstanding persistent atrial fibrillation: Secondary | ICD-10-CM | POA: Diagnosis not present

## 2021-12-03 DIAGNOSIS — Z950 Presence of cardiac pacemaker: Secondary | ICD-10-CM | POA: Diagnosis not present

## 2021-12-03 DIAGNOSIS — I4819 Other persistent atrial fibrillation: Secondary | ICD-10-CM | POA: Insufficient documentation

## 2021-12-03 DIAGNOSIS — I5020 Unspecified systolic (congestive) heart failure: Secondary | ICD-10-CM

## 2021-12-03 DIAGNOSIS — R001 Bradycardia, unspecified: Secondary | ICD-10-CM | POA: Diagnosis not present

## 2021-12-03 NOTE — Progress Notes (Signed)
Patient Care Team: Meghan Drown, MD as PCP - General (Family Medicine) Meghan Lance, MD as PCP - Electrophysiology (Cardiology) Meghan Civil, MD (Orthopedic Surgery) Meghan Lance, MD (Cardiology)   HPI  [name] is a 88 y.o. female seen in follow-up for [facility] pacemaker implanted 2010 for tachybradycardia syndrome, now permanent atrial fibrillation. Generator replacement 10/21.  On diltiazem for rate and blood pressure control Atrial fibrillation, presumed permanent and is anticoagulated with warfarin  Cardiorenal issues.  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   Ultrasound 12/29/2018 demonstrates cyst no further work-up recommended by criteria   Blood transfusion    Cerebrovascular accident [facility]) 2006   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 ;magnetic MRI->  multiple cerebral infarctions; Rx-ASA but subsequently changed to coumadin  when found to have rheumatic mitral valve disease carotid duplex mild plaque in 08/2008   Chronic bronchitis    Chronic renal insufficiency    baseline creatine 1.4; 1.04 in 03/2010   DJD (degenerative joint disease)    hands, knees   Gout    Hyperlipidemia    Hypertension    heart disease diastolic dysfunction ; WN-46%; mild pulmonary edema in 2006;responded to diurectics ; LVH   Kidney stones    Mitral valve disease    Severe mitral annular calcification; mild to moderate stenosis-not clearly rheumatic   Nephrolithiasis     Pacemaker    Retinal hemorrhage    Seizure disorder (HCC)    Shortness of breath    only with exertion   Stroke ([place])    Tachycardia-bradycardia syndrome ([place])    with pauses and syncope;PPM implantation 10/2008,negative stress nuclear study 03/2005   Vertigo     Past Surgical History:  Procedure Laterality Date   CATARACT EXTRACTION W/PHACO  03/29/2011   Procedure: CATARACT EXTRACTION PHACO AND INTRAOCULAR LENS PLACEMENT ([place]);  Surgeon: Tonny Branch;  [facility];  Service: Ophthalmology;  Laterality: Right;  CDE 12.62   INSERT / REPLACE / REMOVE PACEMAKER  11/2008   St.Jude DUAL chamber pacemaker 11/06/2008   MEMBRANE PEEL Right 02/12/2014   Procedure: MEMBRANE PEEL;  Surgeon: [name], MD;  Location: [place];  Service: Ophthalmology;  Laterality: Right;   PARS PLANA VITRECTOMY Right 02/12/2014   Procedure: PARS PLANA VITRECTOMY WITH 25 GAUGE;  Surgeon: [name], MD;  Location: [place];  Service: Ophthalmology;  Laterality: Right;   PPM GENERATOR CHANGEOUT N/A 03/17/2020   Procedure: PPM GENERATOR CHANGEOUT;  Surgeon: Deboraha Sprang, MD;  Location: [place] CV LAB;  Service: Cardiovascular;  Laterality: N/A;   TOTAL KNEE ARTHROPLASTY  04/2009   Right   Current Outpatient Medications on File Prior to Visit  Medication Sig Dispense Refill   albuterol (VENTOLIN HFA) 108 (90 Base) MCG/ACT inhaler TAKE 2 PUFFS EVERY 6  HOURS AS NEEDED 18 g 0   diltiazem (TIAZAC) 360 MG 24 hr capsule Take 1 capsule (360 mg total) by mouth daily. 30 capsule 2   mupirocin ointment (BACTROBAN) 2 % Apply thin amount daily to skin blister 22 g 2   pravastatin (PRAVACHOL) 80 MG tablet TAKE (1) TABLET BY MOUTH AT BEDTIME. 90 tablet 0   SENNA-PLUS 8.6-50 MG tablet Take by mouth 2 (two) times daily.     torsemide (DEMADEX) 20 MG tablet TAKE 3 TABLETS BY MOUTH DAILY 90 tablet 2   Travoprost, BAK Free, (TRAVATAN) 0.004 % SOLN ophthalmic solution Place 1 drop into both eyes at bedtime.      No current  facility-administered medications on file prior to visit.     Current Meds  Medication Sig   albuterol (VENTOLIN HFA) 108 (90 Base) MCG/ACT inhaler TAKE 2 PUFFS EVERY 6 HOURS AS NEEDED   diltiazem (TIAZAC) 360 MG 24 hr capsule Take 1 capsule (360 mg total) by mouth daily.   mupirocin ointment (BACTROBAN) 2 % Apply thin amount daily to skin blister   pravastatin (PRAVACHOL) 80 MG tablet TAKE (1) TABLET BY MOUTH AT BEDTIME.   SENNA-PLUS 8.6-50 MG tablet Take by mouth 2 (two) times daily.   torsemide (DEMADEX) 20 MG tablet TAKE 3 TABLETS BY MOUTH DAILY   Travoprost, BAK Free, (TRAVATAN) 0.004 % SOLN ophthalmic solution Place 1 drop into both eyes at bedtime.     Allergies  Allergen Reactions   Tramadol     Drowsiness      Review of Systems negative except from HPI and PMH  Physical Exam BP 112/74   Pulse 60   Ht 5\' 3"  (1.6 m)   SpO2 98%   BMI 28.87 kg/m  Well developed and nourished in no acute distress sitting in wheelchair HENT normal Neck supple   Carotids brisk and full without bruits Clear Device pocket well healed; without hematoma or erythema.  There is no tethering   Irregularly irregular rate and rhythm with controlled ventricular response, 3/6 murmur  abd-soft with active BS without hepatomegaly No Clubbing cyanosis edema Skin-warm and dry A & Oriented  Grossly normal sensory and motor function  Atrial fibrillation at 60 with intermittent conduction and ventricular pacing and pseudofusion     Assessment and  Plan  Bradycardia  Atrial fib-long standing persistent  Pacemaker [employer] Jude   Renal insufficiency grade 4  HFpEF chronic   She is not on anticoagulation.  Reviewing the notes from Dr. Wolfgang White, it was anticipated that she would be dying shortly so it was not resumed 7/22.  Alas alas.  We will check her renal function today.  And then we will have to make a decision regarding recommendation on anticoagulation.  In this regard it is worth noting  that the bleeding risks increase significantly with decreasing renal function to a greater degree with Coumadin as compared to apixaban.  Heart rate is reasonably controlled as his blood pressure.  We will continue on diltiazem 360  Device function is normal.  She is euvolemic.

## 2021-12-03 NOTE — Patient Instructions (Signed)
Medication Instructions:  Your physician recommends that you continue on your current medications as directed. Please refer to the Current Medication list given to you today.  *If you need a refill on your cardiac medications before your next appointment, please call your pharmacy*   Lab Work: CBC and BMET   If you have labs (blood work) drawn today and your tests are completely normal, you will receive your results only by: [place] (if you have MyChart) OR A paper copy in the mail If you have any lab test that is abnormal or we need to change your treatment, we will call you to review the results.   Testing/Procedures: None ordered.    Follow-Up: At [facility], you and your health needs are our priority.  As part of our continuing mission to provide you with exceptional heart care, we have created designated Provider Care Teams.  These Care Teams include your primary Cardiologist (physician) and Advanced Practice Providers (APPs -  Physician Assistants and Nurse Practitioners) who all work together to provide you with the care you need, when you need it.  We recommend signing up for the patient portal called "MyChart".  Sign up information is provided on this After Visit Summary.  MyChart is used to connect with patients for Virtual Visits (Telemedicine).  Patients are able to view lab/test results, encounter notes, upcoming appointments, etc.  Non-urgent messages can be sent to your provider as well.   To learn more about what you can do with MyChart, go to [email or web address].    Your next appointment:   12 months with Dr Caryl Comes  Important Information About Sugar

## 2021-12-04 LAB — BASIC METABOLIC PANEL
BUN/Creatinine Ratio: 16 (ref 12–28)
BUN: 43 mg/dL — ABNORMAL HIGH (ref 10–36)
CO2: 19 mmol/L — ABNORMAL LOW (ref 20–29)
Calcium: 9.8 mg/dL (ref 8.7–10.3)
Chloride: 105 mmol/L (ref 96–106)
Creatinine, Ser: 2.69 mg/dL — ABNORMAL HIGH (ref 0.57–1.00)
Glucose: 85 mg/dL (ref 70–99)
Potassium: 5 mmol/L (ref 3.5–5.2)
Sodium: 144 mmol/L (ref 134–144)
eGFR: 16 mL/min/{1.73_m2} — ABNORMAL LOW (ref >59)

## 2021-12-04 LAB — CBC
Hematocrit: 31.2 % — ABNORMAL LOW (ref 34.0–46.6)
Hemoglobin: 10.1 g/dL — ABNORMAL LOW (ref 11.1–15.9)
MCH: 28.7 pg (ref 26.6–33.0)
MCHC: 32.4 g/dL (ref 31.5–35.7)
MCV: 89 fL (ref 79–97)
Platelets: 203 10*3/uL (ref 150–450)
RBC: 3.52 x10E6/uL — ABNORMAL LOW (ref 3.77–5.28)
RDW: 13.2 % (ref 11.7–15.4)
WBC: 6.6 10*3/uL (ref 3.4–10.8)

## 2021-12-14 ENCOUNTER — Encounter (INDEPENDENT_AMBULATORY_CARE_PROVIDER_SITE_OTHER): Payer: Medicare HMO | Admitting: Ophthalmology

## 2021-12-14 DIAGNOSIS — I10 Essential (primary) hypertension: Secondary | ICD-10-CM | POA: Diagnosis not present

## 2021-12-14 DIAGNOSIS — H3509 Other intraretinal microvascular abnormalities: Secondary | ICD-10-CM | POA: Diagnosis not present

## 2021-12-14 DIAGNOSIS — H3581 Retinal edema: Secondary | ICD-10-CM

## 2021-12-14 DIAGNOSIS — H35033 Hypertensive retinopathy, bilateral: Secondary | ICD-10-CM

## 2021-12-15 ENCOUNTER — Ambulatory Visit (INDEPENDENT_AMBULATORY_CARE_PROVIDER_SITE_OTHER): Payer: Medicare HMO

## 2021-12-15 DIAGNOSIS — I495 Sick sinus syndrome: Secondary | ICD-10-CM

## 2021-12-15 LAB — CUP PACEART REMOTE DEVICE CHECK
Battery Remaining Longevity: 115 mo
Battery Remaining Percentage: 89 %
Battery Voltage: 3.02 V
Brady Statistic RV Percent Paced: 35 %
Date Time Interrogation Session: 20230711040018
Implantable Lead Implant Date: 20100602
Implantable Lead Implant Date: 20100602
Implantable Lead Location: 753859
Implantable Lead Location: 753860
Implantable Pulse Generator Implant Date: 20211011
Lead Channel Impedance Value: 430 Ohm
Lead Channel Pacing Threshold Amplitude: 1 V
Lead Channel Pacing Threshold Pulse Width: 0.4 ms
Lead Channel Sensing Intrinsic Amplitude: 12 mV
Lead Channel Setting Pacing Amplitude: 2.5 V
Lead Channel Setting Pacing Pulse Width: 0.4 ms
Lead Channel Setting Sensing Sensitivity: 2 mV
Pulse Gen Model: 2272
Pulse Gen Serial Number: 6220844

## 2021-12-22 ENCOUNTER — Other Ambulatory Visit: Payer: Self-pay

## 2021-12-22 ENCOUNTER — Telehealth: Payer: Self-pay

## 2021-12-22 DIAGNOSIS — N184 Chronic kidney disease, stage 4 (severe): Secondary | ICD-10-CM

## 2021-12-22 NOTE — Telephone Encounter (Signed)
Spoke with patient and informed per drs result notes , she informs she is taking 2 diuretics daily.

## 2021-12-22 NOTE — Telephone Encounter (Signed)
Caller name:Amaura Philipp Ovens   On DPR? :Yes  Call back [phone]  Provider they see: Luking   Reason for call:Pt is calling back wanting a call back regarding lab results

## 2021-12-23 NOTE — Telephone Encounter (Signed)
See result note.  

## 2022-01-06 NOTE — Progress Notes (Signed)
Remote pacemaker transmission.   

## 2022-01-11 ENCOUNTER — Encounter (INDEPENDENT_AMBULATORY_CARE_PROVIDER_SITE_OTHER): Admitting: Ophthalmology

## 2022-01-11 DIAGNOSIS — H3509 Other intraretinal microvascular abnormalities: Secondary | ICD-10-CM | POA: Diagnosis not present

## 2022-01-11 DIAGNOSIS — H3561 Retinal hemorrhage, right eye: Secondary | ICD-10-CM | POA: Diagnosis not present

## 2022-01-11 DIAGNOSIS — H3581 Retinal edema: Secondary | ICD-10-CM

## 2022-01-11 DIAGNOSIS — H35031 Hypertensive retinopathy, right eye: Secondary | ICD-10-CM | POA: Diagnosis not present

## 2022-01-11 DIAGNOSIS — H43811 Vitreous degeneration, right eye: Secondary | ICD-10-CM

## 2022-01-11 DIAGNOSIS — I10 Essential (primary) hypertension: Secondary | ICD-10-CM

## 2022-02-22 ENCOUNTER — Encounter (INDEPENDENT_AMBULATORY_CARE_PROVIDER_SITE_OTHER): Admitting: Ophthalmology

## 2022-02-22 DIAGNOSIS — H3561 Retinal hemorrhage, right eye: Secondary | ICD-10-CM

## 2022-02-22 DIAGNOSIS — I10 Essential (primary) hypertension: Secondary | ICD-10-CM | POA: Diagnosis not present

## 2022-02-22 DIAGNOSIS — H35033 Hypertensive retinopathy, bilateral: Secondary | ICD-10-CM | POA: Diagnosis not present

## 2022-02-22 DIAGNOSIS — H3509 Other intraretinal microvascular abnormalities: Secondary | ICD-10-CM | POA: Diagnosis not present

## 2022-02-22 DIAGNOSIS — H43813 Vitreous degeneration, bilateral: Secondary | ICD-10-CM | POA: Diagnosis not present

## 2022-03-01 ENCOUNTER — Telehealth: Payer: Self-pay | Admitting: Family Medicine

## 2022-03-01 ENCOUNTER — Telehealth: Payer: Self-pay

## 2022-03-01 NOTE — Telephone Encounter (Signed)
Caller name:Jaylei Philipp Ovens  On DPR? :Yes  Call back [phone]  Provider they see: Luking   Reason for call:[name] wanted to know if it is ok for hospice nurse to give flu shot?

## 2022-03-01 NOTE — Telephone Encounter (Signed)
Please advise. Thank you

## 2022-03-01 NOTE — Telephone Encounter (Signed)
Yes it is It would be a good idea

## 2022-03-01 NOTE — Telephone Encounter (Signed)
Daughter [name] called asking for guidance. She would like to know if the nurse that comes out and sees her mom can give her mom the flu shot.   CB# [phone]

## 2022-03-01 NOTE — Telephone Encounter (Signed)
Duplicate message. 

## 2022-03-02 ENCOUNTER — Inpatient Hospital Stay (HOSPITAL_COMMUNITY)
Admission: EM | Admit: 2022-03-02 | Discharge: 2022-03-05 | DRG: 177 | Disposition: A | Discharge status: Hospice | Payer: Medicare Other | Attending: Internal Medicine | Admitting: Internal Medicine

## 2022-03-02 ENCOUNTER — Emergency Department (HOSPITAL_COMMUNITY): Payer: Medicare Other

## 2022-03-02 ENCOUNTER — Other Ambulatory Visit: Payer: Self-pay

## 2022-03-02 ENCOUNTER — Encounter (HOSPITAL_COMMUNITY): Payer: Self-pay

## 2022-03-02 DIAGNOSIS — J440 Chronic obstructive pulmonary disease with acute lower respiratory infection: Secondary | ICD-10-CM | POA: Diagnosis present

## 2022-03-02 DIAGNOSIS — U071 COVID-19: Principal | ICD-10-CM | POA: Diagnosis present

## 2022-03-02 DIAGNOSIS — J1282 Pneumonia due to coronavirus disease 2019: Secondary | ICD-10-CM | POA: Diagnosis present

## 2022-03-02 DIAGNOSIS — R55 Syncope and collapse: Secondary | ICD-10-CM

## 2022-03-02 DIAGNOSIS — N184 Chronic kidney disease, stage 4 (severe): Secondary | ICD-10-CM

## 2022-03-02 DIAGNOSIS — I130 Hypertensive heart and chronic kidney disease with heart failure and stage 1 through stage 4 chronic kidney disease, or unspecified chronic kidney disease: Secondary | ICD-10-CM | POA: Diagnosis present

## 2022-03-02 DIAGNOSIS — I272 Pulmonary hypertension, unspecified: Secondary | ICD-10-CM | POA: Diagnosis present

## 2022-03-02 DIAGNOSIS — Z96651 Presence of right artificial knee joint: Secondary | ICD-10-CM | POA: Diagnosis present

## 2022-03-02 DIAGNOSIS — Z9981 Dependence on supplemental oxygen: Secondary | ICD-10-CM

## 2022-03-02 DIAGNOSIS — G40909 Epilepsy, unspecified, not intractable, without status epilepticus: Secondary | ICD-10-CM | POA: Diagnosis present

## 2022-03-02 DIAGNOSIS — I083 Combined rheumatic disorders of mitral, aortic and tricuspid valves: Secondary | ICD-10-CM | POA: Diagnosis present

## 2022-03-02 DIAGNOSIS — J9611 Chronic respiratory failure with hypoxia: Secondary | ICD-10-CM | POA: Diagnosis present

## 2022-03-02 DIAGNOSIS — J811 Chronic pulmonary edema: Secondary | ICD-10-CM | POA: Diagnosis not present

## 2022-03-02 DIAGNOSIS — J90 Pleural effusion, not elsewhere classified: Secondary | ICD-10-CM | POA: Diagnosis not present

## 2022-03-02 DIAGNOSIS — E669 Obesity, unspecified: Secondary | ICD-10-CM | POA: Diagnosis present

## 2022-03-02 DIAGNOSIS — I4891 Unspecified atrial fibrillation: Secondary | ICD-10-CM | POA: Diagnosis not present

## 2022-03-02 DIAGNOSIS — Z6831 Body mass index (BMI) 31.0-31.9, adult: Secondary | ICD-10-CM | POA: Diagnosis not present

## 2022-03-02 DIAGNOSIS — I4821 Permanent atrial fibrillation: Secondary | ICD-10-CM | POA: Diagnosis present

## 2022-03-02 DIAGNOSIS — I21A1 Myocardial infarction type 2: Secondary | ICD-10-CM | POA: Diagnosis present

## 2022-03-02 DIAGNOSIS — I10 Essential (primary) hypertension: Secondary | ICD-10-CM | POA: Diagnosis not present

## 2022-03-02 DIAGNOSIS — I5032 Chronic diastolic (congestive) heart failure: Secondary | ICD-10-CM | POA: Diagnosis not present

## 2022-03-02 DIAGNOSIS — Z950 Presence of cardiac pacemaker: Secondary | ICD-10-CM | POA: Diagnosis not present

## 2022-03-02 DIAGNOSIS — Z87442 Personal history of urinary calculi: Secondary | ICD-10-CM

## 2022-03-02 DIAGNOSIS — I5033 Acute on chronic diastolic (congestive) heart failure: Secondary | ICD-10-CM | POA: Diagnosis present

## 2022-03-02 DIAGNOSIS — N185 Chronic kidney disease, stage 5: Secondary | ICD-10-CM | POA: Diagnosis not present

## 2022-03-02 DIAGNOSIS — R410 Disorientation, unspecified: Secondary | ICD-10-CM | POA: Diagnosis not present

## 2022-03-02 DIAGNOSIS — I160 Hypertensive urgency: Secondary | ICD-10-CM

## 2022-03-02 DIAGNOSIS — I161 Hypertensive emergency: Secondary | ICD-10-CM

## 2022-03-02 DIAGNOSIS — Z961 Presence of intraocular lens: Secondary | ICD-10-CM | POA: Diagnosis present

## 2022-03-02 DIAGNOSIS — R2681 Unsteadiness on feet: Secondary | ICD-10-CM | POA: Diagnosis present

## 2022-03-02 DIAGNOSIS — Z8673 Personal history of transient ischemic attack (TIA), and cerebral infarction without residual deficits: Secondary | ICD-10-CM | POA: Diagnosis not present

## 2022-03-02 DIAGNOSIS — R778 Other specified abnormalities of plasma proteins: Secondary | ICD-10-CM

## 2022-03-02 DIAGNOSIS — Z9841 Cataract extraction status, right eye: Secondary | ICD-10-CM

## 2022-03-02 DIAGNOSIS — R404 Transient alteration of awareness: Secondary | ICD-10-CM | POA: Diagnosis not present

## 2022-03-02 DIAGNOSIS — R7989 Other specified abnormal findings of blood chemistry: Secondary | ICD-10-CM | POA: Diagnosis present

## 2022-03-02 DIAGNOSIS — E1122 Type 2 diabetes mellitus with diabetic chronic kidney disease: Secondary | ICD-10-CM | POA: Diagnosis present

## 2022-03-02 DIAGNOSIS — E119 Type 2 diabetes mellitus without complications: Secondary | ICD-10-CM | POA: Diagnosis not present

## 2022-03-02 DIAGNOSIS — I495 Sick sinus syndrome: Secondary | ICD-10-CM | POA: Diagnosis present

## 2022-03-02 DIAGNOSIS — E782 Mixed hyperlipidemia: Secondary | ICD-10-CM | POA: Diagnosis present

## 2022-03-02 DIAGNOSIS — R4182 Altered mental status, unspecified: Secondary | ICD-10-CM | POA: Diagnosis not present

## 2022-03-02 DIAGNOSIS — Z885 Allergy status to narcotic agent status: Secondary | ICD-10-CM

## 2022-03-02 DIAGNOSIS — R531 Weakness: Secondary | ICD-10-CM | POA: Diagnosis not present

## 2022-03-02 LAB — CBC WITH DIFFERENTIAL/PLATELET
Abs Immature Granulocytes: 0.04 10*3/uL (ref 0.00–0.07)
Basophils Absolute: 0 10*3/uL (ref 0.0–0.1)
Basophils Relative: 0 %
Eosinophils Absolute: 0 10*3/uL (ref 0.0–0.5)
Eosinophils Relative: 0 %
HCT: 35.3 % — ABNORMAL LOW (ref 36.0–46.0)
Hemoglobin: 11 g/dL — ABNORMAL LOW (ref 12.0–15.0)
Immature Granulocytes: 1 %
Lymphocytes Relative: 5 %
Lymphs Abs: 0.5 10*3/uL — ABNORMAL LOW (ref 0.7–4.0)
MCH: 28.7 pg (ref 26.0–34.0)
MCHC: 31.2 g/dL (ref 30.0–36.0)
MCV: 92.2 fL (ref 80.0–100.0)
Monocytes Absolute: 0.9 10*3/uL (ref 0.1–1.0)
Monocytes Relative: 10 %
Neutro Abs: 7.4 10*3/uL (ref 1.7–7.7)
Neutrophils Relative %: 84 %
Platelets: 197 10*3/uL (ref 150–400)
RBC: 3.83 MIL/uL — ABNORMAL LOW (ref 3.87–5.11)
RDW: 13.5 % (ref 11.5–15.5)
WBC: 8.9 10*3/uL (ref 4.0–10.5)
nRBC: 0 % (ref 0.0–0.2)

## 2022-03-02 LAB — URINALYSIS, ROUTINE W REFLEX MICROSCOPIC
Bilirubin Urine: NEGATIVE
Glucose, UA: NEGATIVE mg/dL
Ketones, ur: NEGATIVE mg/dL
Leukocytes,Ua: NEGATIVE
Nitrite: NEGATIVE
Protein, ur: >=300 mg/dL — AB
Specific Gravity, Urine: 1.013 (ref 1.005–1.030)
pH: 6 (ref 5.0–8.0)

## 2022-03-02 LAB — COMPREHENSIVE METABOLIC PANEL
ALT: 11 U/L (ref 0–44)
AST: 17 U/L (ref 15–41)
Albumin: 4.5 g/dL (ref 3.5–5.0)
Alkaline Phosphatase: 85 U/L (ref 38–126)
Anion gap: 8 (ref 5–15)
BUN: 35 mg/dL — ABNORMAL HIGH (ref 8–23)
CO2: 22 mmol/L (ref 22–32)
Calcium: 10 mg/dL (ref 8.9–10.3)
Chloride: 108 mmol/L (ref 98–111)
Creatinine, Ser: 2.2 mg/dL — ABNORMAL HIGH (ref 0.44–1.00)
GFR, Estimated: 20 mL/min — ABNORMAL LOW (ref >60)
Glucose, Bld: 101 mg/dL — ABNORMAL HIGH (ref 70–99)
Potassium: 4.8 mmol/L (ref 3.5–5.1)
Sodium: 138 mmol/L (ref 135–145)
Total Bilirubin: 0.5 mg/dL (ref 0.3–1.2)
Total Protein: 7.8 g/dL (ref 6.5–8.1)

## 2022-03-02 LAB — CBG MONITORING, ED: Glucose-Capillary: 91 mg/dL (ref 70–99)

## 2022-03-02 LAB — LACTIC ACID, PLASMA: Lactic Acid, Venous: 0.9 mmol/L (ref 0.5–1.9)

## 2022-03-02 LAB — AMMONIA: Ammonia: 13 umol/L (ref 9–35)

## 2022-03-02 LAB — TROPONIN I (HIGH SENSITIVITY): Troponin I (High Sensitivity): 52 ng/L — ABNORMAL HIGH (ref <18)

## 2022-03-02 LAB — BRAIN NATRIURETIC PEPTIDE: B Natriuretic Peptide: 965 pg/mL — ABNORMAL HIGH (ref 0.0–100.0)

## 2022-03-02 LAB — SARS CORONAVIRUS 2 BY RT PCR: SARS Coronavirus 2 by RT PCR: POSITIVE — AB

## 2022-03-02 MED ORDER — LABETALOL HCL 5 MG/ML IV SOLN
10.0000 mg | Freq: Once | INTRAVENOUS | Status: AC
  Administered 2022-03-02: 10 mg via INTRAVENOUS
  Filled 2022-03-02: qty 4

## 2022-03-02 MED ORDER — MOLNUPIRAVIR EUA 200MG CAPSULE
4.0000 | ORAL_CAPSULE | Freq: Two times a day (BID) | ORAL | Status: DC
  Administered 2022-03-03 – 2022-03-05 (×6): 800 mg via ORAL
  Filled 2022-03-02: qty 4

## 2022-03-02 MED ORDER — SODIUM CHLORIDE 0.9 % IV BOLUS: 1000.0000 mL | Freq: Once | INTRAVENOUS | Status: DC

## 2022-03-02 MED ORDER — SODIUM CHLORIDE 0.9 % IV BOLUS
500.0000 mL | Freq: Once | INTRAVENOUS | Status: AC
  Administered 2022-03-02: 500 mL via INTRAVENOUS

## 2022-03-02 MED ORDER — NITROGLYCERIN 2 % TD OINT
1.0000 [in_us] | TOPICAL_OINTMENT | Freq: Once | TRANSDERMAL | Status: AC
  Administered 2022-03-02: 1 [in_us] via TOPICAL
  Filled 2022-03-02: qty 1

## 2022-03-02 MED ORDER — FUROSEMIDE 10 MG/ML IJ SOLN
40.0000 mg | Freq: Once | INTRAMUSCULAR | Status: AC
  Administered 2022-03-02: 40 mg via INTRAVENOUS
  Filled 2022-03-02: qty 4

## 2022-03-02 NOTE — Telephone Encounter (Signed)
Pt daugter [name] contacted and verbalized understanding.

## 2022-03-02 NOTE — ED Triage Notes (Signed)
Pt to er, pt states that she is here because she has been passing out since yesterday, pt oriented to person and place, pt doesn't know day of the week or month, re oriented pt.

## 2022-03-02 NOTE — ED Triage Notes (Signed)
Pt brought in by [facility] for c/o increased weakness that started yesterday; ems reports pt has strong smell of urine  Family reported to ems pt was more lethargic the last couple of days

## 2022-03-02 NOTE — ED Provider Notes (Signed)
[facility] EMERGENCY DEPARTMENT Provider Note   CSN: [id number] Arrival date & time: 03/02/22  1840     History {Add pertinent medical, surgical, social history, OB history to HPI:1} Chief Complaint  Patient presents with   Loss of Consciousness    [name] is a 88 y.o. female.  She is brought in by ambulance from home.  Altered mental status level 5 caveat.  Patient lives with her husband.  Daughter is providing history.  Said she has had multiple episodes of going in and out of consciousness today, unsteadiness on her feet and being confused.  Has been eating and drinking well.  Patient denies any complaints no headache chest pain cough shortness of breath nausea vomiting diarrhea or urinary symptoms.  The history is provided by the patient, the EMS personnel and a relative.  Loss of Consciousness Episode history:  Multiple Most recent episode:  Today Timing:  Intermittent Progression:  Unchanged Chronicity:  New Context: normal activity   Witnessed: yes   Relieved by:  None tried Worsened by:  Nothing Ineffective treatments:  None tried Associated symptoms: confusion   Associated symptoms: no chest pain, no difficulty breathing, no fever, no focal weakness, no headaches, no nausea, no shortness of breath and no vomiting        Home Medications Prior to Admission medications   Medication Sig Start Date End Date Taking? Authorizing Provider  albuterol (VENTOLIN HFA) 108 (90 Base) MCG/ACT inhaler TAKE 2 PUFFS EVERY 6 HOURS AS NEEDED 10/23/20   Kathyrn Drown, MD  diltiazem (TIAZAC) 360 MG 24 hr capsule Take 1 capsule (360 mg total) by mouth daily. 08/03/21   Kathyrn Drown, MD  mupirocin ointment (BACTROBAN) 2 % Apply thin amount daily to skin blister 07/14/20   Kathyrn Drown, MD  pravastatin (PRAVACHOL) 80 MG tablet TAKE (1) TABLET BY MOUTH AT BEDTIME. 05/21/20   Luking, Elayne Snare, MD  SENNA-PLUS 8.6-50 MG tablet Take by mouth 2 (two) times daily. 11/23/21    [provider]  torsemide (DEMADEX) 20 MG tablet TAKE 3 TABLETS BY MOUTH DAILY 08/03/21   Kathyrn Drown, MD  Travoprost, BAK Free, (TRAVATAN) 0.004 % SOLN ophthalmic solution Place 1 drop into both eyes at bedtime.     [provider]      Allergies    Tramadol    Review of Systems   Review of Systems  Constitutional:  Negative for fever.  Respiratory:  Negative for shortness of breath.   Cardiovascular:  Positive for syncope. Negative for chest pain.  Gastrointestinal:  Positive for constipation. Negative for nausea and vomiting.  Neurological:  Negative for focal weakness and headaches.  Psychiatric/Behavioral:  Positive for confusion.     Physical Exam Updated Vital Signs Ht 5\' 2"  (1.575 m)   Wt 78.9 kg   BMI 31.83 kg/m  Physical Exam Vitals and nursing note reviewed.  Constitutional:      General: She is not in acute distress.    Appearance: Normal appearance. She is well-developed.  HENT:     Head: Normocephalic and atraumatic.  Eyes:     Conjunctiva/sclera: Conjunctivae normal.  Cardiovascular:     Rate and Rhythm: Tachycardia present. Rhythm irregular.     Pulses: Normal pulses.     Heart sounds: No murmur heard. Pulmonary:     Effort: Pulmonary effort is normal. No respiratory distress.     Breath sounds: Normal breath sounds.  Abdominal:     Palpations: Abdomen is soft.  Tenderness: There is no abdominal tenderness. There is no guarding or rebound.  Musculoskeletal:        General: No swelling.     Cervical back: Neck supple.  Skin:    General: Skin is warm and dry.     Capillary Refill: Capillary refill takes less than 2 seconds.  Neurological:     General: No focal deficit present.     Mental Status: She is alert. She is disoriented.     Comments: She is awake and alert.  Speech is fluent.  No facial droop.  Normal strength upper and lower extremities.     ED Results / Procedures / Treatments   Labs (all labs ordered are  listed, but only abnormal results are displayed) Labs Reviewed - No data to display  EKG None  Radiology No results found.  Procedures Procedures  {Document cardiac monitor, telemetry assessment procedure when appropriate:1}  Medications Ordered in ED Medications  sodium chloride 0.9 % bolus 1,000 mL (has no administration in time range)    ED Course/ Medical Decision Making/ A&P                           Medical Decision Making Amount and/or Complexity of Data Reviewed Labs: ordered. Radiology: ordered.  This patient complains of ***; this involves an extensive number of treatment Options and is a complaint that carries with it a high risk of complications and morbidity. The differential includes ***  I ordered, reviewed and interpreted labs, which included *** I ordered medication *** and reviewed PMP when indicated. I ordered imaging studies which included *** and I independently    visualized and interpreted imaging which showed *** Additional history obtained from *** Previous records obtained and reviewed *** I consulted *** and discussed lab and imaging findings and discussed disposition.  Cardiac monitoring reviewed, *** Social determinants considered, *** Critical Interventions: ***  After the interventions stated above, I reevaluated the patient and found *** Admission and further testing considered, ***    {Document critical care time when appropriate:1} {Document review of labs and clinical decision tools ie heart score, Chads2Vasc2 etc:1}  {Document your independent review of radiology images, and any outside records:1} {Document your discussion with family members, caretakers, and with consultants:1} {Document social determinants of health affecting pt's care:1} {Document your decision making why or why not admission, treatments were needed:1} Final Clinical Impression(s) / ED Diagnoses Final diagnoses:  None    Rx / DC Orders ED Discharge  Orders     None

## 2022-03-03 ENCOUNTER — Inpatient Hospital Stay (HOSPITAL_COMMUNITY): Payer: Medicare Other

## 2022-03-03 DIAGNOSIS — I5032 Chronic diastolic (congestive) heart failure: Secondary | ICD-10-CM

## 2022-03-03 DIAGNOSIS — R2681 Unsteadiness on feet: Secondary | ICD-10-CM | POA: Diagnosis present

## 2022-03-03 DIAGNOSIS — I4891 Unspecified atrial fibrillation: Secondary | ICD-10-CM | POA: Diagnosis not present

## 2022-03-03 DIAGNOSIS — E669 Obesity, unspecified: Secondary | ICD-10-CM

## 2022-03-03 DIAGNOSIS — I4821 Permanent atrial fibrillation: Secondary | ICD-10-CM

## 2022-03-03 DIAGNOSIS — E782 Mixed hyperlipidemia: Secondary | ICD-10-CM | POA: Diagnosis present

## 2022-03-03 DIAGNOSIS — R7989 Other specified abnormal findings of blood chemistry: Secondary | ICD-10-CM

## 2022-03-03 DIAGNOSIS — I161 Hypertensive emergency: Secondary | ICD-10-CM | POA: Diagnosis present

## 2022-03-03 DIAGNOSIS — I130 Hypertensive heart and chronic kidney disease with heart failure and stage 1 through stage 4 chronic kidney disease, or unspecified chronic kidney disease: Secondary | ICD-10-CM | POA: Diagnosis present

## 2022-03-03 DIAGNOSIS — I21A1 Myocardial infarction type 2: Secondary | ICD-10-CM | POA: Diagnosis present

## 2022-03-03 DIAGNOSIS — Z6831 Body mass index (BMI) 31.0-31.9, adult: Secondary | ICD-10-CM | POA: Diagnosis not present

## 2022-03-03 DIAGNOSIS — U071 COVID-19: Secondary | ICD-10-CM | POA: Diagnosis present

## 2022-03-03 DIAGNOSIS — R778 Other specified abnormalities of plasma proteins: Secondary | ICD-10-CM

## 2022-03-03 DIAGNOSIS — J1282 Pneumonia due to coronavirus disease 2019: Secondary | ICD-10-CM | POA: Diagnosis present

## 2022-03-03 DIAGNOSIS — I495 Sick sinus syndrome: Secondary | ICD-10-CM

## 2022-03-03 DIAGNOSIS — R55 Syncope and collapse: Secondary | ICD-10-CM

## 2022-03-03 DIAGNOSIS — I5033 Acute on chronic diastolic (congestive) heart failure: Secondary | ICD-10-CM

## 2022-03-03 DIAGNOSIS — Z8673 Personal history of transient ischemic attack (TIA), and cerebral infarction without residual deficits: Secondary | ICD-10-CM | POA: Diagnosis not present

## 2022-03-03 DIAGNOSIS — N184 Chronic kidney disease, stage 4 (severe): Secondary | ICD-10-CM | POA: Diagnosis present

## 2022-03-03 DIAGNOSIS — Z87442 Personal history of urinary calculi: Secondary | ICD-10-CM | POA: Diagnosis not present

## 2022-03-03 DIAGNOSIS — G40909 Epilepsy, unspecified, not intractable, without status epilepticus: Secondary | ICD-10-CM | POA: Diagnosis present

## 2022-03-03 DIAGNOSIS — I272 Pulmonary hypertension, unspecified: Secondary | ICD-10-CM | POA: Diagnosis present

## 2022-03-03 DIAGNOSIS — I083 Combined rheumatic disorders of mitral, aortic and tricuspid valves: Secondary | ICD-10-CM | POA: Diagnosis present

## 2022-03-03 DIAGNOSIS — N185 Chronic kidney disease, stage 5: Secondary | ICD-10-CM | POA: Diagnosis not present

## 2022-03-03 DIAGNOSIS — E1122 Type 2 diabetes mellitus with diabetic chronic kidney disease: Secondary | ICD-10-CM | POA: Diagnosis present

## 2022-03-03 DIAGNOSIS — Z950 Presence of cardiac pacemaker: Secondary | ICD-10-CM | POA: Diagnosis not present

## 2022-03-03 DIAGNOSIS — J440 Chronic obstructive pulmonary disease with acute lower respiratory infection: Secondary | ICD-10-CM | POA: Diagnosis present

## 2022-03-03 DIAGNOSIS — J9611 Chronic respiratory failure with hypoxia: Secondary | ICD-10-CM | POA: Diagnosis present

## 2022-03-03 DIAGNOSIS — I10 Essential (primary) hypertension: Secondary | ICD-10-CM

## 2022-03-03 DIAGNOSIS — Z96651 Presence of right artificial knee joint: Secondary | ICD-10-CM | POA: Diagnosis present

## 2022-03-03 LAB — ECHOCARDIOGRAM COMPLETE
AR max vel: 1.33 cm2
AV Area VTI: 1.23 cm2
AV Area mean vel: 1.31 cm2
AV Mean grad: 5.7 mmHg
AV Peak grad: 12.5 mmHg
Ao pk vel: 1.77 m/s
Area-P 1/2: 1.74 cm2
Height: 62 in
MV VTI: 0.63 cm2
S' Lateral: 1.63 cm
Weight: 2784 oz

## 2022-03-03 LAB — TROPONIN I (HIGH SENSITIVITY)
Troponin I (High Sensitivity): 121 ng/L (ref <18)
Troponin I (High Sensitivity): 251 ng/L (ref <18)
Troponin I (High Sensitivity): 205 ng/L (ref <18)

## 2022-03-03 MED ORDER — GUAIFENESIN-DM 100-10 MG/5ML PO SYRP: 10.0000 mL | ORAL_SOLUTION | ORAL | Status: DC | PRN

## 2022-03-03 MED ORDER — CHLORHEXIDINE GLUCONATE CLOTH 2 % EX PADS
6.0000 | MEDICATED_PAD | Freq: Every day | CUTANEOUS | Status: DC
  Administered 2022-03-04 – 2022-03-05 (×2): 6 via TOPICAL

## 2022-03-03 MED ORDER — FUROSEMIDE 10 MG/ML IJ SOLN
40.0000 mg | Freq: Two times a day (BID) | INTRAMUSCULAR | Status: DC
  Administered 2022-03-03 – 2022-03-04 (×3): 40 mg via INTRAVENOUS
  Filled 2022-03-03 (×3): qty 4

## 2022-03-03 MED ORDER — ALBUTEROL SULFATE HFA 108 (90 BASE) MCG/ACT IN AERS: 2.0000 | INHALATION_SPRAY | Freq: Four times a day (QID) | RESPIRATORY_TRACT | Status: DC | PRN

## 2022-03-03 MED ORDER — ONDANSETRON HCL 4 MG/2ML IJ SOLN
4.0000 mg | Freq: Four times a day (QID) | INTRAMUSCULAR | Status: DC | PRN
  Administered 2022-03-05: 4 mg via INTRAVENOUS
  Filled 2022-03-03: qty 2

## 2022-03-03 MED ORDER — FUROSEMIDE 10 MG/ML IJ SOLN
40.0000 mg | Freq: Once | INTRAMUSCULAR | Status: AC
  Administered 2022-03-03: 40 mg via INTRAVENOUS
  Filled 2022-03-03: qty 4

## 2022-03-03 MED ORDER — HEPARIN SODIUM (PORCINE) 5000 UNIT/ML IJ SOLN
5000.0000 [IU] | Freq: Three times a day (TID) | INTRAMUSCULAR | Status: DC
  Administered 2022-03-03 – 2022-03-05 (×6): 5000 [IU] via SUBCUTANEOUS
  Filled 2022-03-03 (×6): qty 1

## 2022-03-03 MED ORDER — HEPARIN BOLUS VIA INFUSION
3000.0000 [IU] | Freq: Once | INTRAVENOUS | Status: AC
  Administered 2022-03-03: 3000 [IU] via INTRAVENOUS

## 2022-03-03 MED ORDER — PRAVASTATIN SODIUM 40 MG PO TABS
80.0000 mg | ORAL_TABLET | Freq: Every day | ORAL | Status: DC
  Administered 2022-03-03 – 2022-03-04 (×2): 80 mg via ORAL
  Filled 2022-03-03 (×2): qty 2

## 2022-03-03 MED ORDER — PREDNISOLONE ACETATE 1 % OP SUSP
1.0000 [drp] | Freq: Four times a day (QID) | OPHTHALMIC | Status: DC
  Administered 2022-03-03 – 2022-03-05 (×7): 1 [drp] via OPHTHALMIC
  Filled 2022-03-03: qty 1

## 2022-03-03 MED ORDER — HEPARIN (PORCINE) 25000 UT/250ML-% IV SOLN
800.0000 [IU]/h | INTRAVENOUS | Status: DC
  Administered 2022-03-03: 800 [IU]/h via INTRAVENOUS
  Filled 2022-03-03: qty 250

## 2022-03-03 MED ORDER — HYDRALAZINE HCL 20 MG/ML IJ SOLN
10.0000 mg | INTRAMUSCULAR | Status: DC | PRN
  Administered 2022-03-04 (×2): 10 mg via INTRAVENOUS
  Filled 2022-03-03 (×2): qty 1

## 2022-03-03 MED ORDER — ONDANSETRON HCL 4 MG PO TABS: 4.0000 mg | ORAL_TABLET | Freq: Four times a day (QID) | ORAL | Status: DC | PRN

## 2022-03-03 MED ORDER — DM-GUAIFENESIN ER 30-600 MG PO TB12
1.0000 | ORAL_TABLET | Freq: Two times a day (BID) | ORAL | Status: DC
  Administered 2022-03-03 – 2022-03-05 (×5): 1 via ORAL
  Filled 2022-03-03 (×5): qty 1

## 2022-03-03 MED ORDER — DILTIAZEM HCL ER COATED BEADS 120 MG PO CP24
360.0000 mg | ORAL_CAPSULE | Freq: Every day | ORAL | Status: DC
  Administered 2022-03-03 – 2022-03-05 (×3): 360 mg via ORAL
  Filled 2022-03-03: qty 2
  Filled 2022-03-03: qty 3
  Filled 2022-03-03: qty 2

## 2022-03-03 MED ORDER — NITROGLYCERIN IN D5W 200-5 MCG/ML-% IV SOLN
5.0000 ug/min | INTRAVENOUS | Status: DC
  Administered 2022-03-03: 5 ug/min via INTRAVENOUS
  Filled 2022-03-03 (×2): qty 250

## 2022-03-03 MED ORDER — PANTOPRAZOLE SODIUM 40 MG IV SOLR
40.0000 mg | Freq: Every day | INTRAVENOUS | Status: DC
  Administered 2022-03-03 – 2022-03-04 (×2): 40 mg via INTRAVENOUS
  Filled 2022-03-03 (×2): qty 10

## 2022-03-03 MED ORDER — VITAMIN C 500 MG PO TABS
500.0000 mg | ORAL_TABLET | Freq: Every day | ORAL | Status: DC
  Administered 2022-03-03 – 2022-03-05 (×3): 500 mg via ORAL
  Filled 2022-03-03 (×3): qty 1

## 2022-03-03 MED ORDER — ZINC SULFATE 220 (50 ZN) MG PO CAPS
220.0000 mg | ORAL_CAPSULE | Freq: Every day | ORAL | Status: DC
  Administered 2022-03-03 – 2022-03-05 (×3): 220 mg via ORAL
  Filled 2022-03-03 (×3): qty 1

## 2022-03-03 MED ORDER — LATANOPROST 0.005 % OP SOLN
1.0000 [drp] | Freq: Every day | OPHTHALMIC | Status: DC
  Administered 2022-03-03 – 2022-03-04 (×2): 1 [drp] via OPHTHALMIC
  Filled 2022-03-03: qty 2.5

## 2022-03-03 NOTE — ED Notes (Signed)
Update provided to daughter.

## 2022-03-03 NOTE — Progress Notes (Signed)
*  PRELIMINARY RESULTS* Echocardiogram 2D Echocardiogram has been performed.  Elpidio Anis 03/03/2022, 10:28 AM

## 2022-03-03 NOTE — ED Notes (Signed)
Pt was given lunch tray able to feed self

## 2022-03-03 NOTE — ED Notes (Signed)
US at bedside

## 2022-03-03 NOTE — ED Notes (Signed)
Pt given meal tray and positioned to be able to feed self.  Granddaughter at bedside.

## 2022-03-03 NOTE — TOC Initial Note (Addendum)
Transition of Care [facility]) - Initial/Assessment Note    Patient Details  Name: [name] MRN: [id number] Date of Birth: [birth year]  [facility]) CM/SW Contact:    Boneta Lucks, RN Phone Number: 03/03/2022, 11:34 AM  Clinical Narrative:    Patient admitted with Covid, She lives at home with her husband.  TOC spoke with her daughter.  Patient is active with [facility]. Patient DOES NOT know. A nurse comes out every Monday and and aide to bathe her. She has all equipment needed.   Patient is active with [facility] for home oxygen at 2L.            Expected Discharge Plan: Home w Hospice Care Barriers to Discharge: Continued Medical Work up   Patient Goals and CMS Choice Patient states their goals for this hospitalization and ongoing recovery are:: to go home. CMS Medicare.gov Compare Post Acute Care list provided to:: Patient Represenative (must comment) Choice offered to / list presented to : Adult Children  Expected Discharge Plan and Services Expected Discharge Plan: [place] Acute Care Choice: Hospice Living arrangements for the past 2 months: Single Family Home                      Prior Living Arrangements/Services Living arrangements for the past 2 months: Single Family Home Lives with:: Spouse            Current home services: Home RN    Admission diagnosis:  COVID-19 virus infection [U07.1] Patient Active Problem List   Diagnosis Date Noted   COVID-19 virus infection 03/03/2022   Hypertensive emergency 03/03/2022   Syncope 03/03/2022   Obesity (BMI 30-39.9) 03/03/2022   Longstanding persistent atrial fibrillation ([place]) 12/03/2021   Bradycardia 10/20/2021   HFrEF (heart failure with reduced ejection fraction) ([place]) 10/20/2021   Palliative care patient 12/12/2020   GI bleed 11/22/2020   Nausea 11/22/2020   Elevated lipase 11/22/2020   Elevated troponin 11/22/2020   Elevated brain natriuretic peptide  (BNP) level 11/22/2020   Acute on chronic diastolic CHF (congestive heart failure) ([place]) 11/22/2020   Fecal impaction ([place])    Melena    CKD (chronic kidney disease), stage IV ([place]) 10/21/2020   Blister 07/10/2020   Stage 3b chronic kidney disease ([place]) 05/05/2020   Aortic atherosclerosis ([place]) 05/05/2020   Pleural effusion due to CHF (congestive heart failure) ([place]) 08/28/2019   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   Anorexia 10/23/2018   Thyroid nodule 10/04/2017   Right sided weakness    Acute ischemic stroke ([place]) 09/20/2017   Controlled type 2 diabetes mellitus with complication, without long-term current use of insulin ([place]) 07/08/2016   Prediabetes 12/25/2015   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   TIA (transient ischemic attack) 08/20/2013   Gout 11/14/2012   Fasting hyperglycemia 04/06/2012   Atrial fibrillation with RVR ([place]) 08/31/2011   Chronic anticoagulation 12/16/2010   PPM-[place] 11/17/2010   DJD (degenerative joint disease)    Essential hypertension 02/21/2009   Sick sinus syndrome ([place]) 02/21/2009   Mixed hyperlipidemia 11/26/2008   Constipation 10/31/2008   PCP:  Kathyrn Drown, MD Pharmacy:   [place], [place] [address] Phone: [phone] Fax: [phone]   Readmission Risk Interventions    03/03/2022   11:33 AM 11/25/2020   11:57 AM  Readmission Risk Prevention Plan  Post Dischage Appt Not Complete  Medication Screening Complete   Transportation Screening Complete Complete  Home Care Screening  Complete  Medication Review (RN CM)  Complete

## 2022-03-03 NOTE — H&P (Signed)
History and Physical    Patient: [name] UVO:[id number] DOB: Nov 08, 1926 DOA: 03/02/2022 DOS: the patient was seen and examined on 03/03/2022 PCP: Kathyrn Drown, MD  Patient coming from: Home  Chief Complaint:  Chief Complaint  Patient presents with   Loss of Consciousness   HPI: [name] is a 88 y.o. female with medical history significant of SSS status post pacemaker placement, hypertension, CKD stage IV, chronic atrial fibrillation, Hyperlipidemia, COPD who presents to the emergency department via EMS due to altered mental status.  Patient lives with elderly husband, she was unable to provide a history possibly due to the altered mental status, history was obtained from ED physician and ED medical record.  Per report, patient had several episodes of intermittent loss of consciousness today, she was also noted to be confused and unsteady on trying to ambulate.  Patient denies headache, chest pain, fever, chills, nausea or vomiting.  ED Course:  In the emergency department, she was tachypneic, tachycardic and BP was 219/87.  Temperature 98.7F and O2 sat was 99% on room air.  Work-up in the ED showed normocytic anemia, BMP was normal except for blood glucose of 101 and BUN/creatinine 35/2.20 (creatinine was within baseline range), troponin x 2 -52 > 121, BNP 965, lactic acid 0.9, ammonia 13, urinalysis was unimpressive for UTI.  SARS coronavirus 2 was positive. Chest x-ray showed mild cardiogenic pulmonary edema with small bilateral pleural effusions CT head without contrast showed no acute rectal abnormality She was treated with IV labetalol and nitroglycerin patch due to elevated BP without much improvement, so she was placed on IV nitroglycerin.  IV Lasix 40x2 was given, IV hydration of NS 500 mL was also given.  She was started on Lagevrio.  Hospitalist was asked to admit patient for further evaluation and management.  Review of Systems: Review of systems as noted in the  HPI. All other systems reviewed and are negative.   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 ;magnetic MRI->  multiple cerebral infarctions; Rx-ASA but subsequently changed to coumadin  when found to have rheumatic mitral valve disease carotid duplex mild plaque in 08/2008   Chronic bronchitis    Chronic renal insufficiency    baseline creatine 1.4; 1.04 in 03/2010   DJD (degenerative joint disease)    hands, knees   Gout    Hyperlipidemia    Hypertension    heart disease diastolic dysfunction ; VQ-25%; mild pulmonary edema in 2006;responded to diurectics ; LVH   Kidney stones    Mitral valve disease    Severe mitral annular calcification; mild to moderate stenosis-not clearly rheumatic   Nephrolithiasis    Pacemaker    Retinal hemorrhage    Seizure disorder (HCC)    Shortness of breath    only with exertion   Stroke ([place])    Tachycardia-bradycardia syndrome ([place])    with pauses and syncope;PPM implantation 10/2008,negative stress nuclear study 03/2005   Vertigo    Past Surgical History:  Procedure Laterality Date   CATARACT EXTRACTION W/PHACO  03/29/2011   Procedure: CATARACT EXTRACTION PHACO AND INTRAOCULAR LENS PLACEMENT ([place]);  Surgeon:  Tonny Branch;  Location: [facility];  Service: Ophthalmology;  Laterality: Right;  CDE 12.62   INSERT / REPLACE / REMOVE PACEMAKER  11/2008   [employer] DUAL chamber pacemaker 11/06/2008   MEMBRANE PEEL Right 02/12/2014   Procedure: MEMBRANE PEEL;  Surgeon: [name], MD;  Location: [place];  Service: Ophthalmology;  Laterality: Right;   PARS PLANA VITRECTOMY Right 02/12/2014   Procedure: PARS PLANA VITRECTOMY WITH  25 GAUGE;  Surgeon: [name], MD;  Location: [place];  Service: Ophthalmology;  Laterality: Right;   PPM GENERATOR CHANGEOUT N/A 03/17/2020   Procedure: PPM GENERATOR CHANGEOUT;  Surgeon: Deboraha Sprang, MD;  Location: [place] [facility];  Service: Cardiovascular;  Laterality: N/A;   TOTAL KNEE ARTHROPLASTY  04/2009   Right    Social History:  reports that she has never smoked. She has never used smokeless tobacco. She reports that she does not drink alcohol and does not use drugs.   Allergies  Allergen Reactions   Tramadol     Drowsiness    Family History  Problem Relation Age of Onset   Cancer Other        unknown cancer   Hypotension Neg Hx    Anesthesia problems Neg Hx    Pseudochol deficiency Neg Hx    Malignant hyperthermia Neg Hx      Prior to Admission medications   Medication Sig Start Date End Date Taking? Authorizing Provider  albuterol (VENTOLIN HFA) 108 (90 Base) MCG/ACT inhaler TAKE 2 PUFFS EVERY 6 HOURS AS NEEDED Patient taking differently: Inhale 2 puffs into the lungs every 6 (six) hours as needed for wheezing or shortness of breath. TAKE 2 PUFFS EVERY 6 HOURS AS NEEDED 10/23/20  Yes Luking, Elayne Snare, MD  diltiazem (CARDIZEM CD) 360 MG 24 hr capsule Take 360 mg by mouth daily. 02/15/22  Yes [provider]  OXYGEN Inhale 2 L into the lungs continuous.   Yes [provider]  pravastatin (PRAVACHOL) 80 MG tablet TAKE (1) TABLET BY MOUTH AT BEDTIME. Patient taking differently: Take 80 mg by mouth at bedtime. TAKE (1) TABLET BY MOUTH AT BEDTIME. 05/21/20  Yes Luking, Elayne Snare, MD  prednisoLONE acetate (PRED FORTE) 1 % ophthalmic suspension Place 1 drop into the right eye 4 (four) times daily. 02/22/22  Yes [provider]  SENNA-PLUS 8.6-50 MG tablet Take by mouth 2 (two) times daily. 11/23/21  Yes [provider]  torsemide (DEMADEX) 20 MG tablet TAKE 3 TABLETS BY MOUTH DAILY Patient taking differently: Take 20 mg by mouth daily.  08/03/21  Yes Luking, Scott A, MD  Travoprost, BAK Free, (TRAVATAN) 0.004 % SOLN ophthalmic solution Place 1 drop into both eyes at bedtime.    Yes [provider]  ciprofloxacin (CILOXAN) 0.3 % ophthalmic solution  01/11/22   [provider]  diltiazem (TIAZAC) 360 MG 24 hr capsule Take 1 capsule (360 mg total) by mouth daily. Patient not taking: Reported on 03/02/2022 08/03/21   Kathyrn Drown, MD  mupirocin ointment (BACTROBAN) 2 % Apply thin amount daily to skin blister Patient not taking: Reported on 03/02/2022 07/14/20   Kathyrn Drown, MD    Physical Exam: BP (!) 182/58   Pulse 77   Temp 99.3 F (37.4 C) (Oral)   Resp 20   Ht 5\' 2"  (1.575 m)   Wt 78.9 kg   SpO2 100%   BMI 31.83 kg/m   General: 88 y.o. year-old female well developed well nourished in no  acute distress.  Alert and oriented x 2 (person and place). HEENT: NCAT, EOMI Neck: Supple, trachea medial Cardiovascular: Tachycardia.  Irregular rate and rhythm with no rubs or gallops.  No thyromegaly or JVD noted.  No lower extremity edema. 2/4 pulses in all 4 extremities. Respiratory: Bilateral Rales in lower lobes on auscultation.  No wheezes  Abdomen: Soft, nontender nondistended with normal bowel sounds x4 quadrants. Muskuloskeletal: No cyanosis, clubbing or edema noted bilaterally Neuro: CN II-XII intact, sensation, reflexes intact Skin: No ulcerative lesions noted or rashes Psychiatry: Mood is appropriate for condition and setting          Labs on Admission:  Basic Metabolic Panel: Recent Labs  Lab 03/02/22 2013  NA 138  K 4.8  CL 108  CO2 22  GLUCOSE 101*  BUN 35*  CREATININE 2.20*  CALCIUM 10.0   Liver Function Tests: Recent Labs  Lab 03/02/22 2013  AST 17  ALT 11  ALKPHOS 85  BILITOT 0.5  PROT 7.8  ALBUMIN 4.5   No results for input(s): "LIPASE", "AMYLASE" in the last 168 hours. Recent Labs  Lab 03/02/22 1945  AMMONIA 13   CBC: Recent Labs  Lab 03/02/22 2013  WBC 8.9   NEUTROABS 7.4  HGB 11.0*  HCT 35.3*  MCV 92.2  PLT 197   Cardiac Enzymes: No results for input(s): "CKTOTAL", "CKMB", "CKMBINDEX", "TROPONINI" in the last 168 hours.  BNP (last 3 results) Recent Labs    03/02/22 2050  BNP 965.0*    ProBNP (last 3 results) No results for input(s): "PROBNP" in the last 8760 hours.  CBG: Recent Labs  Lab 03/02/22 1925  GLUCAP 91    Radiological Exams on Admission: DG Chest Port 1 View  Result Date: 03/02/2022 CLINICAL DATA:  Altered mental status, syncope EXAM: PORTABLE CHEST 1 VIEW COMPARISON:  11/21/2020 FINDINGS: The lungs are symmetrically well expanded. Small bilateral pleural effusions are present, left greater than right. Mild perihilar interstitial pulmonary infiltrate is present, most in keeping with mild cardiogenic pulmonary edema. No pneumothorax. Cardiac size is mildly enlarged, unchanged. Left subclavian dual lead pacemaker is unchanged. Advanced atherosclerotic calcification is seen within the thoracic aorta. No acute bone abnormality. Osseous structures are age-appropriate. IMPRESSION: Mild cardiogenic pulmonary edema with small bilateral pleural effusions. Stable cardiomegaly Electronically Signed   By: Fidela Salisbury M.D.   On: 03/02/2022 20:17   CT Head Wo Contrast  Result Date: 03/02/2022 CLINICAL DATA:  Altered mental status, lethargy EXAM: CT HEAD WITHOUT CONTRAST TECHNIQUE: Contiguous axial images were obtained from the base of the skull through the vertex without intravenous contrast. RADIATION DOSE REDUCTION: This exam was performed according to the departmental dose-optimization program which includes automated exposure control, adjustment of the mA and/or kV according to patient size and/or use of iterative reconstruction technique. COMPARISON:  05/02/2020 FINDINGS: Brain: Normal anatomic configuration. Parenchymal volume loss is commensurate with the patient's age. Moderate periventricular white matter changes are present  likely reflecting the sequela of small vessel ischemia. 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 No abnormal intra or extra-axial mass lesion or fluid collection. No abnormal mass effect or midline shift. No evidence of acute intracranial hemorrhage or infarct. Ventricular size is normal. Cerebellum unremarkable. Vascular: No asymmetric hyperdense vasculature at the skull base. Skull: Intact Sinuses/Orbits: Partially calcified soft tissue is seen within the visualized right maxillary sinus, incompletely evaluated on this examination. Thickening of the right maxillary walls again noted. Together, the findings may reflect changes of chronic  sinusitis. Remaining paranasal sinuses are clear. Orbits are unremarkable. Other: Mastoid air cells and middle ear cavities are clear. IMPRESSION: 1. No acute intracranial abnormality. 2. Stable senescent changes and remote infarcts. 3. Probable chronic right maxillary sinusitis. Electronically Signed   By: Fidela Salisbury M.D.   On: 03/02/2022 20:15    EKG: I independently viewed the EKG done and my findings are as followed: A-fib with RVR  Assessment/Plan Present on Admission:  COVID-19 virus infection  Elevated brain natriuretic peptide (BNP) level  Atrial fibrillation with RVR (HCC)  Acute on chronic diastolic CHF (congestive heart failure) (HCC)  Elevated troponin  Principal Problem:   COVID-19 virus infection Active Problems:   Mixed hyperlipidemia   Essential hypertension   Sick sinus syndrome (HCC)   Atrial fibrillation with RVR (HCC)   CKD (chronic kidney disease), stage IV (HCC)   Elevated troponin   Elevated brain natriuretic peptide (BNP) level   Acute on chronic diastolic CHF (congestive heart failure) (HCC)   Hypertensive emergency   Syncope   Obesity (BMI 30-39.9)  COVID-19 virus infection Continue albuterol q.6h Continue molnupiravir Continue vitamin-C 500 mg p.o. Daily Continue zinc 220  mg p.o. Daily Continue Mucinex, Robitussin  Continue Tylenol p.r.n. for fever Continue supplemental oxygen per home regimen  Continue incentive spirometry  Continue airborne isolation precaution Continue monitoring daily inflammatory markers  Acute exacerbation of CHF Elevated BNP 965 (this was 772 on 11/24/2020) Chest x-ray was suggestive of mild cardiogenic pulmonary edema with small bilateral pleural effusions Continue total input/output, daily weights and fluid restriction Continue IV Lasix 40 mg x 2 was given, continue IV Lasix 40 mg twice daily Continue heart healthy diet  Echocardiogram in the morning Cardiology will be consulted in the morning  Hypertensive emergency Essential hypertension Continue IV nitroglycerin drip Consider transitioning to oral meds when BP is better controlled  Elevated troponin possibly secondary to type II demand ischemia, rule out ACS Troponin x2 -52 > 121 EKG personally reviewed showed A-fib with RVR Patient was not complaining of chest pain at this time Patient will be started on heparin drip Continue to trend troponin Cardiology will be consulted in the morning  Altered mental status Patient was oriented to person and place only (different from baseline-alert and oriented x4)  ??  Syncope vs lethargy Patient was reported to be going in and out of consciousness PTA Continue telemetry and watch for arrhythmias Troponins x2 -52 > 121 continue to trend troponin EKG showed A-fib with RVR Echocardiogram will be done to rule out significant aortic stenosis or other outflow obstruction, and also to evaluate EF and to rule out segmental/Regional wall motion abnormalities.  Carotid artery Dopplers will be done to rule out hemodynamically significant stenosis  Chronic respiratory failure with hypoxia Continue home supplemental oxygen  CKD stage IV  BUN/creatinine 35/2.20 (creatinine was within baseline range) Renally adjust medications, avoid  nephrotoxic agents/dehydration/hypotension  Chronic atrial fibrillation Continue diltiazem  SSS status post pacemaker placement Stable, pacemaker was last reviewed remotely on 12/15/2021 and it was normal,  Battery status was good.  Lead measurements unchanged.  Histograms were appropriate  Mixed hyperlipidemia Continue Pravachol  Obesity (BMI 31.83) Diet and lifestyle modification   Echocardiogram done on 11/24/2020 IMPRESSIONS     1. Left ventricular ejection fraction, by estimation, is 70 to 75%. The  left ventricle has hyperdynamic function. Left ventricular endocardial  border not optimally defined to evaluate regional wall motion. There is  severe left ventricular hypertrophy.  Left ventricular diastolic parameters  are indeterminate.   2. Right ventricular systolic function is normal. The right ventricular  size is normal. There is moderately elevated pulmonary artery systolic  pressure.   3. Left atrial size was severely dilated.   4. A small pericardial effusion is present. The pericardial effusion is  circumferential. There is no evidence of cardiac tamponade.   5. The mitral valve is abnormal. Mild mitral valve regurgitation. Severe  mitral stenosis. Severe mitral annular calcification. The mean mitral  valve gradient is 13.0 mmHg, HR 89 bpm   6. The tricuspid valve is abnormal. Tricuspid valve regurgitation is  moderate.   7. The aortic valve is tricuspid. There is mild calcification of the  aortic valve. There is mild thickening of the aortic valve. Aortic valve  regurgitation is not visualized. No aortic stenosis is present.   8. Right atrial size was moderately dilated.   9. The inferior vena cava is normal in size with greater than 50%  respiratory variability, suggesting right atrial pressure of 3 mmHg.  10. Moderate pulmonary HTN, PASP is 58 mmHg.    DVT prophylaxis: Heparin drip  Code Status: Full code  Consults: Cardiology  Family Communication: None  at bedside  Severity of Illness: The appropriate patient status for this patient is INPATIENT. Inpatient status is judged to be reasonable and necessary in order to provide the required intensity of service to ensure the patient's safety. The patient's presenting symptoms, physical exam findings, and initial radiographic and laboratory data in the context of their chronic comorbidities is felt to place them at high risk for further clinical deterioration. Furthermore, it is not anticipated that the patient will be medically stable for discharge from the hospital within 2 midnights of admission.   * I certify that at the point of admission it is my clinical judgment that the patient will require inpatient hospital care spanning beyond 2 midnights from the point of admission due to high intensity of service, high risk for further deterioration and high frequency of surveillance required.*  Author: Bernadette Hoit, DO 03/03/2022 5:19 AM  For on call review [email or web address].

## 2022-03-03 NOTE — Progress Notes (Signed)
[name] is a 88 y.o. female with medical history significant of SSS status post pacemaker placement, hypertension, CKD stage IV, chronic atrial fibrillation, Hyperlipidemia, COPD who presents to the emergency department via EMS due to altered mental status.  She was also noted to have some unsteadiness with ambulation.  Chest x-ray demonstrated cardiogenic pulmonary edema with troponin elevation.  She was started on nitroglycerin IV drip as well as heparin drip on account of her troponin elevation.  She was seen by cardiology and heparin drip is not indicated at this time as she appears to have more of a demand ischemia and denies any other chest pain.  2D echocardiogram ordered and currently pending.  PT evaluation ordered as well.  She has been started on treatment for COVID infection as she is COVID positive.  She has also been started on diuresis with IV Lasix due to acute CHF exacerbation.  Patient seen and evaluated at bedside in the ED and has been admitted after midnight.  Total care time: 30 minutes.

## 2022-03-03 NOTE — ED Notes (Signed)
Pt vomited x1.  Sts she thinks a pill from earlier got stuck in her throat.  Pt currently denying complaints.

## 2022-03-03 NOTE — Progress Notes (Signed)
ANTICOAGULATION CONSULT NOTE - Initial Consult  Pharmacy Consult for Heparin  Indication: chest pain/ACS  Allergies  Allergen Reactions   Tramadol     Drowsiness    Patient Measurements: Height: 5\' 2"  (157.5 cm) Weight: 78.9 kg (174 lb) IBW/kg (Calculated) : 50.1  Vital Signs: Temp: 99 F (37.2 C) (09/27 0608) Temp Source: Oral (09/27 0608) BP: 183/68 (09/27 0600) Pulse Rate: 85 (09/27 0600)  Labs: Recent Labs    03/02/22 2013 03/02/22 2050 03/02/22 2315  HGB 11.0*  --   --   HCT 35.3*  --   --   PLT 197  --   --   CREATININE 2.20*  --   --   TROPONINIHS  --  52* 121*    Estimated Creatinine Clearance: 15.5 mL/min (A) (by C-G formula based on SCr of 2.2 mg/dL (H)).   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 ;magnetic MRI->  multiple cerebral infarctions; Rx-ASA but subsequently changed to coumadin  when found to have rheumatic mitral valve disease carotid duplex mild plaque in 08/2008   Chronic bronchitis    Chronic renal insufficiency    baseline creatine 1.4; 1.04 in 03/2010   DJD (degenerative joint disease)    hands, knees   Gout    Hyperlipidemia    Hypertension    heart disease diastolic dysfunction ; QA-83%; mild pulmonary edema in 2006;responded to diurectics ; LVH   Kidney stones    Mitral valve disease    Severe mitral annular calcification; mild to moderate stenosis-not clearly rheumatic   Nephrolithiasis    Pacemaker    Retinal hemorrhage    Seizure disorder (HCC)    Shortness of breath    only with exertion   Stroke ([place])    Tachycardia-bradycardia syndrome ([place])    with pauses and  syncope;PPM implantation 10/2008,negative stress nuclear study 03/2005   Vertigo       Assessment: 88 y/o F with COVID, found to have elevated troponin, starting heparin for now, Hgb 11, Noted renal dysfunction, PTA meds reviewed.   Goal of Therapy:  Heparin level 0.3-0.7 units/ml Monitor platelets by anticoagulation protocol: Yes   Plan:  Heparin 3000 units BOLUS Start heparin drip at 800 units/hr 1400 Heparin level Daily CBC/Heparin level Monitor for bleeding  Narda Bonds, PharmD, BCPS Clinical Pharmacist Phone: [phone]

## 2022-03-03 NOTE — Consult Note (Signed)
Cardiology Consultation   Patient ID: [name] MRN: [id number]; DOB: 10/16/1926  Admit date: 03/02/2022 Date of Consult: 03/03/2022  PCP:  [name], [place] Providers Cardiologist:  None  Electrophysiologist:  Cristopher Peru, MD       Patient Profile:   [name] is a 88 y.o. female with a hx of pacemaker who is being seen 03/03/2022 for the evaluation of elevated troponin at the request of Dr. Manuella Ghazi.  History of Present Illness:   [name] is a 88 year old female with history of pacemaker chronic diastolic heart failure, chronic atrial fibrillation with COPD presenting with lethargy altered mental status.  Lives with her elderly husband.  Had several) intermittent episodes of obtundation.  Blood pressure was elevated on arrival 219/87 satting 99% on room air.  Troponin elevated from 52-1 21.  COVID-positive.  Chest x-ray showed mild cardiogenic pulmonary edema with small bilateral pleural effusions.  CT of head normal.  Treated originally in the ER with nitroglycerin patch IV labetalol without much improvement.  Lasix 40 mg x 2 was given.  In addition IV hydration was also given.  She was given appropriate COVID medication.  Prior EF was hyperdynamic 75 to 80%  Currently she is pleasant, smiling.  Understands her orientation to place as well as name.  Denies any chest pain, no significant shortness of breath at this time she states.  She recalls being weak at home.   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 ;magnetic MRI->  multiple cerebral infarctions; Rx-ASA but subsequently changed to coumadin   when found to have rheumatic mitral valve disease carotid duplex mild plaque in 08/2008   Chronic bronchitis    Chronic renal insufficiency    baseline creatine 1.4; 1.04 in 03/2010   DJD (degenerative joint disease)    hands, knees   Gout    Hyperlipidemia    Hypertension    heart disease diastolic dysfunction ; VH-84%; mild pulmonary edema in 2006;responded to diurectics ; LVH   Kidney stones    Mitral valve disease    Severe mitral annular calcification; mild to moderate stenosis-not clearly rheumatic   Nephrolithiasis    Pacemaker    Retinal hemorrhage    Seizure disorder (HCC)    Shortness of breath    only with exertion   Stroke ([place])    Tachycardia-bradycardia syndrome ([place])    with pauses and syncope;PPM implantation 10/2008,negative stress nuclear study 03/2005   Vertigo     Past Surgical History:  Procedure Laterality Date   CATARACT EXTRACTION W/PHACO  03/29/2011   Procedure: CATARACT EXTRACTION PHACO AND INTRAOCULAR LENS PLACEMENT ([place]);  Surgeon: Tonny Branch;  Location: [facility];  Service: Ophthalmology;  Laterality: Right;  CDE 12.62   INSERT / REPLACE / REMOVE PACEMAKER  11/2008   St.Jude DUAL chamber pacemaker 11/06/2008   MEMBRANE PEEL Right 02/12/2014   Procedure: MEMBRANE PEEL;  Surgeon: [name], MD;  Location: [place];  Service: Ophthalmology;  Laterality: Right;   PARS PLANA VITRECTOMY Right 02/12/2014   Procedure: PARS PLANA VITRECTOMY WITH 25 GAUGE;  Surgeon: Chrystie Nose  Zigmund Daniel, MD;  Location: [place];  Service: Ophthalmology;  Laterality: Right;   PPM GENERATOR CHANGEOUT N/A 03/17/2020   Procedure: PPM GENERATOR CHANGEOUT;  Surgeon: Deboraha Sprang, MD;  Location: [place] [facility];  Service: Cardiovascular;  Laterality: N/A;   TOTAL KNEE ARTHROPLASTY  04/2009   Right     Home Medications:  Prior to Admission medications   Medication Sig Start Date End Date Taking? Authorizing Provider  albuterol (VENTOLIN HFA) 108 (90 Base) MCG/ACT inhaler TAKE 2 PUFFS EVERY  6 HOURS AS NEEDED Patient taking differently: Inhale 2 puffs into the lungs every 6 (six) hours as needed for wheezing or shortness of breath. TAKE 2 PUFFS EVERY 6 HOURS AS NEEDED 10/23/20  Yes Luking, Elayne Snare, MD  diltiazem (CARDIZEM CD) 360 MG 24 hr capsule Take 360 mg by mouth daily. 02/15/22  Yes [provider]  OXYGEN Inhale 2 L into the lungs continuous.   Yes [provider]  pravastatin (PRAVACHOL) 80 MG tablet TAKE (1) TABLET BY MOUTH AT BEDTIME. Patient taking differently: Take 80 mg by mouth at bedtime. TAKE (1) TABLET BY MOUTH AT BEDTIME. 05/21/20  Yes Luking, Elayne Snare, MD  prednisoLONE acetate (PRED FORTE) 1 % ophthalmic suspension Place 1 drop into the right eye 4 (four) times daily. 02/22/22  Yes [provider]  SENNA-PLUS 8.6-50 MG tablet Take by mouth 2 (two) times daily. 11/23/21  Yes [provider]  torsemide (DEMADEX) 20 MG tablet TAKE 3 TABLETS BY MOUTH DAILY Patient taking differently: Take 20 mg by mouth daily. 08/03/21  Yes Luking, Scott A, MD  Travoprost, BAK Free, (TRAVATAN) 0.004 % SOLN ophthalmic solution Place 1 drop into both eyes at bedtime.    Yes [provider]  ciprofloxacin (CILOXAN) 0.3 % ophthalmic solution  01/11/22   [provider]  diltiazem (TIAZAC) 360 MG 24 hr capsule Take 1 capsule (360 mg total) by mouth daily. Patient not taking: Reported on 03/02/2022 08/03/21   Meghan Drown, MD  mupirocin ointment (BACTROBAN) 2 % Apply thin amount daily to skin blister Patient not taking: Reported on 03/02/2022 07/14/20   Meghan Drown, MD    Inpatient Medications: Scheduled Meds:  vitamin C  500 mg Oral Daily   dextromethorphan-guaiFENesin  1 tablet Oral BID   diltiazem  360 mg Oral Daily   furosemide  40 mg Intravenous Q12H   molnupiravir EUA  4 capsule Oral BID   pantoprazole (PROTONIX) IV  40 mg Intravenous QHS   pravastatin  80 mg Oral q1800   zinc sulfate  220 mg Oral Daily   Continuous Infusions:   heparin 800 Units/hr (03/03/22 0606)   nitroGLYCERIN 30 mcg/min (03/03/22 0652)   PRN Meds: albuterol, guaiFENesin-dextromethorphan, ondansetron **OR** ondansetron (ZOFRAN) IV  Allergies:    Allergies  Allergen Reactions   Tramadol     Drowsiness    Social History:   Social History   Socioeconomic History   Marital status: Married    Spouse name: Administrator, arts   Number of children: Not on file   Years of education: Not on file   Highest education level: Not on file  Occupational History   Not on file  Tobacco Use   Smoking status: Never   Smokeless tobacco: Never  Vaping Use   Vaping Use: Never used  Substance and Sexual Activity   Alcohol use: No    Alcohol/week: 0.0 standard drinks of alcohol   Drug use: No   Sexual activity: Not Currently  Other  Topics Concern   Not on file  Social History Narrative   Lives with husband, Tonna Boehringer.   Daughter [name], lives nearby and helps with care.   Social Determinants of Health   Financial Resource Strain: Low Risk  (03/03/2021)   Overall Financial Resource Strain (CARDIA)    Difficulty of Paying Living Expenses: Not hard at all  Food Insecurity: No Food Insecurity (03/03/2021)   Hunger Vital Sign    Worried About Running Out of Food in the Last Year: Never true    Ran Out of Food in the Last Year: Never true  Transportation Needs: No Transportation Needs (03/03/2021)   PRAPARE - Hydrologist (Medical): No    Lack of Transportation (Non-Medical): No  Physical Activity: Inactive (03/03/2021)   Exercise Vital Sign    Days of Exercise per Week: 0 days    Minutes of Exercise per Session: 0 min  Stress: No Stress Concern Present (03/03/2021)   [place]    Feeling of Stress : Not at all  Social Connections: Moderately Isolated (03/03/2021)   Social Connection and Isolation Panel [NHANES]    Frequency of Communication with Friends and Family:  More than three times a week    Frequency of Social Gatherings with Friends and Family: More than three times a week    Attends Religious Services: Never    Marine scientist or Organizations: No    Attends Archivist Meetings: Never    Marital Status: Married  Human resources officer Violence: Not At Risk (03/03/2021)   Humiliation, Afraid, Rape, and Kick questionnaire    Fear of Current or Ex-Partner: No    Emotionally Abused: No    Physically Abused: No    Sexually Abused: No    Family History:    Family History  Problem Relation Age of Onset   Cancer Other        unknown cancer   Hypotension Neg Hx    Anesthesia problems Neg Hx    Pseudochol deficiency Neg Hx    Malignant hyperthermia Neg Hx      ROS:  Please see the history of present illness.   All other ROS reviewed and negative.     Physical Exam/Data:   Vitals:   03/03/22 0800 03/03/22 0815 03/03/22 0830 03/03/22 0845  BP: (!) 186/68 (!) 179/86 (!) 176/80 (!) 193/82  Pulse: 95 80 90 91  Resp: (!) 24 20 (!) 21 (!) 23  Temp:      TempSrc:      SpO2: 99% 100% 100% 100%  Weight:      Height:       No intake or output data in the 24 hours ending 03/03/22 0851    03/02/2022    6:49 PM 03/03/2021   11:06 AM 11/21/2020    8:49 PM  Last 3 Weights  Weight (lbs) 174 lb 163 lb 160 lb  Weight (kg) 78.926 kg 73.936 kg 72.576 kg     Body mass index is 31.83 kg/m.  General: Elderly in no acute distress, resting comfortably HEENT: normal Neck: no JVD Vascular: No carotid bruits; Distal pulses 2+ bilaterally Cardiac:  normal S1, S2; RRR; 2/6 holosystolic murmur at apex Lungs: No significant rales Abd: soft, nontender, no hepatomegaly  Ext: no edema Musculoskeletal:  No deformities, BUE and BLE strength normal and equal Skin: warm and dry  Neuro:  CNs 2-12 intact, no focal abnormalities noted Psych: Blunted  affect  EKG:  The EKG was personally reviewed and demonstrates: 03/02/2022 A-fib 109 with no other  abnormalities no ischemic changes.  Compatible with prior.  Telemetry:  Telemetry was personally reviewed and demonstrates: Atrial fibrillation  Relevant CV Studies:  Echocardiogram 11/22/2020:   1. Left ventricular ejection fraction, by estimation, is 70 to 75%. The  left ventricle has hyperdynamic function. Left ventricular endocardial  border not optimally defined to evaluate regional wall motion. There is  severe left ventricular hypertrophy.  Left ventricular diastolic parameters are indeterminate.   2. Right ventricular systolic function is normal. The right ventricular  size is normal. There is moderately elevated pulmonary artery systolic  pressure.   3. Left atrial size was severely dilated.   4. A small pericardial effusion is present. The pericardial effusion is  circumferential. There is no evidence of cardiac tamponade.   5. The mitral valve is abnormal. Mild mitral valve regurgitation. Severe  mitral stenosis. Severe mitral annular calcification. The mean mitral  valve gradient is 13.0 mmHg, HR 89 bpm   6. The tricuspid valve is abnormal. Tricuspid valve regurgitation is  moderate.   7. The aortic valve is tricuspid. There is mild calcification of the  aortic valve. There is mild thickening of the aortic valve. Aortic valve  regurgitation is not visualized. No aortic stenosis is present.   8. Right atrial size was moderately dilated.   9. The inferior vena cava is normal in size with greater than 50%  respiratory variability, suggesting right atrial pressure of 3 mmHg.  10. Moderate pulmonary HTN, PASP is 58 mmHg.   Laboratory Data:  High Sensitivity Troponin:   Recent Labs  Lab 03/02/22 2050 03/02/22 2315 03/03/22 0748  TROPONINIHS 52* 121* 251*     Chemistry Recent Labs  Lab 03/02/22 2013  NA 138  K 4.8  CL 108  CO2 22  GLUCOSE 101*  BUN 35*  CREATININE 2.20*  CALCIUM 10.0  GFRNONAA 20*  ANIONGAP 8    Recent Labs  Lab 03/02/22 2013  PROT 7.8   ALBUMIN 4.5  AST 17  ALT 11  ALKPHOS 85  BILITOT 0.5   Lipids No results for input(s): "CHOL", "TRIG", "HDL", "LABVLDL", "LDLCALC", "CHOLHDL" in the last 168 hours.  Hematology Recent Labs  Lab 03/02/22 2013  WBC 8.9  RBC 3.83*  HGB 11.0*  HCT 35.3*  MCV 92.2  MCH 28.7  MCHC 31.2  RDW 13.5  PLT 197   Thyroid No results for input(s): "TSH", "FREET4" in the last 168 hours.  BNP Recent Labs  Lab 03/02/22 2050  BNP 965.0*    DDimer No results for input(s): "DDIMER" in the last 168 hours.   Radiology/Studies:  DG Chest Port 1 View  Result Date: 03/02/2022 CLINICAL DATA:  Altered mental status, syncope EXAM: PORTABLE CHEST 1 VIEW COMPARISON:  11/21/2020 FINDINGS: The lungs are symmetrically well expanded. Small bilateral pleural effusions are present, left greater than right. Mild perihilar interstitial pulmonary infiltrate is present, most in keeping with mild cardiogenic pulmonary edema. No pneumothorax. Cardiac size is mildly enlarged, unchanged. Left subclavian dual lead pacemaker is unchanged. Advanced atherosclerotic calcification is seen within the thoracic aorta. No acute bone abnormality. Osseous structures are age-appropriate. IMPRESSION: Mild cardiogenic pulmonary edema with small bilateral pleural effusions. Stable cardiomegaly Electronically Signed   By: Fidela Salisbury M.D.   On: 03/02/2022 20:17   CT Head Wo Contrast  Result Date: 03/02/2022 CLINICAL DATA:  Altered mental status, lethargy EXAM: CT HEAD WITHOUT CONTRAST TECHNIQUE:  Contiguous axial images were obtained from the base of the skull through the vertex without intravenous contrast. RADIATION DOSE REDUCTION: This exam was performed according to the departmental dose-optimization program which includes automated exposure control, adjustment of the mA and/or kV according to patient size and/or use of iterative reconstruction technique. COMPARISON:  05/02/2020 FINDINGS: Brain: Normal anatomic configuration.  Parenchymal volume loss is commensurate with the patient's age. Moderate periventricular white matter changes are present likely reflecting the sequela of small vessel ischemia. 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 No abnormal intra or extra-axial mass lesion or fluid collection. No abnormal mass effect or midline shift. No evidence of acute intracranial hemorrhage or infarct. Ventricular size is normal. Cerebellum unremarkable. Vascular: No asymmetric hyperdense vasculature at the skull base. Skull: Intact Sinuses/Orbits: Partially calcified soft tissue is seen within the visualized right maxillary sinus, incompletely evaluated on this examination. Thickening of the right maxillary walls again noted. Together, the findings may reflect changes of chronic sinusitis. Remaining paranasal sinuses are clear. Orbits are unremarkable. Other: Mastoid air cells and middle ear cavities are clear. IMPRESSION: 1. No acute intracranial abnormality. 2. Stable senescent changes and remote infarcts. 3. Probable chronic right maxillary sinusitis. Electronically Signed   By: Fidela Salisbury M.D.   On: 03/02/2022 20:15     Assessment and Plan:   88 year old female COVID-positive pneumonia with lethargy hypertensive urgency respiratory failure chronic kidney disease stage IV chronic atrial fibrillation pacemaker hyperlipidemia BNP 965.  Troponin was elevated from 50 up to 120, 221 mild elevation.  Type II myocardial infarction (supply/demand mismatch in the setting of underlying viral infection/hypertensive urgency) -Elevated troponin mild in the setting of underlying viral pneumonia infection from COVID.  Not acute coronary syndrome.  Would not recommend IV heparin, cardiac rehab etc. -Agree with echocardiogram to reassess.  Previously EF reassuring hyperdynamic. -Hypertensive urgency also playing a role in elevated troponin.  Continue to treat.  Blood pressure improved  with IV nitroglycerin.  Continue to treat underlying illness.  Chronic diastolic heart failure - Chest x-ray suggestive of mild cardiogenic pulmonary edema IV Lasix was administered yesterday.  Echocardiogram pending.  This could be secondary to underlying viral infection. -Volume management has been determined by nephrology given her chronic kidney disease stage IV.  At home she has been taking 60 mg of Demadex daily.    COVID infection -Lethargy etc.  Altered mental status.  Medications per history and physical.  Chronic kidney disease stage IV - Baseline creatinine 2.2.  Within range.  Permanent atrial fibrillation - Currently on diltiazem 360 once a day. -Prior discussion with Dr. Yevette Edwards. Caryl Comes, anticoagulation was not resumed in July given advanced age and bleeding risk.  Pacemaker - Stable.  Working appropriately.  Claude pacemaker with generator change out 2021.  Mitral stenosis - Associated as well with mild mitral regurgitation.  Repeating echocardiogram.  Resume diuretic when felt comfortable from infection standpoint.  Risk Assessment/Risk Scores:           For questions or updates, please contact [place] Please consult [email or web address] for contact info under    Signed, Candee Furbish, MD  03/03/2022 8:51 AM

## 2022-03-04 DIAGNOSIS — U071 COVID-19: Secondary | ICD-10-CM | POA: Diagnosis not present

## 2022-03-04 DIAGNOSIS — R778 Other specified abnormalities of plasma proteins: Secondary | ICD-10-CM

## 2022-03-04 LAB — CBC WITH DIFFERENTIAL/PLATELET
Abs Immature Granulocytes: 0.03 10*3/uL (ref 0.00–0.07)
Basophils Absolute: 0 10*3/uL (ref 0.0–0.1)
Basophils Relative: 0 %
Eosinophils Absolute: 0.1 10*3/uL (ref 0.0–0.5)
Eosinophils Relative: 1 %
HCT: 30.5 % — ABNORMAL LOW (ref 36.0–46.0)
Hemoglobin: 9.8 g/dL — ABNORMAL LOW (ref 12.0–15.0)
Immature Granulocytes: 1 %
Lymphocytes Relative: 16 %
Lymphs Abs: 1 10*3/uL (ref 0.7–4.0)
MCH: 29.6 pg (ref 26.0–34.0)
MCHC: 32.1 g/dL (ref 30.0–36.0)
MCV: 92.1 fL (ref 80.0–100.0)
Monocytes Absolute: 1.2 10*3/uL — ABNORMAL HIGH (ref 0.1–1.0)
Monocytes Relative: 21 %
Neutro Abs: 3.6 10*3/uL (ref 1.7–7.7)
Neutrophils Relative %: 61 %
Platelets: 167 10*3/uL (ref 150–400)
RBC: 3.31 MIL/uL — ABNORMAL LOW (ref 3.87–5.11)
RDW: 13.2 % (ref 11.5–15.5)
WBC: 5.9 10*3/uL (ref 4.0–10.5)
nRBC: 0 % (ref 0.0–0.2)

## 2022-03-04 LAB — COMPREHENSIVE METABOLIC PANEL
ALT: 10 U/L (ref 0–44)
AST: 16 U/L (ref 15–41)
Albumin: 3.3 g/dL — ABNORMAL LOW (ref 3.5–5.0)
Alkaline Phosphatase: 70 U/L (ref 38–126)
Anion gap: 7 (ref 5–15)
BUN: 44 mg/dL — ABNORMAL HIGH (ref 8–23)
CO2: 23 mmol/L (ref 22–32)
Calcium: 9.1 mg/dL (ref 8.9–10.3)
Chloride: 105 mmol/L (ref 98–111)
Creatinine, Ser: 2.43 mg/dL — ABNORMAL HIGH (ref 0.44–1.00)
GFR, Estimated: 18 mL/min — ABNORMAL LOW (ref >60)
Glucose, Bld: 110 mg/dL — ABNORMAL HIGH (ref 70–99)
Potassium: 4.1 mmol/L (ref 3.5–5.1)
Sodium: 135 mmol/L (ref 135–145)
Total Bilirubin: 0.9 mg/dL (ref 0.3–1.2)
Total Protein: 6.1 g/dL — ABNORMAL LOW (ref 6.5–8.1)

## 2022-03-04 LAB — FERRITIN: Ferritin: 168 ng/mL (ref 11–307)

## 2022-03-04 LAB — MAGNESIUM: Magnesium: 1.9 mg/dL (ref 1.7–2.4)

## 2022-03-04 LAB — C-REACTIVE PROTEIN: CRP: 2.8 mg/dL — ABNORMAL HIGH (ref <1.0)

## 2022-03-04 LAB — D-DIMER, QUANTITATIVE: D-Dimer, Quant: 2.01 ug/mL-FEU — ABNORMAL HIGH (ref 0.00–0.50)

## 2022-03-04 LAB — PHOSPHORUS: Phosphorus: 3.6 mg/dL (ref 2.5–4.6)

## 2022-03-04 MED ORDER — CLONIDINE HCL 0.1 MG PO TABS
0.1000 mg | ORAL_TABLET | Freq: Once | ORAL | Status: AC
  Administered 2022-03-04: 0.1 mg via ORAL
  Filled 2022-03-04: qty 1

## 2022-03-04 MED ORDER — INFLUENZA VAC A&B SA ADJ QUAD 0.5 ML IM PRSY: 0.5000 mL | PREFILLED_SYRINGE | INTRAMUSCULAR | Status: DC

## 2022-03-04 MED ORDER — CARVEDILOL 3.125 MG PO TABS
3.1250 mg | ORAL_TABLET | Freq: Two times a day (BID) | ORAL | Status: DC
  Administered 2022-03-04 – 2022-03-05 (×2): 3.125 mg via ORAL
  Filled 2022-03-04 (×2): qty 1

## 2022-03-04 MED ORDER — AMLODIPINE BESYLATE 5 MG PO TABS: 5.0000 mg | ORAL_TABLET | Freq: Every day | ORAL | Status: DC

## 2022-03-04 MED ORDER — SENNOSIDES-DOCUSATE SODIUM 8.6-50 MG PO TABS
2.0000 | ORAL_TABLET | Freq: Two times a day (BID) | ORAL | Status: DC
  Administered 2022-03-04 – 2022-03-05 (×3): 2 via ORAL
  Filled 2022-03-04 (×3): qty 2

## 2022-03-04 MED ORDER — HYDRALAZINE HCL 25 MG PO TABS
50.0000 mg | ORAL_TABLET | Freq: Three times a day (TID) | ORAL | Status: DC
  Administered 2022-03-04: 50 mg via ORAL
  Filled 2022-03-04: qty 2

## 2022-03-04 MED ORDER — HYDRALAZINE HCL 25 MG PO TABS
50.0000 mg | ORAL_TABLET | Freq: Three times a day (TID) | ORAL | Status: DC
  Administered 2022-03-04 (×2): 50 mg via ORAL
  Filled 2022-03-04 (×2): qty 2

## 2022-03-04 NOTE — Evaluation (Signed)
Physical Therapy Evaluation Patient Details Name: [name] MRN: [id number] DOB: Apr 17, 1927 Today's Date: 03/04/2022  History of Present Illness  [name] is a 88 y.o. female with medical history significant of SSS status post pacemaker placement, hypertension, CKD stage IV, chronic atrial fibrillation, Hyperlipidemia, COPD who presents to the emergency department via EMS due to altered mental status.  Patient lives with elderly husband, she was unable to provide a history possibly due to the altered mental status, history was obtained from ED physician and ED medical record.  Per report, patient had several episodes of intermittent loss of consciousness today, she was also noted to be confused and unsteady on trying to ambulate.  Patient denies headache, chest pain, fever, chills, nausea or vomiting.   Clinical Impression  Patient presents up in chair (assisted by nursing staff) and agreeable for therapy.  Patient demonstrates good return for getting into/out of bed with slightly labored movement, requires verbal/tactile cueing for proper hand placement during sit to stands, slightly labored movement when ambulating in room without loss of balance and limited mostly due to fatigue.  Patient tolerated sitting up in chair after therapy with her son present in room.  Patient will benefit from continued skilled physical therapy in hospital and recommended venue below to increase strength, balance, endurance for safe ADLs and gait.        Recommendations for follow up therapy are one component of a multi-disciplinary discharge planning process, led by the attending physician.  Recommendations may be updated based on patient status, additional functional criteria and insurance authorization.  Follow Up Recommendations Home health PT      Assistance Recommended at Discharge Set up Supervision/Assistance  Patient can return home with the following  A little help with walking and/or  transfers;A little help with bathing/dressing/bathroom;Help with stairs or ramp for entrance;Assistance with cooking/housework    Equipment Recommendations None recommended by PT  Recommendations for Other Services       Functional Status Assessment Patient has had a recent decline in their functional status and demonstrates the ability to make significant improvements in function in a reasonable and predictable amount of time.     Precautions / Restrictions Precautions Precautions: Fall Restrictions Weight Bearing Restrictions: No      Mobility  Bed Mobility Overal bed mobility: Needs Assistance Bed Mobility: Supine to Sit, Sit to Supine     Supine to sit: Supervision, Modified independent (Device/Increase time) Sit to supine: Supervision   General bed mobility comments: slightly labored movement    Transfers Overall transfer level: Needs assistance Equipment used: Rolling walker (2 wheels) Transfers: Sit to/from Stand, Bed to chair/wheelchair/BSC Sit to Stand: Min guard   Step pivot transfers: Min guard       General transfer comment: increased time, labored movement, verbal/tactile cueing for proper hand placement during sit to stands    Ambulation/Gait Ambulation/Gait assistance: Min guard, Min assist Gait Distance (Feet): 20 Feet Assistive device: Rolling walker (2 wheels) Gait Pattern/deviations: Decreased step length - right, Decreased step length - left, Decreased stride length Gait velocity: decreased     General Gait Details: slightly labored slow cadence without loss of balance, limited mostly due to fatigue  Stairs            Wheelchair Mobility    Modified Rankin (Stroke Patients Only)       Balance Overall balance assessment: Needs assistance Sitting-balance support: Feet supported, No upper extremity supported Sitting balance-Leahy Scale: Good Sitting balance - Comments: seated at  EOB   Standing balance support: During functional  activity, Bilateral upper extremity supported Standing balance-Leahy Scale: Fair Standing balance comment: fair/good using RW                             Pertinent Vitals/Pain Pain Assessment Pain Assessment: No/denies pain    Home Living Family/patient expects to be discharged to:: Private residence Living Arrangements: Spouse/significant other;Children Available Help at Discharge: Family;Available 24 hours/day Type of Home: House Home Access: Stairs to enter Entrance Stairs-Rails: Right;Left;Can reach both Entrance Stairs-Number of Steps: 2   Home Layout: One level Home Equipment: Conservation officer, nature (2 wheels);Wheelchair - manual;BSC/3in1;Grab bars - tub/shower;Grab bars - toilet;Hand held shower head;Cane - single point;Shower seat - built in      Prior Function Prior Level of Function : Needs assist       Physical Assist : Mobility (physical);ADLs (physical) Mobility (physical): Bed mobility;Transfers;Gait;Stairs   Mobility Comments: [place] ambulator using RW ADLs Comments: Home aides 3x/week assist with baths, assisted by family     Hand Dominance   Dominant Hand: Right    Extremity/Trunk Assessment   Upper Extremity Assessment Upper Extremity Assessment: Generalized weakness    Lower Extremity Assessment Lower Extremity Assessment: Generalized weakness    Cervical / Trunk Assessment Cervical / Trunk Assessment: Normal  Communication   Communication: HOH  Cognition Arousal/Alertness: Awake/alert Behavior During Therapy: WFL for tasks assessed/performed Overall Cognitive Status: Within Functional Limits for tasks assessed                                          General Comments      Exercises     Assessment/Plan    PT Assessment Patient needs continued PT services  PT Problem List Decreased strength;Decreased activity tolerance;Decreased balance;Decreased mobility       PT Treatment Interventions DME  instruction;Gait training;Stair training;Functional mobility training;Therapeutic activities;Therapeutic exercise;Balance training;Patient/family education    PT Goals (Current goals can be found in the Care Plan section)  Acute Rehab PT Goals Patient Stated Goal: return home with family to assist PT Goal Formulation: With patient/family Time For Goal Achievement: 03/11/22 Potential to Achieve Goals: Good    Frequency Min 3X/week     Co-evaluation               AM-PAC PT "6 Clicks" Mobility  Outcome Measure Help needed turning from your back to your side while in a flat bed without using bedrails?: None Help needed moving from lying on your back to sitting on the side of a flat bed without using bedrails?: None Help needed moving to and from a bed to a chair (including a wheelchair)?: A Little Help needed standing up from a chair using your arms (e.g., wheelchair or bedside chair)?: A Little Help needed to walk in hospital room?: A Little Help needed climbing 3-5 steps with a railing? : A Lot 6 Click Score: 19    End of Session   Activity Tolerance: Patient tolerated treatment well;Patient limited by fatigue Patient left: in chair;with call bell/phone within reach;with family/visitor present Nurse Communication: Mobility status PT Visit Diagnosis: Unsteadiness on feet (R26.81);Other abnormalities of gait and mobility (R26.89);Muscle weakness (generalized) (M62.81)    Time: 6283-1517 PT Time Calculation (min) (ACUTE ONLY): 29 min   Charges:   PT Evaluation $PT Eval Moderate Complexity: 1 Mod PT Treatments $Therapeutic  Activity: 23-37 mins        12:28 PM, 03/04/22 Lonell Grandchild, MPT Physical Therapist with [facility] [phone] office [phone] mobile phone

## 2022-03-04 NOTE — Progress Notes (Signed)
PROGRESS NOTE    ARI ENGELBRECHT  PJA:[id number] DOB: 1926-12-18 DOA: 03/02/2022 PCP: Kathyrn Drown, MD   Brief Narrative:    CATRENA VARI is a 88 y.o. female with medical history significant of SSS status post pacemaker placement, hypertension, CKD stage IV, chronic atrial fibrillation, Hyperlipidemia, COPD who presents to the emergency department via EMS due to altered mental status.  She was also noted to have some unsteadiness with ambulation.  Chest x-ray demonstrated cardiogenic pulmonary edema with troponin elevation.  She was started on nitroglycerin IV drip as well as heparin drip on account of her troponin elevation.  She was seen by cardiology and heparin drip is not indicated at this time as she appears to have more of a demand ischemia and denies any other chest pain.  2D echocardiogram ordered and currently pending.  PT evaluation recommending home health, but patient is active with hospice.  She has been started on treatment for COVID infection as she is COVID positive.  She has also been started on diuresis with IV Lasix due to acute CHF exacerbation.  Assessment & Plan:   Principal Problem:   COVID-19 virus infection Active Problems:   Mixed hyperlipidemia   Essential hypertension   Sick sinus syndrome (HCC)   Atrial fibrillation with RVR (HCC)   CKD (chronic kidney disease), stage IV (HCC)   Elevated troponin   Elevated brain natriuretic peptide (BNP) level   Acute on chronic diastolic CHF (congestive heart failure) ([place])   Hypertensive emergency   Syncope   Obesity (BMI 30-39.9)  Assessment and Plan:  COVID-19 virus infection Continue albuterol q.6h Continue molnupiravir Continue vitamin-C 500 mg p.o. Daily Continue zinc 220 mg p.o. Daily Continue Mucinex, Robitussin  Continue Tylenol p.r.n. for fever Continue supplemental oxygen per home regimen  Continue incentive spirometry  Continue airborne isolation precaution Continue monitoring daily  inflammatory markers   Acute exacerbation of CHF Elevated BNP 965 (this was 772 on 11/24/2020) Chest x-ray was suggestive of mild cardiogenic pulmonary edema with small bilateral pleural effusions Continue total input/output, daily weights and fluid restriction Continue IV Lasix 40 mg x 2 was given, Hold IV lasix for now given Cr bump and follow Continue heart healthy diet     Echocardiogram EF>75% Cardiology consult appreciated, now signed off   Hypertensive emergency Essential hypertension Continue IV nitroglycerin drip Added oral hydralazine and IV hydralazine to help wean   Elevated troponin possibly secondary to type II demand ischemia, rule out ACS Troponin x2 -52 > 121 EKG personally reviewed showed A-fib with RVR Patient was not complaining of chest pain at this time Patient will be started on heparin drip Continue to trend troponin Cardiology will be consulted in the morning   Altered mental status Patient was oriented to person and place only (different from baseline-alert and oriented x4)   Syncope vs lethargy Patient was reported to be going in and out of consciousness PTA Continue telemetry and watch for arrhythmias Troponins x2 -52 > 121 continue to trend troponin EKG showed A-fib with RVR Echocardiogram will be done to rule out significant aortic stenosis or other outflow obstruction, and also to evaluate EF and to rule out segmental/Regional wall motion abnormalities.  Carotid artery Dopplers will be done to rule out hemodynamically significant stenosis   Chronic respiratory failure with hypoxia Continue home supplemental oxygen   CKD stage IV  BUN/creatinine 35/2.20 (creatinine was within baseline range) Renally adjust medications, avoid nephrotoxic agents/dehydration/hypotension   Chronic atrial fibrillation Continue diltiazem  SSS status post pacemaker placement Stable, pacemaker was last reviewed remotely on 12/15/2021 and it was normal,  Battery  status was good.  Lead measurements unchanged.  Histograms were appropriate   Mixed hyperlipidemia Continue Pravachol   Obesity (BMI 31.83) Diet and lifestyle modification     DVT prophylaxis: Heparin Code Status: Full Family Communication: None at bedside Disposition Plan:  Status is: Inpatient Remains inpatient appropriate because: Ongoing IV medications   Consultants:  Cardiology  Procedures:  None  Antimicrobials:  Anti-infectives (From admission, onward)    Start     Dose/Rate Route Frequency Ordered Stop   03/02/22 2300  molnupiravir EUA (LAGEVRIO) capsule 800 mg        4 capsule Oral 2 times daily 03/02/22 2250 03/07/22 2159       Subjective: Patient seen and evaluated today with no new acute complaints or concerns. No acute concerns or events noted overnight.  Objective: Vitals:   03/04/22 1110 03/04/22 1125 03/04/22 1132 03/04/22 1200  BP: (!) 149/79 129/73 (!) 136/48 124/89  Pulse: 78 77 77 80  Resp: 15     Temp:   (!) 97.5 F (36.4 C)   TempSrc:   Oral   SpO2: 97% 99% 98% 99%  Weight:      Height:        Intake/Output Summary (Last 24 hours) at 03/04/2022 1259 Last data filed at 03/04/2022 1229 Gross per 24 hour  Intake 954.42 ml  Output 1550 ml  Net -595.58 ml   Filed Weights   03/02/22 1849 03/04/22 0500  Weight: 78.9 kg 78.9 kg    Examination:  General exam: Appears calm and comfortable  Respiratory system: Clear to auscultation. Respiratory effort normal.  Nasal cannula oxygen Cardiovascular system: S1 & S2 heard, RRR.  Gastrointestinal system: Abdomen is soft Central nervous system: Alert and awake Extremities: No edema Skin: No significant lesions noted Psychiatry: Flat affect.    Data Reviewed: I have personally reviewed following labs and imaging studies  CBC: Recent Labs  Lab 03/02/22 2013 03/04/22 0511  WBC 8.9 5.9  NEUTROABS 7.4 3.6  HGB 11.0* 9.8*  HCT 35.3* 30.5*  MCV 92.2 92.1  PLT 197 154   Basic  Metabolic Panel: Recent Labs  Lab 03/02/22 2013 03/04/22 0511  NA 138 135  K 4.8 4.1  CL 108 105  CO2 22 23  GLUCOSE 101* 110*  BUN 35* 44*  CREATININE 2.20* 2.43*  CALCIUM 10.0 9.1  MG  --  1.9  PHOS  --  3.6   GFR: Estimated Creatinine Clearance: 14.1 mL/min (A) (by C-G formula based on SCr of 2.43 mg/dL (H)). Liver Function Tests: Recent Labs  Lab 03/02/22 2013 03/04/22 0511  AST 17 16  ALT 11 10  ALKPHOS 85 70  BILITOT 0.5 0.9  PROT 7.8 6.1*  ALBUMIN 4.5 3.3*   No results for input(s): "LIPASE", "AMYLASE" in the last 168 hours. Recent Labs  Lab 03/02/22 1945  AMMONIA 13   Coagulation Profile: No results for input(s): "INR", "PROTIME" in the last 168 hours. Cardiac Enzymes: No results for input(s): "CKTOTAL", "CKMB", "CKMBINDEX", "TROPONINI" in the last 168 hours. BNP (last 3 results) No results for input(s): "PROBNP" in the last 8760 hours. HbA1C: No results for input(s): "HGBA1C" in the last 72 hours. CBG: Recent Labs  Lab 03/02/22 1925  GLUCAP 91   Lipid Profile: No results for input(s): "CHOL", "HDL", "LDLCALC", "TRIG", "CHOLHDL", "LDLDIRECT" in the last 72 hours. Thyroid Function Tests: No results for input(s): "  TSH", "T4TOTAL", "FREET4", "T3FREE", "THYROIDAB" in the last 72 hours. Anemia Panel: Recent Labs    03/04/22 0511  FERRITIN 168   Sepsis Labs: Recent Labs  Lab 03/02/22 1943  LATICACIDVEN 0.9    Recent Results (from the past 240 hour(s))  SARS Coronavirus 2 by RT PCR (hospital order, performed in [facility] hospital lab) *cepheid single result test* Anterior Nasal Swab     Status: Abnormal   Collection Time: 03/02/22  7:20 PM   Specimen: Anterior Nasal Swab  Result Value Ref Range Status   SARS Coronavirus 2 by RT PCR POSITIVE (A) NEGATIVE Final    Comment: (NOTE) SARS-CoV-2 target nucleic acids are DETECTED  SARS-CoV-2 RNA is generally detectable in upper respiratory specimens  during the acute phase of infection.   Positive results are indicative  of the presence of the identified virus, but do not rule out bacterial infection or co-infection with other pathogens not detected by the test.  Clinical correlation with patient history and  other diagnostic information is necessary to determine patient infection status.  The expected result is negative.  Fact Sheet for Patients:   https://www.patel.info/   Fact Sheet for Healthcare Providers:   https://hall.com/    This test is not yet approved or cleared by the [place] FDA and  has been authorized for detection and/or diagnosis of SARS-CoV-2 by FDA under an Emergency Use Authorization (EUA).  This EUA will remain in effect (meaning this test can be used) for the duration of  the COVID-19 declaration under Section 564(b)(1)  of the Act, 21 U.S.C. section 360-bbb-3(b)(1), unless the authorization is terminated or revoked sooner.   Performed at [facility], [address], [place], [place] [address]          Radiology Studies: ECHOCARDIOGRAM COMPLETE  Result Date: 03/03/2022    ECHOCARDIOGRAM REPORT   Patient Name:   FAATIMAH SPIELBERG Date of Exam: 03/03/2022 Medical Rec #:  [id number]         Height:       62.0 in Accession #:    [id number]        Weight:       174.0 lb Date of Birth:  05-Sep-1926         BSA:          1.802 m Patient Age:    9 years          BP:           176/80 mmHg Patient Gender: F                 HR:           94 bpm. Exam Location:  [facility] Procedure: 2D Echo, Cardiac Doppler and Color Doppler Indications:    CHF  History:        Patient has prior history of Echocardiogram examinations, most                 recent 11/22/2020. CHF, Pacemaker, Stroke, Arrythmias:Atrial                 Fibrillation and Bradycardia, Signs/Symptoms:Syncope; Risk                 Factors:Hypertension, Diabetes and Dyslipidemia. COVID +.  Sonographer:    Wenda Low Referring Phys: [id number] OLADAPO  ADEFESO IMPRESSIONS  1. Left ventricular ejection fraction, by estimation, is >75%. The left ventricle has hyperdynamic function. The left ventricle has no regional wall motion abnormalities. There is  moderate left ventricular hypertrophy. Left ventricular diastolic parameters are indeterminate.  2. Right ventricular systolic function is normal. The right ventricular size is normal. There is severely elevated pulmonary artery systolic pressure. The estimated right ventricular systolic pressure is 53.6 mmHg.  3. Left atrial size was severely dilated.  4. Right atrial size was mildly dilated.  5. The mitral valve is normal in structure. Mild mitral valve regurgitation. Severe mitral stenosis. The mean mitral valve gradient is 13.0 mmHg. Moderate mitral annular calcification.  6. Tricuspid valve regurgitation is moderate.  7. The aortic valve is tricuspid. There is moderate calcification of the aortic valve. There is moderate thickening of the aortic valve. Aortic valve regurgitation is not visualized. Aortic valve sclerosis is present, with no evidence of aortic valve stenosis. Aortic valve area, by VTI measures 1.23 cm. Aortic valve mean gradient measures 5.7 mmHg. Aortic valve Vmax measures 1.77 m/s.  8. The inferior vena cava is normal in size with greater than 50% respiratory variability, suggesting right atrial pressure of 3 mmHg. Comparison(s): No significant change from prior study. Prior images reviewed side by side. FINDINGS  Left Ventricle: Left ventricular ejection fraction, by estimation, is >75%. The left ventricle has hyperdynamic function. The left ventricle has no regional wall motion abnormalities. The left ventricular internal cavity size was normal in size. There is moderate left ventricular hypertrophy. Left ventricular diastolic parameters are indeterminate. Right Ventricle: The right ventricular size is normal. No increase in right ventricular wall thickness. Right ventricular systolic function  is normal. There is severely elevated pulmonary artery systolic pressure. The tricuspid regurgitant velocity is 3.69 m/s, and with an assumed right atrial pressure of 8 mmHg, the estimated right ventricular systolic pressure is 14.4 mmHg. Left Atrium: Left atrial size was severely dilated. Right Atrium: Right atrial size was mildly dilated. Pericardium: There is no evidence of pericardial effusion. Mitral Valve: The mitral valve is normal in structure. There is mild thickening of the mitral valve leaflet(s). There is mild calcification of the mitral valve leaflet(s). Moderate mitral annular calcification. Mild mitral valve regurgitation. Severe mitral valve stenosis. MV peak gradient, 28.6 mmHg. The mean mitral valve gradient is 13.0 mmHg. Tricuspid Valve: The tricuspid valve is normal in structure. Tricuspid valve regurgitation is moderate . No evidence of tricuspid stenosis. Aortic Valve: The aortic valve is tricuspid. There is moderate calcification of the aortic valve. There is moderate thickening of the aortic valve. Aortic valve regurgitation is not visualized. Aortic valve sclerosis is present, with no evidence of aortic valve stenosis. Aortic valve mean gradient measures 5.7 mmHg. Aortic valve peak gradient measures 12.5 mmHg. Aortic valve area, by VTI measures 1.23 cm. Pulmonic Valve: The pulmonic valve was normal in structure. Pulmonic valve regurgitation is not visualized. No evidence of pulmonic stenosis. Aorta: The aortic root is normal in size and structure. Venous: The inferior vena cava is normal in size with greater than 50% respiratory variability, suggesting right atrial pressure of 3 mmHg. IAS/Shunts: No atrial level shunt detected by color flow Doppler. Additional Comments: A device lead is visualized in the right ventricle.  LEFT VENTRICLE PLAX 2D LVIDd:         3.00 cm LVIDs:         1.63 cm LV PW:         1.90 cm LV IVS:        1.40 cm LVOT diam:     1.70 cm LV SV:         45 LV SV Index:  25 LVOT Area:     2.27 cm  RIGHT VENTRICLE RV Basal diam:  3.50 cm RV Mid diam:    3.00 cm RV S prime:     11.60 cm/s TAPSE (M-mode): 2.2 cm LEFT ATRIUM              Index        RIGHT ATRIUM           Index LA diam:        5.20 cm  2.89 cm/m   RA Area:     23.50 cm LA Vol (A2C):   112.0 ml 62.16 ml/m  RA Volume:   65.30 ml  36.24 ml/m LA Vol (A4C):   102.0 ml 56.61 ml/m LA Biplane Vol: 109.0 ml 60.49 ml/m  AORTIC VALVE                     PULMONIC VALVE AV Area (Vmax):    1.33 cm      PV Vmax:       0.77 m/s AV Area (Vmean):   1.31 cm      PV Peak grad:  2.4 mmHg AV Area (VTI):     1.23 cm AV Vmax:           177.00 cm/s AV Vmean:          109.000 cm/s AV VTI:            0.364 m AV Peak Grad:      12.5 mmHg AV Mean Grad:      5.7 mmHg LVOT Vmax:         104.00 cm/s LVOT Vmean:        62.900 cm/s LVOT VTI:          0.196 m LVOT/AV VTI ratio: 0.54  AORTA Ao Root diam: 2.90 cm Ao Asc diam:  2.80 cm MITRAL VALVE                TRICUSPID VALVE MV Area (PHT): 1.74 cm     TR Peak grad:   54.5 mmHg MV Area VTI:   0.63 cm     TR Vmax:        369.00 cm/s MV Peak grad:  28.6 mmHg MV Mean grad:  13.0 mmHg    SHUNTS MV Vmax:       2.68 m/s     Systemic VTI:  0.20 m MV Vmean:      167.5 cm/s   Systemic Diam: 1.70 cm MV Decel Time: 437 msec MV E velocity: 230.00 cm/s Candee Furbish MD Electronically signed by Candee Furbish MD Signature Date/Time: 03/03/2022/12:02:01 PM    Final    US Carotid Bilateral  Result Date: 03/03/2022 CLINICAL DATA:  Syncope, confusion and weakness for the past day. History of hypertension, hyperlipidemia and diabetes. EXAM: BILATERAL CAROTID DUPLEX ULTRASOUND TECHNIQUE: Pearline Cables scale imaging, color Doppler and duplex ultrasound were performed of bilateral carotid and vertebral arteries in the neck. COMPARISON:  Carotid Doppler ultrasound-05/02/2020 FINDINGS: The examination is degraded secondary to patient's inability to cooperate and tolerate the examination. Criteria: Quantification of carotid  stenosis is based on velocity parameters that correlate the residual internal carotid diameter with NASCET-based stenosis levels, using the diameter of the distal internal carotid lumen as the denominator for stenosis measurement. The following velocity measurements were obtained: RIGHT ICA: 107/15 cm/sec CCA: 23/5 cm/sec SYSTOLIC ICA/CCA RATIO:  2.1 ECA: 314 cm/sec LEFT ICA: 129/20 cm/sec CCA: 57/32 cm/sec SYSTOLIC ICA/CCA RATIO:  1.5  ECA: 40 cm/sec RIGHT CAROTID ARTERY: There is a moderate amount of eccentric echogenic partially shadowing plaque scattered throughout the right common carotid artery (images 7 and 11). There is a large amount of eccentric echogenic plaque within the right carotid bulb, extending to involve the origin and proximal aspects of the right internal carotid artery, morphologically similar to the 04/2020 examination and again not resulting in elevated peak systolic velocities within the interrogated course of the right internal carotid artery to suggest a hemodynamically significant stenosis. RIGHT VERTEBRAL ARTERY:  Antegrade flow LEFT CAROTID ARTERY: There is a moderate amount of eccentric echogenic plaque scattered throughout the left common carotid artery (images 41, 45 and 49). There is a moderate amount of eccentric mixed echogenic partially shadowing plaque within left carotid bulb (image 54). There is a moderate-to-large amount of eccentric echogenic partially shadowing plaque involving the origin and proximal aspects of the left internal carotid artery (images 63 and 65), potentially progressed compared to the 04/2020 examination resulting in elevated peak systolic velocities within the proximal aspect of the left internal carotid artery. Greatest acquired peak systolic velocity within the proximal left ICA measures 129 centimeters/second (image 64), previously, greatest acquired peak systolic velocity in the proximal left ICA measures 122 centimeters/second. LEFT VERTEBRAL ARTERY:   Antegrade flow IMPRESSION: 1. Moderate to large amount of left-sided atherosclerotic plaque, potentially progressed compared to the 04/2020 examination, and results in elevated peak systolic velocities within the left internal carotid artery compatible with the 50-69% luminal narrowing range. Further evaluation with CTA could be performed as clinically indicated. 2. Moderate amount of right-sided atherosclerotic plaque, similar to the 04/2020 examination, and again not resulting in a hemodynamically significant stenosis. Electronically Signed   By: Sandi Mariscal M.D.   On: 03/03/2022 10:03   DG Chest Port 1 View  Result Date: 03/02/2022 CLINICAL DATA:  Altered mental status, syncope EXAM: PORTABLE CHEST 1 VIEW COMPARISON:  11/21/2020 FINDINGS: The lungs are symmetrically well expanded. Small bilateral pleural effusions are present, left greater than right. Mild perihilar interstitial pulmonary infiltrate is present, most in keeping with mild cardiogenic pulmonary edema. No pneumothorax. Cardiac size is mildly enlarged, unchanged. Left subclavian dual lead pacemaker is unchanged. Advanced atherosclerotic calcification is seen within the thoracic aorta. No acute bone abnormality. Osseous structures are age-appropriate. IMPRESSION: Mild cardiogenic pulmonary edema with small bilateral pleural effusions. Stable cardiomegaly Electronically Signed   By: Fidela Salisbury M.D.   On: 03/02/2022 20:17   CT Head Wo Contrast  Result Date: 03/02/2022 CLINICAL DATA:  Altered mental status, lethargy EXAM: CT HEAD WITHOUT CONTRAST TECHNIQUE: Contiguous axial images were obtained from the base of the skull through the vertex without intravenous contrast. RADIATION DOSE REDUCTION: This exam was performed according to the departmental dose-optimization program which includes automated exposure control, adjustment of the mA and/or kV according to patient size and/or use of iterative reconstruction technique. COMPARISON:  05/02/2020  FINDINGS: Brain: Normal anatomic configuration. Parenchymal volume loss is commensurate with the patient's age. Moderate periventricular white matter changes are present likely reflecting the sequela of small vessel ischemia. 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 No abnormal intra or extra-axial mass lesion or fluid collection. No abnormal mass effect or midline shift. No evidence of acute intracranial hemorrhage or infarct. Ventricular size is normal. Cerebellum unremarkable. Vascular: No asymmetric hyperdense vasculature at the skull base. Skull: Intact Sinuses/Orbits: Partially calcified soft tissue is seen within the visualized right maxillary sinus, incompletely evaluated on this examination. Thickening of  the right maxillary walls again noted. Together, the findings may reflect changes of chronic sinusitis. Remaining paranasal sinuses are clear. Orbits are unremarkable. Other: Mastoid air cells and middle ear cavities are clear. IMPRESSION: 1. No acute intracranial abnormality. 2. Stable senescent changes and remote infarcts. 3. Probable chronic right maxillary sinusitis. Electronically Signed   By: Fidela Salisbury M.D.   On: 03/02/2022 20:15        Scheduled Meds:  vitamin C  500 mg Oral Daily   Chlorhexidine Gluconate Cloth  6 each Topical Daily   dextromethorphan-guaiFENesin  1 tablet Oral BID   diltiazem  360 mg Oral Daily   heparin injection (subcutaneous)  5,000 Units Subcutaneous Q8H   hydrALAZINE  50 mg Oral Q8H   [START ON 03/05/2022] influenza vaccine adjuvanted  0.5 mL Intramuscular Tomorrow-1000   latanoprost  1 drop Both Eyes QHS   molnupiravir EUA  4 capsule Oral BID   pantoprazole (PROTONIX) IV  40 mg Intravenous QHS   pravastatin  80 mg Oral q1800   prednisoLONE acetate  1 drop Right Eye QID   senna-docusate  2 tablet Oral BID   zinc sulfate  220 mg Oral Daily   Continuous Infusions:  nitroGLYCERIN 10 mcg/min (03/04/22  1229)     LOS: 1 day    Time spent: 35 minutes    Josepha Barbier Darleen Crocker, DO [facility]  If 7PM-7AM, please contact night-coverage [email or web address] 03/04/2022, 12:59 PM

## 2022-03-04 NOTE — Plan of Care (Signed)
  Problem: Acute Rehab PT Goals(only PT should resolve) Goal: Pt Will Go Supine/Side To Sit Outcome: Progressing Flowsheets (Taken 03/04/2022 1229) Pt will go Supine/Side to Sit: with modified independence Goal: Patient Will Transfer Sit To/From Stand Outcome: Progressing Flowsheets (Taken 03/04/2022 1229) Patient will transfer sit to/from stand:  with supervision  with min guard assist Goal: Pt Will Transfer Bed To Chair/Chair To Bed Outcome: Progressing Flowsheets (Taken 03/04/2022 1229) Pt will Transfer Bed to Chair/Chair to Bed:  with supervision  min guard assist Goal: Pt Will Ambulate Outcome: Progressing Flowsheets (Taken 03/04/2022 1229) Pt will Ambulate:  50 feet  with min guard assist  with minimal assist  with rolling walker   12:30 PM, 03/04/22 Lonell Grandchild, MPT Physical Therapist with [facility] [phone] office [phone] mobile phone

## 2022-03-04 NOTE — Progress Notes (Signed)
    Patient Profile     [name] is a 88 y.o. female with a hx of pacemaker who is being seen 03/03/2022 for the evaluation of elevated troponin at the request of Dr. Manuella Ghazi.  88 year old female COVID-positive pneumonia with lethargy hypertensive urgency respiratory failure chronic kidney disease stage IV chronic atrial fibrillation pacemaker hyperlipidemia BNP 965.  Troponin was elevated from 50 up to 120, 221 mild elevation. Assessment & Plan    Elevated troponin -peak trop 251 in setting of COVID pneumonia, HTN urgency, CKD - EKG no acute ischemic changes - Echo LVE >75%, no WMAs, normal RV function. Severe pulm HTN PASP 63, severe LAE, severe mitral stenosis mean grad 13, mod TR,    2. Severe mitral stenosis - mean grad 13, on my review heavily calcified valve.  - at 4 with heavily calcified valve not a candidate for intervention  3. CKD IV   4. COVID infection  5. Permanent afib - followed by EP, has not been on anticoag. Ongoing considerations, defer to her outpatient EP provider. WIth severe  mitral stenosis coumadin would be indicated over DOAC - tele shows rate controlled afib   6. Pacemaker  7. Pulmonary HTN - severe by echo, likely secondary to diastolic dysfunction given severe LAE and severe mitral stenosis - manage fluid status with diuretics, with advanced age do not see indication for broader workup at this time     No additional cardiology recs at this time, we will sign off inpatient care   For questions or updates, please contact [facility] Please consult [email or web address] for contact info under        Signed, Carlyle Dolly, MD  03/04/2022, 10:16 AM

## 2022-03-04 NOTE — Progress Notes (Signed)
2219 patient report called to transfer to [phone] called daughter [name] to update on room change

## 2022-03-05 DIAGNOSIS — U071 COVID-19: Secondary | ICD-10-CM | POA: Diagnosis not present

## 2022-03-05 LAB — CBC WITH DIFFERENTIAL/PLATELET
Abs Immature Granulocytes: 0.02 10*3/uL (ref 0.00–0.07)
Basophils Absolute: 0 10*3/uL (ref 0.0–0.1)
Basophils Relative: 0 %
Eosinophils Absolute: 0.1 10*3/uL (ref 0.0–0.5)
Eosinophils Relative: 2 %
HCT: 30.1 % — ABNORMAL LOW (ref 36.0–46.0)
Hemoglobin: 9.9 g/dL — ABNORMAL LOW (ref 12.0–15.0)
Immature Granulocytes: 0 %
Lymphocytes Relative: 20 %
Lymphs Abs: 1 10*3/uL (ref 0.7–4.0)
MCH: 29.6 pg (ref 26.0–34.0)
MCHC: 32.9 g/dL (ref 30.0–36.0)
MCV: 90.1 fL (ref 80.0–100.0)
Monocytes Absolute: 0.9 10*3/uL (ref 0.1–1.0)
Monocytes Relative: 17 %
Neutro Abs: 3.2 10*3/uL (ref 1.7–7.7)
Neutrophils Relative %: 61 %
Platelets: 170 10*3/uL (ref 150–400)
RBC: 3.34 MIL/uL — ABNORMAL LOW (ref 3.87–5.11)
RDW: 13.4 % (ref 11.5–15.5)
WBC: 5.2 10*3/uL (ref 4.0–10.5)
nRBC: 0 % (ref 0.0–0.2)

## 2022-03-05 LAB — COMPREHENSIVE METABOLIC PANEL
ALT: 10 U/L (ref 0–44)
AST: 13 U/L — ABNORMAL LOW (ref 15–41)
Albumin: 3.2 g/dL — ABNORMAL LOW (ref 3.5–5.0)
Alkaline Phosphatase: 66 U/L (ref 38–126)
Anion gap: 6 (ref 5–15)
BUN: 47 mg/dL — ABNORMAL HIGH (ref 8–23)
CO2: 23 mmol/L (ref 22–32)
Calcium: 9.2 mg/dL (ref 8.9–10.3)
Chloride: 105 mmol/L (ref 98–111)
Creatinine, Ser: 2.55 mg/dL — ABNORMAL HIGH (ref 0.44–1.00)
GFR, Estimated: 17 mL/min — ABNORMAL LOW (ref >60)
Glucose, Bld: 87 mg/dL (ref 70–99)
Potassium: 3.7 mmol/L (ref 3.5–5.1)
Sodium: 134 mmol/L — ABNORMAL LOW (ref 135–145)
Total Bilirubin: 0.9 mg/dL (ref 0.3–1.2)
Total Protein: 5.9 g/dL — ABNORMAL LOW (ref 6.5–8.1)

## 2022-03-05 LAB — PHOSPHORUS: Phosphorus: 3.1 mg/dL (ref 2.5–4.6)

## 2022-03-05 LAB — C-REACTIVE PROTEIN: CRP: 1.7 mg/dL — ABNORMAL HIGH (ref <1.0)

## 2022-03-05 LAB — MAGNESIUM: Magnesium: 1.9 mg/dL (ref 1.7–2.4)

## 2022-03-05 LAB — D-DIMER, QUANTITATIVE: D-Dimer, Quant: 1.55 ug/mL-FEU — ABNORMAL HIGH (ref 0.00–0.50)

## 2022-03-05 LAB — MRSA NEXT GEN BY PCR, NASAL: MRSA by PCR Next Gen: NOT DETECTED

## 2022-03-05 LAB — FERRITIN: Ferritin: 246 ng/mL (ref 11–307)

## 2022-03-05 MED ORDER — ZINC SULFATE 220 (50 ZN) MG PO CAPS: 220.0000 mg | ORAL_CAPSULE | Freq: Every day | ORAL | 0 refills | Status: AC

## 2022-03-05 MED ORDER — MOLNUPIRAVIR EUA 200MG CAPSULE: 4.0000 | ORAL_CAPSULE | Freq: Two times a day (BID) | ORAL | 0 refills | Status: AC

## 2022-03-05 MED ORDER — ASCORBIC ACID 500 MG PO TABS: 500.0000 mg | ORAL_TABLET | Freq: Every day | ORAL | 0 refills | Status: AC

## 2022-03-05 MED ORDER — HYDRALAZINE HCL 50 MG PO TABS: 50.0000 mg | ORAL_TABLET | Freq: Three times a day (TID) | ORAL | 0 refills | Status: DC

## 2022-03-05 MED ORDER — GUAIFENESIN-DM 100-10 MG/5ML PO SYRP: 10.0000 mL | ORAL_SOLUTION | ORAL | 0 refills | Status: DC | PRN

## 2022-03-05 NOTE — Discharge Summary (Signed)
Physician Discharge Summary  [name] HCW:[id number] DOB: [birth year] DOA: 03/02/2022  PCP: Kathyrn Drown, MD  Admit date: 03/02/2022  Discharge date: 03/05/2022  Admitted From:Home with hospice  Disposition:  Home with hospice  Recommendations for Outpatient Follow-up:  Follow up with hospice agency Remain on antihypertensives and other prior medications as before Continue molnupiravir for 2 more days to finish course of treatment  Home Health:None  Equipment/Devices:  Discharge Condition:Stable  CODE STATUS: Full  Diet recommendation: Heart Healthy  Brief/Interim Summary: [name] is a 88 y.o. female with medical history significant of SSS status post pacemaker placement, hypertension, CKD stage IV, chronic atrial fibrillation, Hyperlipidemia, COPD who presents to the emergency department via EMS due to altered mental status.  She was also noted to have some unsteadiness with ambulation.  Chest x-ray demonstrated cardiogenic pulmonary edema with troponin elevation.  She was started on nitroglycerin IV drip as well as heparin drip on account of her troponin elevation.  She was seen by cardiology and heparin drip is not indicated at this time as she appears to have more of a demand ischemia and denies any other chest pain.  2D echocardiogram as noted below with hyperdynamic LV function and severe pulmonary hypertension.  PT evaluation recommending home health, but patient is active with hospice.  She has been started on treatment for COVID infection as she is COVID positive.  She has also been started on diuresis with IV Lasix due to acute CHF exacerbation.  She has now been weaned off of nitroglycerin drip and is in stable condition for discharge back to her home hospice agency.  She has been started on hydralazine to help assist with her blood pressures as these were quite elevated during the course of the admission.  Discharge Diagnoses:  Principal Problem:    COVID-19 virus infection Active Problems:   Mixed hyperlipidemia   Essential hypertension   Sick sinus syndrome (HCC)   Atrial fibrillation with RVR (HCC)   CKD (chronic kidney disease), stage IV (HCC)   Elevated troponin   Elevated brain natriuretic peptide (BNP) level   Acute on chronic diastolic CHF (congestive heart failure) (HCC)   Hypertensive emergency   Syncope   Obesity (BMI 30-39.9)  Principal discharge diagnosis: Elevated troponin in the setting of COVID pneumonia with hypertensive urgency and stage IV CKD.  Discharge Instructions  Discharge Instructions     Diet - low sodium heart healthy   Complete by: As directed    Increase activity slowly   Complete by: As directed       Allergies as of 03/05/2022       Reactions   Tramadol    Drowsiness        Medication List     STOP taking these medications    diltiazem 360 MG 24 hr capsule Commonly known as: TIAZAC       TAKE these medications    albuterol 108 (90 Base) MCG/ACT inhaler Commonly known as: VENTOLIN HFA TAKE 2 PUFFS EVERY 6 HOURS AS NEEDED What changed: See the new instructions.   ascorbic acid 500 MG tablet Commonly known as: VITAMIN C Take 1 tablet (500 mg total) by mouth daily. Start taking on: March 06, 2022   ciprofloxacin 0.3 % ophthalmic solution Commonly known as: CILOXAN   diltiazem 360 MG 24 hr capsule Commonly known as: CARDIZEM CD Take 360 mg by mouth daily.   guaiFENesin-dextromethorphan 100-10 MG/5ML syrup Commonly known as: ROBITUSSIN DM Take 10 mLs by mouth  every 4 (four) hours as needed for cough.   hydrALAZINE 50 MG tablet Commonly known as: APRESOLINE Take 1 tablet (50 mg total) by mouth every 8 (eight) hours.   molnupiravir EUA 200 mg Caps capsule Commonly known as: LAGEVRIO Take 4 capsules (800 mg total) by mouth 2 (two) times daily for 2 days.   mupirocin ointment 2 % Commonly known as: BACTROBAN Apply thin amount daily to skin blister    OXYGEN Inhale 2 L into the lungs continuous.   pravastatin 80 MG tablet Commonly known as: PRAVACHOL TAKE (1) TABLET BY MOUTH AT BEDTIME. What changed: See the new instructions.   prednisoLONE acetate 1 % ophthalmic suspension Commonly known as: PRED FORTE Place 1 drop into the right eye 4 (four) times daily.   Senna-Plus 8.6-50 MG tablet Generic drug: senna-docusate Take by mouth 2 (two) times daily.   torsemide 20 MG tablet Commonly known as: DEMADEX TAKE 3 TABLETS BY MOUTH DAILY What changed:  how much to take how to take this when to take this additional instructions   Travoprost (BAK Free) 0.004 % Soln ophthalmic solution Commonly known as: TRAVATAN Place 1 drop into both eyes at bedtime.   zinc sulfate 220 (50 Zn) MG capsule Take 1 capsule (220 mg total) by mouth daily. Start taking on: March 06, 2022        Follow-up Information     Luking, Elayne Snare, MD. Schedule an appointment as soon as possible for a visit in 1 week(s).   Specialty: Family Medicine Contact information: [place] [address] [phone]                Allergies  Allergen Reactions   Tramadol     Drowsiness    Consultations: Cardiology   Procedures/Studies: ECHOCARDIOGRAM COMPLETE  Result Date: 03/03/2022    ECHOCARDIOGRAM REPORT   Patient Name:   [name] Date of Exam: 03/03/2022 Medical Rec #:  [id number]         Height:       62.0 in Accession #:    [id number]        Weight:       174.0 lb Date of Birth:  August 31, 1926         BSA:          1.802 m Patient Age:    28 years          BP:           176/80 mmHg Patient Gender: F                 HR:           94 bpm. Exam Location:  [facility] Procedure: 2D Echo, Cardiac Doppler and Color Doppler Indications:    CHF  History:        Patient has prior history of Echocardiogram examinations, most                 recent 11/22/2020. CHF, Pacemaker, Stroke, Arrythmias:Atrial                  Fibrillation and Bradycardia, Signs/Symptoms:Syncope; Risk                 Factors:Hypertension, Diabetes and Dyslipidemia. COVID +.  Sonographer:    Wenda Low Referring Phys: [id number] OLADAPO ADEFESO IMPRESSIONS  1. Left ventricular ejection fraction, by estimation, is >75%. The left ventricle has hyperdynamic function. The left ventricle has no regional wall motion  abnormalities. There is moderate left ventricular hypertrophy. Left ventricular diastolic parameters are indeterminate.  2. Right ventricular systolic function is normal. The right ventricular size is normal. There is severely elevated pulmonary artery systolic pressure. The estimated right ventricular systolic pressure is 53.9 mmHg.  3. Left atrial size was severely dilated.  4. Right atrial size was mildly dilated.  5. The mitral valve is normal in structure. Mild mitral valve regurgitation. Severe mitral stenosis. The mean mitral valve gradient is 13.0 mmHg. Moderate mitral annular calcification.  6. Tricuspid valve regurgitation is moderate.  7. The aortic valve is tricuspid. There is moderate calcification of the aortic valve. There is moderate thickening of the aortic valve. Aortic valve regurgitation is not visualized. Aortic valve sclerosis is present, with no evidence of aortic valve stenosis. Aortic valve area, by VTI measures 1.23 cm. Aortic valve mean gradient measures 5.7 mmHg. Aortic valve Vmax measures 1.77 m/s.  8. The inferior vena cava is normal in size with greater than 50% respiratory variability, suggesting right atrial pressure of 3 mmHg. Comparison(s): No significant change from prior study. Prior images reviewed side by side. FINDINGS  Left Ventricle: Left ventricular ejection fraction, by estimation, is >75%. The left ventricle has hyperdynamic function. The left ventricle has no regional wall motion abnormalities. The left ventricular internal cavity size was normal in size. There is moderate left ventricular  hypertrophy. Left ventricular diastolic parameters are indeterminate. Right Ventricle: The right ventricular size is normal. No increase in right ventricular wall thickness. Right ventricular systolic function is normal. There is severely elevated pulmonary artery systolic pressure. The tricuspid regurgitant velocity is 3.69 m/s, and with an assumed right atrial pressure of 8 mmHg, the estimated right ventricular systolic pressure is 76.7 mmHg. Left Atrium: Left atrial size was severely dilated. Right Atrium: Right atrial size was mildly dilated. Pericardium: There is no evidence of pericardial effusion. Mitral Valve: The mitral valve is normal in structure. There is mild thickening of the mitral valve leaflet(s). There is mild calcification of the mitral valve leaflet(s). Moderate mitral annular calcification. Mild mitral valve regurgitation. Severe mitral valve stenosis. MV peak gradient, 28.6 mmHg. The mean mitral valve gradient is 13.0 mmHg. Tricuspid Valve: The tricuspid valve is normal in structure. Tricuspid valve regurgitation is moderate . No evidence of tricuspid stenosis. Aortic Valve: The aortic valve is tricuspid. There is moderate calcification of the aortic valve. There is moderate thickening of the aortic valve. Aortic valve regurgitation is not visualized. Aortic valve sclerosis is present, with no evidence of aortic valve stenosis. Aortic valve mean gradient measures 5.7 mmHg. Aortic valve peak gradient measures 12.5 mmHg. Aortic valve area, by VTI measures 1.23 cm. Pulmonic Valve: The pulmonic valve was normal in structure. Pulmonic valve regurgitation is not visualized. No evidence of pulmonic stenosis. Aorta: The aortic root is normal in size and structure. Venous: The inferior vena cava is normal in size with greater than 50% respiratory variability, suggesting right atrial pressure of 3 mmHg. IAS/Shunts: No atrial level shunt detected by color flow Doppler. Additional Comments: A device lead  is visualized in the right ventricle.  LEFT VENTRICLE PLAX 2D LVIDd:         3.00 cm LVIDs:         1.63 cm LV PW:         1.90 cm LV IVS:        1.40 cm LVOT diam:     1.70 cm LV SV:         45  LV SV Index:   25 LVOT Area:     2.27 cm  RIGHT VENTRICLE RV Basal diam:  3.50 cm RV Mid diam:    3.00 cm RV S prime:     11.60 cm/s TAPSE (M-mode): 2.2 cm LEFT ATRIUM              Index        RIGHT ATRIUM           Index LA diam:        5.20 cm  2.89 cm/m   RA Area:     23.50 cm LA Vol (A2C):   112.0 ml 62.16 ml/m  RA Volume:   65.30 ml  36.24 ml/m LA Vol (A4C):   102.0 ml 56.61 ml/m LA Biplane Vol: 109.0 ml 60.49 ml/m  AORTIC VALVE                     PULMONIC VALVE AV Area (Vmax):    1.33 cm      PV Vmax:       0.77 m/s AV Area (Vmean):   1.31 cm      PV Peak grad:  2.4 mmHg AV Area (VTI):     1.23 cm AV Vmax:           177.00 cm/s AV Vmean:          109.000 cm/s AV VTI:            0.364 m AV Peak Grad:      12.5 mmHg AV Mean Grad:      5.7 mmHg LVOT Vmax:         104.00 cm/s LVOT Vmean:        62.900 cm/s LVOT VTI:          0.196 m LVOT/AV VTI ratio: 0.54  AORTA Ao Root diam: 2.90 cm Ao Asc diam:  2.80 cm MITRAL VALVE                TRICUSPID VALVE MV Area (PHT): 1.74 cm     TR Peak grad:   54.5 mmHg MV Area VTI:   0.63 cm     TR Vmax:        369.00 cm/s MV Peak grad:  28.6 mmHg MV Mean grad:  13.0 mmHg    SHUNTS MV Vmax:       2.68 m/s     Systemic VTI:  0.20 m MV Vmean:      167.5 cm/s   Systemic Diam: 1.70 cm MV Decel Time: 437 msec MV E velocity: 230.00 cm/s Candee Furbish MD Electronically signed by Candee Furbish MD Signature Date/Time: 03/03/2022/12:02:01 PM    Final    US Carotid Bilateral  Result Date: 03/03/2022 CLINICAL DATA:  Syncope, confusion and weakness for the past day. History of hypertension, hyperlipidemia and diabetes. EXAM: BILATERAL CAROTID DUPLEX ULTRASOUND TECHNIQUE: Pearline Cables scale imaging, color Doppler and duplex ultrasound were performed of bilateral carotid and vertebral arteries in the  neck. COMPARISON:  Carotid Doppler ultrasound-05/02/2020 FINDINGS: The examination is degraded secondary to patient's inability to cooperate and tolerate the examination. Criteria: Quantification of carotid stenosis is based on velocity parameters that correlate the residual internal carotid diameter with NASCET-based stenosis levels, using the diameter of the distal internal carotid lumen as the denominator for stenosis measurement. The following velocity measurements were obtained: RIGHT ICA: 107/15 cm/sec CCA: 35/3 cm/sec SYSTOLIC ICA/CCA RATIO:  2.1 ECA: 314 cm/sec LEFT ICA: 129/20 cm/sec CCA: 84/13 cm/sec  SYSTOLIC ICA/CCA RATIO:  1.5 ECA: 40 cm/sec RIGHT CAROTID ARTERY: There is a moderate amount of eccentric echogenic partially shadowing plaque scattered throughout the right common carotid artery (images 7 and 11). There is a large amount of eccentric echogenic plaque within the right carotid bulb, extending to involve the origin and proximal aspects of the right internal carotid artery, morphologically similar to the 04/2020 examination and again not resulting in elevated peak systolic velocities within the interrogated course of the right internal carotid artery to suggest a hemodynamically significant stenosis. RIGHT VERTEBRAL ARTERY:  Antegrade flow LEFT CAROTID ARTERY: There is a moderate amount of eccentric echogenic plaque scattered throughout the left common carotid artery (images 41, 45 and 49). There is a moderate amount of eccentric mixed echogenic partially shadowing plaque within left carotid bulb (image 54). There is a moderate-to-large amount of eccentric echogenic partially shadowing plaque involving the origin and proximal aspects of the left internal carotid artery (images 63 and 65), potentially progressed compared to the 04/2020 examination resulting in elevated peak systolic velocities within the proximal aspect of the left internal carotid artery. Greatest acquired peak systolic velocity  within the proximal left ICA measures 129 centimeters/second (image 64), previously, greatest acquired peak systolic velocity in the proximal left ICA measures 122 centimeters/second. LEFT VERTEBRAL ARTERY:  Antegrade flow IMPRESSION: 1. Moderate to large amount of left-sided atherosclerotic plaque, potentially progressed compared to the 04/2020 examination, and results in elevated peak systolic velocities within the left internal carotid artery compatible with the 50-69% luminal narrowing range. Further evaluation with CTA could be performed as clinically indicated. 2. Moderate amount of right-sided atherosclerotic plaque, similar to the 04/2020 examination, and again not resulting in a hemodynamically significant stenosis. Electronically Signed   By: Sandi Mariscal M.D.   On: 03/03/2022 10:03   DG Chest Port 1 View  Result Date: 03/02/2022 CLINICAL DATA:  Altered mental status, syncope EXAM: PORTABLE CHEST 1 VIEW COMPARISON:  11/21/2020 FINDINGS: The lungs are symmetrically well expanded. Small bilateral pleural effusions are present, left greater than right. Mild perihilar interstitial pulmonary infiltrate is present, most in keeping with mild cardiogenic pulmonary edema. No pneumothorax. Cardiac size is mildly enlarged, unchanged. Left subclavian dual lead pacemaker is unchanged. Advanced atherosclerotic calcification is seen within the thoracic aorta. No acute bone abnormality. Osseous structures are age-appropriate. IMPRESSION: Mild cardiogenic pulmonary edema with small bilateral pleural effusions. Stable cardiomegaly Electronically Signed   By: Fidela Salisbury M.D.   On: 03/02/2022 20:17   CT Head Wo Contrast  Result Date: 03/02/2022 CLINICAL DATA:  Altered mental status, lethargy EXAM: CT HEAD WITHOUT CONTRAST TECHNIQUE: Contiguous axial images were obtained from the base of the skull through the vertex without intravenous contrast. RADIATION DOSE REDUCTION: This exam was performed according to the  departmental dose-optimization program which includes automated exposure control, adjustment of the mA and/or kV according to patient size and/or use of iterative reconstruction technique. COMPARISON:  05/02/2020 FINDINGS: Brain: Normal anatomic configuration. Parenchymal volume loss is commensurate with the patient's age. Moderate periventricular white matter changes are present likely reflecting the sequela of small vessel ischemia. 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 No abnormal intra or extra-axial mass lesion or fluid collection. No abnormal mass effect or midline shift. No evidence of acute intracranial hemorrhage or infarct. Ventricular size is normal. Cerebellum unremarkable. Vascular: No asymmetric hyperdense vasculature at the skull base. Skull: Intact Sinuses/Orbits: Partially calcified soft tissue is seen within the visualized right maxillary sinus, incompletely evaluated  on this examination. Thickening of the right maxillary walls again noted. Together, the findings may reflect changes of chronic sinusitis. Remaining paranasal sinuses are clear. Orbits are unremarkable. Other: Mastoid air cells and middle ear cavities are clear. IMPRESSION: 1. No acute intracranial abnormality. 2. Stable senescent changes and remote infarcts. 3. Probable chronic right maxillary sinusitis. Electronically Signed   By: Fidela Salisbury M.D.   On: 03/02/2022 20:15     Discharge Exam: Vitals:   03/04/22 2327 03/05/22 0406  BP: (!) 144/59 (!) 157/45  Pulse: 63 61  Resp: 16   Temp: 98.5 F (36.9 C) 98.3 F (36.8 C)  SpO2: 99% 99%   Vitals:   03/04/22 2136 03/04/22 2327 03/05/22 0406 03/05/22 0431  BP: (!) 172/53 (!) 144/59 (!) 157/45   Pulse:  63 61   Resp:  16    Temp:  98.5 F (36.9 C) 98.3 F (36.8 C)   TempSrc:  Oral Oral   SpO2:  99% 99%   Weight:    58.9 kg  Height:    5\' 2"  (1.575 m)    General: Pt is alert, awake, not in acute  distress Cardiovascular: RRR, S1/S2 +, no rubs, no gallops Respiratory: CTA bilaterally, no wheezing, no rhonchi Abdominal: Soft, NT, ND, bowel sounds + Extremities: no edema, no cyanosis    The results of significant diagnostics from this hospitalization (including imaging, microbiology, ancillary and laboratory) are listed below for reference.     Microbiology: Recent Results (from the past 240 hour(s))  SARS Coronavirus 2 by RT PCR (hospital order, performed in [facility] hospital lab) *cepheid single result test* Anterior Nasal Swab     Status: Abnormal   Collection Time: 03/02/22  7:20 PM   Specimen: Anterior Nasal Swab  Result Value Ref Range Status   SARS Coronavirus 2 by RT PCR POSITIVE (A) NEGATIVE Final    Comment: (NOTE) SARS-CoV-2 target nucleic acids are DETECTED  SARS-CoV-2 RNA is generally detectable in upper respiratory specimens  during the acute phase of infection.  Positive results are indicative  of the presence of the identified virus, but do not rule out bacterial infection or co-infection with other pathogens not detected by the test.  Clinical correlation with patient history and  other diagnostic information is necessary to determine patient infection status.  The expected result is negative.  Fact Sheet for Patients:   https://www.patel.info/   Fact Sheet for Healthcare Providers:   https://hall.com/    This test is not yet approved or cleared by the [place] FDA and  has been authorized for detection and/or diagnosis of SARS-CoV-2 by FDA under an Emergency Use Authorization (EUA).  This EUA will remain in effect (meaning this test can be used) for the duration of  the COVID-19 declaration under Section 564(b)(1)  of the Act, 21 U.S.C. section 360-bbb-3(b)(1), unless the authorization is terminated or revoked sooner.   Performed at [facility], [address] [place], [place] [address]       Labs: BNP (last 3 results) Recent Labs    03/02/22 2050  BNP 563.8*   Basic Metabolic Panel: Recent Labs  Lab 03/02/22 2013 03/04/22 0511 03/05/22 0341  NA 138 135 134*  K 4.8 4.1 3.7  CL 108 105 105  CO2 22 23 23   GLUCOSE 101* 110* 87  BUN 35* 44* 47*  CREATININE 2.20* 2.43* 2.55*  CALCIUM 10.0 9.1 9.2  MG  --  1.9 1.9  PHOS  --  3.6 3.1  Liver Function Tests: Recent Labs  Lab 03/02/22 2013 03/04/22 0511 03/05/22 0341  AST 17 16 13*  ALT 11 10 10   ALKPHOS 85 70 66  BILITOT 0.5 0.9 0.9  PROT 7.8 6.1* 5.9*  ALBUMIN 4.5 3.3* 3.2*   No results for input(s): "LIPASE", "AMYLASE" in the last 168 hours. Recent Labs  Lab 03/02/22 1945  AMMONIA 13   CBC: Recent Labs  Lab 03/02/22 2013 03/04/22 0511 03/05/22 0341  WBC 8.9 5.9 5.2  NEUTROABS 7.4 3.6 3.2  HGB 11.0* 9.8* 9.9*  HCT 35.3* 30.5* 30.1*  MCV 92.2 92.1 90.1  PLT 197 167 170   Cardiac Enzymes: No results for input(s): "CKTOTAL", "CKMB", "CKMBINDEX", "TROPONINI" in the last 168 hours. BNP: Invalid input(s): "POCBNP" CBG: Recent Labs  Lab 03/02/22 1925  GLUCAP 91   D-Dimer Recent Labs    03/04/22 0511 03/05/22 0341  DDIMER 2.01* 1.55*   Hgb A1c No results for input(s): "HGBA1C" in the last 72 hours. Lipid Profile No results for input(s): "CHOL", "HDL", "LDLCALC", "TRIG", "CHOLHDL", "LDLDIRECT" in the last 72 hours. Thyroid function studies No results for input(s): "TSH", "T4TOTAL", "T3FREE", "THYROIDAB" in the last 72 hours.  Invalid input(s): "FREET3" Anemia work up Recent Labs    03/04/22 0511 03/05/22 0341  FERRITIN 168 246   Urinalysis    Component Value Date/Time   COLORURINE YELLOW 03/02/2022 1920   APPEARANCEUR CLEAR 03/02/2022 1920   LABSPEC 1.013 03/02/2022 1920   PHURINE 6.0 03/02/2022 1920   GLUCOSEU NEGATIVE 03/02/2022 1920   HGBUR SMALL (A) 03/02/2022 1920   BILIRUBINUR NEGATIVE 03/02/2022 1920   KETONESUR NEGATIVE 03/02/2022 1920   PROTEINUR >=300 (A)  03/02/2022 1920   UROBILINOGEN 1.0 07/11/2014 2325   NITRITE NEGATIVE 03/02/2022 1920   LEUKOCYTESUR NEGATIVE 03/02/2022 1920   Sepsis Labs Recent Labs  Lab 03/02/22 2013 03/04/22 0511 03/05/22 0341  WBC 8.9 5.9 5.2   Microbiology Recent Results (from the past 240 hour(s))  SARS Coronavirus 2 by RT PCR (hospital order, performed in [place] hospital lab) *cepheid single result test* Anterior Nasal Swab     Status: Abnormal   Collection Time: 03/02/22  7:20 PM   Specimen: Anterior Nasal Swab  Result Value Ref Range Status   SARS Coronavirus 2 by RT PCR POSITIVE (A) NEGATIVE Final    Comment: (NOTE) SARS-CoV-2 target nucleic acids are DETECTED  SARS-CoV-2 RNA is generally detectable in upper respiratory specimens  during the acute phase of infection.  Positive results are indicative  of the presence of the identified virus, but do not rule out bacterial infection or co-infection with other pathogens not detected by the test.  Clinical correlation with patient history and  other diagnostic information is necessary to determine patient infection status.  The expected result is negative.  Fact Sheet for Patients:   https://www.patel.info/   Fact Sheet for Healthcare Providers:   https://hall.com/    This test is not yet approved or cleared by the [place] FDA and  has been authorized for detection and/or diagnosis of SARS-CoV-2 by FDA under an Emergency Use Authorization (EUA).  This EUA will remain in effect (meaning this test can be used) for the duration of  the COVID-19 declaration under Section 564(b)(1)  of the Act, 21 U.S.C. section 360-bbb-3(b)(1), unless the authorization is terminated or revoked sooner.   Performed at [facility], [address] [place], [place] [address]      Time coordinating discharge: 35 minutes  SIGNED:   Rodena Goldmann,  DO [facility] 03/05/2022, 9:05 AM  If 7PM-7AM, please  contact night-coverage [email or web address]

## 2022-03-05 NOTE — Progress Notes (Signed)
Patient arrived from ICU. Respirations non labored. Patient requested that I call her son and ask  him to bring her husband here to stay with her. She stated she is ready to go home. Called son and told him of her request.

## 2022-03-05 NOTE — Care Management Important Message (Signed)
Important Message  Patient Details  Name: [name] MRN: [id number] Date of Birth: 17-Nov-1926   Medicare Important Message Given:  N/A - LOS <3 / Initial given by admissions     Tommy Medal 03/05/2022, 10:16 AM

## 2022-03-05 NOTE — Progress Notes (Signed)
Diastolic pressure 44 manually. Informed MD. MD stated it was ok to hold 0600 Hydralazine 50mg  this am.

## 2022-03-08 ENCOUNTER — Telehealth: Payer: Self-pay | Admitting: Family Medicine

## 2022-03-08 ENCOUNTER — Encounter (INDEPENDENT_AMBULATORY_CARE_PROVIDER_SITE_OTHER): Payer: Medicare HMO | Admitting: Ophthalmology

## 2022-03-08 NOTE — Telephone Encounter (Signed)
FYI-[name] from Hospice calling to inform Dr.Scott that pt recently in hospital for [name]. They stopper her Diltiazem and placed pt on Absorbic Acid, Hydralaziene 50 mg, and Zinc. Santiago Glad states pt has completed COVID med.

## 2022-03-15 ENCOUNTER — Encounter (INDEPENDENT_AMBULATORY_CARE_PROVIDER_SITE_OTHER): Admitting: Ophthalmology

## 2022-03-15 DIAGNOSIS — H35041 Retinal micro-aneurysms, unspecified, right eye: Secondary | ICD-10-CM | POA: Diagnosis not present

## 2022-03-15 DIAGNOSIS — H3561 Retinal hemorrhage, right eye: Secondary | ICD-10-CM | POA: Diagnosis not present

## 2022-03-16 ENCOUNTER — Ambulatory Visit: Payer: Medicare HMO

## 2022-03-16 ENCOUNTER — Telehealth: Payer: Self-pay

## 2022-03-16 DIAGNOSIS — I495 Sick sinus syndrome: Secondary | ICD-10-CM

## 2022-03-16 LAB — CUP PACEART REMOTE DEVICE CHECK
Battery Remaining Longevity: 112 mo
Battery Remaining Percentage: 87 %
Battery Voltage: 3.02 V
Brady Statistic RV Percent Paced: 38 %
Date Time Interrogation Session: 20231010113854
Implantable Lead Implant Date: 20100602
Implantable Lead Implant Date: 20100602
Implantable Lead Location: 753859
Implantable Lead Location: 753860
Implantable Pulse Generator Implant Date: 20211011
Lead Channel Impedance Value: 430 Ohm
Lead Channel Pacing Threshold Amplitude: 1 V
Lead Channel Pacing Threshold Pulse Width: 0.4 ms
Lead Channel Sensing Intrinsic Amplitude: 12 mV
Lead Channel Setting Pacing Amplitude: 2.5 V
Lead Channel Setting Pacing Pulse Width: 0.4 ms
Lead Channel Setting Sensing Sensitivity: 2 mV
Pulse Gen Model: 2272
Pulse Gen Serial Number: 6220844

## 2022-03-16 NOTE — Telephone Encounter (Signed)
May reduce from 2 tabs bid to 1 tab twice a day

## 2022-03-16 NOTE — Telephone Encounter (Signed)
Spoke with [name] on dpr to inform per drs notes and recommendations.

## 2022-03-16 NOTE — Telephone Encounter (Signed)
Caller name:Gaynelle Philipp Ovens   On DPR? :yes  Call back number:[phone]  Provider they see: :Luking   Reason for call:Pt taking SENNA-PLUS 8.6-50 MG tablet two twice daily and that makes her stomach hurt can she decrease to lower dose

## 2022-03-19 NOTE — Telephone Encounter (Signed)
Left message to return call 

## 2022-03-19 NOTE — Telephone Encounter (Signed)
Spoke with patient's daughter Harmon Pier she states patient has been having stomach pain after she takes her stool softener even with decrease dose this week, pain is present about half the day . BM are regular and soft , no nausea or vomiting , under the care of [facility]. Please advise.

## 2022-03-19 NOTE — Telephone Encounter (Signed)
I recommend at this point I will consult the hospice nurse to give a house visit next week and see what is going on with the patient then we can go from there (Family can call hospice nurse if needed over the weekend otherwise nurses-please notify hospice nurse on Monday)

## 2022-03-19 NOTE — Telephone Encounter (Signed)
Pt daughter calling back she is still have stomach pains from just taking one and wants advice

## 2022-03-22 NOTE — Telephone Encounter (Signed)
Pt daughter [name] returned call and verbalized understanding. Pt also wanted to schedule a follow up with PCP. Pt daughter transferred up front to schedule follow up.

## 2022-03-25 ENCOUNTER — Ambulatory Visit (INDEPENDENT_AMBULATORY_CARE_PROVIDER_SITE_OTHER): Payer: Medicare Other | Admitting: Family Medicine

## 2022-03-25 VITALS — BP 120/78 | HR 89 | Temp 95.7°F | Ht 62.0 in | Wt 129.0 lb

## 2022-03-25 DIAGNOSIS — Z23 Encounter for immunization: Secondary | ICD-10-CM | POA: Diagnosis not present

## 2022-03-25 DIAGNOSIS — R5382 Chronic fatigue, unspecified: Secondary | ICD-10-CM | POA: Diagnosis not present

## 2022-03-25 MED ORDER — DILTIAZEM HCL ER COATED BEADS 300 MG PO CP24: 300.0000 mg | ORAL_CAPSULE | Freq: Every day | ORAL | 5 refills | Status: DC

## 2022-03-25 MED ORDER — HYDRALAZINE HCL 25 MG PO TABS: 25.0000 mg | ORAL_TABLET | Freq: Three times a day (TID) | ORAL | 3 refills | Status: DC

## 2022-03-25 NOTE — Progress Notes (Signed)
   Subjective:    Patient ID: [name], female    DOB: 07/12/1926, 88 y.o.   MRN: [id number]  HPI Patient with some fatigue tiredness not sleeping as much at night but sleeping more during the day appetite not as good weight has gone down energy level gone down   Review of Systems     Objective:   Physical Exam  General-in no acute distress Eyes-no discharge Lungs-respiratory rate normal, CTA CV-no murmurs,RRR Extremities skin warm dry no edema Neuro grossly normal Behavior normal, alert       Assessment & Plan:  Patient under the care of hospice We will adjust downward on her blood pressure medicine and Cardizem because her blood pressure is low today having fatigue tiredness We will do a follow-up phone visit in 4 to 6 weeks

## 2022-03-26 ENCOUNTER — Telehealth: Payer: Self-pay

## 2022-03-26 NOTE — Telephone Encounter (Signed)
Daughter stated that they got all her medications straight with hospice and everything is ok

## 2022-03-26 NOTE — Telephone Encounter (Signed)
Caller name:Analissa Philipp Ovens   On DPR? :Yes  Call back number:[phone]  Provider they see: Wolfgang Phoenix   Reason for call:Pt is calling wanting clarification on all medication her daughter wants a call back [phone]

## 2022-03-30 NOTE — Progress Notes (Signed)
03/30/22-left message for [name] to return call to get hospice nurse info

## 2022-03-30 NOTE — Progress Notes (Signed)
Remote pacemaker transmission.   

## 2022-04-06 NOTE — Progress Notes (Signed)
04/06/22-sent my chart message to daughter [name]

## 2022-04-09 ENCOUNTER — Telehealth: Payer: Self-pay | Admitting: *Deleted

## 2022-04-09 NOTE — Patient Outreach (Signed)
  Care Coordination   04/09/2022 Name: [name] MRN: [id number] DOB: Jan 25, 1927   Care Coordination Outreach Attempts:  An unsuccessful telephone outreach was attempted today to offer the patient information about available care coordination services as a benefit of their health plan.   Follow Up Plan:  Additional outreach attempts will be made to offer the patient care coordination information and services.   Encounter Outcome:  No Answer  Care Coordination Interventions Activated:  No   Care Coordination Interventions:  No, not indicated    SIG Annette Liotta C. Myrtie Neither, MSN, [facility] Gerontological Nurse Practitioner [facility] Care Management [phone]

## 2022-04-12 ENCOUNTER — Encounter (INDEPENDENT_AMBULATORY_CARE_PROVIDER_SITE_OTHER): Payer: Medicare HMO | Admitting: Ophthalmology

## 2022-04-15 ENCOUNTER — Encounter (INDEPENDENT_AMBULATORY_CARE_PROVIDER_SITE_OTHER): Admitting: Ophthalmology

## 2022-04-15 DIAGNOSIS — H3509 Other intraretinal microvascular abnormalities: Secondary | ICD-10-CM

## 2022-04-15 DIAGNOSIS — H35031 Hypertensive retinopathy, right eye: Secondary | ICD-10-CM

## 2022-04-15 DIAGNOSIS — H3581 Retinal edema: Secondary | ICD-10-CM

## 2022-04-15 DIAGNOSIS — I10 Essential (primary) hypertension: Secondary | ICD-10-CM

## 2022-04-15 DIAGNOSIS — H43811 Vitreous degeneration, right eye: Secondary | ICD-10-CM

## 2022-04-26 ENCOUNTER — Encounter: Payer: Medicare HMO | Admitting: Nurse Practitioner

## 2022-05-04 ENCOUNTER — Ambulatory Visit: Payer: Medicare HMO | Admitting: Family Medicine

## 2022-05-06 ENCOUNTER — Ambulatory Visit: Payer: Medicare HMO | Admitting: Family Medicine

## 2022-05-07 ENCOUNTER — Ambulatory Visit: Payer: Medicare HMO | Admitting: Family Medicine

## 2022-05-13 ENCOUNTER — Encounter (INDEPENDENT_AMBULATORY_CARE_PROVIDER_SITE_OTHER): Payer: Medicare HMO | Admitting: Ophthalmology

## 2022-05-13 DIAGNOSIS — H3581 Retinal edema: Secondary | ICD-10-CM

## 2022-05-13 DIAGNOSIS — H3509 Other intraretinal microvascular abnormalities: Secondary | ICD-10-CM

## 2022-05-13 DIAGNOSIS — H35031 Hypertensive retinopathy, right eye: Secondary | ICD-10-CM

## 2022-05-13 DIAGNOSIS — I10 Essential (primary) hypertension: Secondary | ICD-10-CM

## 2022-05-13 DIAGNOSIS — H43811 Vitreous degeneration, right eye: Secondary | ICD-10-CM | POA: Diagnosis not present

## 2022-06-10 ENCOUNTER — Encounter (INDEPENDENT_AMBULATORY_CARE_PROVIDER_SITE_OTHER): Payer: Medicare HMO | Admitting: Ophthalmology

## 2022-06-10 DIAGNOSIS — I10 Essential (primary) hypertension: Secondary | ICD-10-CM | POA: Diagnosis not present

## 2022-06-10 DIAGNOSIS — H43811 Vitreous degeneration, right eye: Secondary | ICD-10-CM

## 2022-06-10 DIAGNOSIS — H35031 Hypertensive retinopathy, right eye: Secondary | ICD-10-CM

## 2022-06-10 DIAGNOSIS — H3581 Retinal edema: Secondary | ICD-10-CM | POA: Diagnosis not present

## 2022-06-15 ENCOUNTER — Ambulatory Visit (INDEPENDENT_AMBULATORY_CARE_PROVIDER_SITE_OTHER): Payer: Medicare HMO

## 2022-06-15 DIAGNOSIS — I495 Sick sinus syndrome: Secondary | ICD-10-CM | POA: Diagnosis not present

## 2022-06-15 LAB — CUP PACEART REMOTE DEVICE CHECK
Battery Remaining Longevity: 109 mo
Battery Remaining Percentage: 85 %
Battery Voltage: 3.02 V
Brady Statistic RV Percent Paced: 33 %
Date Time Interrogation Session: 20240109040016
Implantable Lead Connection Status: 753985
Implantable Lead Connection Status: 753985
Implantable Lead Implant Date: 20100602
Implantable Lead Implant Date: 20100602
Implantable Lead Location: 753859
Implantable Lead Location: 753860
Implantable Pulse Generator Implant Date: 20211011
Lead Channel Impedance Value: 400 Ohm
Lead Channel Pacing Threshold Amplitude: 1 V
Lead Channel Pacing Threshold Pulse Width: 0.4 ms
Lead Channel Sensing Intrinsic Amplitude: 12 mV
Lead Channel Setting Pacing Amplitude: 2.5 V
Lead Channel Setting Pacing Pulse Width: 0.4 ms
Lead Channel Setting Sensing Sensitivity: 2 mV
Pulse Gen Model: 2272
Pulse Gen Serial Number: 6220844

## 2022-06-29 ENCOUNTER — Encounter: Payer: Medicare HMO | Admitting: Internal Medicine

## 2022-07-05 DIAGNOSIS — I70209 Unspecified atherosclerosis of native arteries of extremities, unspecified extremity: Secondary | ICD-10-CM | POA: Diagnosis not present

## 2022-07-05 DIAGNOSIS — M19071 Primary osteoarthritis, right ankle and foot: Secondary | ICD-10-CM | POA: Diagnosis not present

## 2022-07-05 DIAGNOSIS — M19072 Primary osteoarthritis, left ankle and foot: Secondary | ICD-10-CM | POA: Diagnosis not present

## 2022-07-05 DIAGNOSIS — L84 Corns and callosities: Secondary | ICD-10-CM | POA: Diagnosis not present

## 2022-07-05 DIAGNOSIS — I739 Peripheral vascular disease, unspecified: Secondary | ICD-10-CM | POA: Diagnosis not present

## 2022-07-05 DIAGNOSIS — B351 Tinea unguium: Secondary | ICD-10-CM | POA: Diagnosis not present

## 2022-07-08 NOTE — Progress Notes (Signed)
Remote pacemaker transmission.   

## 2022-07-15 ENCOUNTER — Encounter (HOSPITAL_COMMUNITY): Payer: Self-pay | Admitting: *Deleted

## 2022-07-22 ENCOUNTER — Encounter: Payer: Medicare HMO | Admitting: Internal Medicine

## 2022-07-29 ENCOUNTER — Encounter (INDEPENDENT_AMBULATORY_CARE_PROVIDER_SITE_OTHER): Payer: Medicare HMO | Admitting: Ophthalmology

## 2022-07-29 ENCOUNTER — Ambulatory Visit: Attending: Internal Medicine | Admitting: Internal Medicine

## 2022-07-29 DIAGNOSIS — I10 Essential (primary) hypertension: Secondary | ICD-10-CM | POA: Diagnosis not present

## 2022-07-29 DIAGNOSIS — H3509 Other intraretinal microvascular abnormalities: Secondary | ICD-10-CM | POA: Diagnosis not present

## 2022-07-29 DIAGNOSIS — I4811 Longstanding persistent atrial fibrillation: Secondary | ICD-10-CM

## 2022-07-29 DIAGNOSIS — H35031 Hypertensive retinopathy, right eye: Secondary | ICD-10-CM | POA: Diagnosis not present

## 2022-07-29 DIAGNOSIS — Z950 Presence of cardiac pacemaker: Secondary | ICD-10-CM

## 2022-07-29 DIAGNOSIS — R001 Bradycardia, unspecified: Secondary | ICD-10-CM

## 2022-08-02 ENCOUNTER — Telehealth: Payer: Self-pay | Admitting: Family Medicine

## 2022-08-02 MED ORDER — HYDRALAZINE HCL 25 MG PO TABS: 25.0000 mg | ORAL_TABLET | Freq: Three times a day (TID) | ORAL | 5 refills | Status: DC

## 2022-08-02 NOTE — Telephone Encounter (Signed)
Prescription sent electronically to pharmacy. 

## 2022-08-02 NOTE — Telephone Encounter (Signed)
6 rf

## 2022-08-02 NOTE — Telephone Encounter (Signed)
6rf

## 2022-08-02 NOTE — Telephone Encounter (Signed)
Hospice nurse called stating patient needs refill hydrALAZINE (APRESOLINE) 25 MG tablet is completely out sent [facility]

## 2022-09-13 ENCOUNTER — Telehealth: Payer: Self-pay

## 2022-09-13 NOTE — Telephone Encounter (Signed)
Clydie Braun from [facility] called and stated that patient has not been taken her diltiazem and her blood pressures have been running 146/68 last week and 122/66 today and her HR has been running 80,85, 78, 75 the last reading. Per Dr Lorin Picket advised Clydie Braun patient can stay off of diltiazem. Please notify office if HR increase to 100's.

## 2022-09-14 ENCOUNTER — Ambulatory Visit (INDEPENDENT_AMBULATORY_CARE_PROVIDER_SITE_OTHER): Payer: Medicare HMO

## 2022-09-14 DIAGNOSIS — I495 Sick sinus syndrome: Secondary | ICD-10-CM | POA: Diagnosis not present

## 2022-09-14 LAB — CUP PACEART REMOTE DEVICE CHECK
Battery Remaining Longevity: 106 mo
Battery Remaining Percentage: 84 %
Battery Voltage: 3.02 V
Brady Statistic RV Percent Paced: 30 %
Date Time Interrogation Session: 20240409050310
Implantable Lead Connection Status: 753985
Implantable Lead Connection Status: 753985
Implantable Lead Implant Date: 20100602
Implantable Lead Implant Date: 20100602
Implantable Lead Location: 753859
Implantable Lead Location: 753860
Implantable Pulse Generator Implant Date: 20211011
Lead Channel Impedance Value: 380 Ohm
Lead Channel Pacing Threshold Amplitude: 1 V
Lead Channel Pacing Threshold Pulse Width: 0.4 ms
Lead Channel Sensing Intrinsic Amplitude: 12 mV
Lead Channel Setting Pacing Amplitude: 2.5 V
Lead Channel Setting Pacing Pulse Width: 0.4 ms
Lead Channel Setting Sensing Sensitivity: 2 mV
Pulse Gen Model: 2272
Pulse Gen Serial Number: 6220844

## 2022-09-16 ENCOUNTER — Ambulatory Visit: Payer: Self-pay | Admitting: *Deleted

## 2022-09-16 ENCOUNTER — Encounter: Payer: Self-pay | Admitting: *Deleted

## 2022-09-16 NOTE — Patient Instructions (Signed)
Visit Information  Thank you for taking time to visit with me today. Please don't hesitate to contact me if I can be of assistance to you.   Please call the care guide team at [phone] if you need to cancel or reschedule your appointment.   If you are experiencing a Mental Health or Behavioral Health Crisis or need someone to talk to, please call the Suicide and Crisis Lifeline: 988 call the [place] National Suicide Prevention Lifeline: 1-800-273-8255 or TTY: 1-800-799-4 TTY (1-800-799-4889) to talk to a trained counselor call 1-800-273-TALK (toll free, 24 hour hotline) go to [facility] [address], [place] ([phone]) call the [place] Crisis Line: 800-939-9988 call 911  Patient verbalizes understanding of instructions and care plan provided today and agrees to view in MyChart. Active MyChart status and patient understanding of how to access instructions and care plan via MyChart confirmed with patient.     No further follow up required.  Oneil Behney, BSW, MSW, LCSW  Licensed Clinical Social Worker  [facility] Care Management [place] [facility]  Mailing Address-[address], [place], [place] [address] Physical Address-[address], [place], [place] [address] Toll Free Main # [phone] Fax # [phone] Cell # [phone] [email or web address]            

## 2022-09-16 NOTE — Patient Outreach (Signed)
  Care Coordination   Initial Visit Note   09/16/2022  Name: [name] MRN: [id number] DOB: [birth year]  [name] is a 89 y.o. year old female who sees Luking, Jonna Coup, MD for primary care. I spoke with [name] and daughter, [name] by phone today.  What matters to the patients health and wellness today?  No interventions identified.  Patient and daughter deny need for social work involvement at this time.  SDOH assessments and interventions completed:  Yes.  SDOH Interventions Today    Flowsheet Row Most Recent Value  SDOH Interventions   Food Insecurity Interventions Intervention Not Indicated  [Verified by Daughter, [name]]  Housing Interventions Intervention Not Indicated  [Verified by Daughter, [name]]  Transportation Interventions Patient Resources Dietitian), Intervention Not Indicated, Payor Benefit  [Verified by Daughter, [name]]  Utilities Interventions Intervention Not Indicated  [Verified by Daughter, [name]]  Alcohol Usage Interventions Intervention Not Indicated (Score <7)  [Verified by Daughter, [name]]  Financial Strain Interventions Intervention Not Indicated  [Verified by Daughter, [name]]  Physical Activity Interventions Intervention Not Indicated  [Verified by Daughter, [name]]  Stress Interventions Intervention Not Indicated  [Verified by Daughter, [name]]  Social Connections Interventions Intervention Not Indicated  [Verified by Daughter, [name]]     Care Coordination Interventions:  No, not indicated.   Follow up plan: No further intervention required.   Encounter Outcome:  Pt. Visit Completed.   Danford Bad, BSW, MSW, LCSW  Licensed Restaurant manager, [facility]  Mailing [place] [address] [place], [place] [address] Physical Address-[address], [place], [place] [address] Toll Free Main #  [phone] Fax # [phone] Cell # [phone] [email or web address]

## 2022-10-22 NOTE — Progress Notes (Signed)
Remote pacemaker transmission.   

## 2022-12-14 ENCOUNTER — Ambulatory Visit (INDEPENDENT_AMBULATORY_CARE_PROVIDER_SITE_OTHER): Payer: Medicare HMO

## 2022-12-14 DIAGNOSIS — I495 Sick sinus syndrome: Secondary | ICD-10-CM | POA: Diagnosis not present

## 2022-12-14 LAB — CUP PACEART REMOTE DEVICE CHECK
Battery Remaining Longevity: 104 mo
Battery Remaining Percentage: 82 %
Battery Voltage: 3.02 V
Brady Statistic RV Percent Paced: 27 %
Date Time Interrogation Session: 20240709054538
Implantable Lead Connection Status: 753985
Implantable Lead Connection Status: 753985
Implantable Lead Implant Date: 20100602
Implantable Lead Implant Date: 20100602
Implantable Lead Location: 753859
Implantable Lead Location: 753860
Implantable Pulse Generator Implant Date: 20211011
Lead Channel Impedance Value: 390 Ohm
Lead Channel Pacing Threshold Amplitude: 1 V
Lead Channel Pacing Threshold Pulse Width: 0.4 ms
Lead Channel Sensing Intrinsic Amplitude: 12 mV
Lead Channel Setting Pacing Amplitude: 2.5 V
Lead Channel Setting Pacing Pulse Width: 0.4 ms
Lead Channel Setting Sensing Sensitivity: 2 mV
Pulse Gen Model: 2272
Pulse Gen Serial Number: 6220844

## 2023-01-06 NOTE — Progress Notes (Signed)
Remote pacemaker transmission.   

## 2023-01-17 ENCOUNTER — Telehealth: Payer: Self-pay

## 2023-01-17 NOTE — Telephone Encounter (Signed)
Called and spoke with [name] [facility] [phone]). Verbal orders given With a gout flareup I would recommend prednisone 20 mg 2 tablets daily for 5 days, #10 no refill. May also use colchicine 0.6 mg 1 taken twice daily till relief, #20 with 1 refill. [name] verbalized understanding of verbal orders.

## 2023-01-17 NOTE — Telephone Encounter (Signed)
With a gout flareup I would recommend prednisone 20 mg 2 tablets daily for 5 days, #10 no refill May also use colchicine 0.6 mg 1 taken twice daily till relief, #20 with 1 refill

## 2023-01-17 NOTE — Telephone Encounter (Signed)
[name] from [facility] called patient has had a gout flare up. [name] would like to know if patient could have something for it. [name] states that patient has been Allopurinol 100 mg at one time. [name] suggested a 15 day supply if possible.

## 2023-01-27 ENCOUNTER — Encounter (INDEPENDENT_AMBULATORY_CARE_PROVIDER_SITE_OTHER): Payer: Medicare HMO | Admitting: Ophthalmology

## 2023-01-27 DIAGNOSIS — I10 Essential (primary) hypertension: Secondary | ICD-10-CM

## 2023-01-27 DIAGNOSIS — H35031 Hypertensive retinopathy, right eye: Secondary | ICD-10-CM

## 2023-01-27 DIAGNOSIS — H3509 Other intraretinal microvascular abnormalities: Secondary | ICD-10-CM

## 2023-02-08 ENCOUNTER — Telehealth: Payer: Self-pay | Admitting: Family Medicine

## 2023-02-08 MED ORDER — HYDRALAZINE HCL 25 MG PO TABS: 25.0000 mg | ORAL_TABLET | Freq: Three times a day (TID) | ORAL | 1 refills | Status: DC

## 2023-02-08 NOTE — Telephone Encounter (Signed)
Hospice calling for patient refill on hydrALAZINE (APRESOLINE) 25 MG tablet . Send to [facility]

## 2023-03-07 ENCOUNTER — Telehealth: Payer: Self-pay

## 2023-03-07 NOTE — Telephone Encounter (Signed)
Nurse Clydie Braun (hospice) called and stated that the patient would like flu shot and Clydie Braun needs order for flu shot sent to [facility]. Script on Dr [employer] desk to be signed.

## 2023-03-08 ENCOUNTER — Other Ambulatory Visit: Payer: Self-pay

## 2023-03-08 ENCOUNTER — Telehealth: Payer: Self-pay | Admitting: Family Medicine

## 2023-03-08 ENCOUNTER — Emergency Department (HOSPITAL_COMMUNITY)

## 2023-03-08 ENCOUNTER — Emergency Department (HOSPITAL_COMMUNITY)
Admission: EM | Admit: 2023-03-08 | Discharge: 2023-03-09 | Disposition: A | Discharge status: Home / Self Care | Attending: Emergency Medicine | Admitting: Emergency Medicine

## 2023-03-08 DIAGNOSIS — R1010 Upper abdominal pain, unspecified: Secondary | ICD-10-CM

## 2023-03-08 DIAGNOSIS — J9811 Atelectasis: Secondary | ICD-10-CM | POA: Diagnosis not present

## 2023-03-08 DIAGNOSIS — E1122 Type 2 diabetes mellitus with diabetic chronic kidney disease: Secondary | ICD-10-CM | POA: Diagnosis not present

## 2023-03-08 DIAGNOSIS — R1111 Vomiting without nausea: Secondary | ICD-10-CM | POA: Diagnosis not present

## 2023-03-08 DIAGNOSIS — R1013 Epigastric pain: Secondary | ICD-10-CM | POA: Diagnosis not present

## 2023-03-08 DIAGNOSIS — I129 Hypertensive chronic kidney disease with stage 1 through stage 4 chronic kidney disease, or unspecified chronic kidney disease: Secondary | ICD-10-CM | POA: Diagnosis not present

## 2023-03-08 DIAGNOSIS — R1110 Vomiting, unspecified: Secondary | ICD-10-CM | POA: Diagnosis not present

## 2023-03-08 DIAGNOSIS — R112 Nausea with vomiting, unspecified: Secondary | ICD-10-CM

## 2023-03-08 DIAGNOSIS — J90 Pleural effusion, not elsewhere classified: Secondary | ICD-10-CM | POA: Diagnosis not present

## 2023-03-08 DIAGNOSIS — K828 Other specified diseases of gallbladder: Secondary | ICD-10-CM | POA: Diagnosis not present

## 2023-03-08 DIAGNOSIS — K8020 Calculus of gallbladder without cholecystitis without obstruction: Secondary | ICD-10-CM

## 2023-03-08 DIAGNOSIS — I10 Essential (primary) hypertension: Secondary | ICD-10-CM | POA: Diagnosis not present

## 2023-03-08 DIAGNOSIS — N200 Calculus of kidney: Secondary | ICD-10-CM | POA: Diagnosis not present

## 2023-03-08 DIAGNOSIS — R0689 Other abnormalities of breathing: Secondary | ICD-10-CM | POA: Diagnosis not present

## 2023-03-08 DIAGNOSIS — N189 Chronic kidney disease, unspecified: Secondary | ICD-10-CM

## 2023-03-08 DIAGNOSIS — R197 Diarrhea, unspecified: Secondary | ICD-10-CM | POA: Diagnosis not present

## 2023-03-08 DIAGNOSIS — D259 Leiomyoma of uterus, unspecified: Secondary | ICD-10-CM | POA: Diagnosis not present

## 2023-03-08 LAB — CBC WITH DIFFERENTIAL/PLATELET
Abs Immature Granulocytes: 0.02 10*3/uL (ref 0.00–0.07)
Basophils Absolute: 0 10*3/uL (ref 0.0–0.1)
Basophils Relative: 0 %
Eosinophils Absolute: 0 10*3/uL (ref 0.0–0.5)
Eosinophils Relative: 0 %
HCT: 35.1 % — ABNORMAL LOW (ref 36.0–46.0)
Hemoglobin: 11 g/dL — ABNORMAL LOW (ref 12.0–15.0)
Immature Granulocytes: 0 %
Lymphocytes Relative: 8 %
Lymphs Abs: 0.7 10*3/uL (ref 0.7–4.0)
MCH: 29.6 pg (ref 26.0–34.0)
MCHC: 31.3 g/dL (ref 30.0–36.0)
MCV: 94.6 fL (ref 80.0–100.0)
Monocytes Absolute: 0.5 10*3/uL (ref 0.1–1.0)
Monocytes Relative: 6 %
Neutro Abs: 6.9 10*3/uL (ref 1.7–7.7)
Neutrophils Relative %: 86 %
Platelets: 158 10*3/uL (ref 150–400)
RBC: 3.71 MIL/uL — ABNORMAL LOW (ref 3.87–5.11)
RDW: 13.8 % (ref 11.5–15.5)
WBC: 8.1 10*3/uL (ref 4.0–10.5)
nRBC: 0 % (ref 0.0–0.2)

## 2023-03-08 LAB — TROPONIN I (HIGH SENSITIVITY)
Troponin I (High Sensitivity): 37 ng/L — ABNORMAL HIGH (ref <18)
Troponin I (High Sensitivity): 29 ng/L — ABNORMAL HIGH (ref <18)

## 2023-03-08 LAB — LACTIC ACID, PLASMA: Lactic Acid, Venous: 1.3 mmol/L (ref 0.5–1.9)

## 2023-03-08 LAB — LIPASE, BLOOD: Lipase: 43 U/L (ref 11–51)

## 2023-03-08 LAB — COMPREHENSIVE METABOLIC PANEL
ALT: 17 U/L (ref 0–44)
AST: 24 U/L (ref 15–41)
Albumin: 4.3 g/dL (ref 3.5–5.0)
Alkaline Phosphatase: 78 U/L (ref 38–126)
Anion gap: 13 (ref 5–15)
BUN: 59 mg/dL — ABNORMAL HIGH (ref 8–23)
CO2: 24 mmol/L (ref 22–32)
Calcium: 10.1 mg/dL (ref 8.9–10.3)
Chloride: 103 mmol/L (ref 98–111)
Creatinine, Ser: 2.79 mg/dL — ABNORMAL HIGH (ref 0.44–1.00)
GFR, Estimated: 15 mL/min — ABNORMAL LOW (ref >60)
Glucose, Bld: 169 mg/dL — ABNORMAL HIGH (ref 70–99)
Potassium: 5 mmol/L (ref 3.5–5.1)
Sodium: 140 mmol/L (ref 135–145)
Total Bilirubin: 0.5 mg/dL (ref 0.3–1.2)
Total Protein: 7.3 g/dL (ref 6.5–8.1)

## 2023-03-08 MED ORDER — ONDANSETRON HCL 4 MG/2ML IJ SOLN
4.0000 mg | Freq: Once | INTRAMUSCULAR | Status: AC
  Administered 2023-03-08: 4 mg via INTRAVENOUS
  Filled 2023-03-08: qty 2

## 2023-03-08 MED ORDER — PANTOPRAZOLE SODIUM 40 MG IV SOLR
40.0000 mg | Freq: Once | INTRAVENOUS | Status: AC
  Administered 2023-03-08: 40 mg via INTRAVENOUS
  Filled 2023-03-08: qty 10

## 2023-03-08 MED ORDER — SODIUM CHLORIDE 0.9 % IV BOLUS
1000.0000 mL | Freq: Once | INTRAVENOUS | Status: AC
  Administered 2023-03-08: 1000 mL via INTRAVENOUS

## 2023-03-08 NOTE — ED Triage Notes (Signed)
Patient from home for reports of vomiting following eating spaghetti tonight. Upon arrival to ER, patient is alert and oriented; reports upper abd pain 8/10. Patient actively vomiting

## 2023-03-08 NOTE — Telephone Encounter (Signed)
Hopsice called requesting refill on toremide 20 mg send to [facility]

## 2023-03-08 NOTE — ED Provider Notes (Signed)
[place] EMERGENCY DEPARTMENT AT [facility] Provider Note   CSN: [id number] Arrival date & time: 03/08/23  1902     History {Add pertinent medical, surgical, social history, OB history to HPI:1} Chief Complaint  Patient presents with   Emesis    [name] is a 89 y.o. female.  Patient was brought in by EMS for acute onset of upper abdominal pain nausea and vomiting after eating spaghetti dinner tonight.  She said she still has pain although better than initially.  She is also had a little bit of diarrhea.  No cough or shortness of breath no fever.  She is on home oxygen.    The history is provided by the patient, the EMS personnel and a relative.  Abdominal Pain Pain location:  Epigastric Pain quality: cramping   Pain severity:  Moderate Onset quality:  Sudden Duration:  2 hours Timing:  Intermittent Progression:  Unchanged Chronicity:  New Context: eating   Relieved by:  None tried Worsened by:  Nothing Ineffective treatments:  None tried Associated symptoms: diarrhea, nausea and vomiting   Associated symptoms: no cough, no fever and no shortness of breath        Home Medications Prior to Admission medications   Medication Sig Start Date End Date Taking? Authorizing Provider  albuterol (VENTOLIN HFA) 108 (90 Base) MCG/ACT inhaler TAKE 2 PUFFS EVERY 6 HOURS AS NEEDED Patient taking differently: Inhale 2 puffs into the lungs every 6 (six) hours as needed for wheezing or shortness of breath. TAKE 2 PUFFS EVERY 6 HOURS AS NEEDED 10/23/20   Babs Sciara, MD  ciprofloxacin (CILOXAN) 0.3 % ophthalmic solution  01/11/22   [provider]  diltiazem (CARDIZEM CD) 300 MG 24 hr capsule Take 1 capsule (300 mg total) by mouth daily. 03/25/22   Luking, Jonna Coup, MD  GOODSENSE TUSSIN DM 20-200 MG/20ML LIQD Take 10 mLs by mouth every 4 (four) hours as needed. 03/05/22   [provider]  guaiFENesin-dextromethorphan (ROBITUSSIN DM) 100-10 MG/5ML syrup  Take 10 mLs by mouth every 4 (four) hours as needed for cough. 03/05/22   Sherryll Burger, Pratik D, DO  hydrALAZINE (APRESOLINE) 25 MG tablet Take 1 tablet (25 mg total) by mouth 3 (three) times daily. 02/08/23 08/07/23  Babs Sciara, MD  mupirocin ointment (BACTROBAN) 2 % Apply thin amount daily to skin blister Patient not taking: Reported on 03/02/2022 07/14/20   Babs Sciara, MD  OXYGEN Inhale 2 L into the lungs continuous.    [provider]  pravastatin (PRAVACHOL) 80 MG tablet TAKE (1) TABLET BY MOUTH AT BEDTIME. Patient taking differently: Take 80 mg by mouth at bedtime. TAKE (1) TABLET BY MOUTH AT BEDTIME. 05/21/20   Babs Sciara, MD  prednisoLONE acetate (PRED FORTE) 1 % ophthalmic suspension Place 1 drop into the right eye 4 (four) times daily. 02/22/22   [provider]  SENNA-PLUS 8.6-50 MG tablet Take by mouth 2 (two) times daily. 11/23/21   [provider]  torsemide (DEMADEX) 20 MG tablet TAKE 3 TABLETS BY MOUTH DAILY Patient taking differently: Take 20 mg by mouth daily. 08/03/21   Babs Sciara, MD  Travoprost, BAK Free, (TRAVATAN) 0.004 % SOLN ophthalmic solution Place 1 drop into both eyes at bedtime.     [provider]      Allergies    Tramadol    Review of Systems   Review of Systems  Constitutional:  Negative for fever.  Respiratory:  Negative for  cough and shortness of breath.   Gastrointestinal:  Positive for abdominal pain, diarrhea, nausea and vomiting.    Physical Exam Updated Vital Signs BP (!) 162/105 (BP Location: Left Arm)   Pulse 83   Temp 97.6 F (36.4 C) (Oral)   Resp 20   SpO2 100%  Physical Exam Vitals and nursing note reviewed.  Constitutional:      General: She is not in acute distress.    Appearance: Normal appearance. She is well-developed.  HENT:     Head: Normocephalic and atraumatic.  Eyes:     Conjunctiva/sclera: Conjunctivae normal.  Cardiovascular:     Rate and Rhythm: Normal rate and regular rhythm.      Heart sounds: No murmur heard. Pulmonary:     Effort: Pulmonary effort is normal. No respiratory distress.     Breath sounds: Normal breath sounds.  Abdominal:     Palpations: Abdomen is soft.     Tenderness: There is no abdominal tenderness. There is no guarding or rebound.  Musculoskeletal:     Cervical back: Neck supple.     Right lower leg: No edema.     Left lower leg: No edema.  Skin:    General: Skin is warm and dry.     Capillary Refill: Capillary refill takes less than 2 seconds.  Neurological:     General: No focal deficit present.     Mental Status: She is alert.     ED Results / Procedures / Treatments   Labs (all labs ordered are listed, but only abnormal results are displayed) Labs Reviewed - No data to display  EKG None  Radiology No results found.  Procedures Procedures  {Document cardiac monitor, telemetry assessment procedure when appropriate:1}  Medications Ordered in ED Medications  sodium chloride 0.9 % bolus 1,000 mL (has no administration in time range)  ondansetron (ZOFRAN) injection 4 mg (has no administration in time range)  pantoprazole (PROTONIX) injection 40 mg (has no administration in time range)    ED Course/ Medical Decision Making/ A&P   {   Click here for ABCD2, HEART and other calculatorsREFRESH Note before signing :1}                              Medical Decision Making Amount and/or Complexity of Data Reviewed Labs: ordered. Radiology: ordered.  Risk Prescription drug management.   This patient complains of ***; this involves an extensive number of treatment Options and is a complaint that carries with it a high risk of complications and morbidity. The differential includes ***  I ordered, reviewed and interpreted labs, which included *** I ordered medication *** and reviewed PMP when indicated. I ordered imaging studies which included *** and I independently    visualized and interpreted imaging which showed  *** Additional history obtained from *** Previous records obtained and reviewed *** I consulted *** and discussed lab and imaging findings and discussed disposition.  Cardiac monitoring reviewed, *** Social determinants considered, *** Critical Interventions: ***  After the interventions stated above, I reevaluated the patient and found *** Admission and further testing considered, ***   {Document critical care time when appropriate:1} {Document review of labs and clinical decision tools ie heart score, Chads2Vasc2 etc:1}  {Document your independent review of radiology images, and any outside records:1} {Document your discussion with family members, caretakers, and with consultants:1} {Document social determinants of health affecting pt's care:1} {Document your decision making why or why  not admission, treatments were needed:1} Final Clinical Impression(s) / ED Diagnoses Final diagnoses:  None    Rx / DC Orders ED Discharge Orders     None

## 2023-03-09 ENCOUNTER — Emergency Department (HOSPITAL_COMMUNITY)

## 2023-03-09 ENCOUNTER — Encounter (HOSPITAL_COMMUNITY): Payer: Self-pay

## 2023-03-09 DIAGNOSIS — K8020 Calculus of gallbladder without cholecystitis without obstruction: Secondary | ICD-10-CM | POA: Diagnosis not present

## 2023-03-09 DIAGNOSIS — R1013 Epigastric pain: Secondary | ICD-10-CM | POA: Diagnosis not present

## 2023-03-09 DIAGNOSIS — D259 Leiomyoma of uterus, unspecified: Secondary | ICD-10-CM | POA: Diagnosis not present

## 2023-03-09 DIAGNOSIS — K828 Other specified diseases of gallbladder: Secondary | ICD-10-CM | POA: Diagnosis not present

## 2023-03-09 DIAGNOSIS — J90 Pleural effusion, not elsewhere classified: Secondary | ICD-10-CM | POA: Diagnosis not present

## 2023-03-09 DIAGNOSIS — N200 Calculus of kidney: Secondary | ICD-10-CM | POA: Diagnosis not present

## 2023-03-09 MED ORDER — ONDANSETRON HCL 4 MG/2ML IJ SOLN: 4.0000 mg | Freq: Once | INTRAMUSCULAR | Status: AC

## 2023-03-09 MED ORDER — ONDANSETRON 4 MG PO TBDP: 4.0000 mg | ORAL_TABLET | Freq: Three times a day (TID) | ORAL | 0 refills | Status: DC | PRN

## 2023-03-09 MED ORDER — ONDANSETRON HCL 4 MG/2ML IJ SOLN
INTRAMUSCULAR | Status: AC
  Administered 2023-03-09: 4 mg via INTRAVENOUS
  Filled 2023-03-09: qty 2

## 2023-03-09 NOTE — Telephone Encounter (Signed)
Give 6 month refill

## 2023-03-09 NOTE — ED Provider Notes (Signed)
Patient presents from [facility] for CT of the abdomen.  Patient presented to [facility] with abdominal pain, nausea, vomiting.  Per family, pain came on after eating dinner.  She has had prior similar symptoms in the past after eating.  CT scan is concerning for possible cholecystitis.  No leukocytosis and LFTs are reassuring.  Right upper quadrant ultrasound was obtained and does not show any evidence of gallbladder wall thickening or pericholecystic fluid.  It does show cholelithiasis.  Given history, feel this is consistent with recurrent symptoms after eating.  She would be high risk for surgery given her age and comorbidities.  We discussed supportive management home and surgery follow-up as an outpatient although I did indicate to the family that she may or may not be a candidate for elective removal.  They stated understanding.  Patient overall states she feels much better.  She is able to tolerate fluids.  Physical Exam  BP (!) 166/95   Pulse 95   Temp (!) 97.5 F (36.4 C)   Resp 16   SpO2 100%   Physical Exam Awake, alert, no acute distress Procedures  Procedures  ED Course / MDM   Clinical Course as of 03/09/23 0504  Tue Mar 08, 2023  2031 DG Chest [facility] 1 View [MB]  2237 Reviewed results so far with patient and family members.  They are in agreement that they would want her transferred to [facility] for imaging and further workup.  Reached out to Dr. Jeraldine Loots ED physician at [facility] who is excepting the patient in transfer for ED at [facility] for further workup including CT abdomen and pelvis.  Family understands that she may return any pain or may get admitted to [facility] depending on results of imaging and her course [MB]    Clinical Course User Index [MB] Terrilee Files, MD     Problem List Items Addressed This Visit   None Visit Diagnoses     Upper abdominal pain    -  Primary   Nausea vomiting and diarrhea       Chronic kidney disease, unspecified CKD stage        Calculus of gallbladder without cholecystitis without obstruction                   Shon Baton, MD 03/09/23 [id number]

## 2023-03-09 NOTE — ED Notes (Signed)
Patient arrived to ED room [facility] at this time as a transfer from [place].

## 2023-03-09 NOTE — Discharge Instructions (Signed)
You were seen today for abdominal pain and nausea related to eating.  Your CT and ultrasound imaging indicates likely gallstones.  Given your history, this correlates.  Avoid foods with high fat content or greasy foods.  Follow-up with general surgery.

## 2023-03-10 MED ORDER — TORSEMIDE 20 MG PO TABS: ORAL_TABLET | ORAL | 2 refills | Status: DC

## 2023-03-10 NOTE — Addendum Note (Signed)
Addended by: Elizbeth Squires on: 03/10/2023 02:21 PM   Modules accepted: Orders

## 2023-03-10 NOTE — Telephone Encounter (Signed)
Script sent to pharmacy.

## 2023-03-14 ENCOUNTER — Other Ambulatory Visit: Payer: Self-pay

## 2023-03-14 ENCOUNTER — Telehealth: Payer: Self-pay

## 2023-03-14 MED ORDER — TORSEMIDE 20 MG PO TABS: ORAL_TABLET | ORAL | 2 refills | Status: DC

## 2023-03-14 MED ORDER — HYDRALAZINE HCL 25 MG PO TABS: 25.0000 mg | ORAL_TABLET | Freq: Three times a day (TID) | ORAL | 1 refills | Status: DC

## 2023-03-14 NOTE — Telephone Encounter (Signed)
Refill for 6 months on each

## 2023-03-14 NOTE — Telephone Encounter (Signed)
Prescription Request  03/14/2023  LOV: Visit date not found  What is the name of the medication or equipment? torsemide (DEMADEX) 20 MG tablet , hydrALAZINE (APRESOLINE) 25 MG tablet   Have you contacted your pharmacy to request a refill? Yes   Which pharmacy would you like this sent to?  [facility] - [place], [place] - [address] [place] [address] Phone: [phone] Fax: [phone]    Patient notified that their request is being sent to the clinical staff for review and that they should receive a response within 2 business days.   Please advise at Mobile [phone] (mobile)

## 2023-03-15 ENCOUNTER — Ambulatory Visit (INDEPENDENT_AMBULATORY_CARE_PROVIDER_SITE_OTHER)

## 2023-03-15 DIAGNOSIS — I495 Sick sinus syndrome: Secondary | ICD-10-CM

## 2023-03-16 ENCOUNTER — Ambulatory Visit (INDEPENDENT_AMBULATORY_CARE_PROVIDER_SITE_OTHER): Admitting: Nurse Practitioner

## 2023-03-16 VITALS — BP 159/73 | HR 90 | Temp 97.5°F

## 2023-03-16 DIAGNOSIS — K219 Gastro-esophageal reflux disease without esophagitis: Secondary | ICD-10-CM | POA: Diagnosis not present

## 2023-03-16 DIAGNOSIS — K8020 Calculus of gallbladder without cholecystitis without obstruction: Secondary | ICD-10-CM | POA: Diagnosis not present

## 2023-03-16 LAB — CUP PACEART REMOTE DEVICE CHECK
Battery Remaining Longevity: 103 mo
Battery Remaining Percentage: 80 %
Battery Voltage: 3.02 V
Brady Statistic RV Percent Paced: 24 %
Date Time Interrogation Session: 20241008103246
Implantable Lead Connection Status: 753985
Implantable Lead Connection Status: 753985
Implantable Lead Implant Date: 20100602
Implantable Lead Implant Date: 20100602
Implantable Lead Location: 753859
Implantable Lead Location: 753860
Implantable Pulse Generator Implant Date: 20211011
Lead Channel Impedance Value: 390 Ohm
Lead Channel Pacing Threshold Amplitude: 1 V
Lead Channel Pacing Threshold Pulse Width: 0.4 ms
Lead Channel Sensing Intrinsic Amplitude: 11.7 mV
Lead Channel Setting Pacing Amplitude: 2.5 V
Lead Channel Setting Pacing Pulse Width: 0.4 ms
Lead Channel Setting Sensing Sensitivity: 2 mV
Pulse Gen Model: 2272
Pulse Gen Serial Number: 6220844

## 2023-03-16 MED ORDER — FAMOTIDINE 20 MG PO TABS
20.0000 mg | ORAL_TABLET | Freq: Two times a day (BID) | ORAL | 0 refills | Status: DC
Start: 2023-03-16 — End: 2023-05-16

## 2023-03-16 NOTE — Patient Instructions (Signed)
Gallbladder Eating Plan High blood cholesterol, obesity, a sedentary lifestyle, an unhealthy diet, and diabetes are risk factors for developing gallstones. If you have a gallbladder condition, you may have trouble digesting fats and tolerating high fat intake. Eating a low-fat diet can help reduce your symptoms and may be helpful before and after having surgery to remove your gallbladder (cholecystectomy). Your health care provider may recommend that you work with a dietitian to help you reduce the amount of fat in your diet. What are tips for following this plan? General guidelines Limit your fat intake to less than 30% of your total daily calories. If you eat around 1,800 calories each day, this means eating less than 60 grams (g) of fat per day. Fat is an important part of a healthy diet. Eating a low-fat diet can make it hard to maintain a healthy body weight. Ask your dietitian how much fat, calories, and other nutrients you need each day. Eat small, frequent meals throughout the day instead of three large meals. Drink at least 8-10 cups (1.9-2.4 L) of fluid a day. Drink enough fluid to keep your urine pale yellow. If you drink alcohol: Limit how much you have to: 0-1 drink a day for women who are not pregnant. 0-2 drinks a day for men. Know how much alcohol is in a drink. In the [place] one drink equals one 12 oz bottle of beer (355 mL), one 5 oz glass of wine (148 mL), or one 1 oz glass of hard liquor (44 mL). Reading food labels  Check nutrition facts on food labels for the amount of fat per serving. Choose foods with less than 3 grams of fat per serving. Shopping Choose nonfat and low-fat healthy foods. Look for the words "nonfat," "low-fat," or "fat-free." Avoid buying processed or prepackaged foods. Cooking Cook using low-fat methods, such as baking, broiling, grilling, or boiling. Cook with small amounts of healthy fats, such as olive oil, grapeseed oil, canola oil, avocado oil, or  sunflower oil. What foods are recommended?  All fresh, frozen, or canned fruits and vegetables. Whole grains. Low-fat or nonfat (skim) milk and yogurt. Lean meat, skinless poultry, fish, eggs, and beans. Low-fat protein supplement powders or drinks. Spices and herbs. The items listed above may not be a complete list of foods and beverages you can eat and drink. Contact a dietitian for more information. What foods are not recommended? High-fat foods. These include baked goods, fast food, fatty cuts of meat, ice cream, french toast, sweet rolls, pizza, cheese bread, foods covered with butter, creamy sauces, or cheese. Fried foods. These include french fries, tempura, battered fish, breaded chicken, fried breads, and sweets. Foods that cause bloating and gas. The items listed above may not be a complete list of foods that you should avoid. Contact a dietitian for more information. Summary A low-fat diet can be helpful if you have a gallbladder condition, or before and after gallbladder surgery. Limit your fat intake to less than 30% of your total daily calories. This is about 60 g of fat if you eat 1,800 calories each day. Eat small, frequent meals throughout the day instead of three large meals. This information is not intended to replace advice given to you by your health care provider. Make sure you discuss any questions you have with your health care provider. Document Revised: 05/08/2021 Document Reviewed: 05/08/2021 Elsevier Patient Education  2024 [employer].

## 2023-03-17 ENCOUNTER — Encounter: Payer: Self-pay | Admitting: Nurse Practitioner

## 2023-03-17 NOTE — Progress Notes (Signed)
Subjective:    Patient ID: [name], female    DOB: 18-Oct-1926, 89 y.o.   MRN: [id number]  HPI Presents for recheck after ED visit on 10/1 for abdominal pain.  Her daughter and husband are present today per her request.  Her final diagnosis was calculus of the gallbladder without cholecystitis or obstruction.  Surgical consultation has been set for 10/14 although patient is already stated that she does not want surgery.  Also many risk factors related to her age.  She has changed her diet and is careful about what she eats to avoid another flareup.  Thinks it was related to some foods that she ate.  Has occasional mild reflux.  Has had off-and-on flareups over the years, none recently.  Some nausea, no vomiting.  Appetite good.  Taking fluids well.  Voiding normal limit.   Review of Systems  Constitutional:  Negative for fever.  Respiratory:  Negative for cough, chest tightness and shortness of breath.   Cardiovascular:  Negative for chest pain.  Gastrointestinal:  Positive for nausea. Negative for abdominal pain, constipation, diarrhea and vomiting.       Objective:   Physical Exam NAD.  Alert, oriented.  Lungs clear.  Heart regular rate rhythm.  Abdomen soft nondistended with very mild epigastric area discomfort on exam.  No right upper quadrant tenderness noted.  No rebound or guarding.  No obvious masses. Today's Vitals   03/16/23 1449  BP: (!) 159/73  Pulse: 90  Temp: (!) 97.5 F (36.4 C)  SpO2: 100%   There is no height or weight on file to calculate BMI.        Assessment & Plan:   Problem List Items Addressed This Visit       Digestive   Calculus of gallbladder without cholecystitis without obstruction - Primary   Other Visit Diagnoses     Gastroesophageal reflux disease without esophagitis       Relevant Medications   famotidine (PEPCID) 20 MG tablet      Meds ordered this encounter  Medications   famotidine (PEPCID) 20 MG tablet    Sig: Take 1  tablet (20 mg total) by mouth 2 (two) times daily. Prn acid reflux    Dispense:  60 tablet    Refill:  0    Please deliver. Thanks.    Order Specific Question:   Supervising Provider    Answer:   Babs Sciara [[id number]]   Follow-up with surgeon as planned.  Given written and verbal information on dietary measures to help avoid gallbladder flareup as well as GERD symptoms. Seek help immediately if symptoms recur.  Otherwise routine follow-up here. Defers flu vaccine today.

## 2023-03-21 DIAGNOSIS — K8020 Calculus of gallbladder without cholecystitis without obstruction: Secondary | ICD-10-CM | POA: Diagnosis not present

## 2023-03-21 DIAGNOSIS — I509 Heart failure, unspecified: Secondary | ICD-10-CM | POA: Diagnosis not present

## 2023-03-31 NOTE — Progress Notes (Signed)
Remote pacemaker transmission.   

## 2023-04-11 ENCOUNTER — Telehealth: Payer: Self-pay

## 2023-04-11 NOTE — Telephone Encounter (Signed)
Prescription Request  04/11/2023  LOV: Visit date not found  What is the name of the medication or equipment? hydrALAZINE (APRESOLINE) 25 MG tablet   Have you contacted your pharmacy to request a refill? Yes   Which pharmacy would you like this sent to?  [facility] - [place], [place] - [address] [place] [address] Phone: [phone] Fax: [phone]    Patient notified that their request is being sent to the clinical staff for review and that they should receive a response within 2 business days.   Please advise at Mobile [phone] (mobile)

## 2023-04-12 MED ORDER — HYDRALAZINE HCL 25 MG PO TABS: 25.0000 mg | ORAL_TABLET | Freq: Three times a day (TID) | ORAL | 1 refills | Status: DC

## 2023-04-12 NOTE — Telephone Encounter (Signed)
May have 6 months refill 

## 2023-04-12 NOTE — Addendum Note (Signed)
Addended by: Elizbeth Squires on: 04/12/2023 01:55 PM   Modules accepted: Orders

## 2023-04-12 NOTE — Telephone Encounter (Signed)
Prescription sent to pharmacy per Dr Lorin Picket.

## 2023-05-16 ENCOUNTER — Other Ambulatory Visit: Payer: Self-pay | Admitting: *Deleted

## 2023-05-16 MED ORDER — FAMOTIDINE 20 MG PO TABS: 20.0000 mg | ORAL_TABLET | Freq: Two times a day (BID) | ORAL | 0 refills | Status: DC

## 2023-06-06 ENCOUNTER — Telehealth: Payer: Self-pay | Admitting: Family Medicine

## 2023-06-06 MED ORDER — TORSEMIDE 20 MG PO TABS: ORAL_TABLET | ORAL | 2 refills | Status: DC

## 2023-06-06 NOTE — Telephone Encounter (Signed)
Received via fax Rx request: Prescription sent electronically to pharmacy  

## 2023-06-06 NOTE — Telephone Encounter (Signed)
Refill on torsemide 20 mg send to [facility]

## 2023-06-14 ENCOUNTER — Other Ambulatory Visit: Payer: Self-pay | Admitting: Family Medicine

## 2023-06-25 ENCOUNTER — Other Ambulatory Visit: Payer: Self-pay | Admitting: Family Medicine

## 2023-07-11 ENCOUNTER — Other Ambulatory Visit: Payer: Self-pay | Admitting: Family Medicine

## 2023-07-11 MED ORDER — FAMOTIDINE 20 MG PO TABS: 20.0000 mg | ORAL_TABLET | Freq: Every day | ORAL | 0 refills | Status: DC

## 2023-07-11 NOTE — Telephone Encounter (Signed)
Copied from CRM [id number]. Topic: Clinical - Medication Refill >> Jul 11, 2023  9:21 AM Prudencio Pair wrote: Most Recent Primary Care Visit:  Provider: Campbell Riches  Department: [facility]  Visit Type: HOSPITAL FU  Date: 03/16/2023  Medication: famotidine (PEPCID) 20 MG tablet & hydrALAZINE (APRESOLINE) 25 MG tablet  Has the patient contacted their pharmacy? No (Agent: If no, request that the patient contact the pharmacy for the refill. If patient does not wish to contact the pharmacy document the reason why and proceed with request.) (Agent: If yes, when and what did the pharmacy advise?)  Is this the correct pharmacy for this prescription? Yes If no, delete pharmacy and type the correct one.  This is the patient's preferred pharmacy:  [facility] - [place], [place] - [address] [place] [address] Phone: [phone] Fax: [phone]   Has the prescription been filled recently? Yes  Is the patient out of the medication? Yes  Has the patient been seen for an appointment in the last year OR does the patient have an upcoming appointment? Yes  Can we respond through MyChart? No  Agent: Please be advised that Rx refills may take up to 3 business days. We ask that you follow-up with your pharmacy.

## 2023-08-03 ENCOUNTER — Ambulatory Visit: Payer: Self-pay | Admitting: Family Medicine

## 2023-08-03 NOTE — Telephone Encounter (Signed)
 I agree with this approach.  If any ongoing troubles or problems to let us know thank you

## 2023-08-03 NOTE — Telephone Encounter (Signed)
  Chief Complaint: medication question Symptoms: Patient vomited morning dose of medications yesterday- she was able to keep her night dose down.  Frequency: once Pertinent Negatives: Patient denies stomach pain, continued nausea/vomiting Disposition: [] ED /[] Urgent Care (no appt availability in office) / [] Appointment(In office/virtual)/ []  [facility]/ [x] Home Care/ [] Refused Recommended Disposition /[] [place] [facility]/ []  Follow-up with PCP Additional Notes: Patient advised to try to continue medication- if she vomits again please let provider know.    Copied from CRM [id number]. Topic: Clinical - Red Word Triage >> Aug 03, 2023 11:02 AM Eunice Blase wrote: Red Word that prompted transfer to Nurse Triage: Pt seems disoriented does not know what medication is called or what it is for. She is vomiting. Reason for Disposition  Caller has medicine question, adult has minor symptoms, caller declines triage, AND triager answers question  Answer Assessment - Initial Assessment Questions 1. NAME of MEDICINE: "What medicine(s) are you calling about?"     Patient states she took morning medication yesterday and she vomited. Patient skipped her afternoon dose and took nighttime dose. Patient was able to keep medication down. Patient reports she was afraid to take her medication today. 2. QUESTION: "What is your question?" (e.g., double dose of medicine, side effect)     Patient has not taken her morning dose of medication- patient advised if she has been taking the same medication with no changes- the vomiting may just be episodic- she has no stomach pain or reoccurring vomiting yesterday. Encouraged patient to continue her medication- please call back if she has vomiting again.  3. PRESCRIBER: "Who prescribed the medicine?" Reason: if prescribed by specialist, call should be referred to that group.     PCP 4. SYMPTOMS: "Do you have any symptoms?" If Yes, ask: "What symptoms are you having?"   "How bad are the symptoms (e.g., mild, moderate, severe)     No stomach pain today or nausea. Patient encouraged to take her daily medications- call back if she vomits again. Patient states she will.  Protocols used: Medication Question Call-A-AH

## 2023-08-03 NOTE — Telephone Encounter (Signed)
 Patient was triaged- see other message

## 2023-08-03 NOTE — Telephone Encounter (Signed)
 Final attempt #3. This RN attempted to contact patient, call forwarded to VMB. LVM for callback. Will forward to practice

## 2023-08-03 NOTE — Telephone Encounter (Signed)
 Attempt #1. This RN attempted to contact patient, call forwarded to VMB. LVM for callback. Will continue to attempt. Copied from CRM [id number]. Topic: Clinical - Medication Question >> Aug 03, 2023  9:10 AM [name] wrote: Reason for CRM: The patient has called their daughter this morning and shared that she feels unwell and believes it is related to her medication. The patient shares with their family that the began to experience nausea yesterday 08/02/23   The patient would like to be contacted by a member of clinical staff to discuss their concerns further   Please contact the patient further when possible

## 2023-08-03 NOTE — Telephone Encounter (Signed)
 Attempt #2. This RN attempted to contact patient, call forwarded to VMB. LVM for callback. Will continue to attempt.

## 2023-08-30 ENCOUNTER — Encounter: Payer: Self-pay | Admitting: Family Medicine

## 2023-08-30 ENCOUNTER — Ambulatory Visit (INDEPENDENT_AMBULATORY_CARE_PROVIDER_SITE_OTHER): Admitting: Family Medicine

## 2023-08-30 VITALS — BP 150/66 | HR 80 | Temp 98.1°F | Ht 62.0 in

## 2023-08-30 DIAGNOSIS — R1013 Epigastric pain: Secondary | ICD-10-CM | POA: Diagnosis not present

## 2023-08-30 DIAGNOSIS — I4819 Other persistent atrial fibrillation: Secondary | ICD-10-CM

## 2023-08-30 DIAGNOSIS — K8020 Calculus of gallbladder without cholecystitis without obstruction: Secondary | ICD-10-CM | POA: Diagnosis not present

## 2023-08-30 DIAGNOSIS — K5909 Other constipation: Secondary | ICD-10-CM | POA: Diagnosis not present

## 2023-08-30 DIAGNOSIS — Z515 Encounter for palliative care: Secondary | ICD-10-CM | POA: Diagnosis not present

## 2023-08-30 DIAGNOSIS — N184 Chronic kidney disease, stage 4 (severe): Secondary | ICD-10-CM

## 2023-08-30 DIAGNOSIS — I10 Essential (primary) hypertension: Secondary | ICD-10-CM | POA: Diagnosis not present

## 2023-08-30 MED ORDER — HYDRALAZINE HCL 25 MG PO TABS: ORAL_TABLET | ORAL | 1 refills | Status: DC

## 2023-08-30 MED ORDER — URSODIOL 300 MG PO CAPS: 300.0000 mg | ORAL_CAPSULE | Freq: Two times a day (BID) | ORAL | 4 refills | Status: DC

## 2023-08-30 MED ORDER — TORSEMIDE 20 MG PO TABS: ORAL_TABLET | ORAL | 1 refills | Status: DC

## 2023-08-30 MED ORDER — FAMOTIDINE 20 MG PO TABS: 20.0000 mg | ORAL_TABLET | Freq: Every day | ORAL | 4 refills | Status: DC

## 2023-08-30 MED ORDER — DILTIAZEM HCL ER COATED BEADS 180 MG PO CP24: 180.0000 mg | ORAL_CAPSULE | Freq: Every day | ORAL | 5 refills | Status: DC

## 2023-08-30 NOTE — Telephone Encounter (Signed)
 Nurses Unfortunately this was not brought to my attention before I saw the patient-also her son was with them and he did not mention this either Please notify family and let them know regarding this (I typically do not get a chance to look at phone messages until lunch) Please inform family that patient has atrial fibs which is chronic for her.  Her heart rate was in the 90s.  Her lungs were clear.  There was no swelling in the legs.  I do not see where she is taking diltiazem.  I restarted it at a low dose to help keep the heart rate from running fast and to help her blood pressure I recommended reducing the fluid pill down to 1 in the morning Stool softeners and stool senna do not have to be taken every day unless necessary to keep bowel movements soft Lab work was ordered [facility] within 3 months If they have additional questions let me know-thanks-Dr. Lorin Picket

## 2023-08-30 NOTE — Telephone Encounter (Signed)
 Pt has an appt today and is requesting a call to be filled in with today's information   Copied from CRM [id number]. Topic: General - Other >> Aug 30, 2023 10:10 AM Gery Pray wrote: Reason for CRM: Daughter ([name]) stated to have Dr. Gerda Diss to give her or her sister Jamesetta So a call once the patient is in the room with him so that she may know what is going on with her mother's health. [name] can be contacted at [phone] or Juanna Cao. [phone]

## 2023-08-30 NOTE — Progress Notes (Signed)
 Subjective:    Patient ID: [name], female    DOB: 08-28-1926, 89 y.o.   MRN: [id number]  HPIupset stomach after taking pills in morning x's 1 week Discussed the use of AI scribe software for clinical note transcription with the patient, who gave verbal consent to proceed.  History of Present Illness   [name] is a 89 year old female who presents with episodes of vomiting and medication management issues. She is accompanied by her daughter.  Over the past three weeks, she has experienced intermittent episodes of vomiting, which are distressing and occur sporadically. The vomiting is associated with medication intake, as she has been observed to vomit shortly after taking her medications. One notable incident occurred at a food parlor where she experienced gagging and vomiting but eventually settled down. She usually eats a little before taking her medications to avoid an empty stomach, which she believes might contribute to her symptoms.  There is concern regarding her medication management. She is on multiple medications, including a heart pill, a fluid pill, a blood pressure medication, and a stool softener, which she takes as needed. Her daughter is unsure about the purpose of some medications and whether they are necessary. She has been taking a fluid pill both in the morning and evening, which may contribute to nocturia. Additionally, she was previously on diltiazem, which she has not been taking recently.  Her appetite is generally good if she can get the food she desires, and she feels well until she takes her medications. Her bowel movements are currently regular, and she uses a stool softener as needed.        Review of Systems     Objective:   Physical Exam  General-in no acute distress Eyes-no discharge Lungs-respiratory rate normal, CTA CV-no murmurs,RRR Extremities skin warm dry no edema Neuro grossly normal Behavior normal, alert        Assessment & Plan:  Assessment and Plan    Nausea and Vomiting Likely medication-related due to polypharmacy and potential mismanagement. Symptoms correlate with medication intake. - Discontinue unlabeled medications. - Review and organize current medications. - Order blood work at [facility].  Atrial Fibrillation Requires rate control. Diltiazem not currently taken, restarting is crucial to prevent tachycardia. - Restart diltiazem at a low dose. - [facility]  Hypertension Blood pressure slightly elevated at 158/70 mmHg. Current regimen includes antihypertensive medication, but confusion exists regarding specific medications. Twice-daily torsemide may contribute to nocturia and affect control. - Check blood pressure regularly. - Review and adjust antihypertensive medications as needed. - Reduce torsemide to once daily in the morning. - Discontinue evening dose of torsemide.  Fluid Retention Currently on torsemide twice daily, which may be excessive and contribute to nocturia. - Reduce torsemide to once daily in the morning. - Discontinue evening dose of torsemide.  Constipation Stool softeners used as needed. Current twice-daily regimen may be unnecessary if bowel movements are regular. - Use stool softeners only as needed.     1. Epigastric abdominal pain (Primary) We will check lab work.  Patient not having any symptoms today. - CBC with Differential - Basic Metabolic Panel - Hepatic Function Panel - Lipase  2. Chronic kidney disease, stage IV (severe) (HCC) I reviewed over her lab work from previous as well as reviewed over her medicines She does not need as much diuretic is what she has been doing We will reduce this down to 1 tablet daily Check lab work -  CBC with Differential - Basic Metabolic Panel - Hepatic Function Panel - Lipase  3. Calculus of gallbladder without cholecystitis without obstruction She is on the medication to  try to help dissolve gallstone not having any symptoms with it currently  4. Primary hypertension Blood pressure acceptable for her age  40. Hospice care patient She has done remarkable lately actually doing much better than expected  6. Persistent atrial fibrillation (HCC) She has increased risk of bleeding I would not recommend anticoagulation we will reinitiate diltiazem in 180 mg to try to keep heart rate from running fast  7. Other constipation She is on a stool softener but I recommend this as needed only  Follow-up in about 3 to 4 months

## 2023-08-31 LAB — BASIC METABOLIC PANEL
BUN/Creatinine Ratio: 17 (ref 12–28)
BUN: 55 mg/dL — ABNORMAL HIGH (ref 10–36)
CO2: 23 mmol/L (ref 20–29)
Calcium: 10.1 mg/dL (ref 8.7–10.3)
Chloride: 104 mmol/L (ref 96–106)
Creatinine, Ser: 3.25 mg/dL — ABNORMAL HIGH (ref 0.57–1.00)
Glucose: 96 mg/dL (ref 70–99)
Potassium: 4.7 mmol/L (ref 3.5–5.2)
Sodium: 144 mmol/L (ref 134–144)
eGFR: 13 mL/min/{1.73_m2} — ABNORMAL LOW (ref >59)

## 2023-08-31 LAB — HEPATIC FUNCTION PANEL
ALT: 9 IU/L (ref 0–32)
AST: 17 IU/L (ref 0–40)
Albumin: 4.5 g/dL (ref 3.6–4.6)
Alkaline Phosphatase: 103 IU/L (ref 44–121)
Bilirubin Total: 0.4 mg/dL (ref 0.0–1.2)
Bilirubin, Direct: 0.22 mg/dL (ref 0.00–0.40)
Total Protein: 6.8 g/dL (ref 6.0–8.5)

## 2023-08-31 LAB — CBC WITH DIFFERENTIAL/PLATELET
Basophils Absolute: 0 10*3/uL (ref 0.0–0.2)
Basos: 0 %
EOS (ABSOLUTE): 0.1 10*3/uL (ref 0.0–0.4)
Eos: 2 %
Hematocrit: 31.7 % — ABNORMAL LOW (ref 34.0–46.6)
Hemoglobin: 9.9 g/dL — ABNORMAL LOW (ref 11.1–15.9)
Immature Grans (Abs): 0 10*3/uL (ref 0.0–0.1)
Immature Granulocytes: 0 %
Lymphocytes Absolute: 0.9 10*3/uL (ref 0.7–3.1)
Lymphs: 18 %
MCH: 29.1 pg (ref 26.6–33.0)
MCHC: 31.2 g/dL — ABNORMAL LOW (ref 31.5–35.7)
MCV: 93 fL (ref 79–97)
Monocytes Absolute: 0.5 10*3/uL (ref 0.1–0.9)
Monocytes: 10 %
Neutrophils Absolute: 3.4 10*3/uL (ref 1.4–7.0)
Neutrophils: 70 %
Platelets: 169 10*3/uL (ref 150–450)
RBC: 3.4 x10E6/uL — ABNORMAL LOW (ref 3.77–5.28)
RDW: 12.1 % (ref 11.7–15.4)
WBC: 4.9 10*3/uL (ref 3.4–10.8)

## 2023-08-31 LAB — LIPASE: Lipase: 55 U/L (ref 14–85)

## 2023-09-01 NOTE — Telephone Encounter (Signed)
Left message for patient to return the call for additional details and recommendations.   

## 2023-09-02 ENCOUNTER — Encounter: Payer: Self-pay | Admitting: Family Medicine

## 2023-09-02 NOTE — Telephone Encounter (Signed)
 Pt's daughter has been notified.

## 2023-09-02 NOTE — Progress Notes (Signed)
 Pt daughter Carley Hammed has been made aware

## 2023-09-05 ENCOUNTER — Other Ambulatory Visit: Payer: Self-pay

## 2023-09-05 ENCOUNTER — Telehealth: Payer: Self-pay

## 2023-09-05 NOTE — Telephone Encounter (Unsigned)
 Copied from CRM [id number]. Topic: Clinical - Medical Advice >> Sep 05, 2023  9:25 AM Gildardo Pounds wrote: Reason for CRM: Clydie Braun with Hospice says patient was seen last week, and torsemide (DEMADEX) 20 MG tablet was reduced from 3 pills a day to 1 a day. Callback number is [phone]

## 2023-09-05 NOTE — Telephone Encounter (Signed)
 [name] has been informed for pt to take 2 pills every morning. Sent in refill with appropriate amount. Per provider torsemide not topamax

## 2023-09-05 NOTE — Telephone Encounter (Signed)
 I would recommend increasing it to 2 tablets each day then give [place] an update within 7 to 10 days  New prescription Topamax 2 pills every morning, #60, 5 refills

## 2023-09-06 ENCOUNTER — Ambulatory Visit: Payer: Self-pay | Admitting: *Deleted

## 2023-09-06 NOTE — Telephone Encounter (Signed)
 First attempt to return call to [name] at [phone].  [name] is the name mentioned on the answering machine.  Will route to the call back folder for future attempts.

## 2023-09-06 NOTE — Telephone Encounter (Signed)
    Chief Complaint: Constipation x 1 day Symptoms: BM yesterday Frequency: every other day Pertinent Negatives: Patient denies abdominal pain Disposition: [] ED /[] Urgent Care (no appt availability in office) / [] Appointment(In office/virtual)/ []  [facility]/ [x] Home Care/ [] Refused Recommended Disposition /[] [facility]/ []  Follow-up with PCP Additional Notes: Daughter states she just took her Senna Plus "and we'll see how she does."  Reason for Disposition  MILD constipation  Answer Assessment - Initial Assessment Questions 1. STOOL PATTERN OR FREQUENCY: "How often do you have a bowel movement (BM)?"  (Normal range: 3 times a day to every 3 days)  "When was your last BM?"       Yesterday 2. STRAINING: "Do you have to strain to have a BM?"      No 3. RECTAL PAIN: "Does your rectum hurt when the stool comes out?" If Yes, ask: "Do you have hemorrhoids? How bad is the pain?"  (Scale 1-10; or mild, moderate, severe)     No 4. STOOL COMPOSITION: "Are the stools hard?"      Unsure 5. BLOOD ON STOOLS: "Has there been any blood on the toilet tissue or on the surface of the BM?" If Yes, ask: "When was the last time?"     No 6. CHRONIC CONSTIPATION: "Is this a new problem for you?"  If No, ask: "How long have you had this problem?" (days, weeks, months)      Yes 7. CHANGES IN DIET OR HYDRATION: "Have there been any recent changes in your diet?" "How much fluids are you drinking on a daily basis?"  "How much have you had to drink today?"     No 8. MEDICINES: "Have you been taking any new medicines?" "Are you taking any narcotic pain medicines?" (e.g., Dilaudid, morphine, Percocet, Vicodin)     No 9. LAXATIVES: "Have you been using any stool softeners, laxatives, or enemas?"  If Yes, ask "What, how often, and when was the last time?"     Senna 10. ACTIVITY:  "How much walking do you do every day?"  "Has your activity level decreased in the past week?"        Little 11.  CAUSE: "What do you think is causing the constipation?"        Unsure 12. OTHER SYMPTOMS: "Do you have any other symptoms?" (e.g., abdomen pain, bloating, fever, vomiting)       No 13. MEDICAL HISTORY: "Do you have a history of hemorrhoids, rectal fissures, or rectal surgery or rectal abscess?"         No 14. PREGNANCY: "Is there any chance you are pregnant?" "When was your last menstrual period?"       No  Protocols used: Constipation-A-AH

## 2023-09-06 NOTE — Telephone Encounter (Signed)
 Copied from CRM [id number]. Topic: Clinical - Medication Question >> Sep 06, 2023  8:14 AM Fonda Kinder J wrote: Reason for CRM: The pt is having trouble using the restroom and the pts daughter wants to know if she should take SENNA-PLUS 8.6-50 MG tablet? Please advise Callback # [phone]

## 2023-09-07 ENCOUNTER — Other Ambulatory Visit: Payer: Self-pay

## 2023-09-07 ENCOUNTER — Telehealth: Payer: Self-pay | Admitting: *Deleted

## 2023-09-07 MED ORDER — TORSEMIDE 20 MG PO TABS: ORAL_TABLET | ORAL | 5 refills | Status: DC

## 2023-09-07 NOTE — Telephone Encounter (Signed)
 Copied from CRM [id number]. Topic: Clinical - Medication Question >> Sep 07, 2023  2:12 PM Higinio Roger wrote: Reason for CRM: Clydie Braun (hospice nurse) would like to know if Dr. Lilyan Punt would like to increase torsemide (DEMADEX) 20 MG tablet from 2 to 3 for increased swelling.   Callback #: [phone]. Okay to leave voicemail

## 2023-09-07 NOTE — Telephone Encounter (Signed)
 Copied from CRM [id number]. Topic: Clinical - Medication Question >> Sep 07, 2023  8:47 AM [name] wrote: Reason for CRM: Pts hospice nurse called to state after changes to her meds per dr patel, she has swelling in abdomen feet and ankles, she did take 2 fluid pull. She is feeling okay and her vitals are all good. Just trying to figure out what dr patel wants to do about the swelling. Please let them know if she needs to change any meds and if the dr wants to see her. If dr patel wants to see her please call home number on file to schedule.  Callback $ [phone] [name] (nurse).

## 2023-09-07 NOTE — Telephone Encounter (Signed)
 Clydie Braun at [facility] was notified and verbalized understanding.

## 2023-09-07 NOTE — Telephone Encounter (Signed)
 Nurses Please increase torsemide-Demadex-20 mg New instructions 3 p.o. every morning, #90, 5 refills Also I am more than willing to see her if they want her to be seen Thursday or Friday late morning as a work it would be fine If they would rather increase the dose of the diuretic and see how things go over the course of the next 5 to 7 days that is understandable

## 2023-09-08 ENCOUNTER — Other Ambulatory Visit: Payer: Self-pay

## 2023-09-08 ENCOUNTER — Emergency Department (HOSPITAL_COMMUNITY)

## 2023-09-08 ENCOUNTER — Inpatient Hospital Stay (HOSPITAL_COMMUNITY)
Admission: EM | Admit: 2023-09-08 | Discharge: 2023-09-11 | DRG: 291 | Disposition: A | Discharge status: Hospice | Attending: Family Medicine | Admitting: Family Medicine

## 2023-09-08 ENCOUNTER — Encounter (HOSPITAL_COMMUNITY): Payer: Self-pay

## 2023-09-08 DIAGNOSIS — I5043 Acute on chronic combined systolic (congestive) and diastolic (congestive) heart failure: Secondary | ICD-10-CM | POA: Diagnosis present

## 2023-09-08 DIAGNOSIS — Z885 Allergy status to narcotic agent status: Secondary | ICD-10-CM

## 2023-09-08 DIAGNOSIS — N184 Chronic kidney disease, stage 4 (severe): Secondary | ICD-10-CM | POA: Diagnosis present

## 2023-09-08 DIAGNOSIS — Z515 Encounter for palliative care: Secondary | ICD-10-CM

## 2023-09-08 DIAGNOSIS — Z96651 Presence of right artificial knee joint: Secondary | ICD-10-CM | POA: Diagnosis present

## 2023-09-08 DIAGNOSIS — Z79899 Other long term (current) drug therapy: Secondary | ICD-10-CM

## 2023-09-08 DIAGNOSIS — R601 Generalized edema: Principal | ICD-10-CM

## 2023-09-08 DIAGNOSIS — K828 Other specified diseases of gallbladder: Secondary | ICD-10-CM | POA: Diagnosis not present

## 2023-09-08 DIAGNOSIS — I509 Heart failure, unspecified: Secondary | ICD-10-CM

## 2023-09-08 DIAGNOSIS — I4819 Other persistent atrial fibrillation: Secondary | ICD-10-CM | POA: Diagnosis present

## 2023-09-08 DIAGNOSIS — I5033 Acute on chronic diastolic (congestive) heart failure: Secondary | ICD-10-CM | POA: Diagnosis present

## 2023-09-08 DIAGNOSIS — R918 Other nonspecific abnormal finding of lung field: Secondary | ICD-10-CM | POA: Diagnosis not present

## 2023-09-08 DIAGNOSIS — N179 Acute kidney failure, unspecified: Secondary | ICD-10-CM | POA: Diagnosis present

## 2023-09-08 DIAGNOSIS — I10 Essential (primary) hypertension: Secondary | ICD-10-CM | POA: Diagnosis not present

## 2023-09-08 DIAGNOSIS — N185 Chronic kidney disease, stage 5: Secondary | ICD-10-CM | POA: Diagnosis present

## 2023-09-08 DIAGNOSIS — E1122 Type 2 diabetes mellitus with diabetic chronic kidney disease: Secondary | ICD-10-CM | POA: Diagnosis present

## 2023-09-08 DIAGNOSIS — I4821 Permanent atrial fibrillation: Secondary | ICD-10-CM | POA: Diagnosis present

## 2023-09-08 DIAGNOSIS — Z8673 Personal history of transient ischemic attack (TIA), and cerebral infarction without residual deficits: Secondary | ICD-10-CM

## 2023-09-08 DIAGNOSIS — K5900 Constipation, unspecified: Secondary | ICD-10-CM | POA: Diagnosis present

## 2023-09-08 DIAGNOSIS — E875 Hyperkalemia: Secondary | ICD-10-CM | POA: Diagnosis not present

## 2023-09-08 DIAGNOSIS — Z950 Presence of cardiac pacemaker: Secondary | ICD-10-CM

## 2023-09-08 DIAGNOSIS — E118 Type 2 diabetes mellitus with unspecified complications: Secondary | ICD-10-CM | POA: Diagnosis present

## 2023-09-08 DIAGNOSIS — I132 Hypertensive heart and chronic kidney disease with heart failure and with stage 5 chronic kidney disease, or end stage renal disease: Principal | ICD-10-CM | POA: Diagnosis present

## 2023-09-08 DIAGNOSIS — R0600 Dyspnea, unspecified: Secondary | ICD-10-CM | POA: Diagnosis not present

## 2023-09-08 DIAGNOSIS — J90 Pleural effusion, not elsewhere classified: Secondary | ICD-10-CM | POA: Diagnosis not present

## 2023-09-08 DIAGNOSIS — Z87442 Personal history of urinary calculi: Secondary | ICD-10-CM

## 2023-09-08 DIAGNOSIS — G40909 Epilepsy, unspecified, not intractable, without status epilepticus: Secondary | ICD-10-CM | POA: Diagnosis present

## 2023-09-08 DIAGNOSIS — E872 Acidosis, unspecified: Secondary | ICD-10-CM | POA: Diagnosis present

## 2023-09-08 DIAGNOSIS — R1084 Generalized abdominal pain: Secondary | ICD-10-CM | POA: Diagnosis not present

## 2023-09-08 DIAGNOSIS — J9611 Chronic respiratory failure with hypoxia: Secondary | ICD-10-CM | POA: Diagnosis present

## 2023-09-08 DIAGNOSIS — M79605 Pain in left leg: Secondary | ICD-10-CM | POA: Diagnosis not present

## 2023-09-08 DIAGNOSIS — J4489 Other specified chronic obstructive pulmonary disease: Secondary | ICD-10-CM | POA: Diagnosis present

## 2023-09-08 DIAGNOSIS — D259 Leiomyoma of uterus, unspecified: Secondary | ICD-10-CM | POA: Diagnosis not present

## 2023-09-08 DIAGNOSIS — I5020 Unspecified systolic (congestive) heart failure: Secondary | ICD-10-CM | POA: Diagnosis present

## 2023-09-08 DIAGNOSIS — Z66 Do not resuscitate: Secondary | ICD-10-CM | POA: Diagnosis present

## 2023-09-08 DIAGNOSIS — K5730 Diverticulosis of large intestine without perforation or abscess without bleeding: Secondary | ICD-10-CM | POA: Diagnosis not present

## 2023-09-08 DIAGNOSIS — M79604 Pain in right leg: Secondary | ICD-10-CM | POA: Diagnosis not present

## 2023-09-08 DIAGNOSIS — I081 Rheumatic disorders of both mitral and tricuspid valves: Secondary | ICD-10-CM | POA: Diagnosis present

## 2023-09-08 DIAGNOSIS — I272 Pulmonary hypertension, unspecified: Secondary | ICD-10-CM | POA: Diagnosis present

## 2023-09-08 DIAGNOSIS — Z9981 Dependence on supplemental oxygen: Secondary | ICD-10-CM

## 2023-09-08 DIAGNOSIS — R1030 Lower abdominal pain, unspecified: Secondary | ICD-10-CM | POA: Diagnosis not present

## 2023-09-08 DIAGNOSIS — I3481 Nonrheumatic mitral (valve) annulus calcification: Secondary | ICD-10-CM | POA: Diagnosis not present

## 2023-09-08 DIAGNOSIS — E785 Hyperlipidemia, unspecified: Secondary | ICD-10-CM | POA: Diagnosis present

## 2023-09-08 DIAGNOSIS — N200 Calculus of kidney: Secondary | ICD-10-CM | POA: Diagnosis not present

## 2023-09-08 LAB — CBC
HCT: 32.7 % — ABNORMAL LOW (ref 36.0–46.0)
Hemoglobin: 9.7 g/dL — ABNORMAL LOW (ref 12.0–15.0)
MCH: 28.6 pg (ref 26.0–34.0)
MCHC: 29.7 g/dL — ABNORMAL LOW (ref 30.0–36.0)
MCV: 96.5 fL (ref 80.0–100.0)
Platelets: 168 10*3/uL (ref 150–400)
RBC: 3.39 MIL/uL — ABNORMAL LOW (ref 3.87–5.11)
RDW: 13.7 % (ref 11.5–15.5)
WBC: 5.7 10*3/uL (ref 4.0–10.5)
nRBC: 0 % (ref 0.0–0.2)

## 2023-09-08 LAB — COMPREHENSIVE METABOLIC PANEL WITH GFR
ALT: 12 U/L (ref 0–44)
AST: 19 U/L (ref 15–41)
Albumin: 4.1 g/dL (ref 3.5–5.0)
Alkaline Phosphatase: 74 U/L (ref 38–126)
Anion gap: 14 (ref 5–15)
BUN: 83 mg/dL — ABNORMAL HIGH (ref 8–23)
CO2: 19 mmol/L — ABNORMAL LOW (ref 22–32)
Calcium: 9.5 mg/dL (ref 8.9–10.3)
Chloride: 97 mmol/L — ABNORMAL LOW (ref 98–111)
Creatinine, Ser: 4.98 mg/dL — ABNORMAL HIGH (ref 0.44–1.00)
GFR, Estimated: 8 mL/min — ABNORMAL LOW (ref >60)
Glucose, Bld: 105 mg/dL — ABNORMAL HIGH (ref 70–99)
Potassium: 5.9 mmol/L — ABNORMAL HIGH (ref 3.5–5.1)
Sodium: 130 mmol/L — ABNORMAL LOW (ref 135–145)
Total Bilirubin: 0.8 mg/dL (ref 0.0–1.2)
Total Protein: 7.2 g/dL (ref 6.5–8.1)

## 2023-09-08 LAB — CBG MONITORING, ED: Glucose-Capillary: 97 mg/dL (ref 70–99)

## 2023-09-08 LAB — BRAIN NATRIURETIC PEPTIDE: B Natriuretic Peptide: 2213 pg/mL — ABNORMAL HIGH (ref 0.0–100.0)

## 2023-09-08 LAB — MAGNESIUM: Magnesium: 3.1 mg/dL — ABNORMAL HIGH (ref 1.7–2.4)

## 2023-09-08 LAB — TROPONIN I (HIGH SENSITIVITY): Troponin I (High Sensitivity): 55 ng/L — ABNORMAL HIGH (ref <18)

## 2023-09-08 LAB — LIPASE, BLOOD: Lipase: 43 U/L (ref 11–51)

## 2023-09-08 MED ORDER — FUROSEMIDE 10 MG/ML IJ SOLN
60.0000 mg | Freq: Once | INTRAMUSCULAR | Status: AC
  Administered 2023-09-08: 60 mg via INTRAVENOUS
  Filled 2023-09-08: qty 6

## 2023-09-08 MED ORDER — FUROSEMIDE 10 MG/ML IJ SOLN
10.0000 mg | Freq: Once | INTRAMUSCULAR | Status: AC
  Administered 2023-09-08: 10 mg via INTRAVENOUS
  Filled 2023-09-08: qty 2

## 2023-09-08 MED ORDER — DEXTROSE 10 % IV SOLN: Freq: Once | INTRAVENOUS | Status: DC

## 2023-09-08 MED ORDER — INSULIN ASPART 100 UNIT/ML IV SOLN: 5.0000 [IU] | Freq: Once | INTRAVENOUS | Status: DC

## 2023-09-08 MED ORDER — SODIUM ZIRCONIUM CYCLOSILICATE 5 G PO PACK
10.0000 g | PACK | Freq: Once | ORAL | Status: AC
  Administered 2023-09-08: 10 g via ORAL
  Filled 2023-09-08: qty 2

## 2023-09-08 MED ORDER — DEXTROSE 50 % IV SOLN
1.0000 | Freq: Once | INTRAVENOUS | Status: DC
  Filled 2023-09-08: qty 50

## 2023-09-08 MED ORDER — NITROGLYCERIN 2 % TD OINT
1.0000 [in_us] | TOPICAL_OINTMENT | Freq: Once | TRANSDERMAL | Status: AC
Start: 2023-09-08 — End: 2023-09-08
  Administered 2023-09-08: 1 [in_us] via TOPICAL
  Filled 2023-09-08: qty 1

## 2023-09-08 MED ORDER — SODIUM CHLORIDE 0.9 % IV BOLUS
500.0000 mL | Freq: Once | INTRAVENOUS | Status: AC
  Administered 2023-09-08: 500 mL via INTRAVENOUS

## 2023-09-08 NOTE — ED Provider Notes (Signed)
 [place] EMERGENCY DEPARTMENT AT [facility] Provider Note   CSN: [id number] Arrival date & time: 09/08/23  1806     History  Chief Complaint  Patient presents with   Abdominal Pain    [name] is a 89 y.o. female.  Patient is a 89 year old female who presents to the emergency department the chief complaint of abdominal pain, shortness of breath and possible constipation.  Patient notes that she has not had a bowel movement in approximately 3 days.  Patient also admits to increased shortness of breath at home.  She does note that she recently decreased her diuretic.  She denies any associated chest pain at this time.  She has had no dizziness, lightheadedness or syncope.   Abdominal Pain Associated symptoms: shortness of breath        Home Medications Prior to Admission medications   Medication Sig Start Date End Date Taking? Authorizing Provider  albuterol (VENTOLIN HFA) 108 (90 Base) MCG/ACT inhaler TAKE 2 PUFFS EVERY 6 HOURS AS NEEDED Patient taking differently: Inhale 2 puffs into the lungs every 6 (six) hours as needed for wheezing or shortness of breath. TAKE 2 PUFFS EVERY 6 HOURS AS NEEDED 10/23/20   Babs Sciara, MD  diltiazem (CARDIZEM CD) 180 MG 24 hr capsule Take 1 capsule (180 mg total) by mouth daily. 08/30/23   Babs Sciara, MD  famotidine (PEPCID) 20 MG tablet Take 1 tablet (20 mg total) by mouth daily. 08/30/23   Luking, Jonna Coup, MD  GOODSENSE TUSSIN DM 20-200 MG/20ML LIQD Take 10 mLs by mouth every 4 (four) hours as needed. 03/05/22   [provider]  guaiFENesin-dextromethorphan (ROBITUSSIN DM) 100-10 MG/5ML syrup Take 10 mLs by mouth every 4 (four) hours as needed for cough. 03/05/22   Sherryll Burger, Pratik D, DO  hydrALAZINE (APRESOLINE) 25 MG tablet One 2 times a day 08/30/23   Babs Sciara, MD  ondansetron (ZOFRAN-ODT) 4 MG disintegrating tablet Take 1 tablet (4 mg total) by mouth every 8 (eight) hours as needed. 03/09/23   Horton,  Mayer Masker, MD  OXYGEN Inhale 2 L into the lungs continuous.    [provider]  prednisoLONE acetate (PRED FORTE) 1 % ophthalmic suspension Place 1 drop into the right eye 4 (four) times daily. 02/22/22   [provider]  SENNA-PLUS 8.6-50 MG tablet Take by mouth 2 (two) times daily. 11/23/21   [provider]  torsemide (DEMADEX) 20 MG tablet TAKE 3 TABLETS BY MOUTH DAILY 09/07/23   Babs Sciara, MD  Travoprost, BAK Free, (TRAVATAN) 0.004 % SOLN ophthalmic solution Place 1 drop into both eyes at bedtime.     [provider]  ursodiol (ACTIGALL) 300 MG capsule Take 1 capsule (300 mg total) by mouth 2 (two) times daily. 08/30/23   Babs Sciara, MD      Allergies    Tramadol    Review of Systems   Review of Systems  Respiratory:  Positive for shortness of breath.   Gastrointestinal:  Positive for abdominal pain.  All other systems reviewed and are negative.   Physical Exam Updated Vital Signs BP (!) 196/52 (BP Location: Left Arm)   Pulse 76   Temp 98.4 F (36.9 C) (Oral)   Resp 19   Ht 5\' 2"  (1.575 m)   Wt 58 kg   SpO2 97%   BMI 23.39 kg/m  Physical Exam Vitals and nursing note reviewed.  Constitutional:      Appearance: Normal appearance.  HENT:     Head: Normocephalic and atraumatic.     Nose: Nose normal.     Mouth/Throat:     Mouth: Mucous membranes are moist.  Eyes:     Extraocular Movements: Extraocular movements intact.     Conjunctiva/sclera: Conjunctivae normal.     Pupils: Pupils are equal, round, and reactive to light.  Cardiovascular:     Rate and Rhythm: Normal rate and regular rhythm.     Pulses: Normal pulses.     Heart sounds: Normal heart sounds. No murmur heard.    No gallop.  Pulmonary:     Effort: Pulmonary effort is normal.     Breath sounds: Rales present.  Abdominal:     General: Abdomen is flat. Bowel sounds are normal. There is no distension.     Palpations: Abdomen is soft.     Tenderness: There is  abdominal tenderness in the suprapubic area. There is no guarding. Negative signs include Murphy's sign and McBurney's sign.     Hernia: No hernia is present.  Musculoskeletal:        General: Normal range of motion.     Cervical back: Normal range of motion and neck supple.  Skin:    General: Skin is warm and dry.     Findings: No rash.  Neurological:     General: No focal deficit present.     Mental Status: She is alert and oriented to person, place, and time. Mental status is at baseline.  Psychiatric:        Mood and Affect: Mood normal.        Behavior: Behavior normal.        Thought Content: Thought content normal.        Judgment: Judgment normal.     ED Results / Procedures / Treatments   Labs (all labs ordered are listed, but only abnormal results are displayed) Labs Reviewed  COMPREHENSIVE METABOLIC PANEL WITH GFR - Abnormal; Notable for the following components:      Result Value   Sodium 130 (*)    Potassium 5.9 (*)    Chloride 97 (*)    CO2 19 (*)    Glucose, Bld 105 (*)    BUN 83 (*)    Creatinine, Ser 4.98 (*)    GFR, Estimated 8 (*)    All other components within normal limits  CBC - Abnormal; Notable for the following components:   RBC 3.39 (*)    Hemoglobin 9.7 (*)    HCT 32.7 (*)    MCHC 29.7 (*)    All other components within normal limits  LIPASE, BLOOD  URINALYSIS, ROUTINE W REFLEX MICROSCOPIC  MAGNESIUM  GLUCOSE, RANDOM    EKG None  Radiology DG Abdomen 1 View Result Date: 09/08/2023 CLINICAL DATA:  Constipation for 3 days and lower abdominal pain EXAM: ABDOMEN - 1 VIEW COMPARISON:  CT and ultrasound 03/09/2023 FINDINGS: Nonobstructed bowel-gas pattern. Large stool burden in the right colon. 8 mm stone in the right kidney. Calcified uterine fibroids. IMPRESSION: Nonobstructed bowel-gas pattern. Large stool burden in the right colon. Electronically Signed   By: Minerva Fester M.D.   On: 09/08/2023 19:05    Procedures Procedures     Medications Ordered in ED Medications  sodium chloride 0.9 % bolus 500 mL (has no administration in time range)  insulin aspart (novoLOG) injection 5 Units (has no administration in time range)    And  dextrose 50 % solution 50 mL (has no administration in  time range)  dextrose 10 % infusion (has no administration in time range)    ED Course/ Medical Decision Making/ A&P                                 Medical Decision Making Amount and/or Complexity of Data Reviewed Labs: ordered. Radiology: ordered. ECG/medicine tests: ordered.  Risk OTC drugs. Prescription drug management. Decision regarding hospitalization.   This patient presents to the ED for concern of shortness of breath, abdominal pain, this involves an extensive number of treatment options, and is a complaint that carries with it a high risk of complications and morbidity.  The differential diagnosis includes ACS, pulmonary embolus, CHF, pneumonia, acute cholecystitis, appendicitis, small bowel obstruction, diverticulitis, pyelonephritis, kidney stone   Co morbidities that complicate the patient evaluation  CHF and chronic kidney disease   Additional history obtained:  Additional history obtained from medical records and family External records from outside source obtained and reviewed including medical records   Lab Tests:  I Ordered, and personally interpreted labs.  The pertinent results include: Elevated BNP, elevated creatinine, elevated troponin, elevated potassium, elevated magnesium   Imaging Studies ordered:  I ordered imaging studies including CT scan of the abdomen and pelvis, chest x-ray I independently visualized and interpreted imaging which showed fluid overload I agree with the radiologist interpretation   Cardiac Monitoring: / EKG:  The patient was maintained on a cardiac monitor.  I personally viewed and interpreted the cardiac monitored which showed an underlying rhythm of:  Atrial fibrillation, no ST/T wave changes, no ischemic changes, no STEMI   Consultations Obtained:  I requested consultation with the specialist,  and discussed lab and imaging findings as well as pertinent plan - they recommend: Admission   Problem List / ED Course / Critical interventions / Medication management  Patient does remain stable at this time.  Vital signs have improved with treatment in the emergency department and patient is now urinating without difficulty.  Suspect that she is fluid overloaded at this point.  Will plan for admission to the hospital service given her acute fluid overload, hyperkalemia, hypermagnesemia for further diuresis.  Patient had no acute changes on EKG.  Do not underlying infectious source at this point.  Will sign patient out to Dr. Blinda Leatherwood for admission and conversation with hospitalist service. I ordered medication including Lasix, nitroglycerin ointment, lidocaine, lokelma for CHF, hyperkalemia Reevaluation of the patient after these medicines showed that the patient improved I have reviewed the patients home medicines and have made adjustments as needed   Social Determinants of Health:  None   Test / Admission - Considered:  Admission        Final Clinical Impression(s) / ED Diagnoses Final diagnoses:  None    Rx / DC Orders ED Discharge Orders     None         Lelon Perla, PA-C 09/08/23 2346    Lonell Grandchild, MD 09/09/23 0005

## 2023-09-08 NOTE — ED Triage Notes (Signed)
 Pt arrived via [facility] from home c/o constipation X 3 days and lower abdominal pain.

## 2023-09-08 NOTE — ED Notes (Signed)
 Bladder scan performed. Results ">92". Provider notified

## 2023-09-08 NOTE — ED Notes (Signed)
 Patient transported to CT

## 2023-09-08 NOTE — ED Notes (Signed)
 Portable xray at bedside.

## 2023-09-09 DIAGNOSIS — I5033 Acute on chronic diastolic (congestive) heart failure: Secondary | ICD-10-CM

## 2023-09-09 DIAGNOSIS — I4819 Other persistent atrial fibrillation: Secondary | ICD-10-CM | POA: Diagnosis not present

## 2023-09-09 DIAGNOSIS — E1122 Type 2 diabetes mellitus with diabetic chronic kidney disease: Secondary | ICD-10-CM | POA: Diagnosis present

## 2023-09-09 DIAGNOSIS — I10 Essential (primary) hypertension: Secondary | ICD-10-CM | POA: Diagnosis not present

## 2023-09-09 DIAGNOSIS — N281 Cyst of kidney, acquired: Secondary | ICD-10-CM | POA: Diagnosis not present

## 2023-09-09 DIAGNOSIS — I272 Pulmonary hypertension, unspecified: Secondary | ICD-10-CM | POA: Diagnosis present

## 2023-09-09 DIAGNOSIS — Z87442 Personal history of urinary calculi: Secondary | ICD-10-CM | POA: Diagnosis not present

## 2023-09-09 DIAGNOSIS — N185 Chronic kidney disease, stage 5: Secondary | ICD-10-CM | POA: Diagnosis present

## 2023-09-09 DIAGNOSIS — I4821 Permanent atrial fibrillation: Secondary | ICD-10-CM | POA: Diagnosis present

## 2023-09-09 DIAGNOSIS — E785 Hyperlipidemia, unspecified: Secondary | ICD-10-CM | POA: Diagnosis present

## 2023-09-09 DIAGNOSIS — Z515 Encounter for palliative care: Secondary | ICD-10-CM | POA: Diagnosis not present

## 2023-09-09 DIAGNOSIS — I509 Heart failure, unspecified: Secondary | ICD-10-CM

## 2023-09-09 DIAGNOSIS — K5900 Constipation, unspecified: Secondary | ICD-10-CM | POA: Diagnosis present

## 2023-09-09 DIAGNOSIS — N184 Chronic kidney disease, stage 4 (severe): Secondary | ICD-10-CM | POA: Diagnosis not present

## 2023-09-09 DIAGNOSIS — N261 Atrophy of kidney (terminal): Secondary | ICD-10-CM | POA: Diagnosis not present

## 2023-09-09 DIAGNOSIS — I132 Hypertensive heart and chronic kidney disease with heart failure and with stage 5 chronic kidney disease, or end stage renal disease: Secondary | ICD-10-CM | POA: Diagnosis present

## 2023-09-09 DIAGNOSIS — R9431 Abnormal electrocardiogram [ECG] [EKG]: Secondary | ICD-10-CM | POA: Diagnosis not present

## 2023-09-09 DIAGNOSIS — J4489 Other specified chronic obstructive pulmonary disease: Secondary | ICD-10-CM | POA: Diagnosis present

## 2023-09-09 DIAGNOSIS — Z950 Presence of cardiac pacemaker: Secondary | ICD-10-CM

## 2023-09-09 DIAGNOSIS — E118 Type 2 diabetes mellitus with unspecified complications: Secondary | ICD-10-CM | POA: Diagnosis not present

## 2023-09-09 DIAGNOSIS — Z8673 Personal history of transient ischemic attack (TIA), and cerebral infarction without residual deficits: Secondary | ICD-10-CM | POA: Diagnosis not present

## 2023-09-09 DIAGNOSIS — E875 Hyperkalemia: Secondary | ICD-10-CM | POA: Diagnosis present

## 2023-09-09 DIAGNOSIS — J9611 Chronic respiratory failure with hypoxia: Secondary | ICD-10-CM | POA: Diagnosis present

## 2023-09-09 DIAGNOSIS — I5043 Acute on chronic combined systolic (congestive) and diastolic (congestive) heart failure: Secondary | ICD-10-CM | POA: Diagnosis not present

## 2023-09-09 DIAGNOSIS — G40909 Epilepsy, unspecified, not intractable, without status epilepticus: Secondary | ICD-10-CM | POA: Diagnosis present

## 2023-09-09 DIAGNOSIS — Z96651 Presence of right artificial knee joint: Secondary | ICD-10-CM | POA: Diagnosis present

## 2023-09-09 DIAGNOSIS — Z79899 Other long term (current) drug therapy: Secondary | ICD-10-CM | POA: Diagnosis not present

## 2023-09-09 DIAGNOSIS — I5023 Acute on chronic systolic (congestive) heart failure: Secondary | ICD-10-CM | POA: Diagnosis not present

## 2023-09-09 DIAGNOSIS — Z66 Do not resuscitate: Secondary | ICD-10-CM | POA: Diagnosis present

## 2023-09-09 DIAGNOSIS — Z9981 Dependence on supplemental oxygen: Secondary | ICD-10-CM | POA: Diagnosis not present

## 2023-09-09 DIAGNOSIS — I081 Rheumatic disorders of both mitral and tricuspid valves: Secondary | ICD-10-CM | POA: Diagnosis present

## 2023-09-09 DIAGNOSIS — E872 Acidosis, unspecified: Secondary | ICD-10-CM | POA: Diagnosis present

## 2023-09-09 DIAGNOSIS — N179 Acute kidney failure, unspecified: Secondary | ICD-10-CM | POA: Diagnosis present

## 2023-09-09 LAB — URINALYSIS, ROUTINE W REFLEX MICROSCOPIC
Bilirubin Urine: NEGATIVE
Glucose, UA: NEGATIVE mg/dL
Hgb urine dipstick: NEGATIVE
Ketones, ur: NEGATIVE mg/dL
Nitrite: NEGATIVE
Protein, ur: NEGATIVE mg/dL
Specific Gravity, Urine: 1.006 (ref 1.005–1.030)
pH: 5 (ref 5.0–8.0)

## 2023-09-09 LAB — TROPONIN I (HIGH SENSITIVITY): Troponin I (High Sensitivity): 55 ng/L — ABNORMAL HIGH (ref <18)

## 2023-09-09 LAB — MRSA NEXT GEN BY PCR, NASAL: MRSA by PCR Next Gen: NOT DETECTED

## 2023-09-09 MED ORDER — FUROSEMIDE 10 MG/ML IJ SOLN
60.0000 mg | Freq: Two times a day (BID) | INTRAMUSCULAR | Status: DC
  Administered 2023-09-10 – 2023-09-11 (×3): 60 mg via INTRAVENOUS
  Filled 2023-09-09 (×3): qty 6

## 2023-09-09 MED ORDER — MUSCLE RUB 10-15 % EX CREA
1.0000 | TOPICAL_CREAM | CUTANEOUS | Status: DC | PRN
  Administered 2023-09-09 – 2023-09-11 (×3): 1 via TOPICAL

## 2023-09-09 MED ORDER — ONDANSETRON HCL 4 MG PO TABS: 4.0000 mg | ORAL_TABLET | Freq: Four times a day (QID) | ORAL | Status: DC | PRN

## 2023-09-09 MED ORDER — SODIUM ZIRCONIUM CYCLOSILICATE 10 G PO PACK
10.0000 g | PACK | Freq: Two times a day (BID) | ORAL | Status: DC
  Administered 2023-09-09 – 2023-09-11 (×4): 10 g via ORAL
  Filled 2023-09-09 (×4): qty 1

## 2023-09-09 MED ORDER — FUROSEMIDE 10 MG/ML IJ SOLN
80.0000 mg | Freq: Once | INTRAMUSCULAR | Status: AC
  Administered 2023-09-09: 80 mg via INTRAVENOUS
  Filled 2023-09-09: qty 8

## 2023-09-09 MED ORDER — SENNOSIDES-DOCUSATE SODIUM 8.6-50 MG PO TABS
2.0000 | ORAL_TABLET | Freq: Two times a day (BID) | ORAL | Status: DC
  Administered 2023-09-09 – 2023-09-11 (×5): 2 via ORAL
  Filled 2023-09-09 (×5): qty 2

## 2023-09-09 MED ORDER — ONDANSETRON HCL 4 MG/2ML IJ SOLN
4.0000 mg | Freq: Once | INTRAMUSCULAR | Status: AC
  Administered 2023-09-09: 4 mg via INTRAVENOUS
  Filled 2023-09-09: qty 2

## 2023-09-09 MED ORDER — ALBUTEROL SULFATE (2.5 MG/3ML) 0.083% IN NEBU: 2.5000 mg | INHALATION_SOLUTION | RESPIRATORY_TRACT | Status: DC | PRN

## 2023-09-09 MED ORDER — CHLORHEXIDINE GLUCONATE CLOTH 2 % EX PADS
6.0000 | MEDICATED_PAD | Freq: Every day | CUTANEOUS | Status: DC
  Administered 2023-09-09 – 2023-09-11 (×3): 6 via TOPICAL

## 2023-09-09 MED ORDER — SODIUM CHLORIDE 0.9 % IV SOLN: INTRAVENOUS | Status: AC | PRN

## 2023-09-09 MED ORDER — SODIUM CHLORIDE 0.9% FLUSH
3.0000 mL | Freq: Two times a day (BID) | INTRAVENOUS | Status: DC
  Administered 2023-09-09 – 2023-09-11 (×4): 3 mL via INTRAVENOUS

## 2023-09-09 MED ORDER — LATANOPROST 0.005 % OP SOLN
1.0000 [drp] | Freq: Every day | OPHTHALMIC | Status: DC
  Administered 2023-09-09 – 2023-09-10 (×2): 1 [drp] via OPHTHALMIC
  Filled 2023-09-09: qty 2.5

## 2023-09-09 MED ORDER — HYDRALAZINE HCL 25 MG PO TABS
25.0000 mg | ORAL_TABLET | Freq: Three times a day (TID) | ORAL | Status: DC
  Administered 2023-09-09 (×2): 25 mg via ORAL
  Filled 2023-09-09 (×2): qty 1

## 2023-09-09 MED ORDER — FUROSEMIDE 10 MG/ML IJ SOLN
40.0000 mg | Freq: Two times a day (BID) | INTRAMUSCULAR | Status: DC
  Administered 2023-09-09: 40 mg via INTRAVENOUS
  Filled 2023-09-09: qty 4

## 2023-09-09 MED ORDER — ACETAMINOPHEN 325 MG PO TABS
650.0000 mg | ORAL_TABLET | Freq: Four times a day (QID) | ORAL | Status: DC | PRN
  Administered 2023-09-09 – 2023-09-11 (×2): 650 mg via ORAL
  Filled 2023-09-09 (×2): qty 2

## 2023-09-09 MED ORDER — POLYETHYLENE GLYCOL 3350 17 G PO PACK: 17.0000 g | PACK | Freq: Every day | ORAL | Status: DC | PRN

## 2023-09-09 MED ORDER — DILTIAZEM HCL ER COATED BEADS 180 MG PO CP24
180.0000 mg | ORAL_CAPSULE | Freq: Every day | ORAL | Status: DC
  Administered 2023-09-09 – 2023-09-11 (×3): 180 mg via ORAL
  Filled 2023-09-09 (×3): qty 1

## 2023-09-09 MED ORDER — ONDANSETRON HCL 4 MG/2ML IJ SOLN
4.0000 mg | Freq: Four times a day (QID) | INTRAMUSCULAR | Status: DC | PRN
Start: 2023-09-09 — End: 2023-09-11

## 2023-09-09 MED ORDER — CALCIUM GLUCONATE-NACL 1-0.675 GM/50ML-% IV SOLN
1.0000 g | Freq: Once | INTRAVENOUS | Status: AC
  Administered 2023-09-09: 1000 mg via INTRAVENOUS
  Filled 2023-09-09: qty 50

## 2023-09-09 MED ORDER — ACETAMINOPHEN 650 MG RE SUPP: 650.0000 mg | Freq: Four times a day (QID) | RECTAL | Status: DC | PRN

## 2023-09-09 MED ORDER — SODIUM CHLORIDE 0.9% FLUSH: 3.0000 mL | INTRAVENOUS | Status: DC | PRN

## 2023-09-09 MED ORDER — FAMOTIDINE 20 MG PO TABS
20.0000 mg | ORAL_TABLET | Freq: Every day | ORAL | Status: DC
  Administered 2023-09-09 – 2023-09-11 (×3): 20 mg via ORAL
  Filled 2023-09-09 (×3): qty 1

## 2023-09-09 MED ORDER — HYDRALAZINE HCL 50 MG PO TABS
50.0000 mg | ORAL_TABLET | Freq: Three times a day (TID) | ORAL | Status: DC
Start: 2023-09-09 — End: 2023-09-11
  Administered 2023-09-09 – 2023-09-11 (×5): 50 mg via ORAL
  Filled 2023-09-09 (×5): qty 1

## 2023-09-09 MED ORDER — ORAL CARE MOUTH RINSE: 15.0000 mL | OROMUCOSAL | Status: DC | PRN

## 2023-09-09 MED ORDER — SODIUM BICARBONATE 650 MG PO TABS
1300.0000 mg | ORAL_TABLET | Freq: Two times a day (BID) | ORAL | Status: DC
  Administered 2023-09-09 – 2023-09-11 (×4): 1300 mg via ORAL
  Filled 2023-09-09 (×4): qty 2

## 2023-09-09 MED ORDER — HYDRALAZINE HCL 20 MG/ML IJ SOLN: 10.0000 mg | Freq: Four times a day (QID) | INTRAMUSCULAR | Status: DC | PRN

## 2023-09-09 MED ORDER — URSODIOL 300 MG PO CAPS
300.0000 mg | ORAL_CAPSULE | Freq: Two times a day (BID) | ORAL | Status: DC
Start: 2023-09-09 — End: 2023-09-11
  Administered 2023-09-09 – 2023-09-11 (×5): 300 mg via ORAL
  Filled 2023-09-09 (×7): qty 1

## 2023-09-09 MED ORDER — BISACODYL 10 MG RE SUPP: 10.0000 mg | Freq: Every day | RECTAL | Status: DC | PRN

## 2023-09-09 MED ORDER — POLYETHYLENE GLYCOL 3350 17 G PO PACK
17.0000 g | PACK | Freq: Two times a day (BID) | ORAL | Status: DC
  Administered 2023-09-09 – 2023-09-11 (×5): 17 g via ORAL
  Filled 2023-09-09 (×5): qty 1

## 2023-09-09 MED ORDER — PREDNISOLONE ACETATE 1 % OP SUSP
1.0000 [drp] | Freq: Four times a day (QID) | OPHTHALMIC | Status: DC
  Administered 2023-09-09 – 2023-09-11 (×10): 1 [drp] via OPHTHALMIC
  Filled 2023-09-09: qty 1

## 2023-09-09 MED ORDER — SODIUM CHLORIDE 0.9% FLUSH
3.0000 mL | Freq: Two times a day (BID) | INTRAVENOUS | Status: DC
  Administered 2023-09-09 – 2023-09-11 (×5): 3 mL via INTRAVENOUS

## 2023-09-09 MED ORDER — LABETALOL HCL 5 MG/ML IV SOLN
10.0000 mg | INTRAVENOUS | Status: DC | PRN
  Administered 2023-09-10: 10 mg via INTRAVENOUS
  Filled 2023-09-09: qty 4

## 2023-09-09 MED ORDER — TRAZODONE HCL 50 MG PO TABS: 50.0000 mg | ORAL_TABLET | Freq: Every evening | ORAL | Status: DC | PRN

## 2023-09-09 MED ORDER — HEPARIN SODIUM (PORCINE) 5000 UNIT/ML IJ SOLN
5000.0000 [IU] | Freq: Three times a day (TID) | INTRAMUSCULAR | Status: DC
  Administered 2023-09-09 – 2023-09-11 (×7): 5000 [IU] via SUBCUTANEOUS
  Filled 2023-09-09 (×7): qty 1

## 2023-09-09 MED ORDER — HEPARIN SODIUM (PORCINE) 5000 UNIT/ML IJ SOLN: 5000.0000 [IU] | Freq: Three times a day (TID) | INTRAMUSCULAR | Status: DC

## 2023-09-09 NOTE — Progress Notes (Signed)
   09/09/23 0859  TOC Brief Assessment  Insurance and Status Reviewed  Patient has primary care physician Yes  Home environment has been reviewed from home  Prior level of function: assisted by family as needed  Prior/Current Home Services Current home services (Hospice active at home)  Social Drivers of Health Review SDOH reviewed no interventions necessary  Readmission risk has been reviewed Yes  Transition of care needs no transition of care needs at this time    Pt admitted from home. She lives with family and is active with [facility] at home. Updated Rae Halsted at [facility]. Per Rae Halsted, pt has been active with hospice since July of 2022. Anticipating pt will return home with resumption of hospice care. TOC will follow and assist if needs arise.

## 2023-09-09 NOTE — ED Notes (Signed)
 Patient reporting nausea. Admitting provider in ED and notified face to face

## 2023-09-09 NOTE — H&P (Signed)
 History and Physical   Patient: [name] ZOX:[id number] DOB: 07-26-1926 DOA: 09/08/2023 DOS: the patient was seen and examined on 09/09/2023 PCP: Babs Sciara, MD  Patient coming from: Home  Chief Complaint:  Chief Complaint  Patient presents with   Abdominal Pain   HPI: [name] is a 89 y.o. female with medical history significant of permanent atrial fibrillation with history of SSS,  status post pacemaker placement, HTN, CKD stage IV, chronic atrial fibrillation,HLD, COPD , prior history of stroke and chronic hypoxic respiratory failure usually uses 2 L of oxygen via nasal cannula at home presenting with increasing lower extremity edema and increasing dyspnea. -Additional history obtained from patient's husband at bedside  -Patient also reports abdominal discomfort and constipation concerns, No fever  Or chills  -No nausea no vomiting -No urinary concerns -CT abdomen pelvis and chest x-ray consistent with CHF with pleural effusions and large stool burden -BNP very elevated at 2,213 Troponin 55 >> 55, EKG consistent with A-fib -Hemoglobin 9.7 which is not far from baseline, WBC 5.7 platelets 168 -- UA not consistent with UTI -Lipase is not elevated -Sodium is 130, potassium is 5.9 -Creatinine 4.98 up from 3.25 on 08/30/2023 -LFTs are not elevated   Review of Systems: As mentioned in the history of present illness. All other systems reviewed and are negative. 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 ;magnetic MRI->  multiple cerebral infarctions; Rx-ASA but subsequently changed to coumadin  when found to have rheumatic mitral  valve disease carotid duplex mild plaque in 08/2008   Chronic bronchitis    Chronic renal insufficiency    baseline creatine 1.4; 1.04 in 03/2010   DJD (degenerative joint disease)    hands, knees   Gout    Hyperlipidemia    Hypertension    heart disease diastolic dysfunction ; EF-80%; mild pulmonary edema in 2006;responded to diurectics ; LVH   Kidney stones    Mitral valve disease    Severe mitral annular calcification; mild to moderate stenosis-not clearly rheumatic   Nephrolithiasis    Pacemaker    Retinal hemorrhage    Seizure disorder (HCC)    Shortness of breath    only with exertion   Stroke (HCC)    Tachycardia-bradycardia syndrome (HCC)    with pauses and syncope;PPM implantation 10/2008,negative stress nuclear study 03/2005   Vertigo    Past Surgical History:  Procedure Laterality Date   CATARACT EXTRACTION W/PHACO  03/29/2011   Procedure: CATARACT EXTRACTION PHACO AND INTRAOCULAR LENS PLACEMENT (IOC);  Surgeon: Gemma Payor;  Location: [facility];  Service: Ophthalmology;  Laterality: Right;  CDE 12.62   INSERT / REPLACE / REMOVE PACEMAKER  11/2008   [employer] DUAL chamber pacemaker 11/06/2008   MEMBRANE PEEL Right 02/12/2014   Procedure: MEMBRANE PEEL;  Surgeon: Sherrie George, MD;  Location: [facility] OR;  Service: Ophthalmology;  Laterality: Right;   PARS PLANA VITRECTOMY Right 02/12/2014   Procedure: PARS PLANA VITRECTOMY WITH 25 GAUGE;  Surgeon: Sherrie George, MD;  Location: [facility] OR;  Service: Ophthalmology;  Laterality: Right;   PPM GENERATOR CHANGEOUT N/A 03/17/2020   Procedure: PPM  GENERATOR CHANGEOUT;  Surgeon: Duke Salvia, MD;  Location: [facility] INVASIVE CV LAB;  Service: Cardiovascular;  Laterality: N/A;   TOTAL KNEE ARTHROPLASTY  04/2009   Right   Social History:  reports that she has never smoked. She has never been exposed to tobacco smoke. She has never used smokeless tobacco. She reports that she does not drink alcohol and does not use drugs.  Allergies  Allergen  Reactions   Tramadol     Drowsiness    Family History  Problem Relation Age of Onset   Cancer Other        unknown cancer   Hypotension Neg Hx    Anesthesia problems Neg Hx    Pseudochol deficiency Neg Hx    Malignant hyperthermia Neg Hx     Prior to Admission medications   Medication Sig Start Date End Date Taking? Authorizing Provider  albuterol (VENTOLIN HFA) 108 (90 Base) MCG/ACT inhaler TAKE 2 PUFFS EVERY 6 HOURS AS NEEDED 10/23/20  Yes Luking, Jonna Coup, MD  diltiazem (CARDIZEM CD) 180 MG 24 hr capsule Take 1 capsule (180 mg total) by mouth daily. 08/30/23  Yes Luking, Jonna Coup, MD  guaiFENesin-dextromethorphan (ROBITUSSIN DM) 100-10 MG/5ML syrup Take 10 mLs by mouth every 4 (four) hours as needed for cough. 03/05/22  Yes Sherryll Burger, Pratik D, DO  hydrALAZINE (APRESOLINE) 25 MG tablet One 2 times a day 08/30/23  Yes Luking, Scott A, MD  ondansetron (ZOFRAN-ODT) 4 MG disintegrating tablet Take 1 tablet (4 mg total) by mouth every 8 (eight) hours as needed. 03/09/23  Yes Horton, Mayer Masker, MD  pantoprazole (PROTONIX) 40 MG tablet Take 40 mg by mouth daily.   Yes [provider]  prednisoLONE acetate (PRED FORTE) 1 % ophthalmic suspension Place 1 drop into the right eye daily as needed (Inflammation). 02/22/22  Yes [provider]  SENNA-PLUS 8.6-50 MG tablet Take by mouth 2 (two) times daily. 11/23/21  Yes [provider]  torsemide (DEMADEX) 20 MG tablet TAKE 3 TABLETS BY MOUTH DAILY Patient taking differently: Take 20 mg by mouth daily. 09/07/23  Yes Luking, Scott A, MD  Travoprost, BAK Free, (TRAVATAN) 0.004 % SOLN ophthalmic solution Place 1 drop into both eyes at bedtime.    Yes [provider]  ursodiol (ACTIGALL) 300 MG capsule Take 1 capsule (300 mg total) by mouth 2 (two) times daily. 08/30/23  Yes Babs Sciara, MD  famotidine (PEPCID) 20 MG tablet Take 1 tablet (20 mg total) by mouth daily. Patient not taking: Reported on 09/09/2023 08/30/23   Babs Sciara, MD  OXYGEN Inhale 2 L into the lungs continuous.    [provider]    Physical Exam: Vitals:   09/09/23 1100 09/09/23 1200 09/09/23 1427 09/09/23 1543  BP: (!) 169/42 (!) 146/44 (!) 171/48   Pulse: 71 69    Resp: 20 (!) 29    Temp: 98.2 F (36.8 C)   97.7 F (36.5 C)  TempSrc: Oral   Oral  SpO2: 99% 99%    Weight:      Height:        Physical Exam  Gen:- Awake Alert, in no acute distress  HEENT:- [place].AT, No sclera icterus, left eye vision loss Nose- [place] 3L/min Neck-Supple Neck, +ve JVD,.  Lungs-respirations, faint bibasilar Rales  CV- S1, S2 normal, irregularly irregular, pacemaker in situ, Abd-  +ve B.Sounds, Abd Soft, No tenderness,    Extremity/Skin:- 2 + pitting  edema,   good pedal pulses  Psych-affect  is appropriate, oriented x3 Neuro-generalized weakness and deconditioning ,no new focal deficits, no tremors  Data Reviewed: -CT abdomen pelvis and chest x-ray consistent with CHF with pleural effusions and large stool burden -BNP very elevated at 2,213 Troponin 55 >> 55, EKG consistent with A-fib -Hemoglobin 9.7 which is not far from baseline, WBC 5.7 platelets 168 -- UA not consistent with UTI -Lipase is not elevated -Sodium is 130, potassium is 5.9 -Creatinine 4.98 up from 3.25 on 08/30/2023 -LFTs are not elevated  Assessment and Plan:  1)Acute on chronic diastolic CHF exacerbation--with severe pulmonary hypertension and valvular disease echo from 03/03/2021 with EF greater than 55% with moderate LVH,, with severe mitral stenosis, moderate tricuspid regurgitation, no aortic valve stenosis, severe pulmonary hypertension noted,  PASP 63, -- IV Lasix as ordered -Strict input and output monitoring and daily weights advised   2)Aki on CKD IV with hyperkalemia and metabolic acidosis---patient is oliguric -Creatinine 4.98 up from 3.25 on 08/30/2023 -Check renal ultrasound to rule out obstructive uropathy - Renally adjust medications, avoid nephrotoxic  agents / dehydration  / hypotension -Monitor renal function closely with diuresis   3) Permanent afib--history of sick sinus syndrome, status post pacemaker placement -- PTA patient was not on Coumadin (given severe mitral stenosis she will need Coumadin over DOAC) -Continue Cardizem for rate control -- Patient and husband declined anticoagulation at this time   4)HTN--continue Cardizem, hydralazine -May use abdomen Tylenol PR elevated BP  5)Constipation--imaging studies with large stool burden, husband reports constipation -CT chest ordered  6)Hyperkalemia with metabolic acidosis --- in the setting of AKI on CKD -Give Lokelma and calcium chloride -Lasix as ordered -Give bicarb   Advance Care Planning:   Code Status: Full Code   Consults: Palliative care consult requested  Family Communication: Discussed with patient's husband at bedside  Severity of Illness: The appropriate patient status for this patient is INPATIENT. Inpatient status is judged to be reasonable and necessary in order to provide the required intensity of service to ensure the patient's safety. The patient's presenting symptoms, physical exam findings, and initial radiographic and laboratory data in the context of their chronic comorbidities is felt to place them at high risk for further clinical deterioration. Furthermore, it is not anticipated that the patient will be medically stable for discharge from the hospital within 2 midnights of admission.   * I certify that at the point of admission it is my clinical judgment that the patient will require inpatient hospital care spanning beyond 2 midnights from the point of admission due to high intensity of service, high risk for further deterioration and high frequency of surveillance required.*  Author: Shon Hale, MD 09/09/2023 6:43 PM  For on call review [email or web address].

## 2023-09-09 NOTE — Plan of Care (Signed)

## 2023-09-10 ENCOUNTER — Telehealth: Payer: Self-pay | Admitting: Family Medicine

## 2023-09-10 ENCOUNTER — Inpatient Hospital Stay (HOSPITAL_COMMUNITY)

## 2023-09-10 ENCOUNTER — Other Ambulatory Visit (HOSPITAL_COMMUNITY): Payer: Self-pay | Admitting: *Deleted

## 2023-09-10 DIAGNOSIS — N184 Chronic kidney disease, stage 4 (severe): Secondary | ICD-10-CM | POA: Diagnosis not present

## 2023-09-10 DIAGNOSIS — R9431 Abnormal electrocardiogram [ECG] [EKG]: Secondary | ICD-10-CM

## 2023-09-10 DIAGNOSIS — I5023 Acute on chronic systolic (congestive) heart failure: Secondary | ICD-10-CM

## 2023-09-10 DIAGNOSIS — I5043 Acute on chronic combined systolic (congestive) and diastolic (congestive) heart failure: Secondary | ICD-10-CM | POA: Diagnosis not present

## 2023-09-10 DIAGNOSIS — I4819 Other persistent atrial fibrillation: Secondary | ICD-10-CM | POA: Diagnosis not present

## 2023-09-10 DIAGNOSIS — Z950 Presence of cardiac pacemaker: Secondary | ICD-10-CM | POA: Diagnosis not present

## 2023-09-10 LAB — ECHOCARDIOGRAM COMPLETE
AR max vel: 0.89 cm2
AV Area VTI: 0.99 cm2
AV Area mean vel: 0.96 cm2
AV Mean grad: 9 mmHg
AV Peak grad: 16.2 mmHg
Ao pk vel: 2.01 m/s
Area-P 1/2: 1.48 cm2
Height: 62 in
MV VTI: 0.48 cm2
S' Lateral: 2.2 cm
Weight: 2239.87 [oz_av]

## 2023-09-10 LAB — RENAL FUNCTION PANEL
Albumin: 3.5 g/dL (ref 3.5–5.0)
Anion gap: 11 (ref 5–15)
BUN: 84 mg/dL — ABNORMAL HIGH (ref 8–23)
CO2: 20 mmol/L — ABNORMAL LOW (ref 22–32)
Calcium: 9.1 mg/dL (ref 8.9–10.3)
Chloride: 101 mmol/L (ref 98–111)
Creatinine, Ser: 5.25 mg/dL — ABNORMAL HIGH (ref 0.44–1.00)
GFR, Estimated: 7 mL/min — ABNORMAL LOW (ref >60)
Glucose, Bld: 86 mg/dL (ref 70–99)
Phosphorus: 5.4 mg/dL — ABNORMAL HIGH (ref 2.5–4.6)
Potassium: 5.2 mmol/L — ABNORMAL HIGH (ref 3.5–5.1)
Sodium: 132 mmol/L — ABNORMAL LOW (ref 135–145)

## 2023-09-10 NOTE — Progress Notes (Signed)
*  PRELIMINARY RESULTS* Echocardiogram 2D Echocardiogram has been performed.  Stacey Drain 09/10/2023, 11:40 AM

## 2023-09-10 NOTE — Telephone Encounter (Signed)
 Had phone conversation with Meghan daughter [name].  Earlier today I had a conversation with [name].  Several months back she had chosen to be DNR and hospice care.  She actually outlived that for 6 months.  But recently kidney functions are getting dramatically worse and long-term prognosis is very guarded.  [name] reaffirmed Meghan desire to be DNR.  Hospital team was informed.   In my discussion with [name] explained how creatinine is going up and urine production going down and that long-term prognosis is very guarded in depending on how things go over the next several days she could be getting close to Meghan earthly end.  It does appear she is going home tomorrow with hospice reinitiation.  We will be able to provide support outside the hospital.  Daughter understood regarding [name] having the wishes of DNR.

## 2023-09-10 NOTE — Progress Notes (Signed)
 PROGRESS NOTE     [name], is a 89 y.o. female, DOB - September 18, 1926, JYN:[id number]  Admit date - 09/08/2023   Admitting Physician Frankey Shown, DO  Outpatient Primary MD for the patient is Luking, Jonna Coup, MD  LOS - 1  Chief Complaint  Patient presents with   Abdominal Pain        Brief Narrative:  89 y.o. female with medical history significant of permanent atrial fibrillation with history of SSS,  status post pacemaker placement, HTN, CKD stage IV, chronic atrial fibrillation,HLD, COPD , prior history of stroke and chronic hypoxic respiratory failure usually uses 2 L of oxygen via nasal cannula at home presenting with increasing lower extremity edema and increasing dyspnea--admitted on 09/09/2023 with acute on chronic diastolic dysfunction CHF exacerbation and AKI on CKD with hyperkalemia and metabolic acidosis    -Assessment and Plan: -1)Acute on chronic diastolic CHF exacerbation--with severe pulmonary hypertension and valvular disease echo from 03/03/2021 with EF greater than 55% with moderate LVH,, with severe mitral stenosis, moderate tricuspid regurgitation, no aortic valve stenosis, severe pulmonary hypertension noted,  PASP 63, -- IV Lasix as ordered -Strict input and output monitoring and daily weights advised -Poor urine output noted despite iv diuresis, creatinine trending  -Dyspnea and hypoxia persist -on 09/10/23 Patient's PCP Dr. Lubertha South came to patient's bedside and spoke with patient, patient's husband and patient's daughter about overall poor prognosis -on 09/10/23 I had conference with Pt's son Denny Peon at bedside, daughters [name] and [name] are on the phone, RN [name] and pt's husband also at bedside -After conversation between patient, patient's husband, patient's children, patient's PCP and myself given overall poor prognosis patient has decided to go back on hospice (she was previously on hospice). -Anticipate discharge home on 09/11/2023 with hospice  services  2)Aki on CKD IV with hyperkalemia and metabolic acidosis---patient is oliguric -Creatinine on admission was up to  4.98 up from 3.25 on 08/30/2023 -Renal ultrasound without obstructive uropathy patient has severe atrophy of the right kidney and mild atrophy of the left kidney - Renally adjust medications, avoid nephrotoxic agents / dehydration  / hypotension -Creatinine is up to 5.25 with diuresis, urine output is poor -On 09/10/2023 patient and family requested transition back to hospice --Anticipate discharge home on 09/11/2023 with hospice services   3) Permanent afib--history of sick sinus syndrome, status post pacemaker placement -- PTA patient was not on Coumadin (given severe mitral stenosis she will need Coumadin over DOAC) -Continue Cardizem for rate control -- Patient and husband declined anticoagulation at this time -Anticipate discharge home on 09/11/2023 with hospice services   4)HTN--continue Cardizem, hydralazine -May use IV labetalol prn elevated BP   5)Constipation--imaging studies with large stool burden, husband reports constipation Laxatives ordered   6)Hyperkalemia with metabolic acidosis --- in the setting of AKI on CKD -Treated with  Lokelma, bicarb and calcium chloride -Lasix as ordered -Anticipate discharge home on 09/11/2023 with hospice services   Status is: Inpatient   Disposition: The patient is from: Home              Anticipated d/c is to: Home with hospice              Anticipated d/c date is: 1 day              Patient currently is not medically stable to d/c. Barriers: Not Clinically Stable-   Code Status :  -  Code Status: Do not attempt resuscitation (DNR) PRE-ARREST INTERVENTIONS DESIRED  Family Communication: Discussed with  patient, husband, son and daughters  DVT Prophylaxis  :   - SCDs   heparin injection 5,000 Units Start: 09/09/23 1400 SCDs Start: 09/09/23 0911 Place TED hose Start: 09/09/23 0911 SCDs Start: 09/09/23 0907 Place  TED hose Start: 09/09/23 0907   Lab Results  Component Value Date   PLT 168 09/08/2023    Inpatient Medications  Scheduled Meds:  Chlorhexidine Gluconate Cloth  6 each Topical Daily   diltiazem  180 mg Oral Daily   famotidine  20 mg Oral Daily   furosemide  60 mg Intravenous Q12H   heparin  5,000 Units Subcutaneous Q8H   hydrALAZINE  50 mg Oral TID   latanoprost  1 drop Both Eyes QHS   polyethylene glycol  17 g Oral BID   prednisoLONE acetate  1 drop Right Eye QID   senna-docusate  2 tablet Oral BID   sodium bicarbonate  1,300 mg Oral BID   sodium chloride flush  3 mL Intravenous Q12H   sodium chloride flush  3 mL Intravenous Q12H   sodium zirconium cyclosilicate  10 g Oral BID   ursodiol  300 mg Oral BID   Continuous Infusions: PRN Meds:.acetaminophen **OR** acetaminophen, albuterol, bisacodyl, labetalol, Muscle Rub, ondansetron **OR** ondansetron (ZOFRAN) IV, mouth rinse, sodium chloride flush, traZODone   Anti-infectives (From admission, onward)    None         Subjective: [name] today has no fevers, no emesis,  No chest pain,   - Poor urine output despite diuresis -Dyspnea and hypoxia persist on 09/10/23 Patient's PCP Dr. Lubertha South came to patient's bedside and spoke with patient, patient's husband and patient's daughter about overall poor prognosis -on 09/10/23 I had conference with Pt's son Denny Peon at bedside, daughters [name] and [name] are on the phone, RN [name] and pt's husband also at bedside -After conversation between patient, patient's husband, patient's children, patient's PCP and myself given overall poor prognosis patient has decided to go back on hospice (she was previously on hospice). -Anticipate discharge home on 09/11/2023 with hospice services    Objective: Vitals:   09/10/23 1008 09/10/23 1213 09/10/23 1540 09/10/23 1616  BP: (!) 176/48   (!) 157/51  Pulse:      Resp:      Temp:  97.6 F (36.4 C) 97.6 F (36.4 C)   TempSrc:  Oral  Oral   SpO2:      Weight:      Height:        Intake/Output Summary (Last 24 hours) at 09/10/2023 1653 Last data filed at 09/10/2023 0532 Gross per 24 hour  Intake 290 ml  Output 250 ml  Net 40 ml   Filed Weights   09/08/23 1815 09/09/23 0137 09/10/23 0532  Weight: 58 kg 63.8 kg 63.5 kg    Physical Exam  Gen:- Awake Alert, no acute distress HEENT:- [place].AT, No sclera icterus, left eye vision loss Nose -[place] 3L/min Neck-Supple Neck,No JVD,.  Lungs-diminished breath sounds with scattered bibasilar Rales CV- S1, S2 normal, irregular , pacemaker in situ Abd-  +ve B.Sounds, Abd Soft, No tenderness,    Extremity/Skin:-2 + edema, pedal pulses present  Psych-affect is appropriate, oriented x3 Neuro-generalized weakness and deconditioning , no new focal deficits, no tremors  Data Reviewed: I have personally reviewed following labs and imaging studies  CBC: Recent Labs  Lab 09/08/23 1838  WBC 5.7  HGB 9.7*  HCT 32.7*  MCV 96.5  PLT 168   Basic Metabolic Panel:  Recent Labs  Lab 09/08/23 1838 09/10/23 0555  NA 130* 132*  K 5.9* 5.2*  CL 97* 101  CO2 19* 20*  GLUCOSE 105* 86  BUN 83* 84*  CREATININE 4.98* 5.25*  CALCIUM 9.5 9.1  MG 3.1*  --   PHOS  --  5.4*   GFR: Estimated Creatinine Clearance: 5.7 mL/min (A) (by C-G formula based on SCr of 5.25 mg/dL (H)). Liver Function Tests: Recent Labs  Lab 09/08/23 1838 09/10/23 0555  AST 19  --   ALT 12  --   ALKPHOS 74  --   BILITOT 0.8  --   PROT 7.2  --   ALBUMIN 4.1 3.5   Recent Results (from the past 240 hours)  MRSA Next Gen by PCR, Nasal     Status: None   Collection Time: 09/09/23  1:10 AM   Specimen: Nasal Mucosa; Nasal Swab  Result Value Ref Range Status   MRSA by PCR Next Gen NOT DETECTED NOT DETECTED Final    Comment: (NOTE) The GeneXpert MRSA Assay (FDA approved for NASAL specimens only), is one component of a comprehensive MRSA colonization surveillance program. It is not intended to diagnose MRSA  infection nor to guide or monitor treatment for MRSA infections. Test performance is not FDA approved in patients less than 55 years old. Performed at [facility], [address], [place], [place] [address]     Radiology Studies: US RENAL Result Date: 09/10/2023 CLINICAL DATA:  Oliguria EXAM: RENAL / URINARY TRACT ULTRASOUND COMPLETE COMPARISON:  CT 09/08/2023. FINDINGS: Right Kidney: Renal measurements: 6.4 x 1.7 x 2.3 cm = volume: 102 mL. Small echogenic right kidney. No collecting system dilatation. There is an anechoic structure towards the upper pole measuring 21 mm consistent with a benign cyst. This was seen on prior CT. Left Kidney: Renal measurements: 10.4 x 4.9 x 6.5 cm = volume: 174 mL. Mild parenchymal atrophy. No collecting system dilatation or perinephric fluid. There is an anechoic structure in the left kidney measuring 3.7 cm with through transmission consistent with a benign cyst and also seen on previous CT. By CT this a. Mildly dense. Tiny other cystic foci are also seen. Bladder: Appears normal for degree of bladder distention. Other: Mild ascites seen in the pelvis. IMPRESSION: Severe atrophy of the right kidney. Mild left. No collecting system dilatation. Bilateral simple appearing renal cyst by ultrasound. Mild ascites.  Right pleural effusion. Please correlate with recent noncontrast CT. Electronically Signed   By: [name] Kays M.D.   On: 09/10/2023 12:30   ECHOCARDIOGRAM COMPLETE Result Date: 09/10/2023    ECHOCARDIOGRAM REPORT   Patient Name:   RHIANNON SASSAMAN Date of Exam: 09/10/2023 Medical Rec #:  [id number]         Height:       62.0 in Accession #:    [id number]        Weight:       140.0 lb Date of Birth:  07/14/1926         BSA:          1.643 m Patient Age:    89 years          BP:           155/51 mmHg Patient Gender: F                 HR:           61 bpm. [facility] Procedure: 2D Echo, Cardiac Doppler and  Color Doppler (Both Spectral and Color             Flow Doppler were utilized during procedure). Indications:    Abnormal ECG R94.31  History:        Patient has prior history of Echocardiogram examinations, most                 recent 03/03/2022. CHF, Pacemaker, TIA, Mitral Valve Disease,                 Arrythmias:Persistent atrial fibrillation; Risk                 Factors:Hypertension, Diabetes and Dyslipidemia. Hx of COVID-19,                 Chronic kidney disease, stage IV (severe) (HCC).  Sonographer:    Celesta Gentile RCS Referring Phys: [phone] [place] Ballo IMPRESSIONS  1. Left ventricular ejection fraction, by estimation, is 70 to 75%. Left ventricular ejection fraction by PLAX is 74 %. The left ventricle has hyperdynamic function. The left ventricle has no regional wall motion abnormalities. There is mild left ventricular hypertrophy. Left ventricular diastolic function could not be evaluated. There is the interventricular septum is flattened in systole and diastole, consistent with right ventricular pressure and volume overload.  2. Right ventricular systolic function is moderately reduced. The right ventricular size is mildly enlarged. There is severely elevated pulmonary artery systolic pressure. The estimated right ventricular systolic pressure is 75.2 mmHg.  3. Left atrial size was severely dilated.  4. Right atrial size was severely dilated.  5. Moderate pleural effusion in the left lateral region.  6. The mitral valve is degenerative. Mild mitral valve regurgitation. Severe mitral stenosis. The mean mitral valve gradient is 11.0 mmHg with average heart rate of 78 bpm. Severe mitral annular calcification.  7. The tricuspid valve is abnormal. Tricuspid valve regurgitation is moderate.  8. The aortic valve is tricuspid. Aortic valve regurgitation is not visualized. Aortic valve sclerosis/calcification is present, without any evidence of aortic stenosis.  9. The inferior vena cava is dilated in size with <50% respiratory variability, suggesting right  atrial pressure of 15 mmHg. Comparison(s): Changes from prior study are noted. 03/03/2022: LVEF >75%, RVSP 62.5 mmHg, normal RV systolic function, severe mitral stenosis - MG 13 mmHg. FINDINGS  Left Ventricle: Left ventricular ejection fraction, by estimation, is 70 to 75%. Left ventricular ejection fraction by PLAX is 74 %. The left ventricle has hyperdynamic function. The left ventricle has no regional wall motion abnormalities. The left ventricular internal cavity size was normal in size. There is mild left ventricular hypertrophy. The interventricular septum is flattened in systole and diastole, consistent with right ventricular pressure and volume overload. Left ventricular diastolic function could not be evaluated due to paced rhythm. Left ventricular diastolic function could not be evaluated. Right Ventricle: The right ventricular size is mildly enlarged. No increase in right ventricular wall thickness. Right ventricular systolic function is moderately reduced. There is severely elevated pulmonary artery systolic pressure. The tricuspid regurgitant velocity is 3.88 m/s, and with an assumed right atrial pressure of 15 mmHg, the estimated right ventricular systolic pressure is 75.2 mmHg. Left Atrium: Left atrial size was severely dilated. Right Atrium: Right atrial size was severely dilated. Pericardium: Trivial pericardial effusion is present. Mitral Valve: The mitral valve is degenerative in appearance. Severe mitral annular calcification. Mild mitral valve regurgitation. Severe mitral valve stenosis. MV peak gradient, 29.8 mmHg. The mean mitral valve gradient is 11.0 mmHg with  average heart rate of 78 bpm. Tricuspid Valve: The tricuspid valve is abnormal. Tricuspid valve regurgitation is moderate. Aortic Valve: The aortic valve is tricuspid. Aortic valve regurgitation is not visualized. Aortic valve sclerosis/calcification is present, without any evidence of aortic stenosis. Aortic valve mean gradient  measures 9.0 mmHg. Aortic valve peak gradient measures 16.2 mmHg. Aortic valve area, by VTI measures 0.99 cm. Pulmonic Valve: The pulmonic valve was not well visualized. Pulmonic valve regurgitation is not visualized. Aorta: The aortic root and ascending aorta are structurally normal, with no evidence of dilitation. Venous: The inferior vena cava is dilated in size with less than 50% respiratory variability, suggesting right atrial pressure of 15 mmHg. IAS/Shunts: No atrial level shunt detected by color flow Doppler. Additional Comments: A device lead is visualized. There is a moderate pleural effusion in the left lateral region.  LEFT VENTRICLE PLAX 2D LV EF:         Left            Diastology                ventricular     LV e' medial:    3.83 cm/s                ejection        LV E/e' medial:  56.4                fraction by     LV e' lateral:   3.98 cm/s                PLAX is 74      LV E/e' lateral: 54.3                %. LVIDd:         3.80 cm LVIDs:         2.20 cm LV PW:         1.90 cm LV IVS:        1.00 cm LVOT diam:     1.60 cm LV SV:         47 LV SV Index:   28 LVOT Area:     2.01 cm  RIGHT VENTRICLE RV S prime:     7.80 cm/s TAPSE (M-mode): 1.3 cm LEFT ATRIUM              Index        RIGHT ATRIUM           Index LA diam:        4.10 cm  2.50 cm/m   RA Area:     39.10 cm LA Vol (A2C):   117.0 ml 71.22 ml/m  RA Volume:   168.00 ml 102.27 ml/m LA Vol (A4C):   129.0 ml 78.53 ml/m LA Biplane Vol: 131.0 ml 79.74 ml/m  AORTIC VALVE AV Area (Vmax):    0.89 cm AV Area (Vmean):   0.96 cm AV Area (VTI):     0.99 cm AV Vmax:           201.00 cm/s AV Vmean:          144.000 cm/s AV VTI:            0.474 m AV Peak Grad:      16.2 mmHg AV Mean Grad:      9.0 mmHg LVOT Vmax:         89.03 cm/s LVOT Vmean:        68.467 cm/s  LVOT VTI:          0.232 m LVOT/AV VTI ratio: 0.49  AORTA Ao Root diam: 3.00 cm MITRAL VALVE                TRICUSPID VALVE MV Area (PHT): 1.48 cm     TR Peak grad:   60.2 mmHg MV  Area VTI:   0.48 cm     TR Vmax:        388.00 cm/s MV Peak grad:  29.8 mmHg MV Mean grad:  11.0 mmHg    SHUNTS MV Vmax:       2.73 m/s     Systemic VTI:  0.23 m MV Vmean:      149.0 cm/s   Systemic Diam: 1.60 cm MV Decel Time: 514 msec MV E velocity: 216.00 cm/s Zoila Shutter MD Electronically signed by Zoila Shutter MD Signature Date/Time: 09/10/2023/11:51:03 AM    Final    CT ABDOMEN PELVIS WO CONTRAST Result Date: 09/08/2023 CLINICAL DATA:  Acute nonlocalized abdominal pain. Constipation for 3 days in lower abdominal pain. EXAM: CT ABDOMEN AND PELVIS WITHOUT CONTRAST TECHNIQUE: Multidetector CT imaging of the abdomen and pelvis was performed following the standard protocol without IV contrast. RADIATION DOSE REDUCTION: This exam was performed according to the departmental dose-optimization program which includes automated exposure control, adjustment of the mA and/or kV according to patient size and/or use of iterative reconstruction technique. COMPARISON:  Same day abdominal radiograph and CT abdomen pelvis 03/09/2023 FINDINGS: Lower chest: Cardiomegaly. Large right and small left pleural effusions and compressive atelectasis. Hepatobiliary: Gallbladder wall thickening and pericholecystic fluid. Unremarkable liver. No biliary dilation or radiopaque stone. Pancreas: Unremarkable. Spleen: Unremarkable. Adrenals/Urinary Tract: Normal adrenal glands. Atrophic right kidney with parenchymal calcification. No urinary calculi or hydronephrosis. Unremarkable bladder. Stomach/Bowel: Normal caliber large and small bowel without bowel wall thickening. Colonic diverticulosis without diverticulitis. Stomach is within normal limits. Vascular/Lymphatic: Advanced arterial atherosclerotic calcification of the aorta and its mesenteric, renal, and iliac artery branches. No lymphadenopathy. Reproductive: Calcified uterine fibroids.  No adnexal mass. Other: Small volume free fluid in the pelvis. Mesenteric edema. No free  intraperitoneal air. Marked body wall anasarca. Musculoskeletal: No acute fracture. IMPRESSION: 1. Cardiomegaly with signs of volume overload including body wall anasarca, large right and small left pleural effusions, mesenteric edema, and small volume free fluid in the pelvis. 2. Gallbladder wall thickening and pericholecystic fluid. Favored related to volume overload. If there is concern for acute cholecystitis, consider further evaluation with right upper quadrant ultrasound. 3. Aortic Atherosclerosis (ICD10-I70.0). Electronically Signed   By: Minerva Fester M.D.   On: 09/08/2023 23:02   DG Chest Port 1 View Result Date: 09/08/2023 CLINICAL DATA:  Dyspnea EXAM: PORTABLE CHEST 1 VIEW COMPARISON:  03/08/2023 FINDINGS: Single frontal view of the chest demonstrates stable dual lead pacemaker. Cardiac silhouette is enlarged, with dense calcification of the mitral annulus. Increased bibasilar veiling opacities, right greater than left, consistent with pleural effusions and bibasilar consolidation. No pneumothorax. Diffuse atherosclerosis. No acute bony abnormalities. IMPRESSION: 1. Progressive bibasilar consolidation and pleural effusions. 2. Stable enlarged cardiac silhouette, with dense mitral annular calcifications. Electronically Signed   By: Sharlet Salina M.D.   On: 09/08/2023 21:59   DG Abdomen 1 View Result Date: 09/08/2023 CLINICAL DATA:  Constipation for 3 days and lower abdominal pain EXAM: ABDOMEN - 1 VIEW COMPARISON:  CT and ultrasound 03/09/2023 FINDINGS: Nonobstructed bowel-gas pattern. Large stool burden in the right colon. 8 mm stone in the right kidney. Calcified uterine  fibroids. IMPRESSION: Nonobstructed bowel-gas pattern. Large stool burden in the right colon. Electronically Signed   By: Minerva Fester M.D.   On: 09/08/2023 19:05     Scheduled Meds:  Chlorhexidine Gluconate Cloth  6 each Topical Daily   diltiazem  180 mg Oral Daily   famotidine  20 mg Oral Daily   furosemide  60 mg  Intravenous Q12H   heparin  5,000 Units Subcutaneous Q8H   hydrALAZINE  50 mg Oral TID   latanoprost  1 drop Both Eyes QHS   polyethylene glycol  17 g Oral BID   prednisoLONE acetate  1 drop Right Eye QID   senna-docusate  2 tablet Oral BID   sodium bicarbonate  1,300 mg Oral BID   sodium chloride flush  3 mL Intravenous Q12H   sodium chloride flush  3 mL Intravenous Q12H   sodium zirconium cyclosilicate  10 g Oral BID   ursodiol  300 mg Oral BID   Continuous Infusions:   LOS: 1 day    Shon Hale M.D on 09/10/2023 at 4:53 PM  Go to [email or web address] - for contact info  [facility] - Office  [phone]  If 7PM-7AM, please contact night-coverage [email or web address] 09/10/2023, 4:53 PM

## 2023-09-10 NOTE — Plan of Care (Signed)

## 2023-09-10 NOTE — Progress Notes (Signed)
   09/10/23 1826  Discharge Planning  Type of Residence  (Home with Hospice)  Type of Home Care Services Hospice  Home Care Agency (if known) [facility]  Support Systems Children;Church/faith community;Friends/neighbors;Spouse/significant other  Does the patient have any problems obtaining your medications? No  Does the patient have difficulty doing errands alone such as visiting a doctor's office or shopping? Y  Family/patient expects to be discharged to: Private residence  Once discharged, how will the patient get to their discharge location? Family/Friend - Photographer  Once discharged, how will the patient get to their follow-up appointment? Family/Friend - Partnered Transport   CM called Kathy at 1:18pm to notify Gertie Exon plan to be discharge home on 09/10/2023. LOC will continue to monitor patient advancement through interdisciplinary progression rounds

## 2023-09-10 NOTE — Progress Notes (Addendum)
  Interdisciplinary Goals of Care Family Meeting   Date carried out: 09/10/2023  Location of the meeting: Bedside  Member's involved: Physician, Bedside Registered Nurse, and Family Member or next of kin Pt's son [name] at bedside, daughters [name] and [name] are on the phone, RN [name] and pt's husband also at bedside [employer] of [employer] or acting medical decision maker: Husband and kids    - -on 09/10/23 Patient's PCP Dr. Lubertha South came to patient's bedside and spoke with patient, patient's husband and patient's daughter about overall poor prognosis -on 09/10/23 I had conference with Pt's son [name] at bedside, daughters [name] and [name] are on the phone, RN [name] and pt's husband also at bedside -After conversation between patient, patient's husband, patient's children, patient's PCP and myself given overall poor prognosis patient has decided to go back on hospice (she was previously on hospice). -Anticipate discharge home on 09/11/2023 with hospice services  Discussion: We discussed goals of care for [name] .    Code status:   Code Status: Do not attempt resuscitation (DNR) PRE-ARREST INTERVENTIONS DESIRED   Disposition: Home with Hospice--- request DNR status -Anticipate discharge home with hospice on 09/11/2023  Time spent for the meeting: 37 mins   Shon Hale, MD  09/10/2023, 4:42 PM

## 2023-09-11 DIAGNOSIS — Z950 Presence of cardiac pacemaker: Secondary | ICD-10-CM | POA: Diagnosis not present

## 2023-09-11 DIAGNOSIS — E118 Type 2 diabetes mellitus with unspecified complications: Secondary | ICD-10-CM | POA: Diagnosis not present

## 2023-09-11 DIAGNOSIS — I272 Pulmonary hypertension, unspecified: Secondary | ICD-10-CM | POA: Diagnosis present

## 2023-09-11 DIAGNOSIS — I5033 Acute on chronic diastolic (congestive) heart failure: Secondary | ICD-10-CM | POA: Diagnosis not present

## 2023-09-11 DIAGNOSIS — Z515 Encounter for palliative care: Secondary | ICD-10-CM

## 2023-09-11 DIAGNOSIS — N185 Chronic kidney disease, stage 5: Secondary | ICD-10-CM | POA: Diagnosis present

## 2023-09-11 DIAGNOSIS — I4819 Other persistent atrial fibrillation: Secondary | ICD-10-CM | POA: Diagnosis not present

## 2023-09-11 MED ORDER — ACETAMINOPHEN 325 MG PO TABS: 650.0000 mg | ORAL_TABLET | Freq: Four times a day (QID) | ORAL | Status: DC | PRN

## 2023-09-11 MED ORDER — LOKELMA 10 G PO PACK: 10.0000 g | PACK | Freq: Every day | ORAL | 0 refills | Status: DC

## 2023-09-11 MED ORDER — MORPHINE SULFATE 20 MG/5ML PO SOLN: 5.0000 mg | ORAL | 0 refills | Status: DC | PRN

## 2023-09-11 MED ORDER — HYDRALAZINE HCL 50 MG PO TABS: 50.0000 mg | ORAL_TABLET | Freq: Three times a day (TID) | ORAL | 3 refills | Status: DC

## 2023-09-11 MED ORDER — SODIUM BICARBONATE 650 MG PO TABS: 650.0000 mg | ORAL_TABLET | Freq: Every day | ORAL | 3 refills | Status: DC

## 2023-09-11 MED ORDER — ALBUTEROL SULFATE HFA 108 (90 BASE) MCG/ACT IN AERS: INHALATION_SPRAY | RESPIRATORY_TRACT | 3 refills | Status: DC

## 2023-09-11 MED ORDER — FUROSEMIDE 80 MG PO TABS: 80.0000 mg | ORAL_TABLET | Freq: Two times a day (BID) | ORAL | 3 refills | Status: DC

## 2023-09-11 NOTE — Discharge Summary (Addendum)
 [name], is a 89 y.o. female  DOB 1926/08/16  MRN [id number].  Admission date:  09/08/2023  Admitting Physician  Frankey Shown, DO  Discharge Date:  09/11/2023   Primary MD  Babs Sciara, MD  Recommendations for primary care physician for things to follow:  1)Very Low-salt diet advised---Less than 2 gm of Sodium per day advised----ok to use Mrs DASH salt substitute instead of Salt 2)Weigh yourself daily, call if you gain more than 3 pounds in 1 day or more than 5 pounds in 1 week as your diuretic medications may need to be adjusted 3)Limit your Fluid  intake to no more than 50 ounces (1.5 Liters) per day 4) continue oxygen at 2 L via nasal cannula continuously 5)Please reach out to your Hospice Team for ongoing support 6)Avoid ibuprofen/Advil/Aleve/Motrin/Goody Powders/Naproxen/BC powders/Meloxicam/Diclofenac/Indomethacin and other Nonsteroidal anti-inflammatory medications as these will make you more likely to bleed and can cause stomach ulcers, can also cause Kidney problems.   Admission Diagnosis  Hyperkalemia [E87.5] Hypermagnesemia [E83.41] Anasarca [R60.1] Acute exacerbation of CHF (congestive heart failure) (HCC) [I50.9] Acute kidney injury (HCC) [N17.9] Congestive heart failure, unspecified HF chronicity, unspecified heart failure type (HCC) [I50.9]   Discharge Diagnosis  Hyperkalemia [E87.5] Hypermagnesemia [E83.41] Anasarca [R60.1] Acute exacerbation of CHF (congestive heart failure) (HCC) [I50.9] Acute kidney injury (HCC) [N17.9] Congestive heart failure, unspecified HF chronicity, unspecified heart failure type (HCC) [I50.9]    Principal Problem:   Acute exacerbation of CHF (congestive heart failure) (HCC) Active Problems:   CKD (chronic kidney disease), stage V (HCC)   Hospice care patient   Severe Pulmonary hypertension/PASP 73 mmhg   HPpEF/Acute on chronic congestive heart  failure with left ventricular diastolic dysfunction-EF > 70 %   Essential hypertension   PPM-St.Jude   Controlled type 2 diabetes mellitus with complication, without long-term current use of insulin (HCC)   Pleural effusion due to CHF (congestive heart failure) (HCC)   Chronic kidney disease, stage IV (severe) (HCC)   Persistent atrial fibrillation (HCC)      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   Ultrasound 12/29/2018 demonstrates cyst no further work-up recommended by criteria   Blood transfusion    Cerebrovascular accident [facility]) 2006   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 ;magnetic MRI->  multiple cerebral infarctions; Rx-ASA but subsequently changed to coumadin  when found to have rheumatic mitral valve disease carotid duplex mild plaque in 08/2008   Chronic bronchitis    Chronic renal insufficiency    baseline creatine 1.4; 1.04 in 03/2010   DJD (degenerative joint disease)    hands, knees   Gout    Hyperlipidemia    Hypertension    heart disease diastolic dysfunction ; EF-80%; mild pulmonary edema in 2006;responded to diurectics ; LVH   Kidney stones    Mitral valve disease    Severe  mitral annular calcification; mild to moderate stenosis-not clearly rheumatic   Nephrolithiasis    Pacemaker    Retinal hemorrhage    Seizure disorder (HCC)    Shortness of breath    only with exertion   Stroke (HCC)    Tachycardia-bradycardia syndrome (HCC)    with pauses and syncope;PPM implantation 10/2008,negative stress nuclear study 03/2005   Vertigo     Past Surgical History:  Procedure Laterality Date   CATARACT EXTRACTION W/PHACO  03/29/2011   Procedure: CATARACT EXTRACTION PHACO AND INTRAOCULAR LENS PLACEMENT (IOC);  Surgeon: Gemma Payor;  Location: [facility];  Service: Ophthalmology;  Laterality: Right;  CDE 12.62   INSERT  / REPLACE / REMOVE PACEMAKER  11/2008   St.Jude DUAL chamber pacemaker 11/06/2008   MEMBRANE PEEL Right 02/12/2014   Procedure: MEMBRANE PEEL;  Surgeon: Sherrie George, MD;  Location: [facility] OR;  Service: Ophthalmology;  Laterality: Right;   PARS PLANA VITRECTOMY Right 02/12/2014   Procedure: PARS PLANA VITRECTOMY WITH 25 GAUGE;  Surgeon: Sherrie George, MD;  Location: [facility] OR;  Service: Ophthalmology;  Laterality: Right;   PPM GENERATOR CHANGEOUT N/A 03/17/2020   Procedure: PPM GENERATOR CHANGEOUT;  Surgeon: Duke Salvia, MD;  Location: [facility] INVASIVE CV LAB;  Service: Cardiovascular;  Laterality: N/A;   TOTAL KNEE ARTHROPLASTY  04/2009   Right     HPI  from the history and physical done on the day of admission:   HPI: [name] is a 89 y.o. female with medical history significant of permanent atrial fibrillation with history of SSS,  status post pacemaker placement, HTN, CKD stage IV, chronic atrial fibrillation,HLD, COPD , prior history of stroke and chronic hypoxic respiratory failure usually uses 2 L of oxygen via nasal cannula at home presenting with increasing lower extremity edema and increasing dyspnea. -Additional history obtained from patient's husband at bedside  -Patient also reports abdominal discomfort and constipation concerns, No fever  Or chills  -No nausea no vomiting -No urinary concerns -CT abdomen pelvis and chest x-ray consistent with CHF with pleural effusions and large stool burden -BNP very elevated at 2,213 Troponin 55 >> 55, EKG consistent with A-fib -Hemoglobin 9.7 which is not far from baseline, WBC 5.7 platelets 168 -- UA not consistent with UTI -Lipase is not elevated -Sodium is 130, potassium is 5.9 -Creatinine 4.98 up from 3.25 on 08/30/2023 -LFTs are not elevated    Review of Systems: As mentioned in the history of present illness. All other systems reviewed and are negative.    Hospital Course:   Brief Narrative:  89 y.o. female with medical history  significant of permanent atrial fibrillation with history of SSS,  status post pacemaker placement, HTN, CKD stage IV, chronic atrial fibrillation,HLD, COPD , prior history of stroke and chronic hypoxic respiratory failure usually uses 2 L of oxygen via nasal cannula at home presenting with increasing lower extremity edema and increasing dyspnea--admitted on 09/09/2023 with acute on chronic diastolic dysfunction CHF exacerbation and AKI on CKD with hyperkalemia and metabolic acidosis  Problem  Ckd (Chronic Kidney Disease), Stage V (Hcc)  Hospice Care Patient  Severe Pulmonary hypertension/PASP 73 mmhg  HPpEF/Acute on chronic congestive heart failure with left ventricular diastolic dysfunction-EF > 70 %  Pleural Effusion Due to Chf (Congestive Heart Failure) (Hcc)  Hfref (Heart Failure With Reduced Ejection Fraction) (Hcc) (Resolved)    -Assessment and Plan: -1)Acute on chronic diastolic CHF exacerbation--with severe pulmonary hypertension and valvular heart disease -and right-sided pleural effusion. echo from 03/03/2021  with EF greater than 55% with moderate LVH,, with severe mitral stenosis, moderate tricuspid regurgitation, no aortic valve stenosis, severe pulmonary hypertension noted,  PASP 63, --Repeat echo on 09/10/2023 with EF of 70 to 75%, mild LVH evidence of right ventricular volume pressure and volume overload, severe pulmonary artery hypertension PASP-now up to 75.2 mmhg from 63 back in April 2022), severe bilateral atrial dilatation, severe mitral stenosis with mitral gradient of 13 mmHg, moderate tricuspid regurgitation, no aortic stenosis -No significant improvement or significant diuresis despite IV Lasix (GFR is only 7) -Poor urine output noted despite iv diuresis, creatinine trending up -Dyspnea and hypoxia persist -on 09/10/23 Patient's PCP Dr. Lubertha South came to patient's bedside and spoke with patient, patient's husband and patient's daughter about overall poor prognosis -on 09/10/23  I had conference with Pt's son Denny Peon at bedside, daughters [name] and [name] are on the phone, RN [name] and pt's husband also at bedside -After conversation between patient, patient's husband, patient's children, patient's PCP and myself given overall poor prognosis patient has decided to go back on hospice (she was previously on hospice). -Patient and family request discharge home on 09/11/2023 with hospice services --Okay to use morphine sulfate as needed for dyspnea and air hunger   2)Aki on CKD IV with hyperkalemia and metabolic acidosis---patient is oliguric -Creatinine on admission was up to  4.98 up from 3.25 on 08/30/2023 -Renal ultrasound without obstructive uropathy patient has severe atrophy of the right kidney and mild atrophy of the left kidney - Renally adjust medications, avoid nephrotoxic agents / dehydration  / hypotension -Creatinine is up to 5.25 with diuresis, urine output is poor --Patient and family request discharge home on 09/11/2023 with hospice services   3) Permanent afib--history of sick sinus syndrome, status post pacemaker placement -- PTA patient was not on Coumadin (given severe mitral stenosis she will need Coumadin over DOAC) -Continue Cardizem for rate control -- Patient and husband declined anticoagulation at this time -Patient and family request discharge home on 09/11/2023 with hospice services   4)HTN--- BP medications and diuretics adjusted   5)Constipation--imaging studies with large stool burden, husband reports constipation Laxatives advised  6)Hyperkalemia with metabolic acidosis --- in the setting of AKI on CKD -Treated with  Lokelma, bicarb and calcium chloride and lasix -Okay to discharge home with bicarb and Lokelma as well as Lasix -Patient and family request discharge home on 09/11/2023 with hospice services   Disposition: The patient is from: Home              Anticipated d/c is to: Home with hospice  Discharge Condition: stable  Follow  UP--hospice team  Diet and Activity recommendation:  As advised  Discharge Instructions    Discharge Instructions     Call MD for:  difficulty breathing, headache or visual disturbances   Complete by: As directed    Call MD for:  persistant dizziness or light-headedness   Complete by: As directed    Call MD for:  temperature >100.4   Complete by: As directed    Diet - low sodium heart healthy   Complete by: As directed    Discharge instructions   Complete by: As directed    1)Very Low-salt diet advised---Less than 2 gm of Sodium per day advised----ok to use Mrs DASH salt substitute instead of Salt 2)Weigh yourself daily, call if you gain more than 3 pounds in 1 day or more than 5 pounds in 1 week as your diuretic medications may need to be adjusted  3)Limit your Fluid  intake to no more than 50 ounces (1.5 Liters) per day 4) continue oxygen at 2 L via nasal cannula continuously 5)Please reach out to your Hospice Team for ongoing support 6)Avoid ibuprofen/Advil/Aleve/Motrin/Goody Powders/Naproxen/BC powders/Meloxicam/Diclofenac/Indomethacin and other Nonsteroidal anti-inflammatory medications as these will make you more likely to bleed and can cause stomach ulcers, can also cause Kidney problems.   Increase activity slowly   Complete by: As directed        Discharge Medications     Allergies as of 09/11/2023       Reactions   Tramadol    Drowsiness        Medication List     STOP taking these medications    diltiazem 180 MG 24 hr capsule Commonly known as: Cardizem CD   famotidine 20 MG tablet Commonly known as: PEPCID       TAKE these medications    acetaminophen 325 MG tablet Commonly known as: TYLENOL Take 2 tablets (650 mg total) by mouth every 6 (six) hours as needed for mild pain (pain score 1-3) (or Fever >/= 101).   albuterol 108 (90 Base) MCG/ACT inhaler Commonly known as: VENTOLIN HFA TAKE 2 PUFFS EVERY 6 HOURS AS NEEDED   furosemide 80 MG  tablet Commonly known as: Lasix Take 1 tablet (80 mg total) by mouth 2 (two) times daily.   guaiFENesin-dextromethorphan 100-10 MG/5ML syrup Commonly known as: ROBITUSSIN DM Take 10 mLs by mouth every 4 (four) hours as needed for cough.   hydrALAZINE 50 MG tablet Commonly known as: APRESOLINE Take 1 tablet (50 mg total) by mouth 3 (three) times daily. What changed:  medication strength how much to take how to take this when to take this additional instructions   Lokelma 10 g Pack packet Generic drug: sodium zirconium cyclosilicate Take 10 g by mouth daily.   morphine 20 MG/5ML solution Take 1.3 mLs (5.2 mg total) by mouth every 4 (four) hours as needed for pain.   ondansetron 4 MG disintegrating tablet Commonly known as: ZOFRAN-ODT Take 1 tablet (4 mg total) by mouth every 8 (eight) hours as needed.   OXYGEN Inhale 2 L into the lungs continuous.   pantoprazole 40 MG tablet Commonly known as: PROTONIX Take 40 mg by mouth daily.   prednisoLONE acetate 1 % ophthalmic suspension Commonly known as: PRED FORTE Place 1 drop into the right eye daily as needed (Inflammation).   Senna-Plus 8.6-50 MG tablet Generic drug: senna-docusate Take by mouth 2 (two) times daily.   sodium bicarbonate 650 MG tablet Take 1 tablet (650 mg total) by mouth daily.   torsemide 20 MG tablet Commonly known as: DEMADEX TAKE 3 TABLETS BY MOUTH DAILY What changed:  how much to take how to take this when to take this additional instructions   Travoprost (BAK Free) 0.004 % Soln ophthalmic solution Commonly known as: TRAVATAN Place 1 drop into both eyes at bedtime.   ursodiol 300 MG capsule Commonly known as: ACTIGALL Take 1 capsule (300 mg total) by mouth 2 (two) times daily.       Major procedures and Radiology Reports - PLEASE review detailed and final reports for all details, in brief -   US RENAL Result Date: 09/10/2023 CLINICAL DATA:  Oliguria EXAM: RENAL / URINARY TRACT  ULTRASOUND COMPLETE COMPARISON:  CT 09/08/2023. FINDINGS: Right Kidney: Renal measurements: 6.4 x 1.7 x 2.3 cm = volume: 102 mL. Small echogenic right kidney. No collecting system dilatation. There is an anechoic structure towards the  upper pole measuring 21 mm consistent with a benign cyst. This was seen on prior CT. Left Kidney: Renal measurements: 10.4 x 4.9 x 6.5 cm = volume: 174 mL. Mild parenchymal atrophy. No collecting system dilatation or perinephric fluid. There is an anechoic structure in the left kidney measuring 3.7 cm with through transmission consistent with a benign cyst and also seen on previous CT. By CT this a. Mildly dense. Tiny other cystic foci are also seen. Bladder: Appears normal for degree of bladder distention. Other: Mild ascites seen in the pelvis. IMPRESSION: Severe atrophy of the right kidney. Mild left. No collecting system dilatation. Bilateral simple appearing renal cyst by ultrasound. Mild ascites.  Right pleural effusion. Please correlate with recent noncontrast CT. Electronically Signed   By: [name] Kays M.D.   On: 09/10/2023 12:30   ECHOCARDIOGRAM COMPLETE Result Date: 09/10/2023    ECHOCARDIOGRAM REPORT   Patient Name:   CHAMPAYNE KOCIAN Date of Exam: 09/10/2023 Medical Rec #:  [id number]         Height:       62.0 in Accession #:    [id number]        Weight:       140.0 lb Date of Birth:  11-24-1926         BSA:          1.643 m Patient Age:    89 years          BP:           155/51 mmHg Patient Gender: F                 HR:           61 bpm. [facility] Procedure: 2D Echo, Cardiac Doppler and Color Doppler (Both Spectral and Color            Flow Doppler were utilized during procedure). Indications:    Abnormal ECG R94.31  History:        Patient has prior history of Echocardiogram examinations, most                 recent 03/03/2022. CHF, Pacemaker, TIA, Mitral Valve Disease,                 Arrythmias:Persistent atrial fibrillation; Risk                  Factors:Hypertension, Diabetes and Dyslipidemia. Hx of COVID-19,                 Chronic kidney disease, stage IV (severe) (HCC).  Sonographer:    Celesta Gentile RCS Referring Phys: [id number] Selenne Coggin IMPRESSIONS  1. Left ventricular ejection fraction, by estimation, is 70 to 75%. Left ventricular ejection fraction by PLAX is 74 %. The left ventricle has hyperdynamic function. The left ventricle has no regional wall motion abnormalities. There is mild left ventricular hypertrophy. Left ventricular diastolic function could not be evaluated. There is the interventricular septum is flattened in systole and diastole, consistent with right ventricular pressure and volume overload.  2. Right ventricular systolic function is moderately reduced. The right ventricular size is mildly enlarged. There is severely elevated pulmonary artery systolic pressure. The estimated right ventricular systolic pressure is 75.2 mmHg.  3. Left atrial size was severely dilated.  4. Right atrial size was severely dilated.  5. Moderate pleural effusion in the left lateral region.  6. The mitral valve is degenerative. Mild mitral valve regurgitation. Severe mitral stenosis.  The mean mitral valve gradient is 11.0 mmHg with average heart rate of 78 bpm. Severe mitral annular calcification.  7. The tricuspid valve is abnormal. Tricuspid valve regurgitation is moderate.  8. The aortic valve is tricuspid. Aortic valve regurgitation is not visualized. Aortic valve sclerosis/calcification is present, without any evidence of aortic stenosis.  9. The inferior vena cava is dilated in size with <50% respiratory variability, suggesting right atrial pressure of 15 mmHg. Comparison(s): Changes from prior study are noted. 03/03/2022: LVEF >75%, RVSP 62.5 mmHg, normal RV systolic function, severe mitral stenosis - MG 13 mmHg. FINDINGS  Left Ventricle: Left ventricular ejection fraction, by estimation, is 70 to 75%. Left ventricular ejection fraction by PLAX is  74 %. The left ventricle has hyperdynamic function. The left ventricle has no regional wall motion abnormalities. The left ventricular internal cavity size was normal in size. There is mild left ventricular hypertrophy. The interventricular septum is flattened in systole and diastole, consistent with right ventricular pressure and volume overload. Left ventricular diastolic function could not be evaluated due to paced rhythm. Left ventricular diastolic function could not be evaluated. Right Ventricle: The right ventricular size is mildly enlarged. No increase in right ventricular wall thickness. Right ventricular systolic function is moderately reduced. There is severely elevated pulmonary artery systolic pressure. The tricuspid regurgitant velocity is 3.88 m/s, and with an assumed right atrial pressure of 15 mmHg, the estimated right ventricular systolic pressure is 75.2 mmHg. Left Atrium: Left atrial size was severely dilated. Right Atrium: Right atrial size was severely dilated. Pericardium: Trivial pericardial effusion is present. Mitral Valve: The mitral valve is degenerative in appearance. Severe mitral annular calcification. Mild mitral valve regurgitation. Severe mitral valve stenosis. MV peak gradient, 29.8 mmHg. The mean mitral valve gradient is 11.0 mmHg with average heart rate of 78 bpm. Tricuspid Valve: The tricuspid valve is abnormal. Tricuspid valve regurgitation is moderate. Aortic Valve: The aortic valve is tricuspid. Aortic valve regurgitation is not visualized. Aortic valve sclerosis/calcification is present, without any evidence of aortic stenosis. Aortic valve mean gradient measures 9.0 mmHg. Aortic valve peak gradient measures 16.2 mmHg. Aortic valve area, by VTI measures 0.99 cm. Pulmonic Valve: The pulmonic valve was not well visualized. Pulmonic valve regurgitation is not visualized. Aorta: The aortic root and ascending aorta are structurally normal, with no evidence of dilitation. Venous:  The inferior vena cava is dilated in size with less than 50% respiratory variability, suggesting right atrial pressure of 15 mmHg. IAS/Shunts: No atrial level shunt detected by color flow Doppler. Additional Comments: A device lead is visualized. There is a moderate pleural effusion in the left lateral region.  LEFT VENTRICLE PLAX 2D LV EF:         Left            Diastology                ventricular     LV e' medial:    3.83 cm/s                ejection        LV E/e' medial:  56.4                fraction by     LV e' lateral:   3.98 cm/s                PLAX is 74      LV E/e' lateral: 54.3                %.  LVIDd:         3.80 cm LVIDs:         2.20 cm LV PW:         1.90 cm LV IVS:        1.00 cm LVOT diam:     1.60 cm LV SV:         47 LV SV Index:   28 LVOT Area:     2.01 cm  RIGHT VENTRICLE RV S prime:     7.80 cm/s TAPSE (M-mode): 1.3 cm LEFT ATRIUM              Index        RIGHT ATRIUM           Index LA diam:        4.10 cm  2.50 cm/m   RA Area:     39.10 cm LA Vol (A2C):   117.0 ml 71.22 ml/m  RA Volume:   168.00 ml 102.27 ml/m LA Vol (A4C):   129.0 ml 78.53 ml/m LA Biplane Vol: 131.0 ml 79.74 ml/m  AORTIC VALVE AV Area (Vmax):    0.89 cm AV Area (Vmean):   0.96 cm AV Area (VTI):     0.99 cm AV Vmax:           201.00 cm/s AV Vmean:          144.000 cm/s AV VTI:            0.474 m AV Peak Grad:      16.2 mmHg AV Mean Grad:      9.0 mmHg LVOT Vmax:         89.03 cm/s LVOT Vmean:        68.467 cm/s LVOT VTI:          0.232 m LVOT/AV VTI ratio: 0.49  AORTA Ao Root diam: 3.00 cm MITRAL VALVE                TRICUSPID VALVE MV Area (PHT): 1.48 cm     TR Peak grad:   60.2 mmHg MV Area VTI:   0.48 cm     TR Vmax:        388.00 cm/s MV Peak grad:  29.8 mmHg MV Mean grad:  11.0 mmHg    SHUNTS MV Vmax:       2.73 m/s     Systemic VTI:  0.23 m MV Vmean:      149.0 cm/s   Systemic Diam: 1.60 cm MV Decel Time: 514 msec MV E velocity: 216.00 cm/s Zoila Shutter MD Electronically signed by Zoila Shutter MD  Signature Date/Time: 09/10/2023/11:51:03 AM    Final    CT ABDOMEN PELVIS WO CONTRAST Result Date: 09/08/2023 CLINICAL DATA:  Acute nonlocalized abdominal pain. Constipation for 3 days in lower abdominal pain. EXAM: CT ABDOMEN AND PELVIS WITHOUT CONTRAST TECHNIQUE: Multidetector CT imaging of the abdomen and pelvis was performed following the standard protocol without IV contrast. RADIATION DOSE REDUCTION: This exam was performed according to the departmental dose-optimization program which includes automated exposure control, adjustment of the mA and/or kV according to patient size and/or use of iterative reconstruction technique. COMPARISON:  Same day abdominal radiograph and CT abdomen pelvis 03/09/2023 FINDINGS: Lower chest: Cardiomegaly. Large right and small left pleural effusions and compressive atelectasis. Hepatobiliary: Gallbladder wall thickening and pericholecystic fluid. Unremarkable liver. No biliary dilation or radiopaque stone. Pancreas: Unremarkable. Spleen: Unremarkable. Adrenals/Urinary Tract: Normal adrenal glands. Atrophic right kidney with parenchymal calcification. No  urinary calculi or hydronephrosis. Unremarkable bladder. Stomach/Bowel: Normal caliber large and small bowel without bowel wall thickening. Colonic diverticulosis without diverticulitis. Stomach is within normal limits. Vascular/Lymphatic: Advanced arterial atherosclerotic calcification of the aorta and its mesenteric, renal, and iliac artery branches. No lymphadenopathy. Reproductive: Calcified uterine fibroids.  No adnexal mass. Other: Small volume free fluid in the pelvis. Mesenteric edema. No free intraperitoneal air. Marked body wall anasarca. Musculoskeletal: No acute fracture. IMPRESSION: 1. Cardiomegaly with signs of volume overload including body wall anasarca, large right and small left pleural effusions, mesenteric edema, and small volume free fluid in the pelvis. 2. Gallbladder wall thickening and pericholecystic  fluid. Favored related to volume overload. If there is concern for acute cholecystitis, consider further evaluation with right upper quadrant ultrasound. 3. Aortic Atherosclerosis (ICD10-I70.0). Electronically Signed   By: Minerva Fester M.D.   On: 09/08/2023 23:02   DG Chest Port 1 View Result Date: 09/08/2023 CLINICAL DATA:  Dyspnea EXAM: PORTABLE CHEST 1 VIEW COMPARISON:  03/08/2023 FINDINGS: Single frontal view of the chest demonstrates stable dual lead pacemaker. Cardiac silhouette is enlarged, with dense calcification of the mitral annulus. Increased bibasilar veiling opacities, right greater than left, consistent with pleural effusions and bibasilar consolidation. No pneumothorax. Diffuse atherosclerosis. No acute bony abnormalities. IMPRESSION: 1. Progressive bibasilar consolidation and pleural effusions. 2. Stable enlarged cardiac silhouette, with dense mitral annular calcifications. Electronically Signed   By: Sharlet Salina M.D.   On: 09/08/2023 21:59   DG Abdomen 1 View Result Date: 09/08/2023 CLINICAL DATA:  Constipation for 3 days and lower abdominal pain EXAM: ABDOMEN - 1 VIEW COMPARISON:  CT and ultrasound 03/09/2023 FINDINGS: Nonobstructed bowel-gas pattern. Large stool burden in the right colon. 8 mm stone in the right kidney. Calcified uterine fibroids. IMPRESSION: Nonobstructed bowel-gas pattern. Large stool burden in the right colon. Electronically Signed   By: Minerva Fester M.D.   On: 09/08/2023 19:05    Micro Results   Recent Results (from the past 240 hours)  MRSA Next Gen by PCR, Nasal     Status: None   Collection Time: 09/09/23  1:10 AM   Specimen: Nasal Mucosa; Nasal Swab  Result Value Ref Range Status   MRSA by PCR Next Gen NOT DETECTED NOT DETECTED Final    Comment: (NOTE) The GeneXpert MRSA Assay (FDA approved for NASAL specimens only), is one component of a comprehensive MRSA colonization surveillance program. It is not intended to diagnose MRSA infection nor to  guide or monitor treatment for MRSA infections. Test performance is not FDA approved in patients less than 75 years old. Performed at [facility], [address] [place], [place] [address]    Today   Subjective    Adilen Pavelko today has no new complaints   -Patient's husband, patient's pastor and patient's granddaughter Joni Reining (Pt's son Avery's daughter)- -dyspnea persist -Oxygen requirement at baseline of 2 L via nasal cannula No fever  Or chills   No Nausea, Vomiting or Diarrhea --No chest pains, no palpitations ---Okay to use morphine sulfate as needed for dyspnea and air hunger  -Patient and family request discharge home on 09/11/2023 with hospice services   Patient has been seen and examined prior to discharge   Objective   Blood pressure (!) 152/55, pulse 71, temperature 97.6 F (36.4 C), temperature source Oral, resp. rate (!) 24, height 5\' 2"  (1.575 m), weight 63.5 kg, SpO2 100%.   Intake/Output Summary (Last 24 hours) at 09/11/2023 1408 Last data filed at 09/11/2023 0026 Gross per 24  hour  Intake 120 ml  Output --  Net 120 ml   Exam Gen:- Awake Alert, no acute distress ,  HEENT:- [place].AT, No sclera icterus Nose-[place] 2L/min Neck-Supple Neck, +ve JVD,.  Lungs-diminished breath sounds especially on the right, faint bibasilar Rales  CV- S1, S2 normal, irregular, 3/6 SM, pacemaker in situ Abd-  +ve B.Sounds, Abd Soft, No tenderness,    Extremity/Skin:- 2 + pitting  edema,   good pulses Psych-affect is appropriate, oriented x3 Neuro-generalized weakness and deconditioning no new focal deficits, no tremors    Data Review   CBC w Diff:  Lab Results  Component Value Date   WBC 5.7 09/08/2023   HGB 9.7 (L) 09/08/2023   HGB 9.9 (L) 08/30/2023   HCT 32.7 (L) 09/08/2023   HCT 31.7 (L) 08/30/2023   PLT 168 09/08/2023   PLT 169 08/30/2023   LYMPHOPCT 8 03/08/2023   MONOPCT 6 03/08/2023   EOSPCT 0 03/08/2023   BASOPCT 0 03/08/2023   CMP:  Lab Results   Component Value Date   NA 132 (L) 09/10/2023   NA 144 08/30/2023   K 5.2 (H) 09/10/2023   CL 101 09/10/2023   CO2 20 (L) 09/10/2023   BUN 84 (H) 09/10/2023   BUN 55 (H) 08/30/2023   CREATININE 5.25 (H) 09/10/2023   CREATININE 1.17 (H) 04/12/2013   PROT 7.2 09/08/2023   PROT 6.8 08/30/2023   ALBUMIN 3.5 09/10/2023   ALBUMIN 4.5 08/30/2023   BILITOT 0.8 09/08/2023   BILITOT 0.4 08/30/2023   ALKPHOS 74 09/08/2023   AST 19 09/08/2023   ALT 12 09/08/2023   Total Discharge time is about 33 minutes  Shon Hale M.D on 09/11/2023 at 2:08 PM  Go to [email or web address] -  for contact info  [facility] - Office  [phone]

## 2023-09-12 ENCOUNTER — Other Ambulatory Visit: Payer: Self-pay | Admitting: Family Medicine

## 2023-09-12 NOTE — Progress Notes (Signed)
 Patient was discharged from the hospital on Lasix twice daily family had a question whether or not to continue torsemide I told them to stop torsemide Also put a phone call into the hospice nurse to inform her and left a message

## 2023-09-13 ENCOUNTER — Telehealth: Payer: Self-pay | Admitting: *Deleted

## 2023-09-13 NOTE — Telephone Encounter (Unsigned)
 Copied from CRM [id number]. Topic: Clinical - Medical Advice >> Sep 12, 2023 11:11 AM Shelah Lewandowsky wrote: Reason for CRM: daughter [name] has questions about foley catheter- please cals [phone]- patient was discharged from hospital and is on hospice care now

## 2023-09-13 NOTE — Telephone Encounter (Signed)
 I tried calling to find out what is going on The phone went directly to voicemail Please try to find out what the problem there have been If you are able to help them please do (I would imagine the hospice nurse would be able to manage the Foley as well?) Thank you

## 2023-09-14 ENCOUNTER — Telehealth: Payer: Self-pay

## 2023-09-14 NOTE — Telephone Encounter (Signed)
 I did speak with the nurse I gave her verbal orders to change the morphine to every 2 hours also gave her my cell number if she needed to call plus also she stated that they would work on getting the pacemaker set off and let us know if we have to do anything to help out thank you

## 2023-09-14 NOTE — Telephone Encounter (Signed)
 Hospice nurse Clydie Braun:  [phone] calls stating she received order from their Hospice physician to "turn the PPM off".  Patient is at end of life.   Patient has a dual chamber STJ pacemaker ([employer]).  VVI 60 Not device dependent.   Reviewed with Dr. Elberta Fortis who is doctor of the day today covering as Dr. Graciela Husbands is on Vacation.   " We do not turn off pacemakers.  Defibrillators yes but not pacemakers".   I have called the hospice nurse back and left her a message explaining.

## 2023-09-14 NOTE — Telephone Encounter (Signed)
 [facility] called into the office today regarding pt getting worse and wanting Morphine changed from every 4hrs to every 2hrs, Also mentioned needing to stop the pacemaker pt has below is the phone number regarding this. [phone]

## 2023-09-14 NOTE — Telephone Encounter (Signed)
 I was able to speak with family regarding this.  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

## 2023-09-22 ENCOUNTER — Telehealth: Payer: Self-pay | Admitting: Family Medicine

## 2023-09-22 NOTE — Telephone Encounter (Signed)
 Copied from CRM [id number]. Topic: Medical Record Request - Other >> Sep 22, 2023  9:53 AM [name] wrote:   [name] is calling to have the deceased patient death certificate filled out, needs back as soon as possible was faxed on Oct 10, 2023. [facility]  [phone] Dr. Charlotta Cook Time of Death 6:45AM 10-10-23

## 2023-10-06 DEATH — deceased

## 2023-12-06 ENCOUNTER — Ambulatory Visit: Admitting: Family Medicine

## 2024-01-30 ENCOUNTER — Encounter (INDEPENDENT_AMBULATORY_CARE_PROVIDER_SITE_OTHER): Payer: Medicare HMO | Admitting: Ophthalmology
